# Patient Record
Sex: Male | Born: 1935 | Race: Black or African American | Hispanic: No | Smoking: Current every day smoker
Health system: Southern US, Community
[De-identification: ages and names within clinical notes are randomized; demographics above are authoritative.]

## PROBLEM LIST (undated history)

## (undated) DIAGNOSIS — E119 Type 2 diabetes mellitus without complications: Secondary | ICD-10-CM

## (undated) DIAGNOSIS — N183 Chronic kidney disease, stage 3 unspecified: Secondary | ICD-10-CM

## (undated) DIAGNOSIS — C61 Malignant neoplasm of prostate: Secondary | ICD-10-CM

## (undated) DIAGNOSIS — IMO0001 Reserved for inherently not codable concepts without codable children: Secondary | ICD-10-CM

## (undated) DIAGNOSIS — I219 Acute myocardial infarction, unspecified: Secondary | ICD-10-CM

## (undated) DIAGNOSIS — K219 Gastro-esophageal reflux disease without esophagitis: Secondary | ICD-10-CM

## (undated) DIAGNOSIS — I10 Essential (primary) hypertension: Secondary | ICD-10-CM

## (undated) DIAGNOSIS — I739 Peripheral vascular disease, unspecified: Secondary | ICD-10-CM

## (undated) DIAGNOSIS — E785 Hyperlipidemia, unspecified: Secondary | ICD-10-CM

## (undated) DIAGNOSIS — D649 Anemia, unspecified: Secondary | ICD-10-CM

## (undated) DIAGNOSIS — I509 Heart failure, unspecified: Secondary | ICD-10-CM

## (undated) DIAGNOSIS — R0603 Acute respiratory distress: Secondary | ICD-10-CM

## (undated) DIAGNOSIS — F319 Bipolar disorder, unspecified: Secondary | ICD-10-CM

## (undated) DIAGNOSIS — K922 Gastrointestinal hemorrhage, unspecified: Secondary | ICD-10-CM

## (undated) DIAGNOSIS — I2510 Atherosclerotic heart disease of native coronary artery without angina pectoris: Secondary | ICD-10-CM

## (undated) DIAGNOSIS — I161 Hypertensive emergency: Secondary | ICD-10-CM

## (undated) DIAGNOSIS — J811 Chronic pulmonary edema: Secondary | ICD-10-CM

## (undated) DIAGNOSIS — I639 Cerebral infarction, unspecified: Secondary | ICD-10-CM

## (undated) DIAGNOSIS — I4891 Unspecified atrial fibrillation: Secondary | ICD-10-CM

## (undated) HISTORY — DX: Gastro-esophageal reflux disease without esophagitis: K21.9

## (undated) HISTORY — DX: Malignant neoplasm of prostate: C61

## (undated) HISTORY — DX: Essential (primary) hypertension: I10

## (undated) HISTORY — DX: Type 2 diabetes mellitus without complications: E11.9

## (undated) HISTORY — DX: Acute myocardial infarction, unspecified: I21.9

## (undated) HISTORY — DX: Bipolar disorder, unspecified: F31.9

## (undated) HISTORY — DX: Hyperlipidemia, unspecified: E78.5

## (undated) HISTORY — DX: Atherosclerotic heart disease of native coronary artery without angina pectoris: I25.10

## (undated) HISTORY — DX: Peripheral vascular disease, unspecified: I73.9

---

## 2001-12-28 HISTORY — PX: FEMORAL BYPASS: SHX50

## 2003-12-07 ENCOUNTER — Inpatient Hospital Stay (HOSPITAL_COMMUNITY): Admission: EM | Admit: 2003-12-07 | Discharge: 2003-12-12 | Payer: Self-pay | Admitting: Emergency Medicine

## 2003-12-07 ENCOUNTER — Encounter: Payer: Self-pay | Admitting: Cardiology

## 2003-12-20 ENCOUNTER — Inpatient Hospital Stay (HOSPITAL_COMMUNITY): Admission: EM | Admit: 2003-12-20 | Discharge: 2003-12-24 | Payer: Self-pay | Admitting: Emergency Medicine

## 2004-12-08 ENCOUNTER — Emergency Department (HOSPITAL_COMMUNITY): Admission: EM | Admit: 2004-12-08 | Discharge: 2004-12-08 | Payer: Self-pay | Admitting: Emergency Medicine

## 2005-03-10 ENCOUNTER — Encounter: Admission: RE | Admit: 2005-03-10 | Discharge: 2005-06-08 | Payer: Self-pay | Admitting: Internal Medicine

## 2006-02-17 ENCOUNTER — Encounter: Admission: RE | Admit: 2006-02-17 | Discharge: 2006-05-18 | Payer: Self-pay | Admitting: Internal Medicine

## 2010-02-18 ENCOUNTER — Inpatient Hospital Stay (HOSPITAL_COMMUNITY): Admission: EM | Admit: 2010-02-18 | Discharge: 2010-02-20 | Payer: Self-pay | Admitting: Emergency Medicine

## 2010-02-18 ENCOUNTER — Ambulatory Visit: Payer: Self-pay | Admitting: Cardiology

## 2010-02-19 ENCOUNTER — Encounter: Payer: Self-pay | Admitting: Cardiology

## 2010-02-24 ENCOUNTER — Telehealth: Payer: Self-pay | Admitting: Cardiovascular Disease

## 2010-03-07 ENCOUNTER — Encounter: Payer: Self-pay | Admitting: Cardiovascular Disease

## 2010-03-10 ENCOUNTER — Telehealth: Payer: Self-pay | Admitting: Cardiovascular Disease

## 2010-03-10 DIAGNOSIS — I10 Essential (primary) hypertension: Secondary | ICD-10-CM

## 2010-03-10 DIAGNOSIS — I219 Acute myocardial infarction, unspecified: Secondary | ICD-10-CM | POA: Insufficient documentation

## 2010-03-10 DIAGNOSIS — I739 Peripheral vascular disease, unspecified: Secondary | ICD-10-CM

## 2010-03-10 DIAGNOSIS — E785 Hyperlipidemia, unspecified: Secondary | ICD-10-CM

## 2010-03-10 DIAGNOSIS — I2510 Atherosclerotic heart disease of native coronary artery without angina pectoris: Secondary | ICD-10-CM

## 2010-03-10 DIAGNOSIS — E1169 Type 2 diabetes mellitus with other specified complication: Secondary | ICD-10-CM

## 2010-03-10 DIAGNOSIS — K219 Gastro-esophageal reflux disease without esophagitis: Secondary | ICD-10-CM | POA: Insufficient documentation

## 2010-03-10 DIAGNOSIS — E1129 Type 2 diabetes mellitus with other diabetic kidney complication: Secondary | ICD-10-CM

## 2010-03-10 DIAGNOSIS — Z9861 Coronary angioplasty status: Secondary | ICD-10-CM

## 2010-03-11 ENCOUNTER — Ambulatory Visit: Payer: Self-pay | Admitting: Cardiovascular Disease

## 2010-03-13 ENCOUNTER — Telehealth: Payer: Self-pay | Admitting: Cardiovascular Disease

## 2010-03-18 ENCOUNTER — Encounter: Payer: Self-pay | Admitting: Cardiovascular Disease

## 2010-04-21 ENCOUNTER — Encounter: Payer: Self-pay | Admitting: Cardiovascular Disease

## 2010-05-06 ENCOUNTER — Encounter: Payer: Self-pay | Admitting: Cardiovascular Disease

## 2010-06-04 ENCOUNTER — Encounter: Payer: Self-pay | Admitting: Cardiovascular Disease

## 2011-01-29 NOTE — Progress Notes (Signed)
Summary: Need order for PT  Phone Note From Other Clinic Call back at [phone]   Caller: Reinaldo Berber / Genevieve Norlander Reason for Call: Need Referral Information Request: Talk with Nurse, Talk with Provider Summary of Call: Pt is being followed by [facility] home care and Genevieve Norlander got a report from [facility] stating the patient is unstable and needs PT and Genevieve Norlander  just need an order faxed to them in order to start the process Initial call taken by: Omer Jack,  March 10, 2010 4:22 PM  Follow-up for Phone Call        Rapid referral form for PT completed and faxed.  Follow-up by: Julieta Gutting, RN, BSN,  March 11, 2010 11:29 AM

## 2011-01-29 NOTE — Progress Notes (Signed)
Summary: Records  Phone Note Call from Patient Call back at Home Phone [phone]   Caller: Patient Reason for Call: Talk to Nurse Summary of Call: wants to know if he could get a aid @ home, wants to make sure copy of procedure is sent to PCP in [place] Dr Orson Aloe [phone] ext [phone] Initial call taken by: Migdalia Dk,  February 24, 2010 11:07 AM  Follow-up for Phone Call        I spoke with the pt and he is looking for assistance with housework, heavy lifting and cooking.  I told the pt that the PCP should make this referral is he needs help with his ADLs.  The pt said the PCP is already working on this request.  I also offered the pt information about [facility].  The pt said he would give them a call to see what they offer. Follow-up by: Julieta Gutting, RN, BSN,  February 24, 2010 5:55 PM  Additional Follow-up for Phone Call Additional follow up Details #1::        I obtained the fax # for Dr Orson Aloe [phone].  Hospital records faxed to the [facility] per pt request. Julieta Gutting, RN, BSN  February 25, 2010 12:11 PM

## 2011-01-29 NOTE — Progress Notes (Signed)
Summary: medication questions  Phone Note Call from Patient Call back at Home Phone [phone]   Caller: Patient Reason for Call: Talk to Nurse Summary of Call: pt needs to how long he needs to meds. [facility]. OK to leave message on machine if not home. Initial call taken by: Edman Circle,  March 13, 2010 2:53 PM  Follow-up for Phone Call        talked with pt--he asked how long he is to continue Plavix--I told pt he needs to continue Plavix for at least one year  and discuss with Dr Excell Seltzer at time of appt in one year about continuing Plavix beyond one year--I told pt not to stop Plavix without discussing with Dr Excell Seltzer

## 2011-01-29 NOTE — Assessment & Plan Note (Signed)
Summary: EPH   Visit Type:  Post-hospital  CC:  No complaints.  History of Present Illness: 75 year-old male with coronary and peripheral arterial disease, presenting for hospital f/u evaluation. He was hospitalized in February 2011 with a NSTEMI and underwent PCI of the distal left circumflex swith an Endeavor DES. He has done well since hospital discharge and denies any further chest pain. Denies dyspnea, orthopnea, or PND. He has an upcoming appt at the [facility], where he receives most of his medical care.  Current Medications (verified): 1)  Opt Ammonium Lactate 12% Lotion .... Qpply Once A Day 2)  Aspirin 81 Mg Tbec (Aspirin) .... Take One Tablet By Mouth Daily 3)  Cilostazol 50 Mg Tabs (Cilostazol) .... Take 1 Tablet By Mouth Two Times A Day 4)  Vitamin B-12 1000 Mcg Tabs (Cyanocobalamin) .... Take 1 Tablet By Mouth Once A Day 5)  Divalproex Sodium 250 Mg Tbec (Divalproex Sodium) .... 6 Tablets Every Day 6)  Finasteride 5 Mg Tabs (Finasteride) .... Take 1 Tablet By Mouth Once A Day 7)  Novolin 70/30 70-30 % Susp (Insulin Isophane & Regular) .... As Directed 8)  Lisinopril 40 Mg Tabs (Lisinopril) .... Take 1/2 Tablet Daily 9)  Daily Multiple Vitamins  Tabs (Multiple Vitamin) .... Take 1 Tablet By Mouth Once A Day 10)  Nitroglycerin 0.4 Mg/hr Pt24 (Nitroglycerin) .... Apply 1 Patch Each Morning and Remove At Bedtime 11)  Omeprazole 20 Mg Tbec (Omeprazole) .... Take 1 Tablet By Mouth Once A Day 12)  Levatol 20 Mg Tabs (Penbutolol Sulfate) .... Take 1 Tablet By Mouth Two Times A Day 13)  Simvastatin 80 Mg Tabs (Simvastatin) .... Take  1/2 Tablet By Mouth Daily At Bedtime 14)  Tamsulosin Hcl 0.4 Mg Caps (Tamsulosin Hcl) .... Take 1 Capsule By Mouth Once A Day 15)  Travatan Z 0.004 % Soln (Travoprost) .... One Drop Each Eye At Bedtime 16)  Plavix 75 Mg Tabs (Clopidogrel Bisulfate) .... Take One Tablet By Mouth Daily  Allergies (verified): 1)  ! Penicillin  Past History:  Past  medical history reviewed for relevance to current acute and chronic problems.  Past Medical History: Current Problems:  HYPERLIPIDEMIA (ICD-272.4) HYPERTENSION (ICD-401.9) CAD (ICD-414.00)Non Q-wave myocardial infarction (with history of coronary artery disease), s/p PCI Feb 2011 with a DES. MYOCARDIAL INFARCTION (ICD-410.90) PVD (ICD-443.9) DM (ICD-250.00) GERD (ICD-530.81) Bipolar disorder Prostate cancer, status post radiation therapy in 2003.   Review of Systems       Positive for right groin pain with ambulation, otherwise negative except as per HPI.  Vital Signs:  Patient profile:   75 year old male Height:      71 inches Weight:      229.50 pounds BMI:     32.12 Pulse rate:   80 / minute Pulse rhythm:   irregular Resp:     18 per minute BP sitting:   128 / 80  (left arm) Cuff size:   large  Vitals Entered By: Vikki Ports (March 11, 2010 9:47 AM)  Physical Exam  General:  Pt is alert and oriented, in no acute distress. HEENT: normal Neck: normal carotid upstrokes without bruits, JVP normal Lungs: CTA CV: RRR without murmur or gallop Abd: soft, NT, positive BS, no bruit, no organomegaly Ext: no clubbing, cyanosis, or edema.Fem pulses 2+= bilateral, pedal pulses diminished. R groin nontender. R radial site intact with positive reverse Allen's Skin: warm and dry without rash    EKG  Procedure date:  03/11/2010  Findings:  NSR with RBBB, T wave abnormality consider inferior ischemia, HR 79 bpm.  Impression & Recommendations:  Problem # 1:  MYOCARDIAL INFARCTION (ICD-410.90) Pt s/p NSTEMI last month, treated with DES in the left circumflex. Recommend continue ASA/Plavix minimum of 12 months. Asked him to increase his ASA dose to 325 mg for the next 2 months, then resume 81 mg long-term. Also asked that he substitute Protonix for Omeprazole because of concern over plavix interaction. He is followed regularly at the [facility], and I advised that I would see him  on a yearly basis so that he has cardiac f/u locally in [place] as well.  His updated medication list for this problem includes:    Aspirin Ec 325 Mg Tbec (Aspirin) .Marland Kitchen... Take one tablet by mouth daily    Cilostazol 50 Mg Tabs (Cilostazol) .Marland Kitchen... Take 1 tablet by mouth two times a day    Lisinopril 40 Mg Tabs (Lisinopril) .Marland Kitchen... Take 1/2 tablet daily    Nitroglycerin 0.4 Mg/hr Pt24 (Nitroglycerin) .Marland Kitchen... Apply 1 patch each morning and remove at bedtime    Levatol 20 Mg Tabs (Penbutolol sulfate) .Marland Kitchen... Take 1 tablet by mouth two times a day    Plavix 75 Mg Tabs (Clopidogrel bisulfate) .Marland Kitchen... Take one tablet by mouth daily  Problem # 2:  HYPERTENSION (ICD-401.9) Controlled on current Rx.  His updated medication list for this problem includes:    Aspirin Ec 325 Mg Tbec (Aspirin) .Marland Kitchen... Take one tablet by mouth daily    Lisinopril 40 Mg Tabs (Lisinopril) .Marland Kitchen... Take 1/2 tablet daily    Levatol 20 Mg Tabs (Penbutolol sulfate) .Marland Kitchen... Take 1 tablet by mouth two times a day  Orders: EKG w/ Interpretation (93000)  BP today: 128/80  Problem # 3:  HYPERLIPIDEMIA (ICD-272.4) followed at the [facility]  His updated medication list for this problem includes:    Simvastatin 80 Mg Tabs (Simvastatin) .Marland Kitchen... Take  1/2 tablet by mouth daily at bedtime  Orders: EKG w/ Interpretation (93000)  Problem # 4:  PVD (ICD-443.9) Right groin pain appears nonvascular in nature. He is followed by vascular surgery at the [place]. No further workup at present.  Patient Instructions: 1)  Your physician has recommended you make the following change in your medication: INCREASE ASPIRIN to 325mg  once a day for 2 MONTHS, then decrease to ASPIRIN 81mg  once a day.  STOP Omeprazole, START Pantoprazole 40mg  once a day 2)  Your physician wants you to follow-up in:   1 YEAR. You will receive a reminder letter in the mail two months in advance. If you don't receive a letter, please call our office to schedule the follow-up appointment.

## 2011-01-29 NOTE — Letter (Signed)
Summary: Home Health Cert and Plan of Care  Home Health Cert and Plan of Care   Imported By: Marylou Mccoy 05/16/2010 14:10:07  _____________________________________________________________________  External Attachment:    Type:   Image     Comment:   External Document

## 2011-01-29 NOTE — Miscellaneous (Signed)
Summary: Revision To Plan of Care   Revision To Plan of Care   Imported By: Roderic Ovens 05/12/2010 13:15:00  _____________________________________________________________________  External Attachment:    Type:   Image     Comment:   External Document

## 2011-01-29 NOTE — Letter (Signed)
Summary: [facility] note  Architectural technologist, Main Office  [address]   [place], [place] [address]   Phone: [phone]  Fax: [phone]        March 11, 2010 MRN: [id number]    [name] [address] [place], [place]  [address]    To Whom it May Concern:  This patient has Acute Coronary Syndrome and had a drug-eluting stent placed on 02/19/10.  This patient requires Plavix for treatment of his ACS.  The patient requires Pantoprazole 40mg  daily to treat his GERD while taking Plavix, because the potential interaction between Omeprazole and Plavix.      Sincerely,   Dr Tonny Bollman   This letter has been electronically signed by your physician.

## 2011-01-29 NOTE — Letter (Signed)
Summary: [facility] Request for Independent Assessment for Personal Care Svcs  [facility] Request for Independent Assessment for Personal Care Svcs   Imported By: Roderic Ovens 04/14/2010 16:20:01  _____________________________________________________________________  External Attachment:    Type:   Image     Comment:   External Document

## 2011-01-29 NOTE — Miscellaneous (Signed)
Summary: [facility] Revision to Plan of Care  [facility] Revision to Plan of Care   Imported By: Roderic Ovens 04/29/2010 13:28:21  _____________________________________________________________________  External Attachment:    Type:   Image     Comment:   External Document

## 2011-01-29 NOTE — Letter (Signed)
Summary: [name] - Home Health Rapid Referral  [facility] Health Rapid Referral   Imported By: Marylou Mccoy 06/24/2010 16:49:26  _____________________________________________________________________  External Attachment:    Type:   Image     Comment:   External Document

## 2011-01-29 NOTE — Miscellaneous (Signed)
Summary: [facility] Revision to Care Plan   [facility] Revision to Care Plan   Imported By: Roderic Ovens 06/18/2010 16:17:17  _____________________________________________________________________  External Attachment:    Type:   Image     Comment:   External Document

## 2011-03-16 ENCOUNTER — Encounter: Payer: Self-pay | Admitting: Cardiovascular Disease

## 2011-03-16 ENCOUNTER — Ambulatory Visit (INDEPENDENT_AMBULATORY_CARE_PROVIDER_SITE_OTHER): Payer: Medicare Other | Admitting: Cardiovascular Disease

## 2011-03-16 DIAGNOSIS — I10 Essential (primary) hypertension: Secondary | ICD-10-CM

## 2011-03-16 DIAGNOSIS — I2510 Atherosclerotic heart disease of native coronary artery without angina pectoris: Secondary | ICD-10-CM

## 2011-03-18 LAB — CARDIAC PANEL(CRET KIN+CKTOT+MB+TROPI)
CK, MB: 17 ng/mL (ref 0.3–4.0)
CK, MB: 28.8 ng/mL (ref 0.3–4.0)
Relative Index: 4.6 — ABNORMAL HIGH (ref 0.0–2.5)
Total CK: 458 U/L — ABNORMAL HIGH (ref 7–232)
Total CK: 629 U/L — ABNORMAL HIGH (ref 7–232)
Troponin I: 26.6 ng/mL (ref 0.00–0.06)
Troponin I: 34.7 ng/mL (ref 0.00–0.06)

## 2011-03-18 LAB — CBC
HCT: 38.6 % — ABNORMAL LOW (ref 39.0–52.0)
HCT: 47.3 % (ref 39.0–52.0)
Hemoglobin: 12.8 g/dL — ABNORMAL LOW (ref 13.0–17.0)
Hemoglobin: 15.9 g/dL (ref 13.0–17.0)
MCHC: 33.3 g/dL (ref 30.0–36.0)
MCHC: 33.5 g/dL (ref 30.0–36.0)
MCV: 86.4 fL (ref 78.0–100.0)
Platelets: 174 10*3/uL (ref 150–400)
Platelets: 175 10*3/uL (ref 150–400)
RBC: 4.44 MIL/uL (ref 4.22–5.81)
RBC: 5.48 MIL/uL (ref 4.22–5.81)
RDW: 14.2 % (ref 11.5–15.5)
RDW: 14.2 % (ref 11.5–15.5)
RDW: 14.4 % (ref 11.5–15.5)
WBC: 8.6 10*3/uL (ref 4.0–10.5)

## 2011-03-18 LAB — POCT I-STAT, CHEM 8
BUN: 10 mg/dL (ref 6–23)
Calcium, Ion: 1.1 mmol/L — ABNORMAL LOW (ref 1.12–1.32)
Chloride: 97 mEq/L (ref 96–112)
Creatinine, Ser: 1.3 mg/dL (ref 0.4–1.5)
Glucose, Bld: 245 mg/dL — ABNORMAL HIGH (ref 70–99)
HCT: 52 % (ref 39.0–52.0)
Hemoglobin: 17.7 g/dL — ABNORMAL HIGH (ref 13.0–17.0)
Potassium: 3.8 mEq/L (ref 3.5–5.1)
Sodium: 136 mEq/L (ref 135–145)
TCO2: 35 mmol/L (ref 0–100)

## 2011-03-18 LAB — GLUCOSE, CAPILLARY
Glucose-Capillary: 150 mg/dL — ABNORMAL HIGH (ref 70–99)
Glucose-Capillary: 211 mg/dL — ABNORMAL HIGH (ref 70–99)
Glucose-Capillary: 230 mg/dL — ABNORMAL HIGH (ref 70–99)
Glucose-Capillary: 258 mg/dL — ABNORMAL HIGH (ref 70–99)
Glucose-Capillary: 264 mg/dL — ABNORMAL HIGH (ref 70–99)
Glucose-Capillary: 295 mg/dL — ABNORMAL HIGH (ref 70–99)

## 2011-03-18 LAB — BASIC METABOLIC PANEL
BUN: 16 mg/dL (ref 6–23)
Chloride: 98 mEq/L (ref 96–112)
Creatinine, Ser: 1.76 mg/dL — ABNORMAL HIGH (ref 0.4–1.5)
GFR calc Af Amer: 55 mL/min — ABNORMAL LOW (ref >60)
GFR calc non Af Amer: 38 mL/min — ABNORMAL LOW (ref >60)
GFR calc non Af Amer: 46 mL/min — ABNORMAL LOW (ref >60)
Glucose, Bld: 181 mg/dL — ABNORMAL HIGH (ref 70–99)
Glucose, Bld: 201 mg/dL — ABNORMAL HIGH (ref 70–99)
Potassium: 3.5 mEq/L (ref 3.5–5.1)
Sodium: 136 mEq/L (ref 135–145)

## 2011-03-18 LAB — TROPONIN I
Troponin I: 44.71 ng/mL (ref 0.00–0.06)
Troponin I: 45.95 ng/mL (ref 0.00–0.06)

## 2011-03-18 LAB — COMPREHENSIVE METABOLIC PANEL
ALT: 20 U/L (ref 0–53)
AST: 107 U/L — ABNORMAL HIGH (ref 0–37)
Calcium: 8.4 mg/dL (ref 8.4–10.5)
GFR calc Af Amer: 57 mL/min — ABNORMAL LOW (ref >60)
Sodium: 136 mEq/L (ref 135–145)
Total Protein: 6.4 g/dL (ref 6.0–8.3)

## 2011-03-18 LAB — DIFFERENTIAL
Basophils Absolute: 0 10*3/uL (ref 0.0–0.1)
Basophils Relative: 0 % (ref 0–1)
Eosinophils Absolute: 0.1 10*3/uL (ref 0.0–0.7)
Eosinophils Relative: 1 % (ref 0–5)
Lymphocytes Relative: 18 % (ref 12–46)
Lymphs Abs: 1.6 10*3/uL (ref 0.7–4.0)
Monocytes Absolute: 1.4 10*3/uL — ABNORMAL HIGH (ref 0.1–1.0)
Monocytes Relative: 16 % — ABNORMAL HIGH (ref 3–12)
Neutro Abs: 5.5 10*3/uL (ref 1.7–7.7)
Neutrophils Relative %: 64 % (ref 43–77)

## 2011-03-18 LAB — CK TOTAL AND CKMB (NOT AT ARMC)
CK, MB: 61 ng/mL (ref 0.3–4.0)
CK, MB: 69.8 ng/mL (ref 0.3–4.0)
Relative Index: 6.4 — ABNORMAL HIGH (ref 0.0–2.5)
Relative Index: 6.6 — ABNORMAL HIGH (ref 0.0–2.5)
Total CK: 1054 U/L — ABNORMAL HIGH (ref 7–232)
Total CK: 955 U/L — ABNORMAL HIGH (ref 7–232)

## 2011-03-18 LAB — PROTIME-INR
INR: 1.16 (ref 0.00–1.49)
Prothrombin Time: 14.7 seconds (ref 11.6–15.2)
Prothrombin Time: 15.8 seconds — ABNORMAL HIGH (ref 11.6–15.2)

## 2011-03-18 LAB — POCT CARDIAC MARKERS
CKMB, poc: 36 ng/mL (ref 1.0–8.0)
Myoglobin, poc: 276 ng/mL (ref 12–200)
Troponin i, poc: 25.3 ng/mL (ref 0.00–0.09)

## 2011-03-18 LAB — MRSA PCR SCREENING: MRSA by PCR: NEGATIVE

## 2011-03-18 LAB — HEPARIN LEVEL (UNFRACTIONATED): Heparin Unfractionated: 0.22 IU/mL — ABNORMAL LOW (ref 0.30–0.70)

## 2011-03-18 LAB — LIPID PANEL
HDL: 57 mg/dL (ref >39)
LDL Cholesterol: 55 mg/dL (ref 0–99)
Triglycerides: 82 mg/dL (ref <150)
VLDL: 16 mg/dL (ref 0–40)

## 2011-03-18 LAB — APTT: aPTT: 95 seconds — ABNORMAL HIGH (ref 24–37)

## 2011-03-18 LAB — TSH: TSH: 0.986 u[IU]/mL (ref 0.350–4.500)

## 2011-03-31 NOTE — Assessment & Plan Note (Signed)
Summary: F1Y/FROM 03/11/10/LWB/PER PT CALL-MJ/SAF   Visit Type:  Follow-up  CC:  pt will call us in regards to  his amlodipine dosage.  History of Present Illness: 75 year-old male with coronary and peripheral arterial disease, presenting for f/u evaluation. He was hospitalized in February 2011 with a NSTEMI and underwent PCI of the distal left circumflex with an Endeavor DES. He receives most of his medical care through the [name].  He denies chest pain or dyspnea. No lightheadedness, syncope, or palpitations. He reports compliance with his medical program. No complaints today.  Current Medications (verified): 1)  Opt Ammonium Lactate 12% Lotion .... Qpply Once A Day 2)  Aspirin Ec 325 Mg Tbec (Aspirin) .... Take One Tablet By Mouth Daily 3)  Cilostazol 50 Mg Tabs (Cilostazol) .... Take 1 Tablet By Mouth Two Times A Day 4)  Vitamin B-12 1000 Mcg Tabs (Cyanocobalamin) .... Take 1 Tablet By Mouth Once A Day 5)  Divalproex Sodium 250 Mg Tbec (Divalproex Sodium) .... 6 Tablets Every Day 6)  Finasteride 5 Mg Tabs (Finasteride) .... Take 1 Tablet By Mouth Once A Day 7)  Novolin 70/30 70-30 % Susp (Insulin Isophane & Regular) .... As Directed 8)  Lisinopril 40 Mg Tabs (Lisinopril) .... Take 1/2 Tablet Daily 9)  Daily Multiple Vitamins  Tabs (Multiple Vitamin) .... Take 1 Tablet By Mouth Once A Day 10)  Nitroglycerin 0.4 Mg/hr Pt24 (Nitroglycerin) .... Apply 1 Patch Each Morning and Remove At Bedtime 11)  Pantoprazole Sodium 40 Mg Tbec (Pantoprazole Sodium) .... Take One Tablet By Mouth Daily 12)  Levatol 20 Mg Tabs (Penbutolol Sulfate) .... Take 1 Tablet By Mouth Two Times A Day 13)  Simvastatin 80 Mg Tabs (Simvastatin) .... Take  1/2 Tablet By Mouth Daily At Bedtime 14)  Tamsulosin Hcl 0.4 Mg Caps (Tamsulosin Hcl) .... Take 1 Capsule By Mouth Once A Day 15)  Travatan Z 0.004 % Soln (Travoprost) .... One Drop Each Eye At Bedtime 16)  Plavix 75 Mg Tabs (Clopidogrel Bisulfate) .... Take One  Tablet By Mouth Daily 17)  Amlodipine .... Take One Tablet By Mouth Daily  Allergies: 1)  ! Penicillin  Past History:  Past medical history reviewed for relevance to current acute and chronic problems.  Past Medical History: Reviewed history from 03/11/2010 and no changes required. Current Problems:  HYPERLIPIDEMIA (ICD-272.4) HYPERTENSION (ICD-401.9) CAD (ICD-414.00)Non Q-wave myocardial infarction (with history of coronary artery disease), s/p PCI Feb 2011 with a DES. MYOCARDIAL INFARCTION (ICD-410.90) PVD (ICD-443.9) DM (ICD-250.00) GERD (ICD-530.81) Bipolar disorder Prostate cancer, status post radiation therapy in 2003.   Review of Systems       Negative except as per HPI   Vital Signs:  Patient profile:   75 year old male Height:      71 inches Weight:      230 pounds BMI:     32.19 Pulse rate:   67 / minute Resp:     18 per minute BP sitting:   125 / 74  (left arm)  Vitals Entered By: Kem Parkinson (March 16, 2011 12:13 PM)  Physical Exam  General:  Pt is alert and oriented, in no acute distress. HEENT: normal Neck: normal carotid upstrokes without bruits, JVP normal Lungs: CTA CV: RRR without murmur or gallop Abd: soft, NT, positive BS, no bruit, no organomegaly Ext: no edema Skin: warm and dry without rash    EKG  Procedure date:  03/16/2011  Findings:      NSR with RBBB  Impression &  Recommendations:  Problem # 1:  CAD (ICD-414.00) Pt stable without angina. He is tolerating antiplatelet Rx without bleeding problems. Continue current medical program.  His updated medication list for this problem includes:    Aspirin Ec 325 Mg Tbec (Aspirin) .Marland Kitchen... Take one tablet by mouth daily    Cilostazol 50 Mg Tabs (Cilostazol) .Marland Kitchen... Take 1 tablet by mouth two times a day    Lisinopril 40 Mg Tabs (Lisinopril) .Marland Kitchen... Take 1/2 tablet daily    Nitroglycerin 0.4 Mg/hr Pt24 (Nitroglycerin) .Marland Kitchen... Apply 1 patch each morning and remove at bedtime    Levatol  20 Mg Tabs (Penbutolol sulfate) .Marland Kitchen... Take 1 tablet by mouth two times a day    Plavix 75 Mg Tabs (Clopidogrel bisulfate) .Marland Kitchen... Take one tablet by mouth daily  Problem # 2:  HYPERTENSION (ICD-401.9) BP controlled.  His updated medication list for this problem includes:    Aspirin Ec 325 Mg Tbec (Aspirin) .Marland Kitchen... Take one tablet by mouth daily    Lisinopril 40 Mg Tabs (Lisinopril) .Marland Kitchen... Take 1/2 tablet daily    Levatol 20 Mg Tabs (Penbutolol sulfate) .Marland Kitchen... Take 1 tablet by mouth two times a day  BP today: 125/74 Prior BP: 128/80 (03/11/2010)  Problem # 3:  HYPERLIPIDEMIA (ICD-272.4) His lipids are followed through the [place]. We need to reduce his simvastatin dose if he is on amlodipine...will followup on this.  His updated medication list for this problem includes:    Simvastatin 80 Mg Tabs (Simvastatin) .Marland Kitchen... Take  1/2 tablet by mouth daily at bedtime  Patient Instructions: 1)  Your physician recommends that you continue on your current medications as directed. Please refer to the Current Medication list given to you today. 2)  Your physician wants you to follow-up in: ONE YEAR  You will receive a reminder letter in the mail two months in advance. If you don't receive a letter, please call our office to schedule the follow-up appointment.  Appended Document: F1Y/FROM 03/11/10/LWB/PER PT CALL-MJ/SAF Lauren - can you let him know he needs to cut simvastatin to 20 mg since he's on amlodipine? I think Shelby Dubin follows him.

## 2011-04-15 ENCOUNTER — Telehealth: Payer: Self-pay | Admitting: Cardiovascular Disease

## 2011-04-15 NOTE — Telephone Encounter (Signed)
I spoke with the pt and he did contact his PCP and discussed his symptoms with them and they did not feel that it was related to his diabetes or timing of insulin dose.  The PCP instructed the pt to contact our office for an appointment.  The pt has noticed for the last month that he develops reflux symptoms every evening after his meal and he takes antacid with relief.  Last Thursday the pt developed CP and diaphoresis mid morning and took antacid.  The pt said his symptoms resolved about an hour later.  The pt does have a supply of NTG but has not used this for symptoms. I tried to arrange an appt for the pt to see the PA on 04/16/11 but this did not work for his schedule.  The pt has an appt on 04/20/11 at 12:00  with Tereso Newcomer PA-C.  The pt was instructed to use NTG for chest pain and go to the ER if he developed worsening in his symptoms.  Pt agreed with plan.

## 2011-04-17 ENCOUNTER — Encounter: Payer: Self-pay | Admitting: Physician Assistant

## 2011-04-20 ENCOUNTER — Encounter: Payer: Self-pay | Admitting: Physician Assistant

## 2011-04-20 ENCOUNTER — Ambulatory Visit (INDEPENDENT_AMBULATORY_CARE_PROVIDER_SITE_OTHER): Payer: Medicare Other | Admitting: Physician Assistant

## 2011-04-20 VITALS — BP 125/63 | HR 64 | Ht 73.0 in | Wt 238.0 lb

## 2011-04-20 DIAGNOSIS — I2510 Atherosclerotic heart disease of native coronary artery without angina pectoris: Secondary | ICD-10-CM

## 2011-04-20 DIAGNOSIS — E785 Hyperlipidemia, unspecified: Secondary | ICD-10-CM

## 2011-04-20 DIAGNOSIS — K219 Gastro-esophageal reflux disease without esophagitis: Secondary | ICD-10-CM

## 2011-04-20 DIAGNOSIS — R079 Chest pain, unspecified: Secondary | ICD-10-CM | POA: Insufficient documentation

## 2011-04-20 MED ORDER — PANTOPRAZOLE SODIUM 40 MG PO TBEC: 40.0000 mg | DELAYED_RELEASE_TABLET | Freq: Two times a day (BID) | ORAL | Status: DC

## 2011-04-20 NOTE — Progress Notes (Signed)
Addended by: Danielle Rankin on: 04/20/2011 02:39 PM   Modules accepted: Orders

## 2011-04-20 NOTE — Assessment & Plan Note (Signed)
Symptoms are somewhat atypical.  He denies exertional symptoms.  They sound more consistent with acid reflux symptoms than anything else.  He also sounds like he had a hypoglycemic episode as well last week.  His ECG is unchanged.  However, he is now off Plavix.  He also is high risk for progressive CAD with continued smoking and diabetes.  I will arrange for him to have a Lexiscan Myoview.  I will increase his Protonix to BID.  He will follow up with Dr. Excell Seltzer in 3 weeks.  If his Myoview is low risk, I suggest he seek further evaluation with GI.

## 2011-04-20 NOTE — Assessment & Plan Note (Signed)
We will have patient check his medications to make sure he is not taking more than Simvastatin 20 mg qhs with concomitant use of amlodipine.

## 2011-04-20 NOTE — Assessment & Plan Note (Signed)
Schedule Myoview as noted.  He is no longer on Plavix.  He has a new generation stent and has completed one year of dual antiplatelet therapy.  He should be ok to remain off Plavix and continue ASA.  I will check with Dr. Excell Seltzer to see if he would rather he stay on Plavix.

## 2011-04-20 NOTE — Progress Notes (Signed)
History of Present Illness: Primary Cardiologist: Dr. Tonny Bollman  [name] is a 75 y.o. male with a h/o CAD, s/p NSTEMI 2/11 treated with an Endeavor DES to the OM2, preserved LVF with EF 50-55%, severe LVH and diastolic dysfunction, inf. HK and LAE by echo in 2/11, PAD, s/p left fem-pop bypass, DM2, HTN, hyperlipidemia and prostate CA, s/p radiation therapy in 2003.  He receives most of his care at the [facility].  He presents for chest pain.  Last week, he took his AM Lantus insulin and sat down to eat.  He developed chest pain, weakness and diaphoresis.  This resolved with eating.  He had checked his sugar an hour before and it was 150.  He did not repeat his sugar.  He took some TUMS that helped his chest pain as well.  He does note a recent h/o chest pain described as gas that resolves with TUMS.  He had an EGD at the [facility] last year and did not require dilatation.  He is on Protonix once daily.  He denies any exertional chest pain or dyspnea.  He has chronic leg pain with walking.  He denies orthopnea or PND.  He has chronic pedal edema without change.  He denies syncope.  Of note, he did stop taking Plavix in March.  He is not aware of these symptoms being related to stopping his Plavix.    Past Medical History  Diagnosis Date  . Hyperlipidemia   . HTN (hypertension)     echo 2/11: EF 50-55%, diast dyfxn, severe LVH, inf HK, LAE  . CAD (coronary artery disease)     a.  NSTEMI treated with PCI Feb 2011 with a DES.(Endeavor);   b. cath 2/11: D1 stents x 2 ok, AV groove CFX occluded with dist AV CFX filled by L-L collats, RCA occluded, OM2 90-95% (treated with PCI)  . MI (myocardial infarction)   . PVD (peripheral vascular disease)   . DM (diabetes mellitus)   . GERD (gastroesophageal reflux disease)   . Bipolar disorder   . Prostate cancer     status post radiation therapy in 2003    Current Outpatient Prescriptions  Medication Sig Dispense Refill  . AMLODIPINE BESYLATE PO  Take one tablet by mouth daily       . ammonium lactate (AMLACTIN) 12 % cream Apply Once a Day       . aspirin 325 MG EC tablet Take 325 mg by mouth daily.        . cilostazol (PLETAL) 50 MG tablet Take 50 mg by mouth 2 (two) times daily.        . Cyanocobalamin (VITAMIN B 12 PO) Take 1 tablet by mouth daily. 1000 MCG       . DAILY MULTIPLE VITAMINS PO Take 1 tablet by mouth once a day.       . divalproex (DEPAKOTE) 250 MG EC tablet 6 tablets everyday.       . finasteride (PROSCAR) 5 MG tablet Take 5 mg by mouth daily.        . insulin NPH-insulin regular (NOVOLIN 70/30) (70-30) 100 UNIT/ML injection As Directed       . lisinopril (PRINIVIL,ZESTRIL) 40 MG tablet Take half a tablet daily.       . nitroGLYCERIN (NITRODUR - DOSED IN MG/24 HR) 0.4 mg/hr Apply 1 patch each morning and remove at bedtime.       . pantoprazole (PROTONIX) 40 MG tablet Take 1 tablet (40 mg total)  by mouth 2 (two) times daily.  60 tablet  11  . penbutolol (LEVATOL) 20 MG TABS Take 20 mg by mouth 2 (two) times daily.        . simvastatin (ZOCOR) 80 MG tablet Take half a tablet by mouth daily at bedtime.       . Tamsulosin HCl (FLOMAX) 0.4 MG CAPS Take 0.4 mg by mouth daily.        . travoprost, benzalkonium, (TRAVATAN) 0.004 % ophthalmic solution Place 1 drop into both eyes at bedtime.        Marland Kitchen DISCONTD: pantoprazole (PROTONIX) 40 MG tablet Take 40 mg by mouth daily.        . clopidogrel (PLAVIX) 75 MG tablet Take 75 mg by mouth daily.          Allergies  Allergen Reactions  . Penicillins     REACTION: hands and feet edema   History  Substance Use Topics  . Smoking status: Current Everyday Smoker -- 0.5 packs/day for 50 years    Types: Cigarettes  . Smokeless tobacco: Not on file   Comment: He has a 50-pack-year hx of tobacco abuse. Currently, smoking about half a pack a day.  . Alcohol Use: Yes     Previously drank heavily, and now has a drink every other day or so.    ROS:  Please see HPI.  No fevers or  chills.  No melena or hematochezia.  No dysphagia.  No nausea or vomiting.  All other systems reviewed and negative.  Vital Signs: BP 125/63  Pulse 64  Ht 6\' 1"  (1.854 m)  Wt 238 lb (107.956 kg)  BMI 31.40 kg/m2  PHYSICAL EXAM: Well nourished, well developed, in no acute distress HEENT: normal Neck: no JVD Cardiac:  normal S1, S2; RRR; no murmur Lungs:  Decreased breath sounds bilaterally, no wheezing, rhonchi or rales Abd: soft, nontender, no hepatomegaly Ext: 1+ bilateral edema Skin: warm and dry Neuro:  CNs 2-12 intact, no focal abnormalities noted  EKG:  NSR, HR 68, RBBB, no significant change since previous tracing  ASSESSMENT AND PLAN:

## 2011-04-20 NOTE — Patient Instructions (Addendum)
Your physician has requested that you have a lexiscan myoview 786.50. For further information please visit [email or web address] Please follow instruction sheet, as given.  Your physician has recommended you make the following change in your medication: INCREASE PROTON IX 40 TO TWICE DAILY  PLEASE CALL OUR OFFICE TO LET us KNOW WHAT THE DOSE OF YOUR SIMVASTATIN AND AMLODIPINE ARE; YOU CANNOT TAKE MORE THAN 20 MG OF SIMVASTATIN SINCE YOU ARE AMLODIPINE [phone] SCOTT WEAVER, PA-C   Your physician recommends that you schedule a follow-up appointment in: 3 WEEKS WITH DR. Excell Seltzer

## 2011-04-20 NOTE — Assessment & Plan Note (Signed)
As noted, increase Protonix to BID.

## 2011-04-28 ENCOUNTER — Ambulatory Visit (HOSPITAL_COMMUNITY): Payer: PRIVATE HEALTH INSURANCE | Attending: Cardiovascular Disease | Admitting: Radiology

## 2011-04-28 DIAGNOSIS — R0602 Shortness of breath: Secondary | ICD-10-CM

## 2011-04-28 DIAGNOSIS — R0789 Other chest pain: Secondary | ICD-10-CM

## 2011-04-28 DIAGNOSIS — R079 Chest pain, unspecified: Secondary | ICD-10-CM | POA: Insufficient documentation

## 2011-04-28 DIAGNOSIS — I2510 Atherosclerotic heart disease of native coronary artery without angina pectoris: Secondary | ICD-10-CM | POA: Insufficient documentation

## 2011-04-28 MED ORDER — TECHNETIUM TC 99M TETROFOSMIN IV KIT
33.0000 | PACK | Freq: Once | INTRAVENOUS | Status: AC | PRN
  Administered 2011-04-28: 33 via INTRAVENOUS

## 2011-04-28 MED ORDER — TECHNETIUM TC 99M TETROFOSMIN IV KIT
11.0000 | PACK | Freq: Once | INTRAVENOUS | Status: AC | PRN
  Administered 2011-04-28: 11 via INTRAVENOUS

## 2011-04-28 MED ORDER — REGADENOSON 0.4 MG/5ML IV SOLN
0.4000 mg | Freq: Once | INTRAVENOUS | Status: AC
  Administered 2011-04-28: 0.4 mg via INTRAVENOUS

## 2011-04-28 NOTE — Progress Notes (Signed)
[facility] SITE 3 NUCLEAR MED [address] [place] [address] [phone]  Cardiology Nuclear Med Study  [name] is a 75 y.o. male [id number] 1936-06-08   Nuclear Med Background Indication for Stress Test:  Evaluation for Ischemia, PTCA/Stent Patency History:  2/11 NSTEMI>PTCA/Stent-OM-2, 2/11 Echo:EF=50-55% Cardiac Risk Factors: Hypertension, IDDM Type 2, Lipids, PVD, Smoker and TIA  Symptoms:  Chest Pressure.  (last date of chest discomfort was about two weeks ago), Diaphoresis, DOE and Fatigue   Nuclear Pre-Procedure Caffeine/Decaff Intake:  None NPO After: 12:00am   Lungs:  Clear.  O2 Sat 95% on RA. IV 0.9% NS with Angio Cath:  20g  IV Site: R Antecubital  IV Started by:  Irean Hong, RN  Chest Size (in):  44 Cup Size: n/a  Height: 6\' 1"  (1.854 m)  Weight:  233 lb (105.688 kg)  BMI:  Body mass index is 30.74 kg/(m^2). Tech Comments:  n/a    Nuclear Med Study 1 or 2 day study: 1 day  Stress Test Type:  Lexiscan  Reading MD: Arvilla Meres, MD  Order Authorizing Provider:  Tonny Bollman, MD  Resting Radionuclide: Technetium 61m Tetrofosmin  Resting Radionuclide Dose: 11 mCi   Stress Radionuclide:  Technetium 61m Tetrofosmin  Stress Radionuclide Dose: 33 mCi           Stress Protocol Rest HR: 66 Stress HR: 83  Rest BP: 133/74 Stress BP: 117/68  Exercise Time (min): n/a METS: n/a   Predicted Max HR: 148 bpm % Max HR: 56.08 bpm Rate Pressure Product: 9711   Dose of Adenosine (mg):  n/a Dose of Lexiscan: 0.4 mg  Dose of Atropine (mg): n/a Dose of Dobutamine: n/a mcg/kg/min (at max HR)  Stress Test Technologist: Duke Salvia, CMA-N  Nuclear Technologist:  Domenic Polite, CNMT     Rest Procedure:  Myocardial perfusion imaging was performed at rest 45 minutes following the intravenous administration of Technetium 59m Tetrofosmin. Rest ECG: RBBB, nonspecific T-wave changes, rare PAC.  Stress Procedure:  The patient  received IV Lexiscan 0.4 mg over 15-seconds.  Technetium 96m Tetrofosmin injected at 30-seconds.  There were no significant changes with Lexiscan, only occasional PAC's and PVC's.  Quantitative spect images were obtained after a 45 minute delay. Stress ECG: No significant ST segment change suggestive of ischemia.  QPS Raw Data Images:  No motion. LV is dilated. Stress Images:  Severely decreased uptake throughout entire inferolateral wall. Mildly decreased uptake in distal anterior wall.  Rest Images:  Severely decreased uptake throughout entire inferolateral wall.  Subtraction (SDS):  Dense inferolateral infarct with trivial peri-infarct ischemia. ? Mild ischemia in distal anterior wall. Transient Ischemic Dilatation (Normal <1.22): 1.0 Lung/Heart Ratio (Normal <0.45):  .30  Quantitative Gated Spect Images QGS EDV:  179 ml QGS ESV:  117 ml QGS cine images:  Dilated LV with inferolateral severe HK/AK QGS EF: 35%  Impression Exercise Capacity:  Lexiscan with no exercise. BP Response:  n/a  Clinical Symptoms:  n/a ECG Impression:  No significant ECG changes with Lexiscan. Comparison with Prior Nuclear Study: No previous nuclear study performed  Overall Impression:  Abnormal Lexiscan Myoview. Dense inferolateral infarct with trivial peri-infarct ischemia. ? Mild ischemia in distal anterior wall.    Signed by Duke Salvia on 04/28/2011 at 12:07 PM.   Arvilla Meres

## 2011-04-29 NOTE — Progress Notes (Signed)
Myoview result was reviewed. The patient has a dense inferior wall infarction with trivial peri-infarct ischemia. 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 He underwent stenting of the left circumflex back at that time. His gated left ventricular ejection fraction is 35% but by echo last year the EF was 50-55%. I would recommend repeating the echo to make sure there has not been a significant decline in his left ventricular systolic function.

## 2011-04-29 NOTE — Progress Notes (Signed)
COPY ROUTED TO DR. COOPER.[name] ° ° °

## 2011-05-01 ENCOUNTER — Telehealth: Payer: Self-pay | Admitting: Physician Assistant

## 2011-05-01 NOTE — Telephone Encounter (Signed)
Mr. Voland needs his LOV, EKG & Stress, I printed out a copy of each and put it in Kim's file with a ROI attached. He will sign the ROI and pick up the records on Monday (05/04/11) morning.

## 2011-05-04 ENCOUNTER — Telehealth: Payer: Self-pay | Admitting: Cardiovascular Disease

## 2011-05-04 NOTE — Telephone Encounter (Signed)
Pt came in and signed 2 ROI forms. Records were faxed to [facility] and to [facility]. 05/04/11 BAB  [facility] : Phone: [phone] Fax: [phone]  [facility]: Phone: [phone] Fax: [phone]

## 2011-05-06 ENCOUNTER — Telehealth: Payer: Self-pay

## 2011-05-06 DIAGNOSIS — I2510 Atherosclerotic heart disease of native coronary artery without angina pectoris: Secondary | ICD-10-CM

## 2011-05-06 DIAGNOSIS — R079 Chest pain, unspecified: Secondary | ICD-10-CM

## 2011-05-06 NOTE — Progress Notes (Signed)
Pt aware of results by phone. The pt will have Echo checked on 05/14/11 at 4:00.

## 2011-05-06 NOTE — Telephone Encounter (Signed)
I spoke with the pt about his myoview results. Per Dr Excell Seltzer he would like the pt to have an Echo performed to further assess the pt's EF.  This has been scheduled on 05/14/11 at 4:00.  The pt would like a copy of his myoview faxed to Dr Nada Boozer at [facility] (phone #[phone] ext [phone]) and [facility] (phone [phone]).  I will call and obtain fax numbers.

## 2011-05-07 NOTE — Telephone Encounter (Signed)
Fax numbers obtained and information sent per pt's request.  [facility] fax #[phone], [facility] fax [phone]

## 2011-05-14 ENCOUNTER — Ambulatory Visit (HOSPITAL_COMMUNITY): Payer: Medicare Other | Attending: Cardiovascular Disease | Admitting: Radiology

## 2011-05-14 DIAGNOSIS — I059 Rheumatic mitral valve disease, unspecified: Secondary | ICD-10-CM | POA: Insufficient documentation

## 2011-05-14 DIAGNOSIS — E119 Type 2 diabetes mellitus without complications: Secondary | ICD-10-CM | POA: Insufficient documentation

## 2011-05-14 DIAGNOSIS — I517 Cardiomegaly: Secondary | ICD-10-CM | POA: Insufficient documentation

## 2011-05-14 DIAGNOSIS — I2510 Atherosclerotic heart disease of native coronary artery without angina pectoris: Secondary | ICD-10-CM | POA: Insufficient documentation

## 2011-05-14 DIAGNOSIS — I10 Essential (primary) hypertension: Secondary | ICD-10-CM | POA: Insufficient documentation

## 2011-05-14 DIAGNOSIS — E785 Hyperlipidemia, unspecified: Secondary | ICD-10-CM | POA: Insufficient documentation

## 2011-05-14 DIAGNOSIS — R079 Chest pain, unspecified: Secondary | ICD-10-CM

## 2011-05-15 NOTE — Discharge Summary (Signed)
[name], [name]                         ACCOUNT NO.:  [id number]   MEDICAL RECORD NO.:  [id number]                   PATIENT TYPE:  INP   LOCATION:  6523                                 FACILITY:  [facility]   PHYSICIAN:  Blanch Media, M.D.             DATE OF BIRTH:  Mar 14, 1936   DATE OF ADMISSION:  12/07/2003  DATE OF DISCHARGE:  12/12/2003                                 DISCHARGE SUMMARY   PRIMARY CARE PHYSICIAN:  Dr. Loree [name] in [place].   CONSULTATIONS:  Mohan N. Sharyn [name], M.D.   DISCHARGE DIAGNOSES:  1. Coronary artery disease.  2. Diabetes.  3. Hypertension.  4. Bipolar disorder.   DISCHARGE MEDICATIONS:  1. Hydrochlorothiazide 25 mg p.o. daily.  2. Aspirin 325 mg p.o. daily.  3. Lopressor 25 mg p.o. b.i.d.  4. Lisinopril 20 mg p.o. b.i.d.  5. Lithium 300 mg b.i.d.  6. NPH Insulin 30 units subcutaneously b.i.d.  7. Norvasc 5 mg daily.  8. Imdur 60 mg daily.  9. Nitrostat.   CONDITION ON DISCHARGE:  Stable.   FOLLOW UP:  Patient has follow up appointment on December 27, 2003 at 2:00  P.M. in the [facility] and patient will call  [facility] to make a follow up appointment with Dr. Sharyn [name].   PROCEDURE:  Cardiac catheterization done on December 11, 2003.  Findings  showed left ventricular inferior wall hypokinesia, good left ventricular  systolic function, ejection fraction 50%, left anterior descending 45 to 50%  sequential stenosis, left circumflex was patent proximally but 100% occluded  distally.  The right coronary artery is 100% occluded beyond its mid  portion.  Stent was attempted but unsuccessful as the lesion was felt to be  chronic and calcified.  The patient received heparin and Integrilin during  the procedure and tolerated it well.  There were no complications.   CONSULTATIONS:  Cardiology, Dr. Eduardo Osier. [name].   HISTORY OF PRESENT ILLNESS:  Briefly, this is a 75 year old African-American  male  with past medical history significant for diabetes, hypertension,  bipolar disorder and prostate cancer who presented to the emergency  department with chest pain for one day.  He reported the chest pain as  coming on intermittently lasting approximately 30 minutes each time and  radiating down his left arm.  Patient denies shortness of breath or  diaphoresis.  He denies nausea or vomiting.  Initially the pain woke him up  from sleep.  He claimed it was indigestion.   ALLERGIES:  PENICILLIN.   PAST MEDICAL HISTORY:  1. Hypertension.  2. Diabetes.  3. History of prostate cancer, status post x-ray therapy in 2003.  4. 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  5. Bipolar disorder.   MEDICATIONS ON ADMISSION:  1. Aspirin 325 mg p.o. daily.  2. Lithium 300 mg b.i.d.  3. Metoprolol 25 mg 1.5 daily.  4. Nitroglycerin PRN.  5. Hydrochlorothiazide 25 mg  one-half tablet q.a.m.  6. Lisinopril 20 mg b.i.d.  7. Insulin NPH 40 units b.i.d.  8. Pentoxifylline 400 mg t.i.d.   SOCIAL HISTORY:  The patient is a current smoker, one to two packs per day X  43 years.  Patient reports drinking one pint of alcohol daily.  Denies drug  use.  Patient is currently divorced, is a retired Engineer, manufacturing systems.  Patient  has Medicaid and social security, is on disability for chronic back pain.  Lives in [place] over the last month after moving from [place] once  he retired.  Mother and father died of unknown causes.  He has four healthy  brothers and sisters.  He has four children, one with lupus and  hypertension.   REVIEW OF SYMPTOMS:  As per history of present illness.   PHYSICAL EXAMINATION:  VITAL SIGNS:  On admission the pulse was 60, blood  pressure 161/83, temperature 98.3, respirations 24, oxygen saturation 99% on  2 liters, 97% on room air.  GENERAL:  The patient is sitting up in bed in no acute distress.  HEENT:  Pupils equal, round, reactive to light and accommodation.   Throat is  clear without erythema.  NECK:  Supple.  LUNGS:  Clear to auscultation bilaterally.  No rales, rhonchi or wheezes.  CARDIOVASCULAR:  Regular rate and rhythm with no murmurs, rubs or gallops.  GASTROINTESTINAL:  Soft, obese, positive bowel sounds.  No organomegaly.  EXTREMITIES:  Patient has trace edema bilateral lower extremities.  PSYCHIATRIC:  Appropriate affect.  Pleasant.   LABORATORY DATA:  On admission sodium is 140, potassium 3.7, chloride 103,  cO2 32, BUN 8, creatinine 1.1, glucose 107.  White blood cell count 9.0,  hemoglobin 13.7, platelet count 327,000, ANC 6.0, MCV 86.  Pro Time 12.9,  INR 1.0.  Cardiac enzymes in the emergency department myoglobin 87, CK-MB  1.2, troponin-I less than 0.5.  Chest x-ray showed questionable early  congestive heart failure.  Electrocardiogram showed right bundle branch  block, some T wave inversions in V4, V5, V6 and left bundle branch block.   HOSPITAL COURSE:  The patient was admitted to a telemetry bed, was kept on  oxygen, kept on aspirin, Metoprolol twice daily, lithium, his ACE inhibitor,  insulin at 20 units q.h.s., Lantus in sliding scale dosing, his diuretic,  hydrochlorothiazide, and we obtained a 2-dimensional echocardiogram and  cardiac enzymes X3, TSH, urine drug screen and lithium level.   PROBLEM #1:  CORONARY ARTERY DISEASE:  The echocardiogram showed normal left ventricular systolic function with  severe hypokinesis to akinesis in the inferior posterior wall, an ejection  fraction was 55% to 65%.  Mild to moderate dilation of left atrium, mild  right ventricular hypertrophy and mild dilatation of the right atrium.  Cardiology was consulted and Dr. Sharyn [name] came to see the patient.  They  discussed his options.  Initially the patient did not want to go to  catheterization despite Dr. Annitta [name] recommendation that this be done and the patient opted for the Cardiolite study instead.  However, when the  patient was  picked up to do his Cardiolite study on day #2 of hospital  admission he had not been made NPO and was therefore unable to have the  study done at which time the patient decided that he would go ahead with the  cardiac catheterization.  The patient remained in the hospital until Dr.  Sharyn [name] had a place on his schedule to perform catheterization.  The results  of the  catheterization were as dictated above.  A stent was unable to be  placed and Dr. Sharyn [name] suggested that the patient speak with CVTS for  possible coronary artery bypass grafting.  The patient was unwilling to  remain in the hospital long enough to have this discussion with  cardiovascular/thoracic surgery and decided he would rather be discharged  and be seen as an outpatient to discuss his options.  He remained chest-pain-  free throughout his hospitalization.  His cardiac enzymes were CK-MB 3.5,  then 2.2, then 1.4 and troponin was 0.23, 0.22 and 0.19.  The patient was  counseled on all his options and explained the risks involved and determined  that he would rather leave and discuss his options as an outpatient.  Therefore, the patient will make a follow up appointment with Dr. Sharyn [name]  when he is ready and we will see him as an outpatient in the [facility] for follow up.   PROBLEM #2:  DIABETES:  The patient's blood sugars were fairly well  controlled in the hospital.  We increased his Lantus from 20 to 22 units  q.h.s. for discharge for slightly better control.  However, the patient  takes NPH Insulin at home and was unwilling to change his regimen so he was  discharged with only NPH Insulin 30 units injected twice daily.  The patient  has a sliding scale that he uses at home which he will continue.  His  diabetes care manager spoke with the patient about his current regimen and  offered him options and suggestions.  He will have this followed as an  outpatient.   PROBLEM #3:  HYPERTENSION:   The patient's blood pressure was well controlled  throughout his hospitalization, 120's/60's to 70;'s.  He is discharged on  his home regimen of antihypertensives and included Norvasc.   PROBLEM #4:  BIPOLAR DISORDER:  The patient was on lithium throughout his  hospitalization and did not experience any problems with this, although his  lithium levels were slightly lower than normal.  We suggested the patient  may want to follow up with an outpatient psychiatrist since this has not  been done in a very long time.   PROBLEM #5:  TOBACCO ABUSE:  Counseled the patient on the need for smoking  cessation, however, he refused to change his habits at this time.  We will  continue to talk with him about this as an outpatient.   PROBLEM #6:  ALCOHOL ABUSE:  The patient reports heavy drinking for the last  15 years.  He is now retired and living in [place] with a girlfriend.  We talked about the benefits of cutting back on alcohol and the patient  acknowledges understanding of this.  We offered counseling therapy if the  patient would like it.  He did not want to use that at this time.   DISCHARGE LABORATORY DATA:  Sodium 133, potassium 3.5, chloride 103, cO2 27,  glucose 247, BUN 10, creatinine 1.3.  CBC with white blood cell count 8.2,  hemoglobin 12.4, hematocrit 38.1, platelet count 285,000.      Lonia Blood, M.D.                         Blanch Media, M.D.    LG/MEDQ  D:  12/13/2003  T:  12/14/2003  Job:  [id number]

## 2011-05-15 NOTE — Discharge Summary (Signed)
[name], [name]                         ACCOUNT NO.:  [id number]   MEDICAL RECORD NO.:  [id number]                   PATIENT TYPE:  INP   LOCATION:  2308                                 FACILITY:  [facility]   PHYSICIAN:  Ileana Roup, M.D.               DATE OF BIRTH:  08-Jan-1936   DATE OF ADMISSION:  12/20/2003  DATE OF DISCHARGE:  12/24/2003                                 DISCHARGE SUMMARY   DISCHARGE DIAGNOSES:  1. Known Q-wave myocardial infarction (with history of coronary artery     disease).  2. Hypertension.  3. Diabetes mellitus.  4. Bipolar disorder.   DISCHARGE MEDICATIONS:  1. Lisinopril 20 mg one p.o. q.12h.  2. Imdur 60 mg one p.o. q.a.m.  3. Toprol XL 50 mg one p.o. daily.  4. Lipitor 20 mg one p.o. daily.  5. Plavix 75 mg one p.o. daily.  6. Aspirin 81 mg p.o. daily.  7. Lithium carbonate 300 mg one p.o. q.12h. (with food).  8. NPH insulin 30 units subcutaneously q.a.m. and 30 units subcutaneously     q.a.m.  9. Nitroglycerin 0.4 mg q.5 minutes (maximum of three tablets) p.r.n., chest     pain.   DISPOSITION/FOLLOWUP:  The patient was discharged to his own home, and he is  to call Dr. Annitta Jersey office to arrange for followup with him in two weeks  from discharge.  The phone numbers for his office have been furnished to the  patient by Dr. Sharyn Lull himself.  The patient [facility].  The pertinent things to be addressed  during this followup visit would be to ensure his lithium levels, good  compliance with his diabetic medication and to ensure that his chest pain is  fully resolved.   CONSULTATIONS:  1. The first major consultation that was sought was with Cardiology, Dr.     Rinaldo Cloud.  2. The second major consultation was with Cardiovascular and Thoracic     Surgery, Dr. Evelene Croon.   PROCEDURES:  The major procedures performed were as follows:  1. On December 22, 2003, arterial  vascular evaluation was done by ABI     studies.  This showed the right side being severely reduced in its flow,     and the left ABI showed a normal exam.  However, wave forms were     narrowed, possibly because of the calcification of the vessels.  2. Chest x-ray done on December 20, 2003, that showed mild cardiomegaly.   HISTORY OF PRESENT ILLNESS:  The patient is a 75 year old, African-American  male with a past medical history significant for diabetes mellitus,  hypertension and ongoing tobacco abuse.  He was in the hospital on the dates  of December 07, 2003, to December 10, 2003, when he had a cardiac  catheterization that showed two-vessel, severe disease.  It was decided at  that time that this patient would benefit primarily from medical management  and, should chest pain redevelop, then CVTS recommendation would be made.  The patient notes, on the day of presentation, he was walking at a local  shopping center when he developed chest pain that was unremitting on  sublingual nitroglycerin, as well as Tums/Rolaids.  He denied any associated  shortness of breath or diaphoresis.  He noted that the chest pain was  radiating down his left arm.  His pain improved slightly with deep  breathing.   ALLERGIES:  THE PATIENT IS ALLERGIC TO PENICILLIN.   PAST MEDICAL HISTORY:  1. Catheterization done in December 2004 showing left ventricular wall     inferior hypokinesis with a normal left ventricular ejection fraction of     50%.  On the left anterior descending artery, 45%-50% stenosis was noted,     while the left circumflex artery had 100% obstruction noted distally.  On     the right coronary artery, 100% obstruction was also noted.  Unsuccessful     stent placement was noted during this cardiac catheterization procedure.  2. History of prostate cancer, status post radiation therapy done in 2003.  3. Status post femoral-popliteal bypass done in 2003.   SOCIAL HISTORY:  The patient  is a current smoker, smoking one to two packs a  day of cigarettes for the past 45 years or so.  The patient is also a  current alcohol user, using 1 pint of beer per day.  He denies any other  drug use.  He is educated through the 12th grade, and he is a retired Careers adviser.  He has Medicaid covering his healthcare and prescription financing.  He also claims VA benefits.  The patient desires a Radio producer,  secondary to disability from chronic lower back pain.  He has recently  immigrated from [place], where he lived before and has a primary care  physician.   REVIEW OF SYSTEMS:  Significant for fatigue, chest pain, paroxysmal  nocturnal dyspnea, edema, diarrhea, dysphagia, joint pain, weakness and  depression.   PHYSICAL EXAMINATION:  VITAL SIGNS:  Pulse 88.  Blood pressure 152/82.  Temperature 98.8.  Respirations 20.  O2 saturation 98% on 2 L via nasal  cannula.  GENERAL:  The patient is lying in bed, in no acute distress.  EYES:  Both pupils are equally reactive to light and accommodation.  ENT:  His throat is not hyperemic.  NECK:  Supple.  No goiter palpable.  RESPIRATORY:  Both lung fields are clear to auscultation.  CARDIOVASCULAR:  Pulse is regular in rate and rhythm.  Heart sounds S1 and  S2 are accompanied by an S3 sound.  GI:  Abdomen is soft, obese, nontender and nondistended, and bowel sounds  are normal.  EXTREMITIES:  He has trace edema palpable bilaterally.   LABORATORY DATA:  Admission labs are as follows:  Sodium 142, potassium 3.7,  chloride 102, bicarbonate 86, BUN 12, creatinine 1.5, glucose 86, hemoglobin  13.3, white cell count 8.6, absolute neutrophils 6.1, platelets 366,  prothrombin time 13 seconds with an INR of 1.0, PTT 32 and magnesium level  1.7.  Point of care cardiac markers that were done showed an elevated  troponin-I at a level of 0.10 noted on one of the three sets; otherwise, myoglobulin and CK-MB fractions were both within normal  limits.  EKG done on  admission showed a right bundle branch block, normal sinus rhythm at a rate  of 63 and ST depression changes noted on leads 2 and aVF.  Additional sets  of cardiac enzymes done when the patient was on the floor showed the first  set with a CK of 224 and CK-MB 19.5 with troponin-I 4.85, the second set  showed a CK of 159, CK-MB of 10.3 and a troponin of 2.82, the third set done  showed a CK of 84, CK-MB 2.0 and troponin-I 1.56.  Hemoglobin A1C showed a  level of 7%.   HOSPITAL COURSE:  1. Severe chest pain; this was most likely a myocardial infarction, based on     the patient's history of coronary artery disease.  With elevated cardiac     enzymes and no profound EKG changes, a diagnosis of non-Q-wave myocardial     infarction was entertained.  A prompt Cardiology consultation was sought,     and Dr. Sharyn Lull was prompt to respond.  He started this patient on a     heparin drip, as well as Integrilin and nitroglycerin.  Along with this,     he also had adjunctive treatment with beta-blockers, aspirin and an ACE     inhibitor.  He further advised [place] that this patient had had an earlier     catheterization, in the month of presentation, and it was not amenable to     percutaneous intervention.  However, he did suggest that the distal     aspect of the circumflex artery could be salvageable via percutaneous     intervention, but he wanted Cardiovascular and Thoracic Surgery to     evaluate this patient for possible coronary artery bypass grafting.     Cardiovascular and Thoracic Surgery consultation was sought, and Dr.     Laneta Simmers responded.  Dr. Laneta Simmers advised that this patient was not a very     good surgical candidate, given that he had significant comorbidity     factors and surgery, itself, would not be able to increase the long-term     mortality and morbidity benefits for this patient.  The patient, himself,     was also reluctant for further percutaneous  intervention and was very     skeptical of having coronary artery bypass grafting done on him.  The     patient was explained the risk/benefit analysis, and he opted not to have     any intervention done at this time.  He was, subsequently, discharged     home on the medications as listed, and he was advised to follow up with     Dr. Rinaldo Cloud as an outpatient for possible reevaluation, should he     reconsider having a percutaneous intervention and probably coronary     artery stenting done on his distal circumflex.  2. Hypertension.  The patient was maintained on the medications that he was     on prior to presentation.  Hypertension treatment was further augmented     by the fact that the patient was put on a nitroglycerin drip that was     later switched over to p.o. nitroglycerin.  The patient's blood pressure     remained in good control throughout his hospitalization stay and he was,    consequently, discharged on the same medications as listed above.  3. Diabetes mellitus.  From his hemoglobin A1C, it was apparent that this     patient had had good control of his blood sugars prior to presentation.     The patient was maintained  on the same insulin doses that he was on as an     outpatient, and treatment was further augmented by initiation of a     sliding scale.  The patient was discharged on the same NPH insulin dose     that he was on prior to presentation.  4. Bipolar disorder.  Throughout his hospitalization stay, the patient did     not display any episodes of acute mania or depressive effect, and he will     be continued on the lithium carbonate that he had been taking as an     outpatient.  Serum lithium levels were drawn several times, due to     impending renal insufficiency that this patient also has, likely as a     result of his atherosclerotic disease, and the dosage titration was done     in accordance, to ensure no toxicity occurred in him.      Zetta Bills,  MD                             Ileana Roup, M.D.    JP/MEDQ  D:  04/03/2004  T:  04/04/2004  Job:  [id number]

## 2011-05-15 NOTE — Cardiovascular Report (Signed)
NAMEHINTON, LUELLEN                         ACCOUNT NO.:  [id number]   MEDICAL RECORD NO.:  [id number]                   PATIENT TYPE:  INP   LOCATION:  4705                                 FACILITY:  [facility]   PHYSICIAN:  Mohan N. Sharyn Lull, M.D.              DATE OF BIRTH:  12/29/1935   DATE OF PROCEDURE:  12/11/2003  DATE OF DISCHARGE:                              CARDIAC CATHETERIZATION   PROCEDURE:  1. Left cardiac catheterization.  2. Selective left and right coronary angiography.  3. Left ventricular angiography via right groin using Judkins technique.  4. Attempted PTCA to 100% occluded right coronary artery without success.   INDICATIONS FOR PROCEDURE:  Mr. Moll is a 75 year old black male with  past medical history significant for coronary artery disease, history of  questionable silent MI 20 years ago, hypertension, insulin-requiring  diabetes mellitus, peripheral vascular disease, tobacco abuse, alcohol  abuse, history of bipolar disorder, history of CA of prostate.  He came to  the ER complaining of retrosternal chest pain off and on since yesterday  noon, grade 10/10, radiating to the left arm.  Took Tums, Rolaids, and  baking soda without relief so decided to come to ER.  The patient denies any  nausea, vomiting, or diaphoresis.  Denies palpitations, lightheadedness, or  syncope.  Denies relation of chest pain to food or breathing.   PAST MEDICAL HISTORY:  As above.   PAST SURGICAL HISTORY:  He had left fem-pop bypass in the past.   ALLERGIES:  PENICILLIN.   MEDICATIONS:  1. Aspirin.  2. Lithium.  3. Metoprolol.  4. Hydrochlorothiazide.  5. Fosinopril.  6. Trental.   SOCIAL HISTORY:  He is divorced, has four children.  Smoked eight cigars per  day for 40+ years.  Drinks one pint of hard liquor daily for 30+ years.  Retired Scientist, physiological.  Born in [place].  Moved here from [place].   FAMILY HISTORY:  Negative for coronary artery disease.   PHYSICAL  EXAMINATION:  GENERAL:  He was alert, awake, oriented x3.  No acute  distress.  Hemodynamically stable.  Sinus brady on the monitor.  HEENT:  Conjunctiva was pink.  NECK:  Supple.  No JVD.  No bruit.  LUNGS:  Clear to auscultation without rhonchi or rales.  CARDIOVASCULAR:  S1, S2 was normal.  There was no S3 gallop or murmur.  ABDOMEN:  Soft.  Bowel sounds were present.  Nontender.  EXTREMITIES:  There is no clubbing, cyanosis.  There was 1+ pedal edema.   LABORATORIES:  EKG showed normal sinus rhythm with right bundle branch block  with secondary ST-T wave changes.  Three sets of cardiac enzymes in the ER  were negative.  Subsequent CPKs were also negative and troponin I was  slightly elevated.  They were 0.23, 0.22, and 0.19.  Hist first CK 92, MB  3.5.  Second set CK 80, MB 2.2.  Third set CK 96,  MB 1.4.   Due to atypical anginal and multiple risk factors and mildly elevated  troponin I, discussed with patient at length regarding left catheterization,  possible PTCA and stenting, its risks, i.e., death, MI, stroke, need for  emergency CABG, local vascular complications, etc. and consented for PCI.   PROCEDURE:  After obtaining the informed consent patient was brought to the  catheterization laboratory and was placed on fluoroscopy table.  Right groin  was prepped and draped in usual fashion.  2% Xylocaine was used for local  anesthesia in right groin.  With the help of thin wall needle a 6-French  arterial sheath was placed.  The sheath was aspirated and flushed.  Next, 6-  French left Judkins catheter was advanced over the wire under fluoroscopic  guidance up to the ascending aorta where it was pulled out.  The catheter  was aspirated and connected to the manifold.  Catheter was further advanced  and engaged into left coronary ostium.  Multiple views of the left system  were taken.  Next, the catheter was disengaged and was pulled out over the  wire and was replaced with 6-French  right Judkins catheter which was  advanced over the wire under fluoroscopic guidance up to the ascending aorta  where it was pulled out.  The catheter was aspirated and connected to the  manifold.  Catheter was further advanced and engaged into right coronary  ostium.  Multiple views of the right system were taken.  Next, the catheter  was disengaged and was pulled out over the wire and was replaced with 6-  French pigtail catheter which was advanced over the wire under fluoroscopic  guidance up to the ascending aorta where it was pulled out.  The catheter  was aspirated and connected to the manifold.  Catheter was further advanced  across the aortic valve into the LV.  LV pressures were recorded.  Next, LV  graphy was done in 30 degree RAO position.  Post angiographic pressures were  recorded from LV and then pullback pressures were recorded from the aorta.  There was no gradient across the aortic valve.  Next, the pigtail catheter  was pulled out over the wire.  Sheaths were aspirated and flushed.   FINDINGS:  LV showed inferior wall hypokinesia, good LV systolic function,  EF of approximately 50%.  Left main was short which was patent.  LAD was  calcified proximally and has 40-50% sequential stenosis.  Diagonal 1 has 60-  70% proximal and mid sequential stenosis.  Vessel is small.  Also, the  lesion appears to be calcified.  Distally, this vessel is very small and  diffusely diseased.  Left circumflex is patent proximally and then it is  100% occluded after giving off large OM1 which was filling by collaterals  from the left system.  The vessel in AV groove is very small and diffusely  diseased.  Distally the vessel is filling by collaterals from the left  system as above.  OM1 is large which has 50-60% mid stenosis beyond the  bifurcation.  The superior branch is smaller as compared to inferior branch. RCA is 100% occluded beyond mid portion which has faint collaterals coming  from  right system.   INTERVENTIONAL PROCEDURE:  Attempted to cross 100% occluded RCA using high-  torque floppy and then __________ and then Cross-It wire with support with  1.5 x 15 mm long Maverick balloon without success.  Lesion was felt to be  chronic and calcified.  The patient did receive weight based heparin and  Integrilin during the procedure.  The patient tolerated procedure well.  There were no complications.  Plan is to maximize the medical management.  The patient does have two vessel coronary artery disease.  His LAD has 40-  50% proximal sequential lesions which does not appear to be critical.  OM1  is large also which has 50-60% lesions which also does not appear to be  critical.  Left circumflex is 100% occluded beyond OM1 which is filling by  the collaterals.  This vessel is diffusely diseased and very small.  RCA  also appears to be chronically occluded and is small vessel.  The patient  has codominant coronary system.  The patient tolerated procedure well.  Plan  is to maximize medical management.  If he continues to have chest pains,  then we will consider getting CVTS consult for possible CABG.  We will  discuss the findings with the patient and the family.                                               Eduardo Osier. Sharyn Lull, M.D.    MNH/MEDQ  D:  12/11/2003  T:  12/11/2003  Job:  [id number]   cc:   Cath Lab

## 2011-07-09 ENCOUNTER — Ambulatory Visit (INDEPENDENT_AMBULATORY_CARE_PROVIDER_SITE_OTHER): Payer: Medicare Other

## 2011-07-09 ENCOUNTER — Inpatient Hospital Stay (INDEPENDENT_AMBULATORY_CARE_PROVIDER_SITE_OTHER)
Admission: RE | Admit: 2011-07-09 | Discharge: 2011-07-09 | Disposition: A | Discharge status: Home / Self Care | Payer: PRIVATE HEALTH INSURANCE | Source: Ambulatory Visit | Attending: Family Medicine | Admitting: Family Medicine

## 2011-07-09 DIAGNOSIS — R609 Edema, unspecified: Secondary | ICD-10-CM

## 2011-07-09 DIAGNOSIS — N19 Unspecified kidney failure: Secondary | ICD-10-CM

## 2011-07-09 LAB — POCT I-STAT, CHEM 8
BUN: 17 mg/dL (ref 6–23)
Calcium, Ion: 1.18 mmol/L (ref 1.12–1.32)
Creatinine, Ser: 1.5 mg/dL — ABNORMAL HIGH (ref 0.50–1.35)
Hemoglobin: 15.3 g/dL (ref 13.0–17.0)
Sodium: 143 mEq/L (ref 135–145)
TCO2: 30 mmol/L (ref 0–100)

## 2011-07-09 LAB — POCT URINALYSIS DIP (DEVICE)
Bilirubin Urine: NEGATIVE
Glucose, UA: 500 mg/dL — AB
Hgb urine dipstick: NEGATIVE
Leukocytes, UA: NEGATIVE
Nitrite: NEGATIVE
Urobilinogen, UA: 4 mg/dL — ABNORMAL HIGH (ref 0.0–1.0)
pH: 6 (ref 5.0–8.0)

## 2011-07-21 ENCOUNTER — Ambulatory Visit: Payer: Medicare Other | Admitting: Cardiovascular Disease

## 2011-07-28 ENCOUNTER — Encounter: Payer: Self-pay | Admitting: Cardiovascular Disease

## 2011-08-04 ENCOUNTER — Telehealth: Payer: Self-pay | Admitting: Cardiovascular Disease

## 2011-08-04 ENCOUNTER — Encounter: Payer: Self-pay | Admitting: Cardiovascular Disease

## 2011-08-04 ENCOUNTER — Ambulatory Visit (INDEPENDENT_AMBULATORY_CARE_PROVIDER_SITE_OTHER): Payer: PRIVATE HEALTH INSURANCE | Admitting: Cardiovascular Disease

## 2011-08-04 VITALS — BP 133/64 | HR 85 | Resp 14 | Ht 72.0 in | Wt 241.0 lb

## 2011-08-04 DIAGNOSIS — I2510 Atherosclerotic heart disease of native coronary artery without angina pectoris: Secondary | ICD-10-CM

## 2011-08-04 DIAGNOSIS — R609 Edema, unspecified: Secondary | ICD-10-CM

## 2011-08-04 DIAGNOSIS — I10 Essential (primary) hypertension: Secondary | ICD-10-CM

## 2011-08-04 DIAGNOSIS — E785 Hyperlipidemia, unspecified: Secondary | ICD-10-CM

## 2011-08-04 NOTE — Telephone Encounter (Signed)
Spoke with pt who gave list of meds and following lab work:  Liver--AST-13 Potassium--3.9, BUN--19,Creat-1.6, Glucose-106 on July 21, 2011 Potassium--4.2,BUN--17, Creat-1.5, Glucose-161 on July 16, 2011  Med list updated

## 2011-08-04 NOTE — Patient Instructions (Signed)
Your physician wants you to follow-up in: 6 months. You will receive a reminder letter in the mail two months in advance. If you don't receive a letter, please call our office to schedule the follow-up appointment.  Please call us with your list of medicines and doses and results of lab work done recently.

## 2011-08-04 NOTE — Telephone Encounter (Signed)
Pt called and has list of medications.  Please call him for this information for Dr. Excell Seltzer.

## 2011-08-20 ENCOUNTER — Encounter: Payer: Self-pay | Admitting: Cardiovascular Disease

## 2011-08-20 DIAGNOSIS — R609 Edema, unspecified: Secondary | ICD-10-CM | POA: Insufficient documentation

## 2011-08-20 NOTE — Telephone Encounter (Signed)
Recommend increase furosemide to 40 mg daily.

## 2011-08-20 NOTE — Progress Notes (Signed)
HPI: 75 year-old male with coronary and peripheral arterial disease, presenting for f/u evaluation. He was hospitalized in February 2011 with a NSTEMI and underwent PCI of the distal left circumflex with an Endeavor DES. He receives most of his medical care through the [facility].  He complains of leg swelling bilaterally over the past 3 months. He notes amlodipine was discontinued one month ago and he was started on a diuretic. He reports improvement in leg swelling since that time. He denies chest pain or pressure, dyspnea, orthopnea, or PND. He had recent labwork done at the [facility] but he did not bring results. He also did not bring meds with him or a list, and he isn't sure of his diuretic dosage.   He had a Myoview stress test in May 2012 showing a gated LVEF of 35% with a dense inferolateral infarct and question of distal anterior ischemia. Follow-up echo showed an LVEF of 55% with basal inferior akinesis and severe concentric LVH.  Outpatient Encounter Prescriptions as of 08/04/2011  Medication Sig Dispense Refill  . ammonium lactate (AMLACTIN) 12 % cream as needed. Apply Once a Day      . aspirin 325 MG EC tablet Take 325 mg by mouth daily.        . cilostazol (PLETAL) 50 MG tablet Take 50 mg by mouth daily.       . Cyanocobalamin (VITAMIN B 12 PO) Take 1 tablet by mouth daily. 1000 MCG       . DAILY MULTIPLE VITAMINS PO Take 1 tablet by mouth once a day.       . divalproex (DEPAKOTE) 250 MG EC tablet 6 tablets everyday.       . finasteride (PROSCAR) 5 MG tablet Take 5 mg by mouth daily.        Marland Kitchen lisinopril (PRINIVIL,ZESTRIL) 40 MG tablet Take half a tablet daily.       . nitroGLYCERIN (NITRODUR - DOSED IN MG/24 HR) 0.4 mg/hr Apply 1 patch each morning and remove at bedtime.       . penbutolol (LEVATOL) 20 MG TABS Take 20 mg by mouth 2 (two) times daily.        . Tamsulosin HCl (FLOMAX) 0.4 MG CAPS Take 0.4 mg by mouth daily.        . travoprost, benzalkonium, (TRAVATAN) 0.004 %  ophthalmic solution Place 1 drop into both eyes at bedtime.        Marland Kitchen DISCONTD: amLODipine (NORVASC) 10 MG tablet Take 10 mg by mouth daily.        Marland Kitchen DISCONTD: AMLODIPINE BESYLATE PO Take one tablet by mouth daily      . DISCONTD: clopidogrel (PLAVIX) 75 MG tablet Take 75 mg by mouth daily.        Marland Kitchen DISCONTD: insulin NPH-insulin regular (NOVOLIN 70/30) (70-30) 100 UNIT/ML injection As Directed       . DISCONTD: pantoprazole (PROTONIX) 40 MG tablet Take 1 tablet (40 mg total) by mouth 2 (two) times daily.  60 tablet  11  . DISCONTD: simvastatin (ZOCOR) 20 MG tablet Take 20 mg by mouth at bedtime.          Allergies  Allergen Reactions  . Penicillins     REACTION: hands and feet edema    Past Medical History  Diagnosis Date  . Hyperlipidemia   . HTN (hypertension)     echo 2/11: EF 50-55%, diast dyfxn, severe LVH, inf HK, LAE  . CAD (coronary artery disease)  a.  NSTEMI treated with PCI Feb 2011 with a DES.(Endeavor);   b. cath 2/11: D1 stents x 2 ok, AV groove CFX occluded with dist AV CFX filled by L-L collats, RCA occluded, OM2 90-95% (treated with PCI)  . MI (myocardial infarction)   . PVD (peripheral vascular disease)   . DM (diabetes mellitus)   . GERD (gastroesophageal reflux disease)   . Bipolar disorder   . Prostate cancer     status post radiation therapy in 2003    ROS: Negative except as per HPI  BP 133/64  Pulse 85  Resp 14  Ht 6' (1.829 m)  Wt 241 lb (109.317 kg)  BMI 32.69 kg/m2  PHYSICAL EXAM: Pt is alert and oriented, NAD HEENT: normal Neck: JVP - normal, carotids 2+= without bruits Lungs: CTA bilaterally CV: RRR without murmur or gallop Abd: soft, NT, Positive BS, no hepatomegaly Ext: 3+ left leg edema, 2+ right leg edema. distal pulses intact and equal Skin: warm/dry no rash  EKG:  Sinus rhythm with RBBB, LAD, ST-T changes consider lateral ischemia.  ASSESSMENT AND PLAN:

## 2011-08-20 NOTE — Assessment & Plan Note (Signed)
The patient is stable without angina. His stress test shows a predominately fixed defect. He will continue with his current medical program.

## 2011-08-20 NOTE — Assessment & Plan Note (Signed)
BP well-controlled on current meds. Labs monitored through the [facility].

## 2011-08-20 NOTE — Assessment & Plan Note (Signed)
I have asked him to call with recent labwork and diuretic dosage so that adjustments can be made.

## 2011-08-20 NOTE — Assessment & Plan Note (Signed)
Pt is on a statin with simva 40 mg - lipids also monitored through the [facility].

## 2011-08-21 NOTE — Telephone Encounter (Signed)
I talked with pt. Pt states he does not have enough furosemide to increase to 40mg  daily. He is scheduled to go to the [facility] 08/28/11 and can get furosemide 40mg  at that time. I told pt that we would mail a prescription to him for furosemide 40mg  daily to take to the [place]. At pt's request  I have put contact information for Shelby Dubin [phone] ext [phone]) and pt's PCP at the [facility], Dr Nada Boozer [phone] ext [phone]) in the demographic section of EPIC.

## 2011-09-10 ENCOUNTER — Telehealth: Payer: Self-pay | Admitting: Cardiovascular Disease

## 2011-09-10 DIAGNOSIS — I10 Essential (primary) hypertension: Secondary | ICD-10-CM

## 2011-09-10 NOTE — Telephone Encounter (Signed)
Spoke with pt. He recently saw Shelby Dubin, PharmD at [facility] and pt states she suggested he have kidney function lab work done as Lasix was recently increased.  He will come in tomorrow or early next week for BMP.

## 2011-09-10 NOTE — Telephone Encounter (Signed)
Pt calling wanting to know if MD wants to get a liver panel done so pt liver functions can be check. Please return pt call to discuss further.

## 2011-09-11 ENCOUNTER — Other Ambulatory Visit (INDEPENDENT_AMBULATORY_CARE_PROVIDER_SITE_OTHER): Payer: PRIVATE HEALTH INSURANCE | Admitting: *Deleted

## 2011-09-11 DIAGNOSIS — I10 Essential (primary) hypertension: Secondary | ICD-10-CM

## 2011-09-14 LAB — BASIC METABOLIC PANEL
CO2: 29 mEq/L (ref 19–32)
Calcium: 9.2 mg/dL (ref 8.4–10.5)
GFR: 46.6 mL/min — ABNORMAL LOW (ref >60.00)
Sodium: 138 mEq/L (ref 135–145)

## 2011-09-18 ENCOUNTER — Telehealth: Payer: Self-pay | Admitting: Cardiovascular Disease

## 2011-09-18 DIAGNOSIS — I2510 Atherosclerotic heart disease of native coronary artery without angina pectoris: Secondary | ICD-10-CM

## 2011-09-18 NOTE — Telephone Encounter (Signed)
Pt had blood work and he wants results sent to [facility] fax# [phone] Let him know the results too

## 2011-09-18 NOTE — Telephone Encounter (Signed)
Pt aware of lab results by phone.  [facility].  The pt will come into the office on 12/14/11 for a follow-up BMP.

## 2011-12-14 ENCOUNTER — Other Ambulatory Visit (INDEPENDENT_AMBULATORY_CARE_PROVIDER_SITE_OTHER): Payer: PRIVATE HEALTH INSURANCE | Admitting: *Deleted

## 2011-12-14 DIAGNOSIS — I2510 Atherosclerotic heart disease of native coronary artery without angina pectoris: Secondary | ICD-10-CM

## 2011-12-14 LAB — BASIC METABOLIC PANEL
Chloride: 103 mEq/L (ref 96–112)
GFR: 53.56 mL/min — ABNORMAL LOW (ref >60.00)
Potassium: 4 mEq/L (ref 3.5–5.1)
Sodium: 144 mEq/L (ref 135–145)

## 2012-02-02 ENCOUNTER — Ambulatory Visit (INDEPENDENT_AMBULATORY_CARE_PROVIDER_SITE_OTHER): Payer: PRIVATE HEALTH INSURANCE | Admitting: Cardiovascular Disease

## 2012-02-02 ENCOUNTER — Encounter: Payer: Self-pay | Admitting: Cardiovascular Disease

## 2012-02-02 VITALS — BP 122/72 | HR 68 | Ht 74.0 in | Wt 243.0 lb

## 2012-02-02 DIAGNOSIS — I10 Essential (primary) hypertension: Secondary | ICD-10-CM

## 2012-02-02 DIAGNOSIS — E785 Hyperlipidemia, unspecified: Secondary | ICD-10-CM

## 2012-02-02 DIAGNOSIS — R609 Edema, unspecified: Secondary | ICD-10-CM

## 2012-02-02 DIAGNOSIS — I2510 Atherosclerotic heart disease of native coronary artery without angina pectoris: Secondary | ICD-10-CM

## 2012-02-02 NOTE — Assessment & Plan Note (Signed)
I suspect this is multifactorial from venous insufficiency, hypertensive heart disease with diastolic dysfunction, and immobility. I would continue the patient's current diuretics and I encourage salt restriction and leg elevation.

## 2012-02-02 NOTE — Patient Instructions (Signed)
Your physician wants you to follow-up in: 6 MONTHS.  You will receive a reminder letter in the mail two months in advance. If you don't receive a letter, please call our office to schedule the follow-up appointment.  Your physician recommends that you continue on your current medications as directed. Please refer to the Current Medication list given to you today.  

## 2012-02-02 NOTE — Assessment & Plan Note (Signed)
Blood pressure is well-controlled on multidrug therapy with diltiazem, furosemide, lisinopril, and Aldactone. The patient has moderate renal impairment which has been stable and recently checked in December 2012.

## 2012-02-02 NOTE — Progress Notes (Signed)
HPI:  The patient is now 76 years old and presents for followup evaluation. The patient is followed for coronary disease, hypertension, and peripheral arterial disease. He receives much of his medical care through the [facility] medical system. He underwent PCI 2 years ago after presenting with a non-ST elevation infarction. He had severe stenosis the left circumflex that was treated with an endeavor drug-eluting stent.  Her all the patient is doing well. He walks with a cane and tells that he can walk a little further than in the past. He complains of bilateral groin pain with walking but he is started on some medication for this and his ability to ambulate has improved. He denies chest pain or dyspnea. He continues to smoke one pack of cigarettes daily and is not interested in quitting. He does not restrict salt in his diet. He denies orthopnea, dyspnea, or palpitations. He does complain of leg swelling.  Outpatient Encounter Prescriptions as of 02/02/2012  Medication Sig Dispense Refill  . ammonium lactate (AMLACTIN) 12 % cream as needed. Apply Once a Day      . aspirin 325 MG EC tablet Take 325 mg by mouth daily.        . cilostazol (PLETAL) 50 MG tablet Take 50 mg by mouth daily.       . Cyanocobalamin (VITAMIN B 12 PO) Take 1 tablet by mouth daily. 1000 MCG       . DAILY MULTIPLE VITAMINS PO Take 1 tablet by mouth once a day.       . diltiazem (DILACOR XR) 120 MG 24 hr capsule Take 120 mg by mouth daily.        . divalproex (DEPAKOTE) 250 MG EC tablet 6 tablets everyday.       . finasteride (PROSCAR) 5 MG tablet Take 5 mg by mouth daily.        . furosemide (LASIX) 20 MG tablet Take 40 mg by mouth daily.       Marland Kitchen gabapentin (NEURONTIN) 300 MG capsule Take 300 mg by mouth 3 (three) times daily.      . insulin glargine (LANTUS) 100 UNIT/ML injection Inject 38 Units into the skin daily.        . insulin regular (HUMULIN R,NOVOLIN R) 100 units/mL injection Inject 8 Units into the skin daily before breakfast.         . latanoprost (XALATAN) 0.005 % ophthalmic solution Place 1 drop into both eyes at bedtime.        Marland Kitchen lisinopril (PRINIVIL,ZESTRIL) 40 MG tablet 40 mg daily.       . nitroGLYCERIN (NITRODUR - DOSED IN MG/24 HR) 0.4 mg/hr Apply 1 patch each morning and remove at bedtime.       . pantoprazole (PROTONIX) 40 MG tablet Take 40 mg by mouth daily.      . penbutolol (LEVATOL) 20 MG TABS Take 20 mg by mouth 2 (two) times daily.        . rosuvastatin (CRESTOR) 10 MG tablet Take 20 mg by mouth daily.       Marland Kitchen spironolactone (ALDACTONE) 25 MG tablet Take 25 mg by mouth daily.      . Tamsulosin HCl (FLOMAX) 0.4 MG CAPS Take 0.4 mg by mouth daily.        . travoprost, benzalkonium, (TRAVATAN) 0.004 % ophthalmic solution Place 1 drop into both eyes at bedtime.          Allergies  Allergen Reactions  . Penicillins     REACTION: hands  and feet edema    Past Medical History  Diagnosis Date  . Hyperlipidemia   . HTN (hypertension)     echo 2/11: EF 50-55%, diast dyfxn, severe LVH, inf HK, LAE  . CAD (coronary artery disease)     a.  NSTEMI treated with PCI Feb 2011 with a DES.([facility]deavor);   b. cath 2/11: D1 stents x 2 ok, AV groove CFX occluded with dist AV CFX filled by L-L collats, RCA occluded, OM2 90-95% (treated with PCI)  . MI (myocardial infarction)   . PVD (peripheral vascular disease)   . DM (diabetes mellitus)   . GERD (gastroesophageal reflux disease)   . Bipolar disorder   . Prostate cancer     status post radiation therapy in 2003    ROS: Negative except as per HPI  BP 122/72  Pulse 68  Ht 6\' 2"  (1.88 m)  Wt 110.224 kg (243 lb)  BMI 31.20 kg/m2  PHYSICAL EXAM: Pt is alert and oriented, NAD HEENT: normal Neck: JVP - normal, carotids 2+= without bruits Lungs: CTA bilaterally CV: RRR without murmur or gallop Abd: soft, NT, Positive BS, no hepatomegaly Ext: 2+ pretibial edema bilaterally Skin: warm/dry no rash  EKG:  Normal sinus rhythm with sinus arrhythmia, right  bundle branch block, T-wave abnormality consider lateral ischemia  ASSESSMENT AND PLAN:

## 2012-02-02 NOTE — Assessment & Plan Note (Signed)
The patient is stable without angina. He will continue on his current medical program. He is compliant with his medications and is not interested in lifestyle modification. I will see him back in 6 months for followup.

## 2012-02-02 NOTE — Assessment & Plan Note (Signed)
The patient is on a statin drug for treatment of his hyperlipidemia. His lipids are followed through the [facility] medical system.

## 2012-09-05 ENCOUNTER — Ambulatory Visit: Payer: PRIVATE HEALTH INSURANCE | Admitting: Cardiovascular Disease

## 2012-09-16 ENCOUNTER — Encounter: Payer: Self-pay | Admitting: Cardiovascular Disease

## 2012-09-16 ENCOUNTER — Ambulatory Visit (INDEPENDENT_AMBULATORY_CARE_PROVIDER_SITE_OTHER): Payer: PRIVATE HEALTH INSURANCE | Admitting: Cardiovascular Disease

## 2012-09-16 VITALS — BP 133/70 | HR 83 | Resp 18 | Ht 73.0 in | Wt 243.4 lb

## 2012-09-16 DIAGNOSIS — I2510 Atherosclerotic heart disease of native coronary artery without angina pectoris: Secondary | ICD-10-CM

## 2012-09-16 NOTE — Patient Instructions (Addendum)
Your physician wants you to follow-up in:  12 months.  You will receive a reminder letter in the mail two months in advance. If you don't receive a letter, please call our office to schedule the follow-up appointment.   

## 2012-09-16 NOTE — Progress Notes (Signed)
HPI:  76 year old gentleman presenting for followup evaluation. The patient is followed for coronary artery disease , peripheral arterial disease, hypertension, diastolic heart failure, and tobacco abuse.  He reports no major changes since his last office visit 6 months ago. He has some limitation with walking and this relates to gait unsteadiness and weakness. This is unchanged over time. He continues with chronic leg edema. The patient still smokes cigarettes despite extensive counseling. He denies chest pain, chest pressure, orthopnea, PND, or shortness of breath. He has multiple medical providers and still receives care through the [place] system.   Outpatient Encounter Prescriptions as of 09/16/2012  Medication Sig Dispense Refill  . ammonium lactate (AMLACTIN) 12 % cream as needed. Apply Once a Day      . aspirin 325 MG EC tablet Take 325 mg by mouth daily.        . carvedilol (COREG) 12.5 MG tablet Take 12.5 mg by mouth 2 (two) times daily with a meal.       . cilostazol (PLETAL) 50 MG tablet Take 50 mg by mouth daily.       . Cyanocobalamin (VITAMIN B 12 PO) Take 1 tablet by mouth daily. 1000 MCG       . DAILY MULTIPLE VITAMINS PO Take 1 tablet by mouth once a day.       . diltiazem (DILACOR XR) 120 MG 24 hr capsule Take 120 mg by mouth daily.        . divalproex (DEPAKOTE) 250 MG EC tablet 6 tablets everyday.       . finasteride (PROSCAR) 5 MG tablet Take 5 mg by mouth daily.        . furosemide (LASIX) 20 MG tablet Take 40 mg by mouth daily.       Marland Kitchen gabapentin (NEURONTIN) 300 MG capsule Take 300 mg by mouth 3 (three) times daily.      . insulin glargine (LANTUS) 100 UNIT/ML injection Inject 38 Units into the skin daily.        . insulin regular (HUMULIN R,NOVOLIN R) 100 units/mL injection Inject 8 Units into the skin daily before breakfast.        . latanoprost (XALATAN) 0.005 % ophthalmic solution Place 1 drop into both eyes at bedtime.        Marland Kitchen lisinopril (PRINIVIL,ZESTRIL) 40 MG tablet  40 mg daily.       . nitroGLYCERIN (NITRODUR - DOSED IN MG/24 HR) 0.4 mg/hr Apply 1 patch each morning and remove at bedtime.       . pantoprazole (PROTONIX) 40 MG tablet Take 40 mg by mouth daily.      . penbutolol (LEVATOL) 20 MG TABS Take 20 mg by mouth 2 (two) times daily.        . rosuvastatin (CRESTOR) 10 MG tablet Take 20 mg by mouth daily.       Marland Kitchen spironolactone (ALDACTONE) 25 MG tablet Take 25 mg by mouth daily.      . Tamsulosin HCl (FLOMAX) 0.4 MG CAPS Take 0.4 mg by mouth daily.        . travoprost, benzalkonium, (TRAVATAN) 0.004 % ophthalmic solution Place 1 drop into both eyes at bedtime.          Allergies  Allergen Reactions  . Penicillins     REACTION: hands and feet edema    Past Medical History  Diagnosis Date  . Hyperlipidemia   . HTN (hypertension)     echo 2/11: EF 50-55%, diast dyfxn, severe LVH,  inf HK, LAE  . CAD (coronary artery disease)     a.  NSTEMI treated with PCI Feb 2011 with a DES.(Endeavor);   b. cath 2/11: D1 stents x 2 ok, AV groove CFX occluded with dist AV CFX filled by L-L collats, RCA occluded, OM2 90-95% (treated with PCI)  . MI (myocardial infarction)   . PVD (peripheral vascular disease)   . DM (diabetes mellitus)   . GERD (gastroesophageal reflux disease)   . Bipolar disorder   . Prostate cancer     status post radiation therapy in 2003    ROS: Negative except as per HPI  BP 133/70  Pulse 83  Resp 18  Ht 6\' 1"  (1.854 m)  Wt 110.406 kg (243 lb 6.4 oz)  BMI 32.11 kg/m2  SpO2 93%  PHYSICAL EXAM: Pt is alert and oriented, NAD HEENT: normal Neck: JVP - normal, carotids 2+= without bruits Lungs: Expiratory wheezing bilaterally with prolonged expiratory phase  CV: RRR without murmur or gallop Abd: soft, NT, Positive BS, no hepatomegaly Ext: 2+ pitting bilateral pretibial edema, distal pulses intact and equal Skin: warm/dry no rash  EKG:  Sinus rhythm 80 beats per minute, right bundle branch block, occasional PACs and occasional  PVC.  ASESSMENT AND PLAN: 1. Coronary artery disease status post PCI. The patient remains on antiplatelet therapy with aspirin. He is on appropriate risk reduction program that includes a statin drug, beta blocker, and ACE inhibitor. Unfortunately he continues to smoke and I do not think he will quit. I'll see him back in 6 months.  2. Hypertension. Patient's blood pressure is controlled. He is on multidrug therapy with carvedilol, diltiazem, furosemide, lisinopril, and Aldactone. His last chemistry panel drawn in December 2012 showed a creatinine of 1.6 and potassium 4.0.  3. Chronic diastolic heart failure. I think the patient's lower extremity edema is multifactorial and there is a significant component of venous stasis. Will continue his diuretics without changes at this point. He does not have obvious evidence of volume overload or congestive symptoms.  4. Peripheral arterial disease. Stable without progressive limb ischemia symptoms. Tobacco cessation counseling was done.  5. Hyperlipidemia. The patient is on Crestor 20 mg daily. Lipids in our system from 2011 showed cholesterol of 128, triglycerides 82, HDL 57, and LDL 55. His lipid studies are updated through the [place] system, but I do not have records of these.  For followup, I will see this gentleman back in 6 months.  Tonny Bollman 09/16/2012 5:36 PM

## 2012-10-03 ENCOUNTER — Other Ambulatory Visit: Payer: Self-pay | Admitting: Cardiovascular Disease

## 2012-10-03 NOTE — Telephone Encounter (Signed)
..   Requested Prescriptions   Pending Prescriptions Disp Refills  . furosemide (LASIX) 40 MG tablet [Pharmacy Med Name: FUROSEMIDE 40 MG TABLET]  3    Sig: take 1 tablet by mouth once daily

## 2012-10-20 NOTE — Addendum Note (Signed)
Addended by: Marrion Coy L on: 10/20/2012 02:21 PM   Modules accepted: Orders

## 2012-11-28 ENCOUNTER — Telehealth: Payer: Self-pay | Admitting: Cardiovascular Disease

## 2012-11-28 NOTE — Telephone Encounter (Signed)
Medicaid CA# was not entered when pt checked in.  Spoke w/David Dan Humphreys, [facility] Desk who checked him in on 09-16-12.  Call transferred to [facility] who will speak w/pt about this.

## 2012-11-28 NOTE — Telephone Encounter (Signed)
plz return call to pt [phone], he needs a referral or over ride per doctor to cover insurance stops for payment.

## 2012-11-28 NOTE — Telephone Encounter (Signed)
[name],  I forwarded your note to Sue Lush.

## 2012-11-28 NOTE — Telephone Encounter (Signed)
F/u   Pt has called 6 times since 10 am, unable to reached Onalee Hua at this point.  Pt would like to speak with someone regarding this matter.

## 2012-11-28 NOTE — Telephone Encounter (Signed)
Will forward to this message to Pre-cert/billing to answer is questions regarding his insurance not paying for his visit.

## 2012-11-29 ENCOUNTER — Other Ambulatory Visit: Payer: Self-pay | Admitting: Internal Medicine

## 2012-11-29 ENCOUNTER — Ambulatory Visit
Admission: RE | Admit: 2012-11-29 | Discharge: 2012-11-29 | Disposition: A | Discharge status: Home / Self Care | Payer: Medicare Other | Source: Ambulatory Visit | Attending: Internal Medicine | Admitting: Internal Medicine

## 2012-11-29 DIAGNOSIS — R0602 Shortness of breath: Secondary | ICD-10-CM

## 2012-11-30 NOTE — Telephone Encounter (Signed)
This message was hand delivered to [facility] at the front desk. He will speak with Baron Sane (manager) about this patient's issues.

## 2012-11-30 NOTE — Telephone Encounter (Signed)
F/U   Patient calling back to check on the status his previous message that was sent around.

## 2012-12-01 ENCOUNTER — Telehealth: Payer: Self-pay | Admitting: Cardiovascular Disease

## 2012-12-27 ENCOUNTER — Telehealth: Payer: Self-pay | Admitting: Cardiovascular Disease

## 2012-12-27 NOTE — Telephone Encounter (Signed)
I spoke with the pt and made him aware that his last MI was 02/18/2010.  The pt also wanted me to cancel all follow-up scheduled with our office.  I asked the pt why and he said that he had another doctor that he would be seeing in [place].  I cancelled the recalls in the system.

## 2012-12-27 NOTE — Telephone Encounter (Signed)
New Problem:    Patient called in wanting to know what the exact date was of his last heart attack.  Please call back.

## 2013-01-26 ENCOUNTER — Telehealth: Payer: Self-pay | Admitting: Cardiovascular Disease

## 2013-01-26 NOTE — Telephone Encounter (Deleted)
Error

## 2013-01-26 NOTE — Telephone Encounter (Deleted)
New Problem:   Patient called in wanting to speak with you about a bill he has received.  Please call back.

## 2013-05-31 ENCOUNTER — Other Ambulatory Visit: Payer: Self-pay | Admitting: Internal Medicine

## 2013-05-31 DIAGNOSIS — I2510 Atherosclerotic heart disease of native coronary artery without angina pectoris: Secondary | ICD-10-CM

## 2013-05-31 DIAGNOSIS — E119 Type 2 diabetes mellitus without complications: Secondary | ICD-10-CM

## 2013-06-06 ENCOUNTER — Other Ambulatory Visit: Payer: Self-pay | Admitting: Internal Medicine

## 2013-06-06 ENCOUNTER — Ambulatory Visit
Admission: RE | Admit: 2013-06-06 | Discharge: 2013-06-06 | Disposition: A | Discharge status: Home / Self Care | Payer: PRIVATE HEALTH INSURANCE | Source: Ambulatory Visit | Attending: Internal Medicine | Admitting: Internal Medicine

## 2013-06-06 DIAGNOSIS — E119 Type 2 diabetes mellitus without complications: Secondary | ICD-10-CM

## 2013-06-06 DIAGNOSIS — I2510 Atherosclerotic heart disease of native coronary artery without angina pectoris: Secondary | ICD-10-CM

## 2013-06-16 ENCOUNTER — Telehealth: Payer: Self-pay | Admitting: Cardiovascular Disease

## 2013-06-16 NOTE — Telephone Encounter (Deleted)
error 

## 2013-06-20 ENCOUNTER — Ambulatory Visit: Payer: PRIVATE HEALTH INSURANCE | Admitting: Nurse Practitioner

## 2013-07-12 ENCOUNTER — Encounter: Payer: Self-pay | Admitting: Nurse Practitioner

## 2013-07-12 ENCOUNTER — Ambulatory Visit (INDEPENDENT_AMBULATORY_CARE_PROVIDER_SITE_OTHER): Payer: PRIVATE HEALTH INSURANCE | Admitting: Nurse Practitioner

## 2013-07-12 VITALS — BP 100/50 | HR 76 | Ht 74.0 in | Wt 220.0 lb

## 2013-07-12 DIAGNOSIS — I739 Peripheral vascular disease, unspecified: Secondary | ICD-10-CM

## 2013-07-12 DIAGNOSIS — I2510 Atherosclerotic heart disease of native coronary artery without angina pectoris: Secondary | ICD-10-CM

## 2013-07-12 NOTE — Progress Notes (Signed)
[name] Date of Birth: 1936-07-18 Medical Record #[id number]  History of Present Illness: [name] is seen back today for a follow up visit. Seen for [name]. He has a history of CAD with past NSTEMI with PCI in 2011, RBB, type 2 DM, CKD, GERD, BPH, depression, probable COPD, PVD with past left femoropopliteal bypass in 2008, HTN, diastolic heart failure and alcohol/tobacco abuse.   Last seen last September, was felt to be doing ok.  Comes back today. Here alone. Has seen his PCP last month - diabetes poorly controlled due to his poor diet/alcohol. Continues to smoke. Labs are checked by his PCP. Has had a vascular study performed last month - quite abnormal. He does report bilateral leg pain with walking but notes that it has actually improved and he can walk further since he has had gabapentin added to his medicines. One ulcer on the back of his right leg that he just found - less than the size of a dime. Chronic swelling of his left ankle. He does not know what medicines he takes. He drinks burbon - buys it by the half gallon about every 1 to 2 weeks. Still smoking. No chest pain with exertion. Some gas pain that is relieved with antacid.    Current Outpatient Prescriptions  Medication Sig Dispense Refill  . ammonium lactate (AMLACTIN) 12 % cream as needed. Apply Once a Day      . aspirin 325 MG EC tablet Take 325 mg by mouth daily.        . carvedilol (COREG) 12.5 MG tablet Take 12.5 mg by mouth 2 (two) times daily with a meal.       . Cyanocobalamin (VITAMIN B 12 PO) Take 1 tablet by mouth daily. 1000 MCG       . DAILY MULTIPLE VITAMINS PO Take 1 tablet by mouth once a day.       . divalproex (DEPAKOTE) 250 MG EC tablet 6 tablets everyday.       . finasteride (PROSCAR) 5 MG tablet Take 5 mg by mouth daily.        . furosemide (LASIX) 40 MG tablet take 1 tablet by mouth once daily  30 tablet  3  . gabapentin (NEURONTIN) 300 MG capsule Take 300 mg by mouth 3 (three) times  daily.      . insulin glargine (LANTUS) 100 UNIT/ML injection Inject 40 Units into the skin daily.       . insulin regular (HUMULIN R,NOVOLIN R) 100 units/mL injection Inject 8 Units into the skin 2 (two) times daily before a meal.       . lisinopril (PRINIVIL,ZESTRIL) 40 MG tablet 40 mg daily.       . nitroGLYCERIN (NITRODUR - DOSED IN MG/24 HR) 0.4 mg/hr Apply 1 patch each morning and remove at bedtime.       . rosuvastatin (CRESTOR) 10 MG tablet Take 20 mg by mouth daily.       Marland Kitchen spironolactone (ALDACTONE) 25 MG tablet Take 25 mg by mouth daily.      . Tamsulosin HCl (FLOMAX) 0.4 MG CAPS Take 0.4 mg by mouth daily.        . travoprost, benzalkonium, (TRAVATAN) 0.004 % ophthalmic solution Place 1 drop into both eyes at bedtime.         No current facility-administered medications for this visit.    Allergies  Allergen Reactions  . Penicillins     REACTION: hands and feet edema  Past Medical History  Diagnosis Date  . Hyperlipidemia   . HTN (hypertension)     echo 2/11: EF 50-55%, diast dyfxn, severe LVH, inf HK, LAE  . CAD (coronary artery disease)     a.  NSTEMI treated with PCI Feb 2011 with a DES.(Endeavor);   b. cath 2/11: D1 stents x 2 ok, AV groove CFX occluded with dist AV CFX filled by L-L collats, RCA occluded, OM2 90-95% (treated with PCI)  . MI (myocardial infarction)   . PVD (peripheral vascular disease)   . DM (diabetes mellitus)   . GERD (gastroesophageal reflux disease)   . Bipolar disorder   . Prostate cancer     status post radiation therapy in 2003    Past Surgical History  Procedure Laterality Date  . Femoral bypass  2003    History  Smoking status  . Current Every Day Smoker -- 0.50 packs/day for 50 years  . Types: Cigarettes  Smokeless tobacco  . Not on file    Comment: He has a 50-pack-year hx of tobacco abuse. Currently, smoking about half a pack a day.    History  Alcohol Use  . Yes    Comment: Previously drank heavily, and now has a  drink every other day or so.    Family History  Problem Relation Age of Onset  . Cancer Mother   . Cancer Father   . Cancer Sister     Review of Systems: The review of systems is per the HPI.  All other systems were reviewed and are negative.  Physical Exam: BP 100/50  Pulse 76  Ht 6\' 2"  (1.88 m)  Wt 220 lb (99.791 kg)  BMI 28.23 kg/m2 Patient is alert and in no acute distress. Smells of tobacco. Skin is warm and dry. Color is normal.  HEENT is unremarkable. Normocephalic/atraumatic. PERRL. Sclera are nonicteric. Neck is supple. No masses. No JVD. Lungs are coarse. Cardiac exam shows a regular rate and rhythm. Heart tones distant. Abdomen is soft. Extremities are ankle edema with the left worse than the right. Small half dime size ulcer on the back of the lower right leg. Has a scab over it and not draining. Gait and ROM are intact. No gross neurologic deficits noted.  LABORATORY DATA:   Vascular Study Findings:  At rest, the right ABI is 0.39, left 0.75. Segmental pressures suggest significant bilateral iliofemoral occlusive disease, left infrapopliteal and multisegmental right occlusive disease. Triphasic waveforms at the left femoral level, monophasic throughout their more distal left lower extremity and throughout the right lower extremity. Pulse volume recording is severely attenuated throughout the right lower extremity, mildly decreased on the left at the ankle. Exercise testing was not performed due to the low resting ABI.  IMPRESSION:  1. Severe right and moderately severe left lower extremity arterial occlusive disease at rest, as detailed above. Should the patient fail conservative treatment, consider CTA runoff (higher spatial resolution) or MRA runoff (no radiation risk, can be performed noncontrast in the setting of renal dysfunction) to better define the site and nature of arterial occlusive disease and delineate treatment options.   Original Report  Authenticated By: D. Andria Rhein, MD        Assessment / Plan: 1. PVD - with recent vascular study as noted above - has claudication/leg pain in both legs but says it has improved with gabapentin - has one small ulcer that he has just noted. Will ask Dr. Excell Seltzer review for further recommendations.   2. CAD -  no chest pain reported.    3. Ongoing substance abuse with tobacco and alcohol - not interested in stopping  4. HTN - BP is ok.   Will ask Dr. Excell Seltzer to review. Not sure we have much to offer given his multisubstance abuse.   Patient is agreeable to this plan and will call if any problems develop in the interim.   Rosalio Macadamia, RN, ANP-C [facility] [address] [place], [place]  [address]

## 2013-07-12 NOTE — Patient Instructions (Addendum)
I will ask Dr. Excell Seltzer to look at the test that was done on your legs looking at the blood flow and see if he wants to do any other testing.  Take good care of your legs - look for any unhealing ulcers/wounds  For now, stay on your current medicines - make sure the list you are given today matches up with what you are actually taking  Stopping smoking would help you greatly!  Call the [facility] office at [phone] if you have any questions, problems or concerns.

## 2013-09-22 ENCOUNTER — Encounter: Payer: Self-pay | Admitting: Cardiovascular Disease

## 2013-09-22 ENCOUNTER — Ambulatory Visit (INDEPENDENT_AMBULATORY_CARE_PROVIDER_SITE_OTHER): Payer: PRIVATE HEALTH INSURANCE | Admitting: Cardiovascular Disease

## 2013-09-22 VITALS — BP 134/70 | HR 75 | Ht 74.0 in | Wt 226.0 lb

## 2013-09-22 DIAGNOSIS — I2510 Atherosclerotic heart disease of native coronary artery without angina pectoris: Secondary | ICD-10-CM

## 2013-09-22 NOTE — Progress Notes (Signed)
HPI:   77 year old gentleman returning for followup evaluation. He was seen here in July by Norma Fredrickson. The patient has a history of coronary artery disease. He underwent PCI in 2000 when he presented with a non-ST elevation infarction. He has multiple other medical conditions including type 2 diabetes, chronic kidney disease, gastroesophageal reflux disease, COPD, peripheral arterial disease with history of left femoropopliteal bypass, hypertension, and tobacco and alcohol abuse.  When he was seen in July he complained of leg pain. He had a small ulcer on the right foot that has now healed up. Vascular studies demonstrated an ABI of 0.39 on the right and 0.75 on the left. Segmental pressures suggest a significant iliofemoral disease. He was also noted to have infrapopliteal and multisegmental disease on the right.  Overall the patient is stable at present. He has occasional chest pain but this is relieved with simethicone. He denies dyspnea. He continues to smoke cigarettes. He also uses vaporized cigarettes. He drinks pretty heavily. He doesn't seem too interested in quitting. He has chronic leg swelling, worse on the left. There's been no recent change.  Outpatient Encounter Prescriptions as of 09/22/2013  Medication Sig Dispense Refill  . ammonium lactate (AMLACTIN) 12 % cream as needed. Apply Once a Day      . aspirin 325 MG EC tablet Take 325 mg by mouth daily.        . carvedilol (COREG) 12.5 MG tablet Take 12.5 mg by mouth 2 (two) times daily with a meal.       . Cyanocobalamin (VITAMIN B 12 PO) Take 1 tablet by mouth daily. 1000 MCG       . DAILY MULTIPLE VITAMINS PO Take 1 tablet by mouth once a day.       . divalproex (DEPAKOTE) 250 MG EC tablet 6 tablets everyday.       . finasteride (PROSCAR) 5 MG tablet Take 5 mg by mouth daily.        . furosemide (LASIX) 40 MG tablet take 1 tablet by mouth once daily  30 tablet  3  . gabapentin (NEURONTIN) 300 MG capsule Take 300 mg by mouth 3  (three) times daily.      . insulin glargine (LANTUS) 100 UNIT/ML injection Inject 40 Units into the skin daily.       . insulin regular (HUMULIN R,NOVOLIN R) 100 units/mL injection Inject 8 Units into the skin 2 (two) times daily before a meal.       . lisinopril (PRINIVIL,ZESTRIL) 40 MG tablet 40 mg daily.       . nitroGLYCERIN (NITRODUR - DOSED IN MG/24 HR) 0.4 mg/hr Apply 1 patch each morning and remove at bedtime.       Marland Kitchen spironolactone (ALDACTONE) 25 MG tablet Take 25 mg by mouth daily.      . Tamsulosin HCl (FLOMAX) 0.4 MG CAPS Take 0.4 mg by mouth daily.        . travoprost, benzalkonium, (TRAVATAN) 0.004 % ophthalmic solution Place 1 drop into both eyes at bedtime.        . [DISCONTINUED] rosuvastatin (CRESTOR) 10 MG tablet Take 20 mg by mouth daily.        No facility-administered encounter medications on file as of 09/22/2013.    Allergies  Allergen Reactions  . Penicillins     REACTION: hands and feet edema    Past Medical History  Diagnosis Date  . Hyperlipidemia   . HTN (hypertension)     echo 2/11: EF 50-55%,  diast dyfxn, severe LVH, inf HK, LAE  . CAD (coronary artery disease)     a.  NSTEMI treated with PCI Feb 2011 with a DES.([facility]);   b. cath 2/11: D1 stents x 2 ok, AV groove CFX occluded with dist AV CFX filled by L-L collats, RCA occluded, OM2 90-95% (treated with PCI)  . MI (myocardial infarction)   . PVD (peripheral vascular disease)   . DM (diabetes mellitus)   . GERD (gastroesophageal reflux disease)   . Bipolar disorder   . Prostate cancer     status post radiation therapy in 2003    ROS: Negative except as per HPI  Ht 6\' 2"  (1.88 m)  Wt 102.513 kg (226 lb)  BMI 29 kg/m2  PHYSICAL EXAM: Pt is alert and oriented, NAD HEENT: normal Neck: JVP - normal, carotids 2+= without bruits Lungs: CTA bilaterally CV: RRR without murmur or gallop Abd: soft, NT, Positive BS, no hepatomegaly Ext: Trace edema on the right, 2+ edema of the left ankle, distal  pulses nonpalpable Skin: warm/dry no rash  EKG:  Sinus rhythm with occasional PVCs, right bundle branch block.  ASSESSMENT AND PLAN: 1. Coronary artery disease, native vessel. The patient is stable without anginal symptoms. His medications are appropriate with aspirin and a statin drug.   2. Peripheral arterial disease. He understands that ongoing heavy tobacco use will lead to a worse outcome. I don't see any reason to proceed with invasive angiography or revascularization at this point. He is not particularly limited by leg pain. There are no ulcers on his exam today. He knows he needs to stop smoking.  3. Hypertension. Will continue current medical therapy.  4. Tobacco abuse. He was counseled about cessation.  Tonny Bollman 09/22/2013 3:47 PM

## 2013-09-22 NOTE — Patient Instructions (Addendum)
Your physician wants you to follow-up in: 1 YEAR with Dr Excell Seltzer, if the pt is doing well then Dr Excell Seltzer said the pt can follow up as needed.  You will receive a reminder letter in the mail two months in advance. If you don't receive a letter, please call our office to schedule the follow-up appointment.  Your physician recommends that you continue on your current medications as directed. Please refer to the Current Medication list given to you today.

## 2013-09-25 ENCOUNTER — Telehealth: Payer: Self-pay | Admitting: Cardiovascular Disease

## 2013-09-25 NOTE — Telephone Encounter (Signed)
New problem    Fax copy of last office visit to   - patient is stating to put on the fax cover sheet that you are able Viagra or celisis.   [facility]  # [phone] .   Dr. Jeannine Boga fax # [phone].

## 2013-09-26 NOTE — Telephone Encounter (Signed)
Addendum added to 09/22/13 office note in regards to ED drugs.  [facility] note faxed per the pt's request.  I left a voicemail to advise the pt that this note has been faxed.

## 2013-09-26 NOTE — Progress Notes (Signed)
Per Dr Excell Seltzer the pt can take erectile dysfunction medications. This should be addressed by the pt's PCP.

## 2013-10-13 ENCOUNTER — Encounter: Payer: Self-pay | Admitting: Cardiovascular Disease

## 2013-10-13 NOTE — Telephone Encounter (Signed)
This encounter was created in error - please disregard.

## 2013-10-13 NOTE — Telephone Encounter (Signed)
New  Message    PCP faxed request to get info from us---They have not heard from us-----Did we get the form?  [place]

## 2014-02-09 ENCOUNTER — Telehealth: Payer: Self-pay | Admitting: Cardiovascular Disease

## 2014-02-09 NOTE — Telephone Encounter (Signed)
New message   Looking for recent EJ fractions.

## 2014-02-09 NOTE — Telephone Encounter (Signed)
Left detailed message on voicemail re: pt's EF per last office note from Dr Burt Knack from 08/2013.

## 2014-06-24 ENCOUNTER — Emergency Department (HOSPITAL_COMMUNITY): Payer: PRIVATE HEALTH INSURANCE

## 2014-06-24 ENCOUNTER — Encounter (HOSPITAL_COMMUNITY): Payer: Self-pay | Admitting: Emergency Medicine

## 2014-06-24 ENCOUNTER — Inpatient Hospital Stay (HOSPITAL_COMMUNITY)
Admission: EM | Admit: 2014-06-24 | Discharge: 2014-06-29 | DRG: 208 | Disposition: A | Discharge status: Home Health | Payer: PRIVATE HEALTH INSURANCE | Attending: Internal Medicine | Admitting: Internal Medicine

## 2014-06-24 ENCOUNTER — Inpatient Hospital Stay (HOSPITAL_COMMUNITY): Payer: PRIVATE HEALTH INSURANCE

## 2014-06-24 DIAGNOSIS — R609 Edema, unspecified: Secondary | ICD-10-CM | POA: Diagnosis present

## 2014-06-24 DIAGNOSIS — I4510 Unspecified right bundle-branch block: Secondary | ICD-10-CM | POA: Diagnosis present

## 2014-06-24 DIAGNOSIS — I5033 Acute on chronic diastolic (congestive) heart failure: Secondary | ICD-10-CM | POA: Diagnosis present

## 2014-06-24 DIAGNOSIS — I509 Heart failure, unspecified: Secondary | ICD-10-CM | POA: Diagnosis present

## 2014-06-24 DIAGNOSIS — I872 Venous insufficiency (chronic) (peripheral): Secondary | ICD-10-CM | POA: Diagnosis present

## 2014-06-24 DIAGNOSIS — E119 Type 2 diabetes mellitus without complications: Secondary | ICD-10-CM | POA: Diagnosis present

## 2014-06-24 DIAGNOSIS — E785 Hyperlipidemia, unspecified: Secondary | ICD-10-CM | POA: Diagnosis present

## 2014-06-24 DIAGNOSIS — I2510 Atherosclerotic heart disease of native coronary artery without angina pectoris: Secondary | ICD-10-CM | POA: Diagnosis present

## 2014-06-24 DIAGNOSIS — I10 Essential (primary) hypertension: Secondary | ICD-10-CM

## 2014-06-24 DIAGNOSIS — Z91199 Patient's noncompliance with other medical treatment and regimen due to unspecified reason: Secondary | ICD-10-CM | POA: Diagnosis not present

## 2014-06-24 DIAGNOSIS — N179 Acute kidney failure, unspecified: Secondary | ICD-10-CM | POA: Diagnosis present

## 2014-06-24 DIAGNOSIS — J810 Acute pulmonary edema: Secondary | ICD-10-CM

## 2014-06-24 DIAGNOSIS — E872 Acidosis, unspecified: Secondary | ICD-10-CM | POA: Diagnosis present

## 2014-06-24 DIAGNOSIS — J9601 Acute respiratory failure with hypoxia: Secondary | ICD-10-CM

## 2014-06-24 DIAGNOSIS — Z7982 Long term (current) use of aspirin: Secondary | ICD-10-CM | POA: Diagnosis not present

## 2014-06-24 DIAGNOSIS — N400 Enlarged prostate without lower urinary tract symptoms: Secondary | ICD-10-CM | POA: Diagnosis present

## 2014-06-24 DIAGNOSIS — I5032 Chronic diastolic (congestive) heart failure: Secondary | ICD-10-CM | POA: Diagnosis present

## 2014-06-24 DIAGNOSIS — I739 Peripheral vascular disease, unspecified: Secondary | ICD-10-CM | POA: Diagnosis present

## 2014-06-24 DIAGNOSIS — Z8546 Personal history of malignant neoplasm of prostate: Secondary | ICD-10-CM

## 2014-06-24 DIAGNOSIS — G9340 Encephalopathy, unspecified: Secondary | ICD-10-CM | POA: Diagnosis present

## 2014-06-24 DIAGNOSIS — F17200 Nicotine dependence, unspecified, uncomplicated: Secondary | ICD-10-CM | POA: Diagnosis present

## 2014-06-24 DIAGNOSIS — I252 Old myocardial infarction: Secondary | ICD-10-CM

## 2014-06-24 DIAGNOSIS — J441 Chronic obstructive pulmonary disease with (acute) exacerbation: Principal | ICD-10-CM | POA: Diagnosis present

## 2014-06-24 DIAGNOSIS — Z794 Long term (current) use of insulin: Secondary | ICD-10-CM | POA: Diagnosis not present

## 2014-06-24 DIAGNOSIS — I214 Non-ST elevation (NSTEMI) myocardial infarction: Secondary | ICD-10-CM | POA: Diagnosis present

## 2014-06-24 DIAGNOSIS — I129 Hypertensive chronic kidney disease with stage 1 through stage 4 chronic kidney disease, or unspecified chronic kidney disease: Secondary | ICD-10-CM | POA: Diagnosis present

## 2014-06-24 DIAGNOSIS — D72829 Elevated white blood cell count, unspecified: Secondary | ICD-10-CM | POA: Diagnosis not present

## 2014-06-24 DIAGNOSIS — J811 Chronic pulmonary edema: Secondary | ICD-10-CM | POA: Diagnosis present

## 2014-06-24 DIAGNOSIS — Z9861 Coronary angioplasty status: Secondary | ICD-10-CM | POA: Diagnosis not present

## 2014-06-24 DIAGNOSIS — T380X5A Adverse effect of glucocorticoids and synthetic analogues, initial encounter: Secondary | ICD-10-CM | POA: Diagnosis not present

## 2014-06-24 DIAGNOSIS — I4891 Unspecified atrial fibrillation: Secondary | ICD-10-CM | POA: Diagnosis present

## 2014-06-24 DIAGNOSIS — J9600 Acute respiratory failure, unspecified whether with hypoxia or hypercapnia: Secondary | ICD-10-CM | POA: Diagnosis present

## 2014-06-24 DIAGNOSIS — I219 Acute myocardial infarction, unspecified: Secondary | ICD-10-CM

## 2014-06-24 DIAGNOSIS — N189 Chronic kidney disease, unspecified: Secondary | ICD-10-CM | POA: Diagnosis present

## 2014-06-24 DIAGNOSIS — I5021 Acute systolic (congestive) heart failure: Secondary | ICD-10-CM

## 2014-06-24 DIAGNOSIS — R1310 Dysphagia, unspecified: Secondary | ICD-10-CM | POA: Diagnosis not present

## 2014-06-24 DIAGNOSIS — K219 Gastro-esophageal reflux disease without esophagitis: Secondary | ICD-10-CM | POA: Diagnosis present

## 2014-06-24 DIAGNOSIS — I161 Hypertensive emergency: Secondary | ICD-10-CM

## 2014-06-24 DIAGNOSIS — R0602 Shortness of breath: Secondary | ICD-10-CM | POA: Diagnosis present

## 2014-06-24 DIAGNOSIS — Z923 Personal history of irradiation: Secondary | ICD-10-CM | POA: Diagnosis not present

## 2014-06-24 DIAGNOSIS — F319 Bipolar disorder, unspecified: Secondary | ICD-10-CM | POA: Diagnosis present

## 2014-06-24 DIAGNOSIS — R0603 Acute respiratory distress: Secondary | ICD-10-CM

## 2014-06-24 DIAGNOSIS — I482 Chronic atrial fibrillation, unspecified: Secondary | ICD-10-CM | POA: Diagnosis present

## 2014-06-24 DIAGNOSIS — E1129 Type 2 diabetes mellitus with other diabetic kidney complication: Secondary | ICD-10-CM | POA: Diagnosis present

## 2014-06-24 DIAGNOSIS — Z9119 Patient's noncompliance with other medical treatment and regimen: Secondary | ICD-10-CM

## 2014-06-24 DIAGNOSIS — Z720 Tobacco use: Secondary | ICD-10-CM | POA: Diagnosis present

## 2014-06-24 DIAGNOSIS — J9602 Acute respiratory failure with hypercapnia: Secondary | ICD-10-CM

## 2014-06-24 HISTORY — DX: Hypertensive emergency: I16.1

## 2014-06-24 HISTORY — DX: Unspecified atrial fibrillation: I48.91

## 2014-06-24 HISTORY — DX: Acute respiratory distress: R06.03

## 2014-06-24 HISTORY — DX: Chronic pulmonary edema: J81.1

## 2014-06-24 LAB — POCT I-STAT 3, ART BLOOD GAS (G3+)
Acid-Base Excess: 1 mmol/L (ref 0.0–2.0)
Bicarbonate: 28.3 mEq/L — ABNORMAL HIGH (ref 20.0–24.0)
O2 Saturation: 93 %
PH ART: 7.308 — AB (ref 7.350–7.450)
TCO2: 30 mmol/L (ref 0–100)
pCO2 arterial: 56.7 mmHg — ABNORMAL HIGH (ref 35.0–45.0)
pO2, Arterial: 74 mmHg — ABNORMAL LOW (ref 80.0–100.0)

## 2014-06-24 LAB — COMPREHENSIVE METABOLIC PANEL
ALBUMIN: 3.5 g/dL (ref 3.5–5.2)
ALT: 26 U/L (ref 0–53)
ALT: 34 U/L (ref 0–53)
AST: 46 U/L — AB (ref 0–37)
AST: 59 U/L — ABNORMAL HIGH (ref 0–37)
Albumin: 3.2 g/dL — ABNORMAL LOW (ref 3.5–5.2)
Alkaline Phosphatase: 52 U/L (ref 39–117)
Alkaline Phosphatase: 49 U/L (ref 39–117)
BILIRUBIN TOTAL: 0.6 mg/dL (ref 0.3–1.2)
BUN: 12 mg/dL (ref 6–23)
BUN: 15 mg/dL (ref 6–23)
CO2: 25 mEq/L (ref 19–32)
CO2: 24 meq/L (ref 19–32)
CREATININE: 1.26 mg/dL (ref 0.50–1.35)
CREATININE: 1.28 mg/dL (ref 0.50–1.35)
Calcium: 9.1 mg/dL (ref 8.4–10.5)
Calcium: 8.9 mg/dL (ref 8.4–10.5)
Chloride: 96 mEq/L (ref 96–112)
Chloride: 98 mEq/L (ref 96–112)
GFR calc Af Amer: 63 mL/min — ABNORMAL LOW (ref >90)
GFR calc non Af Amer: 54 mL/min — ABNORMAL LOW (ref >90)
GFR, EST AFRICAN AMERICAN: 61 mL/min — AB (ref >90)
GFR, EST NON AFRICAN AMERICAN: 53 mL/min — AB (ref >90)
Glucose, Bld: 264 mg/dL — ABNORMAL HIGH (ref 70–99)
Glucose, Bld: 253 mg/dL — ABNORMAL HIGH (ref 70–99)
POTASSIUM: 5.1 meq/L (ref 3.7–5.3)
Potassium: 5 mEq/L (ref 3.7–5.3)
Sodium: 138 mEq/L (ref 137–147)
Sodium: 139 mEq/L (ref 137–147)
Total Bilirubin: 0.6 mg/dL (ref 0.3–1.2)
Total Protein: 8.5 g/dL — ABNORMAL HIGH (ref 6.0–8.3)
Total Protein: 7.7 g/dL (ref 6.0–8.3)

## 2014-06-24 LAB — I-STAT ARTERIAL BLOOD GAS, ED
Acid-base deficit: 2 mmol/L (ref 0.0–2.0)
Bicarbonate: 29.3 mEq/L — ABNORMAL HIGH (ref 20.0–24.0)
O2 Saturation: 99 %
PH ART: 7.194 — AB (ref 7.350–7.450)
TCO2: 32 mmol/L (ref 0–100)
pCO2 arterial: 75.8 mmHg (ref 35.0–45.0)
pO2, Arterial: 182 mmHg — ABNORMAL HIGH (ref 80.0–100.0)

## 2014-06-24 LAB — CBC WITH DIFFERENTIAL/PLATELET
BASOS ABS: 0.1 10*3/uL (ref 0.0–0.1)
BASOS PCT: 1 % (ref 0–1)
BASOS PCT: 0 % (ref 0–1)
Basophils Absolute: 0 10*3/uL (ref 0.0–0.1)
Eosinophils Absolute: 0.2 10*3/uL (ref 0.0–0.7)
Eosinophils Absolute: 0 10*3/uL (ref 0.0–0.7)
Eosinophils Relative: 3 % (ref 0–5)
Eosinophils Relative: 0 % (ref 0–5)
HEMATOCRIT: 47.9 % (ref 39.0–52.0)
HEMATOCRIT: 43.5 % (ref 39.0–52.0)
HEMOGLOBIN: 15.6 g/dL (ref 13.0–17.0)
Hemoglobin: 14.6 g/dL (ref 13.0–17.0)
Lymphocytes Relative: 41 % (ref 12–46)
Lymphocytes Relative: 7 % — ABNORMAL LOW (ref 12–46)
Lymphs Abs: 3.8 10*3/uL (ref 0.7–4.0)
Lymphs Abs: 0.5 10*3/uL — ABNORMAL LOW (ref 0.7–4.0)
MCH: 30.3 pg (ref 26.0–34.0)
MCH: 30.6 pg (ref 26.0–34.0)
MCHC: 32.6 g/dL (ref 30.0–36.0)
MCHC: 33.6 g/dL (ref 30.0–36.0)
MCV: 93 fL (ref 78.0–100.0)
MCV: 91.2 fL (ref 78.0–100.0)
MONO ABS: 0.2 10*3/uL (ref 0.1–1.0)
Monocytes Absolute: 0.9 10*3/uL (ref 0.1–1.0)
Monocytes Relative: 9 % (ref 3–12)
Monocytes Relative: 3 % (ref 3–12)
NEUTROS ABS: 6.6 10*3/uL (ref 1.7–7.7)
NEUTROS PCT: 91 % — AB (ref 43–77)
Neutro Abs: 4.4 10*3/uL (ref 1.7–7.7)
Neutrophils Relative %: 46 % (ref 43–77)
PLATELETS: 203 10*3/uL (ref 150–400)
Platelets: 232 10*3/uL (ref 150–400)
RBC: 5.15 MIL/uL (ref 4.22–5.81)
RBC: 4.77 MIL/uL (ref 4.22–5.81)
RDW: 15.1 % (ref 11.5–15.5)
RDW: 15.1 % (ref 11.5–15.5)
WBC: 9.4 10*3/uL (ref 4.0–10.5)
WBC: 7.3 10*3/uL (ref 4.0–10.5)

## 2014-06-24 LAB — PRO B NATRIURETIC PEPTIDE
PRO B NATRI PEPTIDE: 4355 pg/mL — AB (ref 0–450)
Pro B Natriuretic peptide (BNP): 5446 pg/mL — ABNORMAL HIGH (ref 0–450)

## 2014-06-24 LAB — I-STAT CHEM 8, ED
BUN: 13 mg/dL (ref 6–23)
CHLORIDE: 97 meq/L (ref 96–112)
Calcium, Ion: 1.22 mmol/L (ref 1.13–1.30)
Creatinine, Ser: 1.5 mg/dL — ABNORMAL HIGH (ref 0.50–1.35)
GLUCOSE: 268 mg/dL — AB (ref 70–99)
HCT: 56 % — ABNORMAL HIGH (ref 39.0–52.0)
Hemoglobin: 19 g/dL — ABNORMAL HIGH (ref 13.0–17.0)
Potassium: 4.7 mEq/L (ref 3.7–5.3)
Sodium: 141 mEq/L (ref 137–147)
TCO2: 32 mmol/L (ref 0–100)

## 2014-06-24 LAB — URINALYSIS, ROUTINE W REFLEX MICROSCOPIC
Bilirubin Urine: NEGATIVE
GLUCOSE, UA: NEGATIVE mg/dL
KETONES UR: NEGATIVE mg/dL
Leukocytes, UA: NEGATIVE
Nitrite: NEGATIVE
PH: 5.5 (ref 5.0–8.0)
Protein, ur: 100 mg/dL — AB
Specific Gravity, Urine: 1.013 (ref 1.005–1.030)
Urobilinogen, UA: 1 mg/dL (ref 0.0–1.0)

## 2014-06-24 LAB — RAPID URINE DRUG SCREEN, HOSP PERFORMED
Amphetamines: NOT DETECTED
BARBITURATES: NOT DETECTED
Benzodiazepines: NOT DETECTED
Cocaine: NOT DETECTED
OPIATES: NOT DETECTED
TETRAHYDROCANNABINOL: POSITIVE — AB

## 2014-06-24 LAB — STREP PNEUMONIAE URINARY ANTIGEN: STREP PNEUMO URINARY ANTIGEN: NEGATIVE

## 2014-06-24 LAB — PROCALCITONIN: Procalcitonin: 0.16 ng/mL

## 2014-06-24 LAB — URINE MICROSCOPIC-ADD ON

## 2014-06-24 LAB — GLUCOSE, CAPILLARY
Glucose-Capillary: 243 mg/dL — ABNORMAL HIGH (ref 70–99)
Glucose-Capillary: 203 mg/dL — ABNORMAL HIGH (ref 70–99)

## 2014-06-24 LAB — CK TOTAL AND CKMB (NOT AT ARMC)
CK, MB: 5.3 ng/mL — ABNORMAL HIGH (ref 0.3–4.0)
Relative Index: 3.5 — ABNORMAL HIGH (ref 0.0–2.5)
Total CK: 150 U/L (ref 7–232)

## 2014-06-24 LAB — MAGNESIUM: Magnesium: 1.4 mg/dL — ABNORMAL LOW (ref 1.5–2.5)

## 2014-06-24 LAB — TROPONIN I
TROPONIN I: 0.32 ng/mL — AB (ref <0.30)
TROPONIN I: 2.52 ng/mL — AB (ref <0.30)
Troponin I: <0.3 ng/mL (ref <0.30)

## 2014-06-24 LAB — PROTIME-INR
INR: 1.08 (ref 0.00–1.49)
INR: 1.08 (ref 0.00–1.49)
Prothrombin Time: 14 seconds (ref 11.6–15.2)
Prothrombin Time: 14 seconds (ref 11.6–15.2)

## 2014-06-24 LAB — MRSA PCR SCREENING: MRSA by PCR: NEGATIVE

## 2014-06-24 LAB — CBG MONITORING, ED: GLUCOSE-CAPILLARY: 257 mg/dL — AB (ref 70–99)

## 2014-06-24 LAB — I-STAT CG4 LACTIC ACID, ED: Lactic Acid, Venous: 3.56 mmol/L — ABNORMAL HIGH (ref 0.5–2.2)

## 2014-06-24 LAB — LACTIC ACID, PLASMA: Lactic Acid, Venous: 3.3 mmol/L — ABNORMAL HIGH (ref 0.5–2.2)

## 2014-06-24 MED ORDER — ETOMIDATE 2 MG/ML IV SOLN
INTRAVENOUS | Status: DC | PRN
  Administered 2014-06-24: 20 mg via INTRAVENOUS

## 2014-06-24 MED ORDER — PANTOPRAZOLE SODIUM 40 MG IV SOLR
40.0000 mg | Freq: Every day | INTRAVENOUS | Status: DC
  Administered 2014-06-24 – 2014-06-26 (×3): 40 mg via INTRAVENOUS
  Filled 2014-06-24 (×4): qty 40

## 2014-06-24 MED ORDER — INSULIN ASPART 100 UNIT/ML ~~LOC~~ SOLN
0.0000 [IU] | SUBCUTANEOUS | Status: DC
Start: 2014-06-24 — End: 2014-06-27
  Administered 2014-06-24 (×2): 7 [IU] via SUBCUTANEOUS
  Administered 2014-06-24: 4 [IU] via SUBCUTANEOUS
  Administered 2014-06-25: 7 [IU] via SUBCUTANEOUS
  Administered 2014-06-25: 4 [IU] via SUBCUTANEOUS
  Administered 2014-06-25: 7 [IU] via SUBCUTANEOUS
  Administered 2014-06-25: 20 [IU] via SUBCUTANEOUS
  Administered 2014-06-25: 4 [IU] via SUBCUTANEOUS
  Administered 2014-06-26: 20 [IU] via SUBCUTANEOUS
  Administered 2014-06-26: 15 [IU] via SUBCUTANEOUS
  Administered 2014-06-26: 11 [IU] via SUBCUTANEOUS
  Administered 2014-06-26: 15 [IU] via SUBCUTANEOUS
  Administered 2014-06-26 (×2): 7 [IU] via SUBCUTANEOUS
  Administered 2014-06-27: 11 [IU] via SUBCUTANEOUS
  Administered 2014-06-27: 7 [IU] via SUBCUTANEOUS
  Administered 2014-06-27: 4 [IU] via SUBCUTANEOUS

## 2014-06-24 MED ORDER — ASPIRIN 81 MG PO CHEW
324.0000 mg | CHEWABLE_TABLET | ORAL | Status: AC
  Filled 2014-06-24: qty 4

## 2014-06-24 MED ORDER — PROPOFOL 10 MG/ML IV EMUL
INTRAVENOUS | Status: AC
  Administered 2014-06-24: 15 ug/kg/min via INTRAVENOUS
  Filled 2014-06-24: qty 100

## 2014-06-24 MED ORDER — CARVEDILOL 12.5 MG PO TABS
12.5000 mg | ORAL_TABLET | Freq: Two times a day (BID) | ORAL | Status: DC
  Administered 2014-06-24 – 2014-06-29 (×10): 12.5 mg via ORAL
  Filled 2014-06-24 (×12): qty 1

## 2014-06-24 MED ORDER — HEPARIN SODIUM (PORCINE) 5000 UNIT/ML IJ SOLN
5000.0000 [IU] | Freq: Three times a day (TID) | INTRAMUSCULAR | Status: DC
  Administered 2014-06-24 (×2): 5000 [IU] via SUBCUTANEOUS
  Filled 2014-06-24 (×3): qty 1

## 2014-06-24 MED ORDER — FENTANYL CITRATE 0.05 MG/ML IJ SOLN: 50.0000 ug | Freq: Once | INTRAMUSCULAR | Status: DC

## 2014-06-24 MED ORDER — DILTIAZEM HCL ER COATED BEADS 120 MG PO TB24: 120.0000 mg | ORAL_TABLET | Freq: Every day | ORAL | Status: DC

## 2014-06-24 MED ORDER — CHLORHEXIDINE GLUCONATE 0.12 % MT SOLN
15.0000 mL | Freq: Two times a day (BID) | OROMUCOSAL | Status: DC
  Administered 2014-06-24 – 2014-06-26 (×4): 15 mL via OROMUCOSAL
  Administered 2014-06-26: 7 mL via OROMUCOSAL
  Administered 2014-06-27 – 2014-06-29 (×5): 15 mL via OROMUCOSAL
  Filled 2014-06-24 (×11): qty 15

## 2014-06-24 MED ORDER — ASPIRIN 300 MG RE SUPP: 300.0000 mg | Freq: Once | RECTAL | Status: DC

## 2014-06-24 MED ORDER — GABAPENTIN 300 MG PO CAPS
300.0000 mg | ORAL_CAPSULE | Freq: Three times a day (TID) | ORAL | Status: DC
  Administered 2014-06-24 – 2014-06-29 (×15): 300 mg via ORAL
  Filled 2014-06-24 (×17): qty 1

## 2014-06-24 MED ORDER — MIDAZOLAM HCL 2 MG/2ML IJ SOLN
2.0000 mg | Freq: Once | INTRAMUSCULAR | Status: AC
  Administered 2014-06-24: 2 mg via INTRAVENOUS
  Filled 2014-06-24: qty 2

## 2014-06-24 MED ORDER — DOXYCYCLINE HYCLATE 100 MG PO TABS: 100.0000 mg | ORAL_TABLET | Freq: Two times a day (BID) | ORAL | Status: DC

## 2014-06-24 MED ORDER — INSULIN GLARGINE 100 UNIT/ML ~~LOC~~ SOLN
20.0000 [IU] | Freq: Every day | SUBCUTANEOUS | Status: DC
  Filled 2014-06-24: qty 0.2

## 2014-06-24 MED ORDER — TRAVOPROST 0.004 % OP SOLN: 1.0000 [drp] | Freq: Every day | OPHTHALMIC | Status: DC

## 2014-06-24 MED ORDER — FUROSEMIDE 10 MG/ML IJ SOLN
40.0000 mg | Freq: Once | INTRAMUSCULAR | Status: AC
  Administered 2014-06-24: 40 mg via INTRAVENOUS
  Filled 2014-06-24: qty 4

## 2014-06-24 MED ORDER — INSULIN GLARGINE 100 UNIT/ML ~~LOC~~ SOLN
10.0000 [IU] | Freq: Every day | SUBCUTANEOUS | Status: DC
Start: 2014-06-24 — End: 2014-06-25
  Administered 2014-06-24: 10 [IU] via SUBCUTANEOUS
  Filled 2014-06-24 (×2): qty 0.1

## 2014-06-24 MED ORDER — FENTANYL BOLUS VIA INFUSION
50.0000 ug | INTRAVENOUS | Status: DC | PRN
  Filled 2014-06-24: qty 200

## 2014-06-24 MED ORDER — LATANOPROST 0.005 % OP SOLN
1.0000 [drp] | Freq: Every day | OPHTHALMIC | Status: DC
  Administered 2014-06-24 – 2014-06-28 (×5): 1 [drp] via OPHTHALMIC
  Filled 2014-06-24 (×2): qty 2.5

## 2014-06-24 MED ORDER — ROCURONIUM BROMIDE 50 MG/5ML IV SOLN
INTRAVENOUS | Status: DC | PRN
  Administered 2014-06-24: 100 mg via INTRAVENOUS

## 2014-06-24 MED ORDER — LIDOCAINE HCL (CARDIAC) 20 MG/ML IV SOLN
INTRAVENOUS | Status: AC
  Filled 2014-06-24: qty 5

## 2014-06-24 MED ORDER — METHYLPREDNISOLONE SODIUM SUCC 125 MG IJ SOLR
60.0000 mg | Freq: Two times a day (BID) | INTRAMUSCULAR | Status: DC
  Administered 2014-06-24: 60 mg via INTRAVENOUS
  Filled 2014-06-24 (×3): qty 0.96

## 2014-06-24 MED ORDER — HEPARIN (PORCINE) IN NACL 100-0.45 UNIT/ML-% IJ SOLN
1100.0000 [IU]/h | INTRAMUSCULAR | Status: DC
  Administered 2014-06-25 – 2014-06-26 (×2): 1100 [IU]/h via INTRAVENOUS
  Administered 2014-06-26: 900 [IU]/h via INTRAVENOUS
  Filled 2014-06-24 (×5): qty 250

## 2014-06-24 MED ORDER — SUCCINYLCHOLINE CHLORIDE 20 MG/ML IJ SOLN
INTRAMUSCULAR | Status: AC
  Filled 2014-06-24: qty 1

## 2014-06-24 MED ORDER — DILTIAZEM HCL ER COATED BEADS 120 MG PO CP24
120.0000 mg | ORAL_CAPSULE | Freq: Every day | ORAL | Status: DC
  Filled 2014-06-24: qty 1

## 2014-06-24 MED ORDER — HEPARIN BOLUS VIA INFUSION
4000.0000 [IU] | Freq: Once | INTRAVENOUS | Status: AC
  Administered 2014-06-25: 4000 [IU] via INTRAVENOUS
  Filled 2014-06-24: qty 4000

## 2014-06-24 MED ORDER — DOXYCYCLINE HYCLATE 100 MG IV SOLR
100.0000 mg | Freq: Two times a day (BID) | INTRAVENOUS | Status: DC
  Administered 2014-06-24 – 2014-06-26 (×4): 100 mg via INTRAVENOUS
  Filled 2014-06-24 (×6): qty 100

## 2014-06-24 MED ORDER — FENTANYL BOLUS VIA INFUSION
25.0000 ug | INTRAVENOUS | Status: DC | PRN
  Filled 2014-06-24: qty 50

## 2014-06-24 MED ORDER — ETOMIDATE 2 MG/ML IV SOLN
INTRAVENOUS | Status: AC
  Filled 2014-06-24: qty 20

## 2014-06-24 MED ORDER — ALBUTEROL SULFATE (2.5 MG/3ML) 0.083% IN NEBU
INHALATION_SOLUTION | RESPIRATORY_TRACT | Status: AC
  Filled 2014-06-24: qty 6

## 2014-06-24 MED ORDER — SODIUM CHLORIDE 0.9 % IV SOLN
0.0000 ug/h | INTRAVENOUS | Status: DC
  Filled 2014-06-24: qty 50

## 2014-06-24 MED ORDER — SODIUM CHLORIDE 0.9 % IV SOLN: 250.0000 mL | INTRAVENOUS | Status: DC | PRN

## 2014-06-24 MED ORDER — ALBUTEROL SULFATE (2.5 MG/3ML) 0.083% IN NEBU: 2.5000 mg | INHALATION_SOLUTION | RESPIRATORY_TRACT | Status: DC | PRN

## 2014-06-24 MED ORDER — SODIUM CHLORIDE 0.9 % IV SOLN
INTRAVENOUS | Status: DC
  Administered 2014-06-25: 02:00:00 via INTRAVENOUS

## 2014-06-24 MED ORDER — IPRATROPIUM-ALBUTEROL 0.5-2.5 (3) MG/3ML IN SOLN
3.0000 mL | Freq: Once | RESPIRATORY_TRACT | Status: AC
  Administered 2014-06-24: 3 mL via RESPIRATORY_TRACT
  Filled 2014-06-24: qty 3

## 2014-06-24 MED ORDER — ASPIRIN 300 MG RE SUPP
300.0000 mg | RECTAL | Status: AC
  Administered 2014-06-24: 300 mg via RECTAL
  Filled 2014-06-24: qty 1

## 2014-06-24 MED ORDER — IPRATROPIUM-ALBUTEROL 0.5-2.5 (3) MG/3ML IN SOLN
3.0000 mL | Freq: Four times a day (QID) | RESPIRATORY_TRACT | Status: DC
  Administered 2014-06-24 – 2014-06-26 (×9): 3 mL via RESPIRATORY_TRACT
  Filled 2014-06-24 (×9): qty 3

## 2014-06-24 MED ORDER — PROPOFOL 10 MG/ML IV EMUL
5.0000 ug/kg/min | INTRAVENOUS | Status: DC
  Administered 2014-06-24: 5 ug/kg/min via INTRAVENOUS
  Administered 2014-06-24: 15 ug/kg/min via INTRAVENOUS
  Administered 2014-06-24: 35 ug/kg/min via INTRAVENOUS
  Administered 2014-06-24 – 2014-06-25 (×2): 30 ug/kg/min via INTRAVENOUS
  Filled 2014-06-24 (×4): qty 100

## 2014-06-24 MED ORDER — CILOSTAZOL 50 MG PO TABS
50.0000 mg | ORAL_TABLET | Freq: Two times a day (BID) | ORAL | Status: DC
  Administered 2014-06-24 – 2014-06-29 (×10): 50 mg via ORAL
  Filled 2014-06-24 (×11): qty 1

## 2014-06-24 MED ORDER — ROCURONIUM BROMIDE 50 MG/5ML IV SOLN
INTRAVENOUS | Status: AC
  Filled 2014-06-24: qty 2

## 2014-06-24 MED ORDER — ASPIRIN 300 MG RE SUPP
300.0000 mg | Freq: Every day | RECTAL | Status: DC
  Filled 2014-06-24 (×3): qty 1

## 2014-06-24 MED ORDER — BIOTENE DRY MOUTH MT LIQD
15.0000 mL | Freq: Four times a day (QID) | OROMUCOSAL | Status: DC
  Administered 2014-06-24 – 2014-06-29 (×15): 15 mL via OROMUCOSAL

## 2014-06-24 MED ORDER — TAMSULOSIN HCL 0.4 MG PO CAPS
0.4000 mg | ORAL_CAPSULE | Freq: Every day | ORAL | Status: DC
  Administered 2014-06-25 – 2014-06-29 (×5): 0.4 mg via ORAL
  Filled 2014-06-24 (×5): qty 1

## 2014-06-24 MED ORDER — ASPIRIN 81 MG PO CHEW
324.0000 mg | CHEWABLE_TABLET | Freq: Every day | ORAL | Status: DC
  Administered 2014-06-25 – 2014-06-27 (×3): 324 mg via ORAL
  Filled 2014-06-24 (×3): qty 4

## 2014-06-24 MED ORDER — NICARDIPINE HCL IN NACL 20-0.86 MG/200ML-% IV SOLN
3.0000 mg/h | INTRAVENOUS | Status: DC
  Filled 2014-06-24: qty 200

## 2014-06-24 MED ORDER — ATORVASTATIN CALCIUM 40 MG PO TABS
40.0000 mg | ORAL_TABLET | Freq: Every day | ORAL | Status: DC
  Administered 2014-06-24 – 2014-06-28 (×5): 40 mg via ORAL
  Filled 2014-06-24 (×6): qty 1

## 2014-06-24 MED ORDER — NITROGLYCERIN IN D5W 200-5 MCG/ML-% IV SOLN
2.0000 ug/min | Freq: Once | INTRAVENOUS | Status: AC
  Administered 2014-06-24: 5 ug/min via INTRAVENOUS
  Filled 2014-06-24: qty 250

## 2014-06-24 MED ORDER — FINASTERIDE 5 MG PO TABS
5.0000 mg | ORAL_TABLET | Freq: Every day | ORAL | Status: DC
  Administered 2014-06-25 – 2014-06-29 (×5): 5 mg via ORAL
  Filled 2014-06-24 (×5): qty 1

## 2014-06-24 NOTE — ED Notes (Signed)
Attempted to place coude catheter twice and no success.  Unable to pass catheter, did not try to force catheter in.  Will notify RN

## 2014-06-24 NOTE — Progress Notes (Signed)
[facility] Progress Note Patient Name: [name] DOB: [birth year] MRN: [id number]  Date of Service  06/24/2014   HPI/Events of Note  Patient with continued increase in trop from 0.32 to 2.52.  ECHO completed earlier shows preserved EF of 50% but with HK of inferior myocardium.  On ASA/BB/CCB   eICU Interventions  Plan: Start heparin gtt Consider CARDs consult   Intervention Category Intermediate Interventions: Arrhythmia - evaluation and management  DETERDING,ELIZABETH 06/24/2014, 11:43 PM

## 2014-06-24 NOTE — Progress Notes (Signed)
RT note: ETT advanced 2cm per CXR; currently 25cm @ Lip

## 2014-06-24 NOTE — Progress Notes (Signed)
  Echocardiogram 2D Echocardiogram has been performed.  [name], [name] 06/24/2014, 4:54 PM

## 2014-06-24 NOTE — H&P (Addendum)
PULMONARY / CRITICAL CARE MEDICINE   Name: [name] MRN: [id number] DOB: 1936/03/10   PCP Beacher May, MD   ADMISSION DATE:  06/24/2014  REFERRING MD :  Dr. Wyvonnia Dusky  PRIMARY SERVICE: PCCM   CHIEF COMPLAINT:  Acute respiratory failure  BRIEF PATIENT DESCRIPTION: 78 y/o M, current smoker, with PMH of HTN, CAD s/p MI, PVD & bipolar disorder who presented to [facility] ER on 6/28 with hypercarbic respiratory failure & hypertensive emergency.   SIGNIFICANT EVENTS / STUDIES:  6/28 - Admit with hypercarbic respiratory failure / hypertensive emergency   LINES / TUBES: OETT 6/28 >>>  CULTURES: Sputum 6/28 >>> Resp virus 6/28 >> Urine strep 6/28 >> Blood culture 6/28 >> Urine Tox 6/28 >>   ANTIBIOTICS:  Doxy 06/24/14 >>   HISTORY OF PRESENT ILLNESS:  78 y/o M, current smoker, per daughter hx of non compliance, daily bourbon intake,with PMH of HTN, CAD s/p MI, PVD & bipolar disorder who presented to [facility] ER on 6/28 from home with complaints of shortness of breath.  Per daughter several months of cough, few days of fatigue and increased edema but refused to see doctor. EMS found patient to be tripoding, unable to speak with bilateral wheezing.  Patient was medicated with solu-medrol 125 mg and breathing treatments x 2.  He had progressive decline with reduced mental status and increased WOB requiring intubation in ER.  PCCM consulted for ICU admit.    No further information available at this time.  No family at bedside.    PAST MEDICAL HISTORY :  Past Medical History  Diagnosis Date  . Hyperlipidemia   . HTN (hypertension)     echo 2/11: EF 50-55%, diast dyfxn, severe LVH, inf HK, LAE  . CAD (coronary artery disease)     a.  NSTEMI treated with PCI Feb 2011 with a DES.(Endeavor);   b. cath 2/11: D1 stents x 2 ok, AV groove CFX occluded with dist AV CFX filled by L-L collats, RCA occluded, OM2 90-95% (treated with PCI)  . MI (myocardial infarction)   . PVD (peripheral vascular  disease)   . DM (diabetes mellitus)   . GERD (gastroesophageal reflux disease)   . Bipolar disorder   . Prostate cancer     status post radiation therapy in 2003   Past Surgical History  Procedure Laterality Date  . Femoral bypass  2003   Prior to Admission medications   Medication Sig Start Date End Date Taking? Authorizing Provider  ammonium lactate (AMLACTIN) 12 % cream as needed. Apply Once a Day    Historical Provider, MD  aspirin 325 MG EC tablet Take 325 mg by mouth daily.      Historical Provider, MD  atorvastatin (LIPITOR) 40 MG tablet Take 40 mg by mouth daily.    Historical Provider, MD  carvedilol (COREG) 12.5 MG tablet Take 12.5 mg by mouth 2 (two) times daily with a meal.  09/02/12   Historical Provider, MD  cilostazol (PLETAL) 50 MG tablet Take 50 mg by mouth 2 (two) times daily.    Historical Provider, MD  Cyanocobalamin (VITAMIN B 12 PO) Take 1 tablet by mouth daily. [place] Provider, MD  DAILY MULTIPLE VITAMINS PO Take 1 tablet by mouth once a day.     Historical Provider, MD  diltiazem (CARDIZEM LA) 120 MG 24 hr tablet Take 120 mg by mouth daily.    Historical Provider, MD  divalproex (DEPAKOTE) 250 MG EC tablet 6 tablets everyday.  Historical Provider, MD  finasteride (PROSCAR) 5 MG tablet Take 5 mg by mouth daily.      Historical Provider, MD  furosemide (LASIX) 40 MG tablet take 1 tablet by mouth once daily 10/03/12   Darlin Coco, MD  gabapentin (NEURONTIN) 300 MG capsule Take 300 mg by mouth 3 (three) times daily.    Historical Provider, MD  insulin glargine (LANTUS) 100 UNIT/ML injection Inject 40 Units into the skin daily.     Historical Provider, MD  insulin regular (HUMULIN R,NOVOLIN R) 100 units/mL injection Inject 8 Units into the skin 2 (two) times daily before a meal.     Historical Provider, MD  lisinopril (PRINIVIL,ZESTRIL) 40 MG tablet 40 mg daily.     Historical Provider, MD  nitroGLYCERIN (NITRODUR - DOSED IN MG/24 HR) 0.4 mg/hr  Apply 1 patch each morning and remove at bedtime.     Historical Provider, MD  spironolactone (ALDACTONE) 25 MG tablet Take 25 mg by mouth daily.    Historical Provider, MD  Tamsulosin HCl (FLOMAX) 0.4 MG CAPS Take 0.4 mg by mouth daily.      Historical Provider, MD  travoprost, benzalkonium, (TRAVATAN) 0.004 % ophthalmic solution Place 1 drop into both eyes at bedtime.      Historical Provider, MD   Allergies  Allergen Reactions  . Penicillins     REACTION: hands and feet edema    FAMILY HISTORY:  Family History  Problem Relation Age of Onset  . Cancer Mother   . Cancer Father   . Cancer Sister    SOCIAL HISTORY:  reports that he has been smoking Cigarettes.  He has a 25 pack-year smoking history. He does not have any smokeless tobacco history on file. He reports that he drinks alcohol. He reports that he does not use illicit drugs.  REVIEW OF SYSTEMS:  Unable to complete as patient is altered on mechanical ventilation.  No family available.   SUBJECTIVE:   VITAL SIGNS: Pulse Rate:  [94-115] 99 (06/28 1245) Resp:  [16-29] 22 (06/28 1245) BP: (164-216)/(67-165) 173/93 mmHg (06/28 1245) SpO2:  [88 %-100 %] 99 % (06/28 1245) FiO2 (%):  [60 %-100 %] 60 % (06/28 1128) Weight:  [220 lb 7.4 oz (100 kg)] 220 lb 7.4 oz (100 kg) (06/28 1211) HEMODYNAMICS:   VENTILATOR SETTINGS: Vent Mode:  [-] PRVC FiO2 (%):  [60 %-100 %] 60 % Set Rate:  [22 bmp] 22 bmp Vt Set:  [600 mL] 600 mL PEEP:  [5 cmH20] 5 cmH20 Plateau Pressure:  [22 cmH20] 22 cmH20 INTAKE / OUTPUT: Intake/Output     06/27 0701 - 06/28 0700 06/28 0701 - 06/29 0700   Urine (mL/kg/hr)  300   Total Output   300   Net   -300          PHYSICAL EXAMINATION: General:  wdwn adult male on vent  Neuro:  Sedate, no distress, intermittent periods of agitation / MAE's HEENT:  OETT, mm pink/moist Cardiovascular:  s1s2 irr irr  Lungs:  resp's even/non-labored, lungs bilaterally diminished but now without wheeze Abdomen:  round/soft, bsx4 active  Musculoskeletal:  No acute deformities  Skin:  Warm/dry, L>R lower ext edema (old harvest graft site noted)  LABS: PULMONARY  Recent Labs Lab 06/24/14 1100 06/24/14 1114  PHART 7.194*  --   PCO2ART 75.8*  --   PO2ART 182.0*  --   HCO3 29.3*  --   TCO2 32 32  O2SAT 99.0  --     CBC  Recent  Labs Lab 06/24/14 1051 06/24/14 1114  HGB 15.6 19.0*  HCT 47.9 56.0*  WBC 9.4  --   PLT 232  --     COAGULATION  Recent Labs Lab 06/24/14 1051  INR 1.08    CARDIAC   Recent Labs Lab 06/24/14 1051  TROPONINI <0.30    Recent Labs Lab 06/24/14 1051  PROBNP 4355.0*     CHEMISTRY  Recent Labs Lab 06/24/14 1051 06/24/14 1114  NA 138 141  K 5.0 4.7  CL 96 97  CO2 25  --   GLUCOSE 264* 268*  BUN 12 13  CREATININE 1.26 1.50*  CALCIUM 9.1  --    Estimated Creatinine Clearance: 52.1 ml/min (by C-G formula based on Cr of 1.5).   LIVER  Recent Labs Lab 06/24/14 1051  AST 46*  ALT 26  ALKPHOS 52  BILITOT 0.6  PROT 8.5*  ALBUMIN 3.5  INR 1.08     INFECTIOUS  Recent Labs Lab 06/24/14 1115  LATICACIDVEN 3.56*     ENDOCRINE CBG (last 3)   Recent Labs  06/24/14 1038  GLUCAP 257*         IMAGING x48h  Dg Chest Portable 1 View  06/24/2014   CLINICAL DATA:  SHORTNESS OF BREATH  EXAM: PORTABLE CHEST - 1 VIEW  COMPARISON:  11/29/2012  FINDINGS: Endotracheal tube has been placed, tip approximately 5.8 cm above carina. Atheromatous aorta. Heart size upper limits normal. Moderate interstitial and airspace opacities in a predominantly perihilar and infrahilar distribution, increased since previous exam, right greater than left. No effusion. Visualized skeletal structures are unremarkable.  IMPRESSION: 1. Endotracheal tube tip 5.8 cm above carina. 2. Interval increase in asymmetric bilateral edema/infiltrates.   Electronically Signed   By: Arne Cleveland M.D.   On: 06/24/2014 11:38      ASSESSMENT /  PLAN:  PULMONARY A: Baseline 50 pack smoker: with COPD NOS, Several months of chronic cough Now, Hypercarbic and Hypoxemic Acute  Acidosis Failure ? CHF NOS/acute pulm edema acute v AECOPD  P:   Full support, 8cc/kg Trend ABG, CXR Daily SBT / WUA Scheduled BD's + PRN albuterol Smoking cessation counseling  Scheduled steroids Needs CT chest for cough and smoknig hx - to be done at some point   CARDIOVASCULAR A:  Atrial Fibrillation  Hypertensive Emergency - initial MAP 114 CAD s/p MI + Stents - c/o [facility] PVD   P:  Reduction of BP by 25% initial MAP, goal MAP 85 Continue home ASA, lipitor, cardizem, pletal Hold home lisinopril  Continue NTG gtt for now, consider different agent if sedation + home meds does not reduce BP Cycle enzymes Get ECHO  RENAL A:   Hx of prostate cancer - 2003 Acute on Chronic Renal Insufficiency : failed Coude and Foley in ER 06/24/2014  P:   BP control as above Trend BMP Might need uology consult  GASTROINTESTINAL A:   Ventilator Associated Dysphagia  P:   NPO OGT Consider feeding in am 6/29 if remains intubated   HEMATOLOGIC A:   No acute issues  P:  DVT: heparin sq Monitor CBC   INFECTIOUS A:   AECOPD P:   5 days doxycycline  Check PCT, cultures, resp virus pcr panel Monitor fever curve / leukocytosis  ENDOCRINE A:   Diabetes Mellitus  P:   Continue home lantus at reduced dose - 20 units (baseline 40 units QD).  May require increase with steroid administration SSI   NEUROLOGIC A:   Daily bourbon intake  per daughter. At risk for etoh withdrawal Acute Encephalopathy - in setting of hypercarbic respiratory failure P:   RASS goal: -1 Sedation protocol - propofol gtt + fentanyl prn   GLOBAL 06/24/14 daughter and son in law y arrived after he arrived in ICU and updated by staff MD   Noe Gens, NP-C [place] Pgr: [phone] or [phone] 06/24/2014, 12:55 PM   STAFF NOTE: I, Dr Ann Lions have personally reviewed patient's available data, including medical history, events of note, physical examination and test results as part of my evaluation. I have discussed with resident/NP and other care providers such as pharmacist, RN and RRT.  In addition,  I personally evaluated patient and elicited key findings of acute resp failure due to AECOPD v Acute Pulm Edema - rx for both. Has worsening AKI; hold lasix. Monitor closely. Rule out rhabdo in the heat and get urine tox adn etoh. Family updated at bedside - daughter and son in law. .  Rest per NP/medical resident whose note is outlined above and that I agree with  The patient is critically ill with multiple organ systems failure and requires high complexity decision making for assessment and support, frequent evaluation and titration of therapies, application of advanced monitoring technologies and extensive interpretation of multiple databases.   Critical Care Time devoted to patient care services described in this note is  35  Minutes.  Dr. Brand Males, M.D., [facility].C.P Pulmonary and Critical Care Medicine Staff Physician [facility] Pager: [phone], If no answer or between  15:00h - 7:00h: call [phone]  06/24/2014 2:47 PM

## 2014-06-24 NOTE — ED Notes (Signed)
Pt from home, c/o sob. Pt received 125 solumederol, 2 breathing tx x 2. Difficult to arouse

## 2014-06-24 NOTE — Progress Notes (Signed)
ANTICOAGULATION CONSULT NOTE - Initial Consult  Pharmacy Consult for Heparin  Indication: chest pain/ACS  Allergies  Allergen Reactions  . Penicillins Other (See Comments)     hands and feet edema    Patient Measurements: Height: 6' (182.9 cm) Weight: 220 lb 7.4 oz (100 kg) IBW/kg (Calculated) : 77.6  Vital Signs: Temp: 97.9 F (36.6 C) (06/28 2002) Temp src: Oral (06/28 2002) BP: 129/72 mmHg (06/28 2338) Pulse Rate: 86 (06/28 2338)  Labs:  Recent Labs  06/24/14 1051 06/24/14 1114 06/24/14 1532 06/24/14 2220  HGB 15.6 19.0* 14.6  --   HCT 47.9 56.0* 43.5  --   PLT 232  --  203  --   LABPROT 14.0  --  14.0  --   INR 1.08  --  1.08  --   CREATININE 1.26 1.50* 1.28  --   CKTOTAL  --   --  150  --   CKMB  --   --  5.3*  --   TROPONINI <0.30  --  0.32* 2.52*    Estimated Creatinine Clearance: 61.1 ml/min (by C-G formula based on Cr of 1.28).  Medical History: Past Medical History  Diagnosis Date  . Hyperlipidemia   . HTN (hypertension)     echo 2/11: EF 50-55%, diast dyfxn, severe LVH, inf HK, LAE  . CAD (coronary artery disease)     a.  NSTEMI treated with PCI Feb 2011 with a DES.([facility]avor);   b. cath 2/11: D1 stents x 2 ok, AV groove CFX occluded with dist AV CFX filled by L-L collats, RCA occluded, OM2 90-95% (treated with PCI)  . MI (myocardial infarction)   . PVD (peripheral vascular disease)   . DM (diabetes mellitus)   . GERD (gastroesophageal reflux disease)   . Bipolar disorder   . Prostate cancer     status post radiation therapy in 2003    Assessment: 78 y/o M to start heparin for elevated troponin. CBC/renal function ok. Other labs as above.   Goal of Therapy:  Heparin level 0.3-0.7 units/ml Monitor platelets by anticoagulation protocol: Yes   Plan:  -Heparin 4000 units BOLUS  -Start heparin drip at 1100 units/hr -0800 HL -Daily CBC/HL -Monitor for bleeding -F/U MD plans  Narda Bonds 06/24/2014,11:46 PM

## 2014-06-24 NOTE — Progress Notes (Signed)
**Note De-Identified  Obfuscation** RT note: patient tolerated transported to 2M04.

## 2014-06-24 NOTE — ED Notes (Signed)
condom cath applied to penis by er tech

## 2014-06-24 NOTE — ED Notes (Signed)
NOTIFIED K.REID ,RN FOR DR. RANCOUR OF PATIENTS PANIC LAB RESULTS OF CG4+ LACTIC ACVID = 3.56 mmol/L ,AND I-STAT CHEM8+ ,@11 :24 AM .06/24/2014.

## 2014-06-24 NOTE — ED Provider Notes (Signed)
CSN: [id number]     Arrival date & time 06/24/14  1029 History   None    Chief Complaint  Patient presents with  . Shortness of Breath     (Consider location/radiation/quality/duration/timing/severity/associated sxs/prior Treatment) HPI Comments: Patient presents by EMS with severe shortness of breath. Level V caveat for respiratory distress. EMS found him tripoding with wheezing and decreased breath sounds. He received solumedrol and 2 nebulizers. He arrives obtunded, unable to speak, not following commands. Breath sounds are diminished. No DNR present. No family present.  He is intubated on arrival.  The history is provided by the patient and the EMS personnel. The history is limited by the condition of the patient.    Past Medical History  Diagnosis Date  . Hyperlipidemia   . HTN (hypertension)     echo 2/11: EF 50-55%, diast dyfxn, severe LVH, inf HK, LAE  . CAD (coronary artery disease)     a.  NSTEMI treated with PCI Feb 2011 with a DES.([facility]);   b. cath 2/11: D1 stents x 2 ok, AV groove CFX occluded with dist AV CFX filled by L-L collats, RCA occluded, OM2 90-95% (treated with PCI)  . MI (myocardial infarction)   . PVD (peripheral vascular disease)   . DM (diabetes mellitus)   . GERD (gastroesophageal reflux disease)   . Bipolar disorder   . Prostate cancer     status post radiation therapy in 2003   Past Surgical History  Procedure Laterality Date  . Femoral bypass  2003   Family History  Problem Relation Age of Onset  . Cancer Mother   . Cancer Father   . Cancer Sister    History  Substance Use Topics  . Smoking status: Current Every Day Smoker -- 0.50 packs/day for 50 years    Types: Cigarettes  . Smokeless tobacco: Not on file     Comment: He has a 50-pack-year hx of tobacco abuse. Currently, smoking about half a pack a day.  . Alcohol Use: Yes     Comment: Previously drank heavily, and now has a drink every other day or so.    Review of Systems   Unable to perform ROS: Acuity of condition      Allergies  Penicillins  Home Medications   Prior to Admission medications   Medication Sig Start Date End Date Taking? Authorizing Provider  ammonium lactate (AMLACTIN) 12 % cream as needed. Apply Once a Day    Historical Provider, MD  aspirin 325 MG EC tablet Take 325 mg by mouth daily.      Historical Provider, MD  atorvastatin (LIPITOR) 40 MG tablet Take 40 mg by mouth daily.    Historical Provider, MD  carvedilol (COREG) 12.5 MG tablet Take 12.5 mg by mouth 2 (two) times daily with a meal.  09/02/12   Historical Provider, MD  cilostazol (PLETAL) 50 MG tablet Take 50 mg by mouth 2 (two) times daily.    Historical Provider, MD  Cyanocobalamin (VITAMIN B 12 PO) Take 1 tablet by mouth daily. Celeste Provider, MD  DAILY MULTIPLE VITAMINS PO Take 1 tablet by mouth once a day.     Historical Provider, MD  diltiazem (CARDIZEM LA) 120 MG 24 hr tablet Take 120 mg by mouth daily.    Historical Provider, MD  divalproex (DEPAKOTE) 250 MG EC tablet 6 tablets everyday.     Historical Provider, MD  finasteride (PROSCAR) 5 MG tablet Take 5 mg by mouth daily.  Historical Provider, MD  furosemide (LASIX) 40 MG tablet take 1 tablet by mouth once daily 10/03/12   Darlin Coco, MD  gabapentin (NEURONTIN) 300 MG capsule Take 300 mg by mouth 3 (three) times daily.    Historical Provider, MD  insulin glargine (LANTUS) 100 UNIT/ML injection Inject 40 Units into the skin daily.     Historical Provider, MD  insulin regular (HUMULIN R,NOVOLIN R) 100 units/mL injection Inject 8 Units into the skin 2 (two) times daily before a meal.     Historical Provider, MD  lisinopril (PRINIVIL,ZESTRIL) 40 MG tablet 40 mg daily.     Historical Provider, MD  nitroGLYCERIN (NITRODUR - DOSED IN MG/24 HR) 0.4 mg/hr Apply 1 patch each morning and remove at bedtime.     Historical Provider, MD  spironolactone (ALDACTONE) 25 MG tablet Take 25 mg by mouth daily.     Historical Provider, MD  Tamsulosin HCl (FLOMAX) 0.4 MG CAPS Take 0.4 mg by mouth daily.      Historical Provider, MD  travoprost, benzalkonium, (TRAVATAN) 0.004 % ophthalmic solution Place 1 drop into both eyes at bedtime.      Historical Provider, MD   BP 216/165  Pulse 94  Resp 22  Ht 6' (1.829 m)  Wt 220 lb 7.4 oz (100 kg)  BMI 29.89 kg/m2  SpO2 95% Physical Exam  Nursing note and vitals reviewed. Constitutional: He appears distressed.  Severe respiratory distress, diaphoretic unable to speak  HENT:  Head: Normocephalic and atraumatic.  Mouth/Throat: Oropharynx is clear and moist. No oropharyngeal exudate.  Eyes: Conjunctivae and EOM are normal. Pupils are equal, round, and reactive to light.  Neck: Normal range of motion. Neck supple.  No meningismus.  Cardiovascular: Normal rate, regular rhythm, normal heart sounds and intact distal pulses.   No murmur heard. Pulmonary/Chest: He is in respiratory distress. He has rales.  Severe distress with basilar rales and wheezing throughout  Abdominal: Soft. There is no tenderness. There is no rebound and no guarding.  Musculoskeletal: Normal range of motion. He exhibits no edema and no tenderness.  Neurological: No cranial nerve deficit. He exhibits normal muscle tone. Coordination normal.  Unable to follow commands, obtunded  Skin: Skin is warm. He is diaphoretic.  Psychiatric: He has a normal mood and affect. His behavior is normal.    ED Course  INTUBATION Date/Time: 06/24/2014 12:44 PM Performed by: Ezequiel Essex Authorized by: Ezequiel Essex Consent: Verbal consent obtained. The procedure was performed in an emergent situation. Risks and benefits: risks, benefits and alternatives were discussed Consent given by: patient Patient understanding: patient states understanding of the procedure being performed Patient identity confirmed: provided demographic data and arm band Time out: Immediately prior to procedure a "time  out" was called to verify the correct patient, procedure, equipment, support staff and site/side marked as required. Indications: respiratory distress and  respiratory failure Intubation method: video-assisted Patient status: paralyzed (RSI) Preoxygenation: nonrebreather mask and BVM Sedatives: etomidate Paralytic: rocuronium Laryngoscope size: Mac 4 Tube size: 7.5 mm Tube type: cuffed Number of attempts: 2 Ventilation between attempts: BVM Cricoid pressure: no Cords visualized: yes Post-procedure assessment: chest rise and ETCO2 monitor Breath sounds: equal Cuff inflated: yes ETT to lip: 24 cm Tube secured with: ETT holder Chest x-ray interpreted by me and radiologist. Chest x-ray findings: endotracheal tube in appropriate position Patient tolerance: Patient tolerated the procedure well with no immediate complications.   (including critical care time) Labs Review Labs Reviewed  COMPREHENSIVE METABOLIC PANEL - Abnormal; Notable for the  following:    Glucose, Bld 264 (*)    Total Protein 8.5 (*)    AST 46 (*)    GFR calc non Af Amer 54 (*)    GFR calc Af Amer 63 (*)    All other components within normal limits  PRO B NATRIURETIC PEPTIDE - Abnormal; Notable for the following:    Pro B Natriuretic peptide (BNP) 4355.0 (*)    All other components within normal limits  CBG MONITORING, ED - Abnormal; Notable for the following:    Glucose-Capillary 257 (*)    All other components within normal limits  I-STAT ARTERIAL BLOOD GAS, ED - Abnormal; Notable for the following:    pH, Arterial 7.194 (*)    pCO2 arterial 75.8 (*)    pO2, Arterial 182.0 (*)    Bicarbonate 29.3 (*)    All other components within normal limits  I-STAT CG4 LACTIC ACID, ED - Abnormal; Notable for the following:    Lactic Acid, Venous 3.56 (*)    All other components within normal limits  I-STAT CHEM 8, ED - Abnormal; Notable for the following:    Creatinine, Ser 1.50 (*)    Glucose, Bld 268 (*)     Hemoglobin 19.0 (*)    HCT 56.0 (*)    All other components within normal limits  CBC WITH DIFFERENTIAL  TROPONIN I  PROTIME-INR  URINALYSIS, ROUTINE W REFLEX MICROSCOPIC    Imaging Review Dg Chest Portable 1 View  06/24/2014   CLINICAL DATA:  SHORTNESS OF BREATH  EXAM: PORTABLE CHEST - 1 VIEW  COMPARISON:  11/29/2012  FINDINGS: Endotracheal tube has been placed, tip approximately 5.8 cm above carina. Atheromatous aorta. Heart size upper limits normal. Moderate interstitial and airspace opacities in a predominantly perihilar and infrahilar distribution, increased since previous exam, right greater than left. No effusion. Visualized skeletal structures are unremarkable.  IMPRESSION: 1. Endotracheal tube tip 5.8 cm above carina. 2. Interval increase in asymmetric bilateral edema/infiltrates.   Electronically Signed   By: Arne Cleveland M.D.   On: 06/24/2014 11:38     EKG Interpretation   Date/Time:  Sunday June 24 2014 10:34:39 EDT Ventricular Rate:  111 PR Interval:    QRS Duration: 156 QT Interval:  370 QTC Calculation: 503 R Axis:   161 Text Interpretation:  Atrial fibrillation Right bundle branch block  Probable anteroseptal infarct, old now atrial fibrillation with anterior  ST depressions Confirmed by Wyvonnia Dusky  MD, Rasa Degrazia [phone]) on 06/24/2014  10:54:48 AM      MDM   Final diagnoses:  Acute respiratory failure with hypoxia  Acute systolic congestive heart failure   patient with severe respiratory distress on arrival. Unable to give a history. Poor air exchange throughout with hypoxia on nonrebreather. Hypertensive. Preparation made for intubation. Patient placed on BiPAP for preoxygenation after etomidate given for anxiolysis and sedation. O2 saturations increased to 96%.  intubation performed as above.  atrial fibrillation EKG without ST changes. Chest x-ray shows bilateral edema,. Patient given nitroglycerin, aspirin, Lasix, and nebulizers. He is a smoker but no  documented history of COPD.  ABG with CO2 retention, pH 7.1  Vent RR increased from 18 to 22.  BNP 4000. CXR with interstitial edema. Nitro gtt started for pulmonary edema and BP control. D/w Dr. Chase Caller and NP Alfredo Martinez.   CRITICAL CARE Performed by: Ezequiel Essex Total critical care time: 45 Critical care time was exclusive of separately billable procedures and treating other patients. Critical care was necessary to treat or  prevent imminent or life-threatening deterioration. Critical care was time spent personally by me on the following activities: development of treatment plan with patient and/or surrogate as well as nursing, discussions with consultants, evaluation of patient's response to treatment, examination of patient, obtaining history from patient or surrogate, ordering and performing treatments and interventions, ordering and review of laboratory studies, ordering and review of radiographic studies, pulse oximetry and re-evaluation of patient's condition.   Ezequiel Essex, MD 06/24/14 [phone]

## 2014-06-24 NOTE — ED Notes (Signed)
Unable to catheterize pt

## 2014-06-25 ENCOUNTER — Inpatient Hospital Stay (HOSPITAL_COMMUNITY): Payer: PRIVATE HEALTH INSURANCE

## 2014-06-25 DIAGNOSIS — I5021 Acute systolic (congestive) heart failure: Secondary | ICD-10-CM

## 2014-06-25 DIAGNOSIS — J441 Chronic obstructive pulmonary disease with (acute) exacerbation: Secondary | ICD-10-CM | POA: Diagnosis not present

## 2014-06-25 DIAGNOSIS — R0602 Shortness of breath: Secondary | ICD-10-CM | POA: Diagnosis not present

## 2014-06-25 DIAGNOSIS — I219 Acute myocardial infarction, unspecified: Secondary | ICD-10-CM

## 2014-06-25 DIAGNOSIS — I739 Peripheral vascular disease, unspecified: Secondary | ICD-10-CM

## 2014-06-25 DIAGNOSIS — I509 Heart failure, unspecified: Secondary | ICD-10-CM

## 2014-06-25 DIAGNOSIS — I214 Non-ST elevation (NSTEMI) myocardial infarction: Secondary | ICD-10-CM

## 2014-06-25 LAB — GLUCOSE, CAPILLARY
GLUCOSE-CAPILLARY: 171 mg/dL — AB (ref 70–99)
GLUCOSE-CAPILLARY: 221 mg/dL — AB (ref 70–99)
GLUCOSE-CAPILLARY: 231 mg/dL — AB (ref 70–99)
Glucose-Capillary: 189 mg/dL — ABNORMAL HIGH (ref 70–99)
Glucose-Capillary: 224 mg/dL — ABNORMAL HIGH (ref 70–99)
Glucose-Capillary: 368 mg/dL — ABNORMAL HIGH (ref 70–99)

## 2014-06-25 LAB — BLOOD GAS, ARTERIAL
ACID-BASE EXCESS: 4.3 mmol/L — AB (ref 0.0–2.0)
Bicarbonate: 28.9 mEq/L — ABNORMAL HIGH (ref 20.0–24.0)
Drawn by: 36277
FIO2: 0.5 %
MECHVT: 600 mL
O2 SAT: 94.6 %
PCO2 ART: 48 mmHg — AB (ref 35.0–45.0)
PEEP: 5 cmH2O
PO2 ART: 71.7 mmHg — AB (ref 80.0–100.0)
Patient temperature: 98.6
RATE: 14 resp/min
TCO2: 30.3 mmol/L (ref 0–100)
pH, Arterial: 7.396 (ref 7.350–7.450)

## 2014-06-25 LAB — BASIC METABOLIC PANEL
BUN: 20 mg/dL (ref 6–23)
CALCIUM: 8.8 mg/dL (ref 8.4–10.5)
CO2: 28 meq/L (ref 19–32)
CREATININE: 1.77 mg/dL — AB (ref 0.50–1.35)
Chloride: 98 mEq/L (ref 96–112)
GFR calc non Af Amer: 36 mL/min — ABNORMAL LOW (ref >90)
GFR, EST AFRICAN AMERICAN: 42 mL/min — AB (ref >90)
Glucose, Bld: 190 mg/dL — ABNORMAL HIGH (ref 70–99)
Potassium: 4.6 mEq/L (ref 3.7–5.3)
SODIUM: 138 meq/L (ref 137–147)

## 2014-06-25 LAB — POCT I-STAT 3, ART BLOOD GAS (G3+)
ACID-BASE EXCESS: 3 mmol/L — AB (ref 0.0–2.0)
BICARBONATE: 28.9 meq/L — AB (ref 20.0–24.0)
O2 SAT: 94 %
TCO2: 30 mmol/L (ref 0–100)
pCO2 arterial: 44.7 mmHg (ref 35.0–45.0)
pH, Arterial: 7.416 (ref 7.350–7.450)
pO2, Arterial: 68 mmHg — ABNORMAL LOW (ref 80.0–100.0)

## 2014-06-25 LAB — PROCALCITONIN: Procalcitonin: 0.65 ng/mL

## 2014-06-25 LAB — CBC
HCT: 39.6 % (ref 39.0–52.0)
Hemoglobin: 13 g/dL (ref 13.0–17.0)
MCH: 29.5 pg (ref 26.0–34.0)
MCHC: 32.8 g/dL (ref 30.0–36.0)
MCV: 89.8 fL (ref 78.0–100.0)
Platelets: 200 10*3/uL (ref 150–400)
RBC: 4.41 MIL/uL (ref 4.22–5.81)
RDW: 15 % (ref 11.5–15.5)
WBC: 7.9 10*3/uL (ref 4.0–10.5)

## 2014-06-25 LAB — HEPARIN LEVEL (UNFRACTIONATED): Heparin Unfractionated: 0.95 IU/mL — ABNORMAL HIGH (ref 0.30–0.70)

## 2014-06-25 LAB — TROPONIN I: Troponin I: 1.23 ng/mL (ref <0.30)

## 2014-06-25 LAB — PHOSPHORUS: Phosphorus: 3 mg/dL (ref 2.3–4.6)

## 2014-06-25 LAB — MAGNESIUM: Magnesium: 1.3 mg/dL — ABNORMAL LOW (ref 1.5–2.5)

## 2014-06-25 MED ORDER — SODIUM CHLORIDE 0.9 % IV SOLN: 250.0000 mL | INTRAVENOUS | Status: DC | PRN

## 2014-06-25 MED ORDER — VITAL HIGH PROTEIN PO LIQD
1000.0000 mL | ORAL | Status: DC
  Filled 2014-06-25 (×2): qty 1000

## 2014-06-25 MED ORDER — DILTIAZEM HCL 30 MG PO TABS
30.0000 mg | ORAL_TABLET | Freq: Four times a day (QID) | ORAL | Status: DC
  Administered 2014-06-25 – 2014-06-27 (×9): 30 mg via ORAL
  Filled 2014-06-25 (×12): qty 1

## 2014-06-25 MED ORDER — THIAMINE HCL 100 MG/ML IJ SOLN
100.0000 mg | Freq: Every day | INTRAMUSCULAR | Status: DC
  Administered 2014-06-25 – 2014-06-26 (×2): 100 mg via INTRAVENOUS
  Filled 2014-06-25 (×3): qty 1

## 2014-06-25 MED ORDER — VITAL AF 1.2 CAL PO LIQD
1000.0000 mL | ORAL | Status: DC
  Filled 2014-06-25 (×2): qty 1000

## 2014-06-25 MED ORDER — PRO-STAT SUGAR FREE PO LIQD
30.0000 mL | Freq: Two times a day (BID) | ORAL | Status: DC
  Administered 2014-06-25: 30 mL
  Filled 2014-06-25 (×2): qty 30

## 2014-06-25 MED ORDER — METHYLPREDNISOLONE SODIUM SUCC 40 MG IJ SOLR
40.0000 mg | Freq: Two times a day (BID) | INTRAMUSCULAR | Status: DC
  Administered 2014-06-25 – 2014-06-26 (×3): 40 mg via INTRAVENOUS
  Filled 2014-06-25 (×4): qty 1

## 2014-06-25 MED ORDER — INSULIN GLARGINE 100 UNIT/ML ~~LOC~~ SOLN
20.0000 [IU] | Freq: Every day | SUBCUTANEOUS | Status: DC
  Administered 2014-06-25: 20 [IU] via SUBCUTANEOUS
  Filled 2014-06-25 (×2): qty 0.2

## 2014-06-25 NOTE — Procedures (Signed)
Central Venous Catheter Insertion Procedure Note ADAL SERENO [id number] [birth year]  Procedure: Insertion of Central Venous Catheter Indications: Assessment of intravascular volume, Drug and/or fluid administration and Frequent blood sampling  Procedure Details Consent: Risks of procedure as well as the alternatives and risks of each were explained to the (patient/caregiver).  Consent for procedure obtained. Time Out: Verified patient identification, verified procedure, site/side was marked, verified correct patient position, special equipment/implants available, medications/allergies/relevent history reviewed, required imaging and test results available.  Performed  Maximum sterile technique was used including antiseptics, cap, gloves, gown, hand hygiene, mask and sheet. Skin prep: Chlorhexidine; local anesthetic administered A antimicrobial bonded/coated triple lumen catheter was placed in the right internal jugular vein using the Seldinger technique.  Evaluation Blood flow good Complications: No apparent complications Patient did tolerate procedure well. Chest X-ray ordered to verify placement.  CXR: pending.  Procedure performed under direct ultrasound guidance for real time vessel cannulation.      Montey Hora, [facility] Pgr: [phone]  or [phone]   Need acces cvp in setting arf  Lavon Paganini. Titus Mould, MD, [place] Pgr: [facility]

## 2014-06-25 NOTE — Consult Note (Addendum)
Cardiology Consultation  [name]    [id number] 1936/03/08  Reason for Consult: CAD/respiratory failure/pulmonary edema/NSTEMI  Requesting Physician: Titus Mould  Primary Cardiologist: Burt Knack  HPI: [name] is a 77 year old, African American male with a long-standing history of tobacco abuse, who has a history of hypertension, remote myocardial infarction with CAD, extensive peripheral vascular disease, and bipolar disorder.  He presented to [facility] emergency room yesterday with hypercarbic respiratory failure and hypertensive emergency.  A chest x-ray has demonstrated findings suggestive of very edema.  He also was found to be in atrial fibrillation and was started on heparin anticoagulation.  An echo Doppler study revealed an ejection fraction of 50-55% with hypokinesis of the inferior wall and grade 3 diastolic dysfunction compatible with restrictive physiology. He is followed by Dr. Burt Knack in the office.  He underwent PCI in 2000 when he presented with a non-ST segment elevation myocardial infarction.  He is status post left fem-pop bypass remotely.  Vascular study showed ABI of 0.39 on the right and 0.75 on the left with segmental pressures, suggesting significant iliofemoral disease, as well as infrapopliteal and multisegmental disease on the right.  He has had ongoing tobacco use.  Her BMP was elevated at 5004 and 46 a troponin initially of 0.3 to increase to 2.04-2199 yesterday and today is 1.23.  The patient remains intubated.  History is unobtainable from the patient. The patient also has a history of daily bourbon use.  There is a remote history of prostate cancer, as well as renal insufficiency   Past Medical History  Diagnosis Date  . Hyperlipidemia   . HTN (hypertension)     echo 2/11: EF 50-55%, diast dyfxn, severe LVH, inf HK, LAE  . CAD (coronary artery disease)     a.  NSTEMI treated with PCI Feb 2011 with a DES.([facility]);   b. cath 2/11: D1 stents x 2  ok, AV groove CFX occluded with dist AV CFX filled by L-L collats, RCA occluded, OM2 90-95% (treated with PCI)  . MI (myocardial infarction)   . PVD (peripheral vascular disease)   . DM (diabetes mellitus)   . GERD (gastroesophageal reflux disease)   . Bipolar disorder   . Prostate cancer     status post radiation therapy in 2003   Past Surgical History  Procedure Laterality Date  . Femoral bypass  2003    FAMHx: Family History  Problem Relation Age of Onset  . Cancer Mother   . Cancer Father   . Cancer Sister     SOCHx:  reports that he has been smoking Cigarettes.  He has a 25 pack-year smoking history. He does not have any smokeless tobacco history on file. He reports that he drinks alcohol. He reports that he does not use illicit drugs.  ALLERGIES: Allergies  Allergen Reactions  . Penicillins Other (See Comments)     hands and feet edema    ROS: Unable to ask the patient since he is on a ventilator and intubated.  By chart review.  There is a history of shortness of breath.  He does have claudication.  There is no recent chest pain.  There is GERD.  He is unaware of blood in stool.  There is leg swelling.  There is diabetes mellitus.  History of hyperlipidemia.  History of claudication.  Negative tremors.  Other review of system negative.    HOME MEDICATIONS: Prescriptions prior to admission  Medication Sig Dispense Refill  . carvedilol (COREG) 12.5 MG tablet Take  12.5 mg by mouth 2 (two) times daily with a meal.       . furosemide (LASIX) 40 MG tablet Take 40 mg by mouth daily.      Marland Kitchen ammonium lactate (AMLACTIN) 12 % cream as needed. Apply Once a Day      . aspirin 325 MG EC tablet Take 325 mg by mouth daily.        Marland Kitchen atorvastatin (LIPITOR) 40 MG tablet Take 40 mg by mouth daily.      . cilostazol (PLETAL) 50 MG tablet Take 50 mg by mouth 2 (two) times daily.      . Cyanocobalamin (VITAMIN B 12 PO) Take 1 tablet by mouth daily. 1000 MCG       . DAILY MULTIPLE  VITAMINS PO Take 1 tablet by mouth once a day.       . diltiazem (CARDIZEM LA) 120 MG 24 hr tablet Take 120 mg by mouth daily.      . divalproex (DEPAKOTE) 250 MG EC tablet 6 tablets everyday.       . finasteride (PROSCAR) 5 MG tablet Take 5 mg by mouth daily.        Marland Kitchen gabapentin (NEURONTIN) 300 MG capsule Take 300 mg by mouth 3 (three) times daily.      . insulin glargine (LANTUS) 100 UNIT/ML injection Inject 40 Units into the skin daily.       . insulin regular (HUMULIN R,NOVOLIN R) 100 units/mL injection Inject 8 Units into the skin 2 (two) times daily before a meal.       . lisinopril (PRINIVIL,ZESTRIL) 40 MG tablet 40 mg daily.       . nitroGLYCERIN (NITRODUR - DOSED IN MG/24 HR) 0.4 mg/hr Apply 1 patch each morning and remove at bedtime.       Marland Kitchen spironolactone (ALDACTONE) 25 MG tablet Take 25 mg by mouth daily.      . Tamsulosin HCl (FLOMAX) 0.4 MG CAPS Take 0.4 mg by mouth daily.        . travoprost, benzalkonium, (TRAVATAN) 0.004 % ophthalmic solution Place 1 drop into both eyes at bedtime.         Scheduled Hospital Medications:  . antiseptic oral rinse  15 mL Mouth Rinse QID  . aspirin  324 mg Oral Daily   Or  . aspirin  300 mg Rectal Daily  . atorvastatin  40 mg Oral q1800  . carvedilol  12.5 mg Oral BID WC  . chlorhexidine  15 mL Mouth Rinse BID  . cilostazol  50 mg Oral BID  . diltiazem  30 mg Oral QID  . doxycycline (VIBRAMYCIN) IV  100 mg Intravenous Q12H  . feeding supplement (PRO-STAT SUGAR FREE 64)  30 mL Per Tube BID  . feeding supplement (VITAL HIGH PROTEIN)  1,000 mL Per Tube Q24H  . fentaNYL  50 mcg Intravenous Once  . finasteride  5 mg Oral Daily  . gabapentin  300 mg Oral TID  . insulin aspart  0-20 Units Subcutaneous 6 times per day  . insulin glargine  20 Units Subcutaneous QHS  . ipratropium-albuterol  3 mL Nebulization Q6H  . latanoprost  1 drop Both Eyes QHS  . methylPREDNISolone (SOLU-MEDROL) injection  40 mg Intravenous Q12H  . pantoprazole (PROTONIX)  IV  40 mg Intravenous QHS  . tamsulosin  0.4 mg Oral Daily  . thiamine IV  100 mg Intravenous Daily    VITALS: Blood pressure 145/82, pulse 99, temperature 97.6 F (36.4 C), temperature source  Oral, resp. rate 22, height 6' (1.829 m), weight 223 lb 5.2 oz (101.3 kg), SpO2 98.00%.  PHYSICAL EXAM: General appearance: Intubated on the ventilator.  Opens eyes. Neck: no carotid bruit, no JVD, supple, symmetrical, trachea midline, thyroid not enlarged, symmetric, no tenderness/mass/nodules and Neck Lungs: Decreased breath sounds; no wheezing Heart: irregularly irregular rhythm Abdomen: soft, non-tender; bowel sounds normal; no masses,  no organomegaly Extremities: Trace edema on the right, 1+ on the left Pulses: 2+ and symmetric Diminished pulses distally. Neurologic: No gross deficits  Telemetry: Atrial fibrillation at 95 beats per minute  ECG (independently read by me): Atrial fibrillation at 95 beats per minute.  Right bundle branch block with repolarization changes.  LABS: Results for orders placed during the hospital encounter of 06/24/14 (from the past 48 hour(s))  CBG MONITORING, ED     Status: Abnormal   Collection Time    06/24/14 10:38 AM      Result Value Ref Range   Glucose-Capillary 257 (*) 70 - 99 mg/dL  CBC WITH DIFFERENTIAL     Status: None   Collection Time    06/24/14 10:51 AM      Result Value Ref Range   WBC 9.4  4.0 - 10.5 K/uL   RBC 5.15  4.22 - 5.81 MIL/uL   Hemoglobin 15.6  13.0 - 17.0 g/dL   HCT 47.9  39.0 - 52.0 %   MCV 93.0  78.0 - 100.0 fL   MCH 30.3  26.0 - 34.0 pg   MCHC 32.6  30.0 - 36.0 g/dL   RDW 15.1  11.5 - 15.5 %   Platelets 232  150 - 400 K/uL   Neutrophils Relative % 46  43 - 77 %   Neutro Abs 4.4  1.7 - 7.7 K/uL   Lymphocytes Relative 41  12 - 46 %   Lymphs Abs 3.8  0.7 - 4.0 K/uL   Monocytes Relative 9  3 - 12 %   Monocytes Absolute 0.9  0.1 - 1.0 K/uL   Eosinophils Relative 3  0 - 5 %   Eosinophils Absolute 0.2  0.0 - 0.7 K/uL    Basophils Relative 1  0 - 1 %   Basophils Absolute 0.1  0.0 - 0.1 K/uL  COMPREHENSIVE METABOLIC PANEL     Status: Abnormal   Collection Time    06/24/14 10:51 AM      Result Value Ref Range   Sodium 138  137 - 147 mEq/L   Potassium 5.0  3.7 - 5.3 mEq/L   Chloride 96  96 - 112 mEq/L   CO2 25  19 - 32 mEq/L   Glucose, Bld 264 (*) 70 - 99 mg/dL   BUN 12  6 - 23 mg/dL   Creatinine, Ser 1.26  0.50 - 1.35 mg/dL   Calcium 9.1  8.4 - 10.5 mg/dL   Total Protein 8.5 (*) 6.0 - 8.3 g/dL   Albumin 3.5  3.5 - 5.2 g/dL   AST 46 (*) 0 - 37 U/L   Comment: HEMOLYSIS AT THIS LEVEL MAY AFFECT RESULT   ALT 26  0 - 53 U/L   Alkaline Phosphatase 52  39 - 117 U/L   Total Bilirubin 0.6  0.3 - 1.2 mg/dL   GFR calc non Af Amer 54 (*) >90 mL/min   GFR calc Af Amer 63 (*) >90 mL/min   Comment: (NOTE)     The eGFR has been calculated using the CKD EPI equation.     This  calculation has not been validated in all clinical situations.     eGFR's persistently <90 mL/min signify possible Chronic Kidney     Disease.  TROPONIN I     Status: None   Collection Time    06/24/14 10:51 AM      Result Value Ref Range   Troponin I <0.30  <0.30 ng/mL   Comment:            Due to the release kinetics of cTnI,     a negative result within the first hours     of the onset of symptoms does not rule out     myocardial infarction with certainty.     If myocardial infarction is still suspected,     repeat the test at appropriate intervals.  PRO B NATRIURETIC PEPTIDE     Status: Abnormal   Collection Time    06/24/14 10:51 AM      Result Value Ref Range   Pro B Natriuretic peptide (BNP) 4355.0 (*) 0 - 450 pg/mL  PROTIME-INR     Status: None   Collection Time    06/24/14 10:51 AM      Result Value Ref Range   Prothrombin Time 14.0  11.6 - 15.2 seconds   INR 1.08  0.00 - 1.49  I-STAT ARTERIAL BLOOD GAS, ED     Status: Abnormal   Collection Time    06/24/14 11:00 AM      Result Value Ref Range   pH, Arterial 7.194  (*) 7.350 - 7.450   pCO2 arterial 75.8 (*) 35.0 - 45.0 mmHg   pO2, Arterial 182.0 (*) 80.0 - 100.0 mmHg   Bicarbonate 29.3 (*) 20.0 - 24.0 mEq/L   TCO2 32  0 - 100 mmol/L   O2 Saturation 99.0     Acid-base deficit 2.0  0.0 - 2.0 mmol/L   Patient temperature 98.0 F     Collection site RADIAL, ALLEN'S TEST ACCEPTABLE     Drawn by Operator     Sample type ARTERIAL     Comment NOTIFIED PHYSICIAN    I-STAT CHEM 8, ED     Status: Abnormal   Collection Time    06/24/14 11:14 AM      Result Value Ref Range   Sodium 141  137 - 147 mEq/L   Potassium 4.7  3.7 - 5.3 mEq/L   Chloride 97  96 - 112 mEq/L   BUN 13  6 - 23 mg/dL   Creatinine, Ser 1.50 (*) 0.50 - 1.35 mg/dL   Glucose, Bld 268 (*) 70 - 99 mg/dL   Calcium, Ion 1.22  1.13 - 1.30 mmol/L   TCO2 32  0 - 100 mmol/L   Hemoglobin 19.0 (*) 13.0 - 17.0 g/dL   HCT 56.0 (*) 39.0 - 52.0 %  I-STAT CG4 LACTIC ACID, ED     Status: Abnormal   Collection Time    06/24/14 11:15 AM      Result Value Ref Range   Lactic Acid, Venous 3.56 (*) 0.5 - 2.2 mmol/L  URINALYSIS, ROUTINE W REFLEX MICROSCOPIC     Status: Abnormal   Collection Time    06/24/14 12:36 PM      Result Value Ref Range   Color, Urine YELLOW  YELLOW   APPearance CLEAR  CLEAR   Specific Gravity, Urine 1.013  1.005 - 1.030   pH 5.5  5.0 - 8.0   Glucose, UA NEGATIVE  NEGATIVE mg/dL   Hgb urine dipstick MODERATE (*)  NEGATIVE   Bilirubin Urine NEGATIVE  NEGATIVE   Ketones, ur NEGATIVE  NEGATIVE mg/dL   Protein, ur 100 (*) NEGATIVE mg/dL   Urobilinogen, UA 1.0  0.0 - 1.0 mg/dL   Nitrite NEGATIVE  NEGATIVE   Leukocytes, UA NEGATIVE  NEGATIVE  URINE MICROSCOPIC-ADD ON     Status: None   Collection Time    06/24/14 12:36 PM      Result Value Ref Range   RBC / HPF 3-6  <3 RBC/hpf   Urine-Other AMORPHOUS URATES/PHOSPHATES    GLUCOSE, CAPILLARY     Status: Abnormal   Collection Time    06/24/14  2:16 PM      Result Value Ref Range   Glucose-Capillary 243 (*) 70 - 99 mg/dL  MRSA  PCR SCREENING     Status: None   Collection Time    06/24/14  2:18 PM      Result Value Ref Range   MRSA by PCR NEGATIVE  NEGATIVE   Comment:            The GeneXpert MRSA Assay (FDA     approved for NASAL specimens     only), is one component of a     comprehensive MRSA colonization     surveillance program. It is not     intended to diagnose MRSA     infection nor to guide or     monitor treatment for     MRSA infections.  CULTURE, BLOOD (ROUTINE X 2)     Status: None   Collection Time    06/24/14  3:17 PM      Result Value Ref Range   Specimen Description BLOOD LEFT ARM     Special Requests BOTTLES DRAWN AEROBIC AND ANAEROBIC 10CC     Culture  Setup Time       Value: 06/24/2014 22:50     Performed at [facility]   Culture       Value:        BLOOD CULTURE RECEIVED NO GROWTH TO DATE CULTURE WILL BE HELD FOR 5 DAYS BEFORE ISSUING A FINAL NEGATIVE REPORT     Performed at [facility]   Report Status PENDING    TROPONIN I     Status: Abnormal   Collection Time    06/24/14  3:32 PM      Result Value Ref Range   Troponin I 0.32 (*) <0.30 ng/mL   Comment:            Due to the release kinetics of cTnI,     a negative result within the first hours     of the onset of symptoms does not rule out     myocardial infarction with certainty.     If myocardial infarction is still suspected,     repeat the test at appropriate intervals.     CRITICAL RESULT CALLED TO, READ BACK BY AND VERIFIED WITH:     Selinda Eon RN [id number] [place], S  PROTIME-INR     Status: None   Collection Time    06/24/14  3:32 PM      Result Value Ref Range   Prothrombin Time 14.0  11.6 - 15.2 seconds   INR 1.08  0.00 - 1.49  LACTIC ACID, PLASMA     Status: Abnormal   Collection Time    06/24/14  3:32 PM      Result Value Ref Range   Lactic Acid, Venous 3.3 (*)  0.5 - 2.2 mmol/L  MAGNESIUM     Status: Abnormal   Collection Time    06/24/14  3:32 PM      Result Value Ref  Range   Magnesium 1.4 (*) 1.5 - 2.5 mg/dL  COMPREHENSIVE METABOLIC PANEL     Status: Abnormal   Collection Time    06/24/14  3:32 PM      Result Value Ref Range   Sodium 139  137 - 147 mEq/L   Potassium 5.1  3.7 - 5.3 mEq/L   Chloride 98  96 - 112 mEq/L   CO2 24  19 - 32 mEq/L   Glucose, Bld 253 (*) 70 - 99 mg/dL   BUN 15  6 - 23 mg/dL   Creatinine, Ser 1.28  0.50 - 1.35 mg/dL   Calcium 8.9  8.4 - 10.5 mg/dL   Total Protein 7.7  6.0 - 8.3 g/dL   Albumin 3.2 (*) 3.5 - 5.2 g/dL   AST 59 (*) 0 - 37 U/L   ALT 34  0 - 53 U/L   Alkaline Phosphatase 49  39 - 117 U/L   Total Bilirubin 0.6  0.3 - 1.2 mg/dL   GFR calc non Af Amer 53 (*) >90 mL/min   GFR calc Af Amer 61 (*) >90 mL/min   Comment: (NOTE)     The eGFR has been calculated using the CKD EPI equation.     This calculation has not been validated in all clinical situations.     eGFR's persistently <90 mL/min signify possible Chronic Kidney     Disease.  PRO B NATRIURETIC PEPTIDE     Status: Abnormal   Collection Time    06/24/14  3:32 PM      Result Value Ref Range   Pro B Natriuretic peptide (BNP) 5446.0 (*) 0 - 450 pg/mL  CBC WITH DIFFERENTIAL     Status: Abnormal   Collection Time    06/24/14  3:32 PM      Result Value Ref Range   WBC 7.3  4.0 - 10.5 K/uL   RBC 4.77  4.22 - 5.81 MIL/uL   Hemoglobin 14.6  13.0 - 17.0 g/dL   Comment: REPEATED TO VERIFY     DELTA CHECK NOTED   HCT 43.5  39.0 - 52.0 %   MCV 91.2  78.0 - 100.0 fL   MCH 30.6  26.0 - 34.0 pg   MCHC 33.6  30.0 - 36.0 g/dL   RDW 15.1  11.5 - 15.5 %   Platelets 203  150 - 400 K/uL   Neutrophils Relative % 91 (*) 43 - 77 %   Neutro Abs 6.6  1.7 - 7.7 K/uL   Lymphocytes Relative 7 (*) 12 - 46 %   Lymphs Abs 0.5 (*) 0.7 - 4.0 K/uL   Monocytes Relative 3  3 - 12 %   Monocytes Absolute 0.2  0.1 - 1.0 K/uL   Eosinophils Relative 0  0 - 5 %   Eosinophils Absolute 0.0  0.0 - 0.7 K/uL   Basophils Relative 0  0 - 1 %   Basophils Absolute 0.0  0.0 - 0.1 K/uL  CK  TOTAL AND CKMB     Status: Abnormal   Collection Time    06/24/14  3:32 PM      Result Value Ref Range   Total CK 150  7 - 232 U/L   CK, MB 5.3 (*) 0.3 - 4.0 ng/mL   Relative Index 3.5 (*)  0.0 - 2.5  PROCALCITONIN     Status: None   Collection Time    06/24/14  3:32 PM      Result Value Ref Range   Procalcitonin 0.16     Comment:            Interpretation:     PCT (Procalcitonin) <= 0.5 ng/mL:     Systemic infection (sepsis) is not likely.     Local bacterial infection is possible.     (NOTE)             ICU PCT Algorithm               Non ICU PCT Algorithm        ----------------------------     ------------------------------             PCT < 0.25 ng/mL                 PCT < 0.1 ng/mL         Stopping of antibiotics            Stopping of antibiotics           strongly encouraged.               strongly encouraged.        ----------------------------     ------------------------------           PCT level decrease by               PCT < 0.25 ng/mL           >= 80% from peak PCT           OR PCT 0.25 - 0.5 ng/mL          Stopping of antibiotics                                                 encouraged.         Stopping of antibiotics               encouraged.        ----------------------------     ------------------------------           PCT level decrease by              PCT >= 0.25 ng/mL           < 80% from peak PCT            AND PCT >= 0.5 ng/mL            Continuing antibiotics                                                  encouraged.           Continuing antibiotics                encouraged.        ----------------------------     ------------------------------         PCT level increase compared          PCT > 0.5 ng/mL             with  peak PCT AND              PCT >= 0.5 ng/mL             Escalation of antibiotics                                              strongly encouraged.          Escalation of antibiotics            strongly encouraged.  STREP  PNEUMONIAE URINARY ANTIGEN     Status: None   Collection Time    06/24/14  3:34 PM      Result Value Ref Range   Strep Pneumo Urinary Antigen NEGATIVE  NEGATIVE   Comment:            Infection due to S. pneumoniae     cannot be absolutely ruled out     since the antigen present     may be below the detection limit     of the test.  URINE RAPID DRUG SCREEN (HOSP PERFORMED)     Status: Abnormal   Collection Time    06/24/14  3:34 PM      Result Value Ref Range   Opiates NONE DETECTED  NONE DETECTED   Cocaine NONE DETECTED  NONE DETECTED   Benzodiazepines NONE DETECTED  NONE DETECTED   Amphetamines NONE DETECTED  NONE DETECTED   Tetrahydrocannabinol POSITIVE (*) NONE DETECTED   Barbiturates NONE DETECTED  NONE DETECTED   Comment:            DRUG SCREEN FOR MEDICAL PURPOSES     ONLY.  IF CONFIRMATION IS NEEDED     FOR ANY PURPOSE, NOTIFY LAB     WITHIN 5 DAYS.                LOWEST DETECTABLE LIMITS     FOR URINE DRUG SCREEN     Drug Class       Cutoff (ng/mL)     Amphetamine      1000     Barbiturate      200     Benzodiazepine   253     Tricyclics       664     Opiates          300     Cocaine          300     THC              50  CULTURE, RESPIRATORY (NON-EXPECTORATED)     Status: None   Collection Time    06/24/14  3:35 PM      Result Value Ref Range   Specimen Description TRACHEAL ASPIRATE     Special Requests Normal     Gram Stain       Value: RARE WBC PRESENT, PREDOMINANTLY MONONUCLEAR     NO SQUAMOUS EPITHELIAL CELLS SEEN     MODERATE GRAM POSITIVE COCCI IN PAIRS     Performed at [facility]   Culture PENDING     Report Status PENDING    POCT I-STAT 3, ART BLOOD GAS (G3+)     Status: Abnormal   Collection Time    06/24/14  3:50 PM      Result Value Ref Range   pH, Arterial 7.308 (*) 7.350 -  7.450   pCO2 arterial 56.7 (*) 35.0 - 45.0 mmHg   pO2, Arterial 74.0 (*) 80.0 - 100.0 mmHg   Bicarbonate 28.3 (*) 20.0 - 24.0 mEq/L   TCO2 30  0 - 100 mmol/L     O2 Saturation 93.0     Acid-Base Excess 1.0  0.0 - 2.0 mmol/L   Patient temperature 99.1 F     Collection site RADIAL, ALLEN'S TEST ACCEPTABLE     Drawn by Operator     Sample type ARTERIAL    GLUCOSE, CAPILLARY     Status: Abnormal   Collection Time    06/24/14  7:29 PM      Result Value Ref Range   Glucose-Capillary 203 (*) 70 - 99 mg/dL   Comment 1 Notify RN    TROPONIN I     Status: Abnormal   Collection Time    06/24/14 10:20 PM      Result Value Ref Range   Troponin I 2.52 (*) <0.30 ng/mL   Comment:            Due to the release kinetics of cTnI,     a negative result within the first hours     of the onset of symptoms does not rule out     myocardial infarction with certainty.     If myocardial infarction is still suspected,     repeat the test at appropriate intervals.     CRITICAL VALUE NOTED.  VALUE IS CONSISTENT WITH PREVIOUSLY REPORTED AND CALLED VALUE.  GLUCOSE, CAPILLARY     Status: Abnormal   Collection Time    06/24/14 11:46 PM      Result Value Ref Range   Glucose-Capillary 171 (*) 70 - 99 mg/dL   Comment 1 Notify RN    CBC     Status: None   Collection Time    06/25/14  2:05 AM      Result Value Ref Range   WBC 7.9  4.0 - 10.5 K/uL   RBC 4.41  4.22 - 5.81 MIL/uL   Hemoglobin 13.0  13.0 - 17.0 g/dL   HCT 39.6  39.0 - 52.0 %   MCV 89.8  78.0 - 100.0 fL   MCH 29.5  26.0 - 34.0 pg   MCHC 32.8  30.0 - 36.0 g/dL   RDW 15.0  11.5 - 15.5 %   Platelets 200  150 - 400 K/uL  BASIC METABOLIC PANEL     Status: Abnormal   Collection Time    06/25/14  2:05 AM      Result Value Ref Range   Sodium 138  137 - 147 mEq/L   Potassium 4.6  3.7 - 5.3 mEq/L   Chloride 98  96 - 112 mEq/L   CO2 28  19 - 32 mEq/L   Glucose, Bld 190 (*) 70 - 99 mg/dL   BUN 20  6 - 23 mg/dL   Creatinine, Ser 1.77 (*) 0.50 - 1.35 mg/dL   Calcium 8.8  8.4 - 10.5 mg/dL   GFR calc non Af Amer 36 (*) >90 mL/min   GFR calc Af Amer 42 (*) >90 mL/min   Comment: (NOTE)     The eGFR has been  calculated using the CKD EPI equation.     This calculation has not been validated in all clinical situations.     eGFR's persistently <90 mL/min signify possible Chronic Kidney     Disease.  PROCALCITONIN     Status: None  Collection Time    06/25/14  2:05 AM      Result Value Ref Range   Procalcitonin 0.65     Comment:            Interpretation:     PCT > 0.5 ng/mL and <= 2 ng/mL:     Systemic infection (sepsis) is possible,     but other conditions are known to elevate     PCT as well.     (NOTE)             ICU PCT Algorithm               Non ICU PCT Algorithm        ----------------------------     ------------------------------             PCT < 0.25 ng/mL                 PCT < 0.1 ng/mL         Stopping of antibiotics            Stopping of antibiotics           strongly encouraged.               strongly encouraged.        ----------------------------     ------------------------------           PCT level decrease by               PCT < 0.25 ng/mL           >= 80% from peak PCT           OR PCT 0.25 - 0.5 ng/mL          Stopping of antibiotics                                                 encouraged.         Stopping of antibiotics               encouraged.        ----------------------------     ------------------------------           PCT level decrease by              PCT >= 0.25 ng/mL           < 80% from peak PCT            AND PCT >= 0.5 ng/mL            Continuing antibiotics                                                  encouraged.           Continuing antibiotics                encouraged.        ----------------------------     ------------------------------         PCT level increase compared          PCT > 0.5 ng/mL             with peak PCT  AND              PCT >= 0.5 ng/mL             Escalation of antibiotics                                              strongly encouraged.          Escalation of antibiotics            strongly encouraged.  BLOOD  GAS, ARTERIAL     Status: Abnormal   Collection Time    06/25/14  3:25 AM      Result Value Ref Range   FIO2 0.50     Delivery systems VENTILATOR     Mode PRESSURE REGULATED VOLUME CONTROL     VT 600     Rate 14     Peep/cpap 5.0     pH, Arterial 7.396  7.350 - 7.450   pCO2 arterial 48.0 (*) 35.0 - 45.0 mmHg   pO2, Arterial 71.7 (*) 80.0 - 100.0 mmHg   Bicarbonate 28.9 (*) 20.0 - 24.0 mEq/L   TCO2 30.3  0 - 100 mmol/L   Acid-Base Excess 4.3 (*) 0.0 - 2.0 mmol/L   O2 Saturation 94.6     Patient temperature 98.6     Collection site RIGHT RADIAL     Drawn by [phone]     Sample type ARTERIAL DRAW     Allens test (pass/fail) PASS  PASS  GLUCOSE, CAPILLARY     Status: Abnormal   Collection Time    06/25/14  3:57 AM      Result Value Ref Range   Glucose-Capillary 221 (*) 70 - 99 mg/dL   Comment 1 Notify RN    TROPONIN I     Status: Abnormal   Collection Time    06/25/14  6:38 AM      Result Value Ref Range   Troponin I 1.23 (*) <0.30 ng/mL   Comment:            Due to the release kinetics of cTnI,     a negative result within the first hours     of the onset of symptoms does not rule out     myocardial infarction with certainty.     If myocardial infarction is still suspected,     repeat the test at appropriate intervals.     CRITICAL VALUE NOTED.  VALUE IS CONSISTENT WITH PREVIOUSLY REPORTED AND CALLED VALUE.  HEPARIN LEVEL (UNFRACTIONATED)     Status: Abnormal   Collection Time    06/25/14  7:40 AM      Result Value Ref Range   Heparin Unfractionated 0.95 (*) 0.30 - 0.70 IU/mL   Comment:            IF HEPARIN RESULTS ARE BELOW     EXPECTED VALUES, AND PATIENT     DOSAGE HAS BEEN CONFIRMED,     SUGGEST FOLLOW UP TESTING     OF ANTITHROMBIN III LEVELS.  GLUCOSE, CAPILLARY     Status: Abnormal   Collection Time    06/25/14  8:18 AM      Result Value Ref Range   Glucose-Capillary 231 (*) 70 - 99 mg/dL  POCT I-STAT 3, ART BLOOD GAS (G3+)     Status: Abnormal   Collection  Time  06/25/14 10:17 AM      Result Value Ref Range   pH, Arterial 7.416  7.350 - 7.450   pCO2 arterial 44.7  35.0 - 45.0 mmHg   pO2, Arterial 68.0 (*) 80.0 - 100.0 mmHg   Bicarbonate 28.9 (*) 20.0 - 24.0 mEq/L   TCO2 30  0 - 100 mmol/L   O2 Saturation 94.0     Acid-Base Excess 3.0 (*) 0.0 - 2.0 mmol/L   Patient temperature 97.6 F     Collection site RADIAL, ALLEN'S TEST ACCEPTABLE     Sample type ARTERIAL      IMAGING: Dg Chest Port 1 View  06/25/2014   CLINICAL DATA:  Evaluate endotracheal tube, airspace disease  EXAM: PORTABLE CHEST - 1 VIEW  COMPARISON:  06/24/2014; 11/29/2012; 07/09/2011  FINDINGS: Grossly unchanged enlarged cardiac silhouette and mediastinal contours given persistently reduced lung volumes and patient rotation. Atherosclerotic plaque within the thoracic aorta. Stable position of support apparatus. No pneumothorax. Worsening bilateral mid and lower lung heterogeneous opacities, right greater than left. Pulmonary vasculature remains indistinct with cephalization of flow. Trace bilateral effusions are not excluded, right greater than left. Grossly unchanged bones.  IMPRESSION: 1.  Stable positioning of support apparatus.  No pneumothorax. 2. Findings most suggestive of worsening pulmonary edema and bibasilar opacities, atelectasis versus infiltrate.   Electronically Signed   By: Sandi Mariscal M.D.   On: 06/25/2014 07:55   Dg Chest Portable 1 View  06/24/2014   CLINICAL DATA:  SHORTNESS OF BREATH  EXAM: PORTABLE CHEST - 1 VIEW  COMPARISON:  11/29/2012  FINDINGS: Endotracheal tube has been placed, tip approximately 5.8 cm above carina. Atheromatous aorta. Heart size upper limits normal. Moderate interstitial and airspace opacities in a predominantly perihilar and infrahilar distribution, increased since previous exam, right greater than left. No effusion. Visualized skeletal structures are unremarkable.  IMPRESSION: 1. Endotracheal tube tip 5.8 cm above carina. 2. Interval  increase in asymmetric bilateral edema/infiltrates.   Electronically Signed   By: Arne Cleveland M.D.   On: 06/24/2014 11:38   Dg Abd Portable 1v  06/24/2014   CLINICAL DATA:  OG tube placement.  EXAM: PORTABLE ABDOMEN - 1 VIEW  COMPARISON:  None.  FINDINGS: The OG tube tip is in the distal stomach. No abnormal dilated loops of bowel. No suspicious radiopaque foreign bodies are soft tissue calcifications. Calcified atherosclerotic disease involves the abdominal aorta and its branches.  IMPRESSION: 1. OG tube tip is in satisfactory position within the distal stomach.   Electronically Signed   By: Kerby Moors M.D.   On: 06/24/2014 20:59    IMPRESSION: 1. Hypercarbic respiratory failure 2. NSTEMI with pulmonary edema with low normal LV systolic function and grade 3 diastolic dysfunction 2. asymmetric bilateral pulmonary edema/infiltrates 3. history of prior non-ST segment elevation myocardial infarction, and prior PCI in 2000 4. atrial fibrillation 5. significant peripheral vascular disease, status post femoropopliteal surgery, and bilateral lower extremity PVD 6. RBBB 7. type 2 diabetes mellitus 8. COPD with 50-pack-year history of tobacco use  RECOMMENDATION:  Mr. Gadd is a 78 year old, African American gentleman with prior CAD, status post remote non-ST segment elevation myocardial infarction treated with PCI 15 years ago.  Has extensive PVD.  He presented yesterday with hypertensive emergency with initial and MAP 114.  He is in atrial fibrillation and has been on heparin anticoagulation which currently is on hold in light of plans for central line access placement.  He does have grade 3 diastolic dysfunction and has ruled in  for non-ST segment MI most likely contributed by his pulmonary edema.  Once extubated, he will most likely need reevaluation for ischemic etiology with either definitive cardiac catheterization versus risk stratification with nuclear imaging.  If extubated later  today.  I would recommend reinstitution of previous oral medications; however, would initiate carvedilol at lower dose presently with his pulmonary edema/CHF and continue IV diuresis.  Blood pressure is currently controlled at 145/82.  Will follow.   Troy Sine, MD, [facility] 06/25/2014 10:52 AM

## 2014-06-25 NOTE — Progress Notes (Signed)
UR Completed.  Vergie Living [phone] 06/25/2014

## 2014-06-25 NOTE — Progress Notes (Signed)
ANTICOAGULATION CONSULT NOTE - Follow up [place] for Heparin  Indication: chest pain/ACS  Allergies  Allergen Reactions  . Penicillins Other (See Comments)     hands and feet edema    Patient Measurements: Height: 6' (182.9 cm) Weight: 223 lb 5.2 oz (101.3 kg) IBW/kg (Calculated) : 77.6  Vital Signs: Temp: 97.6 F (36.4 C) (06/29 0858) Temp src: Oral (06/29 1100) BP: 145/82 mmHg (06/29 0800) Pulse Rate: 85 (06/29 1126)  Labs:  Recent Labs  06/24/14 1051 06/24/14 1114 06/24/14 1532 06/24/14 2220 06/25/14 0205 06/25/14 0638 06/25/14 0740  HGB 15.6 19.0* 14.6  --  13.0  --   --   HCT 47.9 56.0* 43.5  --  39.6  --   --   PLT 232  --  203  --  200  --   --   LABPROT 14.0  --  14.0  --   --   --   --   INR 1.08  --  1.08  --   --   --   --   HEPARINUNFRC  --   --   --   --   --   --  0.95*  CREATININE 1.26 1.50* 1.28  --  1.77*  --   --   CKTOTAL  --   --  150  --   --   --   --   CKMB  --   --  5.3*  --   --   --   --   TROPONINI <0.30  --  0.32* 2.52*  --  1.23*  --     Estimated Creatinine Clearance: 44.4 ml/min (by C-G formula based on Cr of 1.77).  Medical History: Past Medical History  Diagnosis Date  . Hyperlipidemia   . HTN (hypertension)     echo 2/11: EF 50-55%, diast dyfxn, severe LVH, inf HK, LAE  . CAD (coronary artery disease)     a.  NSTEMI treated with PCI Feb 2011 with a DES.(Endeavor);   b. cath 2/11: D1 stents x 2 ok, AV groove CFX occluded with dist AV CFX filled by L-L collats, RCA occluded, OM2 90-95% (treated with PCI)  . MI (myocardial infarction)   . PVD (peripheral vascular disease)   . DM (diabetes mellitus)   . GERD (gastroesophageal reflux disease)   . Bipolar disorder   . Prostate cancer     status post radiation therapy in 2003    Assessment: 78 y/o M to start heparin for elevated troponin. Heparin level his AM was supra-therapeutic at 0.95. CBC remains stable. No bleeding noted. Other labs as above.   Goal  of Therapy:  Heparin level 0.3-0.7 units/ml Monitor platelets by anticoagulation protocol: Yes   Plan:  -Decrease heparin drip to 900 units/hr -8 hr heparin level at 1630 -Daily CBC/HL -Monitor for bleeding -F/U MD plans  Albertina Parr, PharmD.  Clinical Pharmacist Pager [phone]

## 2014-06-25 NOTE — Progress Notes (Signed)
INITIAL NUTRITION ASSESSMENT  DOCUMENTATION CODES Per approved criteria  -Not Applicable   INTERVENTION:  Utilize 30M PEPuP Protocol: initiate TF via OGT with Vital AF 1.2 at 25 ml/h and Prostat 30 ml BID on day 1; on day 2, increase to goal rate of 55 ml/h (1320 ml per day) to provide 1784 kcals, 129 gm protein, 1071 ml free water daily.  Above TF regimen plus current propofol will provide a total of 2022 kcals per day.  NUTRITION DIAGNOSIS: Inadequate oral intake related to inability to eat as evidenced by NPO status.   Goal: Intake to meet >90% of estimated nutrition needs.  Monitor:  TF tolerance/adequacy, weight trend, labs, vent status.  Reason for Assessment: MD Consult for TF initiation and management.  78 y.o. male  Admitting Dx: Acute respiratory failure  ASSESSMENT: 78 y/o M, current smoker, with PMH of HTN, CAD s/p MI, PVD & bipolar disorder who presented to [facility] ER on 6/28 with hypercarbic respiratory failure & hypertensive emergency.   Patient is currently intubated on ventilator support MV: 8.6 L/min Temp (24hrs), Avg:98.2 F (36.8 C), Min:97.6 F (36.4 C), Max:99.1 F (37.3 C)  Propofol: 9 ml/hr providing 238 kcals/day  Height: Ht Readings from Last 1 Encounters:  06/25/14 6' (1.829 m)    Weight: Wt Readings from Last 1 Encounters:  06/25/14 223 lb 5.2 oz (101.3 kg)  06/24/14  220 lb 7.4 oz (100 kg)  Suspect weight is elevated due to edema in LEs.  Ideal Body Weight: 80.9 kg  % Ideal Body Weight: 125%  Wt Readings from Last 10 Encounters:  06/25/14 223 lb 5.2 oz (101.3 kg)  09/22/13 226 lb (102.513 kg)  07/12/13 220 lb (99.791 kg)  09/16/12 243 lb 6.4 oz (110.406 kg)  02/02/12 243 lb (110.224 kg)  08/04/11 241 lb (109.317 kg)  04/28/11 233 lb (105.688 kg)  04/20/11 238 lb (107.956 kg)  03/16/11 230 lb (104.327 kg)  03/11/10 229 lb 8 oz (104.101 kg)    Usual Body Weight: 220-226 lb 1 year ago  % Usual Body Weight: 100%  BMI:  Body  mass index is 30.28 kg/(m^2). BMI=29.9 using admit weight  Estimated Nutritional Needs: Kcal: 1991 Protein: 120-130 gm Fluid: 2 L  Skin: intact  Diet Order: NPO  EDUCATION NEEDS: -Education not appropriate at this time   Intake/Output Summary (Last 24 hours) at 06/25/14 1055 Last data filed at 06/25/14 1000  Gross per 24 hour  Intake 1005.94 ml  Output    708 ml  Net 297.94 ml    Last BM: None documented since admission   Labs:   Recent Labs Lab 06/24/14 1051 06/24/14 1114 06/24/14 1532 06/25/14 0205  NA 138 141 139 138  K 5.0 4.7 5.1 4.6  CL 96 97 98 98  CO2 25  --  24 28  BUN 12 13 15 20   CREATININE 1.26 1.50* 1.28 1.77*  CALCIUM 9.1  --  8.9 8.8  MG  --   --  1.4*  --   GLUCOSE 264* 268* 253* 190*    CBG (last 3)   Recent Labs  06/24/14 2346 06/25/14 0357 06/25/14 0818  GLUCAP 171* 221* 231*    Scheduled Meds: . antiseptic oral rinse  15 mL Mouth Rinse QID  . aspirin  324 mg Oral Daily   Or  . aspirin  300 mg Rectal Daily  . atorvastatin  40 mg Oral q1800  . carvedilol  12.5 mg Oral BID WC  . chlorhexidine  15 mL Mouth Rinse BID  . cilostazol  50 mg Oral BID  . diltiazem  30 mg Oral QID  . doxycycline (VIBRAMYCIN) IV  100 mg Intravenous Q12H  . feeding supplement (PRO-STAT SUGAR FREE 64)  30 mL Per Tube BID  . feeding supplement (VITAL HIGH PROTEIN)  1,000 mL Per Tube Q24H  . fentaNYL  50 mcg Intravenous Once  . finasteride  5 mg Oral Daily  . gabapentin  300 mg Oral TID  . insulin aspart  0-20 Units Subcutaneous 6 times per day  . insulin glargine  20 Units Subcutaneous QHS  . ipratropium-albuterol  3 mL Nebulization Q6H  . latanoprost  1 drop Both Eyes QHS  . methylPREDNISolone (SOLU-MEDROL) injection  40 mg Intravenous Q12H  . pantoprazole (PROTONIX) IV  40 mg Intravenous QHS  . tamsulosin  0.4 mg Oral Daily  . thiamine IV  100 mg Intravenous Daily    Continuous Infusions: . sodium chloride 10 mL/hr at 06/25/14 0211  . heparin  900 Units/hr (06/25/14 0830)  . niCARDipine    . propofol 15 mcg/kg/min (06/25/14 0810)    Past Medical History  Diagnosis Date  . Hyperlipidemia   . HTN (hypertension)     echo 2/11: EF 50-55%, diast dyfxn, severe LVH, inf HK, LAE  . CAD (coronary artery disease)     a.  NSTEMI treated with PCI Feb 2011 with a DES.(Endeavor);   b. cath 2/11: D1 stents x 2 ok, AV groove CFX occluded with dist AV CFX filled by L-L collats, RCA occluded, OM2 90-95% (treated with PCI)  . MI (myocardial infarction)   . PVD (peripheral vascular disease)   . DM (diabetes mellitus)   . GERD (gastroesophageal reflux disease)   . Bipolar disorder   . Prostate cancer     status post radiation therapy in 2003    Past Surgical History  Procedure Laterality Date  . Femoral bypass  2003    Molli Barrows, RD, LDN, [place] Pager [phone] After Hours Pager [phone]

## 2014-06-25 NOTE — Progress Notes (Signed)
PULMONARY / CRITICAL CARE MEDICINE   Name: [name] MRN: [id number] DOB: 06/30/1936   PCP Beacher May, MD   ADMISSION DATE:  06/24/2014  REFERRING MD :  Dr. Wyvonnia Dusky  PRIMARY SERVICE: PCCM   CHIEF COMPLAINT:  Acute respiratory failure  BRIEF PATIENT DESCRIPTION: 78 y/o M, current smoker, with PMH of HTN, CAD s/p MI, PVD & bipolar disorder who presented to [facility] ER on 6/28 with hypercarbic respiratory failure & hypertensive emergency.   SIGNIFICANT EVENTS / STUDIES:  6/28 - Admit with hypercarbic respiratory failure / hypertensive emergency 6/28 echo - 50% to 55%. Hypokinesis of the inferior myocardium. Doppler parameters are consistent with a reversible restrictive pattern, grade 3 diastolic   9/41- weaning  LINES / TUBES: OETT 6/28 >>>  CULTURES: Sputum 6/28 >>> Resp virus 6/28 >> Urine strep 6/28 >> Blood culture 6/28 >> Urine Tox 6/28 >>   ANTIBIOTICS: Doxy 06/24/14 >>   SUBJECTIVE:   VITAL SIGNS: Temp:  [97.6 F (36.4 C)-99.1 F (37.3 C)] 97.6 F (36.4 C) (06/29 0858) Pulse Rate:  [86-115] 92 (06/29 0800) Resp:  [13-29] 14 (06/29 0800) BP: (122-216)/(57-165) 145/82 mmHg (06/29 0800) SpO2:  [88 %-100 %] 100 % (06/29 0800) FiO2 (%):  [40 %-100 %] 40 % (06/29 0800) Weight:  [100 kg (220 lb 7.4 oz)-101.3 kg (223 lb 5.2 oz)] 101.3 kg (223 lb 5.2 oz) (06/29 0600) HEMODYNAMICS:   VENTILATOR SETTINGS: Vent Mode:  [-] PRVC FiO2 (%):  [40 %-100 %] 40 % Set Rate:  [14 bmp-22 bmp] 14 bmp Vt Set:  [600 mL] 600 mL PEEP:  [5 cmH20] 5 cmH20 Plateau Pressure:  [15 cmH20-22 cmH20] 16 cmH20 INTAKE / OUTPUT: Intake/Output     06/28 0701 - 06/29 0700 06/29 0701 - 06/30 0700   I.V. (mL/kg) 658.4 (6.5) 39 (0.4)   IV Piggyback 250    Total Intake(mL/kg) 908.4 (9) 39 (0.4)   Urine (mL/kg/hr) 618 45 (0.2)   Total Output 618 45   Net +290.4 -6          PHYSICAL EXAMINATION: General: rass -1, vented Neuro: not following commands, WD to pain, rass -1 HEENT:  jvd PULM: Clear rediced CV: s1 s2 irr no r GI: soft, bs wnl , no r/g Extremities: edema lowers   LABS: PULMONARY  Recent Labs Lab 06/24/14 1100 06/24/14 1114 06/24/14 1550 06/25/14 0325  PHART 7.194*  --  7.308* 7.396  PCO2ART 75.8*  --  56.7* 48.0*  PO2ART 182.0*  --  74.0* 71.7*  HCO3 29.3*  --  28.3* 28.9*  TCO2 32 32 30 30.3  O2SAT 99.0  --  93.0 94.6    CBC  Recent Labs Lab 06/24/14 1051 06/24/14 1114 06/24/14 1532 06/25/14 0205  HGB 15.6 19.0* 14.6 13.0  HCT 47.9 56.0* 43.5 39.6  WBC 9.4  --  7.3 7.9  PLT 232  --  203 200    COAGULATION  Recent Labs Lab 06/24/14 1051 06/24/14 1532  INR 1.08 1.08    CARDIAC    Recent Labs Lab 06/24/14 1051 06/24/14 1532 06/24/14 2220 06/25/14 0638  TROPONINI <0.30 0.32* 2.52* 1.23*    Recent Labs Lab 06/24/14 1051 06/24/14 1532  PROBNP 4355.0* 5446.0*     CHEMISTRY  Recent Labs Lab 06/24/14 1051 06/24/14 1114 06/24/14 1532 06/25/14 0205  NA 138 141 139 138  K 5.0 4.7 5.1 4.6  CL 96 97 98 98  CO2 25  --  24 28  GLUCOSE 264* 268* 253* 190*  BUN 12 13 15 20   CREATININE 1.26 1.50* 1.28 1.77*  CALCIUM 9.1  --  8.9 8.8  MG  --   --  1.4*  --    Estimated Creatinine Clearance: 44.4 ml/min (by C-G formula based on Cr of 1.77).   LIVER  Recent Labs Lab 06/24/14 1051 06/24/14 1532  AST 46* 59*  ALT 26 34  ALKPHOS 52 49  BILITOT 0.6 0.6  PROT 8.5* 7.7  ALBUMIN 3.5 3.2*  INR 1.08 1.08     INFECTIOUS  Recent Labs Lab 06/24/14 1115 06/24/14 1532 06/25/14 0205  LATICACIDVEN 3.56* 3.3*  --   PROCALCITON  --  0.16 0.65     ENDOCRINE CBG (last 3)   Recent Labs  06/24/14 2346 06/25/14 0357 06/25/14 0818  GLUCAP 171* 221* 231*         IMAGING x48h  Dg Chest Port 1 View  06/25/2014   CLINICAL DATA:  Evaluate endotracheal tube, airspace disease  EXAM: PORTABLE CHEST - 1 VIEW  COMPARISON:  06/24/2014; 11/29/2012; 07/09/2011  FINDINGS: Grossly unchanged enlarged  cardiac silhouette and mediastinal contours given persistently reduced lung volumes and patient rotation. Atherosclerotic plaque within the thoracic aorta. Stable position of support apparatus. No pneumothorax. Worsening bilateral mid and lower lung heterogeneous opacities, right greater than left. Pulmonary vasculature remains indistinct with cephalization of flow. Trace bilateral effusions are not excluded, right greater than left. Grossly unchanged bones.  IMPRESSION: 1.  Stable positioning of support apparatus.  No pneumothorax. 2. Findings most suggestive of worsening pulmonary edema and bibasilar opacities, atelectasis versus infiltrate.   Electronically Signed   By: Sandi Mariscal M.D.   On: 06/25/2014 07:55   Dg Chest Portable 1 View  06/24/2014   CLINICAL DATA:  SHORTNESS OF BREATH  EXAM: PORTABLE CHEST - 1 VIEW  COMPARISON:  11/29/2012  FINDINGS: Endotracheal tube has been placed, tip approximately 5.8 cm above carina. Atheromatous aorta. Heart size upper limits normal. Moderate interstitial and airspace opacities in a predominantly perihilar and infrahilar distribution, increased since previous exam, right greater than left. No effusion. Visualized skeletal structures are unremarkable.  IMPRESSION: 1. Endotracheal tube tip 5.8 cm above carina. 2. Interval increase in asymmetric bilateral edema/infiltrates.   Electronically Signed   By: Arne Cleveland M.D.   On: 06/24/2014 11:38   Dg Abd Portable 1v  06/24/2014   CLINICAL DATA:  OG tube placement.  EXAM: PORTABLE ABDOMEN - 1 VIEW  COMPARISON:  None.  FINDINGS: The OG tube tip is in the distal stomach. No abnormal dilated loops of bowel. No suspicious radiopaque foreign bodies are soft tissue calcifications. Calcified atherosclerotic disease involves the abdominal aorta and its branches.  IMPRESSION: 1. OG tube tip is in satisfactory position within the distal stomach.   Electronically Signed   By: Kerby Moors M.D.   On: 06/24/2014 20:59       ASSESSMENT / PLAN:  PULMONARY A: Acute resp failure - edema? Baseline 50 pack smoker: with COPD NOS, Several months of chronic cough CHF NOS/acute pulm edema acute v AECOPD  P:   ABG reviewed, keep same MV BDers Reduce steroids Weaning cpap 5 ps 5, goal 2 hrs Neurostatus does not support extubation as of now Consider neg balance   CARDIOVASCULAR A:  Atrial Fibrillation  Hypertensive Emergency - initial MAP 114 CAD s/p MI + Stents - c/o [facility] PVD  Diastolic heart failure, acute R/o MI, stress ischemia, NSTEMI  P:  Reduction of BP by 25% initial MAP, goal MAP  85 Continue home ASA, lipitor, cardizem, pletal Hold home lisinopril  Cards consult Hep drip remain  RENAL A:   Hx of prostate cancer - 2003 Acute on Chronic Renal Insufficiency : failed Coude and Foley in ER 06/24/2014 ARF, ATN? P:   bmet in am Allow pos balance further, hold lasix  Increase rate fluids  GASTROINTESTINAL A:   Ventilator Associated Dysphagia  P:   OGT Start T Fm, pep up  HEMATOLOGIC A:   dvt prev  P:  DVT: heparin drip Monitor CBC   INFECTIOUS A:   AECOPD P:   5 days doxycycline , likely can dc in am if clinical status anc pct improved Check PCT, notrd, keep abx and follow further  ENDOCRINE A:   Diabetes Mellitus  P:   lantus Start TF,m may need increase  SSI   NEUROLOGIC A:   Daily bourbon intake per daughter. At risk for etoh withdrawal Acute Encephalopathy - in setting of hypercarbic respiratory failure P:   RASS goal: -1 Sedation protocol - propofol gtt + fentanyl prn Thiamine, folic add WUA  Ccm time 35 min   Lavon Paganini. Titus Mould, MD, [place] Pgr: [facility]

## 2014-06-25 NOTE — Procedures (Signed)
Extubation Procedure Note  Patient Details:   Name: [name] DOB: [birth year] MRN: [id number]   Airway Documentation:     Evaluation  O2 sats: stable throughout Complications: No apparent complications Patient did tolerate procedure well. Bilateral Breath Sounds: Clear;Diminished Suctioning: Airway Yes Patient extubated to [name]. Vital signs stable. No complications. RT will continue to monitor.  Mcneil Sober 06/25/2014, 4:07 PM

## 2014-06-26 ENCOUNTER — Inpatient Hospital Stay (HOSPITAL_COMMUNITY): Payer: PRIVATE HEALTH INSURANCE

## 2014-06-26 ENCOUNTER — Encounter (HOSPITAL_COMMUNITY): Payer: Self-pay | Admitting: Interventional Cardiology

## 2014-06-26 DIAGNOSIS — J811 Chronic pulmonary edema: Secondary | ICD-10-CM | POA: Diagnosis present

## 2014-06-26 DIAGNOSIS — I4891 Unspecified atrial fibrillation: Secondary | ICD-10-CM

## 2014-06-26 DIAGNOSIS — I482 Chronic atrial fibrillation, unspecified: Secondary | ICD-10-CM | POA: Diagnosis present

## 2014-06-26 DIAGNOSIS — J810 Acute pulmonary edema: Secondary | ICD-10-CM

## 2014-06-26 LAB — URINE CULTURE
COLONY COUNT: NO GROWTH
Culture: NO GROWTH

## 2014-06-26 LAB — GLUCOSE, CAPILLARY
GLUCOSE-CAPILLARY: 312 mg/dL — AB (ref 70–99)
GLUCOSE-CAPILLARY: 328 mg/dL — AB (ref 70–99)
GLUCOSE-CAPILLARY: 370 mg/dL — AB (ref 70–99)
Glucose-Capillary: 271 mg/dL — ABNORMAL HIGH (ref 70–99)
Glucose-Capillary: 212 mg/dL — ABNORMAL HIGH (ref 70–99)
Glucose-Capillary: 245 mg/dL — ABNORMAL HIGH (ref 70–99)

## 2014-06-26 LAB — COMPREHENSIVE METABOLIC PANEL
ALK PHOS: 43 U/L (ref 39–117)
ALT: 21 U/L (ref 0–53)
AST: 23 U/L (ref 0–37)
Albumin: 2.5 g/dL — ABNORMAL LOW (ref 3.5–5.2)
BILIRUBIN TOTAL: 0.4 mg/dL (ref 0.3–1.2)
BUN: 35 mg/dL — ABNORMAL HIGH (ref 6–23)
CO2: 26 mEq/L (ref 19–32)
CREATININE: 1.99 mg/dL — AB (ref 0.50–1.35)
Calcium: 8.3 mg/dL — ABNORMAL LOW (ref 8.4–10.5)
Chloride: 96 mEq/L (ref 96–112)
GFR calc non Af Amer: 31 mL/min — ABNORMAL LOW (ref >90)
GFR, EST AFRICAN AMERICAN: 36 mL/min — AB (ref >90)
GLUCOSE: 383 mg/dL — AB (ref 70–99)
Potassium: 4.4 mEq/L (ref 3.7–5.3)
Sodium: 134 mEq/L — ABNORMAL LOW (ref 137–147)
TOTAL PROTEIN: 6.3 g/dL (ref 6.0–8.3)

## 2014-06-26 LAB — CBC WITH DIFFERENTIAL/PLATELET
Basophils Absolute: 0 10*3/uL (ref 0.0–0.1)
Basophils Relative: 0 % (ref 0–1)
EOS PCT: 0 % (ref 0–5)
Eosinophils Absolute: 0 10*3/uL (ref 0.0–0.7)
HEMATOCRIT: 41.2 % (ref 39.0–52.0)
Hemoglobin: 13.5 g/dL (ref 13.0–17.0)
LYMPHS ABS: 0.8 10*3/uL (ref 0.7–4.0)
LYMPHS PCT: 6 % — AB (ref 12–46)
MCH: 29 pg (ref 26.0–34.0)
MCHC: 32.8 g/dL (ref 30.0–36.0)
MCV: 88.6 fL (ref 78.0–100.0)
Monocytes Absolute: 0.3 10*3/uL (ref 0.1–1.0)
Monocytes Relative: 3 % (ref 3–12)
Neutro Abs: 12.4 10*3/uL — ABNORMAL HIGH (ref 1.7–7.7)
Neutrophils Relative %: 91 % — ABNORMAL HIGH (ref 43–77)
PLATELETS: 209 10*3/uL (ref 150–400)
RBC: 4.65 MIL/uL (ref 4.22–5.81)
RDW: 14.9 % (ref 11.5–15.5)
WBC: 13.5 10*3/uL — ABNORMAL HIGH (ref 4.0–10.5)

## 2014-06-26 LAB — HEPARIN LEVEL (UNFRACTIONATED)
Heparin Unfractionated: 0.14 IU/mL — ABNORMAL LOW (ref 0.30–0.70)
Heparin Unfractionated: 0.22 IU/mL — ABNORMAL LOW (ref 0.30–0.70)
Heparin Unfractionated: 0.62 IU/mL (ref 0.30–0.70)

## 2014-06-26 LAB — PROCALCITONIN: Procalcitonin: 0.45 ng/mL

## 2014-06-26 MED ORDER — INSULIN GLARGINE 100 UNIT/ML ~~LOC~~ SOLN
30.0000 [IU] | Freq: Every day | SUBCUTANEOUS | Status: DC
  Administered 2014-06-26: 30 [IU] via SUBCUTANEOUS
  Filled 2014-06-26 (×2): qty 0.3

## 2014-06-26 MED ORDER — METHYLPREDNISOLONE SODIUM SUCC 40 MG IJ SOLR
40.0000 mg | Freq: Every day | INTRAMUSCULAR | Status: DC
  Filled 2014-06-26: qty 1

## 2014-06-26 MED ORDER — HEPARIN (PORCINE) IN NACL 100-0.45 UNIT/ML-% IJ SOLN
1300.0000 [IU]/h | INTRAMUSCULAR | Status: DC
  Administered 2014-06-26: 1300 [IU]/h via INTRAVENOUS
  Filled 2014-06-26 (×2): qty 250

## 2014-06-26 MED ORDER — MAGNESIUM SULFATE 50 % IJ SOLN
4.0000 g | Freq: Once | INTRAMUSCULAR | Status: AC
Start: 2014-06-26 — End: 2014-06-26
  Administered 2014-06-26: 4 g via INTRAVENOUS
  Filled 2014-06-26: qty 8

## 2014-06-26 NOTE — Progress Notes (Signed)
Report called to receiving nurse (natlie) via phone. Past medical history and present hospitalization course reported. All questions answered. Family member/Daughter at bedside aware of transport to 3E30. All belongings transported with patient.

## 2014-06-26 NOTE — Progress Notes (Signed)
Pt transferred from 40M via stretcher.  A&x4.  Family at the bedside.  Oriented to room.  Verbalized understanding.  Call bell at reach and instructed to call for assistance.  Will continue to monitor.  Karie Kirks, Therapist, sports.

## 2014-06-26 NOTE — Progress Notes (Signed)
Inpatient Diabetes Program Recommendations  AACE/ADA: New Consensus Statement on Inpatient Glycemic Control (2013)  Target Ranges:  Prepandial:   less than 140 mg/dL      Peak postprandial:   less than 180 mg/dL (1-2 hours)      Critically ill patients:  140 - 180 mg/dL   Reason for Assessment: Hyperglycemia  Diabetes history: Type 2 diabetes Outpatient Diabetes medications: Lantus 40 units daily, Regular 8 units bid with meals Current orders for Inpatient glycemic control: Lantus 20 units, resistant correction q 4 hours   Results for EXODUS, KUTZER (MRN [id number]) as of 06/26/2014 13:32  Ref. Range 06/25/2014 08:18 06/25/2014 12:40 06/25/2014 16:55 06/25/2014 19:54 06/25/2014 23:33 06/26/2014 03:58 06/26/2014 08:00 06/26/2014 12:40  Glucose-Capillary Latest Range: 70-99 mg/dL 231 (H) 189 (H) 224 (H) 368 (H) 370 (H) 271 (H) 212 (H) 328 (H)   Note:  Request MD consider increasing Lantus dosage to 30 units and add Novolog 4 to 6 units tid with meals to be held if patient doesn't eat at least 50% or CBG is less than 80 mg/dl.  Thank you.  Patti S. Marcelline Mates, RN, CNS, CDE Inpatient Diabetes Program, team pager [phone]

## 2014-06-26 NOTE — Progress Notes (Signed)
ANTICOAGULATION CONSULT NOTE - Follow up Consult  Pharmacy Consult:  Heparin  Indication: chest pain/ACS  Allergies  Allergen Reactions  . Penicillins Other (See Comments)     hands and feet edema    Patient Measurements: Height: 6' (182.9 cm) Weight: 227 lb 4.7 oz (103.1 kg) IBW/kg (Calculated) : 77.6  Vital Signs: Temp: 97.5 F (36.4 C) (06/30 1807) Temp src: Oral (06/30 1807) BP: 147/67 mmHg (06/30 1807) Pulse Rate: 88 (06/30 1807)  Labs:  Recent Labs  06/24/14 1051  06/24/14 1532 06/24/14 2220 06/25/14 0205 06/25/14 0638  06/26/14 0113 06/26/14 1000 06/26/14 1953  HGB 15.6  < > 14.6  --  13.0  --   --  13.5  --   --   HCT 47.9  < > 43.5  --  39.6  --   --  41.2  --   --   PLT 232  --  203  --  200  --   --  209  --   --   LABPROT 14.0  --  14.0  --   --   --   --   --   --   --   INR 1.08  --  1.08  --   --   --   --   --   --   --   HEPARINUNFRC  --   --   --   --   --   --   < > 0.14* 0.22* 0.62  CREATININE 1.26  < > 1.28  --  1.77*  --   --  1.99*  --   --   CKTOTAL  --   --  150  --   --   --   --   --   --   --   CKMB  --   --  5.3*  --   --   --   --   --   --   --   TROPONINI <0.30  --  0.32* 2.52*  --  1.23*  --   --   --   --   < > = values in this interval not displayed.  Estimated Creatinine Clearance: 39.8 ml/min (by C-G formula based on Cr of 1.99).    Assessment: 28 YOM started on IV heparin for ACS.  Heparin level now therapeutic after previous rate increase. No bleeding reported by RN. Line is infusing well.    Goal of Therapy:  Heparin level 0.3-0.7 units/ml Monitor platelets by anticoagulation protocol: Yes    Plan: - Continue heparin infusion at 1300 units/hr - Daily HL / CBC   Albertina Parr, PharmD.  Clinical Pharmacist Pager [phone]

## 2014-06-26 NOTE — Progress Notes (Signed)
ANTICOAGULATION CONSULT NOTE - Follow up Consult  Pharmacy Consult:  Heparin  Indication: chest pain/ACS  Allergies  Allergen Reactions  . Penicillins Other (See Comments)     hands and feet edema    Patient Measurements: Height: 6' (182.9 cm) Weight: 223 lb 1.7 oz (101.2 kg) IBW/kg (Calculated) : 77.6  Vital Signs: Temp: 97.8 F (36.6 C) (06/30 0923) Temp src: Oral (06/30 0923) BP: 137/95 mmHg (06/30 0847) Pulse Rate: 104 (06/30 0847)  Labs:  Recent Labs  06/24/14 1051  06/24/14 1532 06/24/14 2220 06/25/14 0205 06/25/14 5486 06/25/14 0740 06/26/14 0113 06/26/14 1000  HGB 15.6  < > 14.6  --  13.0  --   --  13.5  --   HCT 47.9  < > 43.5  --  39.6  --   --  41.2  --   PLT 232  --  203  --  200  --   --  209  --   LABPROT 14.0  --  14.0  --   --   --   --   --   --   INR 1.08  --  1.08  --   --   --   --   --   --   HEPARINUNFRC  --   --   --   --   --   --  0.95* 0.14* 0.22*  CREATININE 1.26  < > 1.28  --  1.77*  --   --  1.99*  --   CKTOTAL  --   --  150  --   --   --   --   --   --   CKMB  --   --  5.3*  --   --   --   --   --   --   TROPONINI <0.30  --  0.32* 2.52*  --  1.23*  --   --   --   < > = values in this interval not displayed.  Estimated Creatinine Clearance: 39.5 ml/min (by C-G formula based on Cr of 1.99).    Assessment: 72 YOM started on IV heparin for ACS.  Heparin level remains sub-therapeutic despite rate adjustment.  No bleeding reported.   Goal of Therapy:  Heparin level 0.3-0.7 units/ml Monitor platelets by anticoagulation protocol: Yes    Plan: - Increase heparin gtt to 1300 units/hr - Check 8 hr HL - Daily HL / CBC    Thuy D. Mina Marble, PharmD, BCPS Pager:  [phone] 06/26/2014, 11:36 AM

## 2014-06-26 NOTE — Progress Notes (Signed)
[place] for Heparin  Indication: Afib/NSTEMI  Allergies  Allergen Reactions  . Penicillins Other (See Comments)     hands and feet edema    Patient Measurements: Height: 6' (182.9 cm) Weight: 223 lb 5.2 oz (101.3 kg) IBW/kg (Calculated) : 77.6  Vital Signs: Temp: 98.3 F (36.8 C) (06/30 0034) Temp src: Oral (06/30 0034) BP: 152/64 mmHg (06/30 0100) Pulse Rate: 92 (06/30 0100)  Labs:  Recent Labs  06/24/14 1051 06/24/14 1114 06/24/14 1532 06/24/14 2220 06/25/14 0205 06/25/14 0638 06/25/14 0740 06/26/14 0113  HGB 15.6 19.0* 14.6  --  13.0  --   --  13.5  HCT 47.9 56.0* 43.5  --  39.6  --   --  41.2  PLT 232  --  203  --  200  --   --  PENDING  LABPROT 14.0  --  14.0  --   --   --   --   --   INR 1.08  --  1.08  --   --   --   --   --   HEPARINUNFRC  --   --   --   --   --   --  0.95* 0.14*  CREATININE 1.26 1.50* 1.28  --  1.77*  --   --   --   CKTOTAL  --   --  150  --   --   --   --   --   CKMB  --   --  5.3*  --   --   --   --   --   TROPONINI <0.30  --  0.32* 2.52*  --  1.23*  --   --     Estimated Creatinine Clearance: 44.4 ml/min (by C-G formula based on Cr of 1.77).  Assessment: 78 yo male with VDRF/Afib/NSTEMI for heparin.    Goal of Therapy:  Heparin level 0.3-0.7 units/ml Monitor platelets by anticoagulation protocol: Yes   Plan:  Increase heparin 1100 units/hr Follow-up am labs.   Abbott, Bronson Curb 06/26/2014,1:57 AM

## 2014-06-26 NOTE — Progress Notes (Signed)
SUBJECTIVE:  No CP.  No SHOB.  OBJECTIVE:   Vitals:   Filed Vitals:   06/26/14 0754 06/26/14 0800 06/26/14 0847 06/26/14 0923  BP:  142/86 137/95   Pulse:  97 104   Temp:    97.8 F (36.6 C)  TempSrc:  Oral  Oral  Resp:  22    Height:      Weight:      SpO2: 97% 100%     I&O's:   Intake/Output Summary (Last 24 hours) at 06/26/14 1135 Last data filed at 06/26/14 0800  Gross per 24 hour  Intake 1132.5 ml  Output   1730 ml  Net -597.5 ml   TELEMETRY: Reviewed telemetry pt in AFib, rate controlled:     PHYSICAL EXAM General: Well developed, well nourished, in no acute distress Head:   Normal cephalic and atramatic  Lungs:   Bilateral wheezing Heart:  Irregularly irregular S1 S2  No JVD.   Abdomen: abdomen soft and non-tender Msk:  Back normal,  Normal strength and tone for age. Extremities:   No edema.   Neuro: Alert and oriented. Psych:  Normal affect, responds appropriately   LABS: Basic Metabolic Panel:  Recent Labs  06/24/14 1532 06/25/14 0205 06/25/14 1045 06/26/14 0113  NA 139 138  --  134*  K 5.1 4.6  --  4.4  CL 98 98  --  96  CO2 24 28  --  26  GLUCOSE 253* 190*  --  383*  BUN 15 20  --  35*  CREATININE 1.28 1.77*  --  1.99*  CALCIUM 8.9 8.8  --  8.3*  MG 1.4*  --  1.3*  --   PHOS  --   --  3.0  --    Liver Function Tests:  Recent Labs  06/24/14 1532 06/26/14 0113  AST 59* 23  ALT 34 21  ALKPHOS 49 43  BILITOT 0.6 0.4  PROT 7.7 6.3  ALBUMIN 3.2* 2.5*   No results found for this basename: LIPASE, AMYLASE,  in the last 72 hours CBC:  Recent Labs  06/24/14 1532 06/25/14 0205 06/26/14 0113  WBC 7.3 7.9 13.5*  NEUTROABS 6.6  --  12.4*  HGB 14.6 13.0 13.5  HCT 43.5 39.6 41.2  MCV 91.2 89.8 88.6  PLT 203 200 209   Cardiac Enzymes:  Recent Labs  06/24/14 1532 06/24/14 2220 06/25/14 0638  CKTOTAL 150  --   --   CKMB 5.3*  --   --   TROPONINI 0.32* 2.52* 1.23*   BNP: No components found with this basename: POCBNP,    D-Dimer: No results found for this basename: DDIMER,  in the last 72 hours Hemoglobin A1C: No results found for this basename: HGBA1C,  in the last 72 hours Fasting Lipid Panel: No results found for this basename: CHOL, HDL, LDLCALC, TRIG, CHOLHDL, LDLDIRECT,  in the last 72 hours Thyroid Function Tests: No results found for this basename: TSH, T4TOTAL, FREET3, T3FREE, THYROIDAB,  in the last 72 hours Anemia Panel: No results found for this basename: VITAMINB12, FOLATE, FERRITIN, TIBC, IRON, RETICCTPCT,  in the last 72 hours Coag Panel:   Lab Results  Component Value Date   INR 1.08 06/24/2014   INR 1.08 06/24/2014   INR 1.27 02/19/2010    RADIOLOGY: Dg Chest Port 1 View  06/26/2014   CLINICAL DATA:  Followup edema  EXAM: PORTABLE CHEST - 1 VIEW  COMPARISON:  06/25/2014  FINDINGS: Endotracheal tube removed.  Central venous  catheter tip in the SVC.  Interval improvement in pulmonary edema. Improvement in bibasilar atelectasis which remains. Small right effusion has improved.  IMPRESSION: Significant improvement in pulmonary edema. Mild bibasilar atelectasis remains.  Endotracheal tube removed.   Electronically Signed   By: Franchot Gallo M.D.   On: 06/26/2014 07:26   Dg Chest Port 1 View  06/25/2014   CLINICAL DATA:  Central line placement.  EXAM: PORTABLE CHEST - 1 VIEW  COMPARISON:  06/25/2014.  FINDINGS: The endotracheal tube and NG tubes are stable. There is a new right IJ central venous catheter with its tip at the level of the carina in the region of the mid SVC. The heart and lungs are stable.  IMPRESSION: Right IJ central venous catheter in good position with its tip in the mid SVC. No complicating features such as pneumothorax.  Stable mild cardiac enlargement, pulmonary vascular congestion and possible mild edema.   Electronically Signed   By: Kalman Jewels M.D.   On: 06/25/2014 12:27   Dg Chest Port 1 View  06/25/2014   CLINICAL DATA:  Evaluate endotracheal tube, airspace disease   EXAM: PORTABLE CHEST - 1 VIEW  COMPARISON:  06/24/2014; 11/29/2012; 07/09/2011  FINDINGS: Grossly unchanged enlarged cardiac silhouette and mediastinal contours given persistently reduced lung volumes and patient rotation. Atherosclerotic plaque within the thoracic aorta. Stable position of support apparatus. No pneumothorax. Worsening bilateral mid and lower lung heterogeneous opacities, right greater than left. Pulmonary vasculature remains indistinct with cephalization of flow. Trace bilateral effusions are not excluded, right greater than left. Grossly unchanged bones.  IMPRESSION: 1.  Stable positioning of support apparatus.  No pneumothorax. 2. Findings most suggestive of worsening pulmonary edema and bibasilar opacities, atelectasis versus infiltrate.   Electronically Signed   By: Sandi Mariscal M.D.   On: 06/25/2014 07:55   Dg Chest Portable 1 View  06/24/2014   CLINICAL DATA:  SHORTNESS OF BREATH  EXAM: PORTABLE CHEST - 1 VIEW  COMPARISON:  11/29/2012  FINDINGS: Endotracheal tube has been placed, tip approximately 5.8 cm above carina. Atheromatous aorta. Heart size upper limits normal. Moderate interstitial and airspace opacities in a predominantly perihilar and infrahilar distribution, increased since previous exam, right greater than left. No effusion. Visualized skeletal structures are unremarkable.  IMPRESSION: 1. Endotracheal tube tip 5.8 cm above carina. 2. Interval increase in asymmetric bilateral edema/infiltrates.   Electronically Signed   By: Arne Cleveland M.D.   On: 06/24/2014 11:38   Dg Abd Portable 1v  06/24/2014   CLINICAL DATA:  OG tube placement.  EXAM: PORTABLE ABDOMEN - 1 VIEW  COMPARISON:  None.  FINDINGS: The OG tube tip is in the distal stomach. No abnormal dilated loops of bowel. No suspicious radiopaque foreign bodies are soft tissue calcifications. Calcified atherosclerotic disease involves the abdominal aorta and its branches.  IMPRESSION: 1. OG tube tip is in satisfactory  position within the distal stomach.   Electronically Signed   By: Kerby Moors M.D.   On: 06/24/2014 20:59      ASSESSMENT: AFib, CAD, NSTEMI  PLAN:  Rate controlled.  Heparin for stroke prevention.  Chronic renal insufficiency.  Would benefit from long term anticoagulation.  Would have to consider Coumadin given renal insufficiency.  NOAC would have to be dosed with pharmacy given renal dysfunction.    NSTEMI: Currently CP free.  Restarting medical therapy.  COuld add isosorbide if angina returns.  Renal insufficiency would increase risk of cath.  Diastolic heart failure:  Likely related to AFib  with RVR causing pulmonary edema.  Jettie Booze., MD  06/26/2014  11:35 AM

## 2014-06-26 NOTE — Progress Notes (Addendum)
PULMONARY / CRITICAL CARE MEDICINE   Name: [name] MRN: [id number] DOB: [birth year]   PCP Beacher May, MD   ADMISSION DATE:  06/24/2014  REFERRING MD :  Dr. Wyvonnia Dusky  PRIMARY SERVICE: PCCM   CHIEF COMPLAINT:  Acute respiratory failure  BRIEF PATIENT DESCRIPTION: 78 y/o M, current smoker, with PMH of HTN, CAD s/p MI, PVD & bipolar disorder who presented to [facility] ER on 6/28 with hypercarbic respiratory failure & hypertensive emergency.   SIGNIFICANT EVENTS / STUDIES:  6/28 - Admit with hypercarbic respiratory failure / hypertensive emergency 6/28 - Echo - 50% to 55%, hypokinesis of the inferior myocardium. Doppler parameters are consistent with a reversible restrictive pattern, grade 3 diastolic  8/75 - Extubated  LINES / TUBES: OETT 6/28 >>> 6/29 R IJ CVL 6/29 >>>  CULTURES: Sputum 6/28 >>> mod gram + cocci in pairs, non-path oral flora Resp virus 6/28 >>> Urine 6/28 >>> neg Blood culture 6/28 >>> ngtd >>> Urine Tox 6/28 >>> + THC  ANTIBIOTICS: Doxy 06/24/14 >>>6/30  SUBJECTIVE: no distress off vent  VITAL SIGNS: Temp:  [97.4 F (36.3 C)-98.3 F (36.8 C)] 97.5 F (36.4 C) (06/30 1300) Pulse Rate:  [76-104] 98 (06/30 1300) Resp:  [14-39] 23 (06/30 1300) BP: (107-156)/(44-100) 122/66 mmHg (06/30 1300) SpO2:  [91 %-100 %] 94 % (06/30 1408) FiO2 (%):  [40 %] 40 % (06/29 1600) Weight:  [101.2 kg (223 lb 1.7 oz)] 101.2 kg (223 lb 1.7 oz) (06/30 0500)  HEMODYNAMICS: CVP:  [0 mmHg-11 mmHg] 4 mmHg  VENTILATOR SETTINGS: Vent Mode:  [-]  FiO2 (%):  [40 %] 40 %  INTAKE / OUTPUT: Intake/Output     06/29 0701 - 06/30 0700 06/30 0701 - 07/01 0700   I.V. (mL/kg) 484.2 (4.8) 128.7 (1.3)   IV Piggyback 750    Total Intake(mL/kg) 1234.2 (12.2) 128.7 (1.3)   Urine (mL/kg/hr) 1495 (0.6) 475 (0.6)   Total Output 1495 475   Net -260.9 -346.3        Stool Occurrence 2 x     PHYSICAL EXAMINATION: General: Well-developed AA man, sitting up in bed, NAD Neuro: Awake,  alert, pleasant and cooperative with exam. Answering questions appropriately, no speech abnormalities, equal strength bilaterally.  HEENT: PERRL, oral mucosa pink and moist, slight JVD. R IJ CVL in place. PULM: Resps even and unlabored,  CV: s1 s2 irr no r GI: soft, bs wnl , no r/g Extremities: edema lowers  LABS: PULMONARY  Recent Labs Lab 06/24/14 1100 06/24/14 1114 06/24/14 1550 06/25/14 0325 06/25/14 1017  PHART 7.194*  --  7.308* 7.396 7.416  PCO2ART 75.8*  --  56.7* 48.0* 44.7  PO2ART 182.0*  --  74.0* 71.7* 68.0*  HCO3 29.3*  --  28.3* 28.9* 28.9*  TCO2 32 32 30 30.3 30  O2SAT 99.0  --  93.0 94.6 94.0   CBC  Recent Labs Lab 06/24/14 1532 06/25/14 0205 06/26/14 0113  HGB 14.6 13.0 13.5  HCT 43.5 39.6 41.2  WBC 7.3 7.9 13.5*  PLT 203 200 209   COAGULATION  Recent Labs Lab 06/24/14 1051 06/24/14 1532  INR 1.08 1.08   CARDIAC  Recent Labs Lab 06/24/14 1051 06/24/14 1532 06/24/14 2220 06/25/14 0638  TROPONINI <0.30 0.32* 2.52* 1.23*    Recent Labs Lab 06/24/14 1051 06/24/14 1532  PROBNP 4355.0* 5446.0*   CHEMISTRY  Recent Labs Lab 06/24/14 1051 06/24/14 1114 06/24/14 1532 06/25/14 0205 06/25/14 1045 06/26/14 0113  NA 138 141 139 138  --  134*  K 5.0 4.7 5.1 4.6  --  4.4  CL 96 97 98 98  --  96  CO2 25  --  24 28  --  26  GLUCOSE 264* 268* 253* 190*  --  383*  BUN 12 13 15 20   --  35*  CREATININE 1.26 1.50* 1.28 1.77*  --  1.99*  CALCIUM 9.1  --  8.9 8.8  --  8.3*  MG  --   --  1.4*  --  1.3*  --   PHOS  --   --   --   --  3.0  --    Estimated Creatinine Clearance: 39.5 ml/min (by C-G formula based on Cr of 1.99).  LIVER  Recent Labs Lab 06/24/14 1051 06/24/14 1532 06/26/14 0113  AST 46* 59* 23  ALT 26 34 21  ALKPHOS 52 49 43  BILITOT 0.6 0.6 0.4  PROT 8.5* 7.7 6.3  ALBUMIN 3.5 3.2* 2.5*  INR 1.08 1.08  --    INFECTIOUS  Recent Labs Lab 06/24/14 1115 06/24/14 1532 06/25/14 0205 06/26/14 0113  LATICACIDVEN  3.56* 3.3*  --   --   PROCALCITON  --  0.16 0.65 0.45   ENDOCRINE CBG (last 3)   Recent Labs  06/26/14 0358 06/26/14 0800 06/26/14 1240  GLUCAP 271* 212* 328*   IMAGING x48h  Dg Chest Port 1 View  06/26/2014   CLINICAL DATA:  Followup edema  EXAM: PORTABLE CHEST - 1 VIEW  COMPARISON:  06/25/2014  FINDINGS: Endotracheal tube removed.  Central venous catheter tip in the SVC.  Interval improvement in pulmonary edema. Improvement in bibasilar atelectasis which remains. Small right effusion has improved.  IMPRESSION: Significant improvement in pulmonary edema. Mild bibasilar atelectasis remains.  Endotracheal tube removed.   Electronically Signed   By: Franchot Gallo M.D.   On: 06/26/2014 07:26   Dg Chest Port 1 View  06/25/2014   CLINICAL DATA:  Central line placement.  EXAM: PORTABLE CHEST - 1 VIEW  COMPARISON:  06/25/2014.  FINDINGS: The endotracheal tube and NG tubes are stable. There is a new right IJ central venous catheter with its tip at the level of the carina in the region of the mid SVC. The heart and lungs are stable.  IMPRESSION: Right IJ central venous catheter in good position with its tip in the mid SVC. No complicating features such as pneumothorax.  Stable mild cardiac enlargement, pulmonary vascular congestion and possible mild edema.   Electronically Signed   By: Kalman Jewels M.D.   On: 06/25/2014 12:27   Dg Chest Port 1 View  06/25/2014   CLINICAL DATA:  Evaluate endotracheal tube, airspace disease  EXAM: PORTABLE CHEST - 1 VIEW  COMPARISON:  06/24/2014; 11/29/2012; 07/09/2011  FINDINGS: Grossly unchanged enlarged cardiac silhouette and mediastinal contours given persistently reduced lung volumes and patient rotation. Atherosclerotic plaque within the thoracic aorta. Stable position of support apparatus. No pneumothorax. Worsening bilateral mid and lower lung heterogeneous opacities, right greater than left. Pulmonary vasculature remains indistinct with cephalization of flow.  Trace bilateral effusions are not excluded, right greater than left. Grossly unchanged bones.  IMPRESSION: 1.  Stable positioning of support apparatus.  No pneumothorax. 2. Findings most suggestive of worsening pulmonary edema and bibasilar opacities, atelectasis versus infiltrate.   Electronically Signed   By: Sandi Mariscal M.D.   On: 06/25/2014 07:55   Dg Abd Portable 1v  06/24/2014   CLINICAL DATA:  OG tube placement.  EXAM: PORTABLE ABDOMEN - 1 VIEW  COMPARISON:  None.  FINDINGS: The OG tube tip is in the distal stomach. No abnormal dilated loops of bowel. No suspicious radiopaque foreign bodies are soft tissue calcifications. Calcified atherosclerotic disease involves the abdominal aorta and its branches.  IMPRESSION: 1. OG tube tip is in satisfactory position within the distal stomach.   Electronically Signed   By: Kerby Moors M.D.   On: 06/24/2014 20:59   ASSESSMENT / PLAN:  PULMONARY A: Acute resp failure - edema? Baseline 50 pack smoker: with COPD NOS, Several months of chronic cough CHF NOS/acute pulm edema acute v AECOPD P:   - Supplemental O2 for O2 sats > 93% - Solumedrol 40mg  IV Q D, reduced - Pulmonary hygiene - CXR with improvement in pulm edema,  CARDIOVASCULAR A:  Atrial Fibrillation  Hypertensive Emergency - initial MAP 114 CAD s/p MI + Stents - c/o [facility] PVD  Diastolic heart failure, acute R/o MI, stress ischemia, NSTEMI P:  - Cards following - Need for reevaluation of ischemic etiology post-extubation, poss cardiac cath - Continue home ASA, lipitor, cardizem, pletal - Carvedilol at lower dose w/ pulm edema/CHF, rec diuresis - Heparin gtt, consider coumadin for long-term anticoagulation -cvp low, this is not c/w edema cardiogenic, see renal  RENAL A:   Hx of prostate cancer - 2003 Acute on Chronic Renal Insufficiency : failed Coude and Foley in ER 06/24/2014 ARF, ATN? P:   - BMet as needed - Inc in BUN/Cr, hold Lasix, continue to hold 6/30. - cvp 4  as well - Flomax  GASTROINTESTINAL A:   Ventilator Associated Dysphagia, resolved P:   - Carb-Modified Diet - PPI  HEMATOLOGIC A:   DVT Prev Leukocytosis - receiving steroids P:  - Heparin gtt covers DVT prophylaxis -cbc in am per hep protocol pharmacy  INFECTIOUS A:   AECOPD P:   - 5 days doxycycline, clinically progressed fast, pct neg , dc  ENDOCRINE A:   Diabetes Mellitus  CBG (last 3)   Recent Labs  06/26/14 0358 06/26/14 0800 06/26/14 1240  GLUCAP 271* 212* 328*     P:   - Lantus increased to 30 u  Qd 6/30. - SSI -decrease solumedrol 6/30  NEUROLOGIC A:   Daily bourbon intake per daughter. At risk for EtOH withdrawal Acute Encephalopathy - in setting of hypercarbic respiratory failure P:   - RASS Goal: 0 - Thiamine   Global: Tx to tele floor and to triad service  Lowella Dell. Servando Salina Minor ACNP Maryanna Shape PCCM Pager [phone] till 3 pm If no answer page [phone] 06/26/2014, 2:50 PM  I have fully examined this patient and agree with above findings.     Lavon Paganini. Titus Mould, MD, [place] Pgr: [facility]

## 2014-06-27 ENCOUNTER — Inpatient Hospital Stay (HOSPITAL_COMMUNITY): Payer: PRIVATE HEALTH INSURANCE

## 2014-06-27 ENCOUNTER — Encounter (HOSPITAL_COMMUNITY): Payer: Self-pay | Admitting: General Practice

## 2014-06-27 DIAGNOSIS — I5033 Acute on chronic diastolic (congestive) heart failure: Secondary | ICD-10-CM

## 2014-06-27 DIAGNOSIS — I214 Non-ST elevation (NSTEMI) myocardial infarction: Secondary | ICD-10-CM

## 2014-06-27 DIAGNOSIS — I10 Essential (primary) hypertension: Secondary | ICD-10-CM

## 2014-06-27 DIAGNOSIS — Z720 Tobacco use: Secondary | ICD-10-CM | POA: Diagnosis present

## 2014-06-27 DIAGNOSIS — I4891 Unspecified atrial fibrillation: Secondary | ICD-10-CM

## 2014-06-27 DIAGNOSIS — I5032 Chronic diastolic (congestive) heart failure: Secondary | ICD-10-CM | POA: Diagnosis present

## 2014-06-27 LAB — BASIC METABOLIC PANEL
Anion gap: 11 (ref 5–15)
BUN: 40 mg/dL — AB (ref 6–23)
BUN: 43 mg/dL — AB (ref 6–23)
CO2: 26 mEq/L (ref 19–32)
CO2: 27 mEq/L (ref 19–32)
CREATININE: 1.63 mg/dL — AB (ref 0.50–1.35)
Calcium: 8.7 mg/dL (ref 8.4–10.5)
Calcium: 8.8 mg/dL (ref 8.4–10.5)
Chloride: 94 mEq/L — ABNORMAL LOW (ref 96–112)
Chloride: 96 mEq/L (ref 96–112)
Creatinine, Ser: 1.78 mg/dL — ABNORMAL HIGH (ref 0.50–1.35)
GFR calc Af Amer: 41 mL/min — ABNORMAL LOW (ref >90)
GFR, EST AFRICAN AMERICAN: 46 mL/min — AB (ref >90)
GFR, EST NON AFRICAN AMERICAN: 36 mL/min — AB (ref >90)
GFR, EST NON AFRICAN AMERICAN: 40 mL/min — AB (ref >90)
GLUCOSE: 265 mg/dL — AB (ref 70–99)
GLUCOSE: 405 mg/dL — AB (ref 70–99)
POTASSIUM: 4.9 meq/L (ref 3.7–5.3)
Potassium: 4 mEq/L (ref 3.7–5.3)
SODIUM: 132 meq/L — AB (ref 137–147)
Sodium: 134 mEq/L — ABNORMAL LOW (ref 137–147)

## 2014-06-27 LAB — GLUCOSE, CAPILLARY
GLUCOSE-CAPILLARY: 160 mg/dL — AB (ref 70–99)
GLUCOSE-CAPILLARY: 259 mg/dL — AB (ref 70–99)
GLUCOSE-CAPILLARY: 279 mg/dL — AB (ref 70–99)
GLUCOSE-CAPILLARY: 332 mg/dL — AB (ref 70–99)
GLUCOSE-CAPILLARY: 336 mg/dL — AB (ref 70–99)
GLUCOSE-CAPILLARY: 356 mg/dL — AB (ref 70–99)
Glucose-Capillary: 226 mg/dL — ABNORMAL HIGH (ref 70–99)

## 2014-06-27 LAB — CBC
HEMATOCRIT: 40.1 % (ref 39.0–52.0)
Hemoglobin: 13.4 g/dL (ref 13.0–17.0)
MCH: 29.6 pg (ref 26.0–34.0)
MCHC: 33.4 g/dL (ref 30.0–36.0)
MCV: 88.7 fL (ref 78.0–100.0)
Platelets: 209 10*3/uL (ref 150–400)
RBC: 4.52 MIL/uL (ref 4.22–5.81)
RDW: 14.9 % (ref 11.5–15.5)
WBC: 14 10*3/uL — ABNORMAL HIGH (ref 4.0–10.5)

## 2014-06-27 LAB — MAGNESIUM: Magnesium: 2.1 mg/dL (ref 1.5–2.5)

## 2014-06-27 LAB — CULTURE, RESPIRATORY: SPECIAL REQUESTS: NORMAL

## 2014-06-27 LAB — CULTURE, RESPIRATORY W GRAM STAIN

## 2014-06-27 LAB — PHOSPHORUS: PHOSPHORUS: 2.7 mg/dL (ref 2.3–4.6)

## 2014-06-27 LAB — HEPARIN LEVEL (UNFRACTIONATED): Heparin Unfractionated: 0.52 IU/mL (ref 0.30–0.70)

## 2014-06-27 MED ORDER — DILTIAZEM HCL ER COATED BEADS 120 MG PO TB24
120.0000 mg | ORAL_TABLET | Freq: Every day | ORAL | Status: AC
  Administered 2014-06-27: 120 mg via ORAL
  Filled 2014-06-27: qty 1

## 2014-06-27 MED ORDER — PANTOPRAZOLE SODIUM 40 MG PO TBEC
40.0000 mg | DELAYED_RELEASE_TABLET | Freq: Every day | ORAL | Status: DC
  Administered 2014-06-27 – 2014-06-28 (×2): 40 mg via ORAL
  Filled 2014-06-27 (×3): qty 1

## 2014-06-27 MED ORDER — ASPIRIN 300 MG RE SUPP
300.0000 mg | Freq: Every day | RECTAL | Status: DC
  Filled 2014-06-27 (×2): qty 1

## 2014-06-27 MED ORDER — HEPARIN SODIUM (PORCINE) 5000 UNIT/ML IJ SOLN
5000.0000 [IU] | Freq: Three times a day (TID) | INTRAMUSCULAR | Status: DC
  Administered 2014-06-27 – 2014-06-29 (×4): 5000 [IU] via SUBCUTANEOUS
  Filled 2014-06-27 (×7): qty 1

## 2014-06-27 MED ORDER — ASPIRIN 81 MG PO CHEW
81.0000 mg | CHEWABLE_TABLET | Freq: Every day | ORAL | Status: DC
  Administered 2014-06-28 – 2014-06-29 (×2): 81 mg via ORAL
  Filled 2014-06-27 (×2): qty 1

## 2014-06-27 MED ORDER — DILTIAZEM HCL ER COATED BEADS 240 MG PO CP24
240.0000 mg | ORAL_CAPSULE | Freq: Every day | ORAL | Status: DC
  Administered 2014-06-28 – 2014-06-29 (×2): 240 mg via ORAL
  Filled 2014-06-27 (×2): qty 1

## 2014-06-27 MED ORDER — INSULIN GLARGINE 100 UNIT/ML ~~LOC~~ SOLN
35.0000 [IU] | Freq: Every day | SUBCUTANEOUS | Status: DC
  Administered 2014-06-27: 35 [IU] via SUBCUTANEOUS
  Filled 2014-06-27 (×2): qty 0.35

## 2014-06-27 MED ORDER — PREDNISONE 20 MG PO TABS
40.0000 mg | ORAL_TABLET | Freq: Every day | ORAL | Status: DC
  Administered 2014-06-27 – 2014-06-29 (×3): 40 mg via ORAL
  Filled 2014-06-27 (×5): qty 2

## 2014-06-27 MED ORDER — DILTIAZEM HCL ER COATED BEADS 120 MG PO TB24
120.0000 mg | ORAL_TABLET | Freq: Every day | ORAL | Status: DC
  Administered 2014-06-27: 120 mg via ORAL
  Filled 2014-06-27: qty 1

## 2014-06-27 MED ORDER — INSULIN ASPART 100 UNIT/ML ~~LOC~~ SOLN
0.0000 [IU] | Freq: Three times a day (TID) | SUBCUTANEOUS | Status: DC
  Administered 2014-06-27: 15 [IU] via SUBCUTANEOUS
  Administered 2014-06-28: 3 [IU] via SUBCUTANEOUS
  Administered 2014-06-28: 15 [IU] via SUBCUTANEOUS
  Administered 2014-06-28: 20 [IU] via SUBCUTANEOUS
  Administered 2014-06-29: 3 [IU] via SUBCUTANEOUS
  Administered 2014-06-29: 7 [IU] via SUBCUTANEOUS

## 2014-06-27 NOTE — Progress Notes (Signed)
[facility] Progress Note   [name] GGY:[id number] DOB: 1936-03-15 DOA: 06/24/2014 PCP: Beacher May, MD  Brief narrative: [name] is a 78 y.o. male  presents with a hypertensive emergency, acute resp failure requiring intubation. Admits to chronic pedal edema for which he takes Furosemide. PCP is Dr Mellody Drown and the [place] and he sees them regularly.    Subjective: Mild dry cough this AM- otherwise doing well. No dyspnea. Eating well, Bm yesterday. Is not aware that he needs to take a low sodium diet. Does not know that he has CHF. He is a smoker but has never been told whether he has COPD or emphysema.  He was leaving AMA today after speaking with Dr Harrington Challenger. I have spoken to him and explained his medical problems in detail. He is willing to stay another 24 hrs until we can make a proper medical plan for him.   Assessment/Plan: Principal Problem:   Acute respiratory failure - suspect COPD exacerbation although he has no official diagnosis - Per ICU notes, pt was wheezing and pulm wedge pressure was not elevated on admission therefore less likely to be CHF  (A) COPD??- taper steroid to oral prednisone today and continue for 5 more days and stop  (B) CHF-acute on chronic diastolic grade 3 - currently on room air  - will set him up with CHF [facility] - Dr Harrington Challenger to advise on whether he needs Lasix and how much he will need on d/c - cont CCB and BB  ECHO- 06/24/14  - Left ventricle: The cavity size was normal. There was moderate concentric hypertrophy. Systolic function was normal. The estimated ejection fraction was in the range of 50% to 55%. Hypokinesis of the inferior myocardium. Doppler parameters are consistent with a reversible restrictive pattern, indicative of decreased left ventricular diastolic compliance and/or increased left atrial pressure (grade 3 diastolic dysfunction). - Left atrium: The atrium was moderately dilated.   Active  Problems:  NSTEMI -cardiology suspects demand ischemia- OK with cards to stop Heparin    Atrial fibrillation- chronic - rate controlled -noted that he is not on anticoagulation per med rec- - cardiology to advise on anticoagulation-     Tobacco abuse - has been advised by Dr Burt Knack to quit many times but has not    DM - increase Lantus to 35 U as sugar slightly elevated still - note that he is not on short acting insulin at home    HYPERTENSION- with HTN urgency on admission - cont CCB and BB    CAD/ PVD - cont ASA and pletal  BPH - cont Flomax and proscar   Code Status: full code  Family Communication: none Disposition Plan: home  Consultants: cardio  Procedures: OETT 6/28 >>> 6/29  R IJ CVL 6/29 >>>  CULTURES:  Sputum 6/28 >>> mod gram + cocci in pairs, non-path oral flora  Resp virus 6/28 >>>  Urine 6/28 >>> neg  Blood culture 6/28 >>> ngtd >>>  Urine Tox 6/28 >>> + THC      Antibiotics: Anti-infectives   Start     Dose/Rate Route Frequency Ordered Stop   06/24/14 1500  doxycycline (VIBRAMYCIN) 100 mg in dextrose 5 % 250 mL IVPB  Status:  Discontinued     100 mg 125 mL/hr over 120 Minutes Intravenous Every 12 hours 06/24/14 1440 06/26/14 1505   06/24/14 1415  doxycycline (VIBRA-TABS) tablet 100 mg  Status:  Discontinued     100 mg Oral Every 12 hours 06/24/14  1407 06/24/14 1440       DVT prophylaxis: Start s/c Heparin- has been on heparin infusion which will be d/c'd now  Objective: Filed Weights   06/26/14 0500 06/26/14 1807 06/27/14 0522  Weight: 101.2 kg (223 lb 1.7 oz) 103.1 kg (227 lb 4.7 oz) 105.416 kg (232 lb 6.4 oz)    Vitals Filed Vitals:   06/27/14 0522 06/27/14 0905 06/27/14 1002 06/27/14 1123  BP: 150/68 147/71 132/65 157/76  Pulse: 101 92 99 111  Temp: 97.5 F (36.4 C)     TempSrc: Oral     Resp: 18     Height:      Weight: 105.416 kg (232 lb 6.4 oz)     SpO2: 93%         Intake/Output Summary (Last 24 hours) at  06/27/14 1001 Last data filed at 06/27/14 0519  Gross per 24 hour  Intake 696.74 ml  Output   1125 ml  Net -428.26 ml     Exam: General: No acute respiratory distress Lungs: Clear to auscultation bilaterally without wheezes or crackles- on room air Cardiovascular: Regular rate and rhythm without murmur gallop or rub normal S1 and S2 Abdomen: Nontender, nondistended, soft, bowel sounds positive, no rebound, no ascites, no appreciable mass Extremities: No significant cyanosis, clubbing, or edema bilateral lower extremities  Data Reviewed: Basic Metabolic Panel:  Recent Labs Lab 06/24/14 1051 06/24/14 1114 06/24/14 1532 06/25/14 0205 06/25/14 1045 06/26/14 0113 06/27/14 0250  NA 138 141 139 138  --  134* 132*  K 5.0 4.7 5.1 4.6  --  4.4 4.0  CL 96 97 98 98  --  96 94*  CO2 25  --  24 28  --  26 26  GLUCOSE 264* 268* 253* 190*  --  383* 265*  BUN 12 13 15 20   --  35* 40*  CREATININE 1.26 1.50* 1.28 1.77*  --  1.99* 1.78*  CALCIUM 9.1  --  8.9 8.8  --  8.3* 8.7  MG  --   --  1.4*  --  1.3*  --  2.1  PHOS  --   --   --   --  3.0  --  2.7   Liver Function Tests:  Recent Labs Lab 06/24/14 1051 06/24/14 1532 06/26/14 0113  AST 46* 59* 23  ALT 26 34 21  ALKPHOS 52 49 43  BILITOT 0.6 0.6 0.4  PROT 8.5* 7.7 6.3  ALBUMIN 3.5 3.2* 2.5*   No results found for this basename: LIPASE, AMYLASE,  in the last 168 hours No results found for this basename: AMMONIA,  in the last 168 hours CBC:  Recent Labs Lab 06/24/14 1051 06/24/14 1114 06/24/14 1532 06/25/14 0205 06/26/14 0113 06/27/14 0250  WBC 9.4  --  7.3 7.9 13.5* 14.0*  NEUTROABS 4.4  --  6.6  --  12.4*  --   HGB 15.6 19.0* 14.6 13.0 13.5 13.4  HCT 47.9 56.0* 43.5 39.6 41.2 40.1  MCV 93.0  --  91.2 89.8 88.6 88.7  PLT 232  --  203 200 209 209   Cardiac Enzymes:  Recent Labs Lab 06/24/14 1051 06/24/14 1532 06/24/14 2220 06/25/14 0638  CKTOTAL  --  150  --   --   CKMB  --  5.3*  --   --   TROPONINI <0.30  0.32* 2.52* 1.23*   BNP (last 3 results)  Recent Labs  06/24/14 1051 06/24/14 1532  PROBNP 4355.0* 5446.0*   CBG:  Recent  Labs Lab 06/26/14 1552 06/26/14 2006 06/27/14 0024 06/27/14 0516 06/27/14 0754  GLUCAP 312* 245* 279* 226* 160*    Recent Results (from the past 240 hour(s))  MRSA PCR SCREENING     Status: None   Collection Time    06/24/14  2:18 PM      Result Value Ref Range Status   MRSA by PCR NEGATIVE  NEGATIVE Final   Comment:            The GeneXpert MRSA Assay (FDA     approved for NASAL specimens     only), is one component of a     comprehensive MRSA colonization     surveillance program. It is not     intended to diagnose MRSA     infection nor to guide or     monitor treatment for     MRSA infections.  CULTURE, BLOOD (ROUTINE X 2)     Status: None   Collection Time    06/24/14  3:17 PM      Result Value Ref Range Status   Specimen Description BLOOD LEFT ARM   Final   Special Requests BOTTLES DRAWN AEROBIC AND ANAEROBIC 10CC   Final   Culture  Setup Time     Final   Value: 06/24/2014 22:50     Performed at [facility]   Culture     Final   Value:        BLOOD CULTURE RECEIVED NO GROWTH TO DATE CULTURE WILL BE HELD FOR 5 DAYS BEFORE ISSUING A FINAL NEGATIVE REPORT     Performed at [facility]   Report Status PENDING   Incomplete  CULTURE, RESPIRATORY (NON-EXPECTORATED)     Status: None   Collection Time    06/24/14  3:35 PM      Result Value Ref Range Status   Specimen Description TRACHEAL ASPIRATE   Final   Special Requests Normal   Final   Gram Stain     Final   Value: RARE WBC PRESENT, PREDOMINANTLY MONONUCLEAR     NO SQUAMOUS EPITHELIAL CELLS SEEN     MODERATE GRAM POSITIVE COCCI IN PAIRS     Performed at [facility]   Culture     Final   Value: Non-Pathogenic Oropharyngeal-type Flora Isolated.     Performed at [facility]   Report Status 06/27/2014 FINAL   Final  URINE CULTURE     Status: None    Collection Time    06/24/14  5:16 PM      Result Value Ref Range Status   Specimen Description URINE, CATHETERIZED   Final   Special Requests Immunocompromised   Final   Culture  Setup Time     Final   Value: 06/25/2014 02:14     Performed at [facility]     Final   Value: NO GROWTH     Performed at [facility]   Culture     Final   Value: NO GROWTH     Performed at [facility]   Report Status 06/26/2014 FINAL   Final  CULTURE, BLOOD (ROUTINE X 2)     Status: None   Collection Time    06/24/14  5:31 PM      Result Value Ref Range Status   Specimen Description BLOOD RIGHT HAND   Final   Special Requests BOTTLES DRAWN AEROBIC ONLY [place]   Final   Culture  Setup Time     Final   Value: 06/25/2014 01:49     Performed at [facility]   Culture     Final   Value:        BLOOD CULTURE RECEIVED NO GROWTH TO DATE CULTURE WILL BE HELD FOR 5 DAYS BEFORE ISSUING A FINAL NEGATIVE REPORT     Performed at [facility]   Report Status PENDING   Incomplete     Studies:  Recent x-ray studies have been reviewed in detail by the Attending Physician  Scheduled Meds:  Scheduled Meds: . antiseptic oral rinse  15 mL Mouth Rinse QID  . aspirin  324 mg Oral Daily   Or  . aspirin  300 mg Rectal Daily  . atorvastatin  40 mg Oral q1800  . carvedilol  12.5 mg Oral BID WC  . chlorhexidine  15 mL Mouth Rinse BID  . cilostazol  50 mg Oral BID  . diltiazem  120 mg Oral Daily  . fentaNYL  50 mcg Intravenous Once  . finasteride  5 mg Oral Daily  . gabapentin  300 mg Oral TID  . insulin aspart  0-20 Units Subcutaneous TID WC  . insulin glargine  30 Units Subcutaneous QHS  . latanoprost  1 drop Both Eyes QHS  . pantoprazole (PROTONIX) IV  40 mg Intravenous QHS  . predniSONE  40 mg Oral Q breakfast  . tamsulosin  0.4 mg Oral Daily   Continuous Infusions: . heparin 1,300 Units/hr (06/26/14 2254)    Time spent on care of this patient: > 45  min   [place], MD 06/27/2014, 10:01 AM  LOS: 3 days   [facility] Office  [phone] Pager - Text Page per Shea Evans   If 7PM-7AM, please contact night-coverage [email or web address]

## 2014-06-27 NOTE — Progress Notes (Signed)
Patient requested to leave AMA, yelling and screaming, also demanding to speak to someone in charge. AMA documentation was signed, IV and Tele box was removed, cardiologist and attending MD was notified. It was explained to the patient and encouraged not to leave against medical advice, but it was also made clear that it was his choice.

## 2014-06-27 NOTE — Progress Notes (Signed)
Medicare Important Message given. [name] [name], [place], [place], [employer] [phone].

## 2014-06-27 NOTE — Evaluation (Addendum)
Physical Therapy Evaluation Patient Details Name: [name] MRN: [id number] DOB: May 07, 1936 Today's Date: 06/27/2014   History of Present Illness  78 y/o M, current smoker, with PMH of HTN, CAD s/p MI, PVD & bipolar disorder who presented to [facility] ER on 6/28 with hypercarbic respiratory failure & hypertensive emergency.  Pt intubated and then extubated 06/26/14.   Clinical Impression  Pt adm from home due to the above. Pt presents with antalgic gt due to pain in Rt knee. Pt with generalized weakness and overall deconditioning at this time. Pt is min guard to supervision from mobility standpoint. Encouraged pt to ambulate with RW upon acute D/C to increase stabilization and reduce risk of falls. If pt does not D/C from medical standpoint; he will benefit from further skilled acute PT services to increase stability and independence with mobility. Pt agreeable to ambulate with RW upon D/C home.     Follow Up Recommendations Home health PT;Supervision/Assistance - 24 hour    Equipment Recommendations  None recommended by PT    Recommendations for Other Services OT consult     Precautions / Restrictions Precautions Precautions: None Restrictions Weight Bearing Restrictions: No      Mobility  Bed Mobility               General bed mobility comments: not addressed; pt up in chair and returned to recliner   Transfers Overall transfer level: Modified independent Equipment used: None             General transfer comment: no sway or LOB noted with transfers   Ambulation/Gait Ambulation/Gait assistance: Supervision;Min guard Ambulation Distance (Feet): 100 Feet Assistive device: None Gait Pattern/deviations: Decreased stance time - right;Step-through pattern;Decreased stride length;Decreased step length - left;Wide base of support;Antalgic Gait velocity: decreased; reports its his baseline  Gait velocity interpretation: Below normal speed for age/gender General Gait  Details: pt reaching for UE support near end of ambulation due to fatigue; c/o Rt knee pain attributing to antalgic gt; encouraged to ambulate with RW upon acute D/C to increase stability. Min guard at times due to antalgic gt and sway  Stairs            Wheelchair Mobility    Modified Rankin (Stroke Patients Only)       Balance Overall balance assessment: No apparent balance deficits (not formally assessed)                           High level balance activites: Direction changes;Head turns;Turns High Level Balance Comments: no LOB or balance deficits noted; pt slightly unsteady due to pain in Rt knee             Pertinent Vitals/Pain 95% on RA at rest; 96% during ambulation    Home Living Family/patient expects to be discharged to:: Private residence Living Arrangements: Alone Available Help at Discharge: Personal care attendant;Available PRN/intermittently (2.5 hours) Type of Home: Apartment Home Access: Level entry     Home Layout: One level Home Equipment: Walker - 4 wheels;Cane - single point;Grab bars - tub/shower;Grab bars - toilet Additional Comments: pt has tub shower    Prior Function Level of Independence: Needs assistance   Gait / Transfers Assistance Needed: ambulates with cane PRN  ADL's / Homemaking Assistance Needed: has aide to help with bathing and dressing as needed  Comments: pt has aide 7days a week     Hand Dominance        Extremity/Trunk Assessment  Upper Extremity Assessment: Defer to OT evaluation           Lower Extremity Assessment: Generalized weakness      Cervical / Trunk Assessment: Kyphotic  Communication   Communication: No difficulties  Cognition Arousal/Alertness: Awake/alert Behavior During Therapy: WFL for tasks assessed/performed Overall Cognitive Status: Within Functional Limits for tasks assessed                      General Comments      Exercises        Assessment/Plan     PT Assessment Patient needs continued PT services (if not medically ready to D/C today)  PT Diagnosis Abnormality of gait;Generalized weakness;Acute pain   PT Problem List Decreased mobility;Decreased activity tolerance;Decreased balance;Pain  PT Treatment Interventions DME instruction;Gait training;Therapeutic activities;Therapeutic exercise;Functional mobility training;Balance training;Neuromuscular re-education;Patient/family education   PT Goals (Current goals can be found in the Care Plan section) Acute Rehab PT Goals Patient Stated Goal: to go home today PT Goal Formulation: With patient Time For Goal Achievement: 07/01/14 Potential to Achieve Goals: Good    Frequency Min 3X/week   Barriers to discharge        Co-evaluation               End of Session Equipment Utilized During Treatment: Gait belt Activity Tolerance: Patient tolerated treatment well Patient left: in chair;with call bell/phone within reach Nurse Communication: Mobility status         Time: 1111-1131 PT Time Calculation (min): 20 min   Charges:   PT Evaluation $Initial PT Evaluation Tier I: 1 Procedure PT Treatments $Gait Training: 8-22 mins   PT G CodesGustavus Bryant, [place]  [phone] 06/27/2014, 2:40 PM

## 2014-06-27 NOTE — Progress Notes (Signed)
Report given to receiving RN. Patient in bed resting. No verbal complaints and no signs or symptoms of distress or discomfort. Family at bedside.

## 2014-06-27 NOTE — Progress Notes (Addendum)
The patient did not receive any PRN medications and did not have any complaints overnight.  His blood sugars stayed in the 200s overnight and his VS are stable.  The IV team nurse removed the patient's IJ and his heparin drip ran without complication.  He was given both a heart failure and a COPD education packet.

## 2014-06-27 NOTE — Discharge Instructions (Signed)
Heart Failure Heart failure means your heart has trouble pumping blood. This makes it hard for your body to work well. Heart failure is usually a long-term (chronic) condition. You must take good care of yourself and follow your doctor's treatment plan. HOME CARE  Take your heart medicine as told by your doctor.  Do not stop taking medicine unless your doctor tells you to.  Do not skip any dose of medicine.  Refill your medicines before they run out.  Take other medicines only as told by your doctor or pharmacist.  Stay active if told by your doctor. The elderly and people with severe heart failure should talk with a doctor about physical activity.  Eat heart healthy foods. Choose foods that are without trans fat and are low in saturated fat, cholesterol, and salt (sodium). This includes fresh or frozen fruits and vegetables, fish, lean meats, fat-free or low-fat dairy foods, whole grains, and high-fiber foods. Lentils and dried peas and beans (legumes) are also good choices.  Limit salt if told by your doctor.  Cook in a healthy way. Roast, grill, broil, bake, poach, steam, or stir-fry foods.  Limit fluids as told by your doctor.  Weigh yourself every morning. Do this after you pee (urinate) and before you eat breakfast. Write down your weight to give to your doctor.  Take your blood pressure and write it down if your doctor tell you to.  Ask your doctor how to check your pulse. Check your pulse as told.  Lose weight if told by your doctor.  Stop smoking or chewing tobacco. Do not use gum or patches that help you quit without your doctor's approval.  Schedule and go to doctor visits as told.  Nonpregnant women should have no more than 1 drink a day. Men should have no more than 2 drinks a day. Talk to your doctor about drinking alcohol.  Stop illegal drug use.  Stay current with shots (immunizations).  Manage your health conditions as told by your doctor.  Learn to manage  your stress.  Rest when you are tired.  If it is really hot outside:  Avoid intense activities.  Use air conditioning or fans, or get in a cooler place.  Avoid caffeine and alcohol.  Wear loose-fitting, lightweight, and light-colored clothing.  If it is really cold outside:  Avoid intense activities.  Layer your clothing.  Wear mittens or gloves, a hat, and a scarf when going outside.  Avoid alcohol.  Learn about heart failure and get support as needed.  Get help to maintain or improve your quality of life and your ability to care for yourself as needed. GET HELP IF:   You gain 03 lb/1.4 kg or more in 1 day or 05 lb/2.3 kg in a week.  You are more short of breath than usual.  You cannot do your normal activities.  You tire easily.  You cough more than normal, especially with activity.  You have any or more puffiness (swelling) in areas such as your hands, feet, ankles, or belly (abdomen).  You cannot sleep because it is hard to breathe.  You feel like your heart is beating fast (palpitations).  You get dizzy or lightheaded when you stand up. GET HELP RIGHT AWAY IF:   You have trouble breathing.  There is a change in mental status, such as becoming less alert or not being able to focus.  You have chest pain or discomfort.  You faint. MAKE SURE YOU:   Understand these  instructions.  Will watch your condition.  Will get help right away if you are not doing well or get worse. Document Released: 09/22/2008 Document Revised: 04/10/2013 Document Reviewed: 07/14/2012 [facility] Patient Information 2015 [place], [place]. This information is not intended to replace advice given to you by your health care provider. Make sure you discuss any questions you have with your health care provider.

## 2014-06-27 NOTE — Progress Notes (Signed)
ANTICOAGULATION CONSULT NOTE - Follow up Consult  Pharmacy Consult:  Heparin  Indication: chest pain/ACS / Afib  Allergies  Allergen Reactions  . Penicillins Other (See Comments)     hands and feet edema    Labs:  Recent Labs  06/24/14 1532 06/24/14 2220 06/25/14 0205 06/25/14 0638  06/26/14 0113 06/26/14 1000 06/26/14 1953 06/27/14 0250  HGB 14.6  --  13.0  --   --  13.5  --   --  13.4  HCT 43.5  --  39.6  --   --  41.2  --   --  40.1  PLT 203  --  200  --   --  209  --   --  209  LABPROT 14.0  --   --   --   --   --   --   --   --   INR 1.08  --   --   --   --   --   --   --   --   HEPARINUNFRC  --   --   --   --   < > 0.14* 0.22* 0.62 0.52  CREATININE 1.28  --  1.77*  --   --  1.99*  --   --  1.78*  CKTOTAL 150  --   --   --   --   --   --   --   --   CKMB 5.3*  --   --   --   --   --   --   --   --   TROPONINI 0.32* 2.52*  --  1.23*  --   --   --   --   --   < > = values in this interval not displayed.  Estimated Creatinine Clearance: 45 ml/min (by C-G formula based on Cr of 1.78).    Assessment: 52 YOM started on IV heparin for ACS / Afib.   Heparin level therapeutic CBC stable Awaiting plan for long term anti-coag vs cath?  Goal of Therapy:  Heparin level 0.3-0.7 units/ml Monitor platelets by anticoagulation protocol: Yes   Plan: - Continue heparin infusion at 1300 units/hr - Daily HL / CBC  Thank you. Anette Guarneri, PharmD [phone]

## 2014-06-27 NOTE — Progress Notes (Signed)
Subjective: No CP  Breathing better.   Objective: Filed Vitals:   06/27/14 0522 06/27/14 0905 06/27/14 1002 06/27/14 1123  BP: 150/68 147/71 132/65 157/76  Pulse: 101 92 99 111  Temp: 97.5 F (36.4 C)     TempSrc: Oral     Resp: 18     Height:      Weight: 232 lb 6.4 oz (105.416 kg)     SpO2: 93%      Weight change: 4 lb 3 oz (1.9 kg)  Intake/Output Summary (Last 24 hours) at 06/27/14 1234 Last data filed at 06/27/14 0519  Gross per 24 hour  Intake 654.07 ml  Output    975 ml  Net -320.93 ml    General: Alert, awake, oriented x3, in no acute distress Neck:  JVP is normal Heart: Regular rate and rhythm, without murmurs, rubs, gallops.  Lungs: Clear to auscultation.  No rales or wheezes. Exemities:  1+ LLE edema.   Neuro: Grossly intact, nonfocal.  Tele:  Afib   Lab Results: Results for orders placed during the hospital encounter of 06/24/14 (from the past 24 hour(s))  GLUCOSE, CAPILLARY     Status: Abnormal   Collection Time    06/26/14 12:40 PM      Result Value Ref Range   Glucose-Capillary 328 (*) 70 - 99 mg/dL  GLUCOSE, CAPILLARY     Status: Abnormal   Collection Time    06/26/14  3:52 PM      Result Value Ref Range   Glucose-Capillary 312 (*) 70 - 99 mg/dL   Comment 1 Notify RN    HEPARIN LEVEL (UNFRACTIONATED)     Status: None   Collection Time    06/26/14  7:53 PM      Result Value Ref Range   Heparin Unfractionated 0.62  0.30 - 0.70 IU/mL  GLUCOSE, CAPILLARY     Status: Abnormal   Collection Time    06/26/14  8:06 PM      Result Value Ref Range   Glucose-Capillary 245 (*) 70 - 99 mg/dL   Comment 1 Notify RN     Comment 2 Documented in Chart    GLUCOSE, CAPILLARY     Status: Abnormal   Collection Time    06/27/14 12:24 AM      Result Value Ref Range   Glucose-Capillary 279 (*) 70 - 99 mg/dL   Comment 1 Notify RN     Comment 2 Documented in Chart    CBC     Status: Abnormal   Collection Time    06/27/14  2:50 AM      Result Value Ref Range   WBC 14.0 (*) 4.0 - 10.5 K/uL   RBC 4.52  4.22 - 5.81 MIL/uL   Hemoglobin 13.4  13.0 - 17.0 g/dL   HCT 40.1  39.0 - 52.0 %   MCV 88.7  78.0 - 100.0 fL   MCH 29.6  26.0 - 34.0 pg   MCHC 33.4  30.0 - 36.0 g/dL   RDW 14.9  11.5 - 15.5 %   Platelets 209  150 - 400 K/uL  HEPARIN LEVEL (UNFRACTIONATED)     Status: None   Collection Time    06/27/14  2:50 AM      Result Value Ref Range   Heparin Unfractionated 0.52  0.30 - 0.70 IU/mL  BASIC METABOLIC PANEL     Status: Abnormal   Collection Time    06/27/14  2:50 AM      Result  Value Ref Range   Sodium 132 (*) 137 - 147 mEq/L   Potassium 4.0  3.7 - 5.3 mEq/L   Chloride 94 (*) 96 - 112 mEq/L   CO2 26  19 - 32 mEq/L   Glucose, Bld 265 (*) 70 - 99 mg/dL   BUN 40 (*) 6 - 23 mg/dL   Creatinine, Ser 1.78 (*) 0.50 - 1.35 mg/dL   Calcium 8.7  8.4 - 10.5 mg/dL   GFR calc non Af Amer 36 (*) >90 mL/min   GFR calc Af Amer 41 (*) >90 mL/min  MAGNESIUM     Status: None   Collection Time    06/27/14  2:50 AM      Result Value Ref Range   Magnesium 2.1  1.5 - 2.5 mg/dL  PHOSPHORUS     Status: None   Collection Time    06/27/14  2:50 AM      Result Value Ref Range   Phosphorus 2.7  2.3 - 4.6 mg/dL  GLUCOSE, CAPILLARY     Status: Abnormal   Collection Time    06/27/14  5:16 AM      Result Value Ref Range   Glucose-Capillary 226 (*) 70 - 99 mg/dL  GLUCOSE, CAPILLARY     Status: Abnormal   Collection Time    06/27/14  7:54 AM      Result Value Ref Range   Glucose-Capillary 160 (*) 70 - 99 mg/dL  GLUCOSE, CAPILLARY     Status: Abnormal   Collection Time    06/27/14 11:53 AM      Result Value Ref Range   Glucose-Capillary 259 (*) 70 - 99 mg/dL   Comment 1 Notify RN      Studies/Results: Dg Chest Port 1 View  06/26/2014   CLINICAL DATA:  Followup edema  EXAM: PORTABLE CHEST - 1 VIEW  COMPARISON:  06/25/2014  FINDINGS: Endotracheal tube removed.  Central venous catheter tip in the SVC.  Interval improvement in pulmonary edema. Improvement in  bibasilar atelectasis which remains. Small right effusion has improved.  IMPRESSION: Significant improvement in pulmonary edema. Mild bibasilar atelectasis remains.  Endotracheal tube removed.   Electronically Signed   By: Franchot Gallo M.D.   On: 06/26/2014 07:26    Medications: Reviewed   @PROBHOSP @  1.  Afib.  Rate control    2.  HTN  Fair control.   2.  NSTEMI  No CP  Most likely NSTEMI in setting of rapid afib and HTN which was prob driven by COPD and hypoxia.  Would continue medical Rx.    3.  Renal insufficiency  Stage III  Cr has come down some  Was 1.2 earlier this admit.      4.  Acute on chronic diastolic CHF.   Improved from admitt  Check BNP  Not on duretic.   Poss due to AFib with RVR.   Would d/c home on lasix at usual dose.   5.  PVOD  I would recomm that patient stay in hosp until med condition stabilizes.  Discussed with Dr Burt Knack  He also recomm medical Rx. Patient wants to leave  If does this will be AMA   We can arrange for outpatient f/u next wk.    LOS: 3 days   Dorris Carnes 06/27/2014, 12:34 PM

## 2014-06-27 NOTE — Progress Notes (Signed)
Attending MD returned page and spoke to patient. Patient decided to continue with his medical treatment.

## 2014-06-27 NOTE — Progress Notes (Signed)
Patient seen earlier today  Had originally planned to leave.  Now agrees to stay until AM I would discontinue anticoagulation.  ON admit, patinet in afib  Converted  Has not had since.  I would not put on long term anticoagulation.  Would keep on ecASA 81mg .  I would rx with diltiazem and coreg. In regards to lasix, his volume is up some but he is comfortable.  But, his cr has not gotten to baseline.  I would reassess in am  He can go back on lasix orally tomorrow  Dorris Carnes

## 2014-06-27 NOTE — Progress Notes (Signed)
NUTRITION FOLLOW UP/ CONSULT for diet ed  Intervention:   Provided and reviewed "Low Sodium nutrition Therapy" handout from the Academy of [employer] and Dietetics Encourage healthful PO intake  Nutrition Dx:   Inadequate oral intake related to inability to eat as evidenced by NPO status; discontinued  Goal:   Intake to meet >90% of estimated nutrition needs; being met  Monitor:   TF tolerance/adequacy, weight trend, labs, vent status.  Assessment:   78 y/o M, current smoker, with PMH of HTN, CAD s/p MI, PVD & bipolar disorder who presented to [facility] ER on 6/28 with hypercarbic respiratory failure & hypertensive emergency.   Pt has been extubated and diet was advanced to Carb Modified on 6/30. Pt is now eating 100% of his meals. RD consulted 2 gram sodium diet education along with low carb. Pt states he has had diabetes for almost 20 years and his blood sugars are usually between 80 and 140.  RD reviewed patients dietary recall. Pt is not interested in reviewing diabetic diet but, is interested in learning about sodium.   RD provided and reviewed "Low Sodium Nutrition Therapy" handout from the Academy of Nutrition and Dietetics. Reviewed patient's dietary recall. Provided examples on ways to decrease sodium intake in diet. Discouraged intake of processed foods and use of salt shaker. Encouraged fresh fruits and vegetables as well as whole grain sources of carbohydrates to maximize fiber intake.  Pt states he was using a lot of salt PTA. He is willing to cut back to 1 teaspoon per day and will decrease his intake of high sodium foods. RD discussed that 1 teaspoon provides > 2 grams and encouraged pt to cut back on salt intake to 1/4 teaspoon. Expect fair compliance.   Height: Ht Readings from Last 1 Encounters:  06/26/14 6' (1.829 m)    Weight Status:   Wt Readings from Last 1 Encounters:  06/27/14 232 lb 6.4 oz (105.416 kg)    Re-estimated needs:  Kcal: 1900-2100 Protein: 90-100  grams Fluid: 2.1 L/day  Skin: intact; +1 RLE edema and +2 LLE edema  Diet Order: Carb Control   Intake/Output Summary (Last 24 hours) at 06/27/14 1721 Last data filed at 06/27/14 1500  Gross per 24 hour  Intake 984.67 ml  Output   2026 ml  Net -1041.33 ml    Last BM: 6/30   Labs:   Recent Labs Lab 06/24/14 1532 06/25/14 0205 06/25/14 1045 06/26/14 0113 06/27/14 0250  NA 139 138  --  134* 132*  K 5.1 4.6  --  4.4 4.0  CL 98 98  --  96 94*  CO2 24 28  --  26 26  BUN 15 20  --  35* 40*  CREATININE 1.28 1.77*  --  1.99* 1.78*  CALCIUM 8.9 8.8  --  8.3* 8.7  MG 1.4*  --  1.3*  --  2.1  PHOS  --   --  3.0  --  2.7  GLUCOSE 253* 190*  --  383* 265*    CBG (last 3)   Recent Labs  06/27/14 0754 06/27/14 1153 06/27/14 1644  GLUCAP 160* 259* 336*    Scheduled Meds: . antiseptic oral rinse  15 mL Mouth Rinse QID  . [START ON 06/28/2014] aspirin  81 mg Oral Daily   Or  . [START ON 06/28/2014] aspirin  300 mg Rectal Daily  . atorvastatin  40 mg Oral q1800  . carvedilol  12.5 mg Oral BID WC  . chlorhexidine  15 mL Mouth Rinse BID  . cilostazol  50 mg Oral BID  . [START ON 06/28/2014] diltiazem  240 mg Oral Daily  . diltiazem  120 mg Oral Daily  . fentaNYL  50 mcg Intravenous Once  . finasteride  5 mg Oral Daily  . gabapentin  300 mg Oral TID  . heparin subcutaneous  5,000 Units Subcutaneous 3 times per day  . insulin aspart  0-20 Units Subcutaneous TID WC  . insulin glargine  35 Units Subcutaneous QHS  . latanoprost  1 drop Both Eyes QHS  . pantoprazole  40 mg Oral Q1200  . predniSONE  40 mg Oral Q breakfast  . tamsulosin  0.4 mg Oral Daily    Continuous Infusions:   Pryor Ochoa RD, LDN Inpatient Clinical Dietitian Pager: [phone] After Hours Pager: [phone]

## 2014-06-28 DIAGNOSIS — I5033 Acute on chronic diastolic (congestive) heart failure: Secondary | ICD-10-CM

## 2014-06-28 LAB — GLUCOSE, CAPILLARY
GLUCOSE-CAPILLARY: 357 mg/dL — AB (ref 70–99)
Glucose-Capillary: 283 mg/dL — ABNORMAL HIGH (ref 70–99)
Glucose-Capillary: 329 mg/dL — ABNORMAL HIGH (ref 70–99)
Glucose-Capillary: 238 mg/dL — ABNORMAL HIGH (ref 70–99)
Glucose-Capillary: 140 mg/dL — ABNORMAL HIGH (ref 70–99)
Glucose-Capillary: 180 mg/dL — ABNORMAL HIGH (ref 70–99)

## 2014-06-28 LAB — BASIC METABOLIC PANEL
Anion gap: 10 (ref 5–15)
BUN: 41 mg/dL — ABNORMAL HIGH (ref 6–23)
CO2: 27 mEq/L (ref 19–32)
Calcium: 8.9 mg/dL (ref 8.4–10.5)
Chloride: 99 mEq/L (ref 96–112)
Creatinine, Ser: 1.6 mg/dL — ABNORMAL HIGH (ref 0.50–1.35)
GFR calc Af Amer: 47 mL/min — ABNORMAL LOW (ref >90)
GFR calc non Af Amer: 41 mL/min — ABNORMAL LOW (ref >90)
GLUCOSE: 327 mg/dL — AB (ref 70–99)
POTASSIUM: 4.9 meq/L (ref 3.7–5.3)
SODIUM: 136 meq/L — AB (ref 137–147)

## 2014-06-28 LAB — CBC
HEMATOCRIT: 39.4 % (ref 39.0–52.0)
Hemoglobin: 13 g/dL (ref 13.0–17.0)
MCH: 29.5 pg (ref 26.0–34.0)
MCHC: 33 g/dL (ref 30.0–36.0)
MCV: 89.5 fL (ref 78.0–100.0)
PLATELETS: 195 10*3/uL (ref 150–400)
RBC: 4.4 MIL/uL (ref 4.22–5.81)
RDW: 15 % (ref 11.5–15.5)
WBC: 10.9 10*3/uL — ABNORMAL HIGH (ref 4.0–10.5)

## 2014-06-28 LAB — RESPIRATORY VIRUS PANEL
Adenovirus: NOT DETECTED
INFLUENZA A: NOT DETECTED
Influenza A H1: NOT DETECTED
Influenza A H3: NOT DETECTED
Influenza B: NOT DETECTED
Metapneumovirus: NOT DETECTED
PARAINFLUENZA 1 A: NOT DETECTED
PARAINFLUENZA 3 A: NOT DETECTED
Parainfluenza 2: NOT DETECTED
RESPIRATORY SYNCYTIAL VIRUS B: NOT DETECTED
Respiratory Syncytial Virus A: NOT DETECTED
Rhinovirus: NOT DETECTED

## 2014-06-28 LAB — PRO B NATRIURETIC PEPTIDE: Pro B Natriuretic peptide (BNP): 2134 pg/mL — ABNORMAL HIGH (ref 0–450)

## 2014-06-28 MED ORDER — FUROSEMIDE 10 MG/ML IJ SOLN
60.0000 mg | Freq: Two times a day (BID) | INTRAMUSCULAR | Status: DC
  Administered 2014-06-28 – 2014-06-29 (×2): 60 mg via INTRAVENOUS
  Filled 2014-06-28 (×2): qty 6

## 2014-06-28 MED ORDER — HEPARIN SODIUM (PORCINE) 5000 UNIT/ML IJ SOLN
5000.0000 [IU] | Freq: Three times a day (TID) | INTRAMUSCULAR | Status: DC
  Administered 2014-06-28: 5000 [IU] via SUBCUTANEOUS
  Filled 2014-06-28 (×3): qty 1

## 2014-06-28 MED ORDER — FUROSEMIDE 10 MG/ML IJ SOLN
60.0000 mg | Freq: Once | INTRAMUSCULAR | Status: AC
  Administered 2014-06-28: 60 mg via INTRAVENOUS
  Filled 2014-06-28: qty 6

## 2014-06-28 MED ORDER — INSULIN GLARGINE 100 UNIT/ML ~~LOC~~ SOLN
40.0000 [IU] | Freq: Every day | SUBCUTANEOUS | Status: DC
  Administered 2014-06-28: 40 [IU] via SUBCUTANEOUS
  Filled 2014-06-28 (×2): qty 0.4

## 2014-06-28 MED ORDER — NICOTINE 21 MG/24HR TD PT24
21.0000 mg | MEDICATED_PATCH | Freq: Every day | TRANSDERMAL | Status: DC
  Administered 2014-06-28 – 2014-06-29 (×2): 21 mg via TRANSDERMAL
  Filled 2014-06-28 (×2): qty 1

## 2014-06-28 NOTE — Care Management Note (Addendum)
  Page 2 of 2   06/29/2014     2:45:49 PM CARE MANAGEMENT NOTE 06/29/2014  Patient:  [name], [name]   Account Number:  [id number]  Date Initiated:  06/25/2014  Documentation initiated by:  [facility]  Subjective/Objective Assessment:   Resp failure and HTN.  on vent and NTG drip.     Action/Plan:   Anticipated DC Date:  07/02/2014   Anticipated DC Plan:  [place]  CM consult      [facility] Choice  HOME HEALTH   Choice offered to / List presented to:  C-1 Patient        [place] arranged  HH-1 RN  [place] PT      [place].   Status of service:  Completed, signed off Medicare Important Message given?  YES (If response is "NO", the following Medicare IM given date fields will be blank) Date Medicare IM given:  06/27/2014 Medicare IM given by:  [facility] Date Additional Medicare IM given:   Additional Medicare IM given by:    Discharge Disposition:  [place]  Per UR Regulation:  Reviewed for med. necessity/level of care/duration of stay  If discussed at [place] of Stay Meetings, dates discussed:    Comments:  Taye Cato RN, BSN, MSHL, CCM  Nurse - Case Manager,  ([facility] [place])  [phone]  06/29/2014 PT RECS:  [place]:  Home with HHS:  RN, PT ([place] / Butch Penny notified)   Contact:  Reid,Mary Daughter [phone]   [phone] 06/28/14 [place], RN, BSN, [employer] [phone] Spoke with pt and daughter at bedside 06/27/14 @ 1030 regarding discharge planning.  Pt and daughter state that pt has NA services through [facility] and wish to remain with them.  No DME needs identifed at this time.

## 2014-06-28 NOTE — Progress Notes (Signed)
Inpatient Diabetes Program Recommendations  AACE/ADA: New Consensus Statement on Inpatient Glycemic Control (2013)  Target Ranges:  Prepandial:   less than 140 mg/dL      Peak postprandial:   less than 180 mg/dL (1-2 hours)      Critically ill patients:  140 - 180 mg/dL  Results for [name], [name] (MRN [id number]) as of 06/28/2014 13:11  Ref. Range 06/27/2014 22:54 06/28/2014 03:34 06/28/2014 06:16 06/28/2014 08:03 06/28/2014 11:22  Glucose-Capillary Latest Range: 70-99 mg/dL 356 (H) 283 (H) 329 (H) 238 (H) 140 (H)   Inpatient Diabetes Program Recommendations Insulin - Basal: consider increasing Lantus to 40 units (home dose) Thank you  Raoul Pitch BSN, RN,CDE Inpatient Diabetes Coordinator [phone] (team pager)

## 2014-06-28 NOTE — Progress Notes (Signed)
Subjective: My breathing is OK  No CP  My ankles are worse   Objective: Filed Vitals:   06/27/14 1900 06/27/14 2120 06/28/14 0343 06/28/14 0500  BP: 159/90 141/82 145/75   Pulse: 102 95 99   Temp: 97 F (36.1 C) 97.7 F (36.5 C) 97.9 F (36.6 C)   TempSrc: Oral Oral Oral   Resp: 20 20 20    Height:      Weight:    235 lb 14.3 oz (107 kg)  SpO2: 93% 95% 94%    Weight change: 8 lb 9.6 oz (3.9 kg)  Intake/Output Summary (Last 24 hours) at 06/28/14 0805 Last data filed at 06/28/14 0700  Gross per 24 hour  Intake    960 ml  Output   2426 ml  Net  -1466 ml   Net neg 2.12 L  General: Alert, awake, oriented x3, in no acute distress Neck:  JVP is normal Heart: irregular rate and rhythm, without murmurs, rubs, gallops.  Lungs: Clear to auscultation.  No rales or wheezes. Exemities:  1-2+ edema.  (L greated than R) Neuro: Grossly intact, nonfocal.  Tele:  Afib  70s   Lab Results: Results for orders placed during the hospital encounter of 06/24/14 (from the past 24 hour(s))  GLUCOSE, CAPILLARY     Status: Abnormal   Collection Time    06/27/14 11:53 AM      Result Value Ref Range   Glucose-Capillary 259 (*) 70 - 99 mg/dL   Comment 1 Notify RN    GLUCOSE, CAPILLARY     Status: Abnormal   Collection Time    06/27/14  4:44 PM      Result Value Ref Range   Glucose-Capillary 336 (*) 70 - 99 mg/dL  BASIC METABOLIC PANEL     Status: Abnormal   Collection Time    06/27/14  6:30 PM      Result Value Ref Range   Sodium 134 (*) 137 - 147 mEq/L   Potassium 4.9  3.7 - 5.3 mEq/L   Chloride 96  96 - 112 mEq/L   CO2 27  19 - 32 mEq/L   Glucose, Bld 405 (*) 70 - 99 mg/dL   BUN 43 (*) 6 - 23 mg/dL   Creatinine, Ser 1.63 (*) 0.50 - 1.35 mg/dL   Calcium 8.8  8.4 - 10.5 mg/dL   GFR calc non Af Amer 40 (*) >90 mL/min   GFR calc Af Amer 46 (*) >90 mL/min   Anion gap 11  5 - 15  GLUCOSE, CAPILLARY     Status: Abnormal   Collection Time    06/27/14  8:18 PM      Result Value Ref Range   Glucose-Capillary 332 (*) 70 - 99 mg/dL   Comment 1 Notify RN     Comment 2 Documented in Chart    GLUCOSE, CAPILLARY     Status: Abnormal   Collection Time    06/27/14 10:54 PM      Result Value Ref Range   Glucose-Capillary 356 (*) 70 - 99 mg/dL   Comment 1 Notify RN     Comment 2 Documented in Chart    GLUCOSE, CAPILLARY     Status: Abnormal   Collection Time    06/28/14  3:34 AM      Result Value Ref Range   Glucose-Capillary 283 (*) 70 - 99 mg/dL   Comment 1 Notify RN     Comment 2 Documented in Chart    PRO B  NATRIURETIC PEPTIDE     Status: Abnormal   Collection Time    06/28/14  4:00 AM      Result Value Ref Range   Pro B Natriuretic peptide (BNP) 2134.0 (*) 0 - 450 pg/mL  CBC     Status: Abnormal   Collection Time    06/28/14  4:00 AM      Result Value Ref Range   WBC 10.9 (*) 4.0 - 10.5 K/uL   RBC 4.40  4.22 - 5.81 MIL/uL   Hemoglobin 13.0  13.0 - 17.0 g/dL   HCT 39.4  39.0 - 52.0 %   MCV 89.5  78.0 - 100.0 fL   MCH 29.5  26.0 - 34.0 pg   MCHC 33.0  30.0 - 36.0 g/dL   RDW 15.0  11.5 - 15.5 %   Platelets 195  150 - 400 K/uL  BASIC METABOLIC PANEL     Status: Abnormal   Collection Time    06/28/14  4:00 AM      Result Value Ref Range   Sodium 136 (*) 137 - 147 mEq/L   Potassium 4.9  3.7 - 5.3 mEq/L   Chloride 99  96 - 112 mEq/L   CO2 27  19 - 32 mEq/L   Glucose, Bld 327 (*) 70 - 99 mg/dL   BUN 41 (*) 6 - 23 mg/dL   Creatinine, Ser 1.60 (*) 0.50 - 1.35 mg/dL   Calcium 8.9  8.4 - 10.5 mg/dL   GFR calc non Af Amer 41 (*) >90 mL/min   GFR calc Af Amer 47 (*) >90 mL/min   Anion gap 10  5 - 15  GLUCOSE, CAPILLARY     Status: Abnormal   Collection Time    06/28/14  6:16 AM      Result Value Ref Range   Glucose-Capillary 329 (*) 70 - 99 mg/dL   Comment 1 Notify RN     Comment 2 Documented in Chart      Studies/Results: Dg Chest Port 1 View  06/27/2014   CLINICAL DATA:  Pulmonary edema.  EXAM: PORTABLE CHEST - 1 VIEW  COMPARISON:  Multiple recent previous  exams.  FINDINGS: The diffuse interstitial and basilar alveolar opacity persists and is slightly improved in the interval. The cardio pericardial silhouette is enlarged. Right IJ central line has been removed. Telemetry leads overlie the chest.  IMPRESSION: Interval improvement in pulmonary edema pattern with removal of the right IJ central line.   Electronically Signed   By: Misty Stanley M.D.   On: 06/27/2014 07:39    Medications: Reviewed   @PROBHOSP @  1.  Afib  Rate control  I do not think he is a candidate for anticoagulation unless he demonstrates compliance  2.  Acute on chronic diastolic CHF  Patient is net neg but edema is worse (L greater than R0 I gave him some IV lasix today which he is responding to  He is willing to stay for additional if internal medicine thinks it is indicated and in his best interest. He got 1 dose of 60 IV  Responding  Would give anther today.    3.  NSTEMI.  Prob demand ischemia.  4.  CKD  Cr slowly improving.    LOS: 4 days   [name] 06/28/2014, 8:05 AM

## 2014-06-28 NOTE — Progress Notes (Signed)
[facility] Progress Note   [name] XEN:[id number] DOB: June 27, 1936 DOA: 06/24/2014 PCP: Jilda Panda, MD  Brief narrative: [name] is a 78 y.o. male  presents with a hypertensive emergency, acute resp failure requiring intubation. Admits to chronic pedal edema for which he takes Furosemide. PCP is Dr Mellody Drown and the [place] and he sees them regularly.    Subjective: Has no new complaints Assessment/Plan: Principal Problem:   Acute respiratory failure - suspect COPD exacerbation although he has no official diagnosis - Per ICU notes, pt was wheezing and pulm wedge pressure was not elevated therefore less likely to be CHF  (A) COPD??- taper oral prednisone - per pulm cannot get PFTs while he is fluid overloaded as it would effect his DLCO- will need to order as outpt or prior to d/c when he is euvolemic   (B) CHF-acute on chronic diastolic grade 3 - currently on room air  - will set him up with CHF [facility] - have asked CHF team to see him due to the severity of his diastolic dysfunction - cont CCB and BB  ECHO- 06/24/14  - Left ventricle: The cavity size was normal. There was moderate concentric hypertrophy. Systolic function was normal. The estimated ejection fraction was in the range of 50% to 55%. Hypokinesis of the inferior myocardium. Doppler parameters are consistent with a reversible restrictive pattern, indicative of decreased left ventricular diastolic compliance and/or increased left atrial pressure (grade 3 diastolic dysfunction). - Left atrium: The atrium was moderately dilated.   Active Problems:  NSTEMI -cardiology suspects demand ischemia- OK with cards to stop Heparin    Atrial fibrillation- chronic - rate controlled -noted that he is not on anticoagulation per med rec- - cardiology advised that he does not need anticoagulation-     Tobacco abuse - has been advised by Dr Burt Knack to quit many times but has not - have explained the related  between smoking and COPD again today and have advised him to quit smoking    DM - increase Lantus to 40 U as sugar slightly elevated still - he is on Regular insulin 8 U BID- on sliding scale here-     HYPERTENSION- with HTN urgency on admission - cont CCB and BB    CAD/ PVD - cont ASA and pletal  BPH - cont Flomax and proscar   Code Status: full code  Family Communication: none Disposition Plan: home  Consultants: cardio  Procedures: OETT 6/28 >>> 6/29  R IJ CVL 6/29 >>>  CULTURES:  Sputum 6/28 >>> mod gram + cocci in pairs, non-path oral flora  Resp virus 6/28 >>>  Urine 6/28 >>> neg  Blood culture 6/28 >>> ngtd >>>  Urine Tox 6/28 >>> + THC      Antibiotics: Anti-infectives   Start     Dose/Rate Route Frequency Ordered Stop   06/24/14 1500  doxycycline (VIBRAMYCIN) 100 mg in dextrose 5 % 250 mL IVPB  Status:  Discontinued     100 mg 125 mL/hr over 120 Minutes Intravenous Every 12 hours 06/24/14 1440 06/26/14 1505   06/24/14 1415  doxycycline (VIBRA-TABS) tablet 100 mg  Status:  Discontinued     100 mg Oral Every 12 hours 06/24/14 1407 06/24/14 1440       DVT prophylaxis:  s/c Heparin-  Objective: Filed Weights   06/26/14 1807 06/27/14 0522 06/28/14 0500  Weight: 103.1 kg (227 lb 4.7 oz) 105.416 kg (232 lb 6.4 oz) 107 kg (235 lb 14.3 oz)  Vitals Filed Vitals:   06/28/14 0343 06/28/14 0500 06/28/14 1119 06/28/14 1450  BP: 145/75  124/93 121/52  Pulse: 99  97 102  Temp: 97.9 F (36.6 C)   98 F (36.7 C)  TempSrc: Oral   Oral  Resp: 20   18  Height:      Weight:  107 kg (235 lb 14.3 oz)    SpO2: 94%   98%      Intake/Output Summary (Last 24 hours) at 06/28/14 1511 Last data filed at 06/28/14 1204  Gross per 24 hour  Intake    720 ml  Output   1975 ml  Net  -1255 ml     Exam: General: No acute respiratory distress Lungs: mild crackles at bases 98% on RA Cardiovascular: Regular rate and rhythm without murmur gallop or rub normal S1  and S2 Abdomen: Nontender, nondistended, soft, bowel sounds positive, no rebound, no ascites, no appreciable mass Extremities: No significant cyanosis, clubbing,-  edema left leg 2 +  Data Reviewed: Basic Metabolic Panel:  Recent Labs Lab 06/24/14 1114 06/24/14 1532 06/25/14 0205 06/25/14 1045 06/26/14 0113 06/27/14 0250 06/27/14 1830 06/28/14 0400  NA 141 139 138  --  134* 132* 134* 136*  K 4.7 5.1 4.6  --  4.4 4.0 4.9 4.9  CL 97 98 98  --  96 94* 96 99  CO2  --  24 28  --  26 26 27 27   GLUCOSE 268* 253* 190*  --  383* 265* 405* 327*  BUN 13 15 20   --  35* 40* 43* 41*  CREATININE 1.50* 1.28 1.77*  --  1.99* 1.78* 1.63* 1.60*  CALCIUM  --  8.9 8.8  --  8.3* 8.7 8.8 8.9  MG  --  1.4*  --  1.3*  --  2.1  --   --   PHOS  --   --   --  3.0  --  2.7  --   --    Liver Function Tests:  Recent Labs Lab 06/24/14 1051 06/24/14 1532 06/26/14 0113  AST 46* 59* 23  ALT 26 34 21  ALKPHOS 52 49 43  BILITOT 0.6 0.6 0.4  PROT 8.5* 7.7 6.3  ALBUMIN 3.5 3.2* 2.5*   No results found for this basename: LIPASE, AMYLASE,  in the last 168 hours No results found for this basename: AMMONIA,  in the last 168 hours CBC:  Recent Labs Lab 06/24/14 1051  06/24/14 1532 06/25/14 0205 06/26/14 0113 06/27/14 0250 06/28/14 0400  WBC 9.4  --  7.3 7.9 13.5* 14.0* 10.9*  NEUTROABS 4.4  --  6.6  --  12.4*  --   --   HGB 15.6  < > 14.6 13.0 13.5 13.4 13.0  HCT 47.9  < > 43.5 39.6 41.2 40.1 39.4  MCV 93.0  --  91.2 89.8 88.6 88.7 89.5  PLT 232  --  203 200 209 209 195  < > = values in this interval not displayed. Cardiac Enzymes:  Recent Labs Lab 06/24/14 1051 06/24/14 1532 06/24/14 2220 06/25/14 0638  CKTOTAL  --  150  --   --   CKMB  --  5.3*  --   --   TROPONINI <0.30 0.32* 2.52* 1.23*   BNP (last 3 results)  Recent Labs  06/24/14 1051 06/24/14 1532 06/28/14 0400  PROBNP 4355.0* 5446.0* 2134.0*   CBG:  Recent Labs Lab 06/27/14 2254 06/28/14 0334 06/28/14 0616  06/28/14 0803 06/28/14 1122  GLUCAP 356*  283* 329* 238* 140*    Recent Results (from the past 240 hour(s))  MRSA PCR SCREENING     Status: None   Collection Time    06/24/14  2:18 PM      Result Value Ref Range Status   MRSA by PCR NEGATIVE  NEGATIVE Final   Comment:            The GeneXpert MRSA Assay (FDA     approved for NASAL specimens     only), is one component of a     comprehensive MRSA colonization     surveillance program. It is not     intended to diagnose MRSA     infection nor to guide or     monitor treatment for     MRSA infections.  CULTURE, BLOOD (ROUTINE X 2)     Status: None   Collection Time    06/24/14  3:17 PM      Result Value Ref Range Status   Specimen Description BLOOD LEFT ARM   Final   Special Requests BOTTLES DRAWN AEROBIC AND ANAEROBIC 10CC   Final   Culture  Setup Time     Final   Value: 06/24/2014 22:50     Performed at [facility]   Culture     Final   Value:        BLOOD CULTURE RECEIVED NO GROWTH TO DATE CULTURE WILL BE HELD FOR 5 DAYS BEFORE ISSUING A FINAL NEGATIVE REPORT     Performed at [facility]   Report Status PENDING   Incomplete  CULTURE, RESPIRATORY (NON-EXPECTORATED)     Status: None   Collection Time    06/24/14  3:35 PM      Result Value Ref Range Status   Specimen Description TRACHEAL ASPIRATE   Final   Special Requests Normal   Final   Gram Stain     Final   Value: RARE WBC PRESENT, PREDOMINANTLY MONONUCLEAR     NO SQUAMOUS EPITHELIAL CELLS SEEN     MODERATE GRAM POSITIVE COCCI IN PAIRS     Performed at [facility]   Culture     Final   Value: Non-Pathogenic Oropharyngeal-type Flora Isolated.     Performed at [facility]   Report Status 06/27/2014 FINAL   Final  URINE CULTURE     Status: None   Collection Time    06/24/14  5:16 PM      Result Value Ref Range Status   Specimen Description URINE, CATHETERIZED   Final   Special Requests Immunocompromised   Final   Culture  Setup  Time     Final   Value: 06/25/2014 02:14     Performed at [place]     Final   Value: NO GROWTH     Performed at [facility]   Culture     Final   Value: NO GROWTH     Performed at [facility]   Report Status 06/26/2014 FINAL   Final  CULTURE, BLOOD (ROUTINE X 2)     Status: None   Collection Time    06/24/14  5:31 PM      Result Value Ref Range Status   Specimen Description BLOOD RIGHT HAND   Final   Special Requests BOTTLES DRAWN AEROBIC ONLY [place]   Final   Culture  Setup Time     Final   Value: 06/25/2014 01:49  Performed at [facility]     Final   Value:        BLOOD CULTURE RECEIVED NO GROWTH TO DATE CULTURE WILL BE HELD FOR 5 DAYS BEFORE ISSUING A FINAL NEGATIVE REPORT     Performed at [facility]   Report Status PENDING   Incomplete     Studies:  Recent x-ray studies have been reviewed in detail by the Attending Physician  Scheduled Meds:  Scheduled Meds: . antiseptic oral rinse  15 mL Mouth Rinse QID  . aspirin  81 mg Oral Daily   Or  . aspirin  300 mg Rectal Daily  . atorvastatin  40 mg Oral q1800  . carvedilol  12.5 mg Oral BID WC  . chlorhexidine  15 mL Mouth Rinse BID  . cilostazol  50 mg Oral BID  . diltiazem  240 mg Oral Daily  . fentaNYL  50 mcg Intravenous Once  . finasteride  5 mg Oral Daily  . furosemide  60 mg Intravenous BID  . gabapentin  300 mg Oral TID  . heparin subcutaneous  5,000 Units Subcutaneous 3 times per day  . heparin subcutaneous  5,000 Units Subcutaneous 3 times per day  . insulin aspart  0-20 Units Subcutaneous TID WC  . insulin glargine  35 Units Subcutaneous QHS  . latanoprost  1 drop Both Eyes QHS  . pantoprazole  40 mg Oral Q1200  . predniSONE  40 mg Oral Q breakfast  . tamsulosin  0.4 mg Oral Daily   Continuous Infusions:    Time spent on care of this patient: > 45 min   Jenera, MD 06/28/2014, 3:11 PM  LOS: 4 days   [facility] Office  [phone] Pager - Text Page per Shea Evans   If 7PM-7AM, please contact night-coverage [email or web address]

## 2014-06-28 NOTE — Progress Notes (Signed)
Report given to receiving RN. Patient sitting on side of the bed watching TV. No verbal complaints. No signs or symptoms of distress or discomfort noted.

## 2014-06-29 DIAGNOSIS — F17200 Nicotine dependence, unspecified, uncomplicated: Secondary | ICD-10-CM

## 2014-06-29 LAB — GLUCOSE, CAPILLARY
GLUCOSE-CAPILLARY: 204 mg/dL — AB (ref 70–99)
GLUCOSE-CAPILLARY: 217 mg/dL — AB (ref 70–99)
GLUCOSE-CAPILLARY: 248 mg/dL — AB (ref 70–99)
Glucose-Capillary: 136 mg/dL — ABNORMAL HIGH (ref 70–99)

## 2014-06-29 LAB — CBC
HCT: 40.3 % (ref 39.0–52.0)
HEMOGLOBIN: 13.1 g/dL (ref 13.0–17.0)
MCH: 29.3 pg (ref 26.0–34.0)
MCHC: 32.5 g/dL (ref 30.0–36.0)
MCV: 90.2 fL (ref 78.0–100.0)
Platelets: 196 10*3/uL (ref 150–400)
RBC: 4.47 MIL/uL (ref 4.22–5.81)
RDW: 15.2 % (ref 11.5–15.5)
WBC: 9.5 10*3/uL (ref 4.0–10.5)

## 2014-06-29 LAB — BASIC METABOLIC PANEL
Anion gap: 12 (ref 5–15)
BUN: 40 mg/dL — ABNORMAL HIGH (ref 6–23)
CHLORIDE: 96 meq/L (ref 96–112)
CO2: 31 mEq/L (ref 19–32)
Calcium: 9.2 mg/dL (ref 8.4–10.5)
Creatinine, Ser: 1.6 mg/dL — ABNORMAL HIGH (ref 0.50–1.35)
GFR calc Af Amer: 47 mL/min — ABNORMAL LOW (ref >90)
GFR calc non Af Amer: 41 mL/min — ABNORMAL LOW (ref >90)
Glucose, Bld: 212 mg/dL — ABNORMAL HIGH (ref 70–99)
POTASSIUM: 4 meq/L (ref 3.7–5.3)
Sodium: 139 mEq/L (ref 137–147)

## 2014-06-29 MED ORDER — NICOTINE 14 MG/24HR TD PT24: 14.0000 mg | MEDICATED_PATCH | Freq: Every day | TRANSDERMAL | Status: DC

## 2014-06-29 MED ORDER — PREDNISONE 20 MG PO TABS: 20.0000 mg | ORAL_TABLET | Freq: Every day | ORAL | Status: DC

## 2014-06-29 MED ORDER — FUROSEMIDE 40 MG PO TABS: 40.0000 mg | ORAL_TABLET | Freq: Two times a day (BID) | ORAL | Status: DC

## 2014-06-29 MED ORDER — DILTIAZEM HCL ER COATED BEADS 120 MG PO TB24: 240.0000 mg | ORAL_TABLET | Freq: Every day | ORAL | Status: DC

## 2014-06-29 NOTE — Discharge Summary (Deleted)
Physician Discharge Summary  [name] UMP:[id number] DOB: 10-12-1936 DOA: 06/24/2014  PCP: Jilda Panda, MD  Admit date: 06/24/2014 Discharge date: 06/29/2014  Time spent: >45 minutes  Recommendations for Outpatient Follow-up:  1. Home with [facility] for CHF management 2. Needs PFTs as outpt once euvolemic 3. Need to f/u with Cardiology and be assessed for long term anticoagulation for A-fib 4. NEEDS BMET IN 1 WK AS DOSE OF LASIX HAS BEEN INCREASED  Discharge Diagnoses:  Principal Problem:   Acute respiratory failure- suspected COPD exacerbation Active Problems:   DM   HYPERTENSION   CAD   PVD   Pulmonary edema   Atrial fibrillation   Tobacco abuse   Diastolic CHF, chronic   Discharge Condition: stable  Diet recommendation: low sodium,low fat, diabetic diet  Filed Weights   06/27/14 0522 06/28/14 0500 06/29/14 0359  Weight: 105.416 kg (232 lb 6.4 oz) 107 kg (235 lb 14.3 oz) 104.7 kg (230 lb 13.2 oz)    History of present illness:  78 y/o M, current smoker, per daughter hx of non compliance, daily bourbon intake,with PMH of HTN, CAD s/p MI, PVD & bipolar disorder who presented to [facility] ER on 6/28 from home with complaints of shortness of breath. Per daughter several months of cough, few days of fatigue and increased edema but refused to see doctor. EMS found patient to be tripoding, unable to speak with bilateral wheezing. Patient was medicated with solu-medrol 125 mg and breathing treatments x 2. He had progressive decline with reduced mental status and increased WOB requiring intubation in ER. PCCM consulted for ICU admit.   When alert, admits to chronic pedal edema for which he takes Furosemide. PCP is Dr Mellody Drown and the [place] and he states he sees them regularly.      Hospital Course:  Principal Problem:  Acute respiratory failure  - suspect COPD exacerbation although he has no official diagnosis  - Per ICU notes, pt was wheezing and pulm wedge pressure was not elevated  therefore less likely to be CHF   (A) COPD??- tapering oral prednisone -  will need to order as outpt when he is euvolemic   (B) CHF-acute on chronic diastolic grade 3  - currently on room air  - will set him up with [facility]F [facility]  - have asked CHF team to see him at oupt - maintain BP control - cont CCB and BB  - Dr Aundra Dubin recommends increasing Lasix from 40 daily to 40 BID at home  His Aldactone was stopped by PCP   ECHO- 06/24/14  - Left ventricle: The cavity size was normal. There was moderate concentric hypertrophy. Systolic function was normal. The estimated ejection fraction was in the range of 50% to 55%. Hypokinesis of the inferior myocardium. Doppler parameters are consistent with a reversible restrictive pattern, indicative of decreased left ventricular diastolic compliance and/or increased left atrial pressure (grade 3 diastolic dysfunction). - Left atrium: The atrium was moderately dilated.  Active Problems:  NSTEMI  -cardiology suspects demand ischemia in related to respiratory failure- OK with cards to stop Heparin   Atrial fibrillation- chronic  - rate controlled  -noted that he is not on anticoagulation - cardiology advised that he need to f/u with Dr Burt Knack to decide upon anticoagulation   Tobacco abuse  - has been advised by Dr Burt Knack to quit many times but has not  - have explained the related between smoking, PVD and COPD again today in presence of his daughter and have advised him  to quit smoking   Pedal edema - suspect partly due to venous stasis as despite giving Lasix it has gotten worse overnight-- as noted above lungs are CTA b/l  - recommend TEDS  DM  - uncontrolled- cont home doses on insulin on d/c- further management per PCP  HYPERTENSION- with HTN urgency on admission  - cont CCB and BB   CAD/ PVD  - cont ASA and pletal   BPH  - cont Flomax and proscar   Procedures: OETT 6/28 >>> 6/29  R IJ CVL 6/29 >>>  CULTURES:  Sputum 6/28 >>>  mod gram + cocci in pairs, non-path oral flora  Resp virus 6/28 >>>  Urine 6/28 >>> neg  Blood culture 6/28 >>> ngtd >>>  Urine Tox 6/28 >>> + THC    Consultations:  pulm critical care  [place] [facility]  Discharge Exam: Filed Vitals:   06/29/14 1035  BP: 154/87  Pulse: 97  Temp:   Resp: 18   General: No acute respiratory distress  Lungs: mild crackles at bases 98% on RA  Cardiovascular: Regular rate and rhythm without murmur gallop or rub normal S1 and S2  Abdomen: Nontender, nondistended, soft, bowel sounds positive, no rebound, no ascites, no appreciable mass  Extremities: No significant cyanosis, clubbing,- 2 + edema b/l legs  Discharge Instructions You were cared for by a hospitalist during your hospital stay. If you have any questions about your discharge medications or the care you received while you were in the hospital after you are discharged, you can call the unit and asked to speak with the hospitalist on call if the hospitalist that took care of you is not available. Once you are discharged, your primary care physician will handle any further medical issues. Please note that NO REFILLS for any discharge medications will be authorized once you are discharged, as it is imperative that you return to your primary care physician (or establish a relationship with a primary care physician if you do not have one) for your aftercare needs so that they can reassess your need for medications and monitor your lab values.  Discharge Instructions   Compression stockings    Complete by:  As directed      Diet - low sodium heart healthy    Complete by:  As directed   Low fat and carb modified     Diet - low sodium heart healthy    Complete by:  As directed      Increase activity slowly    Complete by:  As directed      Increase activity slowly    Complete by:  As directed             Medication List    STOP taking these medications                  TAKE these  medications       albuterol 108 (90 BASE) MCG/ACT inhaler  Commonly known as:  PROVENTIL HFA;VENTOLIN HFA  Inhale 2 puffs into the lungs every 6 (six) hours as needed for shortness of breath.     aspirin 325 MG EC tablet  Take 325 mg by mouth daily.     atorvastatin 40 MG tablet  Commonly known as:  LIPITOR  Take 20 mg by mouth at bedtime.     carvedilol 12.5 MG tablet  Commonly known as:  COREG  Take 12.5 mg by mouth 2 (two) times daily with a meal.  cilostazol 50 MG tablet  Commonly known as:  PLETAL  Take 50 mg by mouth 2 (two) times daily.     cyanocobalamin 1000 MCG tablet  Take 1,000 mcg by mouth daily.     DAILY MULTIPLE VITAMINS PO  Take 1 tablet by mouth once a day.     diltiazem 120 MG 24 hr tablet  Commonly known as:  CARDIZEM LA  Take 2 tablets (240 mg total) by mouth daily.     divalproex 250 MG DR tablet  Commonly known as:  DEPAKOTE  Take 250 mg by mouth at bedtime. 6 tablets everyday.     finasteride 5 MG tablet  Commonly known as:  PROSCAR  Take 5 mg by mouth daily.     furosemide 40 MG tablet  Commonly known as:  LASIX  Take 1 tablet (40 mg total) by mouth 2 (two) times daily.     gabapentin 300 MG capsule  Commonly known as:  NEURONTIN  Take 300 mg by mouth 3 (three) times daily.     insulin glargine 100 UNIT/ML injection  Commonly known as:  LANTUS  Inject 40 Units into the skin daily.     insulin regular 100 units/mL injection  Commonly known as:  NOVOLIN R,HUMULIN R  Inject 8 Units into the skin 2 (two) times daily before a meal.     lisinopril 40 MG tablet  Commonly known as:  PRINIVIL,ZESTRIL  40 mg daily.     nicotine 14 mg/24hr patch  Commonly known as:  NICODERM CQ - dosed in mg/24 hours  Place 1 patch (14 mg total) onto the skin daily.     pantoprazole 40 MG tablet  Commonly known as:  PROTONIX  Take 40 mg by mouth daily.     predniSONE 20 MG tablet  Commonly known as:  DELTASONE  Take 1 tablet (20 mg total) by mouth  daily with breakfast.     sildenafil 100 MG tablet  Commonly known as:  VIAGRA  Take 100 mg by mouth daily as needed for erectile dysfunction. Take as directed as needed 1 hour before sexual activity.     tamsulosin 0.4 MG Caps capsule  Commonly known as:  FLOMAX  Take 0.4 mg by mouth daily.     travoprost (benzalkonium) 0.004 % ophthalmic solution  Commonly known as:  TRAVATAN  Place 1 drop into both eyes at bedtime.       Allergies  Allergen Reactions  . Penicillins Other (See Comments)     hands and feet edema   Follow-up Information   Follow up with [facility].   Contact information:   [id number]  [place], [place] [address] [phone]      Follow up with Beacher May, MD.   Specialty:  Internal Medicine   Contact information:   [place] [address] [phone]       Follow up with Sherren Mocha, MD. (Our office will call you for appt. )    Specialty:  Cardiology   Contact information:   [address] [address] [phone]       Follow up with Jilda Panda, MD. Schedule an appointment as soon as possible for a visit in 1 week.   Specialty:  Internal Medicine   Contact information:   [address] [place] [address] [phone]        The results of significant diagnostics from this hospitalization (including imaging, microbiology, ancillary  and laboratory) are listed below for reference.    Significant Diagnostic Studies: Dg Chest Port 1 View  06/27/2014   CLINICAL DATA:  Pulmonary edema.  EXAM: PORTABLE CHEST - 1 VIEW  COMPARISON:  Multiple recent previous exams.  FINDINGS: The diffuse interstitial and basilar alveolar opacity persists and is slightly improved in the interval. The cardio pericardial silhouette is enlarged. Right IJ central line has been removed. Telemetry leads overlie the chest.  IMPRESSION: Interval improvement in pulmonary edema pattern with removal of the  right IJ central line.   Electronically Signed   By: Misty Stanley M.D.   On: 06/27/2014 07:39   Dg Chest Port 1 View  06/26/2014   CLINICAL DATA:  Followup edema  EXAM: PORTABLE CHEST - 1 VIEW  COMPARISON:  06/25/2014  FINDINGS: Endotracheal tube removed.  Central venous catheter tip in the SVC.  Interval improvement in pulmonary edema. Improvement in bibasilar atelectasis which remains. Small right effusion has improved.  IMPRESSION: Significant improvement in pulmonary edema. Mild bibasilar atelectasis remains.  Endotracheal tube removed.   Electronically Signed   By: Franchot Gallo M.D.   On: 06/26/2014 07:26   Dg Chest Port 1 View  06/25/2014   CLINICAL DATA:  Central line placement.  EXAM: PORTABLE CHEST - 1 VIEW  COMPARISON:  06/25/2014.  FINDINGS: The endotracheal tube and NG tubes are stable. There is a new right IJ central venous catheter with its tip at the level of the carina in the region of the mid SVC. The heart and lungs are stable.  IMPRESSION: Right IJ central venous catheter in good position with its tip in the mid SVC. No complicating features such as pneumothorax.  Stable mild cardiac enlargement, pulmonary vascular congestion and possible mild edema.   Electronically Signed   By: Kalman Jewels M.D.   On: 06/25/2014 12:27   Dg Chest Port 1 View  06/25/2014   CLINICAL DATA:  Evaluate endotracheal tube, airspace disease  EXAM: PORTABLE CHEST - 1 VIEW  COMPARISON:  06/24/2014; 11/29/2012; 07/09/2011  FINDINGS: Grossly unchanged enlarged cardiac silhouette and mediastinal contours given persistently reduced lung volumes and patient rotation. Atherosclerotic plaque within the thoracic aorta. Stable position of support apparatus. No pneumothorax. Worsening bilateral mid and lower lung heterogeneous opacities, right greater than left. Pulmonary vasculature remains indistinct with cephalization of flow. Trace bilateral effusions are not excluded, right greater than left. Grossly unchanged  bones.  IMPRESSION: 1.  Stable positioning of support apparatus.  No pneumothorax. 2. Findings most suggestive of worsening pulmonary edema and bibasilar opacities, atelectasis versus infiltrate.   Electronically Signed   By: Sandi Mariscal M.D.   On: 06/25/2014 07:55   Dg Chest Portable 1 View  06/24/2014   CLINICAL DATA:  SHORTNESS OF BREATH  EXAM: PORTABLE CHEST - 1 VIEW  COMPARISON:  11/29/2012  FINDINGS: Endotracheal tube has been placed, tip approximately 5.8 cm above carina. Atheromatous aorta. Heart size upper limits normal. Moderate interstitial and airspace opacities in a predominantly perihilar and infrahilar distribution, increased since previous exam, right greater than left. No effusion. Visualized skeletal structures are unremarkable.  IMPRESSION: 1. Endotracheal tube tip 5.8 cm above carina. 2. Interval increase in asymmetric bilateral edema/infiltrates.   Electronically Signed   By: Arne Cleveland M.D.   On: 06/24/2014 11:38   Dg Abd Portable 1v  06/24/2014   CLINICAL DATA:  OG tube placement.  EXAM: PORTABLE ABDOMEN - 1 VIEW  COMPARISON:  None.  FINDINGS: The OG tube tip is in the  distal stomach. No abnormal dilated loops of bowel. No suspicious radiopaque foreign bodies are soft tissue calcifications. Calcified atherosclerotic disease involves the abdominal aorta and its branches.  IMPRESSION: 1. OG tube tip is in satisfactory position within the distal stomach.   Electronically Signed   By: Kerby Moors M.D.   On: 06/24/2014 20:59    Microbiology: Recent Results (from the past 240 hour(s))  MRSA PCR SCREENING     Status: None   Collection Time    06/24/14  2:18 PM      Result Value Ref Range Status   MRSA by PCR NEGATIVE  NEGATIVE Final   Comment:            The GeneXpert MRSA Assay (FDA     approved for NASAL specimens     only), is one component of a     comprehensive MRSA colonization     surveillance program. It is not     intended to diagnose MRSA     infection nor to  guide or     monitor treatment for     MRSA infections.  CULTURE, BLOOD (ROUTINE X 2)     Status: None   Collection Time    06/24/14  3:17 PM      Result Value Ref Range Status   Specimen Description BLOOD LEFT ARM   Final   Special Requests BOTTLES DRAWN AEROBIC AND ANAEROBIC 10CC   Final   Culture  Setup Time     Final   Value: 06/24/2014 22:50     Performed at [facility]   Culture     Final   Value:        BLOOD CULTURE RECEIVED NO GROWTH TO DATE CULTURE WILL BE HELD FOR 5 DAYS BEFORE ISSUING A FINAL NEGATIVE REPORT     Performed at [facility]   Report Status PENDING   Incomplete  CULTURE, RESPIRATORY (NON-EXPECTORATED)     Status: None   Collection Time    06/24/14  3:35 PM      Result Value Ref Range Status   Specimen Description TRACHEAL ASPIRATE   Final   Special Requests Normal   Final   Gram Stain     Final   Value: RARE WBC PRESENT, PREDOMINANTLY MONONUCLEAR     NO SQUAMOUS EPITHELIAL CELLS SEEN     MODERATE GRAM POSITIVE COCCI IN PAIRS     Performed at [facility]   Culture     Final   Value: Non-Pathogenic Oropharyngeal-type Flora Isolated.     Performed at [facility]   Report Status 06/27/2014 FINAL   Final  RESPIRATORY VIRUS PANEL     Status: None   Collection Time    06/24/14  3:35 PM      Result Value Ref Range Status   Source - RVPAN TRACHEAL ASPIRATE   Final   Respiratory Syncytial Virus A NOT DETECTED   Final   Respiratory Syncytial Virus B NOT DETECTED   Final   Influenza A NOT DETECTED   Final   Influenza B NOT DETECTED   Final   Parainfluenza 1 NOT DETECTED   Final   Parainfluenza 2 NOT DETECTED   Final   Parainfluenza 3 NOT DETECTED   Final   Metapneumovirus NOT DETECTED   Final   Rhinovirus NOT DETECTED   Final   Adenovirus NOT DETECTED   Final   Influenza A H1 NOT DETECTED   Final   Influenza A  H3 NOT DETECTED   Final   Comment: (NOTE)           Normal Reference Range for each Analyte: NOT DETECTED      Testing performed using the Luminex xTAG Respiratory Viral Panel test     kit.     This test was developed and its performance characteristics determined     by [facility]. It has not been cleared or approved by the [place]     Food and Drug Administration. This test is used for clinical purposes.     It should not be regarded as investigational or for research. This     laboratory is certified under the [place] (CLIA) as qualified to perform high complexity     clinical laboratory testing.     Performed at [facility]     Status: None   Collection Time    06/24/14  5:16 PM      Result Value Ref Range Status   Specimen Description URINE, CATHETERIZED   Final   Special Requests Immunocompromised   Final   Culture  Setup Time     Final   Value: 06/25/2014 02:14     Performed at [facility]     Final   Value: NO GROWTH     Performed at [facility]   Culture     Final   Value: NO GROWTH     Performed at [facility]   Report Status 06/26/2014 FINAL   Final  CULTURE, BLOOD (ROUTINE X 2)     Status: None   Collection Time    06/24/14  5:31 PM      Result Value Ref Range Status   Specimen Description BLOOD RIGHT HAND   Final   Special Requests BOTTLES DRAWN AEROBIC ONLY 8CC   Final   Culture  Setup Time     Final   Value: 06/25/2014 01:49     Performed at [facility]   Culture     Final   Value:        BLOOD CULTURE RECEIVED NO GROWTH TO DATE CULTURE WILL BE HELD FOR 5 DAYS BEFORE ISSUING A FINAL NEGATIVE REPORT     Performed at [facility]   Report Status PENDING   Incomplete     Labs: Basic Metabolic Panel:  Recent Labs Lab 06/24/14 1114 06/24/14 1532 06/25/14 0205 06/25/14 1045 06/26/14 0113 06/27/14 0250 06/27/14 1830 06/28/14 0400 06/29/14 0351  NA 141 139 138  --  134* 132* 134* 136* 139  K 4.7 5.1 4.6  --  4.4 4.0 4.9 4.9 4.0   CL 97 98 98  --  96 94* 96 99 96  CO2  --  24 28  --  _0 GLUCOSE 268* 253* 190*  --  383* 265* 405* 327* 212*  BUN _1 --  35* 40* 43* 41* 40*  CREATININE 1.50* 1.28 1.77*  --  1.99* 1.78* 1.63* 1.60* 1.60*  CALCIUM  --  8.9 8.8  --  8.3* 8.7 8.8 8.9 9.2  MG  --  1.4*  --  1.3*  --  2.1  --   --   --   PHOS  --   --   --  3.0  --  2.7  --   --   --  Liver Function Tests:  Recent Labs Lab 06/24/14 1051 06/24/14 1532 06/26/14 0113  AST 46* 59* 23  ALT 26 34 21  ALKPHOS 52 49 43  BILITOT 0.6 0.6 0.4  PROT 8.5* 7.7 6.3  ALBUMIN 3.5 3.2* 2.5*   No results found for this basename: LIPASE, AMYLASE,  in the last 168 hours No results found for this basename: AMMONIA,  in the last 168 hours CBC:  Recent Labs Lab 06/24/14 1051  06/24/14 1532 06/25/14 0205 06/26/14 0113 06/27/14 0250 06/28/14 0400 06/29/14 0351  WBC 9.4  --  7.3 7.9 13.5* 14.0* 10.9* 9.5  NEUTROABS 4.4  --  6.6  --  12.4*  --   --   --   HGB 15.6  < > 14.6 13.0 13.5 13.4 13.0 13.1  HCT 47.9  < > 43.5 39.6 41.2 40.1 39.4 40.3  MCV 93.0  --  91.2 89.8 88.6 88.7 89.5 90.2  PLT 232  --  203 200 209 209 195 196  < > = values in this interval not displayed. Cardiac Enzymes:  Recent Labs Lab 06/24/14 1051 06/24/14 1532 06/24/14 2220 06/25/14 0638  CKTOTAL  --  150  --   --   CKMB  --  5.3*  --   --   TROPONINI <0.30 0.32* 2.52* 1.23*   BNP: BNP (last 3 results)  Recent Labs  06/24/14 1051 06/24/14 1532 06/28/14 0400  PROBNP 4355.0* 5446.0* 2134.0*   CBG:  Recent Labs Lab 06/28/14 2050 06/29/14 0013 06/29/14 0406 06/29/14 0749 06/29/14 1130  GLUCAP 180* 248* 217* 136* 204*       Signed:  Debbe Odea, MD  [facility] 06/29/2014, 12:49 PM

## 2014-06-29 NOTE — Progress Notes (Signed)
Patient ID: [name], male   DOB: October 22, 1936, 78 y.o.   MRN: [id number]  Subjective: Walked in halls, breathing good.  Wants to go home.     Objective: Filed Vitals:   06/28/14 1450 06/28/14 1635 06/28/14 2039 06/29/14 0359  BP: 121/52 153/85 143/61 122/66  Pulse: 102 79 82 97  Temp: 98 F (36.7 C)  97.3 F (36.3 C) 98.3 F (36.8 C)  TempSrc: Oral  Oral Oral  Resp: 18  18 18   Height:      Weight:    230 lb 13.2 oz (104.7 kg)  SpO2: 98%  93% 95%   Weight change: -5 lb 1.1 oz (-2.3 kg)  Intake/Output Summary (Last 24 hours) at 06/29/14 0828 Last data filed at 06/29/14 0300  Gross per 24 hour  Intake    720 ml  Output   2050 ml  Net  -1330 ml   Net neg 2.12 L  General: Alert, awake, oriented x3, in no acute distress Neck:  JVP is normal Heart: irregular rate and rhythm, without murmurs, rubs, gallops.  Lungs: Clear to auscultation.  No rales or wheezes. Exemities:  1-2+ edema.  (L greated than R) Neuro: Grossly intact, nonfocal.  Tele:  Afib  70s   Lab Results: Results for orders placed during the hospital encounter of 06/24/14 (from the past 24 hour(s))  GLUCOSE, CAPILLARY     Status: Abnormal   Collection Time    06/28/14 11:22 AM      Result Value Ref Range   Glucose-Capillary 140 (*) 70 - 99 mg/dL  GLUCOSE, CAPILLARY     Status: Abnormal   Collection Time    06/28/14  4:05 PM      Result Value Ref Range   Glucose-Capillary 357 (*) 70 - 99 mg/dL  GLUCOSE, CAPILLARY     Status: Abnormal   Collection Time    06/28/14  8:50 PM      Result Value Ref Range   Glucose-Capillary 180 (*) 70 - 99 mg/dL  GLUCOSE, CAPILLARY     Status: Abnormal   Collection Time    06/29/14 12:13 AM      Result Value Ref Range   Glucose-Capillary 248 (*) 70 - 99 mg/dL  CBC     Status: None   Collection Time    06/29/14  3:51 AM      Result Value Ref Range   WBC 9.5  4.0 - 10.5 K/uL   RBC 4.47  4.22 - 5.81 MIL/uL   Hemoglobin 13.1  13.0 - 17.0 g/dL   HCT 40.3  39.0 - 52.0  %   MCV 90.2  78.0 - 100.0 fL   MCH 29.3  26.0 - 34.0 pg   MCHC 32.5  30.0 - 36.0 g/dL   RDW 15.2  11.5 - 15.5 %   Platelets 196  150 - 400 K/uL  BASIC METABOLIC PANEL     Status: Abnormal   Collection Time    06/29/14  3:51 AM      Result Value Ref Range   Sodium 139  137 - 147 mEq/L   Potassium 4.0  3.7 - 5.3 mEq/L   Chloride 96  96 - 112 mEq/L   CO2 31  19 - 32 mEq/L   Glucose, Bld 212 (*) 70 - 99 mg/dL   BUN 40 (*) 6 - 23 mg/dL   Creatinine, Ser 1.60 (*) 0.50 - 1.35 mg/dL   Calcium 9.2  8.4 - 10.5 mg/dL   GFR  calc non Af Amer 41 (*) >90 mL/min   GFR calc Af Amer 47 (*) >90 mL/min   Anion gap 12  5 - 15  GLUCOSE, CAPILLARY     Status: Abnormal   Collection Time    06/29/14  4:06 AM      Result Value Ref Range   Glucose-Capillary 217 (*) 70 - 99 mg/dL  GLUCOSE, CAPILLARY     Status: Abnormal   Collection Time    06/29/14  7:49 AM      Result Value Ref Range   Glucose-Capillary 136 (*) 70 - 99 mg/dL    Studies/Results: No results found.  Medications: Reviewed  Assessment/Plan:  1.  Afib: Rate control.  I do not think he is a candidate for anticoagulation unless he demonstrates compliance  2.  Acute on chronic diastolic CHF: No JVD but still has peripheral edema.  He is very eager to go home.  Think this would be reasonable but would increase home Lasix dose to 40 mg bid.   3.  NSTEMIL  Prob demand ischemia.  4.  CKD: Creatinine stable.   LOS: 5 days   Loralie Champagne 06/29/2014, 8:28 AM

## 2014-06-29 NOTE — Progress Notes (Signed)
Physical Therapy Treatment Patient Details Name: [name] MRN: [id number] DOB: May 21, 1936 Today's Date: 2014/07/07    History of Present Illness 78 y/o M, current smoker, with PMH of HTN, CAD s/p MI, PVD & bipolar disorder who presented to [facility] ER on 6/28 with hypercarbic respiratory failure & hypertensive emergency.  Pt intubated and then extubated 06/26/14.     PT Comments    Patient making improvements with mobility.  Does not have 24 hour assist available.  Recommend HHPT for mobility and balance therapy.    Follow Up Recommendations  Home health PT;Supervision - Intermittent     Equipment Recommendations  None recommended by PT    Recommendations for Other Services OT consult     Precautions / Restrictions Precautions Precautions: Fall Restrictions Weight Bearing Restrictions: No    Mobility  Bed Mobility                  Transfers Overall transfer level: Modified independent Equipment used: None             General transfer comment: Increased time  Ambulation/Gait Ambulation/Gait assistance: Min guard Ambulation Distance (Feet): 180 Feet Assistive device: None Gait Pattern/deviations: Step-through pattern;Decreased stride length;Shuffle Gait velocity: decreased; reports its his baseline  Gait velocity interpretation: Below normal speed for age/gender General Gait Details: Patient with unsteady gait, staggering at times.  Able to self-correct.  Decreased balance with head turns, especially when looking upward.  Agree with need to use RW at discharge - discussed with patient and his daughter.   Stairs            Wheelchair Mobility    Modified Rankin (Stroke Patients Only)       Balance Overall balance assessment: Needs assistance               Single Leg Stance - Right Leg: 2 Single Leg Stance - Left Leg: 4         High level balance activites: Direction changes;Turns;Sudden stops;Head turns (stepping over  obstacles) High Level Balance Comments: Decreased balance with head turns, especially looking upward.    Cognition Arousal/Alertness: Awake/alert Behavior During Therapy: WFL for tasks assessed/performed Overall Cognitive Status: Within Functional Limits for tasks assessed                      Exercises      General Comments        Pertinent Vitals/Pain     Home Living                      Prior Function            PT Goals (current goals can now be found in the care plan section) Progress towards PT goals: Progressing toward goals    Frequency  Min 3X/week    PT Plan Current plan remains appropriate    Co-evaluation             End of Session Equipment Utilized During Treatment: Gait belt Activity Tolerance: Patient tolerated treatment well Patient left: in bed;with call bell/phone within reach;with family/visitor present (sitting EOB)     Time: 8250-5397 PT Time Calculation (min): 24 min  Charges:  $Gait Training: 23-37 mins                    G Codes:      Despina Pole 07-07-2014, 1:58 PM Carita Pian. Sanjuana Kava, [place] Pager [phone]

## 2014-06-29 NOTE — Progress Notes (Signed)
1330 discharge instructions  And prescription given to pt and daughter. Verbalized understanding

## 2014-06-29 NOTE — Progress Notes (Signed)
The patient did not have any complaints of pain overnight and his VS remained stable.

## 2014-06-29 NOTE — Discharge Summary (Signed)
Physician Discharge Summary  [name] NIO:[id number] DOB: 01-May-1936 DOA: 06/24/2014  PCP: Jilda Panda, MD  Admit date: 06/24/2014 Discharge date: 06/29/2014  Time spent: >45 minutes  Recommendations for Outpatient Follow-up:  1. Home with [facility] for CHF management 2. Needs PFTs as outpt once euvolemic 3. Need to f/u with Cardiology and be assessed for long term anticoagulation for A-fib 4. NEEDS BMET IN 1 WK AS DOSE OF LASIX HAS BEEN INCREASED  Discharge Diagnoses:  Principal Problem:   Acute respiratory failure- suspected COPD exacerbation Active Problems:   DM   HYPERTENSION   CAD   PVD   Pulmonary edema   Atrial fibrillation   Tobacco abuse   Diastolic CHF, chronic   Discharge Condition: stable  Diet recommendation: low sodium,low fat, diabetic diet  Filed Weights   06/27/14 0522 06/28/14 0500 06/29/14 0359  Weight: 105.416 kg (232 lb 6.4 oz) 107 kg (235 lb 14.3 oz) 104.7 kg (230 lb 13.2 oz)    History of present illness:  77 y/o M, current smoker, per daughter hx of non compliance, daily bourbon intake,with PMH of HTN, CAD s/p MI, PVD & bipolar disorder who presented to [facility] ER on 6/28 from home with complaints of shortness of breath. Per daughter several months of cough, few days of fatigue and increased edema but refused to see doctor. EMS found patient to be tripoding, unable to speak with bilateral wheezing. Patient was medicated with solu-medrol 125 mg and breathing treatments x 2. He had progressive decline with reduced mental status and increased WOB requiring intubation in ER. PCCM consulted for ICU admit.   When alert, admits to chronic pedal edema for which he takes Furosemide. PCP is Dr Mellody Drown and the [place] and he states he sees them regularly.      Hospital Course:  Principal Problem:  Acute respiratory failure  - suspect COPD exacerbation although he has no official diagnosis  - Per ICU notes, pt was wheezing and pulm wedge pressure was not elevated  therefore less likely to be CHF  (A) COPD??- tapering oral prednisone - per pulm cannot get PFTs while he is fluid overloaded as it would effect his DLCO- will need to order as outpt or prior to d/c when he is euvolemic   (B) CHF-acute on chronic diastolic grade 3  - currently on room air  - will set him up with CHF HHRN  - have asked CHF team to see him due to the severity of his diastolic dysfunction  - maintain BP control - cont CCB and BB  - Dr Aundra Dubin recommends increasing Lasix from 40 daily to 40 BID at home  ECHO- 06/24/14  - Left ventricle: The cavity size was normal. There was moderate concentric hypertrophy. Systolic function was normal. The estimated ejection fraction was in the range of 50% to 55%. Hypokinesis of the inferior myocardium. Doppler parameters are consistent with a reversible restrictive pattern, indicative of decreased left ventricular diastolic compliance and/or increased left atrial pressure (grade 3 diastolic dysfunction). - Left atrium: The atrium was moderately dilated.  Active Problems:  NSTEMI  -cardiology suspects demand ischemia in related to respiratory failure- OK with cards to stop Heparin   Atrial fibrillation- chronic  - rate controlled  -noted that he is not on anticoagulation - cardiology advised that he need to f/u with Dr Burt Knack to decide upon anticoagulation   Tobacco abuse  - has been advised by Dr Burt Knack to quit many times but has not  - have explained the  related between smoking, PVD and COPD again today in presence of his daughter and have advised him to quit smoking   Pedal edema - suspect partly due to venous stasis as despite giving Lasix it has gotten worse overnight-- as noted above lungs are CTA b/l  - recommend TEDS  DM  - uncontrolled- cont home doses on insulin on d/c  HYPERTENSION- with HTN urgency on admission  - cont CCB and BB   CAD/ PVD  - cont ASA and pletal   BPH  - cont Flomax and  proscar   Procedures: OETT 6/28 >>> 6/29  R IJ CVL 6/29 >>>  CULTURES:  Sputum 6/28 >>> mod gram + cocci in pairs, non-path oral flora  Resp virus 6/28 >>>  Urine 6/28 >>> neg  Blood culture 6/28 >>> ngtd >>>  Urine Tox 6/28 >>> + THC    Consultations:  pulm critical care  [facility]  Discharge Exam: Filed Vitals:   06/29/14 1035  BP: 154/87  Pulse: 97  Temp:   Resp: 18   General: No acute respiratory distress  Lungs: mild crackles at bases 98% on RA  Cardiovascular: Regular rate and rhythm without murmur gallop or rub normal S1 and S2  Abdomen: Nontender, nondistended, soft, bowel sounds positive, no rebound, no ascites, no appreciable mass  Extremities: No significant cyanosis, clubbing,- 2 + edema b/l legs  Discharge Instructions You were cared for by a hospitalist during your hospital stay. If you have any questions about your discharge medications or the care you received while you were in the hospital after you are discharged, you can call the unit and asked to speak with the hospitalist on call if the hospitalist that took care of you is not available. Once you are discharged, your primary care physician will handle any further medical issues. Please note that NO REFILLS for any discharge medications will be authorized once you are discharged, as it is imperative that you return to your primary care physician (or establish a relationship with a primary care physician if you do not have one) for your aftercare needs so that they can reassess your need for medications and monitor your lab values.      Discharge Instructions   Diet - low sodium heart healthy    Complete by:  As directed   Low fat and carb modified     Diet - low sodium heart healthy    Complete by:  As directed      Increase activity slowly    Complete by:  As directed      Increase activity slowly    Complete by:  As directed             Medication List    STOP taking these  medications                  TAKE these medications       albuterol 108 (90 BASE) MCG/ACT inhaler  Commonly known as:  PROVENTIL HFA;VENTOLIN HFA  Inhale 2 puffs into the lungs every 6 (six) hours as needed for shortness of breath.     aspirin 325 MG EC tablet  Take 325 mg by mouth daily.     atorvastatin 40 MG tablet  Commonly known as:  LIPITOR  Take 20 mg by mouth at bedtime.     carvedilol 12.5 MG tablet  Commonly known as:  COREG  Take 12.5 mg by mouth 2 (two) times daily with a meal.  cilostazol 50 MG tablet  Commonly known as:  PLETAL  Take 50 mg by mouth 2 (two) times daily.     cyanocobalamin 1000 MCG tablet  Take 1,000 mcg by mouth daily.     DAILY MULTIPLE VITAMINS PO  Take 1 tablet by mouth once a day.     diltiazem 120 MG 24 hr tablet  Commonly known as:  CARDIZEM LA  Take 2 tablets (240 mg total) by mouth daily.     divalproex 250 MG DR tablet  Commonly known as:  DEPAKOTE  Take 250 mg by mouth at bedtime. 6 tablets everyday.     finasteride 5 MG tablet  Commonly known as:  PROSCAR  Take 5 mg by mouth daily.     furosemide 40 MG tablet  Commonly known as:  LASIX  Take 1 tablet (40 mg total) by mouth 2 (two) times daily.     gabapentin 300 MG capsule  Commonly known as:  NEURONTIN  Take 300 mg by mouth 3 (three) times daily.     insulin glargine 100 UNIT/ML injection  Commonly known as:  LANTUS  Inject 40 Units into the skin daily.     insulin regular 100 units/mL injection  Commonly known as:  NOVOLIN R,HUMULIN R  Inject 8 Units into the skin 2 (two) times daily before a meal.     lisinopril 40 MG tablet  Commonly known as:  PRINIVIL,ZESTRIL  40 mg daily.     nicotine 14 mg/24hr patch  Commonly known as:  NICODERM CQ - dosed in mg/24 hours  Place 1 patch (14 mg total) onto the skin daily.     pantoprazole 40 MG tablet  Commonly known as:  PROTONIX  Take 40 mg by mouth daily.     predniSONE 20 MG tablet  Commonly known as:   DELTASONE  Take 1 tablet (20 mg total) by mouth daily with breakfast.     sildenafil 100 MG tablet  Commonly known as:  VIAGRA  Take 100 mg by mouth daily as needed for erectile dysfunction. Take as directed as needed 1 hour before sexual activity.     tamsulosin 0.4 MG Caps capsule  Commonly known as:  FLOMAX  Take 0.4 mg by mouth daily.     travoprost (benzalkonium) 0.004 % ophthalmic solution  Commonly known as:  TRAVATAN  Place 1 drop into both eyes at bedtime.       Allergies  Allergen Reactions  . Penicillins Other (See Comments)     hands and feet edema   Follow-up Information   Follow up with [facility].   Contact information:   [id number]  [place], [place] [address] [phone]      Follow up with Beacher May, MD.   Specialty:  Internal Medicine   Contact information:   [place] [address] [phone]       Follow up with Sherren Mocha, MD. (Our office will call you for appt. )    Specialty:  Cardiology   Contact information:   [address] [address] [phone]       Follow up with Jilda Panda, MD. Schedule an appointment as soon as possible for a visit in 1 week.   Specialty:  Internal Medicine   Contact information:   [address] [place] [address] [phone]        The results of significant diagnostics from this hospitalization (including imaging, microbiology, ancillary  and laboratory) are listed below for reference.    Significant Diagnostic Studies: Dg Chest Port 1 View  06/27/2014   CLINICAL DATA:  Pulmonary edema.  EXAM: PORTABLE CHEST - 1 VIEW  COMPARISON:  Multiple recent previous exams.  FINDINGS: The diffuse interstitial and basilar alveolar opacity persists and is slightly improved in the interval. The cardio pericardial silhouette is enlarged. Right IJ central line has been removed. Telemetry leads overlie the chest.  IMPRESSION: Interval  improvement in pulmonary edema pattern with removal of the right IJ central line.   Electronically Signed   By: Misty Stanley M.D.   On: 06/27/2014 07:39   Dg Chest Port 1 View  06/26/2014   CLINICAL DATA:  Followup edema  EXAM: PORTABLE CHEST - 1 VIEW  COMPARISON:  06/25/2014  FINDINGS: Endotracheal tube removed.  Central venous catheter tip in the SVC.  Interval improvement in pulmonary edema. Improvement in bibasilar atelectasis which remains. Small right effusion has improved.  IMPRESSION: Significant improvement in pulmonary edema. Mild bibasilar atelectasis remains.  Endotracheal tube removed.   Electronically Signed   By: Franchot Gallo M.D.   On: 06/26/2014 07:26   Dg Chest Port 1 View  06/25/2014   CLINICAL DATA:  Central line placement.  EXAM: PORTABLE CHEST - 1 VIEW  COMPARISON:  06/25/2014.  FINDINGS: The endotracheal tube and NG tubes are stable. There is a new right IJ central venous catheter with its tip at the level of the carina in the region of the mid SVC. The heart and lungs are stable.  IMPRESSION: Right IJ central venous catheter in good position with its tip in the mid SVC. No complicating features such as pneumothorax.  Stable mild cardiac enlargement, pulmonary vascular congestion and possible mild edema.   Electronically Signed   By: Kalman Jewels M.D.   On: 06/25/2014 12:27   Dg Chest Port 1 View  06/25/2014   CLINICAL DATA:  Evaluate endotracheal tube, airspace disease  EXAM: PORTABLE CHEST - 1 VIEW  COMPARISON:  06/24/2014; 11/29/2012; 07/09/2011  FINDINGS: Grossly unchanged enlarged cardiac silhouette and mediastinal contours given persistently reduced lung volumes and patient rotation. Atherosclerotic plaque within the thoracic aorta. Stable position of support apparatus. No pneumothorax. Worsening bilateral mid and lower lung heterogeneous opacities, right greater than left. Pulmonary vasculature remains indistinct with cephalization of flow. Trace bilateral effusions are  not excluded, right greater than left. Grossly unchanged bones.  IMPRESSION: 1.  Stable positioning of support apparatus.  No pneumothorax. 2. Findings most suggestive of worsening pulmonary edema and bibasilar opacities, atelectasis versus infiltrate.   Electronically Signed   By: Sandi Mariscal M.D.   On: 06/25/2014 07:55   Dg Chest Portable 1 View  06/24/2014   CLINICAL DATA:  SHORTNESS OF BREATH  EXAM: PORTABLE CHEST - 1 VIEW  COMPARISON:  11/29/2012  FINDINGS: Endotracheal tube has been placed, tip approximately 5.8 cm above carina. Atheromatous aorta. Heart size upper limits normal. Moderate interstitial and airspace opacities in a predominantly perihilar and infrahilar distribution, increased since previous exam, right greater than left. No effusion. Visualized skeletal structures are unremarkable.  IMPRESSION: 1. Endotracheal tube tip 5.8 cm above carina. 2. Interval increase in asymmetric bilateral edema/infiltrates.   Electronically Signed   By: Arne Cleveland M.D.   On: 06/24/2014 11:38   Dg Abd Portable 1v  06/24/2014   CLINICAL DATA:  OG tube placement.  EXAM: PORTABLE ABDOMEN - 1 VIEW  COMPARISON:  None.  FINDINGS: The OG tube tip is in the  distal stomach. No abnormal dilated loops of bowel. No suspicious radiopaque foreign bodies are soft tissue calcifications. Calcified atherosclerotic disease involves the abdominal aorta and its branches.  IMPRESSION: 1. OG tube tip is in satisfactory position within the distal stomach.   Electronically Signed   By: Kerby Moors M.D.   On: 06/24/2014 20:59    Microbiology: Recent Results (from the past 240 hour(s))  MRSA PCR SCREENING     Status: None   Collection Time    06/24/14  2:18 PM      Result Value Ref Range Status   MRSA by PCR NEGATIVE  NEGATIVE Final   Comment:            The GeneXpert MRSA Assay (FDA     approved for NASAL specimens     only), is one component of a     comprehensive MRSA colonization     surveillance program. It is  not     intended to diagnose MRSA     infection nor to guide or     monitor treatment for     MRSA infections.  CULTURE, BLOOD (ROUTINE X 2)     Status: None   Collection Time    06/24/14  3:17 PM      Result Value Ref Range Status   Specimen Description BLOOD LEFT ARM   Final   Special Requests BOTTLES DRAWN AEROBIC AND ANAEROBIC 10CC   Final   Culture  Setup Time     Final   Value: 06/24/2014 22:50     Performed at [facility]   Culture     Final   Value:        BLOOD CULTURE RECEIVED NO GROWTH TO DATE CULTURE WILL BE HELD FOR 5 DAYS BEFORE ISSUING A FINAL NEGATIVE REPORT     Performed at [facility]   Report Status PENDING   Incomplete  CULTURE, RESPIRATORY (NON-EXPECTORATED)     Status: None   Collection Time    06/24/14  3:35 PM      Result Value Ref Range Status   Specimen Description TRACHEAL ASPIRATE   Final   Special Requests Normal   Final   Gram Stain     Final   Value: RARE WBC PRESENT, PREDOMINANTLY MONONUCLEAR     NO SQUAMOUS EPITHELIAL CELLS SEEN     MODERATE GRAM POSITIVE COCCI IN PAIRS     Performed at [facility]   Culture     Final   Value: Non-Pathogenic Oropharyngeal-type Flora Isolated.     Performed at [facility]   Report Status 06/27/2014 FINAL   Final  RESPIRATORY VIRUS PANEL     Status: None   Collection Time    06/24/14  3:35 PM      Result Value Ref Range Status   Source - RVPAN TRACHEAL ASPIRATE   Final   Respiratory Syncytial Virus A NOT DETECTED   Final   Respiratory Syncytial Virus B NOT DETECTED   Final   Influenza A NOT DETECTED   Final   Influenza B NOT DETECTED   Final   Parainfluenza 1 NOT DETECTED   Final   Parainfluenza 2 NOT DETECTED   Final   Parainfluenza 3 NOT DETECTED   Final   Metapneumovirus NOT DETECTED   Final   Rhinovirus NOT DETECTED   Final   Adenovirus NOT DETECTED   Final   Influenza A H1 NOT DETECTED   Final   Influenza A  H3 NOT DETECTED   Final   Comment: (NOTE)            Normal Reference Range for each Analyte: NOT DETECTED     Testing performed using the Luminex xTAG Respiratory Viral Panel test     kit.     This test was developed and its performance characteristics determined     by [facility]. It has not been cleared or approved by the [place]     Food and Drug Administration. This test is used for clinical purposes.     It should not be regarded as investigational or for research. This     laboratory is certified under the [place] (CLIA) as qualified to perform high complexity     clinical laboratory testing.     Performed at [facility]     Status: None   Collection Time    06/24/14  5:16 PM      Result Value Ref Range Status   Specimen Description URINE, CATHETERIZED   Final   Special Requests Immunocompromised   Final   Culture  Setup Time     Final   Value: 06/25/2014 02:14     Performed at [facility]     Final   Value: NO GROWTH     Performed at [facility]   Culture     Final   Value: NO GROWTH     Performed at [facility]   Report Status 06/26/2014 FINAL   Final  CULTURE, BLOOD (ROUTINE X 2)     Status: None   Collection Time    06/24/14  5:31 PM      Result Value Ref Range Status   Specimen Description BLOOD RIGHT HAND   Final   Special Requests BOTTLES DRAWN AEROBIC ONLY 8CC   Final   Culture  Setup Time     Final   Value: 06/25/2014 01:49     Performed at [facility]   Culture     Final   Value:        BLOOD CULTURE RECEIVED NO GROWTH TO DATE CULTURE WILL BE HELD FOR 5 DAYS BEFORE ISSUING A FINAL NEGATIVE REPORT     Performed at [facility]   Report Status PENDING   Incomplete     Labs: Basic Metabolic Panel:  Recent Labs Lab 06/24/14 1114 06/24/14 1532 06/25/14 0205 06/25/14 1045 06/26/14 0113 06/27/14 0250 06/27/14 1830 06/28/14 0400 06/29/14 0351  NA 141 139 138  --  134*  132* 134* 136* 139  K 4.7 5.1 4.6  --  4.4 4.0 4.9 4.9 4.0  CL 97 98 98  --  96 94* 96 99 96  CO2  --  24 28  --  26 26 27 27 31   GLUCOSE 268* 253* 190*  --  383* 265* 405* 327* 212*  BUN 13 15 20   --  35* 40* 43* 41* 40*  CREATININE 1.50* 1.28 1.77*  --  1.99* 1.78* 1.63* 1.60* 1.60*  CALCIUM  --  8.9 8.8  --  8.3* 8.7 8.8 8.9 9.2  MG  --  1.4*  --  1.3*  --  2.1  --   --   --   PHOS  --   --   --  3.0  --  2.7  --   --   --  Liver Function Tests:  Recent Labs Lab 06/24/14 1051 06/24/14 1532 06/26/14 0113  AST 46* 59* 23  ALT 26 34 21  ALKPHOS 52 49 43  BILITOT 0.6 0.6 0.4  PROT 8.5* 7.7 6.3  ALBUMIN 3.5 3.2* 2.5*   No results found for this basename: LIPASE, AMYLASE,  in the last 168 hours No results found for this basename: AMMONIA,  in the last 168 hours CBC:  Recent Labs Lab 06/24/14 1051  06/24/14 1532 06/25/14 0205 06/26/14 0113 06/27/14 0250 06/28/14 0400 06/29/14 0351  WBC 9.4  --  7.3 7.9 13.5* 14.0* 10.9* 9.5  NEUTROABS 4.4  --  6.6  --  12.4*  --   --   --   HGB 15.6  < > 14.6 13.0 13.5 13.4 13.0 13.1  HCT 47.9  < > 43.5 39.6 41.2 40.1 39.4 40.3  MCV 93.0  --  91.2 89.8 88.6 88.7 89.5 90.2  PLT 232  --  203 200 209 209 195 196  < > = values in this interval not displayed. Cardiac Enzymes:  Recent Labs Lab 06/24/14 1051 06/24/14 1532 06/24/14 2220 06/25/14 0638  CKTOTAL  --  150  --   --   CKMB  --  5.3*  --   --   TROPONINI <0.30 0.32* 2.52* 1.23*   BNP: BNP (last 3 results)  Recent Labs  06/24/14 1051 06/24/14 1532 06/28/14 0400  PROBNP 4355.0* 5446.0* 2134.0*   CBG:  Recent Labs Lab 06/28/14 2050 06/29/14 0013 06/29/14 0406 06/29/14 0749 06/29/14 1130  GLUCAP 180* 248* 217* 136* 204*       Signed:  Debbe Odea, MD  [facility] 06/29/2014, 12:45 PM

## 2014-06-30 LAB — CULTURE, BLOOD (ROUTINE X 2): Culture: NO GROWTH

## 2014-07-01 LAB — CULTURE, BLOOD (ROUTINE X 2): Culture: NO GROWTH

## 2014-07-03 ENCOUNTER — Ambulatory Visit (INDEPENDENT_AMBULATORY_CARE_PROVIDER_SITE_OTHER): Payer: PRIVATE HEALTH INSURANCE | Admitting: Nurse Practitioner

## 2014-07-03 ENCOUNTER — Encounter: Payer: Self-pay | Admitting: Nurse Practitioner

## 2014-07-03 VITALS — BP 130/60 | HR 72 | Ht 74.0 in | Wt 228.4 lb

## 2014-07-03 DIAGNOSIS — R0609 Other forms of dyspnea: Secondary | ICD-10-CM

## 2014-07-03 DIAGNOSIS — R0989 Other specified symptoms and signs involving the circulatory and respiratory systems: Secondary | ICD-10-CM

## 2014-07-03 DIAGNOSIS — I5032 Chronic diastolic (congestive) heart failure: Secondary | ICD-10-CM

## 2014-07-03 DIAGNOSIS — R0600 Dyspnea, unspecified: Secondary | ICD-10-CM

## 2014-07-03 LAB — CBC
HCT: 40.8 % (ref 39.0–52.0)
Hemoglobin: 13 g/dL (ref 13.0–17.0)
MCHC: 31.8 g/dL (ref 30.0–36.0)
MCV: 92.4 fl (ref 78.0–100.0)
Platelets: 208 10*3/uL (ref 150.0–400.0)
RBC: 4.42 Mil/uL (ref 4.22–5.81)
RDW: 16.2 % — ABNORMAL HIGH (ref 11.5–15.5)
WBC: 9.9 10*3/uL (ref 4.0–10.5)

## 2014-07-03 LAB — BASIC METABOLIC PANEL
BUN: 46 mg/dL — ABNORMAL HIGH (ref 6–23)
CO2: 32 mEq/L (ref 19–32)
Calcium: 8.6 mg/dL (ref 8.4–10.5)
Chloride: 96 mEq/L (ref 96–112)
Creatinine, Ser: 1.8 mg/dL — ABNORMAL HIGH (ref 0.4–1.5)
GFR: 46.54 mL/min — ABNORMAL LOW (ref >60.00)
Glucose, Bld: 362 mg/dL — ABNORMAL HIGH (ref 70–99)
Potassium: 4.1 mEq/L (ref 3.5–5.1)
Sodium: 136 mEq/L (ref 135–145)

## 2014-07-03 NOTE — Patient Instructions (Addendum)
Ask your primary doctor about how long to stay on Prednisone  Take all of your medicine bottles to all of your visits - to the [place] and to your PCP  Really try to limit your salt use - this will help with your swelling  See Dr. Burt Knack in 6 weeks  Call the [place] office at [phone] if you have any questions, problems or concerns.

## 2014-07-03 NOTE — Progress Notes (Addendum)
[name] Date of Birth: 06-Sep-1936 Medical Record #[id number]  History of Present Illness: [name] is seen back today for a post hospital visit. Seen for [name]. He has a history of CAD with past NSTEMI with PCI in 2011, RBBB, type 2 DM, CKD, GERD, BPH, depression, probable COPD, PVD with past left femoropopliteal bypass in 2979, HTN, diastolic heart failure and alcohol/tobacco abuse.   Last seen here in September of 2014 by [name].  Most recently admitted with cough, fatigue and increased edema. Would not go to the doctor. Long standing issues with noncompliance. Had respiratory failure and was intubated. Suspected COPD exacerbation. Had acute on chronic diastolic HF, referral to heart failure clinic - had lasix increased. EF 50 to 55% Had positive troponin - felt to be due to demand ischemia related to respiratory failure. He was in atrial fib - advised that cardiology would determine about need for anticoagulation - noted by [name] that he would need to demonstrate compliance prior to starting.   Comes back today. Here alone. Very confused about his medicines -  Not able to tell me what he is taking or why. Says he is not smoking.  Says he quit when he got admitted. Using the patches. Says he is not drinking - maybe a beer or two - but nothing like what he was drinking. No more bourbon. Still probably getting too much salt. Recommended to have PFT's after this exacerbation and needs labs today. Has a nurse coming to visit him - sounds like from [facility]. Sees PCP tomorrow. Notes that he is "100% better".   Current Outpatient Prescriptions  Medication Sig Dispense Refill  . albuterol (PROVENTIL HFA;VENTOLIN HFA) 108 (90 BASE) MCG/ACT inhaler Inhale 2 puffs into the lungs every 6 (six) hours as needed for shortness of breath.      Marland Kitchen aspirin 325 MG EC tablet Take 325 mg by mouth daily.       Marland Kitchen atorvastatin (LIPITOR) 40 MG tablet Take 20 mg by mouth at bedtime.       .  carvedilol (COREG) 12.5 MG tablet Take 12.5 mg by mouth 2 (two) times daily with a meal.       . cilostazol (PLETAL) 50 MG tablet Take 50 mg by mouth 2 (two) times daily.      . cyanocobalamin 1000 MCG tablet Take 1,000 mcg by mouth daily.      Marland Kitchen DAILY MULTIPLE VITAMINS PO Take 1 tablet by mouth once a day.       . diltiazem (CARDIZEM LA) 120 MG 24 hr tablet Take 2 tablets (240 mg total) by mouth daily.  60 tablet  0  . divalproex (DEPAKOTE) 250 MG EC tablet Take 250 mg by mouth at bedtime. 6 tablets everyday.      . finasteride (PROSCAR) 5 MG tablet Take 5 mg by mouth daily.        . furosemide (LASIX) 40 MG tablet Take 1 tablet (40 mg total) by mouth 2 (two) times daily.  30 tablet  0  . gabapentin (NEURONTIN) 300 MG capsule Take 300 mg by mouth 3 (three) times daily.      . insulin glargine (LANTUS) 100 UNIT/ML injection Inject 40 Units into the skin daily.       . insulin regular (HUMULIN R,NOVOLIN R) 100 units/mL injection Inject 8 Units into the skin 2 (two) times daily before a meal.       . lisinopril (PRINIVIL,ZESTRIL) 40 MG tablet 40 mg  daily.       . nicotine (NICODERM CQ - DOSED IN MG/24 HOURS) 14 mg/24hr patch Place 1 patch (14 mg total) onto the skin daily.  14 patch  0  . pantoprazole (PROTONIX) 40 MG tablet Take 40 mg by mouth daily.      . predniSONE (DELTASONE) 20 MG tablet Take 1 tablet (20 mg total) by mouth daily with breakfast.  2 tablet  0  . sildenafil (VIAGRA) 100 MG tablet Take 100 mg by mouth daily as needed for erectile dysfunction. Take as directed as needed 1 hour before sexual activity.      . Tamsulosin HCl (FLOMAX) 0.4 MG CAPS Take 0.4 mg by mouth daily.        . travoprost, benzalkonium, (TRAVATAN) 0.004 % ophthalmic solution Place 1 drop into both eyes at bedtime.         No current facility-administered medications for this visit.    Allergies  Allergen Reactions  . Penicillins Other (See Comments)     hands and feet edema    Past Medical History    Diagnosis Date  . Hyperlipidemia   . HTN (hypertension)     echo 2/11: EF 50-55%, diast dyfxn, severe LVH, inf HK, LAE  . CAD (coronary artery disease)     a.  NSTEMI treated with PCI Feb 2011 with a DES.([facility]);   b. cath 2/11: D1 stents x 2 ok, AV groove CFX occluded with dist AV CFX filled by L-L collats, RCA occluded, OM2 90-95% (treated with PCI)  . MI (myocardial infarction)   . PVD (peripheral vascular disease)   . DM (diabetes mellitus)   . GERD (gastroesophageal reflux disease)   . Bipolar disorder   . Prostate cancer     status post radiation therapy in 2003  . Pulmonary edema   . Atrial fibrillation   . Respiratory difficulty 06/24/2014    intubated   . Hypertensive emergency 06/24/2014    Past Surgical History  Procedure Laterality Date  . Femoral bypass  2003    History  Smoking status  . Former Smoker -- 0.50 packs/day for 50 years  . Types: Cigarettes  . Quit date: 06/30/2014  Smokeless tobacco  . Not on file    Comment: He has a 50-pack-year hx of tobacco abuse. Currently, smoking about half a pack a day.    History  Alcohol Use  . Yes    Comment: Previously drank heavily, and now has a drink every other day or so.    Family History  Problem Relation Age of Onset  . Cancer Mother   . Cancer Father   . Cancer Sister     Review of Systems: The review of systems is per the HPI.  All other systems were reviewed and are negative.  Physical Exam: BP 130/60  Pulse 72  Ht 6\' 2"  (1.88 m)  Wt 228 lb 6.4 oz (103.602 kg)  BMI 29.31 kg/m2 Patient is very pleasant and in no acute distress. He is obese. Skin is warm and dry. Color is normal.  HEENT is unremarkable. Normocephalic/atraumatic. PERRL. Sclera are nonicteric. Neck is supple. No masses. No JVD. Lungs are clear. Cardiac exam shows an irregular rhythm. Rate ok. Abdomen is soft. Extremities are with 2+ lower extremity edema. Gait and ROM are intact. Using a cane. No gross neurologic deficits  noted.  Wt Readings from Last 3 Encounters:  07/03/14 228 lb 6.4 oz (103.602 kg)  06/29/14 230 lb 13.2 oz (104.7 kg)  09/22/13 226 lb (102.513 kg)    LABORATORY DATA/PROCEDURES:  Lab Results  Component Value Date   WBC 9.5 06/29/2014   HGB 13.1 06/29/2014   HCT 40.3 06/29/2014   PLT 196 06/29/2014   GLUCOSE 212* 06/29/2014   CHOL  Value: 128        ATP III CLASSIFICATION:  <200     mg/dL   Desirable  200-239  mg/dL   Borderline High  >=240    mg/dL   High        02/19/2010   TRIG 82 02/19/2010   HDL 57 02/19/2010   LDLCALC  Value: 55        Total Cholesterol/HDL:CHD Risk Coronary Heart Disease Risk Table                     Men   Women  1/2 Average Risk   3.4   3.3  Average Risk       5.0   4.4  2 X Average Risk   9.6   7.1  3 X Average Risk  23.4   11.0        Use the calculated Patient Ratio above and the CHD Risk Table to determine the patient's CHD Risk.        ATP III CLASSIFICATION (LDL):  <100     mg/dL   Optimal  100-129  mg/dL   Near or Above                    Optimal  130-159  mg/dL   Borderline  160-189  mg/dL   High  >190     mg/dL   Very High 02/19/2010   ALT 21 06/26/2014   AST 23 06/26/2014   NA 139 06/29/2014   K 4.0 06/29/2014   CL 96 06/29/2014   CREATININE 1.60* 06/29/2014   BUN 40* 06/29/2014   CO2 31 06/29/2014   TSH 0.986 st methodology is 3rd generation TSH 02/18/2010   INR 1.08 06/24/2014    BNP (last 3 results)  Recent Labs  06/24/14 1051 06/24/14 1532 06/28/14 0400  PROBNP 4355.0* 5446.0* 2134.0*   Echo Study Conclusions from June 2015  - Left ventricle: The cavity size was normal. There was moderate concentric hypertrophy. Systolic function was normal. The estimated ejection fraction was in the range of 50% to 55%. Hypokinesis of the inferior myocardium. Doppler parameters are consistent with a reversible restrictive pattern, indicative of decreased left ventricular diastolic compliance and/or increased left atrial pressure (grade 3 diastolic dysfunction). - Left  atrium: The atrium was moderately dilated.    Assessment / Plan:  1. Diastolic heart failure - recent exacerbation - weight down a little - salt use is an issue.   2. Chronic edema - most likely salt related.  3. Atrial fib - rate ok - I do not feel like he has demonstrated compliance yet - I do not feel he is a good candidate for anticoagulation at this time. Will readdress at return OV  4. COPD exacerbation - not clear as to how long he will be on steroids - will discuss with PCP - will defer PFTs to his PCP as well. Clearly smoking cessation will help his situation.  5. Bipolar disorder - not clear about his dose of Depakote - he is taking 2 pills in the am and 2 in the evening - he will discuss with the [facility] on his return OV and will stay on this dose.  6. Multisubstance abuse - says he is not drinking bourbon and has stopped smoking. Will see if he can comply for long term.  Recheck labs today. See PCP tomorrow. [facility] nurse coming out tomorrow - clearly needs to have his medicines addressed.   Patient is agreeable to this plan and will call if any problems develop in the interim.   Burtis Junes, RN, [facility] [address] [place], [place]  [id number] [phone]

## 2014-07-04 ENCOUNTER — Telehealth: Payer: Self-pay | Admitting: Cardiovascular Disease

## 2014-07-04 ENCOUNTER — Encounter: Payer: Self-pay | Admitting: Cardiovascular Disease

## 2014-07-04 LAB — BRAIN NATRIURETIC PEPTIDE: Pro B Natriuretic peptide (BNP): 298 pg/mL — ABNORMAL HIGH (ref 0.0–100.0)

## 2014-07-04 NOTE — Telephone Encounter (Signed)
Was she able to clarify his medicines with him?  He was very confused about his medicines and could not tell me anything.  I would like this information before making any changes.   Thanks Cecille Rubin

## 2014-07-04 NOTE — Telephone Encounter (Signed)
Spoke with amy,aware of medication changes. Amy will discuss with the patient.

## 2014-07-04 NOTE — Telephone Encounter (Signed)
Spoke with [name], meds confirmed.

## 2014-07-04 NOTE — Telephone Encounter (Signed)
Ok to increase for 3 days his Lasix to 80 mg in the am (already taking 2 40's in the am) and 40 mg in the mid afternoon - then back to his 80 mg each morning. Really needs to restrict salt. Elevate legs/feet as best he can.

## 2014-07-04 NOTE — Telephone Encounter (Signed)
Spoke with amy, she is seeing the pt for the first time today. She is concerned by the 3+ to 4+ pedal edema he has. Aware this seems to be a chronic issue, she wants to make sure there does not need to be adjustments in his furosemide. She reports he is unable to get his shoes on. Amy also made aware there are compliance issues with the pt. Amy feels the pt is ready to change and is working hard on education. She wants to [facility] sure no med changes are needed at this time Will forward to [employer] gerhardt np who saw the pt yesterday

## 2014-07-04 NOTE — Telephone Encounter (Signed)
New message     Talk to the nurse to give a report and ask questions regarding his heart failure

## 2014-07-05 ENCOUNTER — Other Ambulatory Visit: Payer: Self-pay | Admitting: *Deleted

## 2014-07-05 DIAGNOSIS — I2510 Atherosclerotic heart disease of native coronary artery without angina pectoris: Secondary | ICD-10-CM

## 2014-07-06 ENCOUNTER — Encounter: Payer: Self-pay | Admitting: Cardiovascular Disease

## 2014-07-06 ENCOUNTER — Telehealth: Payer: Self-pay | Admitting: Cardiovascular Disease

## 2014-07-06 NOTE — Telephone Encounter (Signed)
Amy from [facility] calling wanting to know if she could draw labs.  Advised that he was scheduled for BMET on 7/14.  She will draw on Mon 7/13 when she does her visit.  States he is not having any SOB, legs are measuring same as when she saw him on Monday.  Has educated him on his diet; taking salt away and keeping legs elevated. She is going to get him a new scale because his isn't registering correctly.  Made a fu visit with Kathrene Alu for 8/12 at 1:30.  She states Wynelle Link was going to check with Dr. Burt Knack about him being seen in CHF clinic. She will fax results of BMET.  Her Cell is [phone]

## 2014-07-06 NOTE — Telephone Encounter (Signed)
New message    [facility] calling    Report weight today  233.6 . Vital sign 122/78 ,    Patient is not in any distress.   Home health can draw labs for patients - need to know what type of labs.

## 2014-07-09 ENCOUNTER — Telehealth: Payer: Self-pay | Admitting: *Deleted

## 2014-07-09 NOTE — Telephone Encounter (Signed)
S/w Coralyn Mark at Dr. Mellody Drown @ [phone] pt had cmp on 7/8 and a urine culture on 7/10.  Waiting for pt's symptoms to calm down and will order PFt's .  Coralyn Mark is faxing over the results for cmp and still waiting on results from urine.

## 2014-07-09 NOTE — Telephone Encounter (Signed)
Message copied by Tamsen Snider on Mon Jul 09, 2014  9:06 AM ------      Message from: Burtis Junes      Created: Mon Jul 09, 2014  8:16 AM       If he does not get PFTs ordered by his PCP - we can do on his return visit here.            Can we see if we can get his OV note from his PCP?            Cecille Rubin       ----- Message -----         From: SYSTEM         Sent: 07/08/2014  12:01 AM           To: Burtis Junes, NP                   ------

## 2014-07-10 ENCOUNTER — Telehealth: Payer: Self-pay | Admitting: *Deleted

## 2014-07-10 ENCOUNTER — Other Ambulatory Visit: Payer: PRIVATE HEALTH INSURANCE

## 2014-07-10 NOTE — Telephone Encounter (Signed)
Message copied by Tamsen Snider on Tue Jul 10, 2014  9:05 AM ------      Message from: Burtis Junes      Created: Mon Jul 09, 2014  8:16 AM       If he does not get PFTs ordered by his PCP - we can do on his return visit here.            Can we see if we can get his OV note from his PCP?            Cecille Rubin       ----- Message -----         From: SYSTEM         Sent: 07/08/2014  12:01 AM           To: Burtis Junes, NP                   ------

## 2014-07-10 NOTE — Telephone Encounter (Signed)
S/w Coralyn Mark to re-fax over ov note and cmp results.  Stated confirmation.

## 2014-07-11 ENCOUNTER — Other Ambulatory Visit (HOSPITAL_COMMUNITY): Payer: Self-pay | Admitting: Respiratory Therapy

## 2014-07-11 ENCOUNTER — Telehealth: Payer: Self-pay | Admitting: Cardiovascular Disease

## 2014-07-11 DIAGNOSIS — J441 Chronic obstructive pulmonary disease with (acute) exacerbation: Secondary | ICD-10-CM

## 2014-07-11 NOTE — Telephone Encounter (Addendum)
spoke with [facility] and she is requesting that she get 2 more visits while she sets him up with [facility].  Can we extend the visits for 2 more weeks.  His legs are swollen and she feels like she needs to get things settled prior to ending his service.  She is still tying to get in touch with his PCP in regards to the steroids and how to follow up with PFT's after visit as well as get him set up with CHF clinic I let her know I would ask Cecille Rubin for a 2 visit extension to get his case wrapped up

## 2014-07-11 NOTE — Telephone Encounter (Signed)
[name] with [facility] would like to extending nursing orders to once a week. Patient still has lower extremity swelling, please call and advise.

## 2014-07-11 NOTE — Telephone Encounter (Signed)
Please let her know that it is ok to extend his visits.  The plan for the PFTs was to order after he got a little stronger.   I do not think they will let me sign - may need Dr. Burt Knack to sign.

## 2014-07-12 NOTE — Telephone Encounter (Signed)
Close Encounter 

## 2014-07-12 NOTE — Telephone Encounter (Signed)
Advised [name]

## 2014-07-16 ENCOUNTER — Encounter: Payer: Self-pay | Admitting: Cardiovascular Disease

## 2014-07-16 ENCOUNTER — Other Ambulatory Visit: Payer: Self-pay

## 2014-07-16 ENCOUNTER — Emergency Department (HOSPITAL_COMMUNITY): Payer: PRIVATE HEALTH INSURANCE

## 2014-07-16 ENCOUNTER — Observation Stay (HOSPITAL_COMMUNITY)
Admission: EM | Admit: 2014-07-16 | Discharge: 2014-07-20 | Disposition: A | Discharge status: Home / Self Care | Payer: PRIVATE HEALTH INSURANCE | Attending: Internal Medicine | Admitting: Internal Medicine

## 2014-07-16 ENCOUNTER — Encounter (HOSPITAL_COMMUNITY): Payer: Self-pay | Admitting: Emergency Medicine

## 2014-07-16 DIAGNOSIS — R079 Chest pain, unspecified: Secondary | ICD-10-CM

## 2014-07-16 DIAGNOSIS — R0602 Shortness of breath: Secondary | ICD-10-CM | POA: Diagnosis present

## 2014-07-16 DIAGNOSIS — R748 Abnormal levels of other serum enzymes: Secondary | ICD-10-CM | POA: Diagnosis not present

## 2014-07-16 DIAGNOSIS — Z9861 Coronary angioplasty status: Secondary | ICD-10-CM | POA: Insufficient documentation

## 2014-07-16 DIAGNOSIS — Z9119 Patient's noncompliance with other medical treatment and regimen: Secondary | ICD-10-CM

## 2014-07-16 DIAGNOSIS — E1121 Type 2 diabetes mellitus with diabetic nephropathy: Secondary | ICD-10-CM

## 2014-07-16 DIAGNOSIS — I219 Acute myocardial infarction, unspecified: Secondary | ICD-10-CM

## 2014-07-16 DIAGNOSIS — N183 Chronic kidney disease, stage 3 unspecified: Secondary | ICD-10-CM | POA: Diagnosis not present

## 2014-07-16 DIAGNOSIS — I252 Old myocardial infarction: Secondary | ICD-10-CM | POA: Insufficient documentation

## 2014-07-16 DIAGNOSIS — Z79899 Other long term (current) drug therapy: Secondary | ICD-10-CM | POA: Diagnosis not present

## 2014-07-16 DIAGNOSIS — Z7982 Long term (current) use of aspirin: Secondary | ICD-10-CM | POA: Insufficient documentation

## 2014-07-16 DIAGNOSIS — I4729 Other ventricular tachycardia: Secondary | ICD-10-CM | POA: Diagnosis not present

## 2014-07-16 DIAGNOSIS — F319 Bipolar disorder, unspecified: Secondary | ICD-10-CM | POA: Insufficient documentation

## 2014-07-16 DIAGNOSIS — I129 Hypertensive chronic kidney disease with stage 1 through stage 4 chronic kidney disease, or unspecified chronic kidney disease: Secondary | ICD-10-CM | POA: Insufficient documentation

## 2014-07-16 DIAGNOSIS — E1129 Type 2 diabetes mellitus with other diabetic kidney complication: Secondary | ICD-10-CM | POA: Diagnosis present

## 2014-07-16 DIAGNOSIS — Z87891 Personal history of nicotine dependence: Secondary | ICD-10-CM | POA: Insufficient documentation

## 2014-07-16 DIAGNOSIS — IMO0002 Reserved for concepts with insufficient information to code with codable children: Secondary | ICD-10-CM | POA: Insufficient documentation

## 2014-07-16 DIAGNOSIS — Z8546 Personal history of malignant neoplasm of prostate: Secondary | ICD-10-CM | POA: Diagnosis not present

## 2014-07-16 DIAGNOSIS — Z7902 Long term (current) use of antithrombotics/antiplatelets: Secondary | ICD-10-CM | POA: Insufficient documentation

## 2014-07-16 DIAGNOSIS — R609 Edema, unspecified: Secondary | ICD-10-CM

## 2014-07-16 DIAGNOSIS — Z720 Tobacco use: Secondary | ICD-10-CM

## 2014-07-16 DIAGNOSIS — N390 Urinary tract infection, site not specified: Secondary | ICD-10-CM | POA: Diagnosis not present

## 2014-07-16 DIAGNOSIS — I5032 Chronic diastolic (congestive) heart failure: Secondary | ICD-10-CM

## 2014-07-16 DIAGNOSIS — E785 Hyperlipidemia, unspecified: Secondary | ICD-10-CM | POA: Diagnosis not present

## 2014-07-16 DIAGNOSIS — E1122 Type 2 diabetes mellitus with diabetic chronic kidney disease: Secondary | ICD-10-CM

## 2014-07-16 DIAGNOSIS — I472 Ventricular tachycardia, unspecified: Secondary | ICD-10-CM | POA: Insufficient documentation

## 2014-07-16 DIAGNOSIS — J9600 Acute respiratory failure, unspecified whether with hypoxia or hypercapnia: Secondary | ICD-10-CM

## 2014-07-16 DIAGNOSIS — I509 Heart failure, unspecified: Secondary | ICD-10-CM

## 2014-07-16 DIAGNOSIS — J9601 Acute respiratory failure with hypoxia: Secondary | ICD-10-CM

## 2014-07-16 DIAGNOSIS — I482 Chronic atrial fibrillation, unspecified: Secondary | ICD-10-CM

## 2014-07-16 DIAGNOSIS — Z923 Personal history of irradiation: Secondary | ICD-10-CM | POA: Insufficient documentation

## 2014-07-16 DIAGNOSIS — Z794 Long term (current) use of insulin: Secondary | ICD-10-CM | POA: Diagnosis not present

## 2014-07-16 DIAGNOSIS — N179 Acute kidney failure, unspecified: Secondary | ICD-10-CM | POA: Diagnosis not present

## 2014-07-16 DIAGNOSIS — F1020 Alcohol dependence, uncomplicated: Secondary | ICD-10-CM | POA: Insufficient documentation

## 2014-07-16 DIAGNOSIS — I5033 Acute on chronic diastolic (congestive) heart failure: Principal | ICD-10-CM

## 2014-07-16 DIAGNOSIS — E1169 Type 2 diabetes mellitus with other specified complication: Secondary | ICD-10-CM | POA: Diagnosis present

## 2014-07-16 DIAGNOSIS — K219 Gastro-esophageal reflux disease without esophagitis: Secondary | ICD-10-CM | POA: Diagnosis not present

## 2014-07-16 DIAGNOSIS — I2510 Atherosclerotic heart disease of native coronary artery without angina pectoris: Secondary | ICD-10-CM | POA: Diagnosis not present

## 2014-07-16 DIAGNOSIS — N5089 Other specified disorders of the male genital organs: Secondary | ICD-10-CM | POA: Diagnosis not present

## 2014-07-16 DIAGNOSIS — I4891 Unspecified atrial fibrillation: Secondary | ICD-10-CM

## 2014-07-16 DIAGNOSIS — I739 Peripheral vascular disease, unspecified: Secondary | ICD-10-CM

## 2014-07-16 DIAGNOSIS — I10 Essential (primary) hypertension: Secondary | ICD-10-CM

## 2014-07-16 DIAGNOSIS — J440 Chronic obstructive pulmonary disease with acute lower respiratory infection: Secondary | ICD-10-CM | POA: Diagnosis not present

## 2014-07-16 DIAGNOSIS — J209 Acute bronchitis, unspecified: Secondary | ICD-10-CM | POA: Insufficient documentation

## 2014-07-16 DIAGNOSIS — Z91199 Patient's noncompliance with other medical treatment and regimen due to unspecified reason: Secondary | ICD-10-CM | POA: Diagnosis not present

## 2014-07-16 HISTORY — DX: Chronic kidney disease, stage 3 unspecified: N18.30

## 2014-07-16 HISTORY — DX: Chronic kidney disease, stage 3 (moderate): N18.3

## 2014-07-16 LAB — PRO B NATRIURETIC PEPTIDE: Pro B Natriuretic peptide (BNP): 6079 pg/mL — ABNORMAL HIGH (ref 0–450)

## 2014-07-16 LAB — GLUCOSE, CAPILLARY
GLUCOSE-CAPILLARY: 193 mg/dL — AB (ref 70–99)
Glucose-Capillary: 280 mg/dL — ABNORMAL HIGH (ref 70–99)

## 2014-07-16 LAB — BASIC METABOLIC PANEL
ANION GAP: 13 (ref 5–15)
BUN: 43 mg/dL — ABNORMAL HIGH (ref 6–23)
CALCIUM: 8.5 mg/dL (ref 8.4–10.5)
CO2: 30 mEq/L (ref 19–32)
CREATININE: 2.74 mg/dL — AB (ref 0.50–1.35)
Chloride: 94 mEq/L — ABNORMAL LOW (ref 96–112)
GFR calc non Af Amer: 21 mL/min — ABNORMAL LOW (ref >90)
GFR, EST AFRICAN AMERICAN: 25 mL/min — AB (ref >90)
Glucose, Bld: 221 mg/dL — ABNORMAL HIGH (ref 70–99)
Potassium: 3.6 mEq/L — ABNORMAL LOW (ref 3.7–5.3)
Sodium: 137 mEq/L (ref 137–147)

## 2014-07-16 LAB — CBC
HCT: 33.2 % — ABNORMAL LOW (ref 39.0–52.0)
Hemoglobin: 10.9 g/dL — ABNORMAL LOW (ref 13.0–17.0)
MCH: 29.5 pg (ref 26.0–34.0)
MCHC: 32.8 g/dL (ref 30.0–36.0)
MCV: 89.7 fL (ref 78.0–100.0)
PLATELETS: 179 10*3/uL (ref 150–400)
RBC: 3.7 MIL/uL — ABNORMAL LOW (ref 4.22–5.81)
RDW: 14.1 % (ref 11.5–15.5)
WBC: 10.6 10*3/uL — ABNORMAL HIGH (ref 4.0–10.5)

## 2014-07-16 LAB — I-STAT ARTERIAL BLOOD GAS, ED
ACID-BASE EXCESS: 5 mmol/L — AB (ref 0.0–2.0)
BICARBONATE: 29.6 meq/L — AB (ref 20.0–24.0)
O2 SAT: 88 %
PO2 ART: 54 mmHg — AB (ref 80.0–100.0)
Patient temperature: 99.1
TCO2: 31 mmol/L (ref 0–100)
pCO2 arterial: 44.7 mmHg (ref 35.0–45.0)
pH, Arterial: 7.43 (ref 7.350–7.450)

## 2014-07-16 LAB — I-STAT TROPONIN, ED: Troponin i, poc: 0.16 ng/mL (ref 0.00–0.08)

## 2014-07-16 LAB — TROPONIN I: Troponin I: <0.3 ng/mL (ref <0.30)

## 2014-07-16 MED ORDER — ACETAMINOPHEN 325 MG PO TABS: 650.0000 mg | ORAL_TABLET | Freq: Four times a day (QID) | ORAL | Status: DC | PRN

## 2014-07-16 MED ORDER — SODIUM CHLORIDE 0.9 % IJ SOLN: 3.0000 mL | INTRAMUSCULAR | Status: DC | PRN

## 2014-07-16 MED ORDER — DILTIAZEM HCL ER COATED BEADS 120 MG PO TB24
120.0000 mg | ORAL_TABLET | Freq: Every day | ORAL | Status: DC
  Administered 2014-07-17 – 2014-07-20 (×4): 120 mg via ORAL
  Filled 2014-07-16 (×4): qty 1

## 2014-07-16 MED ORDER — GABAPENTIN 300 MG PO CAPS
300.0000 mg | ORAL_CAPSULE | Freq: Three times a day (TID) | ORAL | Status: DC
  Administered 2014-07-16 – 2014-07-20 (×12): 300 mg via ORAL
  Filled 2014-07-16 (×14): qty 1

## 2014-07-16 MED ORDER — ASPIRIN 325 MG PO TABS
325.0000 mg | ORAL_TABLET | ORAL | Status: AC
  Administered 2014-07-16: 325 mg via ORAL
  Filled 2014-07-16: qty 1

## 2014-07-16 MED ORDER — SODIUM CHLORIDE 0.9 % IJ SOLN
3.0000 mL | Freq: Two times a day (BID) | INTRAMUSCULAR | Status: DC
  Administered 2014-07-16 – 2014-07-20 (×8): 3 mL via INTRAVENOUS

## 2014-07-16 MED ORDER — NITROFURANTOIN MACROCRYSTAL 100 MG PO CAPS
100.0000 mg | ORAL_CAPSULE | Freq: Four times a day (QID) | ORAL | Status: DC
  Administered 2014-07-16 – 2014-07-17 (×3): 100 mg via ORAL
  Filled 2014-07-16 (×6): qty 1

## 2014-07-16 MED ORDER — ASPIRIN EC 325 MG PO TBEC
325.0000 mg | DELAYED_RELEASE_TABLET | Freq: Every day | ORAL | Status: DC
  Administered 2014-07-17 – 2014-07-20 (×4): 325 mg via ORAL
  Filled 2014-07-16 (×4): qty 1

## 2014-07-16 MED ORDER — POTASSIUM CHLORIDE CRYS ER 20 MEQ PO TBCR
40.0000 meq | EXTENDED_RELEASE_TABLET | Freq: Once | ORAL | Status: AC
  Administered 2014-07-16: 40 meq via ORAL
  Filled 2014-07-16: qty 2

## 2014-07-16 MED ORDER — CILOSTAZOL 50 MG PO TABS
50.0000 mg | ORAL_TABLET | Freq: Two times a day (BID) | ORAL | Status: DC
  Administered 2014-07-16 – 2014-07-20 (×8): 50 mg via ORAL
  Filled 2014-07-16 (×9): qty 1

## 2014-07-16 MED ORDER — FUROSEMIDE 10 MG/ML IJ SOLN
80.0000 mg | Freq: Once | INTRAMUSCULAR | Status: AC
  Administered 2014-07-16: 80 mg via INTRAVENOUS
  Filled 2014-07-16: qty 8

## 2014-07-16 MED ORDER — SPIRONOLACTONE 25 MG PO TABS
25.0000 mg | ORAL_TABLET | Freq: Every day | ORAL | Status: DC
  Administered 2014-07-16 – 2014-07-20 (×5): 25 mg via ORAL
  Filled 2014-07-16 (×5): qty 1

## 2014-07-16 MED ORDER — FINASTERIDE 5 MG PO TABS
5.0000 mg | ORAL_TABLET | Freq: Every day | ORAL | Status: DC
  Administered 2014-07-17 – 2014-07-20 (×4): 5 mg via ORAL
  Filled 2014-07-16 (×4): qty 1

## 2014-07-16 MED ORDER — DIVALPROEX SODIUM 500 MG PO DR TAB
500.0000 mg | DELAYED_RELEASE_TABLET | Freq: Two times a day (BID) | ORAL | Status: DC
  Administered 2014-07-16 – 2014-07-20 (×8): 500 mg via ORAL
  Filled 2014-07-16 (×9): qty 1

## 2014-07-16 MED ORDER — TAMSULOSIN HCL 0.4 MG PO CAPS
0.4000 mg | ORAL_CAPSULE | Freq: Every day | ORAL | Status: DC
  Administered 2014-07-16 – 2014-07-20 (×5): 0.4 mg via ORAL
  Filled 2014-07-16 (×5): qty 1

## 2014-07-16 MED ORDER — ACETAMINOPHEN 650 MG RE SUPP: 650.0000 mg | Freq: Four times a day (QID) | RECTAL | Status: DC | PRN

## 2014-07-16 MED ORDER — TRAVOPROST (BAK FREE) 0.004 % OP SOLN
1.0000 [drp] | Freq: Every day | OPHTHALMIC | Status: DC
  Administered 2014-07-16 – 2014-07-19 (×4): 1 [drp] via OPHTHALMIC
  Filled 2014-07-16 (×2): qty 2.5

## 2014-07-16 MED ORDER — ALBUTEROL SULFATE (2.5 MG/3ML) 0.083% IN NEBU: 2.5000 mg | INHALATION_SOLUTION | Freq: Four times a day (QID) | RESPIRATORY_TRACT | Status: DC | PRN

## 2014-07-16 MED ORDER — ALUM & MAG HYDROXIDE-SIMETH 200-200-20 MG/5ML PO SUSP: 30.0000 mL | Freq: Four times a day (QID) | ORAL | Status: DC | PRN

## 2014-07-16 MED ORDER — HEPARIN SODIUM (PORCINE) 5000 UNIT/ML IJ SOLN
5000.0000 [IU] | Freq: Three times a day (TID) | INTRAMUSCULAR | Status: DC
  Administered 2014-07-16 – 2014-07-20 (×12): 5000 [IU] via SUBCUTANEOUS
  Filled 2014-07-16 (×13): qty 1

## 2014-07-16 MED ORDER — ONDANSETRON HCL 4 MG/2ML IJ SOLN: 4.0000 mg | Freq: Four times a day (QID) | INTRAMUSCULAR | Status: DC | PRN

## 2014-07-16 MED ORDER — SODIUM CHLORIDE 0.9 % IV SOLN: 250.0000 mL | INTRAVENOUS | Status: DC | PRN

## 2014-07-16 MED ORDER — ALBUTEROL SULFATE HFA 108 (90 BASE) MCG/ACT IN AERS: 2.0000 | INHALATION_SPRAY | Freq: Four times a day (QID) | RESPIRATORY_TRACT | Status: DC | PRN

## 2014-07-16 MED ORDER — NICOTINE 14 MG/24HR TD PT24
14.0000 mg | MEDICATED_PATCH | Freq: Every day | TRANSDERMAL | Status: DC
  Administered 2014-07-16 – 2014-07-18 (×3): 14 mg via TRANSDERMAL
  Filled 2014-07-16 (×3): qty 1

## 2014-07-16 MED ORDER — ONDANSETRON HCL 4 MG PO TABS: 4.0000 mg | ORAL_TABLET | Freq: Four times a day (QID) | ORAL | Status: DC | PRN

## 2014-07-16 MED ORDER — PREDNISONE 20 MG PO TABS
20.0000 mg | ORAL_TABLET | Freq: Every day | ORAL | Status: DC
  Administered 2014-07-17 – 2014-07-20 (×4): 20 mg via ORAL
  Filled 2014-07-16 (×4): qty 1

## 2014-07-16 MED ORDER — INSULIN GLARGINE 100 UNIT/ML ~~LOC~~ SOLN
40.0000 [IU] | Freq: Every day | SUBCUTANEOUS | Status: DC
  Administered 2014-07-17: 40 [IU] via SUBCUTANEOUS
  Filled 2014-07-16 (×3): qty 0.4

## 2014-07-16 MED ORDER — ATORVASTATIN CALCIUM 20 MG PO TABS
20.0000 mg | ORAL_TABLET | Freq: Every day | ORAL | Status: DC
  Administered 2014-07-16 – 2014-07-19 (×4): 20 mg via ORAL
  Filled 2014-07-16 (×5): qty 1

## 2014-07-16 MED ORDER — LISINOPRIL 40 MG PO TABS
40.0000 mg | ORAL_TABLET | Freq: Every day | ORAL | Status: DC
  Administered 2014-07-16 – 2014-07-20 (×5): 40 mg via ORAL
  Filled 2014-07-16 (×5): qty 1

## 2014-07-16 MED ORDER — CARVEDILOL 12.5 MG PO TABS
12.5000 mg | ORAL_TABLET | Freq: Two times a day (BID) | ORAL | Status: DC
  Administered 2014-07-16 – 2014-07-17 (×3): 12.5 mg via ORAL
  Filled 2014-07-16 (×4): qty 1

## 2014-07-16 NOTE — ED Notes (Signed)
[facility] presents with a 78 yo male from home with reported low oxygen levels of 85% by home health nurse.  Pt is not on home oxygen and has history of CHF.  Pt states that he was a little SOB with movement this a.m.; pt normally able to move slowly because of balance issues but no reported illness other than UTI for which he is being treated for currently (last day/dose of medication).  Upon [facility] arrival, pt was at 93% on RA, placed on 2 liters [place] and currently at 99%/lung sounds reported by home health nurse as always diminished and recently been placed on Lasix for fluid in LE.  Pt has been reported to have had significant reduction in fluid in legs but at current assessment at +3 pedal edema

## 2014-07-16 NOTE — H&P (Addendum)
[facility] History and Physical  [name] ZOX:[id number] DOB: 04/15/1936 DOA: 07/16/2014   PCP: Jilda Panda, MD    Chief Complaint: short of breath  HPI: [name] is a 78 y.o. male with PMH of diastolic CHF, presumed COPD, HTN, A-fib and DM. He present with a complaint of shortness of breath which started 3 days and is only occurs when he is talking. He has not been walking much. Ankles have become swollen again. He states he has been eating salt and states he was told by his PCP to drink lots of water for his UTI. He was started on Nitrofurantion by his PCP for a UTI recently. I have spoken with his [place] who [facility] with [facility] [phone]) . She states she found him very short of breath today. She is not sure if he has been gaining weight as she feels his scale has been inaccurate. His Cr has increased from when he was last admitted. He also has a h/o severe COPD and was asked to have PFTs performed as outpt- he is on Prednisone 20 mg daily which is what he was discharged with.    General: The patient denies anorexia, fever, weight loss Cardiac: Denies chest pain, syncope, palpitations, pedal edema  Respiratory: Denies cough - per HPI  GI: Denies severe indigestion/heartburn, abdominal pain, nausea, vomiting, diarrhea and constipation GU: Denies hematuria, incontinence, + dysuria improved with Nitrofurantoin started on 7/14 Musculoskeletal: Denies arthritis  Skin: Denies suspicious skin lesions Neurologic: Denies focal weakness or numbness, change in vision  Past Medical History  Diagnosis Date  . Hyperlipidemia   . HTN (hypertension)     echo 2/11: EF 50-55%, diast dyfxn, severe LVH, inf HK, LAE  . CAD (coronary artery disease)     a.  NSTEMI treated with PCI Feb 2011 with a DES.([facility]);   b. cath 2/11: D1 stents x 2 ok, AV groove CFX occluded with dist AV CFX filled by L-L collats, RCA occluded, OM2 90-95% (treated with PCI)  .  MI (myocardial infarction)   . PVD (peripheral vascular disease)   . DM (diabetes mellitus)   . GERD (gastroesophageal reflux disease)   . Bipolar disorder   . Prostate cancer     status post radiation therapy in 2003  . Pulmonary edema   . Atrial fibrillation   . Respiratory difficulty 06/24/2014    intubated   . Hypertensive emergency 06/24/2014    Past Surgical History  Procedure Laterality Date  . Femoral bypass  2003    Social History: lives alone Smoked cigarettes from age 54 up until he was admitted last time  Has an aid who comes 7 days a week for 2.5 hrs and assists withADLs  Allergies  Allergen Reactions  . Penicillins Other (See Comments)     hands and feet edema    Family History  Problem Relation Age of Onset  . Cancer Mother   . Cancer Father   . Cancer Sister       Prior to Admission medications   Medication Sig Start Date End Date Taking? Authorizing Provider  albuterol (PROVENTIL HFA;VENTOLIN HFA) 108 (90 BASE) MCG/ACT inhaler Inhale 2 puffs into the lungs every 6 (six) hours as needed for shortness of breath.   Yes Historical Provider, MD  aspirin 325 MG EC tablet Take 325 mg by mouth daily.    Yes Historical Provider, MD  atorvastatin (LIPITOR) 40 MG tablet Take 20 mg by mouth at bedtime.  Yes Historical Provider, MD  carvedilol (COREG) 12.5 MG tablet Take 12.5 mg by mouth 2 (two) times daily with a meal.  09/02/12  Yes Historical Provider, MD  cilostazol (PLETAL) 50 MG tablet Take 50 mg by mouth 2 (two) times daily.   Yes Historical Provider, MD  cyanocobalamin 1000 MCG tablet Take 1,000 mcg by mouth daily.   Yes Historical Provider, MD  DAILY MULTIPLE VITAMINS PO Take 1 tablet by mouth once a day.    Yes Historical Provider, MD  diltiazem (CARDIZEM LA) 120 MG 24 hr tablet Take 120 mg by mouth daily.   Yes Historical Provider, MD  divalproex (DEPAKOTE) 250 MG EC tablet Take 500 mg by mouth 2 (two) times daily. 6 tablets everyday.   Yes Historical  Provider, MD  finasteride (PROSCAR) 5 MG tablet Take 5 mg by mouth daily.     Yes Historical Provider, MD  furosemide (LASIX) 40 MG tablet Take 40 mg by mouth daily.  06/29/14  Yes Debbe Odea, MD  gabapentin (NEURONTIN) 300 MG capsule Take 300 mg by mouth 3 (three) times daily.   Yes Historical Provider, MD  insulin glargine (LANTUS) 100 UNIT/ML injection Inject 40 Units into the skin daily.    Yes Historical Provider, MD  insulin regular (HUMULIN R,NOVOLIN R) 100 units/mL injection Inject 8 Units into the skin 2 (two) times daily before a meal.    Yes Historical Provider, MD  lisinopril (PRINIVIL,ZESTRIL) 40 MG tablet Take 40 mg by mouth daily.    Yes Historical Provider, MD  nicotine (NICODERM CQ - DOSED IN MG/24 HOURS) 14 mg/24hr patch Place 1 patch (14 mg total) onto the skin daily. 06/29/14  Yes Debbe Odea, MD  nitrofurantoin (MACRODANTIN) 100 MG capsule Take 100 mg by mouth 4 (four) times daily. For 7 days 07/10/14  Yes Historical Provider, MD  predniSONE (DELTASONE) 20 MG tablet Take 20 mg by mouth daily. 06/29/14  Yes Historical Provider, MD  rosuvastatin (CRESTOR) 20 MG tablet Take 20 mg by mouth daily.   Yes Historical Provider, MD  sildenafil (VIAGRA) 100 MG tablet Take 100 mg by mouth daily as needed for erectile dysfunction. Take as directed as needed 1 hour before sexual activity.   Yes Historical Provider, MD  spironolactone (ALDACTONE) 25 MG tablet Take 25 mg by mouth daily.   Yes Historical Provider, MD  Tamsulosin HCl (FLOMAX) 0.4 MG CAPS Take 0.4 mg by mouth daily.     Yes Historical Provider, MD  travoprost, benzalkonium, (TRAVATAN) 0.004 % ophthalmic solution Place 1 drop into both eyes at bedtime.     Yes Historical Provider, MD     Physical Exam: Filed Vitals:   07/16/14 1206 07/16/14 1414 07/16/14 1418 07/16/14 1552  BP: 119/53  123/66   Pulse: 93  88   Temp: 99.1 F (37.3 C) 98.9 F (37.2 C)    TempSrc: Oral Rectal    Resp: 18  22   Height:    6\' 2"  (1.88 m)   Weight:    104 kg (229 lb 4.5 oz)  SpO2: 93%  99%      General: AAo x3, no distress HEENT: Normocephalic and Atraumatic, Mucous membranes pink                PERRLA; EOM intact; No scleral icterus,                 Nares: Patent, Oropharynx: Clear, Fair Dentition  Neck: FROM, no cervical lymphadenopathy, thyromegaly, carotid bruit or JVD;  Breasts: deferred CHEST WALL: No tenderness  CHEST: Normal respiration, clear to auscultation bilaterally  HEART: Regular rate and rhythm; no murmurs rubs or gallops  BACK: No kyphosis or scoliosis; no CVA tenderness  ABDOMEN: Positive Bowel Sounds, soft, non-tender; no masses, no organomegaly Rectal Exam: deferred EXTREMITIES: No cyanosis, clubbing, or edema Genitalia: not examined  SKIN:  no rash or ulceration  CNS: Alert and Oriented x 4, Nonfocal exam, CN 2-12 intact  Labs on Admission:  Basic Metabolic Panel:  Recent Labs Lab 07/16/14 1305  NA 137  K 3.6*  CL 94*  CO2 30  GLUCOSE 221*  BUN 43*  CREATININE 2.74*  CALCIUM 8.5   Liver Function Tests: No results found for this basename: AST, ALT, ALKPHOS, BILITOT, PROT, ALBUMIN,  in the last 168 hours No results found for this basename: LIPASE, AMYLASE,  in the last 168 hours No results found for this basename: AMMONIA,  in the last 168 hours CBC:  Recent Labs Lab 07/16/14 1305  WBC 10.6*  HGB 10.9*  HCT 33.2*  MCV 89.7  PLT 179   Cardiac Enzymes: No results found for this basename: CKTOTAL, CKMB, CKMBINDEX, TROPONINI,  in the last 168 hours  BNP (last 3 results)  Recent Labs  06/28/14 0400 07/03/14 1514 07/16/14 1305  PROBNP 2134.0* 298.0* 6079.0*   CBG:  Recent Labs Lab 07/16/14 1557  GLUCAP 193*    Radiological Exams on Admission: Dg Chest 2 View  07/16/2014   CLINICAL DATA:  Shortness of breath.  EXAM: CHEST  2 VIEW  COMPARISON:  Single view of the chest 06/27/2014 and 06/25/2014.  FINDINGS: Tiny bilateral pleural effusions are seen.  There is cardiomegaly and mild interstitial edema. No pneumothorax or consolidative process is identified.  IMPRESSION: Cardiomegaly and mild interstitial edema.   Electronically Signed   By: Inge Rise M.D.   On: 07/16/2014 13:35    EKG: Independently reviewed. A-fib at 93 bpm with RBBB and Qtc of 511  Assessment/Plan Principal Problem:   Acute respiratory failure- hypoxic- h/o Diastolic CHF and presumed COPD - I am not fully convinced that this is all due to congestive heart failure  - elevated BNP and pedal edema may be from right heart failure in setting of COPD exacerbation.  - has been given 1 dose of IV Lasix in ER- Cardiology to follow   Active Problems:  Elevated Troponin - possibly from acute resp failure- did not have chest pain - will allow cardiology to manage - cont ASA- follow 3 more sets of troponins  COPD - cont high dose Prednisone for now- may need to consult Pulm- - may need to have PFTs done as inpatient once cardiology feels he is fully diuresed    HYPERTENSION - cont Aldactone, Lasix, Coreg, ACE I and Cardizem unless cardiology feels these need to be modified    CAD/ PVD - cont ASA    Atrial fibrillation - cardiology did not feel he was an appropriate candidate for anticoagulation but her home health RN states she has noted that he takes his medications appropriately- his family states the same - consider anticoagulation  Nicotine abuse - quit a few wks ago - he is still using Nicotine patches- continue at 14 mg daily  UTI - apparently was cultured as outpt and started on Nitrofurantoin- his dysuria symptoms have improved. -- stop time would be after evening dose tomorrow.  .   Consulted: Cardiology  Code  Status: Full code  Family Communication: with daughter  DVT Prophylaxis: Heparin  Time spent: >45 min  [place], MD [facility]  If 7PM-7AM, please contact night-coverage [email or web address] 07/16/2014, 4:45 PM

## 2014-07-16 NOTE — ED Notes (Signed)
Results of troponin given to Dr. Silvio Clayman

## 2014-07-16 NOTE — ED Provider Notes (Signed)
CSN: [id number]     Arrival date & time 07/16/14  1154 History   First MD Initiated Contact with Patient 07/16/14 1212     Chief Complaint  Patient presents with  . Low oxygen level      (Consider location/radiation/quality/duration/timing/severity/associated sxs/prior Treatment) Patient is a 78 y.o. male presenting with shortness of breath.  Shortness of Breath Severity:  Moderate Onset quality:  Gradual Duration:  3 days Timing:  Constant Progression:  Worsening Chronicity:  Recurrent Context comment:  Exertion Relieved by:  Nothing Ineffective treatments:  Diuretics and oxygen Associated symptoms: no abdominal pain, no chest pain, no cough, no fever, no headaches, no neck pain, no rash, no sore throat and no vomiting   Risk factors comment:  Hx of recent intubation   Past Medical History  Diagnosis Date  . Hyperlipidemia   . HTN (hypertension)     echo 2/11: EF 50-55%, diast dyfxn, severe LVH, inf HK, LAE  . CAD (coronary artery disease)     a.  NSTEMI treated with PCI Feb 2011 with a DES.([facility]);   b. cath 2/11: D1 stents x 2 ok, AV groove CFX occluded with dist AV CFX filled by L-L collats, RCA occluded, OM2 90-95% (treated with PCI)  . MI (myocardial infarction)   . PVD (peripheral vascular disease)   . DM (diabetes mellitus)   . GERD (gastroesophageal reflux disease)   . Bipolar disorder   . Prostate cancer     status post radiation therapy in 2003  . Pulmonary edema   . Atrial fibrillation   . Respiratory difficulty 06/24/2014    intubated   . Hypertensive emergency 06/24/2014   Past Surgical History  Procedure Laterality Date  . Femoral bypass  2003   Family History  Problem Relation Age of Onset  . Cancer Mother   . Cancer Father   . Cancer Sister    History  Substance Use Topics  . Smoking status: Former Smoker -- 0.50 packs/day for 50 years    Types: Cigarettes    Quit date: 06/30/2014  . Smokeless tobacco: Not on file     Comment: He has a  50-pack-year hx of tobacco abuse. Currently, smoking about half a pack a day.  . Alcohol Use: Yes     Comment: Previously drank heavily, and now has a drink every other day or so.    Review of Systems  Constitutional: Negative for fever and chills.  HENT: Negative for sore throat.   Eyes: Negative for pain.  Respiratory: Positive for shortness of breath. Negative for cough.   Cardiovascular: Negative for chest pain.  Gastrointestinal: Negative for nausea, vomiting and abdominal pain.  Genitourinary: Negative for dysuria and flank pain.  Musculoskeletal: Negative for back pain and neck pain.  Skin: Negative for rash.  Neurological: Negative for seizures and headaches.      Allergies  Penicillins  Home Medications   Prior to Admission medications   Medication Sig Start Date End Date Taking? Authorizing Provider  albuterol (PROVENTIL HFA;VENTOLIN HFA) 108 (90 BASE) MCG/ACT inhaler Inhale 2 puffs into the lungs every 6 (six) hours as needed for shortness of breath.   Yes Historical Provider, MD  aspirin 325 MG EC tablet Take 325 mg by mouth daily.    Yes Historical Provider, MD  atorvastatin (LIPITOR) 40 MG tablet Take 20 mg by mouth at bedtime.    Yes Historical Provider, MD  carvedilol (COREG) 12.5 MG tablet Take 12.5 mg by mouth 2 (two) times daily with  a meal.  09/02/12  Yes Historical Provider, MD  cilostazol (PLETAL) 50 MG tablet Take 50 mg by mouth 2 (two) times daily.   Yes Historical Provider, MD  cyanocobalamin 1000 MCG tablet Take 1,000 mcg by mouth daily.   Yes Historical Provider, MD  DAILY MULTIPLE VITAMINS PO Take 1 tablet by mouth once a day.    Yes Historical Provider, MD  diltiazem (CARDIZEM LA) 120 MG 24 hr tablet Take 120 mg by mouth daily.   Yes Historical Provider, MD  divalproex (DEPAKOTE) 250 MG EC tablet Take 500 mg by mouth 2 (two) times daily. 6 tablets everyday.   Yes Historical Provider, MD  finasteride (PROSCAR) 5 MG tablet Take 5 mg by mouth daily.     Yes  Historical Provider, MD  furosemide (LASIX) 40 MG tablet Take 40 mg by mouth daily.  06/29/14  Yes Debbe Odea, MD  gabapentin (NEURONTIN) 300 MG capsule Take 300 mg by mouth 3 (three) times daily.   Yes Historical Provider, MD  insulin glargine (LANTUS) 100 UNIT/ML injection Inject 40 Units into the skin daily.    Yes Historical Provider, MD  insulin regular (HUMULIN R,NOVOLIN R) 100 units/mL injection Inject 8 Units into the skin 2 (two) times daily before a meal.    Yes Historical Provider, MD  lisinopril (PRINIVIL,ZESTRIL) 40 MG tablet Take 40 mg by mouth daily.    Yes Historical Provider, MD  nicotine (NICODERM CQ - DOSED IN MG/24 HOURS) 14 mg/24hr patch Place 1 patch (14 mg total) onto the skin daily. 06/29/14  Yes Debbe Odea, MD  nitrofurantoin (MACRODANTIN) 100 MG capsule Take 100 mg by mouth 4 (four) times daily. For 7 days 07/10/14  Yes Historical Provider, MD  predniSONE (DELTASONE) 20 MG tablet Take 20 mg by mouth daily. 06/29/14  Yes Historical Provider, MD  rosuvastatin (CRESTOR) 20 MG tablet Take 20 mg by mouth daily.   Yes Historical Provider, MD  sildenafil (VIAGRA) 100 MG tablet Take 100 mg by mouth daily as needed for erectile dysfunction. Take as directed as needed 1 hour before sexual activity.   Yes Historical Provider, MD  spironolactone (ALDACTONE) 25 MG tablet Take 25 mg by mouth daily.   Yes Historical Provider, MD  Tamsulosin HCl (FLOMAX) 0.4 MG CAPS Take 0.4 mg by mouth daily.     Yes Historical Provider, MD  travoprost, benzalkonium, (TRAVATAN) 0.004 % ophthalmic solution Place 1 drop into both eyes at bedtime.     Yes Historical Provider, MD   BP 149/84  Pulse 86  Temp(Src) 99.8 F (37.7 C) (Oral)  Resp 18  Ht 6\' 2"  (1.88 m)  Wt 228 lb 15.5 oz (103.86 kg)  BMI 29.39 kg/m2  SpO2 97% Physical Exam  Constitutional: He is oriented to person, place, and time. He appears well-developed and well-nourished. No distress.  HENT:  Head: Normocephalic and atraumatic.  Eyes:  Pupils are equal, round, and reactive to light.  Neck: Normal range of motion.  Cardiovascular: Normal rate and regular rhythm.   Pulmonary/Chest: Effort normal. No apnea. No respiratory distress. He has decreased breath sounds. He has no wheezes.  Abdominal: Soft. He exhibits no distension. There is no tenderness.  Musculoskeletal: Normal range of motion.       Right lower leg: He exhibits edema.       Left lower leg: He exhibits edema.  Neurological: He is alert and oriented to person, place, and time.  Skin: Skin is warm. He is not diaphoretic.    ED  Course  Procedures (including critical care time) Labs Review Labs Reviewed  CBC - Abnormal; Notable for the following:    WBC 10.6 (*)    RBC 3.70 (*)    Hemoglobin 10.9 (*)    HCT 33.2 (*)    All other components within normal limits  BASIC METABOLIC PANEL - Abnormal; Notable for the following:    Potassium 3.6 (*)    Chloride 94 (*)    Glucose, Bld 221 (*)    BUN 43 (*)    Creatinine, Ser 2.74 (*)    GFR calc non Af Amer 21 (*)    GFR calc Af Amer 25 (*)    All other components within normal limits  PRO B NATRIURETIC PEPTIDE - Abnormal; Notable for the following:    Pro B Natriuretic peptide (BNP) 6079.0 (*)    All other components within normal limits  GLUCOSE, CAPILLARY - Abnormal; Notable for the following:    Glucose-Capillary 193 (*)    All other components within normal limits  CBC - Abnormal; Notable for the following:    WBC 11.0 (*)    RBC 3.77 (*)    Hemoglobin 11.0 (*)    HCT 33.8 (*)    All other components within normal limits  BASIC METABOLIC PANEL - Abnormal; Notable for the following:    Sodium 135 (*)    Chloride 94 (*)    Glucose, Bld 206 (*)    BUN 43 (*)    Creatinine, Ser 2.24 (*)    GFR calc non Af Amer 27 (*)    GFR calc Af Amer 31 (*)    All other components within normal limits  GLUCOSE, CAPILLARY - Abnormal; Notable for the following:    Glucose-Capillary 280 (*)    All other  components within normal limits  GLUCOSE, CAPILLARY - Abnormal; Notable for the following:    Glucose-Capillary 204 (*)    All other components within normal limits  I-STAT TROPOININ, ED - Abnormal; Notable for the following:    Troponin i, poc 0.16 (*)    All other components within normal limits  I-STAT ARTERIAL BLOOD GAS, ED - Abnormal; Notable for the following:    pO2, Arterial 54.0 (*)    Bicarbonate 29.6 (*)    Acid-Base Excess 5.0 (*)    All other components within normal limits  TROPONIN I  TROPONIN I  TROPONIN I    Imaging Review Dg Chest 2 View  07/16/2014   CLINICAL DATA:  Shortness of breath.  EXAM: CHEST  2 VIEW  COMPARISON:  Single view of the chest 06/27/2014 and 06/25/2014.  FINDINGS: Tiny bilateral pleural effusions are seen. There is cardiomegaly and mild interstitial edema. No pneumothorax or consolidative process is identified.  IMPRESSION: Cardiomegaly and mild interstitial edema.   Electronically Signed   By: Inge Rise M.D.   On: 07/16/2014 13:35     EKG Interpretation None      MDM   Final diagnoses:  Acute on chronic diastolic CHF (congestive heart failure)   78 yo M with hx of recent admission <1 month ago for respiratory failure 2/2 CHF exacerbation (intubated, in ICU) who presents with DOE x2-3 days, hypoxia per [facility] staff.   Upon arrival, patient HDS, in NAD. Patient with sats in low 90s on RA, but during conversation, began to drop into mid 80s. Placed on 2L [place]. No complaints of chest pain. Will obtain basic labs, XR. Concern for repeat CHF exacerbation.  BNP elevated. iStat troponin elevated. I spoke with cardiology, who feels troponin is demand, and recommends following enzymes. No heparin recommended at this time. Will start lasix. Spoke with hospitalist, who agrees to admit the patient to telemetry for treatment of CHF exacerbation. Admitted to the floor in stable condition. Patient seen and evaluated by myself and my  attending, Dr. Alvino Chapel.     Freddi Che, MD 07/17/14 [phone]

## 2014-07-16 NOTE — Consult Note (Signed)
CARDIOLOGY CONSULT NOTE   Patient ID: [name] MRN: [id number], DOB/AGE: 03-Jan-1936   Admit date: 07/16/2014 Date of Consult: 07/16/2014   Primary Physician: Jilda Panda, MD Primary Cardiologist: Dr. Burt Knack  Pt. Profile   78 year old male with past medical history of tobacco abuse, alcoholism, hypertension, coronary artery disease, peripheral arterial disease, bipolar, chronic kidney disease and chronic diastolic heart failure presented with dyspnea and decreased urine output  Problem List  Past Medical History  Diagnosis Date  . Hyperlipidemia   . HTN (hypertension)     echo 2/11: EF 50-55%, diast dyfxn, severe LVH, inf HK, LAE  . CAD (coronary artery disease)     a.  NSTEMI treated with PCI Feb 2011 with a DES.([facility]);   b. cath 2/11: D1 stents x 2 ok, AV groove CFX occluded with dist AV CFX filled by L-L collats, RCA occluded, OM2 90-95% (treated with PCI)  . MI (myocardial infarction)   . PVD (peripheral vascular disease)   . DM (diabetes mellitus)   . GERD (gastroesophageal reflux disease)   . Bipolar disorder   . Prostate cancer     status post radiation therapy in 2003  . Pulmonary edema   . Atrial fibrillation   . Respiratory difficulty 06/24/2014    intubated   . Hypertensive emergency 06/24/2014    Past Surgical History  Procedure Laterality Date  . Femoral bypass  2003     Allergies  Allergies  Allergen Reactions  . Penicillins Other (See Comments)     hands and feet edema    HPI   The patient is a 78 year old male with past medical history of tobacco abuse, alcoholism, hypertension, coronary artery disease, peripheral arterial disease, bipolar, chronic kidney disease and chronic diastolic heart failure. Patient has been in chronic atrial fibrillation however he is deemed not a candidate for coagulation given his history of noncompliance, imbalance, and alcoholism. The patient was recently admitted on June 28 for hypercapnic respiratory failure  and hypertensive emergency. He was noted to have atrial fibrillation on arrival and elevated troponin. His elevated troponin was thought to be secondary to atrial fibrillation and respiratory distress. Serial troponin at the time shows his troponin eventually trended down. His last PCI was in February 2011 when he had NSTEMI. Echocardiogram was obtained during the last admission on 06/16/2014 which showed the EF 50-55%, hypokinesis of the pericardium, grade 3 diastolic dysfunction. Patient was treated for both acute on chronic diastolic heart failure and possible COPD during the last admission. He required a brief intubation at the time. He was eventually [facility] with [facility].  According to the patient, since his last discharge, he has been doing good at home. His baseline weight has been between 220-230. He believes his home scale has been broken as he actually has been losing weight despite some mild increasing shortness of breath recently. Patient's last followup was with Dr. Antionette Char NP in the office on 07/03/2014. At that time,it was concerning that he continued to have significant lower extremity edema. Cardiology office was contacted by the patient's home health nurse concerning for increasing shortness breath, his Lasix was temporarily increased from 40 mg daily to 80 mg in the morning and 40 mg at night for 3 days. According to the patient, he is Lasix eventually the scalp back down to 80 mg per day. He states he has been reluctant to compliant with low-salt diet and has not eating much in the last 3 days because of how to food  taste without salt. He was also told that he needs to keep himself hydrated by drinking a lot of water especially when he is taking Lasix at the same time. He states he always had significant lower extremity edema. Despite how it looks, his lower extremity edema actually has been improving since his last admission. Interestingly, patient states he has been having  some increasing shortness of breath for the past 3 days with some decreased urine output at the same time.  Patient was referred to [facility] ED on 07/16/2014 by his home health nurse who noted his O2 saturation dropped down to the 80s with minimal exertion. On arrival, he was noted to have a blood pressure 130/57. O2 saturation 95% on room air. Significant laboratory finding include potassium of 3.6, creatinine of 2.74 which has increased from 1.8 on recent office visit, troponin of 0.16, proBNP of 6079, white blood cell count of 10.6, hemoglobin of 10.9. EKG showed chronic right bundle branch block with A. fib with heart rate in the 90s. Chest x-ray showed cardiomegaly with mild interstitial edema. Both hospitalist group and cardiology were consulted.    Inpatient Medications  . furosemide  80 mg Intravenous Once  . potassium chloride  40 mEq Oral Once    Family History Family History  Problem Relation Age of Onset  . Cancer Mother   . Cancer Father   . Cancer Sister      Social History History   Social History  . Marital Status: Divorced    Spouse Name: N/A    Number of Children: N/A  . Years of Education: N/A   Occupational History  . retired Architect    Social History Main Topics  . Smoking status: Former Smoker -- 0.50 packs/day for 50 years    Types: Cigarettes    Quit date: 06/30/2014  . Smokeless tobacco: Not on file     Comment: He has a 50-pack-year hx of tobacco abuse. Currently, smoking about half a pack a day.  . Alcohol Use: Yes     Comment: Previously drank heavily, and now has a drink every other day or so.  . Drug Use: No  . Sexual Activity: Not on file   Other Topics Concern  . Not on file   Social History Narrative  . No narrative on file     Review of Systems  General:  No chills, fever, night sweats or weight changes.  Cardiovascular:  No chest pain, dyspnea on exertion, orthopnea, palpitations, paroxysmal nocturnal dyspnea. +Chronic LE  edema Dermatological: No rash, lesions/masses Respiratory: No cough. +dyspnea Urologic: No hematuria, dysuria Abdominal:   No nausea, vomiting, diarrhea, bright red blood per rectum, melena, or hematemesis Neurologic:  No visual changes, wkns, changes in mental status. All other systems reviewed and are otherwise negative except as noted above.  Physical Exam  Blood pressure 123/66, pulse 88, temperature 98.9 F (37.2 C), temperature source Rectal, resp. rate 22, SpO2 99.00%.  General: Pleasant, NAD Psych: Normal affect. Neuro: Alert and oriented X 3. Moves all extremities spontaneously. HEENT: Normal  Neck: Supple without bruits  Lungs:  Resp regular and unlabored, bibasilar rale, decreased breath sound in bilateral lung fields. Heart: irregular no s3, s4, or murmurs. Abdomen: Soft, non-tender, non-distended, BS + x 4.  Extremities: No clubbing, cyanosis. DP/PT/Radials 2+ and equal bilaterally. 3+ edema in bilateral lower extremity and feet.  Labs  No results found for this basename: CKTOTAL, CKMB, TROPONINI,  in the last 72 hours Lab Results  Component Value Date   WBC 10.6* 07/16/2014   HGB 10.9* 07/16/2014   HCT 33.2* 07/16/2014   MCV 89.7 07/16/2014   PLT 179 07/16/2014    Recent Labs Lab 07/16/14 1305  NA 137  K 3.6*  CL 94*  CO2 30  BUN 43*  CREATININE 2.74*  CALCIUM 8.5  GLUCOSE 221*   Lab Results  Component Value Date   CHOL  Value: 128        ATP III CLASSIFICATION:  <200     mg/dL   Desirable  200-239  mg/dL   Borderline High  >=240    mg/dL   High        02/19/2010   HDL 57 02/19/2010   LDLCALC  Value: 55        Total Cholesterol/HDL:CHD Risk Coronary Heart Disease Risk Table                     Men   Women  1/2 Average Risk   3.4   3.3  Average Risk       5.0   4.4  2 X Average Risk   9.6   7.1  3 X Average Risk  23.4   11.0        Use the calculated Patient Ratio above and the CHD Risk Table to determine the patient's CHD Risk.        ATP III CLASSIFICATION  (LDL):  <100     mg/dL   Optimal  100-129  mg/dL   Near or Above                    Optimal  130-159  mg/dL   Borderline  160-189  mg/dL   High  >190     mg/dL   Very High 02/19/2010   TRIG 82 02/19/2010    Radiology/Studies  Dg Chest 2 View  07/16/2014   CLINICAL DATA:  Shortness of breath.  EXAM: CHEST  2 VIEW  COMPARISON:  Single view of the chest 06/27/2014 and 06/25/2014.  FINDINGS: Tiny bilateral pleural effusions are seen. There is cardiomegaly and mild interstitial edema. No pneumothorax or consolidative process is identified.  IMPRESSION: Cardiomegaly and mild interstitial edema.   Electronically Signed   By: Inge Rise M.D.   On: 07/16/2014 13:35   Dg Chest Port 1 View  06/27/2014   CLINICAL DATA:  Pulmonary edema.  EXAM: PORTABLE CHEST - 1 VIEW  COMPARISON:  Multiple recent previous exams.  FINDINGS: The diffuse interstitial and basilar alveolar opacity persists and is slightly improved in the interval. The cardio pericardial silhouette is enlarged. Right IJ central line has been removed. Telemetry leads overlie the chest.  IMPRESSION: Interval improvement in pulmonary edema pattern with removal of the right IJ central line.   Electronically Signed   By: Misty Stanley M.D.   On: 06/27/2014 07:39   Dg Chest Port 1 View  06/26/2014   CLINICAL DATA:  Followup edema  EXAM: PORTABLE CHEST - 1 VIEW  COMPARISON:  06/25/2014  FINDINGS: Endotracheal tube removed.  Central venous catheter tip in the SVC.  Interval improvement in pulmonary edema. Improvement in bibasilar atelectasis which remains. Small right effusion has improved.  IMPRESSION: Significant improvement in pulmonary edema. Mild bibasilar atelectasis remains.  Endotracheal tube removed.   Electronically Signed   By: Franchot Gallo M.D.   On: 06/26/2014 07:26   Dg Chest Port 1 View  06/25/2014   CLINICAL DATA:  Central  line placement.  EXAM: PORTABLE CHEST - 1 VIEW  COMPARISON:  06/25/2014.  FINDINGS: The endotracheal tube and NG  tubes are stable. There is a new right IJ central venous catheter with its tip at the level of the carina in the region of the mid SVC. The heart and lungs are stable.  IMPRESSION: Right IJ central venous catheter in good position with its tip in the mid SVC. No complicating features such as pneumothorax.  Stable mild cardiac enlargement, pulmonary vascular congestion and possible mild edema.   Electronically Signed   By: Kalman Jewels M.D.   On: 06/25/2014 12:27   Dg Chest Port 1 View  06/25/2014   CLINICAL DATA:  Evaluate endotracheal tube, airspace disease  EXAM: PORTABLE CHEST - 1 VIEW  COMPARISON:  06/24/2014; 11/29/2012; 07/09/2011  FINDINGS: Grossly unchanged enlarged cardiac silhouette and mediastinal contours given persistently reduced lung volumes and patient rotation. Atherosclerotic plaque within the thoracic aorta. Stable position of support apparatus. No pneumothorax. Worsening bilateral mid and lower lung heterogeneous opacities, right greater than left. Pulmonary vasculature remains indistinct with cephalization of flow. Trace bilateral effusions are not excluded, right greater than left. Grossly unchanged bones.  IMPRESSION: 1.  Stable positioning of support apparatus.  No pneumothorax. 2. Findings most suggestive of worsening pulmonary edema and bibasilar opacities, atelectasis versus infiltrate.   Electronically Signed   By: Sandi Mariscal M.D.   On: 06/25/2014 07:55   Dg Chest Portable 1 View  06/24/2014   CLINICAL DATA:  SHORTNESS OF BREATH  EXAM: PORTABLE CHEST - 1 VIEW  COMPARISON:  11/29/2012  FINDINGS: Endotracheal tube has been placed, tip approximately 5.8 cm above carina. Atheromatous aorta. Heart size upper limits normal. Moderate interstitial and airspace opacities in a predominantly perihilar and infrahilar distribution, increased since previous exam, right greater than left. No effusion. Visualized skeletal structures are unremarkable.  IMPRESSION: 1. Endotracheal tube tip 5.8 cm  above carina. 2. Interval increase in asymmetric bilateral edema/infiltrates.   Electronically Signed   By: Arne Cleveland M.D.   On: 06/24/2014 11:38   Dg Abd Portable 1v  06/24/2014   CLINICAL DATA:  OG tube placement.  EXAM: PORTABLE ABDOMEN - 1 VIEW  COMPARISON:  None.  FINDINGS: The OG tube tip is in the distal stomach. No abnormal dilated loops of bowel. No suspicious radiopaque foreign bodies are soft tissue calcifications. Calcified atherosclerotic disease involves the abdominal aorta and its branches.  IMPRESSION: 1. OG tube tip is in satisfactory position within the distal stomach.   Electronically Signed   By: Kerby Moors M.D.   On: 06/24/2014 20:59    ECG  Atrial fibrillation with right bundle branch block, heart rate 90s  ASSESSMENT AND PLAN  1. Acute on chronic resp insufficiency; see below 2. Acute on chronic diastolic HF  - proBNP increased from 200 two wks ago --> 6000  - IM to admit. ?if have component of COPD as well, significantly decreased breath sound bilaterally  - grade 3 diastolic HF on recent echo  - IV lasix 80mg  in ED, reassess in am, monitor Cr  - not compliant with salt and fluid restriction, educated on compliance  - LE edema may be chronic and related to other factors as well, no albumin lab draw on admission  3. Elevated troponin in the setting of AKI and CHF  - continue to trend, unlikely to be ACS  4. Acute on chronic renal insufficiency 5. tobacco abuse and alcoholism 6. Hypertension 7. coronary artery disease 8.  peripheral arterial disease 9. Persistent a-fib: not a candidate for anticoagulation with noncompliance, imbalance and h/o alcoholism 10. Chronic RBBB  Signed, Almyra Deforest, [place] 07/16/2014, 3:00 PM  Attending Note:   The patient was seen and examined.  Agree with assessment and plan as noted above.  Changes made to the above note as needed.  Mr. Tavis is a 78  Yo with significant diagnostic dysfunction.  He has been eating lots  of salt.  And drinking lots of water.  He has been taking more lasix recently but his urine output has diminished recently.   He is seen now and has been found to have worsening renal function.he is volume overloaded on exam. , few rales, + JVD. 3+ leg edema  Agree with IV lasix.   He needs to be more compliant with his diet.   Following   Thayer Headings, Brooke Bonito., MD, [facility] 07/16/2014, 4:21 PM

## 2014-07-17 ENCOUNTER — Encounter (HOSPITAL_COMMUNITY): Payer: Self-pay | Admitting: Physician Assistant

## 2014-07-17 DIAGNOSIS — I5033 Acute on chronic diastolic (congestive) heart failure: Secondary | ICD-10-CM | POA: Diagnosis not present

## 2014-07-17 DIAGNOSIS — N183 Chronic kidney disease, stage 3 unspecified: Secondary | ICD-10-CM | POA: Diagnosis present

## 2014-07-17 DIAGNOSIS — E1129 Type 2 diabetes mellitus with other diabetic kidney complication: Secondary | ICD-10-CM

## 2014-07-17 DIAGNOSIS — N189 Chronic kidney disease, unspecified: Secondary | ICD-10-CM

## 2014-07-17 LAB — BASIC METABOLIC PANEL
Anion gap: 14 (ref 5–15)
BUN: 43 mg/dL — ABNORMAL HIGH (ref 6–23)
CHLORIDE: 94 meq/L — AB (ref 96–112)
CO2: 27 meq/L (ref 19–32)
CREATININE: 2.24 mg/dL — AB (ref 0.50–1.35)
Calcium: 8.6 mg/dL (ref 8.4–10.5)
GFR calc non Af Amer: 27 mL/min — ABNORMAL LOW (ref >90)
GFR, EST AFRICAN AMERICAN: 31 mL/min — AB (ref >90)
GLUCOSE: 206 mg/dL — AB (ref 70–99)
Potassium: 4.1 mEq/L (ref 3.7–5.3)
Sodium: 135 mEq/L — ABNORMAL LOW (ref 137–147)

## 2014-07-17 LAB — CBC
HEMATOCRIT: 33.8 % — AB (ref 39.0–52.0)
HEMOGLOBIN: 11 g/dL — AB (ref 13.0–17.0)
MCH: 29.2 pg (ref 26.0–34.0)
MCHC: 32.5 g/dL (ref 30.0–36.0)
MCV: 89.7 fL (ref 78.0–100.0)
Platelets: 185 10*3/uL (ref 150–400)
RBC: 3.77 MIL/uL — AB (ref 4.22–5.81)
RDW: 14.1 % (ref 11.5–15.5)
WBC: 11 10*3/uL — ABNORMAL HIGH (ref 4.0–10.5)

## 2014-07-17 LAB — TROPONIN I
Troponin I: <0.3 ng/mL (ref <0.30)
Troponin I: <0.3 ng/mL (ref <0.30)

## 2014-07-17 LAB — GLUCOSE, CAPILLARY
GLUCOSE-CAPILLARY: 204 mg/dL — AB (ref 70–99)
Glucose-Capillary: 290 mg/dL — ABNORMAL HIGH (ref 70–99)
Glucose-Capillary: 368 mg/dL — ABNORMAL HIGH (ref 70–99)
Glucose-Capillary: 336 mg/dL — ABNORMAL HIGH (ref 70–99)

## 2014-07-17 MED ORDER — NITROFURANTOIN MACROCRYSTAL 100 MG PO CAPS
100.0000 mg | ORAL_CAPSULE | Freq: Four times a day (QID) | ORAL | Status: AC
  Administered 2014-07-17 (×2): 100 mg via ORAL
  Filled 2014-07-17 (×2): qty 1

## 2014-07-17 MED ORDER — IPRATROPIUM BROMIDE 0.02 % IN SOLN
0.5000 mg | Freq: Four times a day (QID) | RESPIRATORY_TRACT | Status: DC
Start: 2014-07-17 — End: 2014-07-18
  Administered 2014-07-17 – 2014-07-18 (×2): 0.5 mg via RESPIRATORY_TRACT
  Filled 2014-07-17 (×2): qty 2.5

## 2014-07-17 MED ORDER — LEVALBUTEROL HCL 0.63 MG/3ML IN NEBU
0.6300 mg | INHALATION_SOLUTION | Freq: Four times a day (QID) | RESPIRATORY_TRACT | Status: DC
  Administered 2014-07-17 – 2014-07-18 (×2): 0.63 mg via RESPIRATORY_TRACT
  Filled 2014-07-17 (×6): qty 3

## 2014-07-17 MED ORDER — DOXYCYCLINE HYCLATE 100 MG PO TABS
100.0000 mg | ORAL_TABLET | Freq: Two times a day (BID) | ORAL | Status: DC
  Administered 2014-07-17 – 2014-07-20 (×6): 100 mg via ORAL
  Filled 2014-07-17 (×7): qty 1

## 2014-07-17 MED ORDER — INSULIN ASPART 100 UNIT/ML ~~LOC~~ SOLN
0.0000 [IU] | Freq: Every day | SUBCUTANEOUS | Status: DC
  Administered 2014-07-17: 4 [IU] via SUBCUTANEOUS

## 2014-07-17 MED ORDER — INSULIN ASPART 100 UNIT/ML ~~LOC~~ SOLN
0.0000 [IU] | Freq: Three times a day (TID) | SUBCUTANEOUS | Status: DC
  Administered 2014-07-17: 15 [IU] via SUBCUTANEOUS
  Administered 2014-07-18: 5 [IU] via SUBCUTANEOUS
  Administered 2014-07-18: 11 [IU] via SUBCUTANEOUS
  Administered 2014-07-19: 5 [IU] via SUBCUTANEOUS
  Administered 2014-07-19: 2 [IU] via SUBCUTANEOUS
  Administered 2014-07-20 (×2): 3 [IU] via SUBCUTANEOUS

## 2014-07-17 NOTE — Progress Notes (Signed)
[facility]  Patient Status: Active (receiving services up to time of hospitalization)  [facility] is providing the following services: RN and PT  If patient discharges after hours, please call [phone].   [name] 07/17/2014, 10:19 AM

## 2014-07-17 NOTE — Progress Notes (Signed)
Chart reviewed.   [facility] PROGRESS NOTE  [name] WUJ:[id number] DOB: [birth year] DOA: 07/16/2014 PCP: Jilda Panda, MD  Assessment/Plan:  Principal Problem:   Acute respiratory failure secondary to acute CHF and COPD exacerbation. Coughing. Will add doxy for acute bronchitis. Continue prednisone, not on scheduled nebs. Will order xopenex and atrovent. Active Problems:   HYPERTENSION   CAD   PVD   Atrial fibrillation, not a coumadin candidate due to noncompliance and EtOH   Diastolic CHF, acute on chronic: per cardiology COPD exacerbation with acute bronchitis Deconditioning. PT eval CKD 3: monitor DM 2 with renal manifestations: add SSI Recent uti: last day macrobid today. HTN HLD  Code Status:  full Family Communication:   Disposition Plan:  Home with home health  Consultants:  [facility] heart  Procedures:     Antibiotics:  macrobid  HPI/Subjective: Breathing better but not at baseline. Nonproductive cough  Objective: Filed Vitals:   07/17/14 1500  BP: 130/69  Pulse: 84  Temp: 98.9 F (37.2 C)  Resp: 18    Intake/Output Summary (Last 24 hours) at 07/17/14 1559 Last data filed at 07/17/14 1500  Gross per 24 hour  Intake   1680 ml  Output   1525 ml  Net    155 ml   Filed Weights   07/16/14 1552 07/17/14 0537  Weight: 104 kg (229 lb 4.5 oz) 103.86 kg (228 lb 15.5 oz)    Exam:   General:  Drowsy. Lying flat  Cardiovascular: irreg irreg without MGR  Respiratory: diminished throughout without WRR  Abdomen: s, nt, nd  Ext: 2+edema, no CC  Basic Metabolic Panel:  Recent Labs Lab 07/16/14 1305 07/17/14 0508  NA 137 135*  K 3.6* 4.1  CL 94* 94*  CO2 30 27  GLUCOSE 221* 206*  BUN 43* 43*  CREATININE 2.74* 2.24*  CALCIUM 8.5 8.6   Liver Function Tests: No results found for this basename: AST, ALT, ALKPHOS, BILITOT, PROT, ALBUMIN,  in the last 168 hours No results found for this basename: LIPASE, AMYLASE,  in the last 168  hours No results found for this basename: AMMONIA,  in the last 168 hours CBC:  Recent Labs Lab 07/16/14 1305 07/17/14 0508  WBC 10.6* 11.0*  HGB 10.9* 11.0*  HCT 33.2* 33.8*  MCV 89.7 89.7  PLT 179 185   Cardiac Enzymes:  Recent Labs Lab 07/16/14 1806 07/16/14 2253 07/17/14 0508  TROPONINI <0.30 <0.30 <0.30   BNP (last 3 results)  Recent Labs  06/28/14 0400 07/03/14 1514 07/16/14 1305  PROBNP 2134.0* 298.0* 6079.0*   CBG:  Recent Labs Lab 07/16/14 1557 07/16/14 2059 07/17/14 0539 07/17/14 1202  GLUCAP 193* 280* 204* 290*    No results found for this or any previous visit (from the past 240 hour(s)).   Studies: Dg Chest 2 View  07/16/2014   CLINICAL DATA:  Shortness of breath.  EXAM: CHEST  2 VIEW  COMPARISON:  Single view of the chest 06/27/2014 and 06/25/2014.  FINDINGS: Tiny bilateral pleural effusions are seen. There is cardiomegaly and mild interstitial edema. No pneumothorax or consolidative process is identified.  IMPRESSION: Cardiomegaly and mild interstitial edema.   Electronically Signed   By: Inge Rise M.D.   On: 07/16/2014 13:35    Scheduled Meds: . aspirin  325 mg Oral Daily  . atorvastatin  20 mg Oral QHS  . carvedilol  12.5 mg Oral BID WC  . cilostazol  50 mg Oral BID  . diltiazem  120 mg Oral  Daily  . divalproex  500 mg Oral BID  . finasteride  5 mg Oral Daily  . gabapentin  300 mg Oral TID  . heparin  5,000 Units Subcutaneous 3 times per day  . insulin aspart  0-15 Units Subcutaneous TID WC  . insulin aspart  0-5 Units Subcutaneous QHS  . insulin glargine  40 Units Subcutaneous Daily  . lisinopril  40 mg Oral Daily  . nicotine  14 mg Transdermal Daily  . nitrofurantoin  100 mg Oral QID  . predniSONE  20 mg Oral Daily  . sodium chloride  3 mL Intravenous Q12H  . spironolactone  25 mg Oral Daily  . tamsulosin  0.4 mg Oral Daily  . Travoprost (BAK Free)  1 drop Both Eyes QHS   Continuous Infusions:   Time spent: 35  minutes  [facility] Pager [phone]. If 7PM-7AM, please contact night-coverage at [email or web address], password [email or web address] 07/17/2014, 3:59 PM  LOS: 1 day

## 2014-07-17 NOTE — Progress Notes (Signed)
Patient: YORK VALLIANT / Admit Date: 07/16/2014 / Date of Encounter: 07/17/2014, 10:32 AM   Subjective: Feeling better but not quite yet at baseline with breathing. Denies CP. Trying to rest.   Objective: Telemetry: persistent AF, rate controlled Physical Exam: Blood pressure 149/84, pulse 86, temperature 99.8 F (37.7 C), temperature source Oral, resp. rate 18, height 6\' 2"  (1.88 m), weight 228 lb 15.5 oz (103.86 kg), SpO2 97.00%. General: Well developed, well nourished AAMM, in no acute distress. Head: Normocephalic, atraumatic, sclera non-icteric, no xanthomas, nares are without discharge. Neck: Negative for carotid bruits. JVP not elevated. Lungs: Coarse BS at bases, diminished air movement throughout without wheezes or rales. Breathing is unlabored. Heart: Irregularly irregular, controlled rate, S1 S2 without murmurs, rubs, or gallops.  Abdomen: Soft, non-tender, non-distended with normoactive bowel sounds. No rebound/guarding. Extremities: No clubbing or cyanosis. 2+ BLE edema. Distal pedal pulses are 2+ and equal bilaterally. Neuro: Alert and oriented X 3. Moves all extremities spontaneously. Psych:  Responds to questions appropriately with a normal affect.   Intake/Output Summary (Last 24 hours) at 07/17/14 1032 Last data filed at 07/17/14 1023  Gross per 24 hour  Intake   1200 ml  Output   1525 ml  Net   -325 ml    Inpatient Medications:  . aspirin  325 mg Oral Daily  . atorvastatin  20 mg Oral QHS  . carvedilol  12.5 mg Oral BID WC  . cilostazol  50 mg Oral BID  . diltiazem  120 mg Oral Daily  . divalproex  500 mg Oral BID  . finasteride  5 mg Oral Daily  . gabapentin  300 mg Oral TID  . heparin  5,000 Units Subcutaneous 3 times per day  . insulin glargine  40 Units Subcutaneous Daily  . lisinopril  40 mg Oral Daily  . nicotine  14 mg Transdermal Daily  . nitrofurantoin  100 mg Oral QID  . predniSONE  20 mg Oral Daily  . sodium chloride  3 mL Intravenous Q12H   . spironolactone  25 mg Oral Daily  . tamsulosin  0.4 mg Oral Daily  . Travoprost (BAK Free)  1 drop Both Eyes QHS   Infusions:    Labs:  Recent Labs  07/16/14 1305 07/17/14 0508  NA 137 135*  K 3.6* 4.1  CL 94* 94*  CO2 30 27  GLUCOSE 221* 206*  BUN 43* 43*  CREATININE 2.74* 2.24*  CALCIUM 8.5 8.6   No results found for this basename: AST, ALT, ALKPHOS, BILITOT, PROT, ALBUMIN,  in the last 72 hours  Recent Labs  07/16/14 1305 07/17/14 0508  WBC 10.6* 11.0*  HGB 10.9* 11.0*  HCT 33.2* 33.8*  MCV 89.7 89.7  PLT 179 185    Recent Labs  07/16/14 1806 07/16/14 2253 07/17/14 0508  TROPONINI <0.30 <0.30 <0.30   No components found with this basename: POCBNP,  No results found for this basename: HGBA1C,  in the last 72 hours   Radiology/Studies:  Dg Chest 2 View  07/16/2014   CLINICAL DATA:  Shortness of breath.  EXAM: CHEST  2 VIEW  COMPARISON:  Single view of the chest 06/27/2014 and 06/25/2014.  FINDINGS: Tiny bilateral pleural effusions are seen. There is cardiomegaly and mild interstitial edema. No pneumothorax or consolidative process is identified.  IMPRESSION: Cardiomegaly and mild interstitial edema.   Electronically Signed   By: Inge Rise M.D.   On: 07/16/2014 13:35   Dg Chest Port 1 View  06/27/2014  CLINICAL DATA:  Pulmonary edema.  EXAM: PORTABLE CHEST - 1 VIEW  COMPARISON:  Multiple recent previous exams.  FINDINGS: The diffuse interstitial and basilar alveolar opacity persists and is slightly improved in the interval. The cardio pericardial silhouette is enlarged. Right IJ central line has been removed. Telemetry leads overlie the chest.  IMPRESSION: Interval improvement in pulmonary edema pattern with removal of the right IJ central line.   Electronically Signed   By: Misty Stanley M.D.   On: 06/27/2014 07:39   Dg Chest Port 1 View  06/26/2014   CLINICAL DATA:  Followup edema  EXAM: PORTABLE CHEST - 1 VIEW  COMPARISON:  06/25/2014  FINDINGS:  Endotracheal tube removed.  Central venous catheter tip in the SVC.  Interval improvement in pulmonary edema. Improvement in bibasilar atelectasis which remains. Small right effusion has improved.  IMPRESSION: Significant improvement in pulmonary edema. Mild bibasilar atelectasis remains.  Endotracheal tube removed.   Electronically Signed   By: Franchot Gallo M.D.   On: 06/26/2014 07:26   Dg Chest Port 1 View  06/25/2014   CLINICAL DATA:  Central line placement.  EXAM: PORTABLE CHEST - 1 VIEW  COMPARISON:  06/25/2014.  FINDINGS: The endotracheal tube and NG tubes are stable. There is a new right IJ central venous catheter with its tip at the level of the carina in the region of the mid SVC. The heart and lungs are stable.  IMPRESSION: Right IJ central venous catheter in good position with its tip in the mid SVC. No complicating features such as pneumothorax.  Stable mild cardiac enlargement, pulmonary vascular congestion and possible mild edema.   Electronically Signed   By: Kalman Jewels M.D.   On: 06/25/2014 12:27   Dg Chest Port 1 View  06/25/2014   CLINICAL DATA:  Evaluate endotracheal tube, airspace disease  EXAM: PORTABLE CHEST - 1 VIEW  COMPARISON:  06/24/2014; 11/29/2012; 07/09/2011  FINDINGS: Grossly unchanged enlarged cardiac silhouette and mediastinal contours given persistently reduced lung volumes and patient rotation. Atherosclerotic plaque within the thoracic aorta. Stable position of support apparatus. No pneumothorax. Worsening bilateral mid and lower lung heterogeneous opacities, right greater than left. Pulmonary vasculature remains indistinct with cephalization of flow. Trace bilateral effusions are not excluded, right greater than left. Grossly unchanged bones.  IMPRESSION: 1.  Stable positioning of support apparatus.  No pneumothorax. 2. Findings most suggestive of worsening pulmonary edema and bibasilar opacities, atelectasis versus infiltrate.   Electronically Signed   By: Sandi Mariscal M.D.   On: 06/25/2014 07:55   Dg Chest Portable 1 View  06/24/2014   CLINICAL DATA:  SHORTNESS OF BREATH  EXAM: PORTABLE CHEST - 1 VIEW  COMPARISON:  11/29/2012  FINDINGS: Endotracheal tube has been placed, tip approximately 5.8 cm above carina. Atheromatous aorta. Heart size upper limits normal. Moderate interstitial and airspace opacities in a predominantly perihilar and infrahilar distribution, increased since previous exam, right greater than left. No effusion. Visualized skeletal structures are unremarkable.  IMPRESSION: 1. Endotracheal tube tip 5.8 cm above carina. 2. Interval increase in asymmetric bilateral edema/infiltrates.   Electronically Signed   By: Arne Cleveland M.D.   On: 06/24/2014 11:38   Dg Abd Portable 1v  06/24/2014   CLINICAL DATA:  OG tube placement.  EXAM: PORTABLE ABDOMEN - 1 VIEW  COMPARISON:  None.  FINDINGS: The OG tube tip is in the distal stomach. No abnormal dilated loops of bowel. No suspicious radiopaque foreign bodies are soft tissue calcifications. Calcified atherosclerotic disease involves the  abdominal aorta and its branches.  IMPRESSION: 1. OG tube tip is in satisfactory position within the distal stomach.   Electronically Signed   By: Kerby Moors M.D.   On: 06/24/2014 20:59     Assessment and Plan  1. Acute on chronic respiratory insufficiency likely multifactorial from severe COPD/HF  2. Acute on chronic diastolic HF with some component of chronic LEE  - recent echo 6/15: EF 16-10%, grade 3 diastolic dysfunction, mod concentric LVH 3. Elevated POC troponin in the setting of AKI and CHF - f/u levels negative - unlikely to be ACS  4. AKI on CKD stage III, baseline appears 1.6-1.8 5. Tobacco abuse and alcoholism, counseled 6. Hypertension  7. CAD s/p PCI 2011 8. Persistent a-fib; not a candidate for anticoagulation with longstanding issues with noncompliance, imbalance and h/o alcoholism, plans to readdress candidacy for anticoag as outpatient if he  demonstrates compliance   Wt -1lb to 228 today. DC weight on 7/3 was 230lb. Consider changing Coreg to more selective beta blocker given severe COPD (pending PFTs once over the hump of acute illness). Will discuss further Lasix dosing with MD - question another dose of IV versus 40mg  BID (home dose previously 40mg  daily then 80mg  AM/40 PM for 3 days). Wonder if some component of volume overload due to oral prednisone for his recent COPD exacerbation, combined with high salt diet and drinking lots of water. Will ask dietician to see him to re-educate on CHF diet. Add salt/fluid restriction to his diet here. Mild temp elevation this admission - will defer further workup to IM.  Signed, Melina Copa PA-C Patient seen and examined and history reviewed. Agree with above findings and plan. Patient is improving. Afib rate is well controlled. Not a candidate for long term anticoagulation. Will go ahead and switch to po lasix 40 mg bid. Try and maintain negative fluid balance. Will also switch carvedilol to metoprolol for more selective beta blocker.   Peter Martinique, [place] 07/17/2014 11:49 AM

## 2014-07-17 NOTE — Care Management Note (Addendum)
    Page 1 of 1   07/19/2014     11:00:46 AM CARE MANAGEMENT NOTE 07/19/2014  Patient:  [name], [name]   Account Number:  [id number]  Date Initiated:  07/17/2014  Documentation initiated by:  [facility]  Subjective/Objective Assessment:   77 y.o. male with PMH of diastolic CHF, presumed COPD, HTN, A-fib and DM.  He present with a complaint of SOB.// Home alone.     Action/Plan:   1 dose of IV Lasix in ER- Cardiology to follow.// Access for [facility] services.   Anticipated DC Date:  07/18/2014   Anticipated DC Plan:  [place]  CM consult      [facility] Choice  Resumption Of Svcs/PTA Provider   Choice offered to / List presented to:          [facility] arranged  HH-1 RN  [place] [facility]      [place].   Status of service:  Completed, signed off Medicare Important Message given?  YES (If response is "NO", the following Medicare IM given date fields will be blank) Date Medicare IM given:  07/19/2014 Medicare IM given by:  [facility] Date Additional Medicare IM given:   Additional Medicare IM given by:    Discharge Disposition:    Per UR Regulation:  Reviewed for med. necessity/level of care/duration of stay  If discussed at [place] of Stay Meetings, dates discussed:    Comments:  07/17/14 1400 Conrado Nance [name], RN, BSN, [place] [phone] Pt currently active with [facility] for RN/PT services.  Resumption of care requested.  Janae Sauce of [facility] notified.  No DME needs identified at this time.

## 2014-07-17 NOTE — Progress Notes (Signed)
Inpatient Diabetes Program Recommendations  AACE/ADA: New Consensus Statement on Inpatient Glycemic Control (2013)  Target Ranges:  Prepandial:   less than 140 mg/dL      Peak postprandial:   less than 180 mg/dL (1-2 hours)      Critically ill patients:  140 - 180 mg/dL   Reason for Assessment:  Results for ELDIN, BONSELL (MRN [id number]) as of 07/17/2014 10:13  Ref. Range 07/16/2014 15:57 07/16/2014 20:59 07/17/2014 05:39  Glucose-Capillary Latest Range: 70-99 mg/dL 193 (H) 280 (H) 204 (H)   Diabetes history: Diabetes-Type 2 Outpatient Diabetes medications: Lantus 40 units daily, Regular 8 units bid Current orders for Inpatient glycemic control: Lantus 40 units daily  Consider the addition of Novolog moderate correction tid wc and HS.  Text page sent to MD.  Will follow.    Adah Perl, RN, BC-ADM Inpatient Diabetes Coordinator Pager [phone]

## 2014-07-18 ENCOUNTER — Ambulatory Visit: Payer: PRIVATE HEALTH INSURANCE | Admitting: Physician Assistant

## 2014-07-18 DIAGNOSIS — I219 Acute myocardial infarction, unspecified: Secondary | ICD-10-CM

## 2014-07-18 DIAGNOSIS — I5033 Acute on chronic diastolic (congestive) heart failure: Secondary | ICD-10-CM | POA: Diagnosis not present

## 2014-07-18 LAB — GLUCOSE, CAPILLARY
GLUCOSE-CAPILLARY: 303 mg/dL — AB (ref 70–99)
GLUCOSE-CAPILLARY: 239 mg/dL — AB (ref 70–99)
GLUCOSE-CAPILLARY: 168 mg/dL — AB (ref 70–99)
Glucose-Capillary: 112 mg/dL — ABNORMAL HIGH (ref 70–99)

## 2014-07-18 MED ORDER — IPRATROPIUM BROMIDE 0.02 % IN SOLN
0.5000 mg | Freq: Two times a day (BID) | RESPIRATORY_TRACT | Status: DC
  Administered 2014-07-18 – 2014-07-20 (×4): 0.5 mg via RESPIRATORY_TRACT
  Filled 2014-07-18 (×6): qty 2.5

## 2014-07-18 MED ORDER — LEVALBUTEROL HCL 0.63 MG/3ML IN NEBU
0.6300 mg | INHALATION_SOLUTION | Freq: Two times a day (BID) | RESPIRATORY_TRACT | Status: DC
  Administered 2014-07-18 – 2014-07-20 (×4): 0.63 mg via RESPIRATORY_TRACT
  Filled 2014-07-18 (×8): qty 3

## 2014-07-18 MED ORDER — INSULIN ASPART 100 UNIT/ML ~~LOC~~ SOLN
4.0000 [IU] | Freq: Three times a day (TID) | SUBCUTANEOUS | Status: DC
  Administered 2014-07-18 (×2): 4 [IU] via SUBCUTANEOUS

## 2014-07-18 MED ORDER — FUROSEMIDE 40 MG PO TABS
40.0000 mg | ORAL_TABLET | Freq: Two times a day (BID) | ORAL | Status: DC
  Administered 2014-07-18: 40 mg via ORAL
  Filled 2014-07-18 (×6): qty 1

## 2014-07-18 MED ORDER — INSULIN GLARGINE 100 UNIT/ML ~~LOC~~ SOLN
45.0000 [IU] | Freq: Every day | SUBCUTANEOUS | Status: DC
  Administered 2014-07-18: 45 [IU] via SUBCUTANEOUS
  Filled 2014-07-18 (×2): qty 0.45

## 2014-07-18 MED ORDER — FUROSEMIDE 40 MG PO TABS: 40.0000 mg | ORAL_TABLET | Freq: Two times a day (BID) | ORAL | Status: DC

## 2014-07-18 MED ORDER — NICOTINE 21 MG/24HR TD PT24
21.0000 mg | MEDICATED_PATCH | Freq: Every day | TRANSDERMAL | Status: DC
  Administered 2014-07-18 – 2014-07-20 (×3): 21 mg via TRANSDERMAL
  Filled 2014-07-18 (×3): qty 1

## 2014-07-18 MED ORDER — CARVEDILOL 12.5 MG PO TABS
12.5000 mg | ORAL_TABLET | Freq: Two times a day (BID) | ORAL | Status: DC
  Administered 2014-07-18 – 2014-07-20 (×5): 12.5 mg via ORAL
  Filled 2014-07-18 (×7): qty 1

## 2014-07-18 MED ORDER — NICOTINE 21 MG/24HR TD PT24: 21.0000 mg | MEDICATED_PATCH | Freq: Every day | TRANSDERMAL | Status: DC

## 2014-07-18 MED ORDER — LEVALBUTEROL HCL 0.63 MG/3ML IN NEBU: 0.6300 mg | INHALATION_SOLUTION | Freq: Four times a day (QID) | RESPIRATORY_TRACT | Status: DC | PRN

## 2014-07-18 NOTE — Progress Notes (Signed)
Patient ID: [name]  male  JXB:[id number]    DOB: 10/07/1936    DOA: 07/16/2014  PCP: Jilda Panda, MD  Assessment/Plan: Principal Problem:   Acute respiratory failure secondary to acute CHF and COPD exacerbation - Patient feels somewhat improving, continue Xopenex and Atrovent nebs, prednisone - Continue O2, doxycycline - Will need home O2 evaluation prior to dc - Patient not on Lasix, reviewed cardiology note, IV Lasix was discontinued, placed back on oral Lasix 40 mg twice a day, maintain negative fluid balance - Patient also on spironolactone   Active Problems:   DM (diabetes mellitus), type 2 with renal complications Uncontrolled, placed on NovoLog 4 units TID, increase Lantus to 45 units, continue sliding scale insulin     HYPERLIPIDEMIA - Continue statin    HYPERTENSION - Currently stable  Atrial fibrillation - Currently rate controlled, not a Coumadin candidate due to noncompliance and alcohol use  CKD STAGE III - Continue monitoring creatinine with diuretics  DVT Prophylaxis:Heparin subcutaneous   Code Status:Code   Family Communication:Discussed with the patient   Disposition:  Consultants:  Cardiology   Procedures: None  Antibiotics:  Doxycycline   Subjective: Patient seen and examined, states slowly improving, no acute chest pain or shortness of breath, fevers or chills   Objective: Weight change: -1.169 kg (-2 lb 9.3 oz)  Intake/Output Summary (Last 24 hours) at 07/18/14 1057 Last data filed at 07/18/14 1016  Gross per 24 hour  Intake    870 ml  Output   1475 ml  Net   -605 ml   Blood pressure 138/60, pulse 92, temperature 97.5 F (36.4 C), temperature source Oral, resp. rate 20, height 6\' 2"  (1.88 m), weight 102.83 kg (226 lb 11.2 oz), SpO2 99.00%.  Physical Exam: General: Alert and awake, oriented x3, not in any acute distress. CVS: Irregularly irregular Chest: Diminished all over Abdomen: soft nontender, nondistended, normal  bowel sounds  Extremities: no cyanosis, clubbing, 2+ edema noted bilaterally Neuro: Cranial nerves II-XII intact, no focal neurological deficits  Lab Results: Basic Metabolic Panel:  Recent Labs Lab 07/16/14 1305 07/17/14 0508  NA 137 135*  K 3.6* 4.1  CL 94* 94*  CO2 30 27  GLUCOSE 221* 206*  BUN 43* 43*  CREATININE 2.74* 2.24*  CALCIUM 8.5 8.6   Liver Function Tests: No results found for this basename: AST, ALT, ALKPHOS, BILITOT, PROT, ALBUMIN,  in the last 168 hours No results found for this basename: LIPASE, AMYLASE,  in the last 168 hours No results found for this basename: AMMONIA,  in the last 168 hours CBC:  Recent Labs Lab 07/16/14 1305 07/17/14 0508  WBC 10.6* 11.0*  HGB 10.9* 11.0*  HCT 33.2* 33.8*  MCV 89.7 89.7  PLT 179 185   Cardiac Enzymes:  Recent Labs Lab 07/16/14 1806 07/16/14 2253 07/17/14 0508  TROPONINI <0.30 <0.30 <0.30   BNP: No components found with this basename: POCBNP,  CBG:  Recent Labs Lab 07/17/14 0539 07/17/14 1202 07/17/14 1621 07/17/14 2049 07/18/14 0647  GLUCAP 204* 290* 368* 336* 303*     Micro Results: No results found for this or any previous visit (from the past 240 hour(s)).  Studies/Results: Dg Chest 2 View  07/16/2014   CLINICAL DATA:  Shortness of breath.  EXAM: CHEST  2 VIEW  COMPARISON:  Single view of the chest 06/27/2014 and 06/25/2014.  FINDINGS: Tiny bilateral pleural effusions are seen. There is cardiomegaly and mild interstitial edema. No pneumothorax or consolidative process is identified.  IMPRESSION: Cardiomegaly and mild interstitial edema.   Electronically Signed   By: Inge Rise M.D.   On: 07/16/2014 13:35   Dg Chest Port 1 View  06/27/2014   CLINICAL DATA:  Pulmonary edema.  EXAM: PORTABLE CHEST - 1 VIEW  COMPARISON:  Multiple recent previous exams.  FINDINGS: The diffuse interstitial and basilar alveolar opacity persists and is slightly improved in the interval. The cardio pericardial  silhouette is enlarged. Right IJ central line has been removed. Telemetry leads overlie the chest.  IMPRESSION: Interval improvement in pulmonary edema pattern with removal of the right IJ central line.   Electronically Signed   By: Misty Stanley M.D.   On: 06/27/2014 07:39   Dg Chest Port 1 View  06/26/2014   CLINICAL DATA:  Followup edema  EXAM: PORTABLE CHEST - 1 VIEW  COMPARISON:  06/25/2014  FINDINGS: Endotracheal tube removed.  Central venous catheter tip in the SVC.  Interval improvement in pulmonary edema. Improvement in bibasilar atelectasis which remains. Small right effusion has improved.  IMPRESSION: Significant improvement in pulmonary edema. Mild bibasilar atelectasis remains.  Endotracheal tube removed.   Electronically Signed   By: Franchot Gallo M.D.   On: 06/26/2014 07:26   Dg Chest Port 1 View  06/25/2014   CLINICAL DATA:  Central line placement.  EXAM: PORTABLE CHEST - 1 VIEW  COMPARISON:  06/25/2014.  FINDINGS: The endotracheal tube and NG tubes are stable. There is a new right IJ central venous catheter with its tip at the level of the carina in the region of the mid SVC. The heart and lungs are stable.  IMPRESSION: Right IJ central venous catheter in good position with its tip in the mid SVC. No complicating features such as pneumothorax.  Stable mild cardiac enlargement, pulmonary vascular congestion and possible mild edema.   Electronically Signed   By: Kalman Jewels M.D.   On: 06/25/2014 12:27   Dg Chest Port 1 View  06/25/2014   CLINICAL DATA:  Evaluate endotracheal tube, airspace disease  EXAM: PORTABLE CHEST - 1 VIEW  COMPARISON:  06/24/2014; 11/29/2012; 07/09/2011  FINDINGS: Grossly unchanged enlarged cardiac silhouette and mediastinal contours given persistently reduced lung volumes and patient rotation. Atherosclerotic plaque within the thoracic aorta. Stable position of support apparatus. No pneumothorax. Worsening bilateral mid and lower lung heterogeneous opacities,  right greater than left. Pulmonary vasculature remains indistinct with cephalization of flow. Trace bilateral effusions are not excluded, right greater than left. Grossly unchanged bones.  IMPRESSION: 1.  Stable positioning of support apparatus.  No pneumothorax. 2. Findings most suggestive of worsening pulmonary edema and bibasilar opacities, atelectasis versus infiltrate.   Electronically Signed   By: Sandi Mariscal M.D.   On: 06/25/2014 07:55   Dg Chest Portable 1 View  06/24/2014   CLINICAL DATA:  SHORTNESS OF BREATH  EXAM: PORTABLE CHEST - 1 VIEW  COMPARISON:  11/29/2012  FINDINGS: Endotracheal tube has been placed, tip approximately 5.8 cm above carina. Atheromatous aorta. Heart size upper limits normal. Moderate interstitial and airspace opacities in a predominantly perihilar and infrahilar distribution, increased since previous exam, right greater than left. No effusion. Visualized skeletal structures are unremarkable.  IMPRESSION: 1. Endotracheal tube tip 5.8 cm above carina. 2. Interval increase in asymmetric bilateral edema/infiltrates.   Electronically Signed   By: Arne Cleveland M.D.   On: 06/24/2014 11:38   Dg Abd Portable 1v  06/24/2014   CLINICAL DATA:  OG tube placement.  EXAM: PORTABLE ABDOMEN - 1 VIEW  COMPARISON:  None.  FINDINGS: The OG tube tip is in the distal stomach. No abnormal dilated loops of bowel. No suspicious radiopaque foreign bodies are soft tissue calcifications. Calcified atherosclerotic disease involves the abdominal aorta and its branches.  IMPRESSION: 1. OG tube tip is in satisfactory position within the distal stomach.   Electronically Signed   By: Kerby Moors M.D.   On: 06/24/2014 20:59    Medications: Scheduled Meds: . aspirin  325 mg Oral Daily  . atorvastatin  20 mg Oral QHS  . carvedilol  12.5 mg Oral BID WC  . cilostazol  50 mg Oral BID  . diltiazem  120 mg Oral Daily  . divalproex  500 mg Oral BID  . doxycycline  100 mg Oral Q12H  . finasteride  5 mg  Oral Daily  . gabapentin  300 mg Oral TID  . heparin  5,000 Units Subcutaneous 3 times per day  . insulin aspart  0-15 Units Subcutaneous TID WC  . insulin aspart  0-5 Units Subcutaneous QHS  . insulin aspart  4 Units Subcutaneous TID WC  . insulin glargine  45 Units Subcutaneous Daily  . ipratropium  0.5 mg Nebulization BID  . levalbuterol  0.63 mg Nebulization BID  . lisinopril  40 mg Oral Daily  . nicotine  14 mg Transdermal Daily  . predniSONE  20 mg Oral Daily  . sodium chloride  3 mL Intravenous Q12H  . spironolactone  25 mg Oral Daily  . tamsulosin  0.4 mg Oral Daily  . Travoprost (BAK Free)  1 drop Both Eyes QHS      LOS: 2 days   RAI,RIPUDEEP M.D. [facility] 07/18/2014, 10:57 AM Pager: [phone]  If 7PM-7AM, please contact night-coverage [email or web address] Password [email or web address]  **Disclaimer: This note was dictated with voice recognition software. Similar sounding words can inadvertently be transcribed and this note may contain transcription errors which may not have been corrected upon publication of note.**

## 2014-07-18 NOTE — ED Provider Notes (Signed)
I saw and evaluated the patient, reviewed the resident's note and I agree with the findings and plan.   EKG Interpretation None     Patient with shortness of breath. Appears to be CHF related. Minimally elevated troponin likely them and related. Will admit to internal medicine  Jasper Riling. Alvino Chapel, MD 07/18/14 1324

## 2014-07-18 NOTE — Progress Notes (Signed)
PT Cancellation Note  Patient Details Name: GREYSIN MEDLEN MRN: [id number] DOB: October 22, 1936   Cancelled Treatment:    Reason Eval/Treat Not Completed: Fatigue/lethargy limiting ability to participate. Pt states people have been in and out of his room all morning and does not feel as if he can participate at this time. Will check back as schedule allows.    Jolyn Lent 07/18/2014, 11:30 PM

## 2014-07-18 NOTE — Progress Notes (Addendum)
Patient Name: QUINTEZ MASELLI Date of Encounter: 07/18/2014     Principal Problem:   Acute respiratory failure Active Problems:   DM (diabetes mellitus), type 2 with renal complications   HYPERLIPIDEMIA   HYPERTENSION   CAD   PVD   Atrial fibrillation   Diastolic CHF, chronic   Acute respiratory failure with hypoxemia   Acute on chronic diastolic congestive heart failure   CKD (chronic kidney disease), stage III    SUBJECTIVE  Pt feels well this morning.  Denies SOB, chest pain, nausea, vomiting, lightheadedness, dizziness.  He had some left heel pain this morning but after propping his foot up on a pillow his pain went away.  Legs are chronically swollen.  Met with nutritionist this morning, pt feels like low salt diet is a death sentence.  Discussed consequences of not adhering to medications, fluid and salt restriction.   CURRENT MEDS . aspirin  325 mg Oral Daily  . atorvastatin  20 mg Oral QHS  . carvedilol  12.5 mg Oral BID WC  . cilostazol  50 mg Oral BID  . diltiazem  120 mg Oral Daily  . divalproex  500 mg Oral BID  . doxycycline  100 mg Oral Q12H  . finasteride  5 mg Oral Daily  . gabapentin  300 mg Oral TID  . heparin  5,000 Units Subcutaneous 3 times per day  . insulin aspart  0-15 Units Subcutaneous TID WC  . insulin aspart  0-5 Units Subcutaneous QHS  . insulin aspart  4 Units Subcutaneous TID WC  . insulin glargine  45 Units Subcutaneous Daily  . ipratropium  0.5 mg Nebulization BID  . levalbuterol  0.63 mg Nebulization BID  . lisinopril  40 mg Oral Daily  . nicotine  14 mg Transdermal Daily  . predniSONE  20 mg Oral Daily  . sodium chloride  3 mL Intravenous Q12H  . spironolactone  25 mg Oral Daily  . tamsulosin  0.4 mg Oral Daily  . Travoprost (BAK Free)  1 drop Both Eyes QHS    OBJECTIVE  Filed Vitals:   07/17/14 2054 07/18/14 0315 07/18/14 0650 07/18/14 0752  BP:   145/67   Pulse:   85   Temp: 99.2 F (37.3 C)  97.7 F (36.5 C)     TempSrc: Oral  Oral   Resp:   18   Height:      Weight:  226 lb 11.2 oz (102.83 kg)    SpO2:   96% 96%    Intake/Output Summary (Last 24 hours) at 07/18/14 0958 Last data filed at 07/18/14 0201  Gross per 24 hour  Intake    720 ml  Output   1225 ml  Net   -505 ml   Filed Weights   07/16/14 1552 07/17/14 0537 07/18/14 0315  Weight: 229 lb 4.5 oz (104 kg) 228 lb 15.5 oz (103.86 kg) 226 lb 11.2 oz (102.83 kg)    PHYSICAL EXAM  General: Pleasant, NAD. Neuro: Alert and oriented X 3. Moves all extremities spontaneously. Psych: Normal affect. HEENT:  Normal  Neck: Supple without bruits or JVD. Lungs:  Bibasilar rales, decreased beath sounds bilaterally. Resp regular and unlabored Heart: irregular rhythm, no s3, s4, or murmurs. Abdomen: Soft, non-tender, distended, BS + x 4.  Extremities: No clubbing, cyanosis. 2+ pitting edema in LEs bilaterally.Marland Kitchen DP/PT/Radials 2+ and equal bilaterally. Skin: pt has some hyperpigmentation on his medial left ankle.    Accessory Clinical Findings  CBC  Recent  Labs  07/16/14 1305 07/17/14 0508  WBC 10.6* 11.0*  HGB 10.9* 11.0*  HCT 33.2* 33.8*  MCV 89.7 89.7  PLT 179 520   Basic Metabolic Panel  Recent Labs  07/16/14 1305 07/17/14 0508  NA 137 135*  K 3.6* 4.1  CL 94* 94*  CO2 30 27  GLUCOSE 221* 206*  BUN 43* 43*  CREATININE 2.74* 2.24*  CALCIUM 8.5 8.6   Cardiac Enzymes  Recent Labs  07/16/14 1806 07/16/14 2253 07/17/14 0508  TROPONINI <0.30 <0.30 <0.30    TELE  Rate controlled a-fib  W/ two 7-8 beat runs of NSVT  Radiology/Studies  Dg Chest 2 View  07/16/2014   CLINICAL DATA:  Shortness of breath.  EXAM: CHEST  2 VIEW  COMPARISON:  Single view of the chest 06/27/2014 and 06/25/2014.  FINDINGS: Tiny bilateral pleural effusions are seen. There is cardiomegaly and mild interstitial edema. No pneumothorax or consolidative process is identified.  IMPRESSION: Cardiomegaly and mild interstitial edema.   Electronically  Signed   By: Inge Rise M.D.   On: 07/16/2014 13:35   Dg Chest Port 1 View  06/27/2014   CLINICAL DATA:  Pulmonary edema.  EXAM: PORTABLE CHEST - 1 VIEW  COMPARISON:  Multiple recent previous exams.  FINDINGS: The diffuse interstitial and basilar alveolar opacity persists and is slightly improved in the interval. The cardio pericardial silhouette is enlarged. Right IJ central line has been removed. Telemetry leads overlie the chest.  IMPRESSION: Interval improvement in pulmonary edema pattern with removal of the right IJ central line.   Electronically Signed   By: Misty Stanley M.D.   On: 06/27/2014 07:39    ASSESSMENT AND PLAN  78 yo male with PMH significant for tobacco abuse, alcoholism, a-fib, HTN, CAD (NSETEMI 2011 with pci, EF 50-55% and hypokinesis of the pericardium, stage III diastolic dysfunction), PAD, bipolar, CKD, chronic diastolic CHF who presented to [facility] on 07/16/14 on with dyspnea, O2 saturation dropping into the 80s with exertion and decreased urine output.  He was recently admitted on June 28th 2015 for hypercapnic respiratory failure and hypertensive emergency.  Acute on chronic respiratory insufficiency:  likely multifactorial from severe COPD/HF   Acute on chronic diastolic HF: with some component of chronic LEE.  Possible exacerbation in the setting of prednisone for severe COPD. Pro BNP 6079  - recent echo 6/15: EF 80-22%, grade 3 diastolic dysfunction, mod concentric LVH  - on Lasix 33m bid and spironolactone 277mqd. Net neg 93026mPersistent a-fib: well controlled. Not a candidate for anticoagulation with longstanding issues with noncompliance, imbalance and h/o alcoholism - reassess as outpatient   Elevated POC troponin in the setting of AKI and CHF -unlikely to be ACS - POC troponin 0.16 on admission. Troponins now negative.  AKI on CKD stage III:  baseline appears 1.6-1.8  - SCr today 2.24 down from 2.74  Tobacco abuse and alcoholism: counseled    Hypertension: Recommended to switch coreg to metoprolol in the setting of severe COPD   CAD s/p PCI 2011  NSVT- W/ two 7-8 beat runs of NSVT on telemetry - he is on a BB currently. Continue to monitor   Signed, JulAnson Crofts-S  Signed, STEPerry Mount-C  Pager [phone] seen and examined and history reviewed. Agree with above findings and plan. Negative fluid balance. Now on oral lasix and aldactone. Reinforced need for sodium restriction. One episode of NSVT noted on monitor without symptoms. Afib rate is well controlled on metoprolol.  Continue current therapy   Imani Fiebelkorn [place], [place] 07/18/2014 1:40 PM

## 2014-07-18 NOTE — Progress Notes (Addendum)
**Note De-Identified  Obfuscation** RT protocol assessment completed.  Patient states that he does not need HHN that he is feeling better and has no SOB on rest and/or exertion.  CXR 07/18/14 mild interstitial edema.  BBS clr and mildly diminished in bases.  SAT 96% on 2L [place].  Xopenex and Atovent HHN schedule changed to BID.  Xopenex Q6 PRN for wheezing.

## 2014-07-18 NOTE — Progress Notes (Signed)
Nutrition Education Note  RD consulted for nutrition education regarding reinforcement on low salt, fluid restricted diet for CHF.  RD met with patient earlier this month and provided low sodium diet education. Pt states that MD's and RN's are telling him that he shouldn't add any salt to his food and he expresses confusion about this as he knows he is allowed 2000 mg of sodium daily. RD discussed with patient that almost all foods have some sodium naturally. Discussed sodium content of common foods in each food group.  Reviewed high sodium foods in each food group and discouraged intake of processed foods.  RD provided "Heart Failure Nutrition Therapy" handout from the Academy of [employer]. Encouraged fresh fruits and vegetables as well as whole grain sources of carbohydrates to maximize fiber intake.  Pt states that he will just plan on no longer eating breakfast since he usually eats eggs, bacon or sausage, and biscuits. RD assisted patient in planning low sodium breakfast ideas; pt seemed pleased with options. Reviewed patient's dietary recall and provided examples on ways to decrease sodium intake in diet. Provided additional sample menus.   RD discussed why it is important for patient to adhere to diet recommendations, and emphasized the role of fluids, foods to avoid, and importance of weighing self daily. Teach back method used.  Expect good compliance.  Body mass index is 29.09 kg/(m^2). Pt meets criteria for Overweight based on current BMI.  Current diet order is Heart Healthy/Carb Modified, patient is consuming approximately 100% of meals at this time. Labs and medications reviewed. No further nutrition interventions warranted at this time. Encouraged family to request a return visit from clinical nutrition staff via RN if additional questions present.  If additional nutrition issues arise, please re-consult RD.   Pryor Ochoa RD, LDN Inpatient Clinical Dietitian Pager:  [phone] After Hours Pager: [phone]

## 2014-07-18 NOTE — Evaluation (Signed)
Physical Therapy Evaluation Patient Details Name: [name] MRN: [id number] DOB: 1936/06/05 Today's Date: 07/18/2014   History of Present Illness  Pt is a 78 y/o male admitted with acute respiratory failure secondary to acute CHF and COPD exacerbation  Clinical Impression  Pt admitted with the above. Pt currently with functional limitations due to the deficits listed below (see PT Problem List). At the time of PT eval pt was able to perform transfers and ambulation with RW and close guarding. Feel pt will be safer with use of RW all the time. Pt will benefit from skilled PT to increase their independence and safety with mobility to allow discharge to the venue listed below.      Follow Up Recommendations Home health PT;Supervision - Intermittent    Equipment Recommendations  None recommended by PT    Recommendations for Other Services OT consult     Precautions / Restrictions Precautions Precautions: Fall Restrictions Weight Bearing Restrictions: No      Mobility  Bed Mobility Overal bed mobility: Needs Assistance Bed Mobility: Sit to Supine       Sit to supine: Supervision   General bed mobility comments: Very slow and significant amount of increased time required for pt to transition to supine. use of bed rails required however no physical assist from therapist was needed.   Transfers Overall transfer level: Needs assistance Equipment used: Rolling walker (2 wheeled) Transfers: Sit to/from Stand Sit to Stand: Supervision         General transfer comment: VC's for hand placement on seated surface for safety.   Ambulation/Gait Ambulation/Gait assistance: Min guard Ambulation Distance (Feet): 60 Feet Assistive device: Rolling walker (2 wheeled) Gait Pattern/deviations: Step-through pattern;Decreased stride length;Trunk flexed Gait velocity: Decreased Gait velocity interpretation: Below normal speed for age/gender General Gait Details: After 30 feet pt  asking to turn around to go back to the room, stating "I don't want to overdo it." Overall steady. No LOB noted and no physical assist required.   Stairs            Wheelchair Mobility    Modified Rankin (Stroke Patients Only)       Balance Overall balance assessment: Needs assistance Sitting-balance support: Feet supported;No upper extremity supported Sitting balance-Leahy Scale: Good     Standing balance support: Bilateral upper extremity supported;During functional activity Standing balance-Leahy Scale: Fair Standing balance comment: Pt states he has been ambulating to/from bathroom without an AD                             Pertinent Vitals/Pain Pt on RA throughout session. At rest with supplemental O2 sats at 94%. At rest on RA sats at 92%. After ambulation on RA sats at 90%. Increased to 94% with seated rest break.     Home Living Family/patient expects to be discharged to:: Private residence Living Arrangements: Alone Available Help at Discharge: Available PRN/intermittently;Friend(s) Type of Home: Apartment Home Access: Level entry     Home Layout: [place]: [place] - 4 wheels;Cane - single point;Grab bars - tub/shower;Grab bars - toilet Additional Comments: Lives in a senior apartments independent living    Prior Function Level of Independence: Independent with assistive device(s)   Gait / Transfers Assistance Needed: Uses cane most of the time but just started using the rollator.  ADL's / Homemaking Assistance Needed: has aide to help with bathing and dressing as needed  Comments: pt has aide 7 days  a week for 2.5 hours per day.     Hand Dominance   Dominant Hand: Right    Extremity/Trunk Assessment   Upper Extremity Assessment: Defer to OT evaluation           Lower Extremity Assessment: Overall WFL for tasks assessed      Cervical / Trunk Assessment: Kyphotic  Communication   Communication: No difficulties   Cognition Arousal/Alertness: Awake/alert Behavior During Therapy: WFL for tasks assessed/performed Overall Cognitive Status: Within Functional Limits for tasks assessed                      General Comments      Exercises        Assessment/Plan    PT Assessment Patient needs continued PT services  PT Diagnosis Abnormality of gait;Generalized weakness   PT Problem List Decreased mobility;Decreased activity tolerance;Decreased balance;Pain  PT Treatment Interventions DME instruction;Gait training;Therapeutic activities;Therapeutic exercise;Functional mobility training;Balance training;Neuromuscular re-education;Patient/family education   PT Goals (Current goals can be found in the Care Plan section) Acute Rehab PT Goals Patient Stated Goal: to go home today PT Goal Formulation: With patient Time For Goal Achievement: 08/01/14 Potential to Achieve Goals: Good    Frequency Min 3X/week   Barriers to discharge        Co-evaluation               End of Session Equipment Utilized During Treatment: Gait belt;Oxygen Activity Tolerance: Patient tolerated treatment well Patient left: in bed;with call bell/phone within reach;with family/visitor present Nurse Communication: Mobility status         Time: 4166-0630 PT Time Calculation (min): 31 min   Charges:   PT Evaluation $Initial PT Evaluation Tier I: 1 Procedure PT Treatments $Gait Training: 8-22 mins $Therapeutic Activity: 8-22 mins   PT G Codes:          Jolyn Lent 07/18/2014, 5:22 PM  Jolyn Lent, PT, DPT Acute Rehabilitation Services Pager: [phone]

## 2014-07-19 DIAGNOSIS — I5033 Acute on chronic diastolic (congestive) heart failure: Secondary | ICD-10-CM | POA: Diagnosis not present

## 2014-07-19 LAB — BASIC METABOLIC PANEL
Anion gap: 10 (ref 5–15)
BUN: 38 mg/dL — AB (ref 6–23)
CO2: 33 meq/L — AB (ref 19–32)
Calcium: 9.5 mg/dL (ref 8.4–10.5)
Chloride: 98 mEq/L (ref 96–112)
Creatinine, Ser: 1.55 mg/dL — ABNORMAL HIGH (ref 0.50–1.35)
GFR calc Af Amer: 49 mL/min — ABNORMAL LOW (ref >90)
GFR calc non Af Amer: 42 mL/min — ABNORMAL LOW (ref >90)
GLUCOSE: 208 mg/dL — AB (ref 70–99)
Potassium: 5.1 mEq/L (ref 3.7–5.3)
SODIUM: 141 meq/L (ref 137–147)

## 2014-07-19 LAB — GLUCOSE, CAPILLARY
GLUCOSE-CAPILLARY: 210 mg/dL — AB (ref 70–99)
GLUCOSE-CAPILLARY: 113 mg/dL — AB (ref 70–99)
GLUCOSE-CAPILLARY: 147 mg/dL — AB (ref 70–99)
GLUCOSE-CAPILLARY: 190 mg/dL — AB (ref 70–99)

## 2014-07-19 MED ORDER — FUROSEMIDE 40 MG PO TABS
40.0000 mg | ORAL_TABLET | Freq: Three times a day (TID) | ORAL | Status: DC
  Filled 2014-07-19 (×2): qty 1

## 2014-07-19 MED ORDER — INSULIN ASPART 100 UNIT/ML ~~LOC~~ SOLN
4.0000 [IU] | Freq: Three times a day (TID) | SUBCUTANEOUS | Status: DC
  Administered 2014-07-19 – 2014-07-20 (×5): 4 [IU] via SUBCUTANEOUS

## 2014-07-19 MED ORDER — FUROSEMIDE 40 MG PO TABS
40.0000 mg | ORAL_TABLET | Freq: Two times a day (BID) | ORAL | Status: DC
  Administered 2014-07-19 – 2014-07-20 (×3): 40 mg via ORAL
  Filled 2014-07-19 (×5): qty 1

## 2014-07-19 MED ORDER — INSULIN GLARGINE 100 UNIT/ML ~~LOC~~ SOLN
50.0000 [IU] | Freq: Every day | SUBCUTANEOUS | Status: DC
  Administered 2014-07-19 – 2014-07-20 (×2): 50 [IU] via SUBCUTANEOUS
  Filled 2014-07-19 (×2): qty 0.5

## 2014-07-19 NOTE — Progress Notes (Signed)
Pt. Walked in halls without 02 and range 95%-99%

## 2014-07-19 NOTE — Progress Notes (Signed)
Patient ID: [name]  male  AJG:[id number]    DOB: [birth year]    DOA: 07/16/2014  PCP: Jilda Panda, MD  Assessment/Plan:    Acute respiratory failure secondary to acute CHF and COPD exacerbation - Patient feels somewhat improving, continue Xopenex and Atrovent nebs, prednisone - Continue O2, doxycycline - home O2 evaluation prior to dc - placed back on oral Lasix 40 mg twice a day, maintain negative fluid balance - Patient also on spironolactone     DM (diabetes mellitus), type 2 with renal complications Uncontrolled, placed on NovoLog 4 units TID, increase Lantus to 50 units, continue sliding scale insulin     HYPERLIPIDEMIA - Continue statin    HYPERTENSION - Currently stable  Atrial fibrillation - Currently rate controlled, not a Coumadin candidate due to noncompliance and alcohol use  AKI on CKD STAGE III -improved- baseline 1.7 - Continue monitoring creatinine with diuretics  DVT Prophylaxis:Heparin subcutaneous   Code Status:Code   Family Communication:Discussed with the patient   Disposition: 1-2 days  Consultants:  Cardiology   Procedures: None  Antibiotics:  Doxycycline   Subjective: Feeling better Still with scrotal swelling  Objective: Weight change: -1.089 kg (-2 lb 6.4 oz)  Intake/Output Summary (Last 24 hours) at 07/19/14 0843 Last data filed at 07/19/14 0448  Gross per 24 hour  Intake    750 ml  Output   3650 ml  Net  -2900 ml   Blood pressure 147/80, pulse 99, temperature 97.4 F (36.3 C), temperature source Oral, resp. rate 20, height 6\' 2"  (1.88 m), weight 101.742 kg (224 lb 4.8 oz), SpO2 97.00%.  Physical Exam: General: Alert and awake, oriented x3, not in any acute distress. CVS: Irregularly irregular Chest: Diminished all over Abdomen: soft nontender, nondistended, normal bowel sounds  Extremities: no cyanosis, clubbing, 2+ edema noted bilaterally Neuro: Cranial nerves II-XII intact, no focal neurological  deficits  Lab Results: Basic Metabolic Panel:  Recent Labs Lab 07/17/14 0508 07/19/14 0324  NA 135* 141  K 4.1 5.1  CL 94* 98  CO2 27 33*  GLUCOSE 206* 208*  BUN 43* 38*  CREATININE 2.24* 1.55*  CALCIUM 8.6 9.5   Liver Function Tests: No results found for this basename: AST, ALT, ALKPHOS, BILITOT, PROT, ALBUMIN,  in the last 168 hours No results found for this basename: LIPASE, AMYLASE,  in the last 168 hours No results found for this basename: AMMONIA,  in the last 168 hours CBC:  Recent Labs Lab 07/16/14 1305 07/17/14 0508  WBC 10.6* 11.0*  HGB 10.9* 11.0*  HCT 33.2* 33.8*  MCV 89.7 89.7  PLT 179 185   Cardiac Enzymes:  Recent Labs Lab 07/16/14 1806 07/16/14 2253 07/17/14 0508  TROPONINI <0.30 <0.30 <0.30   BNP: No components found with this basename: POCBNP,  CBG:  Recent Labs Lab 07/18/14 0647 07/18/14 1153 07/18/14 1624 07/18/14 2231 07/19/14 0559  GLUCAP 303* 239* 112* 168* 210*     Micro Results: No results found for this or any previous visit (from the past 240 hour(s)).  Studies/Results: Dg Chest 2 View  07/16/2014   CLINICAL DATA:  Shortness of breath.  EXAM: CHEST  2 VIEW  COMPARISON:  Single view of the chest 06/27/2014 and 06/25/2014.  FINDINGS: Tiny bilateral pleural effusions are seen. There is cardiomegaly and mild interstitial edema. No pneumothorax or consolidative process is identified.  IMPRESSION: Cardiomegaly and mild interstitial edema.   Electronically Signed   By: Inge Rise M.D.   On: 07/16/2014 13:35  Dg Chest Port 1 View  06/27/2014   CLINICAL DATA:  Pulmonary edema.  EXAM: PORTABLE CHEST - 1 VIEW  COMPARISON:  Multiple recent previous exams.  FINDINGS: The diffuse interstitial and basilar alveolar opacity persists and is slightly improved in the interval. The cardio pericardial silhouette is enlarged. Right IJ central line has been removed. Telemetry leads overlie the chest.  IMPRESSION: Interval improvement in  pulmonary edema pattern with removal of the right IJ central line.   Electronically Signed   By: Misty Stanley M.D.   On: 06/27/2014 07:39   Dg Chest Port 1 View  06/26/2014   CLINICAL DATA:  Followup edema  EXAM: PORTABLE CHEST - 1 VIEW  COMPARISON:  06/25/2014  FINDINGS: Endotracheal tube removed.  Central venous catheter tip in the SVC.  Interval improvement in pulmonary edema. Improvement in bibasilar atelectasis which remains. Small right effusion has improved.  IMPRESSION: Significant improvement in pulmonary edema. Mild bibasilar atelectasis remains.  Endotracheal tube removed.   Electronically Signed   By: Franchot Gallo M.D.   On: 06/26/2014 07:26   Dg Chest Port 1 View  06/25/2014   CLINICAL DATA:  Central line placement.  EXAM: PORTABLE CHEST - 1 VIEW  COMPARISON:  06/25/2014.  FINDINGS: The endotracheal tube and NG tubes are stable. There is a new right IJ central venous catheter with its tip at the level of the carina in the region of the mid SVC. The heart and lungs are stable.  IMPRESSION: Right IJ central venous catheter in good position with its tip in the mid SVC. No complicating features such as pneumothorax.  Stable mild cardiac enlargement, pulmonary vascular congestion and possible mild edema.   Electronically Signed   By: Kalman Jewels M.D.   On: 06/25/2014 12:27   Dg Chest Port 1 View  06/25/2014   CLINICAL DATA:  Evaluate endotracheal tube, airspace disease  EXAM: PORTABLE CHEST - 1 VIEW  COMPARISON:  06/24/2014; 11/29/2012; 07/09/2011  FINDINGS: Grossly unchanged enlarged cardiac silhouette and mediastinal contours given persistently reduced lung volumes and patient rotation. Atherosclerotic plaque within the thoracic aorta. Stable position of support apparatus. No pneumothorax. Worsening bilateral mid and lower lung heterogeneous opacities, right greater than left. Pulmonary vasculature remains indistinct with cephalization of flow. Trace bilateral effusions are not excluded,  right greater than left. Grossly unchanged bones.  IMPRESSION: 1.  Stable positioning of support apparatus.  No pneumothorax. 2. Findings most suggestive of worsening pulmonary edema and bibasilar opacities, atelectasis versus infiltrate.   Electronically Signed   By: Sandi Mariscal M.D.   On: 06/25/2014 07:55   Dg Chest Portable 1 View  06/24/2014   CLINICAL DATA:  SHORTNESS OF BREATH  EXAM: PORTABLE CHEST - 1 VIEW  COMPARISON:  11/29/2012  FINDINGS: Endotracheal tube has been placed, tip approximately 5.8 cm above carina. Atheromatous aorta. Heart size upper limits normal. Moderate interstitial and airspace opacities in a predominantly perihilar and infrahilar distribution, increased since previous exam, right greater than left. No effusion. Visualized skeletal structures are unremarkable.  IMPRESSION: 1. Endotracheal tube tip 5.8 cm above carina. 2. Interval increase in asymmetric bilateral edema/infiltrates.   Electronically Signed   By: Arne Cleveland M.D.   On: 06/24/2014 11:38   Dg Abd Portable 1v  06/24/2014   CLINICAL DATA:  OG tube placement.  EXAM: PORTABLE ABDOMEN - 1 VIEW  COMPARISON:  None.  FINDINGS: The OG tube tip is in the distal stomach. No abnormal dilated loops of bowel. No suspicious radiopaque foreign bodies  are soft tissue calcifications. Calcified atherosclerotic disease involves the abdominal aorta and its branches.  IMPRESSION: 1. OG tube tip is in satisfactory position within the distal stomach.   Electronically Signed   By: Kerby Moors M.D.   On: 06/24/2014 20:59    Medications: Scheduled Meds: . aspirin  325 mg Oral Daily  . atorvastatin  20 mg Oral QHS  . carvedilol  12.5 mg Oral BID WC  . cilostazol  50 mg Oral BID  . diltiazem  120 mg Oral Daily  . divalproex  500 mg Oral BID  . doxycycline  100 mg Oral Q12H  . finasteride  5 mg Oral Daily  . furosemide  40 mg Oral TID  . gabapentin  300 mg Oral TID  . heparin  5,000 Units Subcutaneous 3 times per day  .  insulin aspart  0-15 Units Subcutaneous TID WC  . insulin aspart  0-5 Units Subcutaneous QHS  . insulin aspart  4 Units Subcutaneous TID WC  . insulin glargine  45 Units Subcutaneous Daily  . ipratropium  0.5 mg Nebulization BID  . levalbuterol  0.63 mg Nebulization BID  . lisinopril  40 mg Oral Daily  . nicotine  21 mg Transdermal Daily  . predniSONE  20 mg Oral Daily  . sodium chloride  3 mL Intravenous Q12H  . spironolactone  25 mg Oral Daily  . tamsulosin  0.4 mg Oral Daily  . Travoprost (BAK Free)  1 drop Both Eyes QHS      LOS: 3 days   Syed Zukas DO. [facility] 07/19/2014, 8:43 AM Pager: [phone]  If 7PM-7AM, please contact night-coverage [email or web address] Password [email or web address]  **Disclaimer: This note was dictated with voice recognition software. Similar sounding words can inadvertently be transcribed and this note may contain transcription errors which may not have been corrected upon publication of note.**

## 2014-07-19 NOTE — Progress Notes (Signed)
Patient: [name] / Admit Date: 07/16/2014 / Date of Encounter: 07/19/2014, 8:18 AM   Subjective: Feeling better but not quite there yet. Still c/o testicular swelling and LEE but it's improving. Reports ++ UOP yesterday. Does not appear to be pleased with interruption to breakfast with rounds.   Objective: Telemetry: atrial fib rates 80s-low 100s. 11 beats wider complex - suspect abberrancy given irregularity Physical Exam: Blood pressure 147/80, pulse 99, temperature 97.4 F (36.3 C), temperature source Oral, resp. rate 20, height 6\' 2"  (1.88 m), weight 224 lb 4.8 oz (101.742 kg), SpO2 97.00%. General: Well developed, well nourished AAMM, in no acute distress.  Head: Normocephalic, atraumatic, sclera non-icteric, no xanthomas, nares are without discharge.  Neck: JVP not elevated.  Lungs: Diminished air movement throughout without wheezes or rales. Breathing is unlabored.  Heart: Irregularly irregular, controlled rate, S1 S2 without murmurs, rubs, or gallops.  Abdomen: Soft, non-tender, non-distended with normoactive bowel sounds. No rebound/guarding.  Extremities: No clubbing or cyanosis. 1+ BLE edema. Distal pedal pulses are 2+ and equal bilaterally.  Neuro: Alert and oriented X 3. Moves all extremities spontaneously.  Psych: Responds to questions appropriately with a normal affect.   Intake/Output Summary (Last 24 hours) at 07/19/14 0818 Last data filed at 07/19/14 0448  Gross per 24 hour  Intake    750 ml  Output   3650 ml  Net  -2900 ml    Inpatient Medications:  . aspirin  325 mg Oral Daily  . atorvastatin  20 mg Oral QHS  . carvedilol  12.5 mg Oral BID WC  . cilostazol  50 mg Oral BID  . diltiazem  120 mg Oral Daily  . divalproex  500 mg Oral BID  . doxycycline  100 mg Oral Q12H  . finasteride  5 mg Oral Daily  . furosemide  40 mg Oral BID  . gabapentin  300 mg Oral TID  . heparin  5,000 Units Subcutaneous 3 times per day  . insulin aspart  0-15 Units  Subcutaneous TID WC  . insulin aspart  0-5 Units Subcutaneous QHS  . insulin aspart  4 Units Subcutaneous TID WC  . insulin glargine  45 Units Subcutaneous Daily  . ipratropium  0.5 mg Nebulization BID  . levalbuterol  0.63 mg Nebulization BID  . lisinopril  40 mg Oral Daily  . nicotine  21 mg Transdermal Daily  . predniSONE  20 mg Oral Daily  . sodium chloride  3 mL Intravenous Q12H  . spironolactone  25 mg Oral Daily  . tamsulosin  0.4 mg Oral Daily  . Travoprost (BAK Free)  1 drop Both Eyes QHS   Infusions:    Labs:  Recent Labs  07/17/14 0508 07/19/14 0324  NA 135* 141  K 4.1 5.1  CL 94* 98  CO2 27 33*  GLUCOSE 206* 208*  BUN 43* 38*  CREATININE 2.24* 1.55*  CALCIUM 8.6 9.5   No results found for this basename: AST, ALT, ALKPHOS, BILITOT, PROT, ALBUMIN,  in the last 72 hours  Recent Labs  07/16/14 1305 07/17/14 0508  WBC 10.6* 11.0*  HGB 10.9* 11.0*  HCT 33.2* 33.8*  MCV 89.7 89.7  PLT 179 185    Recent Labs  07/16/14 1806 07/16/14 2253 07/17/14 0508  TROPONINI <0.30 <0.30 <0.30   No components found with this basename: POCBNP,  No results found for this basename: HGBA1C,  in the last 72 hours   Radiology/Studies:  Dg Chest 2 View  07/16/2014  CLINICAL DATA:  Shortness of breath.  EXAM: CHEST  2 VIEW  COMPARISON:  Single view of the chest 06/27/2014 and 06/25/2014.  FINDINGS: Tiny bilateral pleural effusions are seen. There is cardiomegaly and mild interstitial edema. No pneumothorax or consolidative process is identified.  IMPRESSION: Cardiomegaly and mild interstitial edema.   Electronically Signed   By: Inge Rise M.D.   On: 07/16/2014 13:35   Dg Chest Port 1 View  06/27/2014   CLINICAL DATA:  Pulmonary edema.  EXAM: PORTABLE CHEST - 1 VIEW  COMPARISON:  Multiple recent previous exams.  FINDINGS: The diffuse interstitial and basilar alveolar opacity persists and is slightly improved in the interval. The cardio pericardial silhouette is enlarged.  Right IJ central line has been removed. Telemetry leads overlie the chest.  IMPRESSION: Interval improvement in pulmonary edema pattern with removal of the right IJ central line.   Electronically Signed   By: Misty Stanley M.D.   On: 06/27/2014 07:39   Dg Chest Port 1 View  06/26/2014   CLINICAL DATA:  Followup edema  EXAM: PORTABLE CHEST - 1 VIEW  COMPARISON:  06/25/2014  FINDINGS: Endotracheal tube removed.  Central venous catheter tip in the SVC.  Interval improvement in pulmonary edema. Improvement in bibasilar atelectasis which remains. Small right effusion has improved.  IMPRESSION: Significant improvement in pulmonary edema. Mild bibasilar atelectasis remains.  Endotracheal tube removed.   Electronically Signed   By: Franchot Gallo M.D.   On: 06/26/2014 07:26   Dg Chest Port 1 View  06/25/2014   CLINICAL DATA:  Central line placement.  EXAM: PORTABLE CHEST - 1 VIEW  COMPARISON:  06/25/2014.  FINDINGS: The endotracheal tube and NG tubes are stable. There is a new right IJ central venous catheter with its tip at the level of the carina in the region of the mid SVC. The heart and lungs are stable.  IMPRESSION: Right IJ central venous catheter in good position with its tip in the mid SVC. No complicating features such as pneumothorax.  Stable mild cardiac enlargement, pulmonary vascular congestion and possible mild edema.   Electronically Signed   By: Kalman Jewels M.D.   On: 06/25/2014 12:27   Dg Chest Port 1 View  06/25/2014   CLINICAL DATA:  Evaluate endotracheal tube, airspace disease  EXAM: PORTABLE CHEST - 1 VIEW  COMPARISON:  06/24/2014; 11/29/2012; 07/09/2011  FINDINGS: Grossly unchanged enlarged cardiac silhouette and mediastinal contours given persistently reduced lung volumes and patient rotation. Atherosclerotic plaque within the thoracic aorta. Stable position of support apparatus. No pneumothorax. Worsening bilateral mid and lower lung heterogeneous opacities, right greater than left.  Pulmonary vasculature remains indistinct with cephalization of flow. Trace bilateral effusions are not excluded, right greater than left. Grossly unchanged bones.  IMPRESSION: 1.  Stable positioning of support apparatus.  No pneumothorax. 2. Findings most suggestive of worsening pulmonary edema and bibasilar opacities, atelectasis versus infiltrate.   Electronically Signed   By: Sandi Mariscal M.D.   On: 06/25/2014 07:55   Dg Chest Portable 1 View  06/24/2014   CLINICAL DATA:  SHORTNESS OF BREATH  EXAM: PORTABLE CHEST - 1 VIEW  COMPARISON:  11/29/2012  FINDINGS: Endotracheal tube has been placed, tip approximately 5.8 cm above carina. Atheromatous aorta. Heart size upper limits normal. Moderate interstitial and airspace opacities in a predominantly perihilar and infrahilar distribution, increased since previous exam, right greater than left. No effusion. Visualized skeletal structures are unremarkable.  IMPRESSION: 1. Endotracheal tube tip 5.8 cm above carina. 2. Interval  increase in asymmetric bilateral edema/infiltrates.   Electronically Signed   By: Arne Cleveland M.D.   On: 06/24/2014 11:38   Dg Abd Portable 1v  06/24/2014   CLINICAL DATA:  OG tube placement.  EXAM: PORTABLE ABDOMEN - 1 VIEW  COMPARISON:  None.  FINDINGS: The OG tube tip is in the distal stomach. No abnormal dilated loops of bowel. No suspicious radiopaque foreign bodies are soft tissue calcifications. Calcified atherosclerotic disease involves the abdominal aorta and its branches.  IMPRESSION: 1. OG tube tip is in satisfactory position within the distal stomach.   Electronically Signed   By: Kerby Moors M.D.   On: 06/24/2014 20:59     Assessment and Plan  1. Acute on chronic respiratory insufficiency likely multifactorial from severe COPD/HF  2. Acute on chronic diastolic HF with some component of chronic LEE  - recent echo 6/15: EF 56-97%, grade 3 diastolic dysfunction, mod concentric LVH  3. Elevated POC troponin in the setting  of AKI and CHF - f/u levels negative - unlikely to be ACS  4. AKI on CKD stage III, baseline appears 1.6-1.8  5. Tobacco abuse and alcoholism, counseled  6. Hypertension  7. CAD s/p PCI 2011 8. Persistent a-fib; not a candidate for anticoagulation with longstanding issues with noncompliance, imbalance and h/o alcoholism, plans to readdress candidacy for anticoag as outpatient if he demonstrates compliance   Cr now near baseline. K is borderline - will discuss decreasing spiro vs close f/u with MD. We had talked about changing Coreg to metoprolol - will clarify with MD that this should be done. Continue current regimen through today. He doesn't feel quite ready to go home yet - anticipate DC tomorrow then. It's likely that some component of volume overload due to oral prednisone for his recent COPD exacerbation, combined with high salt diet and drinking lots of water.   Signed, Melina Copa PA-C Patient seen and examined and history reviewed. Agree with above findings and plan. Patient in good spirits today. States his breathing is better. Good diuresis and weight down 5 lbs since admit. Creatinine improving. Patient does not have active wheezing and AFib rate controlled so I would not switch Coreg to metoprolol at this point. Still has 2+ edema But anticipate this will improve with further diuresis. On oral lasix now.  Peter Martinique, [place] 07/19/2014 12:07 PM

## 2014-07-20 DIAGNOSIS — Z9119 Patient's noncompliance with other medical treatment and regimen: Secondary | ICD-10-CM

## 2014-07-20 DIAGNOSIS — I5033 Acute on chronic diastolic (congestive) heart failure: Secondary | ICD-10-CM | POA: Diagnosis not present

## 2014-07-20 DIAGNOSIS — N058 Unspecified nephritic syndrome with other morphologic changes: Secondary | ICD-10-CM

## 2014-07-20 DIAGNOSIS — Z91199 Patient's noncompliance with other medical treatment and regimen due to unspecified reason: Secondary | ICD-10-CM

## 2014-07-20 LAB — BASIC METABOLIC PANEL
Anion gap: 11 (ref 5–15)
BUN: 38 mg/dL — ABNORMAL HIGH (ref 6–23)
CO2: 35 mEq/L — ABNORMAL HIGH (ref 19–32)
Calcium: 9.6 mg/dL (ref 8.4–10.5)
Chloride: 96 mEq/L (ref 96–112)
Creatinine, Ser: 1.55 mg/dL — ABNORMAL HIGH (ref 0.50–1.35)
GFR, EST AFRICAN AMERICAN: 49 mL/min — AB (ref >90)
GFR, EST NON AFRICAN AMERICAN: 42 mL/min — AB (ref >90)
Glucose, Bld: 208 mg/dL — ABNORMAL HIGH (ref 70–99)
POTASSIUM: 4.8 meq/L (ref 3.7–5.3)
Sodium: 142 mEq/L (ref 137–147)

## 2014-07-20 LAB — GLUCOSE, CAPILLARY
GLUCOSE-CAPILLARY: 64 mg/dL — AB (ref 70–99)
GLUCOSE-CAPILLARY: 204 mg/dL — AB (ref 70–99)
Glucose-Capillary: 169 mg/dL — ABNORMAL HIGH (ref 70–99)
Glucose-Capillary: 160 mg/dL — ABNORMAL HIGH (ref 70–99)
Glucose-Capillary: 60 mg/dL — ABNORMAL LOW (ref 70–99)

## 2014-07-20 LAB — CBC
HCT: 38.7 % — ABNORMAL LOW (ref 39.0–52.0)
HEMOGLOBIN: 12.7 g/dL — AB (ref 13.0–17.0)
MCH: 28.9 pg (ref 26.0–34.0)
MCHC: 32.8 g/dL (ref 30.0–36.0)
MCV: 88.2 fL (ref 78.0–100.0)
Platelets: 322 10*3/uL (ref 150–400)
RBC: 4.39 MIL/uL (ref 4.22–5.81)
RDW: 14.1 % (ref 11.5–15.5)
WBC: 7.4 10*3/uL (ref 4.0–10.5)

## 2014-07-20 MED ORDER — INSULIN GLARGINE 100 UNIT/ML ~~LOC~~ SOLN: 50.0000 [IU] | Freq: Every day | SUBCUTANEOUS | Status: DC

## 2014-07-20 NOTE — Discharge Summary (Signed)
Physician Discharge Summary  [name]:[id number] DOB: [birth year] DOA: 07/16/2014  PCP: Jilda Panda, MD  Admit date: 07/16/2014 Discharge date: 07/20/2014  Time spent: 35 minutes  Recommendations for Outpatient Follow-up:  1. Needs close outpatient follow-up and education 2. Home health  Discharge Diagnoses:  Principal Problem:   Acute respiratory failure Active Problems:   DM (diabetes mellitus), type 2 with renal complications   HYPERLIPIDEMIA   HYPERTENSION   CAD   PVD   Atrial fibrillation   Diastolic CHF, chronic   Acute respiratory failure with hypoxemia   Acute on chronic diastolic congestive heart failure   CKD (chronic kidney disease), stage III   Discharge Condition: improved  Diet recommendation: cardiac  Filed Weights   07/18/14 0315 07/19/14 0441 07/20/14 0618  Weight: 102.83 kg (226 lb 11.2 oz) 101.742 kg (224 lb 4.8 oz) 100.29 kg (221 lb 1.6 oz)    History of present illness:  [name] is a 78 y.o. male with PMH of diastolic CHF, presumed COPD, HTN, A-fib and DM.  He present with a complaint of shortness of breath which started 3 days and is only occurs when he is talking. He has not been walking much. Ankles have become swollen again. He states he has been eating salt and states he was told by his PCP to drink lots of water for his UTI. He was started on Nitrofurantion by his PCP for a UTI recently. I have spoken with his [place] who [facility] with [facility] [phone]) . She states she found him very short of breath today. She is not sure if he has been gaining weight as she feels his scale has been inaccurate. His Cr has increased from when he was last admitted. He also has a h/o severe COPD and was asked to have PFTs performed as outpt- he is on Prednisone 20 mg daily which is what he was discharged with.    Hospital Course:  Acute on chronic respiratory insufficiency: likely multifactorial from severe COPD/HF    Acute on chronic diastolic HF: with some component of chronic LEE. Possible exacerbation in the setting of prednisone for severe COPD. Pro BNP 6079  - recent echo 6/15: EF 61-60%, grade 3 diastolic dysfunction, mod concentric LVH  - on Lasix 40mg  bid and spironolactone 25mg  qd. Net neg 5.59L Weight down 8lbs. Still with significant LE edema and likely has more diuresis to go  -- fluid restriction: under 2L of fluid a day  Persistent a-fib: well controlled. Not a candidate for anticoagulation with longstanding issues with noncompliance, imbalance and h/o alcoholism  - Continue on carvedilol 12.5mg  BID and dilt LA 120mg  qd for rate control   Elevated POC troponin in the setting of AKI and CHF -unlikely to be ACS  - POC troponin 0.16 on admission. Troponins now negative.   AKI on CKD stage III: baseline appears 1.6-1.8  - SCr today 1.55 down from 2.74, which is at his baseline.   Tobacco abuse and alcoholism: counseled   Hypertension: doing well on carvedilol, so will just keep there.   CAD s/p PCI 2011   NSVT- Two runs run of NSVT on telemetry noted. He is on a BB currently.     Procedures:    Consultations:  cardiology  Discharge Exam: Filed Vitals:   07/20/14 1114  BP: 147/87  Pulse: 94  Temp:   Resp: 18    General: A+Ox3, NAd, + LE edema Cardiovascular: irrr Respiratory: clear  Discharge  Instructions You were cared for by a hospitalist during your hospital stay. If you have any questions about your discharge medications or the care you received while you were in the hospital after you are discharged, you can call the unit and asked to speak with the hospitalist on call if the hospitalist that took care of you is not available. Once you are discharged, your primary care physician will handle any further medical issues. Please note that NO REFILLS for any discharge medications will be authorized once you are discharged, as it is imperative that you return to your  primary care physician (or establish a relationship with a primary care physician if you do not have one) for your aftercare needs so that they can reassess your need for medications and monitor your lab values.  Discharge Instructions   (HEART FAILURE PATIENTS) Call MD:  Anytime you have any of the following symptoms: 1) 3 pound weight gain in 24 hours or 5 pounds in 1 week 2) shortness of breath, with or without a dry hacking cough 3) swelling in the hands, feet or stomach 4) if you have to sleep on extra pillows at night in order to breathe.    Complete by:  As directed      Diet - low sodium heart healthy    Complete by:  As directed      Diet Carb Modified    Complete by:  As directed      Discharge instructions    Complete by:  As directed   Home health Fluid restrict to less than 2L/day Cbc, bmp 1 week Elevate feet to reduce swelling     Increase activity slowly    Complete by:  As directed             Medication List    STOP taking these medications       cyanocobalamin 1000 MCG tablet     DAILY MULTIPLE VITAMINS PO     nitrofurantoin 100 MG capsule  Commonly known as:  MACRODANTIN     sildenafil 100 MG tablet  Commonly known as:  VIAGRA      TAKE these medications       albuterol 108 (90 BASE) MCG/ACT inhaler  Commonly known as:  PROVENTIL HFA;VENTOLIN HFA  Inhale 2 puffs into the lungs every 6 (six) hours as needed for shortness of breath.     aspirin 325 MG EC tablet  Take 325 mg by mouth daily.     atorvastatin 40 MG tablet  Commonly known as:  LIPITOR  Take 20 mg by mouth at bedtime.     carvedilol 12.5 MG tablet  Commonly known as:  COREG  Take 12.5 mg by mouth 2 (two) times daily with a meal.     cilostazol 50 MG tablet  Commonly known as:  PLETAL  Take 50 mg by mouth 2 (two) times daily.     diltiazem 120 MG 24 hr tablet  Commonly known as:  CARDIZEM LA  Take 120 mg by mouth daily.     divalproex 250 MG DR tablet  Commonly known as:   DEPAKOTE  Take 500 mg by mouth 2 (two) times daily.     finasteride 5 MG tablet  Commonly known as:  PROSCAR  Take 5 mg by mouth daily.     furosemide 40 MG tablet  Commonly known as:  LASIX  Take 40 mg by mouth daily.     gabapentin 300 MG capsule  Commonly known  as:  NEURONTIN  Take 300 mg by mouth 3 (three) times daily.     insulin glargine 100 UNIT/ML injection  Commonly known as:  LANTUS  Inject 0.5 mLs (50 Units total) into the skin daily.     insulin regular 100 units/mL injection  Commonly known as:  NOVOLIN R,HUMULIN R  Inject 8 Units into the skin 2 (two) times daily before a meal.     lisinopril 40 MG tablet  Commonly known as:  PRINIVIL,ZESTRIL  Take 40 mg by mouth daily.     nicotine 14 mg/24hr patch  Commonly known as:  NICODERM CQ - dosed in mg/24 hours  Place 1 patch (14 mg total) onto the skin daily.     predniSONE 20 MG tablet  Commonly known as:  DELTASONE  Take 20 mg by mouth daily.     rosuvastatin 20 MG tablet  Commonly known as:  CRESTOR  Take 20 mg by mouth daily.     spironolactone 25 MG tablet  Commonly known as:  ALDACTONE  Take 25 mg by mouth daily.     tamsulosin 0.4 MG Caps capsule  Commonly known as:  FLOMAX  Take 0.4 mg by mouth daily.     travoprost (benzalkonium) 0.004 % ophthalmic solution  Commonly known as:  TRAVATAN  Place 1 drop into both eyes at bedtime.       Allergies  Allergen Reactions  . Penicillins Other (See Comments)     hands and feet edema       Follow-up Information   Follow up with Jilda Panda, MD On 07/27/2014. (@2 :30 pm spoke with Terri )    Specialty:  Internal Medicine   Contact information:   [address] [place] [address] [phone]       Follow up with Erlene Quan, PA-C On 07/24/2014. (@10  am spoke [place] )    Specialty:  Cardiology   Contact information:   [address] [place] [address] [phone]        The results of significant diagnostics from  this hospitalization (including imaging, microbiology, ancillary and laboratory) are listed below for reference.    Significant Diagnostic Studies: Dg Chest 2 View  07/16/2014   CLINICAL DATA:  Shortness of breath.  EXAM: CHEST  2 VIEW  COMPARISON:  Single view of the chest 06/27/2014 and 06/25/2014.  FINDINGS: Tiny bilateral pleural effusions are seen. There is cardiomegaly and mild interstitial edema. No pneumothorax or consolidative process is identified.  IMPRESSION: Cardiomegaly and mild interstitial edema.   Electronically Signed   By: Inge Rise M.D.   On: 07/16/2014 13:35   Dg Chest Port 1 View  06/27/2014   CLINICAL DATA:  Pulmonary edema.  EXAM: PORTABLE CHEST - 1 VIEW  COMPARISON:  Multiple recent previous exams.  FINDINGS: The diffuse interstitial and basilar alveolar opacity persists and is slightly improved in the interval. The cardio pericardial silhouette is enlarged. Right IJ central line has been removed. Telemetry leads overlie the chest.  IMPRESSION: Interval improvement in pulmonary edema pattern with removal of the right IJ central line.   Electronically Signed   By: Misty Stanley M.D.   On: 06/27/2014 07:39   Dg Chest Port 1 View  06/26/2014   CLINICAL DATA:  Followup edema  EXAM: PORTABLE CHEST - 1 VIEW  COMPARISON:  06/25/2014  FINDINGS: Endotracheal tube removed.  Central venous catheter tip in the SVC.  Interval improvement in pulmonary edema. Improvement in bibasilar atelectasis which remains. Small right effusion  has improved.  IMPRESSION: Significant improvement in pulmonary edema. Mild bibasilar atelectasis remains.  Endotracheal tube removed.   Electronically Signed   By: Franchot Gallo M.D.   On: 06/26/2014 07:26   Dg Chest Port 1 View  06/25/2014   CLINICAL DATA:  Central line placement.  EXAM: PORTABLE CHEST - 1 VIEW  COMPARISON:  06/25/2014.  FINDINGS: The endotracheal tube and NG tubes are stable. There is a new right IJ central venous catheter with its tip at  the level of the carina in the region of the mid SVC. The heart and lungs are stable.  IMPRESSION: Right IJ central venous catheter in good position with its tip in the mid SVC. No complicating features such as pneumothorax.  Stable mild cardiac enlargement, pulmonary vascular congestion and possible mild edema.   Electronically Signed   By: Kalman Jewels M.D.   On: 06/25/2014 12:27   Dg Chest Port 1 View  06/25/2014   CLINICAL DATA:  Evaluate endotracheal tube, airspace disease  EXAM: PORTABLE CHEST - 1 VIEW  COMPARISON:  06/24/2014; 11/29/2012; 07/09/2011  FINDINGS: Grossly unchanged enlarged cardiac silhouette and mediastinal contours given persistently reduced lung volumes and patient rotation. Atherosclerotic plaque within the thoracic aorta. Stable position of support apparatus. No pneumothorax. Worsening bilateral mid and lower lung heterogeneous opacities, right greater than left. Pulmonary vasculature remains indistinct with cephalization of flow. Trace bilateral effusions are not excluded, right greater than left. Grossly unchanged bones.  IMPRESSION: 1.  Stable positioning of support apparatus.  No pneumothorax. 2. Findings most suggestive of worsening pulmonary edema and bibasilar opacities, atelectasis versus infiltrate.   Electronically Signed   By: Sandi Mariscal M.D.   On: 06/25/2014 07:55   Dg Chest Portable 1 View  06/24/2014   CLINICAL DATA:  SHORTNESS OF BREATH  EXAM: PORTABLE CHEST - 1 VIEW  COMPARISON:  11/29/2012  FINDINGS: Endotracheal tube has been placed, tip approximately 5.8 cm above carina. Atheromatous aorta. Heart size upper limits normal. Moderate interstitial and airspace opacities in a predominantly perihilar and infrahilar distribution, increased since previous exam, right greater than left. No effusion. Visualized skeletal structures are unremarkable.  IMPRESSION: 1. Endotracheal tube tip 5.8 cm above carina. 2. Interval increase in asymmetric bilateral edema/infiltrates.    Electronically Signed   By: Arne Cleveland M.D.   On: 06/24/2014 11:38   Dg Abd Portable 1v  06/24/2014   CLINICAL DATA:  OG tube placement.  EXAM: PORTABLE ABDOMEN - 1 VIEW  COMPARISON:  None.  FINDINGS: The OG tube tip is in the distal stomach. No abnormal dilated loops of bowel. No suspicious radiopaque foreign bodies are soft tissue calcifications. Calcified atherosclerotic disease involves the abdominal aorta and its branches.  IMPRESSION: 1. OG tube tip is in satisfactory position within the distal stomach.   Electronically Signed   By: Kerby Moors M.D.   On: 06/24/2014 20:59    Microbiology: No results found for this or any previous visit (from the past 240 hour(s)).   Labs: Basic Metabolic Panel:  Recent Labs Lab 07/16/14 1305 07/17/14 0508 07/19/14 0324 07/20/14 0332  NA 137 135* 141 142  K 3.6* 4.1 5.1 4.8  CL 94* 94* 98 96  CO2 30 27 33* 35*  GLUCOSE 221* 206* 208* 208*  BUN 43* 43* 38* 38*  CREATININE 2.74* 2.24* 1.55* 1.55*  CALCIUM 8.5 8.6 9.5 9.6   Liver Function Tests: No results found for this basename: AST, ALT, ALKPHOS, BILITOT, PROT, ALBUMIN,  in the last 168  hours No results found for this basename: LIPASE, AMYLASE,  in the last 168 hours No results found for this basename: AMMONIA,  in the last 168 hours CBC:  Recent Labs Lab 07/16/14 1305 07/17/14 0508 07/20/14 0322  WBC 10.6* 11.0* 7.4  HGB 10.9* 11.0* 12.7*  HCT 33.2* 33.8* 38.7*  MCV 89.7 89.7 88.2  PLT 179 185 322   Cardiac Enzymes:  Recent Labs Lab 07/16/14 1806 07/16/14 2253 07/17/14 0508  TROPONINI <0.30 <0.30 <0.30   BNP: BNP (last 3 results)  Recent Labs  06/28/14 0400 07/03/14 1514 07/16/14 1305  PROBNP 2134.0* 298.0* 6079.0*   CBG:  Recent Labs Lab 07/19/14 1140 07/19/14 1619 07/19/14 2134 07/20/14 0614 07/20/14 1131  GLUCAP 113* 147* 190* 169* 160*       Signed:  Delrick Dehart  [facility] 07/20/2014, 12:49 PM

## 2014-07-20 NOTE — Progress Notes (Signed)
PT Cancellation Note  Patient Details Name: [name] MRN: [id number] DOB: 12-30-1935   Cancelled Treatment:    Reason Eval/Treat Not Completed: Pt states he is not ready to go home at this time, and is upset that he has been discharged. Encouraged pt to work with PT to ensure that he does not have any further needs at this time, however pt refusing.   Jolyn Lent 07/20/2014, 2:25 PM  Jolyn Lent, PT, DPT Acute Rehabilitation Services Pager: [phone]

## 2014-07-20 NOTE — Progress Notes (Signed)
Pt decided to d/c home.  ABN placed on chart unsigned by pt.

## 2014-07-20 NOTE — Progress Notes (Signed)
Repeat cbg 204

## 2014-07-20 NOTE — Progress Notes (Signed)
Patient Name: [name] Date of Encounter: 07/20/2014     Principal Problem:   Acute respiratory failure Active Problems:   DM (diabetes mellitus), type 2 with renal complications   HYPERLIPIDEMIA   HYPERTENSION   CAD   PVD   Atrial fibrillation   Diastolic CHF, chronic   Acute respiratory failure with hypoxemia   Acute on chronic diastolic congestive heart failure   CKD (chronic kidney disease), stage III    SUBJECTIVE  Feeling better. Wanting to know more about fluid restriction. Had some confusion. He had some pain in his left heel last night. No ulcer. Patient has not been elevating legs and still with significant LE edema. No CP or SOB.  CURRENT MEDS . aspirin  325 mg Oral Daily  . atorvastatin  20 mg Oral QHS  . carvedilol  12.5 mg Oral BID WC  . cilostazol  50 mg Oral BID  . diltiazem  120 mg Oral Daily  . divalproex  500 mg Oral BID  . doxycycline  100 mg Oral Q12H  . finasteride  5 mg Oral Daily  . furosemide  40 mg Oral BID  . gabapentin  300 mg Oral TID  . heparin  5,000 Units Subcutaneous 3 times per day  . insulin aspart  0-15 Units Subcutaneous TID WC  . insulin aspart  0-5 Units Subcutaneous QHS  . insulin aspart  4 Units Subcutaneous TID WC  . insulin glargine  50 Units Subcutaneous Daily  . ipratropium  0.5 mg Nebulization BID  . levalbuterol  0.63 mg Nebulization BID  . lisinopril  40 mg Oral Daily  . nicotine  21 mg Transdermal Daily  . predniSONE  20 mg Oral Daily  . sodium chloride  3 mL Intravenous Q12H  . spironolactone  25 mg Oral Daily  . tamsulosin  0.4 mg Oral Daily  . Travoprost (BAK Free)  1 drop Both Eyes QHS    OBJECTIVE  Filed Vitals:   07/19/14 1348 07/19/14 2031 07/19/14 2135 07/20/14 0618  BP: 136/75  126/60 157/78  Pulse: 106 93 90 92  Temp: 97.9 F (36.6 C)  98.1 F (36.7 C) 97.8 F (36.6 C)  TempSrc: Oral  Oral Oral  Resp: 20 18 18 18   Height:      Weight:    221 lb 1.6 oz (100.29 kg)  SpO2: 93% 94% 94%  98%    Intake/Output Summary (Last 24 hours) at 07/20/14 0981 Last data filed at 07/20/14 0600  Gross per 24 hour  Intake   1680 ml  Output   3650 ml  Net  -1970 ml   Filed Weights   07/18/14 0315 07/19/14 0441 07/20/14 0618  Weight: 226 lb 11.2 oz (102.83 kg) 224 lb 4.8 oz (101.742 kg) 221 lb 1.6 oz (100.29 kg)    PHYSICAL EXAM  General: Well developed, well nourished AAMM, in no acute distress.  Head: Normocephalic, atraumatic, sclera non-icteric, no xanthomas, nares are without discharge.  Neck: JVP not elevated.  Lungs: Diminished air movement throughout without wheezes or rales. Breathing is unlabored.  Heart: Irregularly irregular, controlled rate, S1 S2 without murmurs, rubs, or gallops.  Abdomen: Soft, non-tender, non-distended with normoactive bowel sounds. No rebound/guarding.  Extremities: No clubbing or cyanosis. 1+ BLE edema. Distal pedal pulses are 2+ and equal bilaterally.  Neuro: Alert and oriented X 3. Moves all extremities spontaneously.  Psych: Responds to questions appropriately with a normal affect.   Accessory Clinical Findings  CBC  Recent Labs  07/20/14 0322  WBC 7.4  HGB 12.7*  HCT 38.7*  MCV 88.2  PLT 621   Basic Metabolic Panel  Recent Labs  07/19/14 0324 07/20/14 0332  NA 141 142  K 5.1 4.8  CL 98 96  CO2 33* 35*  GLUCOSE 208* 208*  BUN 38* 38*  CREATININE 1.55* 1.55*  CALCIUM 9.5 9.6   TELE  Rate controlled a-fib  W/ 1 4 beat run of NSVT  Radiology/Studies  Dg Chest 2 View  07/16/2014   CLINICAL DATA:  Shortness of breath.  EXAM: CHEST  2 VIEW  COMPARISON:  Single view of the chest 06/27/2014 and 06/25/2014.  FINDINGS: Tiny bilateral pleural effusions are seen. There is cardiomegaly and mild interstitial edema. No pneumothorax or consolidative process is identified.  IMPRESSION: Cardiomegaly and mild interstitial edema.   Electronically Signed   By: Inge Rise M.D.   On: 07/16/2014 13:35    ASSESSMENT AND PLAN  78  yo male with PMH significant for tobacco abuse, alcoholism, a-fib, HTN, CAD (NSETEMI 2011 with pci, EF 50-55% and hypokinesis of the pericardium, stage III diastolic dysfunction), PAD, bipolar, CKD, chronic diastolic CHF who presented to [facility] on 07/16/14 on with dyspnea, O2 saturation dropping into the 80s with exertion and decreased urine output.  He was recently admitted on June 28th 2015 for hypercapnic respiratory failure and hypertensive emergency.  Acute on chronic respiratory insufficiency:  likely multifactorial from severe COPD/HF   Acute on chronic diastolic HF: with some component of chronic LEE.  Possible exacerbation in the setting of prednisone for severe COPD. Pro BNP 6079  - recent echo 6/15: EF 30-86%, grade 3 diastolic dysfunction, mod concentric LVH  - on Lasix 40mg  bid and spironolactone 25mg  qd. Net neg 5.59L Weight down 8lbs. Still with significant LE edema and likely has more diuresis to go -- I explained to him the importance of fluid and salt restriction. I advised him under 2L of fluid a day. He understands.   Persistent a-fib: well controlled. Not a candidate for anticoagulation with longstanding issues with noncompliance, imbalance and h/o alcoholism - Continue on carvedilol  12.5mg  BID and dilt LA 120mg  qd for rate control  Elevated POC troponin in the setting of AKI and CHF -unlikely to be ACS - POC troponin 0.16 on admission. Troponins now negative.  AKI on CKD stage III:  baseline appears 1.6-1.8  - SCr today 1.55 down from 2.74, which is at his baseline.   Tobacco abuse and alcoholism: counseled   Hypertension: Recommended to switch coreg to metoprolol in the setting of severe COPD. He is doing well on carvedilol, so will just keep there.   CAD s/p PCI 2011  NSVT- Two runs run of NSVT on telemetry noted. He is on a BB currently. Continue to monitor   Signed,  Perry Mount PA-C Patient seen and examined and history reviewed. Agree with above findings and  plan. Doing better. Clarified fluid restriction. Ankles and feet are still edematous but are significantly improved. Diuresing well on oral diuretics. He is concerned about going home too soon and having to come back in 2 weeks. Recommend daily weights. When he goes home will need close follow up in our office with Dr. Burt Knack.  Sema Stangler [place], [place] 07/20/2014 10:16 AM    Pager [phone]

## 2014-07-20 NOTE — Progress Notes (Signed)
Discharge instructions given to pt and daughter . Dc  wheeleds to lobby by NT

## 2014-07-20 NOTE — Progress Notes (Signed)
Issued an Nurse, children's (ABN), also known as a waiver of liability to pt at bedside.  Pt wants an hour to review form.  NCM will return to room in one hour to collect form and pt decision. Thornton Dohrmann [name], [place], [place], [employer] [phone]

## 2014-07-20 NOTE — Progress Notes (Signed)
1700 cbg 60 no sx of hypogycemia crackers and orange given . Dinner served thereafter [employer] aware. Will repeat and d/c as per Dr Eliseo Squires

## 2014-07-24 ENCOUNTER — Ambulatory Visit: Payer: PRIVATE HEALTH INSURANCE | Admitting: Cardiology

## 2014-07-25 ENCOUNTER — Encounter (HOSPITAL_COMMUNITY): Payer: PRIVATE HEALTH INSURANCE

## 2014-07-28 DIAGNOSIS — I639 Cerebral infarction, unspecified: Secondary | ICD-10-CM

## 2014-07-28 HISTORY — DX: Cerebral infarction, unspecified: I63.9

## 2014-07-31 ENCOUNTER — Telehealth: Payer: Self-pay | Admitting: Cardiovascular Disease

## 2014-07-31 NOTE — Telephone Encounter (Signed)
I spoke with Amy and she called to check the pt's medication list. At her visit today the pt's BP was 82/50, apical pulse 92 (pulse ox 51, pt has AFib).  In reviewing current medication list the pt is not taking his medications as written from discharge.   The pt is currently taking: 1. Lisinopril 40mg  once a day 2. Carvedilol 12.5mg  take one by mouth twice a day 3. Furosemide 40mg  take one by mouth twice a day 4. Spironolactone 25mg  take one every evening (STARTED by PCP on 07/27/14) 5. Diltiazem LA 120mg  take one by mouth twice a day 6. Prednisone stopped  Amy states that she already removed the pt's Spironolactone from his pill box this evening.  I advised her that I had also instructed the pt to hold PM Carvedilol.   Amy plans to go back to the pt's home tomorrow morning for a BP and apical pulse check.  She will contact the office and ask for Triage to report this information.  Amy said if labs are needed prior to the pt's 08/08/14 appointment then Dr Burt Knack can order based on medication changes and she will draw blood.

## 2014-07-31 NOTE — Telephone Encounter (Signed)
New problem    Amy need to speak to nurse ASAP concerning pt's 82/50 blood pressure. Please call Amy

## 2014-07-31 NOTE — Telephone Encounter (Signed)
Left voicemail for Amy that I will try to contact the patient. I spoke with the pt and actually woke him up from a nap when I called. The pt complains of fatigue, denies dizziness, swelling, CP and SOB.  I had the pt check his BP while on the phone, 92/50 pulse 48. I advised the pt to hold his evening dose of Carvedilol. The pt had been drinking water as instructed since his discharge from the hospital. I advised the pt that we will contact him tomorrow to see what his BP and pulse are running. Pt agreed with plan.

## 2014-08-01 MED ORDER — LISINOPRIL 20 MG PO TABS: 20.0000 mg | ORAL_TABLET | Freq: Every day | ORAL | Status: DC

## 2014-08-01 MED ORDER — CARVEDILOL 6.25 MG PO TABS: 6.2500 mg | ORAL_TABLET | Freq: Two times a day (BID) | ORAL | Status: DC

## 2014-08-01 MED ORDER — ATORVASTATIN CALCIUM 40 MG PO TABS: 40.0000 mg | ORAL_TABLET | Freq: Every day | ORAL | Status: AC

## 2014-08-01 NOTE — Telephone Encounter (Signed)
Received call from Amy who is in patient's home.  Amy advised patient not to take any medications until she checked his vital signs.  With no medication this morning, patient's vital signs are: Apical pulse - 52 bpm, Lt arm BP 98/48, Rt arm BP 102/50  Amy reports that patient does not have any lower extremity swelling and denies chest pain or SOB.  Patient denies c/o dizziness - states he says he "just wants to lay down"; severe fatigue. Yesterday afternoon, patient took Diltiazem 120 mg and Lasix 40 mg.    Amy would like to know what medications Dr. Burt Knack wants patient to continue and she will prepare patient's pill box.  She also advised that she is able to obtain blood work if labs are needed.  I advised Amy that I am sending message to Dr. Burt Knack who is working in the hospital today and will call her back with orders.

## 2014-08-01 NOTE — Telephone Encounter (Signed)
Stop the diltiazem. Keep giving the diuretics at current doses (he's been hospitalized twice with CHF).

## 2014-08-01 NOTE — Telephone Encounter (Signed)
Received call from Amy who is in the home with the patient and called to give [place] update on patient's vital signs:  BP 124/50, HR 51 bpm.  Amy states when she first arrived and patient was laying down and BP was 118/50, HR 51 bpm.  She had patient sit up for awhile and recheck of vitals states above.  Amy states patient has some mild swelling in lower extremities so she is going to give patient Lasix 40 mg.  Amy states due to heart rate in the 50's she will hold pm dose of Carvedilol.  Patient refusing to take Lisinopril due to does not want BP to drop again - states he feels better this afternoon than he has felt in 2 days.  I advised Amy to recheck vital signs tomorrow and notify Theodosia Quay, RN so she can discuss with Dr. Burt Knack on how to proceed.  Amy verbalized understanding and agreement with plan of care.

## 2014-08-01 NOTE — Telephone Encounter (Signed)
Discussed with Christen Bame, RN. Plan reduce coreg and lisinopril by 50%. Continue to follow BP with Home Health. thx  Sherren Mocha 08/01/2014 12:06 PM

## 2014-08-01 NOTE — Telephone Encounter (Signed)
Spoke with Amy who states she has left patient's home but will be going back this afternoon.  Amy advised that when she left, patient's BP was 110/45.  She verbalized understanding to decrease Lisinopril to 20 mg once daily and to decrease Carvedilol to 6.25 mg twice daily.  Amy states that she will call back this afternoon and if patient's BP remains low this afternoon she will hold afternoon BP meds.  Amy would like to know if Dr. Burt Knack wants to d/c afternoon dose of Lasix 40 mg or if he wants to stop the Spironolactone and also if he wants patient to continue Diltiazem.  Amy states the patient's Diltiazem bottle says to take for esophageal spasm and she is confused by this - states it may have been prescribed by [place].

## 2014-08-01 NOTE — Telephone Encounter (Signed)
I spoke with Amy and updated her on Dr. Antionette Char advice to stop Diltiazem and continue Spironolactone and Lasix.  Patient's medication list updated.  Amy asked if lab work is needed to please let her know before Monday as she will be on vacation the remainder of next week.

## 2014-08-02 ENCOUNTER — Ambulatory Visit: Payer: PRIVATE HEALTH INSURANCE | Admitting: Cardiology

## 2014-08-02 NOTE — Telephone Encounter (Signed)
Amy called the office with an update on the pt's vital signs.  Per Amy the pt only took Furosemide 40mg  last night and held all other cardiac medications.  This morning weight was 219.8 lbs, discharge weight 218, mild swellling but improved since discharge. BP 150/70, pulse 92 irregular.  The pt feels good this morning and he is no longer unsteady when walking.  I will forward this message to Dr Burt Knack to review.

## 2014-08-02 NOTE — Telephone Encounter (Signed)
Per Dr Burt Knack the pt should remain off of Diltiazem.  He would like the pt to continue Furosemide as currently taking. Restart Lisinopril at 20mg  daily, Carvedilol 6.25mg  twice a day and Spironolactone 25mg  daily.  I spoke with Amy and made her aware of medication changes. She will continue to follow-up with the pt and her next scheduled home visit is 08/06/14.   She said our office also called the pt and rescheduled his visit to the end of August.  I made her aware that I would try and arrange follow-up next week with another extender.

## 2014-08-06 ENCOUNTER — Encounter: Payer: Self-pay | Admitting: Cardiovascular Disease

## 2014-08-06 NOTE — Telephone Encounter (Signed)
Went to DOD Dr Johnsie Cancel and he gave verbal orders to [facility] Nurse to obtain BMET and CBC W/ Diff in the field and fax the information to our office when resulted.  Informed Amy of VO Informed Amy of fax and attn too information.  Amy wanted me to send Lauren Dr Antionette Char nurse a message to please continue working on getting this pt seen in our office before 8/31.  Informed Amy that I will route this message to Lauren and Dr Burt Knack for further review and recommendation.  Amy verbalized understanding

## 2014-08-06 NOTE — Telephone Encounter (Signed)
Follow up    [facility]    Vital sign today   100/58 , pulse 100 , irregular ,   Wanted him to have an office visit before the end of the month.

## 2014-08-06 NOTE — Telephone Encounter (Addendum)
Contacted pts HHN for [facility], and Amy states she was calling to further update Dr Burt Knack and nurse about pts current health condition, and to obtain VO.  Nurse Amy states that pts current V/S were- 100/58, HR- 90-100 Irregular. Pts current weight today is 214lbs- which per Amy is improvement.- last reported weight to our office was on 8/6- 219.8lbs Pt LEE has showed improvement. Pt denies any sob, cp, feeling faint, dizzy, fatigued, or in any acute distress at this time.  Pt is asymptomatic, except for the elevated HR.  Nurse Amy is concerned that pt is back in Afib with a rate above 100.  Nurse Amy states that the pt has had lots of med changes in the past week or so.  Pt held his lisinopril this morning, due to BP-100/58.  Pt was recently taken off diltiazem, due to recent low bp in the 56P systolic.  Pt has taken all other prescribed meds this morning.  Amy is concerned that pt needs to have some lab work obtained [employer]) and/or to be seen by someone in the office before scheduled appt on 8/31 with Estella Husk.  Informed Amy that Dr Burt Knack and nurse are both out of the office today, but I will go and talk to the DOD Dr Johnsie Cancel to further review and advise and f/u thereafter with her and the pt.  Amy verbalized understanding and agrees with this plan.

## 2014-08-08 ENCOUNTER — Ambulatory Visit: Payer: PRIVATE HEALTH INSURANCE | Admitting: Nurse Practitioner

## 2014-08-13 NOTE — Telephone Encounter (Signed)
Pt scheduled to see Dr Burt Knack on 08/14/14.

## 2014-08-14 ENCOUNTER — Encounter: Payer: Self-pay | Admitting: Cardiovascular Disease

## 2014-08-14 ENCOUNTER — Ambulatory Visit (INDEPENDENT_AMBULATORY_CARE_PROVIDER_SITE_OTHER): Payer: PRIVATE HEALTH INSURANCE | Admitting: Cardiovascular Disease

## 2014-08-14 VITALS — BP 106/64 | HR 103 | Ht 74.0 in | Wt 224.2 lb

## 2014-08-14 DIAGNOSIS — I5032 Chronic diastolic (congestive) heart failure: Secondary | ICD-10-CM

## 2014-08-14 DIAGNOSIS — I509 Heart failure, unspecified: Secondary | ICD-10-CM

## 2014-08-14 DIAGNOSIS — I4891 Unspecified atrial fibrillation: Secondary | ICD-10-CM

## 2014-08-14 MED ORDER — METOPROLOL TARTRATE 50 MG PO TABS
50.0000 mg | ORAL_TABLET | Freq: Two times a day (BID) | ORAL | Status: DC
Start: 2014-08-14 — End: 2015-12-23

## 2014-08-14 NOTE — Progress Notes (Signed)
HPI:   [name] returns for follow-up evaluation. He is a complicated 78 year-old gentleman with multiple medical problems. These include CAD with history of NSTEMI and PCI in 4098, diastolic heart failure, PAD with history of left fem-pop bypass, hypertension, CKD, atrial fibrillation, and alcohol/tobacco abuse. He was hospitalized most recently from 7/20 - 07/20/2014 for treatment of congestive heart failure. He responded well to IV diuretics and fluid restriction. He also received extensive education regarding fluid/sodium restriction.   Since he was hospitalized in June, he has quit smoking and drinking alcohol. The patient is feeling better. We have had to adjust his diuretics through the home health nurse visits. He has had some low blood pressures and we have reduced his antihypertensives. The patient has developed atrial fibrillation and he was felt to be a poor candidate for anticoagulation. He's been continued on aspirin and cilostazol.  Outpatient Encounter Prescriptions as of 08/14/2014  Medication Sig  . albuterol (PROVENTIL HFA;VENTOLIN HFA) 108 (90 BASE) MCG/ACT inhaler Inhale 2 puffs into the lungs every 6 (six) hours as needed for shortness of breath.  Marland Kitchen aspirin 325 MG EC tablet Take 325 mg by mouth daily.   Marland Kitchen atorvastatin (LIPITOR) 40 MG tablet Take 1 tablet (40 mg total) by mouth daily.  . cilostazol (PLETAL) 50 MG tablet Take 50 mg by mouth 2 (two) times daily.  . divalproex (DEPAKOTE) 250 MG EC tablet Take 500 mg by mouth 2 (two) times daily.   . finasteride (PROSCAR) 5 MG tablet Take 5 mg by mouth daily.    . furosemide (LASIX) 40 MG tablet Take 40 mg by mouth 2 (two) times daily.   Marland Kitchen gabapentin (NEURONTIN) 300 MG capsule Take 300 mg by mouth 3 (three) times daily.  . insulin glargine (LANTUS) 100 UNIT/ML injection Inject 40 Units into the skin daily.  . insulin regular (HUMULIN R,NOVOLIN R) 100 units/mL injection Inject 8 Units into the skin 2 (two) times daily before a  meal.   . pantoprazole (PROTONIX) 20 MG tablet Take 20 mg by mouth daily.  . sildenafil (VIAGRA) 100 MG tablet Take 100 mg by mouth daily as needed for erectile dysfunction.  Marland Kitchen spironolactone (ALDACTONE) 25 MG tablet Take 25 mg by mouth daily.  . Tamsulosin HCl (FLOMAX) 0.4 MG CAPS Take 0.4 mg by mouth daily.    . travoprost, benzalkonium, (TRAVATAN) 0.004 % ophthalmic solution Place 1 drop into both eyes at bedtime.    . [DISCONTINUED] carvedilol (COREG) 6.25 MG tablet Take 1 tablet (6.25 mg total) by mouth 2 (two) times daily.  . [DISCONTINUED] insulin glargine (LANTUS) 100 UNIT/ML injection Inject 0.5 mLs (50 Units total) into the skin daily.  . [DISCONTINUED] nicotine (NICODERM CQ - DOSED IN MG/24 HOURS) 14 mg/24hr patch Place 1 patch (14 mg total) onto the skin daily.  . metoprolol (LOPRESSOR) 50 MG tablet Take 1 tablet (50 mg total) by mouth 2 (two) times daily.  . [DISCONTINUED] lisinopril (PRINIVIL,ZESTRIL) 20 MG tablet Take 1 tablet (20 mg total) by mouth daily.  . [DISCONTINUED] predniSONE (DELTASONE) 20 MG tablet Take 20 mg by mouth daily.  . [DISCONTINUED] rosuvastatin (CRESTOR) 20 MG tablet Take 20 mg by mouth daily.    Allergies  Allergen Reactions  . Penicillins Other (See Comments)     hands and feet edema    Past Medical History  Diagnosis Date  . Hyperlipidemia   . HTN (hypertension)     echo 2/11: EF 50-55%, diast dyfxn, severe LVH, inf HK, LAE  .  CAD (coronary artery disease)     a.  NSTEMI treated with PCI Feb 2011 with a DES.([facility]);   b. cath 2/11: D1 stents x 2 ok, AV groove CFX occluded with dist AV CFX filled by L-L collats, RCA occluded, OM2 90-95% (treated with PCI)  . MI (myocardial infarction)   . PVD (peripheral vascular disease)   . DM (diabetes mellitus)   . GERD (gastroesophageal reflux disease)   . Bipolar disorder   . Prostate cancer     status post radiation therapy in 2003  . Pulmonary edema   . Atrial fibrillation   . Respiratory  difficulty 06/24/2014    intubated   . Hypertensive emergency 06/24/2014  . CKD (chronic kidney disease), stage III     ROS: Negative except as per HPI  BP 106/64  Pulse 103  Ht 6\' 2"  (1.88 m)  Wt 224 lb 3.2 oz (101.696 kg)  BMI 28.77 kg/m2  PHYSICAL EXAM: Pt is alert and oriented, NAD HEENT: normal Neck: JVP - moderately elevated, carotids 2+= without bruits Lungs: CTA bilaterally CV: RRR without murmur or gallop Abd: soft, NT, Positive BS, no hepatomegaly Ext: 2+ pretibial edema bilaterally, distal pulses intact and equal Skin: warm/dry no rash  Echo 06/24/2014: Study Conclusions  - Left ventricle: The cavity size was normal. There was moderate concentric hypertrophy. Systolic function was normal. The estimated ejection fraction was in the range of 50% to 55%. Hypokinesis of the inferior myocardium. Doppler parameters are consistent with a reversible restrictive pattern, indicative of decreased left ventricular diastolic compliance and/or increased left atrial pressure (grade 3 diastolic dysfunction). - Left atrium: The atrium was moderately dilated.  ASSESSMENT AND PLAN: 1. Acute on chronic diastolic heart failure. The patient appears volume overloaded on exam. He has been a little difficult to manage because of low blood pressures. I have recommended that we stop carvedilol and start metoprolol for more beta 1 selective DM last antihypertensive potency. Also will increase furosemide to 80 mg twice daily until he is back to baseline weight.  2. Atrial fibrillation. Heart rate is mildly elevated. Change carvedilol to metoprolol. Otherwise continue current medical therapy. He has improved with respect to compliance and may need to consider anticoagulation down the road. I would like to see that he continues on the right path before starting him on an anticoagulant drug.  3. Hypertension. Blood pressure has been low. Medications have been reduced. For now will continue with  metoprolol, furosemide, and spironolactone.  4. CAD, native vessel. No anginal symptoms.  5. Hyperlipidemia. Patient is on high-dose atorvastatin and seems to be tolerating this reasonably well. His labs have been monitored through the [place] medical system.  Sherren Mocha 08/17/2014 7:56 AM

## 2014-08-14 NOTE — Patient Instructions (Signed)
Your physician has recommended you make the following change in your medication:  1. STOP Carvedilol 2. START Metoprolol Tartrate 50mg  take one by mouth twice a day 3. INCREASE Furosemide to 80mg  twice a day for 3 days or until back to "dry weight" on 216 lbs on home scale, then go back to 40mg  twice a day  Your physician recommends that you keep your scheduled follow-up appointment on 08/27/14.   Your physician recommends that you have lab work: BMP on 8/27 or 8/28 with Home Health--fax results to [phone]

## 2014-08-17 ENCOUNTER — Encounter: Payer: Self-pay | Admitting: Cardiovascular Disease

## 2014-08-22 ENCOUNTER — Encounter: Payer: Self-pay | Admitting: Cardiovascular Disease

## 2014-08-24 ENCOUNTER — Ambulatory Visit (HOSPITAL_COMMUNITY)
Admission: RE | Admit: 2014-08-24 | Discharge: 2014-08-24 | Disposition: A | Discharge status: Home / Self Care | Payer: PRIVATE HEALTH INSURANCE | Source: Ambulatory Visit | Attending: Internal Medicine | Admitting: Internal Medicine

## 2014-08-24 DIAGNOSIS — K219 Gastro-esophageal reflux disease without esophagitis: Secondary | ICD-10-CM | POA: Diagnosis present

## 2014-08-24 DIAGNOSIS — Z794 Long term (current) use of insulin: Secondary | ICD-10-CM

## 2014-08-24 DIAGNOSIS — E876 Hypokalemia: Secondary | ICD-10-CM | POA: Diagnosis present

## 2014-08-24 DIAGNOSIS — F1020 Alcohol dependence, uncomplicated: Secondary | ICD-10-CM | POA: Diagnosis present

## 2014-08-24 DIAGNOSIS — G8190 Hemiplegia, unspecified affecting unspecified side: Secondary | ICD-10-CM | POA: Diagnosis present

## 2014-08-24 DIAGNOSIS — R471 Dysarthria and anarthria: Secondary | ICD-10-CM | POA: Diagnosis present

## 2014-08-24 DIAGNOSIS — I739 Peripheral vascular disease, unspecified: Secondary | ICD-10-CM | POA: Diagnosis present

## 2014-08-24 DIAGNOSIS — I5032 Chronic diastolic (congestive) heart failure: Secondary | ICD-10-CM | POA: Diagnosis present

## 2014-08-24 DIAGNOSIS — E669 Obesity, unspecified: Secondary | ICD-10-CM | POA: Diagnosis present

## 2014-08-24 DIAGNOSIS — N183 Chronic kidney disease, stage 3 unspecified: Secondary | ICD-10-CM | POA: Diagnosis present

## 2014-08-24 DIAGNOSIS — I10 Essential (primary) hypertension: Secondary | ICD-10-CM | POA: Diagnosis not present

## 2014-08-24 DIAGNOSIS — Z7982 Long term (current) use of aspirin: Secondary | ICD-10-CM

## 2014-08-24 DIAGNOSIS — Z91199 Patient's noncompliance with other medical treatment and regimen due to unspecified reason: Secondary | ICD-10-CM

## 2014-08-24 DIAGNOSIS — I252 Old myocardial infarction: Secondary | ICD-10-CM

## 2014-08-24 DIAGNOSIS — Z87891 Personal history of nicotine dependence: Secondary | ICD-10-CM

## 2014-08-24 DIAGNOSIS — I6340 Cerebral infarction due to embolism of unspecified cerebral artery: Secondary | ICD-10-CM | POA: Diagnosis not present

## 2014-08-24 DIAGNOSIS — Z8546 Personal history of malignant neoplasm of prostate: Secondary | ICD-10-CM

## 2014-08-24 DIAGNOSIS — I4510 Unspecified right bundle-branch block: Secondary | ICD-10-CM | POA: Diagnosis present

## 2014-08-24 DIAGNOSIS — I509 Heart failure, unspecified: Secondary | ICD-10-CM | POA: Diagnosis present

## 2014-08-24 DIAGNOSIS — N400 Enlarged prostate without lower urinary tract symptoms: Secondary | ICD-10-CM | POA: Diagnosis present

## 2014-08-24 DIAGNOSIS — E785 Hyperlipidemia, unspecified: Secondary | ICD-10-CM | POA: Diagnosis present

## 2014-08-24 DIAGNOSIS — Z6829 Body mass index (BMI) 29.0-29.9, adult: Secondary | ICD-10-CM

## 2014-08-24 DIAGNOSIS — I2510 Atherosclerotic heart disease of native coronary artery without angina pectoris: Secondary | ICD-10-CM | POA: Diagnosis present

## 2014-08-24 DIAGNOSIS — I129 Hypertensive chronic kidney disease with stage 1 through stage 4 chronic kidney disease, or unspecified chronic kidney disease: Secondary | ICD-10-CM | POA: Diagnosis present

## 2014-08-24 DIAGNOSIS — I4891 Unspecified atrial fibrillation: Secondary | ICD-10-CM | POA: Diagnosis present

## 2014-08-24 DIAGNOSIS — Z9119 Patient's noncompliance with other medical treatment and regimen: Secondary | ICD-10-CM

## 2014-08-24 DIAGNOSIS — F319 Bipolar disorder, unspecified: Secondary | ICD-10-CM | POA: Diagnosis present

## 2014-08-24 DIAGNOSIS — E119 Type 2 diabetes mellitus without complications: Secondary | ICD-10-CM | POA: Diagnosis present

## 2014-08-24 LAB — PULMONARY FUNCTION TEST
DL/VA % pred: 54 %
DL/VA: 2.61 ml/min/mmHg/L
DLCO UNC: 9.87 ml/min/mmHg
DLCO cor % pred: 26 %
DLCO cor: 9.87 ml/min/mmHg
DLCO unc % pred: 26 %
FEF 25-75 Post: 1.45 L/sec
FEF 25-75 Pre: 0.62 L/sec
FEF2575-%Change-Post: 133 %
FEF2575-%Pred-Post: 55 %
FEF2575-%Pred-Pre: 23 %
FEV1-%CHANGE-POST: 28 %
FEV1-%Pred-Post: 57 %
FEV1-%Pred-Pre: 44 %
FEV1-POST: 1.87 L
FEV1-Pre: 1.45 L
FEV1FVC-%Change-Post: 7 %
FEV1FVC-%Pred-Pre: 78 %
FEV6-%CHANGE-POST: 21 %
FEV6-%Pred-Post: 69 %
FEV6-%Pred-Pre: 56 %
FEV6-PRE: 2.36 L
FEV6-Post: 2.87 L
FEV6FVC-%Change-Post: 1 %
FEV6FVC-%PRED-POST: 101 %
FEV6FVC-%PRED-PRE: 99 %
FVC-%Change-Post: 19 %
FVC-%PRED-POST: 68 %
FVC-%Pred-Pre: 57 %
FVC-PRE: 2.48 L
FVC-Post: 2.98 L
POST FEV6/FVC RATIO: 97 %
Post FEV1/FVC ratio: 63 %
Pre FEV1/FVC ratio: 59 %
Pre FEV6/FVC Ratio: 95 %
RV % PRED: 146 %
RV: 4.08 L
TLC % pred: 83 %
TLC: 6.58 L

## 2014-08-24 MED ORDER — ALBUTEROL SULFATE (2.5 MG/3ML) 0.083% IN NEBU
2.5000 mg | INHALATION_SOLUTION | Freq: Once | RESPIRATORY_TRACT | Status: AC
  Administered 2014-08-24: 2.5 mg via RESPIRATORY_TRACT

## 2014-08-25 ENCOUNTER — Emergency Department (HOSPITAL_COMMUNITY): Payer: PRIVATE HEALTH INSURANCE

## 2014-08-25 ENCOUNTER — Inpatient Hospital Stay (HOSPITAL_COMMUNITY): Payer: PRIVATE HEALTH INSURANCE

## 2014-08-25 ENCOUNTER — Encounter (HOSPITAL_COMMUNITY): Payer: Self-pay | Admitting: Emergency Medicine

## 2014-08-25 ENCOUNTER — Inpatient Hospital Stay (HOSPITAL_COMMUNITY)
Admission: EM | Admit: 2014-08-25 | Discharge: 2014-08-28 | DRG: 065 | Disposition: A | Discharge status: Home / Self Care | Payer: PRIVATE HEALTH INSURANCE | Attending: Internal Medicine | Admitting: Internal Medicine

## 2014-08-25 DIAGNOSIS — Z7982 Long term (current) use of aspirin: Secondary | ICD-10-CM | POA: Diagnosis not present

## 2014-08-25 DIAGNOSIS — R079 Chest pain, unspecified: Secondary | ICD-10-CM

## 2014-08-25 DIAGNOSIS — Z720 Tobacco use: Secondary | ICD-10-CM | POA: Diagnosis present

## 2014-08-25 DIAGNOSIS — Z8546 Personal history of malignant neoplasm of prostate: Secondary | ICD-10-CM | POA: Diagnosis not present

## 2014-08-25 DIAGNOSIS — I10 Essential (primary) hypertension: Secondary | ICD-10-CM | POA: Diagnosis present

## 2014-08-25 DIAGNOSIS — N183 Chronic kidney disease, stage 3 unspecified: Secondary | ICD-10-CM | POA: Diagnosis present

## 2014-08-25 DIAGNOSIS — I739 Peripheral vascular disease, unspecified: Secondary | ICD-10-CM | POA: Diagnosis present

## 2014-08-25 DIAGNOSIS — I2510 Atherosclerotic heart disease of native coronary artery without angina pectoris: Secondary | ICD-10-CM | POA: Diagnosis present

## 2014-08-25 DIAGNOSIS — I6340 Cerebral infarction due to embolism of unspecified cerebral artery: Secondary | ICD-10-CM | POA: Diagnosis present

## 2014-08-25 DIAGNOSIS — E1121 Type 2 diabetes mellitus with diabetic nephropathy: Secondary | ICD-10-CM

## 2014-08-25 DIAGNOSIS — K219 Gastro-esophageal reflux disease without esophagitis: Secondary | ICD-10-CM | POA: Diagnosis present

## 2014-08-25 DIAGNOSIS — F319 Bipolar disorder, unspecified: Secondary | ICD-10-CM | POA: Diagnosis present

## 2014-08-25 DIAGNOSIS — E1129 Type 2 diabetes mellitus with other diabetic kidney complication: Secondary | ICD-10-CM | POA: Diagnosis present

## 2014-08-25 DIAGNOSIS — E119 Type 2 diabetes mellitus without complications: Secondary | ICD-10-CM | POA: Diagnosis present

## 2014-08-25 DIAGNOSIS — Z794 Long term (current) use of insulin: Secondary | ICD-10-CM | POA: Diagnosis not present

## 2014-08-25 DIAGNOSIS — I252 Old myocardial infarction: Secondary | ICD-10-CM | POA: Diagnosis not present

## 2014-08-25 DIAGNOSIS — Z91199 Patient's noncompliance with other medical treatment and regimen due to unspecified reason: Secondary | ICD-10-CM | POA: Diagnosis not present

## 2014-08-25 DIAGNOSIS — E669 Obesity, unspecified: Secondary | ICD-10-CM | POA: Diagnosis present

## 2014-08-25 DIAGNOSIS — I219 Acute myocardial infarction, unspecified: Secondary | ICD-10-CM | POA: Diagnosis present

## 2014-08-25 DIAGNOSIS — G8190 Hemiplegia, unspecified affecting unspecified side: Secondary | ICD-10-CM | POA: Diagnosis present

## 2014-08-25 DIAGNOSIS — Z9861 Coronary angioplasty status: Secondary | ICD-10-CM

## 2014-08-25 DIAGNOSIS — E1169 Type 2 diabetes mellitus with other specified complication: Secondary | ICD-10-CM | POA: Diagnosis present

## 2014-08-25 DIAGNOSIS — N400 Enlarged prostate without lower urinary tract symptoms: Secondary | ICD-10-CM | POA: Diagnosis present

## 2014-08-25 DIAGNOSIS — Z87891 Personal history of nicotine dependence: Secondary | ICD-10-CM | POA: Diagnosis not present

## 2014-08-25 DIAGNOSIS — J9601 Acute respiratory failure with hypoxia: Secondary | ICD-10-CM

## 2014-08-25 DIAGNOSIS — F1020 Alcohol dependence, uncomplicated: Secondary | ICD-10-CM | POA: Diagnosis present

## 2014-08-25 DIAGNOSIS — I5033 Acute on chronic diastolic (congestive) heart failure: Secondary | ICD-10-CM

## 2014-08-25 DIAGNOSIS — I4891 Unspecified atrial fibrillation: Secondary | ICD-10-CM | POA: Diagnosis present

## 2014-08-25 DIAGNOSIS — I5032 Chronic diastolic (congestive) heart failure: Secondary | ICD-10-CM | POA: Diagnosis present

## 2014-08-25 DIAGNOSIS — J811 Chronic pulmonary edema: Secondary | ICD-10-CM | POA: Diagnosis present

## 2014-08-25 DIAGNOSIS — Z6829 Body mass index (BMI) 29.0-29.9, adult: Secondary | ICD-10-CM | POA: Diagnosis not present

## 2014-08-25 DIAGNOSIS — R2990 Unspecified symptoms and signs involving the nervous system: Secondary | ICD-10-CM

## 2014-08-25 DIAGNOSIS — I509 Heart failure, unspecified: Secondary | ICD-10-CM | POA: Diagnosis present

## 2014-08-25 DIAGNOSIS — I482 Chronic atrial fibrillation, unspecified: Secondary | ICD-10-CM | POA: Diagnosis present

## 2014-08-25 DIAGNOSIS — R609 Edema, unspecified: Secondary | ICD-10-CM

## 2014-08-25 DIAGNOSIS — F17200 Nicotine dependence, unspecified, uncomplicated: Secondary | ICD-10-CM

## 2014-08-25 DIAGNOSIS — E785 Hyperlipidemia, unspecified: Secondary | ICD-10-CM

## 2014-08-25 DIAGNOSIS — I129 Hypertensive chronic kidney disease with stage 1 through stage 4 chronic kidney disease, or unspecified chronic kidney disease: Secondary | ICD-10-CM | POA: Diagnosis present

## 2014-08-25 DIAGNOSIS — I639 Cerebral infarction, unspecified: Secondary | ICD-10-CM | POA: Diagnosis present

## 2014-08-25 DIAGNOSIS — I6350 Cerebral infarction due to unspecified occlusion or stenosis of unspecified cerebral artery: Secondary | ICD-10-CM

## 2014-08-25 DIAGNOSIS — E876 Hypokalemia: Secondary | ICD-10-CM | POA: Diagnosis present

## 2014-08-25 DIAGNOSIS — I4510 Unspecified right bundle-branch block: Secondary | ICD-10-CM | POA: Diagnosis present

## 2014-08-25 DIAGNOSIS — R471 Dysarthria and anarthria: Secondary | ICD-10-CM | POA: Diagnosis present

## 2014-08-25 LAB — RAPID URINE DRUG SCREEN, HOSP PERFORMED
Amphetamines: NOT DETECTED
Barbiturates: NOT DETECTED
Benzodiazepines: NOT DETECTED
COCAINE: NOT DETECTED
OPIATES: NOT DETECTED
Tetrahydrocannabinol: NOT DETECTED

## 2014-08-25 LAB — DIFFERENTIAL
BASOS ABS: 0.1 10*3/uL (ref 0.0–0.1)
Basophils Relative: 1 % (ref 0–1)
EOS PCT: 5 % (ref 0–5)
Eosinophils Absolute: 0.4 10*3/uL (ref 0.0–0.7)
LYMPHS ABS: 2.7 10*3/uL (ref 0.7–4.0)
Lymphocytes Relative: 36 % (ref 12–46)
MONO ABS: 1 10*3/uL (ref 0.1–1.0)
Monocytes Relative: 14 % — ABNORMAL HIGH (ref 3–12)
Neutro Abs: 3.2 10*3/uL (ref 1.7–7.7)
Neutrophils Relative %: 44 % (ref 43–77)

## 2014-08-25 LAB — GLUCOSE, CAPILLARY: Glucose-Capillary: 244 mg/dL — ABNORMAL HIGH (ref 70–99)

## 2014-08-25 LAB — COMPREHENSIVE METABOLIC PANEL
ALT: 5 U/L (ref 0–53)
AST: 14 U/L (ref 0–37)
Albumin: 2.9 g/dL — ABNORMAL LOW (ref 3.5–5.2)
Alkaline Phosphatase: 35 U/L — ABNORMAL LOW (ref 39–117)
Anion gap: 13 (ref 5–15)
BUN: 25 mg/dL — ABNORMAL HIGH (ref 6–23)
CALCIUM: 8.9 mg/dL (ref 8.4–10.5)
CO2: 30 meq/L (ref 19–32)
CREATININE: 1.7 mg/dL — AB (ref 0.50–1.35)
Chloride: 96 mEq/L (ref 96–112)
GFR calc non Af Amer: 38 mL/min — ABNORMAL LOW (ref >90)
GFR, EST AFRICAN AMERICAN: 44 mL/min — AB (ref >90)
GLUCOSE: 209 mg/dL — AB (ref 70–99)
Potassium: 3.8 mEq/L (ref 3.7–5.3)
Sodium: 139 mEq/L (ref 137–147)
Total Bilirubin: 0.5 mg/dL (ref 0.3–1.2)
Total Protein: 7.3 g/dL (ref 6.0–8.3)

## 2014-08-25 LAB — I-STAT CHEM 8, ED
BUN: 25 mg/dL — ABNORMAL HIGH (ref 6–23)
CREATININE: 1.8 mg/dL — AB (ref 0.50–1.35)
Calcium, Ion: 1.16 mmol/L (ref 1.13–1.30)
Chloride: 96 mEq/L (ref 96–112)
Glucose, Bld: 219 mg/dL — ABNORMAL HIGH (ref 70–99)
HEMATOCRIT: 43 % (ref 39.0–52.0)
HEMOGLOBIN: 14.6 g/dL (ref 13.0–17.0)
Potassium: 3.8 mEq/L (ref 3.7–5.3)
SODIUM: 140 meq/L (ref 137–147)
TCO2: 33 mmol/L (ref 0–100)

## 2014-08-25 LAB — CBC
HEMATOCRIT: 37.7 % — AB (ref 39.0–52.0)
Hemoglobin: 12.4 g/dL — ABNORMAL LOW (ref 13.0–17.0)
MCH: 29.1 pg (ref 26.0–34.0)
MCHC: 32.9 g/dL (ref 30.0–36.0)
MCV: 88.5 fL (ref 78.0–100.0)
Platelets: 280 10*3/uL (ref 150–400)
RBC: 4.26 MIL/uL (ref 4.22–5.81)
RDW: 15.5 % (ref 11.5–15.5)
WBC: 7.3 10*3/uL (ref 4.0–10.5)

## 2014-08-25 LAB — URINALYSIS, ROUTINE W REFLEX MICROSCOPIC
BILIRUBIN URINE: NEGATIVE
Glucose, UA: NEGATIVE mg/dL
Ketones, ur: NEGATIVE mg/dL
Leukocytes, UA: NEGATIVE
Nitrite: NEGATIVE
PH: 6.5 (ref 5.0–8.0)
Protein, ur: NEGATIVE mg/dL
SPECIFIC GRAVITY, URINE: 1.008 (ref 1.005–1.030)
Urobilinogen, UA: 1 mg/dL (ref 0.0–1.0)

## 2014-08-25 LAB — ETHANOL

## 2014-08-25 LAB — VALPROIC ACID LEVEL: VALPROIC ACID LVL: 48.5 ug/mL — AB (ref 50.0–100.0)

## 2014-08-25 LAB — MRSA PCR SCREENING: MRSA by PCR: NEGATIVE

## 2014-08-25 LAB — PROTIME-INR
INR: 1.08 (ref 0.00–1.49)
Prothrombin Time: 14 seconds (ref 11.6–15.2)

## 2014-08-25 LAB — I-STAT TROPONIN, ED: Troponin i, poc: 0.02 ng/mL (ref 0.00–0.08)

## 2014-08-25 LAB — PRO B NATRIURETIC PEPTIDE: Pro B Natriuretic peptide (BNP): 2499 pg/mL — ABNORMAL HIGH (ref 0–450)

## 2014-08-25 LAB — URINE MICROSCOPIC-ADD ON

## 2014-08-25 LAB — APTT: aPTT: 34 seconds (ref 24–37)

## 2014-08-25 MED ORDER — METOPROLOL TARTRATE 1 MG/ML IV SOLN: 5.0000 mg | INTRAVENOUS | Status: DC | PRN

## 2014-08-25 MED ORDER — TAMSULOSIN HCL 0.4 MG PO CAPS
0.4000 mg | ORAL_CAPSULE | Freq: Every day | ORAL | Status: DC
  Administered 2014-08-26 – 2014-08-28 (×3): 0.4 mg via ORAL
  Filled 2014-08-25 (×3): qty 1

## 2014-08-25 MED ORDER — METOPROLOL TARTRATE 1 MG/ML IV SOLN
5.0000 mg | INTRAVENOUS | Status: DC
  Administered 2014-08-25 – 2014-08-26 (×4): 5 mg via INTRAVENOUS
  Filled 2014-08-25 (×9): qty 5

## 2014-08-25 MED ORDER — NITROGLYCERIN 2 % TD OINT
1.0000 [in_us] | TOPICAL_OINTMENT | Freq: Four times a day (QID) | TRANSDERMAL | Status: DC
  Administered 2014-08-25 – 2014-08-26 (×3): 1 [in_us] via TOPICAL
  Filled 2014-08-25: qty 30
  Filled 2014-08-25: qty 1

## 2014-08-25 MED ORDER — ALBUTEROL SULFATE (2.5 MG/3ML) 0.083% IN NEBU: 3.0000 mL | INHALATION_SOLUTION | Freq: Four times a day (QID) | RESPIRATORY_TRACT | Status: DC | PRN

## 2014-08-25 MED ORDER — INSULIN ASPART 100 UNIT/ML ~~LOC~~ SOLN
0.0000 [IU] | SUBCUTANEOUS | Status: DC
  Administered 2014-08-25 – 2014-08-26 (×2): 3 [IU] via SUBCUTANEOUS
  Administered 2014-08-26: 9 [IU] via SUBCUTANEOUS

## 2014-08-25 MED ORDER — HEPARIN SODIUM (PORCINE) 5000 UNIT/ML IJ SOLN
5000.0000 [IU] | Freq: Three times a day (TID) | INTRAMUSCULAR | Status: DC
  Administered 2014-08-25 – 2014-08-27 (×6): 5000 [IU] via SUBCUTANEOUS
  Filled 2014-08-25 (×7): qty 1

## 2014-08-25 MED ORDER — INSULIN GLARGINE 100 UNIT/ML ~~LOC~~ SOLN
10.0000 [IU] | Freq: Every day | SUBCUTANEOUS | Status: DC
  Administered 2014-08-26: 10 [IU] via SUBCUTANEOUS
  Filled 2014-08-25: qty 0.1

## 2014-08-25 MED ORDER — MORPHINE SULFATE 2 MG/ML IJ SOLN: 1.0000 mg | INTRAMUSCULAR | Status: DC | PRN

## 2014-08-25 MED ORDER — ATORVASTATIN CALCIUM 40 MG PO TABS
40.0000 mg | ORAL_TABLET | Freq: Every day | ORAL | Status: DC
  Administered 2014-08-26 – 2014-08-28 (×3): 40 mg via ORAL
  Filled 2014-08-25 (×4): qty 1

## 2014-08-25 MED ORDER — VALPROATE SODIUM 500 MG/5ML IV SOLN
500.0000 mg | Freq: Two times a day (BID) | INTRAVENOUS | Status: DC
  Administered 2014-08-25 – 2014-08-26 (×3): 500 mg via INTRAVENOUS
  Filled 2014-08-25 (×6): qty 5

## 2014-08-25 MED ORDER — STROKE: EARLY STAGES OF RECOVERY BOOK
Freq: Once | Status: AC
  Administered 2014-08-25: 21:00:00
  Filled 2014-08-25: qty 1

## 2014-08-25 MED ORDER — FUROSEMIDE 10 MG/ML IJ SOLN
80.0000 mg | Freq: Once | INTRAMUSCULAR | Status: AC
  Administered 2014-08-25: 80 mg via INTRAVENOUS
  Filled 2014-08-25: qty 8

## 2014-08-25 MED ORDER — GUAIFENESIN-DM 100-10 MG/5ML PO SYRP: 5.0000 mL | ORAL_SOLUTION | ORAL | Status: DC | PRN

## 2014-08-25 MED ORDER — SODIUM CHLORIDE 0.9 % IJ SOLN
3.0000 mL | Freq: Two times a day (BID) | INTRAMUSCULAR | Status: DC
  Administered 2014-08-26 – 2014-08-27 (×2): 3 mL via INTRAVENOUS

## 2014-08-25 MED ORDER — ASPIRIN 325 MG PO TABS
325.0000 mg | ORAL_TABLET | Freq: Every day | ORAL | Status: DC
  Administered 2014-08-26 – 2014-08-27 (×2): 325 mg via ORAL
  Filled 2014-08-25 (×3): qty 1

## 2014-08-25 MED ORDER — CHLORHEXIDINE GLUCONATE 0.12 % MT SOLN
15.0000 mL | Freq: Two times a day (BID) | OROMUCOSAL | Status: DC
  Administered 2014-08-25: 15 mL via OROMUCOSAL
  Filled 2014-08-25 (×4): qty 15

## 2014-08-25 MED ORDER — CILOSTAZOL 50 MG PO TABS
50.0000 mg | ORAL_TABLET | Freq: Two times a day (BID) | ORAL | Status: DC
  Administered 2014-08-26: 50 mg via ORAL
  Filled 2014-08-25 (×3): qty 1

## 2014-08-25 MED ORDER — POLYETHYLENE GLYCOL 3350 17 G PO PACK
17.0000 g | PACK | Freq: Every day | ORAL | Status: DC | PRN
  Filled 2014-08-25: qty 1

## 2014-08-25 MED ORDER — INSULIN GLARGINE 100 UNIT/ML ~~LOC~~ SOLN
20.0000 [IU] | Freq: Every day | SUBCUTANEOUS | Status: DC
  Filled 2014-08-25: qty 0.2

## 2014-08-25 MED ORDER — FINASTERIDE 5 MG PO TABS
5.0000 mg | ORAL_TABLET | Freq: Every day | ORAL | Status: DC
  Administered 2014-08-26 – 2014-08-28 (×3): 5 mg via ORAL
  Filled 2014-08-25 (×5): qty 1

## 2014-08-25 MED ORDER — INSULIN ASPART 100 UNIT/ML ~~LOC~~ SOLN: 0.0000 [IU] | SUBCUTANEOUS | Status: DC

## 2014-08-25 MED ORDER — CETYLPYRIDINIUM CHLORIDE 0.05 % MT LIQD
7.0000 mL | Freq: Two times a day (BID) | OROMUCOSAL | Status: DC
  Administered 2014-08-25: 7 mL via OROMUCOSAL

## 2014-08-25 MED ORDER — FUROSEMIDE 10 MG/ML IJ SOLN
40.0000 mg | Freq: Two times a day (BID) | INTRAMUSCULAR | Status: DC
  Administered 2014-08-25 – 2014-08-26 (×2): 40 mg via INTRAVENOUS
  Filled 2014-08-25 (×3): qty 4

## 2014-08-25 MED ORDER — SODIUM CHLORIDE 0.9 % IV SOLN
INTRAVENOUS | Status: AC
  Administered 2014-08-25: 19:00:00 via INTRAVENOUS

## 2014-08-25 MED ORDER — ONDANSETRON HCL 4 MG PO TABS: 4.0000 mg | ORAL_TABLET | Freq: Four times a day (QID) | ORAL | Status: DC | PRN

## 2014-08-25 MED ORDER — HYDRALAZINE HCL 20 MG/ML IJ SOLN: 10.0000 mg | Freq: Four times a day (QID) | INTRAMUSCULAR | Status: DC | PRN

## 2014-08-25 MED ORDER — ONDANSETRON HCL 4 MG/2ML IJ SOLN: 4.0000 mg | Freq: Four times a day (QID) | INTRAMUSCULAR | Status: DC | PRN

## 2014-08-25 MED ORDER — TRAVOPROST 0.004 % OP SOLN
1.0000 [drp] | Freq: Every day | OPHTHALMIC | Status: DC
  Administered 2014-08-25 – 2014-08-27 (×3): 1 [drp] via OPHTHALMIC
  Filled 2014-08-25: qty 0.1
  Filled 2014-08-25: qty 2.5

## 2014-08-25 MED ORDER — HYDROCODONE-ACETAMINOPHEN 5-325 MG PO TABS: 1.0000 | ORAL_TABLET | ORAL | Status: DC | PRN

## 2014-08-25 MED ORDER — ASPIRIN 300 MG RE SUPP
300.0000 mg | Freq: Every day | RECTAL | Status: DC
  Administered 2014-08-25: 300 mg via RECTAL
  Filled 2014-08-25 (×2): qty 1

## 2014-08-25 MED ORDER — ALBUTEROL SULFATE (2.5 MG/3ML) 0.083% IN NEBU
2.5000 mg | INHALATION_SOLUTION | RESPIRATORY_TRACT | Status: DC | PRN
Start: 2014-08-25 — End: 2014-08-28

## 2014-08-25 MED ORDER — METOPROLOL TARTRATE 50 MG PO TABS
50.0000 mg | ORAL_TABLET | Freq: Two times a day (BID) | ORAL | Status: DC
  Filled 2014-08-25: qty 1

## 2014-08-25 MED ORDER — DIVALPROEX SODIUM 500 MG PO DR TAB
500.0000 mg | DELAYED_RELEASE_TABLET | Freq: Two times a day (BID) | ORAL | Status: DC
  Filled 2014-08-25: qty 1

## 2014-08-25 NOTE — ED Notes (Signed)
Per [facility] pt has a home health nurse that comes out and checks on him, she was with him yesterday, she took him to his respiratory evaluation and stayed with him until 1530 yesterday. Therefore pt was LSN 08/24/2014 at 1530. She reports they walked outside around the block before she left. She received a phone call from pt this afternoon at 1317 with him requesting her to come over and fix him breakfast, which she reported to EMS she felt was odd since pt is usually alert and oriented x4, she also reported noting slurred speech over the phone. She called 911 immediately. EMS reports pt has a Left arm weakness and slurred speech

## 2014-08-25 NOTE — ED Notes (Signed)
MD James at bedside.  

## 2014-08-25 NOTE — ED Notes (Signed)
Pt reports he went to bed early but does not know the exact time, woke up at 0200 this and denies having any of these symptoms, pt reports waking up at 1230

## 2014-08-25 NOTE — H&P (Signed)
Patient Demographics  [name], is a 78 y.o. male  MRN: [id number]   DOB - 09-Aug-1936  Admit Date - 08/25/2014  Outpatient Primary MD for the patient is [name] Panda, MD   With History of -  Past Medical History  Diagnosis Date  . Hyperlipidemia   . HTN (hypertension)     echo 2/11: EF 50-55%, diast dyfxn, severe LVH, inf HK, LAE  . CAD (coronary artery disease)     a.  NSTEMI treated with PCI Feb 2011 with a DES.([facility]);   b. cath 2/11: D1 stents x 2 ok, AV groove CFX occluded with dist AV CFX filled by L-L collats, RCA occluded, OM2 90-95% (treated with PCI)  . MI (myocardial infarction)   . PVD (peripheral vascular disease)   . DM (diabetes mellitus)   . GERD (gastroesophageal reflux disease)   . Bipolar disorder   . Prostate cancer     status post radiation therapy in 2003  . Pulmonary edema   . Atrial fibrillation   . Respiratory difficulty 06/24/2014    intubated   . Hypertensive emergency 06/24/2014  . CKD (chronic kidney disease), stage III       Past Surgical History  Procedure Laterality Date  . Femoral bypass  2003    in for   Chief Complaint  Patient presents with  . Code Stroke     HPI  [name]  is a 78 y.o. male, with history of atrial fibrillation was on aspirin and ruled not a candidate for anticoagulation by cardiology due to long-standing history of poor balance, alcohol abuse and noncompliance, hypertension, dyslipidemia, type 2 diabetes mellitus non-insulin-dependent, GERD, bipolar disorder, prostate cancer, CKD 3 baseline creatinine around 1.8, chronic diastolic grade 3 CHF with baseline EF of 50-55%, who was recently admitted for acute on chronic respiratory failure secondary to diastolic CHF decompensation, he is now brought in by EMS after home health nurse  called EMS as she noticed over the phone that patient sounded to have difficulty in speech and somewhat confused around 1 PM this afternoon. She had seen the patient in reasonably good health yesterday at home during her home health visit. When EMS arrived they noticed that patient had some difficulty in speech mostly dysarthria with some reported left arm weakness .    In the ER patient was seen by neurology for code stroke, head CT was unremarkable, dysarthria and left arm weakness were confirmed, he was again noted to be in A. fib with right bundle branch block on EKG, patient is minimally confused but still answering all questions appropriately but oriented x1, she denies any headache, no chest abdominal pain, no shortness of breath, denies any palpitations, he does realize that his speech is not at baseline and that he has some left arm weakness. Iam admitting him for stroke workup. Per ER M.D. when he came he appeared short of breath and ER M.D. prefers 24-hour position stepdown.  Review of Systems    In addition to the HPI above,   No Fever-chills, No Headache, No changes with Vision or hearing, Failed bedside swallow eval No Chest pain, Cough or Shortness of Breath, No Abdominal pain, No Nausea or Vommitting, Bowel movements are regular, No Blood in stool or Urine, No dysuria, No new skin rashes or bruises, No new joints pains-aches,  No new weakness, tingling, numbness in any extremity, except left arm weakness and some difficulty in speaking No recent weight gain or loss, No polyuria, polydypsia or polyphagia, No significant Mental Stressors.  A full 10 point Review of Systems was done, except as stated above, all other Review of Systems were negative.   Social History History  Substance Use Topics  . Smoking status: Former Smoker -- 0.50 packs/day for 50 years    Types: Cigarettes    Quit date: 06/30/2014  . Smokeless tobacco: Not on file     Comment: He has a  50-pack-year hx of tobacco abuse. Currently, smoking about half a pack a day.  . Alcohol Use: Yes     Comment: Previously drank heavily, and now has a drink every other day or so.      Family History Family History  Problem Relation Age of Onset  . Cancer Mother   . Cancer Father   . Cancer Sister       Prior to Admission medications   Medication Sig Start Date End Date Taking? Authorizing Provider  aspirin 325 MG EC tablet Take 325 mg by mouth daily.    Yes Historical Provider, MD  atorvastatin (LIPITOR) 40 MG tablet Take 1 tablet (40 mg total) by mouth daily. 08/01/14  Yes Sherren Mocha, MD  cilostazol (PLETAL) 50 MG tablet Take 50 mg by mouth 2 (two) times daily.   Yes Historical Provider, MD  divalproex (DEPAKOTE) 250 MG EC tablet Take 500 mg by mouth 2 (two) times daily.    Yes Historical Provider, MD  finasteride (PROSCAR) 5 MG tablet Take 5 mg by mouth daily.     Yes Historical Provider, MD  furosemide (LASIX) 40 MG tablet Take 40 mg by mouth 2 (two) times daily.  06/29/14  Yes Debbe Odea, MD  gabapentin (NEURONTIN) 300 MG capsule Take 300-600 mg by mouth 2 (two) times daily. 600mg  in the morning and 300mg  in the evening   Yes Historical Provider, MD  insulin glargine (LANTUS) 100 UNIT/ML injection Inject 40 Units into the skin daily. 07/20/14  Yes Jessica U Vann, DO  insulin regular (HUMULIN R,NOVOLIN R) 100 units/mL injection Inject 8 Units into the skin 2 (two) times daily before a meal.    Yes Historical Provider, MD  metoprolol (LOPRESSOR) 50 MG tablet Take 1 tablet (50 mg total) by mouth 2 (two) times daily. 08/14/14  Yes Sherren Mocha, MD  pantoprazole (PROTONIX) 20 MG tablet Take 20 mg by mouth daily.   Yes Historical Provider, MD  spironolactone (ALDACTONE) 25 MG tablet Take 25 mg by mouth daily.   Yes Historical Provider, MD  Tamsulosin HCl (FLOMAX) 0.4 MG CAPS Take 0.4 mg by mouth daily.     Yes Historical Provider, MD  travoprost, benzalkonium, (TRAVATAN) 0.004 %  ophthalmic solution Place 1 drop into both eyes at bedtime.     Yes Historical Provider, MD  albuterol (PROVENTIL HFA;VENTOLIN HFA) 108 (90 BASE) MCG/ACT inhaler Inhale 2 puffs into the lungs every 6 (six) hours as needed for shortness of breath.    Historical Provider, MD  sildenafil (VIAGRA)  100 MG tablet Take 100 mg by mouth daily as needed for erectile dysfunction.    Historical Provider, MD    Allergies  Allergen Reactions  . Penicillins Other (See Comments)     hands and feet edema    Physical Exam  Vitals  Blood pressure 140/80, pulse 97, temperature 98.5 F (36.9 C), temperature source Oral, resp. rate 20, SpO2 91.00%.   1. General elderly African American male lying in bed in NAD,     2. Normal affect and insight, Not Suicidal or Homicidal, Awake Alert, Oriented X 3, some dysarthria with difficulty in articulating words  3. No F.N deficits, ALL C.Nerves Intact, Strength 5/5 all 3 extremities except left upper arm 4/5, Sensation intact all 4 extremities, Plantars down going.  4. Ears and Eyes appear Normal, Conjunctivae clear, PERRLA. Moist Oral Mucosa.  5. Supple Neck, No JVD, No cervical lymphadenopathy appriciated, No Carotid Bruits.  6. Symmetrical Chest wall movement, Good air movement bilaterally, CTAB.  7. iRRR, No Gallops, Rubs or Murmurs, No Parasternal Heave.  8. Positive Bowel Sounds, Abdomen Soft, No tenderness, No organomegaly appriciated,No rebound -guarding or rigidity.  9.  No Cyanosis, Normal Skin Turgor, No Skin Rash or Bruise.  10. Good muscle tone,  joints appear normal , no effusions, Normal ROM.  11. No Palpable Lymph Nodes in Neck or Axillae     Data Review  CBC  Recent Labs Lab 08/25/14 1430 08/25/14 1444  WBC 7.3  --   HGB 12.4* 14.6  HCT 37.7* 43.0  PLT 280  --   MCV 88.5  --   MCH 29.1  --   MCHC 32.9  --   RDW 15.5  --   LYMPHSABS 2.7  --   MONOABS 1.0  --   EOSABS 0.4  --   BASOSABS 0.1  --     ------------------------------------------------------------------------------------------------------------------  Chemistries   Recent Labs Lab 08/25/14 1430 08/25/14 1444  NA 139 140  K 3.8 3.8  CL 96 96  CO2 30  --   GLUCOSE 209* 219*  BUN 25* 25*  CREATININE 1.70* 1.80*  CALCIUM 8.9  --   AST 14  --   ALT 5  --   ALKPHOS 35*  --   BILITOT 0.5  --    ------------------------------------------------------------------------------------------------------------------ CrCl is unknown because both a height and weight (above a minimum accepted value) are required for this calculation. ------------------------------------------------------------------------------------------------------------------ No results found for this basename: TSH, T4TOTAL, FREET3, T3FREE, THYROIDAB,  in the last 72 hours   Coagulation profile  Recent Labs Lab 08/25/14 1430  INR 1.08   ------------------------------------------------------------------------------------------------------------------- No results found for this basename: DDIMER,  in the last 72 hours -------------------------------------------------------------------------------------------------------------------  Cardiac Enzymes No results found for this basename: CK, CKMB, TROPONINI, MYOGLOBIN,  in the last 168 hours ------------------------------------------------------------------------------------------------------------------ No components found with this basename: POCBNP,    ---------------------------------------------------------------------------------------------------------------  Urinalysis    Component Value Date/Time   COLORURINE YELLOW 06/24/2014 1236   APPEARANCEUR CLEAR 06/24/2014 1236   LABSPEC 1.013 06/24/2014 1236   PHURINE 5.5 06/24/2014 1236   GLUCOSEU NEGATIVE 06/24/2014 1236   HGBUR MODERATE* 06/24/2014 1236   BILIRUBINUR NEGATIVE 06/24/2014 [place] 06/24/2014 1236   PROTEINUR 100*  06/24/2014 1236   UROBILINOGEN 1.0 06/24/2014 1236   NITRITE NEGATIVE 06/24/2014 [place] 06/24/2014 1236    ----------------------------------------------------------------------------------------------------------------  Imaging results:   Ct Head Wo Contrast  08/25/2014   CLINICAL DATA:  Slurred speech.  Altered mental status.  EXAM: CT HEAD WITHOUT CONTRAST  TECHNIQUE: Contiguous axial images were obtained from the base of the skull through the vertex without intravenous contrast.  COMPARISON:  None.  FINDINGS: There is atrophy and chronic small vessel disease changes. No acute intracranial abnormality. Specifically, no hemorrhage, hydrocephalus, mass lesion, acute infarction, or significant intracranial injury. No acute calvarial abnormality. Visualized paranasal sinuses and mastoids clear. Orbital soft tissues unremarkable.  IMPRESSION: No acute intracranial abnormality.  Atrophy, chronic microvascular disease.  Critical Value/emergent results were called by telephone at the time of interpretation on 08/25/2014 at 2:59 pm to Dr. Doy Mince, who verbally acknowledged these results.   Electronically Signed   By: Rolm Baptise M.D.   On: 08/25/2014 14:58   Dg Chest Port 1 View  08/25/2014   CLINICAL DATA:  Stroke  EXAM: PORTABLE CHEST - 1 VIEW  COMPARISON:  07/16/2014  FINDINGS: Heart is borderline in size. Mild interstitial prominence again noted, similar to prior study. Cannot exclude interstitial edema. Bibasilar opacities likely reflect scarring and/or atelectasis, similar to prior study. No effusions. No acute bony abnormality.  IMPRESSION: Bibasilar atelectasis or scarring.  Cannot exclude mild interstitial edema.   Electronically Signed   By: Rolm Baptise M.D.   On: 08/25/2014 15:10    My personal review of EKG: Rhythm Afib, RBBB, Rate  106/min,    Assessment & Plan    1. Dysarthria with left arm weakness TIA versus CVA - likely embolic event due to atrial fibrillation,  he has failed bedside swallow eval, for now aspirin rectal suppository, full stroke workup which will include monitoring on tele, obtain PT, OT, speech input, A1c and lipid panel, check MRI MRA brain, echogram, carotid duplex, neurology following. Note he has been decleared not an anticoagulation candidate multiple times in the past for long-standing history of noncompliance, poor balance and past history of alcohol abuse.   2. Atrial fibrillation with underlying right bundle branch block. Goal will be rate controlled, if he fails speech eval by a speech therapist we will keep him on IV Lopressor when necessary, monitor on telemetry, aspirin for now.Note he has been decleared not an anticoagulation candidate multiple times in the past for long-standing history of noncompliance, poor balance and past history of alcohol abuse.   3. DM type II. Currently n.p.o. so we'll cut down Lantus dose to half, every 4 hours CBGs, check A1c.   4. Hypertension. Allow for permissive hypertension, as needed IV hydralazine and Lopressor.   5. Chronic kidney disease stage III. Baseline creatinine appears to be close to 1.8, currently at or better than baseline. Monitor.   6. Chronic diastolic CHF with EF 49%. Currently compensated change Lasix to 40 mg twice a day. Beta blocker orally once taking by mouth.   7. CAD/PAD. Currently compensated, will continue aspirin, statin and beta blocker once taking orally, no acute issues.   8. History of smoking and intermittent alcohol use now. Counseled to quit both. He denies regular alcohol intake. Monitor closely.   9. BPH. On Flomax. Currently on condom catheter    DVT Prophylaxis Heparin    AM Labs Ordered, also please review Full Orders  Family Communication: Admission, patients condition and plan of care including tests being ordered have been discussed with the patient who indicates understanding and agree with the plan and Code Status.  Code Status  Full  Likely DC to TBD   Condition GUARDED    Time spent in minutes : 35    Lala Lund K M.D on 08/25/2014  at 4:47 PM  Between 7am to 7pm - Pager - [phone]  After 7pm go to [email or web address] - password [email or web address]  And look for the night coverage person covering me after hours  [facility] Office  [phone]   **Disclaimer: This note may have been dictated with voice recognition software. Similar sounding words can inadvertently be transcribed and this note may contain transcription errors which may not have been corrected upon publication of note.**

## 2014-08-25 NOTE — ED Notes (Signed)
MD Reynolds at bedside.

## 2014-08-25 NOTE — ED Notes (Addendum)
Attempted report. Number left 

## 2014-08-25 NOTE — ED Notes (Signed)
Attempted foley cath. Unable to pass catheter at this time. Provider notified. Condom cath placed.

## 2014-08-25 NOTE — ED Notes (Signed)
Denise RR RN at bedside, pt returns from CT

## 2014-08-25 NOTE — ED Provider Notes (Signed)
She was seen and evaluated. History of diastolic dysfunction congestive heart failure, IDDM, hypertension, hypercholesterolemia.  Last time no normal was yesterday afternoon at 3:30. He called his home health care nurse today to ask about when she would be coming by. His speech was noted to be markedly slurred. She called 911 and presented to his residence.  For by EMS. He has marked left upper and lower extremity weakness and left facial droop. He is well out of the window for TPA. CT shows no acute abnormalities. Clinically has globally diminished breath sounds and marked dependent edema tachypnea. X-ray shows interstitial fluid consistent with CHF. Plan IV Lasix, nitroglycerin, neuro consult completed.  Tanna Furry, MD 08/25/14 1534

## 2014-08-25 NOTE — ED Notes (Signed)
Condom cath applied

## 2014-08-25 NOTE — ED Notes (Signed)
Patient transported to CT with [name]

## 2014-08-25 NOTE — ED Provider Notes (Signed)
CSN: [id number]     Arrival date & time 08/25/14  1405 History   First MD Initiated Contact with Patient 08/25/14 1406     Chief Complaint  Patient presents with  . Code Stroke     (Consider location/radiation/quality/duration/timing/severity/associated sxs/prior Treatment) The history is provided by the patient. No language interpreter was used.  [name] is a 78 y/o M with PMHx of Atrial fibrillation, CKD, MI, HTN, HLD, CAD, DM, GERD, Bipolar disorder, prostate cancer, pulmonary edema, HTN emergency presenting to the ED with alleged stroke. As per patient reported that he woke up late this morning and stated that he called his daughter. Reported that his daughter called EMS regarding garbled speech and difficulty understanding the patient. Patient was last seen normal at 3:30PM yesterday. Stated that he his left arm and leg feel heavy. Patient follows commands and answers questions well. ROS could not be performed secondary to change to mental status. Denied chest pain, shortness of breath, difficulty breathing.  PCP Dr. Mellody Drown  Past Medical History  Diagnosis Date  . Hyperlipidemia   . HTN (hypertension)     echo 2/11: EF 50-55%, diast dyfxn, severe LVH, inf HK, LAE  . CAD (coronary artery disease)     a.  NSTEMI treated with PCI Feb 2011 with a DES.([facility]);   b. cath 2/11: D1 stents x 2 ok, AV groove CFX occluded with dist AV CFX filled by L-L collats, RCA occluded, OM2 90-95% (treated with PCI)  . MI (myocardial infarction)   . PVD (peripheral vascular disease)   . DM (diabetes mellitus)   . GERD (gastroesophageal reflux disease)   . Bipolar disorder   . Prostate cancer     status post radiation therapy in 2003  . Pulmonary edema   . Atrial fibrillation   . Respiratory difficulty 06/24/2014    intubated   . Hypertensive emergency 06/24/2014  . CKD (chronic kidney disease), stage III    Past Surgical History  Procedure Laterality Date  . Femoral bypass  2003    Family History  Problem Relation Age of Onset  . Cancer Mother   . Cancer Father   . Cancer Sister    History  Substance Use Topics  . Smoking status: Former Smoker -- 0.50 packs/day for 50 years    Types: Cigarettes    Quit date: 06/30/2014  . Smokeless tobacco: Not on file     Comment: He has a 50-pack-year hx of tobacco abuse. Currently, smoking about half a pack a day.  . Alcohol Use: Yes     Comment: Previously drank heavily, and now has a drink every other day or so.    Review of Systems  Unable to perform ROS: Mental status change      Allergies  Penicillins  Home Medications   Prior to Admission medications   Medication Sig Start Date End Date Taking? Authorizing Provider  aspirin 325 MG EC tablet Take 325 mg by mouth daily.    Yes Historical Provider, MD  atorvastatin (LIPITOR) 40 MG tablet Take 1 tablet (40 mg total) by mouth daily. 08/01/14  Yes Sherren Mocha, MD  cilostazol (PLETAL) 50 MG tablet Take 50 mg by mouth 2 (two) times daily.   Yes Historical Provider, MD  divalproex (DEPAKOTE) 250 MG EC tablet Take 500 mg by mouth 2 (two) times daily.    Yes Historical Provider, MD  finasteride (PROSCAR) 5 MG tablet Take 5 mg by mouth daily.     Yes Historical Provider,  MD  furosemide (LASIX) 40 MG tablet Take 40 mg by mouth 2 (two) times daily.  06/29/14  Yes Debbe Odea, MD  gabapentin (NEURONTIN) 300 MG capsule Take 300-600 mg by mouth 2 (two) times daily. 600mg  in the morning and 300mg  in the evening   Yes Historical Provider, MD  insulin glargine (LANTUS) 100 UNIT/ML injection Inject 40 Units into the skin daily. 07/20/14  Yes Jessica U Vann, DO  insulin regular (HUMULIN R,NOVOLIN R) 100 units/mL injection Inject 8 Units into the skin 2 (two) times daily before a meal.    Yes Historical Provider, MD  metoprolol (LOPRESSOR) 50 MG tablet Take 1 tablet (50 mg total) by mouth 2 (two) times daily. 08/14/14  Yes Sherren Mocha, MD  pantoprazole (PROTONIX) 20 MG tablet  Take 20 mg by mouth daily.   Yes Historical Provider, MD  spironolactone (ALDACTONE) 25 MG tablet Take 25 mg by mouth daily.   Yes Historical Provider, MD  Tamsulosin HCl (FLOMAX) 0.4 MG CAPS Take 0.4 mg by mouth daily.     Yes Historical Provider, MD  travoprost, benzalkonium, (TRAVATAN) 0.004 % ophthalmic solution Place 1 drop into both eyes at bedtime.     Yes Historical Provider, MD  albuterol (PROVENTIL HFA;VENTOLIN HFA) 108 (90 BASE) MCG/ACT inhaler Inhale 2 puffs into the lungs every 6 (six) hours as needed for shortness of breath.    Historical Provider, MD  sildenafil (VIAGRA) 100 MG tablet Take 100 mg by mouth daily as needed for erectile dysfunction.    Historical Provider, MD   BP 140/80  Pulse 97  Temp(Src) 98.5 F (36.9 C) (Oral)  Resp 20  SpO2 91% Physical Exam  Nursing note and vitals reviewed. Constitutional: He is oriented to person, place, and time. He appears well-developed and well-nourished. No distress.  HENT:  Head: Normocephalic and atraumatic.  Mouth/Throat: Oropharynx is clear and moist. No oropharyngeal exudate.  Eyes: Conjunctivae and EOM are normal. Pupils are equal, round, and reactive to light. Right eye exhibits no discharge. Left eye exhibits no discharge.  Neck: Normal range of motion. Neck supple. No tracheal deviation present.  Cardiovascular: Normal rate, regular rhythm and normal heart sounds.  Exam reveals no friction rub.   No murmur heard. Pulmonary/Chest: Effort normal. No respiratory distress. He has no wheezes. He has no rales. He exhibits no tenderness.  Patient stable to speak in full sentences without difficulty Negative use of accessory muscles Negative stridor Decreased breath sounds upper lower lobes bilaterally  Patient appears to be using abdomen - retractions noted  Lymphadenopathy:    He has no cervical adenopathy.  Neurological: He is alert and oriented to person, place, and time. He exhibits normal muscle tone. Coordination  normal.  Facial droop identified-favoring the left side Patient able to deviate tongue towards the left Unable to fully close left eyelid Garbled speech that has progressively gotten worse while in ED setting Alert and oriented x3 Patient follows commands well Decreased grip strength localized left upper extremity Decreased strength to the left lower extremity  Sensation intact   Skin: Skin is warm and dry. No rash noted. He is not diaphoretic. No erythema.  Psychiatric: He has a normal mood and affect. His behavior is normal. Thought content normal.    ED Course  Procedures (including critical care time)  4:22 PM This provider spoke with Dr. Candiss Norse - [facility] - discussed case, history, labs, imaging in great detail. Patient to be admitted to [facility] for stroke and history of possible CHF  exacerbation.   Results for orders placed during the hospital encounter of 08/25/14  ETHANOL      Result Value Ref Range   Alcohol, Ethyl (B) <11  0 - 11 mg/dL  PROTIME-INR      Result Value Ref Range   Prothrombin Time 14.0  11.6 - 15.2 seconds   INR 1.08  0.00 - 1.49  APTT      Result Value Ref Range   aPTT 34  24 - 37 seconds  CBC      Result Value Ref Range   WBC 7.3  4.0 - 10.5 K/uL   RBC 4.26  4.22 - 5.81 MIL/uL   Hemoglobin 12.4 (*) 13.0 - 17.0 g/dL   HCT 37.7 (*) 39.0 - 52.0 %   MCV 88.5  78.0 - 100.0 fL   MCH 29.1  26.0 - 34.0 pg   MCHC 32.9  30.0 - 36.0 g/dL   RDW 15.5  11.5 - 15.5 %   Platelets 280  150 - 400 K/uL  DIFFERENTIAL      Result Value Ref Range   Neutrophils Relative % 44  43 - 77 %   Neutro Abs 3.2  1.7 - 7.7 K/uL   Lymphocytes Relative 36  12 - 46 %   Lymphs Abs 2.7  0.7 - 4.0 K/uL   Monocytes Relative 14 (*) 3 - 12 %   Monocytes Absolute 1.0  0.1 - 1.0 K/uL   Eosinophils Relative 5  0 - 5 %   Eosinophils Absolute 0.4  0.0 - 0.7 K/uL   Basophils Relative 1  0 - 1 %   Basophils Absolute 0.1  0.0 - 0.1 K/uL  COMPREHENSIVE METABOLIC PANEL       Result Value Ref Range   Sodium 139  137 - 147 mEq/L   Potassium 3.8  3.7 - 5.3 mEq/L   Chloride 96  96 - 112 mEq/L   CO2 30  19 - 32 mEq/L   Glucose, Bld 209 (*) 70 - 99 mg/dL   BUN 25 (*) 6 - 23 mg/dL   Creatinine, Ser 1.70 (*) 0.50 - 1.35 mg/dL   Calcium 8.9  8.4 - 10.5 mg/dL   Total Protein 7.3  6.0 - 8.3 g/dL   Albumin 2.9 (*) 3.5 - 5.2 g/dL   AST 14  0 - 37 U/L   ALT 5  0 - 53 U/L   Alkaline Phosphatase 35 (*) 39 - 117 U/L   Total Bilirubin 0.5  0.3 - 1.2 mg/dL   GFR calc non Af Amer 38 (*) >90 mL/min   GFR calc Af Amer 44 (*) >90 mL/min   Anion gap 13  5 - 15  URINE RAPID DRUG SCREEN (HOSP PERFORMED)      Result Value Ref Range   Opiates NONE DETECTED  NONE DETECTED   Cocaine NONE DETECTED  NONE DETECTED   Benzodiazepines NONE DETECTED  NONE DETECTED   Amphetamines NONE DETECTED  NONE DETECTED   Tetrahydrocannabinol NONE DETECTED  NONE DETECTED   Barbiturates NONE DETECTED  NONE DETECTED  URINALYSIS, ROUTINE W REFLEX MICROSCOPIC      Result Value Ref Range   Color, Urine YELLOW  YELLOW   APPearance CLEAR  CLEAR   Specific Gravity, Urine 1.008  1.005 - 1.030   pH 6.5  5.0 - 8.0   Glucose, UA NEGATIVE  NEGATIVE mg/dL   Hgb urine dipstick TRACE (*) NEGATIVE   Bilirubin Urine NEGATIVE  NEGATIVE   Ketones, ur  NEGATIVE  NEGATIVE mg/dL   Protein, ur NEGATIVE  NEGATIVE mg/dL   Urobilinogen, UA 1.0  0.0 - 1.0 mg/dL   Nitrite NEGATIVE  NEGATIVE   Leukocytes, UA NEGATIVE  NEGATIVE  PRO B NATRIURETIC PEPTIDE      Result Value Ref Range   Pro B Natriuretic peptide (BNP) 2499.0 (*) 0 - 450 pg/mL  URINE MICROSCOPIC-ADD ON      Result Value Ref Range   RBC / HPF 0-2  <3 RBC/hpf  I-STAT CHEM 8, ED      Result Value Ref Range   Sodium 140  137 - 147 mEq/L   Potassium 3.8  3.7 - 5.3 mEq/L   Chloride 96  96 - 112 mEq/L   BUN 25 (*) 6 - 23 mg/dL   Creatinine, Ser 1.80 (*) 0.50 - 1.35 mg/dL   Glucose, Bld 219 (*) 70 - 99 mg/dL   Calcium, Ion 1.16  1.13 - 1.30 mmol/L   TCO2 33   0 - 100 mmol/L   Hemoglobin 14.6  13.0 - 17.0 g/dL   HCT 43.0  39.0 - 52.0 %  I-STAT TROPOININ, ED      Result Value Ref Range   Troponin i, poc 0.02  0.00 - 0.08 ng/mL   Comment 3             Labs Review Labs Reviewed  CBC - Abnormal; Notable for the following:    Hemoglobin 12.4 (*)    HCT 37.7 (*)    All other components within normal limits  DIFFERENTIAL - Abnormal; Notable for the following:    Monocytes Relative 14 (*)    All other components within normal limits  COMPREHENSIVE METABOLIC PANEL - Abnormal; Notable for the following:    Glucose, Bld 209 (*)    BUN 25 (*)    Creatinine, Ser 1.70 (*)    Albumin 2.9 (*)    Alkaline Phosphatase 35 (*)    GFR calc non Af Amer 38 (*)    GFR calc Af Amer 44 (*)    All other components within normal limits  URINALYSIS, ROUTINE W REFLEX MICROSCOPIC - Abnormal; Notable for the following:    Hgb urine dipstick TRACE (*)    All other components within normal limits  PRO B NATRIURETIC PEPTIDE - Abnormal; Notable for the following:    Pro B Natriuretic peptide (BNP) 2499.0 (*)    All other components within normal limits  I-STAT CHEM 8, ED - Abnormal; Notable for the following:    BUN 25 (*)    Creatinine, Ser 1.80 (*)    Glucose, Bld 219 (*)    All other components within normal limits  ETHANOL  PROTIME-INR  APTT  URINE RAPID DRUG SCREEN (HOSP PERFORMED)  URINE MICROSCOPIC-ADD ON  VALPROIC ACID LEVEL  I-STAT TROPOININ, ED  I-STAT TROPOININ, ED    Imaging Review    EKG Interpretation None      MDM   Final diagnoses:  Stroke-like symptoms  Congestive heart failure, unspecified congestive heart failure chronicity, unspecified congestive heart failure type    Medications  nitroGLYCERIN (NITROGLYN) 2 % ointment 1 inch (1 inch Topical Given 08/25/14 1605)  albuterol (PROVENTIL) (2.5 MG/3ML) 0.083% nebulizer solution 2.5 mg (not administered)  furosemide (LASIX) injection 80 mg (80 mg Intravenous Given 08/25/14 1604)      Filed Vitals:   08/25/14 1530 08/25/14 1611 08/25/14 1615 08/25/14 1630  BP: 154/89  167/77 140/80  Pulse:   104 97  Temp:  98.5 F (36.9 C)    TempSrc:      Resp: 22  28 20   SpO2:   91% 91%   Patient presenting to the ED with stroke like symptoms once patient woke up this afternoon at approximately 12:30 PM. Patient has a normal yesterday, 08/24/2014, approximately 3:30 PM. Upon arrival to the emergency department, patient's symptoms have gotten progressively worse increase garbled speech with left-sided facial drooping and increased weakness to the left side. Patient does follow commands well.  EKG noted atrial fibrillation with a heart rate of 106 beats per minute. I-STAT troponin negative elevation. PT/INR within normal limits. Elevated BNP of 2499.0. CBC unremarkable. CMP noted BUN of 25, creatinine 1.70 - patient has history of chronic kidney disease. Negative anion gap. Ethanol level negative elevation. Chest x-ray noted bibasally or atelectasis, cannot exclude mild interstitial edema. CT head negative for acute intracranial abnormalities.  Patient does have history of atrial fibrillation, but is not on anticoagulation therapy secondary to alcoholism as well as medical noncompliance. Patient seen and assessed by neurology, Dr. Doy Mince recommends, MRI/MRA of brain and for patient to be admitted to the hospital under Internal Medicine care. Patient is out of  window of TPA. Discussed case with Dr. Candiss Norse - patient to be admitted for stroke work-up to be performed. Patient will need OT/PT. Patient stable for transfer.   Jamse Mead, PA-C 08/25/14 1819

## 2014-08-25 NOTE — Consult Note (Signed)
Referring Physician: Jeneen Rinks    Chief Complaint: Slurred speech and left sided weakness  HPI: [name] is an 78 y.o. male who reports going to bed at baseline last evening. He awakened at about 0200 and noted no focal problems.  He awakened again at 1230 and noted left sided weakness and slurred speech.  EMS was called at that time and the patient was brought in as a code stroke.   Initial NIHSS of 6.    Date last known well: Date: 08/25/2014 Time last known well: Time: 02:00 tPA Given: No: Outside time window  Past Medical History  Diagnosis Date  . Hyperlipidemia   . HTN (hypertension)     echo 2/11: EF 50-55%, diast dyfxn, severe LVH, inf HK, LAE  . CAD (coronary artery disease)     a.  NSTEMI treated with PCI Feb 2011 with a DES.([facility]);   b. cath 2/11: D1 stents x 2 ok, AV groove CFX occluded with dist AV CFX filled by L-L collats, RCA occluded, OM2 90-95% (treated with PCI)  . MI (myocardial infarction)   . PVD (peripheral vascular disease)   . DM (diabetes mellitus)   . GERD (gastroesophageal reflux disease)   . Bipolar disorder   . Prostate cancer     status post radiation therapy in 2003  . Pulmonary edema   . Atrial fibrillation   . Respiratory difficulty 06/24/2014    intubated   . Hypertensive emergency 06/24/2014  . CKD (chronic kidney disease), stage III     Past Surgical History  Procedure Laterality Date  . Femoral bypass  2003    Family History  Problem Relation Age of Onset  . Cancer Mother   . Cancer Father   . Cancer Sister    Social History:  reports that he quit smoking about 8 weeks ago. His smoking use included Cigarettes. He has a 25 pack-year smoking history. He does not have any smokeless tobacco history on file. He reports that he drinks alcohol. He reports that he does not use illicit drugs.  Allergies:  Allergies  Allergen Reactions  . Penicillins Other (See Comments)     hands and feet edema    Medications: I have reviewed the  patient's current medications. Prior to Admission:  Current outpatient prescriptions: albuterol (PROVENTIL HFA;VENTOLIN HFA) 108 (90 BASE) MCG/ACT inhaler, Inhale 2 puffs into the lungs every 6 (six) hours as needed for shortness of breath., Disp: , Rfl: ;   aspirin 325 MG EC tablet, Take 325 mg by mouth daily. , Disp: , Rfl: ;   atorvastatin (LIPITOR) 40 MG tablet, Take 1 tablet (40 mg total) by mouth daily., Disp: 90 tablet, Rfl: 3 carvedilol (COREG) 12.5 MG tablet, Take 12.5 mg by mouth 2 (two) times daily., Disp: , Rfl: ;   cilostazol (PLETAL) 50 MG tablet, Take 50 mg by mouth 2 (two) times daily., Disp: , Rfl: ;   divalproex (DEPAKOTE) 250 MG EC tablet, Take 500 mg by mouth 2 (two) times daily. , Disp: , Rfl: ;   finasteride (PROSCAR) 5 MG tablet, Take 5 mg by mouth daily.  , Disp: , Rfl:  furosemide (LASIX) 40 MG tablet, Take 40 mg by mouth 2 (two) times daily. , Disp: , Rfl: ;   gabapentin (NEURONTIN) 300 MG capsule, Take 300 mg by mouth 3 (three) times daily., Disp: , Rfl: ;   insulin glargine (LANTUS) 100 UNIT/ML injection, Inject 40 Units into the skin daily., Disp: , Rfl: ;  insulin regular (HUMULIN R,NOVOLIN R) 100 units/mL injection, Inject 8 Units into the skin 2 (two) times daily before a meal. , Disp: , Rfl:  metoprolol (LOPRESSOR) 50 MG tablet, Take 1 tablet (50 mg total) by mouth 2 (two) times daily., Disp: 60 tablet, Rfl: 11;   pantoprazole (PROTONIX) 20 MG tablet, Take 20 mg by mouth daily., Disp: , Rfl: ;   sildenafil (VIAGRA) 100 MG tablet, Take 100 mg by mouth daily as needed for erectile dysfunction., Disp: , Rfl: ;   spironolactone (ALDACTONE) 25 MG tablet, Take 25 mg by mouth daily., Disp: , Rfl:  Tamsulosin HCl (FLOMAX) 0.4 MG CAPS, Take 0.4 mg by mouth daily.  , Disp: , Rfl: ;   travoprost, benzalkonium, (TRAVATAN) 0.004 % ophthalmic solution, Place 1 drop into both eyes at bedtime.  , Disp: , Rfl:   ROS: History obtained from the patient  General ROS: negative  for - chills, fatigue, fever, night sweats, weight gain or weight loss Psychological ROS: negative for - behavioral disorder, hallucinations, memory difficulties, mood swings or suicidal ideation Ophthalmic ROS: negative for - blurry vision, double vision, eye pain or loss of vision ENT ROS: negative for - epistaxis, nasal discharge, oral lesions, sore throat, tinnitus or vertigo Allergy and Immunology ROS: negative for - hives or itchy/watery eyes Hematological and Lymphatic ROS: negative for - bleeding problems, bruising or swollen lymph nodes Endocrine ROS: negative for - galactorrhea, hair pattern changes, polydipsia/polyuria or temperature intolerance Respiratory ROS: shortness of breath  Cardiovascular ROS: BLE edema Gastrointestinal ROS: negative for - abdominal pain, diarrhea, hematemesis, nausea/vomiting or stool incontinence Genito-Urinary ROS: negative for - dysuria, hematuria, incontinence or urinary frequency/urgency Musculoskeletal ROS: negative for - joint swelling or muscular weakness Neurological ROS: as noted in HPI Dermatological ROS: negative for rash and skin lesion changes  Physical Examination: Blood pressure 165/85, pulse 105, temperature 98.5 F (36.9 C), temperature source Oral, resp. rate 26, SpO2 95.00%.  Neurologic Examination: Mental Status: Alert, oriented, thought content appropriate.  Speech fluent without evidence of aphasia.  Marked dysarthria.  Able to follow 3 step commands without difficulty. Cranial Nerves: II: Discs flat bilaterally; Visual fields grossly normal, pupils equal, round, reactive to light and accommodation III,IV, VI: ptosis not present, extra-ocular motions intact bilaterally V,VII: left facial droop, facial light touch sensation normal bilaterally VIII: hearing normal bilaterally IX,X: gag reflex reduced XI: bilateral shoulder shrug XII: midline tongue extension Motor: Right : Upper extremity   5/5    Left:     Upper extremity    4/5  Lower extremity   5/5     Lower extremity   5/5 Tone and bulk:normal tone throughout; no atrophy noted Sensory: Pinprick and light touch intact throughout, bilaterally Deep Tendon Reflexes: 2+ and symmetric in the upper extremities, 1+ at the knees and absent at the ankles Plantars: Right: upgoing   Left: upgoing Cerebellar: normal finger-to-nose bilaterally and dysmetria with heel-to-shin testing on the left Gait: unable to test due to safety concerns CV: pulses palpable throughout     Laboratory Studies:  Basic Metabolic Panel:  Recent Labs Lab 08/25/14 1444  NA 140  K 3.8  CL 96  GLUCOSE 219*  BUN 25*  CREATININE 1.80*    Liver Function Tests: No results found for this basename: AST, ALT, ALKPHOS, BILITOT, PROT, ALBUMIN,  in the last 168 hours No results found for this basename: LIPASE, AMYLASE,  in the last 168 hours No results found for this basename: AMMONIA,  in the last  168 hours  CBC:  Recent Labs Lab 08/25/14 1444  HGB 14.6  HCT 43.0    Cardiac Enzymes: No results found for this basename: CKTOTAL, CKMB, CKMBINDEX, TROPONINI,  in the last 168 hours  BNP: No components found with this basename: POCBNP,   CBG: No results found for this basename: GLUCAP,  in the last 168 hours  Microbiology: Results for orders placed during the hospital encounter of 06/24/14  MRSA PCR SCREENING     Status: None   Collection Time    06/24/14  2:18 PM      Result Value Ref Range Status   MRSA by PCR NEGATIVE  NEGATIVE Final   Comment:            The GeneXpert MRSA Assay (FDA     approved for NASAL specimens     only), is one component of a     comprehensive MRSA colonization     surveillance program. It is not     intended to diagnose MRSA     infection nor to guide or     monitor treatment for     MRSA infections.  CULTURE, BLOOD (ROUTINE X 2)     Status: None   Collection Time    06/24/14  3:17 PM      Result Value Ref Range Status   Specimen  Description BLOOD LEFT ARM   Final   Special Requests BOTTLES DRAWN AEROBIC AND ANAEROBIC 10CC   Final   Culture  Setup Time     Final   Value: 06/24/2014 22:50     Performed at [facility]   Culture     Final   Value: NO GROWTH 5 DAYS     Performed at [facility]   Report Status 06/30/2014 FINAL   Final  CULTURE, RESPIRATORY (NON-EXPECTORATED)     Status: None   Collection Time    06/24/14  3:35 PM      Result Value Ref Range Status   Specimen Description TRACHEAL ASPIRATE   Final   Special Requests Normal   Final   Gram Stain     Final   Value: RARE WBC PRESENT, PREDOMINANTLY MONONUCLEAR     NO SQUAMOUS EPITHELIAL CELLS SEEN     MODERATE GRAM POSITIVE COCCI IN PAIRS     Performed at [facility]   Culture     Final   Value: Non-Pathogenic Oropharyngeal-type Flora Isolated.     Performed at [facility]   Report Status 06/27/2014 FINAL   Final  RESPIRATORY VIRUS PANEL     Status: None   Collection Time    06/24/14  3:35 PM      Result Value Ref Range Status   Source - RVPAN TRACHEAL ASPIRATE   Final   Respiratory Syncytial Virus A NOT DETECTED   Final   Respiratory Syncytial Virus B NOT DETECTED   Final   Influenza A NOT DETECTED   Final   Influenza B NOT DETECTED   Final   Parainfluenza 1 NOT DETECTED   Final   Parainfluenza 2 NOT DETECTED   Final   Parainfluenza 3 NOT DETECTED   Final   Metapneumovirus NOT DETECTED   Final   Rhinovirus NOT DETECTED   Final   Adenovirus NOT DETECTED   Final   Influenza A H1 NOT DETECTED   Final   Influenza A H3 NOT DETECTED   Final   Comment: (NOTE)  Normal Reference Range for each Analyte: NOT DETECTED     Testing performed using the Luminex xTAG Respiratory Viral Panel test     kit.     This test was developed and its performance characteristics determined     by [facility]. It has not been cleared or approved by the [place]     Food and Drug Administration. This test is used for  clinical purposes.     It should not be regarded as investigational or for research. This     laboratory is certified under the [place] (CLIA) as qualified to perform high complexity     clinical laboratory testing.     Performed at [facility]     Status: None   Collection Time    06/24/14  5:16 PM      Result Value Ref Range Status   Specimen Description URINE, CATHETERIZED   Final   Special Requests Immunocompromised   Final   Culture  Setup Time     Final   Value: 06/25/2014 02:14     Performed at [facility]     Final   Value: NO GROWTH     Performed at [facility]   Culture     Final   Value: NO GROWTH     Performed at [facility]   Report Status 06/26/2014 FINAL   Final  CULTURE, BLOOD (ROUTINE X 2)     Status: None   Collection Time    06/24/14  5:31 PM      Result Value Ref Range Status   Specimen Description BLOOD RIGHT HAND   Final   Special Requests BOTTLES DRAWN AEROBIC ONLY Protection   Final   Culture  Setup Time     Final   Value: 06/25/2014 01:49     Performed at [facility]   Culture     Final   Value: NO GROWTH 5 DAYS     Performed at [facility]   Report Status 07/01/2014 FINAL   Final    Coagulation Studies: No results found for this basename: LABPROT, INR,  in the last 72 hours  Urinalysis: No results found for this basename: COLORURINE, APPERANCEUR, LABSPEC, PHURINE, GLUCOSEU, HGBUR, BILIRUBINUR, KETONESUR, PROTEINUR, UROBILINOGEN, NITRITE, LEUKOCYTESUR,  in the last 168 hours  Lipid Panel:    Component Value Date/Time   CHOL  Value: 128        ATP III CLASSIFICATION:  <200     mg/dL   Desirable  200-239  mg/dL   Borderline High  >=240    mg/dL   High        02/19/2010 0045   TRIG 82 02/19/2010 0045   HDL 57 02/19/2010 0045   CHOLHDL 2.2 02/19/2010 0045   VLDL 16 02/19/2010 0045   LDLCALC  Value: 55        Total  Cholesterol/HDL:CHD Risk Coronary Heart Disease Risk Table                     Men   Women  1/2 Average Risk   3.4   3.3  Average Risk       5.0   4.4  2 X Average Risk   9.6   7.1  3 X Average Risk  23.4   11.0        Use the calculated  Patient Ratio above and the CHD Risk Table to determine the patient's CHD Risk.        ATP III CLASSIFICATION (LDL):  <100     mg/dL   Optimal  100-129  mg/dL   Near or Above                    Optimal  130-159  mg/dL   Borderline  160-189  mg/dL   High  >190     mg/dL   Very High 02/19/2010 0045    HgbA1C:  No results found for this basename: HGBA1C    Urine Drug Screen:     Component Value Date/Time   LABOPIA NONE DETECTED 06/24/2014 1534   COCAINSCRNUR NONE DETECTED 06/24/2014 1534   LABBENZ NONE DETECTED 06/24/2014 1534   AMPHETMU NONE DETECTED 06/24/2014 1534   THCU POSITIVE* 06/24/2014 1534   LABBARB NONE DETECTED 06/24/2014 1534    Alcohol Level: No results found for this basename: ETH,  in the last 168 hours  Other results: EKG: atrial fibrillation, rate 106 bpm.  Imaging: Ct Head Wo Contrast  08/25/2014   CLINICAL DATA:  Slurred speech.  Altered mental status.  EXAM: CT HEAD WITHOUT CONTRAST  TECHNIQUE: Contiguous axial images were obtained from the base of the skull through the vertex without intravenous contrast.  COMPARISON:  None.  FINDINGS: There is atrophy and chronic small vessel disease changes. No acute intracranial abnormality. Specifically, no hemorrhage, hydrocephalus, mass lesion, acute infarction, or significant intracranial injury. No acute calvarial abnormality. Visualized paranasal sinuses and mastoids clear. Orbital soft tissues unremarkable.  IMPRESSION: No acute intracranial abnormality.  Atrophy, chronic microvascular disease.  Critical Value/emergent results were called by telephone at the time of interpretation on 08/25/2014 at 2:59 pm to Dr. Doy Mince, who verbally acknowledged these results.   Electronically Signed   By: Rolm Baptise  M.D.   On: 08/25/2014 14:58   Dg Chest Port 1 View  08/25/2014   CLINICAL DATA:  Stroke  EXAM: PORTABLE CHEST - 1 VIEW  COMPARISON:  07/16/2014  FINDINGS: Heart is borderline in size. Mild interstitial prominence again noted, similar to prior study. Cannot exclude interstitial edema. Bibasilar opacities likely reflect scarring and/or atelectasis, similar to prior study. No effusions. No acute bony abnormality.  IMPRESSION: Bibasilar atelectasis or scarring.  Cannot exclude mild interstitial edema.   Electronically Signed   By: Rolm Baptise M.D.   On: 08/25/2014 15:10    Assessment: 78 y.o. male presenting with slurred speech and left sided weakness.  Has multiple stroke risk factors including atrial fibrillation.  On ASA at home.  Head CT reviewed and shows no acute changes.  Patient outside the time window for tPA and intervention.  Further work up recommended.  Stroke Risk Factors - atrial fibrillation, diabetes mellitus, hyperlipidemia, hypertension and smoking  Plan: 1. HgbA1c, fasting lipid panel 2. MRI, MRA  of the brain without contrast 3. PT consult, OT consult, Speech consult 4. Echocardiogram 5. Carotid dopplers 6. Prophylactic therapy-Antiplatelet med: Aspirin - dose 3107m 7. Risk factor modification 8. Telemetry monitoring 9. Frequent neuro checks  Case discussed with Dr. JSallyanne Kuster MD [facility] [phone]/29/2015, 3:18 PM

## 2014-08-26 ENCOUNTER — Inpatient Hospital Stay (HOSPITAL_COMMUNITY): Payer: PRIVATE HEALTH INSURANCE

## 2014-08-26 DIAGNOSIS — I6340 Cerebral infarction due to embolism of unspecified cerebral artery: Principal | ICD-10-CM

## 2014-08-26 LAB — GLUCOSE, CAPILLARY
GLUCOSE-CAPILLARY: 115 mg/dL — AB (ref 70–99)
Glucose-Capillary: 224 mg/dL — ABNORMAL HIGH (ref 70–99)
Glucose-Capillary: 113 mg/dL — ABNORMAL HIGH (ref 70–99)
Glucose-Capillary: 387 mg/dL — ABNORMAL HIGH (ref 70–99)
Glucose-Capillary: 195 mg/dL — ABNORMAL HIGH (ref 70–99)
Glucose-Capillary: 219 mg/dL — ABNORMAL HIGH (ref 70–99)

## 2014-08-26 LAB — LIPID PANEL
Cholesterol: 156 mg/dL (ref 0–200)
HDL: 63 mg/dL (ref >39)
LDL CALC: 69 mg/dL (ref 0–99)
Total CHOL/HDL Ratio: 2.5 RATIO
Triglycerides: 121 mg/dL (ref <150)
VLDL: 24 mg/dL (ref 0–40)

## 2014-08-26 LAB — BASIC METABOLIC PANEL
ANION GAP: 11 (ref 5–15)
BUN: 24 mg/dL — ABNORMAL HIGH (ref 6–23)
CALCIUM: 9.8 mg/dL (ref 8.4–10.5)
CO2: 36 mEq/L — ABNORMAL HIGH (ref 19–32)
Chloride: 97 mEq/L (ref 96–112)
Creatinine, Ser: 1.68 mg/dL — ABNORMAL HIGH (ref 0.50–1.35)
GFR calc Af Amer: 44 mL/min — ABNORMAL LOW (ref >90)
GFR, EST NON AFRICAN AMERICAN: 38 mL/min — AB (ref >90)
Glucose, Bld: 159 mg/dL — ABNORMAL HIGH (ref 70–99)
POTASSIUM: 3.6 meq/L — AB (ref 3.7–5.3)
Sodium: 144 mEq/L (ref 137–147)

## 2014-08-26 LAB — CBC
HCT: 40.9 % (ref 39.0–52.0)
Hemoglobin: 13.3 g/dL (ref 13.0–17.0)
MCH: 29.5 pg (ref 26.0–34.0)
MCHC: 32.5 g/dL (ref 30.0–36.0)
MCV: 90.7 fL (ref 78.0–100.0)
Platelets: 300 10*3/uL (ref 150–400)
RBC: 4.51 MIL/uL (ref 4.22–5.81)
RDW: 15.8 % — AB (ref 11.5–15.5)
WBC: 6.5 10*3/uL (ref 4.0–10.5)

## 2014-08-26 LAB — HEMOGLOBIN A1C
Hgb A1c MFr Bld: 8.4 % — ABNORMAL HIGH (ref <5.7)
Mean Plasma Glucose: 194 mg/dL — ABNORMAL HIGH (ref <117)

## 2014-08-26 MED ORDER — INSULIN ASPART 100 UNIT/ML ~~LOC~~ SOLN: 0.0000 [IU] | Freq: Three times a day (TID) | SUBCUTANEOUS | Status: DC

## 2014-08-26 MED ORDER — INSULIN ASPART 100 UNIT/ML ~~LOC~~ SOLN
0.0000 [IU] | Freq: Every day | SUBCUTANEOUS | Status: DC
  Administered 2014-08-26: 2 [IU] via SUBCUTANEOUS

## 2014-08-26 MED ORDER — GABAPENTIN 300 MG PO CAPS
300.0000 mg | ORAL_CAPSULE | Freq: Two times a day (BID) | ORAL | Status: DC
  Administered 2014-08-26 – 2014-08-28 (×4): 300 mg via ORAL
  Filled 2014-08-26 (×4): qty 1

## 2014-08-26 MED ORDER — CLOPIDOGREL BISULFATE 75 MG PO TABS
75.0000 mg | ORAL_TABLET | Freq: Every day | ORAL | Status: DC
  Administered 2014-08-26 – 2014-08-27 (×2): 75 mg via ORAL
  Filled 2014-08-26 (×2): qty 1

## 2014-08-26 MED ORDER — INSULIN GLARGINE 100 UNIT/ML ~~LOC~~ SOLN
20.0000 [IU] | Freq: Two times a day (BID) | SUBCUTANEOUS | Status: DC
  Administered 2014-08-26: 20 [IU] via SUBCUTANEOUS
  Filled 2014-08-26 (×4): qty 0.2

## 2014-08-26 MED ORDER — NITROGLYCERIN 0.4 MG SL SUBL: 0.4000 mg | SUBLINGUAL_TABLET | SUBLINGUAL | Status: DC | PRN

## 2014-08-26 MED ORDER — POTASSIUM CHLORIDE IN NACL 40-0.9 MEQ/L-% IV SOLN
INTRAVENOUS | Status: DC
  Administered 2014-08-26: 50 mL/h via INTRAVENOUS
  Filled 2014-08-26: qty 1000

## 2014-08-26 MED ORDER — INSULIN ASPART 100 UNIT/ML ~~LOC~~ SOLN
0.0000 [IU] | Freq: Three times a day (TID) | SUBCUTANEOUS | Status: DC
  Administered 2014-08-26: 3 [IU] via SUBCUTANEOUS
  Administered 2014-08-27: 2 [IU] via SUBCUTANEOUS
  Administered 2014-08-27: 3 [IU] via SUBCUTANEOUS
  Administered 2014-08-28: 5 [IU] via SUBCUTANEOUS

## 2014-08-26 MED ORDER — METOPROLOL TARTRATE 50 MG PO TABS
50.0000 mg | ORAL_TABLET | Freq: Two times a day (BID) | ORAL | Status: DC
  Administered 2014-08-26 – 2014-08-28 (×4): 50 mg via ORAL
  Filled 2014-08-26 (×6): qty 1

## 2014-08-26 MED ORDER — FUROSEMIDE 40 MG PO TABS
40.0000 mg | ORAL_TABLET | Freq: Two times a day (BID) | ORAL | Status: DC
  Administered 2014-08-26: 40 mg via ORAL
  Filled 2014-08-26 (×3): qty 1

## 2014-08-26 MED ORDER — POTASSIUM CHLORIDE CRYS ER 20 MEQ PO TBCR
40.0000 meq | EXTENDED_RELEASE_TABLET | Freq: Once | ORAL | Status: AC
  Administered 2014-08-26: 40 meq via ORAL
  Filled 2014-08-26: qty 2

## 2014-08-26 NOTE — Evaluation (Addendum)
Physical Therapy Evaluation Patient Details Name: [name] MRN: [id number] DOB: April 11, 1936 Today's Date: 08/26/2014   History of Present Illness  78 y.o. male admitted with dysarthric speech and left sided weakness. Has multiple stroke risk factors including atrial fibrillation.  Head CT with no acute changes. 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   Clinical Impression  Pt adm due to above. Pt from independent living facility. Pt has aide 7 days/week for 2.5 hours for ADLs. Pt reports he wears adult briefs at night to prevent falls. Pt to benefit from HHPT to address deficits and maximize functional mobility prior to returning home. Will need to be near mod I from mobility standpoint to return to independent living. Recommend HHPT resume upon D/C back to facility.     Follow Up Recommendations Home health PT;Supervision - Intermittent    Equipment Recommendations  None recommended by PT    Recommendations for Other Services OT consult     Precautions / Restrictions Precautions Precautions: Fall Restrictions Weight Bearing Restrictions: No      Mobility  Bed Mobility Overal bed mobility: Modified Independent             General bed mobility comments: use of handrails with HOB elevated   Transfers Overall transfer level: Needs assistance Equipment used: Rolling walker (2 wheeled) Transfers: Sit to/from Stand Sit to Stand: Supervision         General transfer comment: supervision for safety and cues for hand placement and RW positioning   Ambulation/Gait Ambulation/Gait assistance: Min guard Ambulation Distance (Feet): 120 Feet Assistive device: Rolling walker (2 wheeled) Gait Pattern/deviations: Step-through pattern;Trunk flexed;Wide base of support Gait velocity: impulsively quick; cues for safe speed   General Gait Details: pt required min guard for RW safety; tactile cueing for upright posture  Stairs             Wheelchair Mobility    Modified Rankin (Stroke Patients Only) Modified Rankin (Stroke Patients Only) Pre-Morbid Rankin Score: Moderately severe disability Modified Rankin: Moderately severe disability     Balance Overall balance assessment: Needs assistance Sitting-balance support: Feet supported;No upper extremity supported Sitting balance-Leahy Scale: Good Sitting balance - Comments: denied any dizziness at EOB   Standing balance support: During functional activity;Bilateral upper extremity supported Standing balance-Leahy Scale: Poor Standing balance comment: relies on RW for UE support                             Pertinent Vitals/Pain Pain Assessment: No/denies pain    Home Living Family/patient expects to be discharged to:: Other (Comment)     Type of Home: Independent living facility           Additional Comments: [facility] living    Prior Function Level of Independence: Independent with assistive device(s)         Comments: pt has aide 7 days a week for 2.5 hours per day.     Hand Dominance   Dominant Hand: Right    Extremity/Trunk Assessment   Upper Extremity Assessment: Defer to OT evaluation           Lower Extremity Assessment: Overall WFL for tasks assessed      Cervical / Trunk Assessment: Kyphotic  Communication   Communication: Other (comment) (dysarthric at times )  Cognition Arousal/Alertness: Awake/alert Behavior During Therapy: WFL for tasks assessed/performed Overall Cognitive Status: Impaired/Different from baseline Area of Impairment: Orientation;Attention;Memory;Safety/judgement Orientation Level:  Disoriented to;Time Current Attention Level: Selective Memory: Decreased short-term memory   Safety/Judgement: Decreased awareness of deficits;Decreased awareness of safety     General Comments: "i dont know what year it is. i dont care" ; pt does report he handles his own  meds    General Comments General comments (skin integrity, edema, etc.): ambulated on RA; O2 > 90% throughout session    Exercises        Assessment/Plan    PT Assessment Patient needs continued PT services  PT Diagnosis Abnormality of gait   PT Problem List Decreased activity tolerance;Decreased mobility;Decreased balance;Decreased knowledge of use of DME;Decreased safety awareness  PT Treatment Interventions DME instruction;Gait training;Functional mobility training;Therapeutic activities;Therapeutic exercise;Balance training;Neuromuscular re-education;Patient/family education   PT Goals (Current goals can be found in the Care Plan section) Acute Rehab PT Goals Patient Stated Goal: to go back to my apartment  PT Goal Formulation: With patient Time For Goal Achievement: 09/01/14 Potential to Achieve Goals: Good    Frequency Min 4X/week   Barriers to discharge Decreased caregiver support does not have 24/7 (A); will need to be mod I    Co-evaluation               End of Session Equipment Utilized During Treatment: Gait belt Activity Tolerance: Patient tolerated treatment well Patient left: in bed;with call bell/phone within reach;with nursing/sitter in room Nurse Communication: Mobility status         Time: 1032-1050 PT Time Calculation (min): 18 min   Charges:   PT Evaluation $Initial PT Evaluation Tier I: 1 Procedure PT Treatments $Gait Training: 8-22 mins   PT G CodesGustavus Bryant, [place]  [phone] 08/26/2014, 12:09 PM

## 2014-08-26 NOTE — Evaluation (Signed)
Clinical/Bedside Swallow Evaluation Patient Details  Name: [name] MRN: [id number] Date of Birth: July 30, 1936  Today's Date: 08/26/2014 Time: 7915-0569 SLP Time Calculation (min): 41 min  Past Medical History:  Past Medical History  Diagnosis Date  . Hyperlipidemia   . HTN (hypertension)     echo 2/11: EF 50-55%, diast dyfxn, severe LVH, inf HK, LAE  . CAD (coronary artery disease)     a.  NSTEMI treated with PCI Feb 2011 with a DES.([facility]);   b. cath 2/11: D1 stents x 2 ok, AV groove CFX occluded with dist AV CFX filled by L-L collats, RCA occluded, OM2 90-95% (treated with PCI)  . MI (myocardial infarction)   . PVD (peripheral vascular disease)   . DM (diabetes mellitus)   . GERD (gastroesophageal reflux disease)   . Bipolar disorder   . Prostate cancer     status post radiation therapy in 2003  . Pulmonary edema   . Atrial fibrillation   . Respiratory difficulty 06/24/2014    intubated   . Hypertensive emergency 06/24/2014  . CKD (chronic kidney disease), stage III    Past Surgical History:  Past Surgical History  Procedure Laterality Date  . Femoral bypass  2003   HPI:  78 y.o. male admitted with dysarthric speech and left sided weakness. Has multiple stroke risk factors including atrial fibrillation.  Head CT with no acute changes. Failed stroke swallow screen in ED.   Assessment / Plan / Recommendation Clinical Impression  Pt presents with focal CN V, VII deficits on left.  CN X involvement notable with hypernasal resonance.  Pt presents with multiple clinical s/s of dysphagia with aspiration risk marked by wet phonation post swallow, significant coughing after consumption of all consistencies.  Min verbal cues provided to trial postural changes, strategies to enhance safety/toleration at bedside, but none were effective.  Recommend proceeding with MBS for more definitive diagnosis.  Pt agrees.      Aspiration Risk  Moderate    Diet Recommendation NPO    Medication Administration: Whole meds with puree Supervision: Patient able to self feed    Other  Recommendations Recommended Consults: MBS Oral Care Recommendations: Oral care Q4 per protocol   Follow Up Recommendations   (tba)        Swallow Study Prior Functional Status       General Date of Onset: 08/25/14 HPI: 78 y.o. male admitted with dysarthric speech and left sided weakness. Has multiple stroke risk factors including atrial fibrillation.  Head CT with no acute changes. Failed stroke swallow screen in ED. Type of Study: Bedside swallow evaluation Previous Swallow Assessment: pt reports swallow study at [facility] four years ago Diet Prior to this Study: NPO Temperature Spikes Noted: No Respiratory Status: Room air History of Recent Intubation: No Behavior/Cognition: Alert;Cooperative;Pleasant mood Oral Cavity - Dentition: Dentures, bottom;Dentures, top Self-Feeding Abilities: Able to feed self Patient Positioning: Upright in bed Baseline Vocal Quality: Clear Volitional Cough: Strong Volitional Swallow: Able to elicit    Oral/Motor/Sensory Function Overall Oral Motor/Sensory Function: Impaired (mild asymmetry CN V, VII on left)   Ice Chips Ice chips: Within functional limits   Thin Liquid Thin Liquid: Impaired Presentation: Cup;Self Fed;Straw Pharyngeal  Phase Impairments: Throat Clearing - Immediate;Multiple swallows;Wet Vocal Quality;Cough - Immediate;Cough - Delayed (<10% of time)    Nectar Thick Nectar Thick Liquid: Impaired Presentation: Cup;Self Fed Pharyngeal Phase Impairments: Multiple swallows;Wet Vocal Quality;Throat Clearing - Delayed;Cough - Immediate   Honey Thick Honey Thick Liquid: Not tested  Puree Puree: Impaired Presentation: Self Fed;Spoon Pharyngeal Phase Impairments: Wet Vocal Quality;Multiple swallows   Solid  Hezzie Karim L. [place], [place] CCC/SLP Pager [phone]     Solid: Impaired Presentation: Self Fed Pharyngeal Phase Impairments: Multiple  swallows;Cough - Delayed       [name] 08/26/2014,10:26 AM

## 2014-08-26 NOTE — Progress Notes (Signed)
Report called to [place] on 5W.

## 2014-08-26 NOTE — Progress Notes (Deleted)
08/26/14 Patient coming from 3 S to room 5W 12 with Dx of CVA, Left side weakness,,Hx of HTN,CKD stg 2,Chronic Diastolic CHF with EF of 35%,TIR,WE of Smoking, ETOH,Obesity,Bipolar, and Prostate CA.IV site 8/30 with NS with 40 K+ at 50cc/hr,SCD's,Diet Dys 3 with thin liquids,Daily wgts,Strict I & O's,CBG's,

## 2014-08-26 NOTE — Progress Notes (Signed)
Attempted to call report

## 2014-08-26 NOTE — Progress Notes (Signed)
Report given to Tammy RN on 4N.

## 2014-08-26 NOTE — Progress Notes (Signed)
STROKE TEAM PROGRESS NOTE   HISTORY [name] is an 78 y.o. male who reports going to bed at baseline last evening. He awakened at about 0200 and noted no focal problems. He awakened again at 1230 and noted left sided weakness and slurred speech. EMS was called at that time and the patient was brought in as a code stroke. Initial NIHSS of 6.   Date last known well: Date: 08/25/2014  Time last known well: Time: 02:00  tPA Given: No: Outside time window   SUBJECTIVE (INTERVAL HISTORY) No new complaints are reported. He reports that he is feeling fairly well. The chart shows a history of falls or gait imbalance but he denies this.   OBJECTIVE Temp:  [97.9 F (36.6 C)-98.5 F (36.9 C)] 98.2 F (36.8 C) (08/30 0700) Pulse Rate:  [92-179] 92 (08/30 0440) Cardiac Rhythm:  [-] Atrial fibrillation (08/30 0440) Resp:  [15-28] 18 (08/30 0440) BP: (119-167)/(70-92) 119/70 mmHg (08/30 0440) SpO2:  [91 %-96 %] 92 % (08/30 0440) Weight:  [215 lb 2.7 oz (97.6 kg)-216 lb 4.3 oz (98.1 kg)] 215 lb 2.7 oz (97.6 kg) (08/30 0300)   Recent Labs Lab 08/25/14 1946 08/25/14 2349 08/26/14 0437  GLUCAP 244* 224* 113*    Recent Labs Lab 08/25/14 1430 08/25/14 1444 08/26/14 0250  NA 139 140 144  K 3.8 3.8 3.6*  CL 96 96 97  CO2 30  --  36*  GLUCOSE 209* 219* 159*  BUN 25* 25* 24*  CREATININE 1.70* 1.80* 1.68*  CALCIUM 8.9  --  9.8    Recent Labs Lab 08/25/14 1430  AST 14  ALT 5  ALKPHOS 35*  BILITOT 0.5  PROT 7.3  ALBUMIN 2.9*    Recent Labs Lab 08/25/14 1430 08/25/14 1444 08/26/14 0250  WBC 7.3  --  6.5  NEUTROABS 3.2  --   --   HGB 12.4* 14.6 13.3  HCT 37.7* 43.0 40.9  MCV 88.5  --  90.7  PLT 280  --  300   No results found for this basename: CKTOTAL, CKMB, CKMBINDEX, TROPONINI,  in the last 168 hours  Recent Labs  08/25/14 1430  LABPROT 14.0  INR 1.08    Recent Labs  08/25/14 1602  COLORURINE YELLOW  LABSPEC 1.008  PHURINE 6.5  GLUCOSEU NEGATIVE   HGBUR TRACE*  BILIRUBINUR NEGATIVE  KETONESUR NEGATIVE  PROTEINUR NEGATIVE  UROBILINOGEN 1.0  NITRITE NEGATIVE  LEUKOCYTESUR NEGATIVE       Component Value Date/Time   CHOL 156 08/26/2014 0250   TRIG 121 08/26/2014 0250   HDL 63 08/26/2014 0250   CHOLHDL 2.5 08/26/2014 0250   VLDL 24 08/26/2014 0250   LDLCALC 69 08/26/2014 0250   No results found for this basename: HGBA1C      Component Value Date/Time   LABOPIA NONE DETECTED 08/25/2014 1602   COCAINSCRNUR NONE DETECTED 08/25/2014 1602   LABBENZ NONE DETECTED 08/25/2014 1602   AMPHETMU NONE DETECTED 08/25/2014 1602   THCU NONE DETECTED 08/25/2014 1602   LABBARB NONE DETECTED 08/25/2014 1602     Recent Labs Lab 08/25/14 1430  ETH <11    Dg Chest 2 View 08/25/2014    1. Mild bibasilar atelectasis is stable. 2. Mild pulmonary vascular congestion.      Ct Head Wo Contrast 08/25/2014    No acute intracranial abnormality.  Atrophy, chronic microvascular disease.     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  No proximal vascular occlusion or parenchymal hemorrhage.  Global atrophy with chronic microvascular ischemic change.   Intracranial atherosclerotic disease affecting the carotid siphon regions and distal MCA and PCA branches, with no proximal flow reducing lesion.  No MCA branch occlusion.      Dg Chest Port 1 View 08/25/2014    Bibasilar atelectasis or scarring.  Cannot exclude mild interstitial edema.       PHYSICAL EXAM  GENERAL: He is in no acute distress.  HEENT: Supple. Atraumatic normocephalic.   ABDOMEN: soft  EXTREMITIES: No edema   BACK: Normal.  SKIN: Normal by inspection.    MENTAL STATUS: Alert and oriented. Speech, language and cognition are generally intact. Judgment and insight fair.   CRANIAL NERVES: Pupils are equal, round and reactive to light and accommodation; extra ocular movements are full, there is no significant  nystagmus; visual fields are full; upper and lower facial muscles are normal in strength and symmetric, there is no flattening of the nasolabial folds; tongue is midline; uvula is midline; shoulder elevation is normal.  MOTOR: Normal tone, bulk and strength; no pronator drift.  COORDINATION: Left finger to nose is normal, right finger to nose is normal, No rest tremor; no intention tremor; no postural tremor; no bradykinesia.     ASSESSMENT/PLAN  [name] is a 78 y.o. male with hx of hyperlipidemia, hypertension, coronary artery disease, peripheral vascular disease, diabetes mellitus, bipolar disorder, pulmonary edema, atrial fibrillation, and chronic kidney disease presenting with left hemiparesis and dysarthria. He did not receive IV t-PA secondary to late presentation. Imaging confirms an acute RIGHT posterior frontal cortical and subcortical infarction. Stroke work up underway.  Stroke - acute RIGHT posterior frontal cortical and subcortical infarction felt secondary to Atrial fibrillation.  aspirin 325 mg orally every day prior to admission, now on aspirin 325 mg orally every day. Plavix will be added. I think we ought to revisit the issue of long-term anticoagulation If he is willing to be compliant. This be deferred to his primary and cardiologist in the outpatient setting.  MRI - Acute RIGHT posterior frontal cortical and subcortical infarction.  MRA - Intracranial atherosclerotic disease affecting the carotid siphon regions and distal MCA and PCA branches, with no proximal flow reducing lesion. No MCA branch occlusion.    2D Echo  pending  Carotid pending  LDL 69 - on Lipitor prior to admission  HgbA1c - pending  Subcutaneous heparin for VTE prophylaxis  NPO no liquids.   Up with assistance.   Therapy needs:  Pending  Risk factor management/education  Patient counseled to be compliant with his antithrombotic medications  Disposition:   Pending  Hypertension   Home meds:  Lopressor and Aldactone. Resume in hospital when swallowing has been cleared.  BP (119-167)/(70-92) 119/70 mmHg (08/30 0440)  past 24h  SBP goal < 180  Stable  Patient counseled to be compliant with his blood pressure medications  Hyperlipidemia  Home meds:  Lipitor 40 mg daily - resumed in hospital.  LDL - 69 - on Lipitor prior to admission  LDL goal < 100 (<70 for diabetics)  Diabetes  Home meds:  Lantus insulin  HgbA1c - pending  Controlled / Uncontrolled  Goal < 7.0  Educated patient about lifestyle changes for diabetes prevention  Other Stroke Risk Factors Advanced age ETOH use   Obesity, Body mass index is 29.18 kg/(m^2).    Coronary artery disease  Other Active Problems  Hypokalemia - check bmet in the a.m.  Renal insufficiency  Other Pertinent History    Hospital day # 1  Mikey Bussing [facility] Pager [phone] 08/26/2014, 10:32 AM     To contact Stroke Continuity provider, please refer to [email or web address] After hours, contact General Neurology

## 2014-08-26 NOTE — Progress Notes (Signed)
Pt transferred to 4N11.

## 2014-08-26 NOTE — Progress Notes (Signed)
Informed family of pt transfer.

## 2014-08-26 NOTE — Progress Notes (Signed)
Patient Demographics  [name], is a 78 y.o. male, DOB - Apr 13, 1936, [id number]  Admit date - 08/25/2014   Admitting Physician Thurnell Lose, MD  Outpatient Primary MD for the patient is Jilda Panda, MD  LOS - 1   Chief Complaint  Patient presents with  . Code Stroke        Subjective:   [name] today has, No headache, No chest pain, No abdominal pain - No Nausea, No new weakness tingling or numbness, No Cough - SOB.    Assessment & Plan    1. Dysarthria with left arm weakness secondary to acute right posterior frontal cortical and subcortical CVA - likely embolic event due to atrial fibrillation,  for now aspirin rectal suppository per neurology and Plavix once taking orally, home dose statin once taking by mouth, continue monitoring on tele, obtain PT, OT, speech following and due for modified barium swallow.    Note he has been decleared not an anticoagulation candidate multiple times in the past for long-standing history of noncompliance, poor balance and past history of alcohol abuse.   TTE     Carotid duplex completed.   Preliminary report: Bilateral: 1-39% ICA stenosis. Vertebral artery flow is antegrade.    No results found for this basename: HGBA1C    Lab Results  Component Value Date   CHOL 156 08/26/2014   HDL 63 08/26/2014   LDLCALC 69 08/26/2014   TRIG 121 08/26/2014   CHOLHDL 2.5 08/26/2014     2. Atrial fibrillation with underlying right bundle branch block. Goal will be rate controll, we will keep him on IV Lopressor when necessary as n.p.o., once tolerating oral continue oral blocker, monitor on telemetry, aspirin for now.  Note he has been decleared not an anticoagulation candidate multiple times in the past for long-standing history of  noncompliance, poor balance and past history of alcohol abuse, now drinks 2-3 times a week.    3. DM type II. Currently n.p.o. so we'll cut down Lantus dose to half, every 4 hours CBGs.  CBG (last 3)   Recent Labs  08/26/14 0437 08/26/14 0719 08/26/14 1130  GLUCAP 113* 115* 387*     No results found for this basename: HGBA1C    4. Hypertension. Allow for permissive hypertension, as needed IV hydralazine and Lopressor.    5. Chronic kidney disease stage III. Baseline creatinine appears to be close to 1.8, currently at or better than baseline. Monitor.    6. Chronic diastolic CHF with EF 38%. Currently compensated change Lasix to 40 mg twice a day. Beta blocker orally once taking by mouth.    7. CAD/PAD. Currently compensated, will continue aspirin, statin and beta blocker once taking orally, no acute issues.    8. History of smoking and intermittent alcohol use now. Counseled to quit both. He denies regular alcohol intake, says he is drinking 2-3 times a week. Monitor closely.    9. BPH. On Flomax. Currently on condom catheter   10. Dyslipidemia. Home dose statin once taking by mouth   11. Low K - replace and recheck.     Code Status: Full  Family Communication: None present  Disposition Plan: To be decided   Procedures CT head, MRI/MRA, carotid duplex, echo  gram, modified barium swallow   Consults Neuro   Medications  Scheduled Meds: . aspirin  300 mg Rectal Daily   Or  . aspirin  325 mg Oral Daily  . atorvastatin  40 mg Oral Daily  . cilostazol  50 mg Oral BID  . finasteride  5 mg Oral Daily  . furosemide  40 mg Intravenous BID  . heparin  5,000 Units Subcutaneous 3 times per day  . insulin aspart  0-9 Units Subcutaneous Q4H  . insulin glargine  10 Units Subcutaneous Daily  . metoprolol  5 mg Intravenous Q4H  . nitroGLYCERIN  1 inch Topical 4 times per day  . sodium chloride  3 mL Intravenous Q12H  . tamsulosin  0.4 mg Oral Daily  .  travoprost (benzalkonium)  1 drop Both Eyes QHS  . valproate sodium  500 mg Intravenous Q12H   Continuous Infusions:  PRN Meds:.albuterol, albuterol, guaiFENesin-dextromethorphan, hydrALAZINE, morphine injection, ondansetron (ZOFRAN) IV, ondansetron, polyethylene glycol  DVT Prophylaxis    Heparin    Lab Results  Component Value Date   PLT 300 08/26/2014    Antibiotics    Anti-infectives   None          Objective:   Filed Vitals:   08/26/14 0300 08/26/14 0440 08/26/14 0700 08/26/14 0720  BP: 119/70 119/70  142/76  Pulse: 95 92  96  Temp: 98 F (36.7 C) 98.3 F (36.8 C) 98.2 F (36.8 C)   TempSrc: Oral Oral Oral   Resp: 15 18  22   Height:      Weight: 97.6 kg (215 lb 2.7 oz)     SpO2: 91% 92%  94%    Wt Readings from Last 3 Encounters:  08/26/14 97.6 kg (215 lb 2.7 oz)  08/14/14 101.696 kg (224 lb 3.2 oz)  07/20/14 100.29 kg (221 lb 1.6 oz)     Intake/Output Summary (Last 24 hours) at 08/26/14 1200 Last data filed at 08/26/14 0800  Gross per 24 hour  Intake    650 ml  Output   3275 ml  Net  -2625 ml     Physical Exam  Awake Alert, Oriented X 3, No new F.N deficits, Normal affect [place].AT,PERRAL Supple Neck,No JVD, No cervical lymphadenopathy appriciated.  Symmetrical Chest wall movement, Good air movement bilaterally, CTAB RRR,No Gallops,Rubs or new Murmurs, No Parasternal Heave +ve B.Sounds, Abd Soft, No tenderness, No organomegaly appriciated, No rebound - guarding or rigidity. No Cyanosis, Clubbing or edema, No new Rash or bruise      Data Review   Micro Results Recent Results (from the past 240 hour(s))  MRSA PCR SCREENING     Status: None   Collection Time    08/25/14  6:35 PM      Result Value Ref Range Status   MRSA by PCR NEGATIVE  NEGATIVE Final   Comment:            The GeneXpert MRSA Assay (FDA     approved for NASAL specimens     only), is one component of a     comprehensive MRSA colonization     surveillance program. It is not       intended to diagnose MRSA     infection nor to guide or     monitor treatment for     MRSA infections.    Radiology Reports Dg Chest 2 View  08/25/2014   CLINICAL DATA:  Stroke.  EXAM: CHEST  2 VIEW  COMPARISON:  One-view chest from  the same day.  FINDINGS: Heart size is normal. Mild bibasilar atelectasis again seen. Mild pulmonary vascular congestion is noted. There is no frank edema effusion suggest failure. The visualized soft tissues and bony thorax are unremarkable.  IMPRESSION: 1. Mild bibasilar atelectasis is stable. 2. Mild pulmonary vascular congestion.   Electronically Signed   By: Lawrence Santiago M.D.   On: 08/25/2014 20:38   Ct Head Wo Contrast  08/25/2014   CLINICAL DATA:  Slurred speech.  Altered mental status.  EXAM: CT HEAD WITHOUT CONTRAST  TECHNIQUE: Contiguous axial images were obtained from the base of the skull through the vertex without intravenous contrast.  COMPARISON:  None.  FINDINGS: There is atrophy and chronic small vessel disease changes. No acute intracranial abnormality. Specifically, no hemorrhage, hydrocephalus, mass lesion, acute infarction, or significant intracranial injury. No acute calvarial abnormality. Visualized paranasal sinuses and mastoids clear. Orbital soft tissues unremarkable.  IMPRESSION: No acute intracranial abnormality.  Atrophy, chronic microvascular disease.  Critical Value/emergent results were called by telephone at the time of interpretation on 08/25/2014 at 2:59 pm to Dr. Doy Mince, who verbally acknowledged these results.   Electronically Signed   By: Rolm Baptise M.D.   On: 08/25/2014 14:58   Mr Virgel Paling Wo Contrast  08/25/2014   CLINICAL DATA:  78 year old male presenting with slurred speech and LEFT-sided weakness. Stroke risk factors include atrial fibrillation, diabetes, hyperlipidemia, hypertension, and smoking.  EXAM: MRI HEAD WITHOUT CONTRAST  MRA HEAD WITHOUT CONTRAST  TECHNIQUE: Multiplanar, multiecho pulse sequences of the brain  and surrounding structures were obtained without intravenous contrast. Angiographic images of the head were obtained using MRA technique without contrast.  COMPARISON:  CT head 08/25/2014.  FINDINGS: MRI HEAD FINDINGS  The patient was unable to remain motionless for the exam. Small or subtle lesions could be overlooked.  There is a moderate-sized area of cortical and subcortical restricted diffusion in the RIGHT posterior frontal region consistent with acute infarction. No other similar areas are seen.  No hemorrhage, mass lesion, or extra-axial fluid. Generalized cerebral and cerebellar atrophy. Hydrocephalus ex vacuo. Mild subcortical and periventricular T2 and FLAIR hyperintensities, likely chronic microvascular ischemic change. Flow voids are maintained throughout the carotid, basilar, and vertebral arteries. There are no areas of chronic hemorrhage.  Pituitary, pineal, and cerebellar tonsils unremarkable. Suspected cervical spondylosis. Visualized calvarium, skull base, and upper cervical osseous structures unremarkable. Scalp and extracranial soft tissues, orbits, sinuses, and mastoids show no acute process. Old medial blowout fracture RIGHT orbit.  Compared with prior CT, the infarct is not visible on that study.  MRA HEAD FINDINGS  RIGHT internal carotid artery demonstrates mild irregularity in its cavernous and supraclinoid segments. Estimated 50% supraclinoid stenosis.  LEFT internal carotid artery demonstrates mild irregularity in its cavernous and supraclinoid segments with estimated 50% stenosis in the supraclinoid region.  Basilar artery widely patent with LEFT vertebral dominant. RIGHT vertebral contributes to a modest degree.  No proximal flow-limiting stenosis of the anterior, middle, or posterior cerebral arteries. No MCA branch occlusion. No cerebellar branch occlusion. No intracranial aneurysm.  Mild irregularity of the distal MCA and PCA branches suggesting intracranial atherosclerotic change.   IMPRESSION: Acute RIGHT posterior frontal cortical and subcortical infarction. No proximal vascular occlusion or parenchymal hemorrhage.  Global atrophy with chronic microvascular ischemic change.  Intracranial atherosclerotic disease affecting the carotid siphon regions and distal MCA and PCA branches, with no proximal flow reducing lesion. No MCA branch occlusion.   Electronically Signed   By: Michae Kava.D.  On: 08/25/2014 17:59   Mr Brain Wo Contrast  08/25/2014   CLINICAL DATA:  78 year old male presenting with slurred speech and LEFT-sided weakness. Stroke risk factors include atrial fibrillation, diabetes, hyperlipidemia, hypertension, and smoking.  EXAM: MRI HEAD WITHOUT CONTRAST  MRA HEAD WITHOUT CONTRAST  TECHNIQUE: Multiplanar, multiecho pulse sequences of the brain and surrounding structures were obtained without intravenous contrast. Angiographic images of the head were obtained using MRA technique without contrast.  COMPARISON:  CT head 08/25/2014.  FINDINGS: MRI HEAD FINDINGS  The patient was unable to remain motionless for the exam. Small or subtle lesions could be overlooked.  There is a moderate-sized area of cortical and subcortical restricted diffusion in the RIGHT posterior frontal region consistent with acute infarction. No other similar areas are seen.  No hemorrhage, mass lesion, or extra-axial fluid. Generalized cerebral and cerebellar atrophy. Hydrocephalus ex vacuo. Mild subcortical and periventricular T2 and FLAIR hyperintensities, likely chronic microvascular ischemic change. Flow voids are maintained throughout the carotid, basilar, and vertebral arteries. There are no areas of chronic hemorrhage.  Pituitary, pineal, and cerebellar tonsils unremarkable. Suspected cervical spondylosis. Visualized calvarium, skull base, and upper cervical osseous structures unremarkable. Scalp and extracranial soft tissues, orbits, sinuses, and mastoids show no acute process. Old medial blowout  fracture RIGHT orbit.  Compared with prior CT, the infarct is not visible on that study.  MRA HEAD FINDINGS  RIGHT internal carotid artery demonstrates mild irregularity in its cavernous and supraclinoid segments. Estimated 50% supraclinoid stenosis.  LEFT internal carotid artery demonstrates mild irregularity in its cavernous and supraclinoid segments with estimated 50% stenosis in the supraclinoid region.  Basilar artery widely patent with LEFT vertebral dominant. RIGHT vertebral contributes to a modest degree.  No proximal flow-limiting stenosis of the anterior, middle, or posterior cerebral arteries. No MCA branch occlusion. No cerebellar branch occlusion. No intracranial aneurysm.  Mild irregularity of the distal MCA and PCA branches suggesting intracranial atherosclerotic change.  IMPRESSION: Acute RIGHT posterior frontal cortical and subcortical infarction. No proximal vascular occlusion or parenchymal hemorrhage.  Global atrophy with chronic microvascular ischemic change.  Intracranial atherosclerotic disease affecting the carotid siphon regions and distal MCA and PCA branches, with no proximal flow reducing lesion. No MCA branch occlusion.   Electronically Signed   By: Rolla Flatten M.D.   On: 08/25/2014 17:59   Dg Chest Port 1 View  08/25/2014   CLINICAL DATA:  Stroke  EXAM: PORTABLE CHEST - 1 VIEW  COMPARISON:  07/16/2014  FINDINGS: Heart is borderline in size. Mild interstitial prominence again noted, similar to prior study. Cannot exclude interstitial edema. Bibasilar opacities likely reflect scarring and/or atelectasis, similar to prior study. No effusions. No acute bony abnormality.  IMPRESSION: Bibasilar atelectasis or scarring.  Cannot exclude mild interstitial edema.   Electronically Signed   By: Rolm Baptise M.D.   On: 08/25/2014 15:10    CBC  Recent Labs Lab 08/25/14 1430 08/25/14 1444 08/26/14 0250  WBC 7.3  --  6.5  HGB 12.4* 14.6 13.3  HCT 37.7* 43.0 40.9  PLT 280  --  300  MCV  88.5  --  90.7  MCH 29.1  --  29.5  MCHC 32.9  --  32.5  RDW 15.5  --  15.8*  LYMPHSABS 2.7  --   --   MONOABS 1.0  --   --   EOSABS 0.4  --   --   BASOSABS 0.1  --   --     Chemistries   Recent Labs Lab  08/25/14 1430 08/25/14 1444 08/26/14 0250  NA 139 140 144  K 3.8 3.8 3.6*  CL 96 96 97  CO2 30  --  36*  GLUCOSE 209* 219* 159*  BUN 25* 25* 24*  CREATININE 1.70* 1.80* 1.68*  CALCIUM 8.9  --  9.8  AST 14  --   --   ALT 5  --   --   ALKPHOS 35*  --   --   BILITOT 0.5  --   --    ------------------------------------------------------------------------------------------------------------------ estimated creatinine clearance is 46 ml/min (by C-G formula based on Cr of 1.68). ------------------------------------------------------------------------------------------------------------------ No results found for this basename: HGBA1C,  in the last 72 hours ------------------------------------------------------------------------------------------------------------------  Recent Labs  08/26/14 0250  CHOL 156  HDL 63  LDLCALC 69  TRIG 121  CHOLHDL 2.5   ------------------------------------------------------------------------------------------------------------------ No results found for this basename: TSH, T4TOTAL, FREET3, T3FREE, THYROIDAB,  in the last 72 hours ------------------------------------------------------------------------------------------------------------------ No results found for this basename: VITAMINB12, FOLATE, FERRITIN, TIBC, IRON, RETICCTPCT,  in the last 72 hours  Coagulation profile  Recent Labs Lab 08/25/14 1430  INR 1.08    No results found for this basename: DDIMER,  in the last 72 hours  Cardiac Enzymes No results found for this basename: CK, CKMB, TROPONINI, MYOGLOBIN,  in the last 168 hours ------------------------------------------------------------------------------------------------------------------ No components found with this  basename: POCBNP,      Time Spent in minutes 35   SINGH,PRASHANT K M.D on 08/26/2014 at 12:00 PM  Between 7am to 7pm - Pager - [phone]  After 7pm go to [email or web address] - password [email or web address]  And look for the night coverage person covering for me after hours  [facility] Office  [phone]   **Disclaimer: This note may have been dictated with voice recognition software. Similar sounding words can inadvertently be transcribed and this note may contain transcription errors which may not have been corrected upon publication of note.**

## 2014-08-26 NOTE — Progress Notes (Signed)
VASCULAR LAB PRELIMINARY  PRELIMINARY  PRELIMINARY  PRELIMINARY  Carotid duplex completed.    Preliminary report:  Bilateral:  1-39% ICA stenosis.  Vertebral artery flow is antegrade.      Faisal Stradling, RVT 08/26/2014, 11:10 AM

## 2014-08-26 NOTE — Evaluation (Signed)
Speech Language Pathology Evaluation Patient Details Name: [name] MRN: [id number] DOB: Jul 27, 1936 Today's Date: 08/26/2014 Time: 4403-4742 SLP Time Calculation (min): 42 min  Problem List:  Patient Active Problem List   Diagnosis Date Noted  . CVA (cerebral infarction) 08/25/2014  . Stroke 08/25/2014  . Acute on chronic diastolic congestive heart failure 07/17/2014  . CKD (chronic kidney disease), stage III 07/17/2014  . Acute respiratory failure with hypoxemia 07/16/2014  . Tobacco abuse 06/27/2014  . Diastolic CHF, chronic 59/56/3875  . Pulmonary edema   . Atrial fibrillation   . Acute respiratory failure 06/24/2014  . Edema 08/20/2011  . Chest pain, unspecified 04/20/2011  . DM (diabetes mellitus), type 2 with renal complications 64/33/2951  . HYPERLIPIDEMIA 03/10/2010  . HYPERTENSION 03/10/2010  . MYOCARDIAL INFARCTION 03/10/2010  . CAD 03/10/2010  . PVD 03/10/2010  . GERD 03/10/2010   Past Medical History:  Past Medical History  Diagnosis Date  . Hyperlipidemia   . HTN (hypertension)     echo 2/11: EF 50-55%, diast dyfxn, severe LVH, inf HK, LAE  . CAD (coronary artery disease)     a.  NSTEMI treated with PCI Feb 2011 with a DES.([facility]);   b. cath 2/11: D1 stents x 2 ok, AV groove CFX occluded with dist AV CFX filled by L-L collats, RCA occluded, OM2 90-95% (treated with PCI)  . MI (myocardial infarction)   . PVD (peripheral vascular disease)   . DM (diabetes mellitus)   . GERD (gastroesophageal reflux disease)   . Bipolar disorder   . Prostate cancer     status post radiation therapy in 2003  . Pulmonary edema   . Atrial fibrillation   . Respiratory difficulty 06/24/2014    intubated   . Hypertensive emergency 06/24/2014  . CKD (chronic kidney disease), stage III    Past Surgical History:  Past Surgical History  Procedure Laterality Date  . Femoral bypass  2003   HPI:  78 y.o. male admitted with dysarthric speech and left sided weakness. Has  multiple stroke risk factors including atrial fibrillation.  Head CT with no acute changes. Failed stroke swallow screen in ED.   Assessment / Plan / Recommendation Clinical Impression  Pt is pleasant, interactive, with mild-moderate cognitive deficits marked by decreased short-term recall, impaired insight, difficulty with selective attention.  Pt's speech is mildly dysarthric with imprecision of consonants and hypernasal resonance.  Recommend SLP f/u for cognition and swallow.      SLP Assessment  Patient needs continued Speech Language Pathology Services    Follow Up Recommendations   (tba)    Frequency and Duration min 3x week  2 weeks   Pertinent Vitals/Pain Pain Assessment: No/denies pain   SLP Goals  SLP Goals Potential to Achieve Goals: Good Progress/Goals/Alternative treatment plan discussed with pt/caregiver and they: Agree  SLP Evaluation Prior Functioning  Cognitive/Linguistic Baseline: Information not available Type of Home: Independent living facility ([facility]) Vocation: Retired   Associate Professor  Overall Cognitive Status: Impaired/Different from baseline Arousal/Alertness: Awake/alert Orientation Level: Oriented X4 Attention: Selective Selective Attention: Impaired Selective Attention Impairment: Functional basic Memory: Impaired Memory Impairment: Storage deficit;Retrieval deficit;Decreased short term memory Decreased Short Term Memory: Verbal basic;Functional basic Awareness: Impaired Awareness Impairment: Intellectual impairment Problem Solving: Impaired Problem Solving Impairment: Verbal basic;Functional basic Safety/Judgment: Impaired    Comprehension  Auditory Comprehension Overall Auditory Comprehension: Appears within functional limits for tasks assessed Visual Recognition/Discrimination Discrimination: Within Function Limits Reading Comprehension Reading Status: Within funtional limits    Expression Expression  Primary Mode of  Expression: Verbal Verbal Expression Overall Verbal Expression: Appears within functional limits for tasks assessed Written Expression Dominant Hand: Right   Oral / Motor Oral Motor/Sensory Function Overall Oral Motor/Sensory Function: Impaired Motor Speech Overall Motor Speech: Impaired Resonance: Hypernasality Articulation: Impaired Level of Impairment: Sentence Motor Planning: Witnin functional limits   Sybrina Laning L. Tivis Ringer, [place] CCC/SLP Pager [phone]      [name] 08/26/2014, 10:38 AM

## 2014-08-27 ENCOUNTER — Ambulatory Visit (INDEPENDENT_AMBULATORY_CARE_PROVIDER_SITE_OTHER): Payer: PRIVATE HEALTH INSURANCE | Admitting: Physician Assistant

## 2014-08-27 DIAGNOSIS — I059 Rheumatic mitral valve disease, unspecified: Secondary | ICD-10-CM

## 2014-08-27 DIAGNOSIS — E1129 Type 2 diabetes mellitus with other diabetic kidney complication: Secondary | ICD-10-CM

## 2014-08-27 DIAGNOSIS — N058 Unspecified nephritic syndrome with other morphologic changes: Secondary | ICD-10-CM

## 2014-08-27 DIAGNOSIS — I4891 Unspecified atrial fibrillation: Secondary | ICD-10-CM

## 2014-08-27 DIAGNOSIS — Z87898 Personal history of other specified conditions: Secondary | ICD-10-CM

## 2014-08-27 LAB — GLUCOSE, CAPILLARY
GLUCOSE-CAPILLARY: 173 mg/dL — AB (ref 70–99)
Glucose-Capillary: 140 mg/dL — ABNORMAL HIGH (ref 70–99)
Glucose-Capillary: 65 mg/dL — ABNORMAL LOW (ref 70–99)
Glucose-Capillary: 100 mg/dL — ABNORMAL HIGH (ref 70–99)
Glucose-Capillary: 163 mg/dL — ABNORMAL HIGH (ref 70–99)

## 2014-08-27 LAB — BASIC METABOLIC PANEL
Anion gap: 9 (ref 5–15)
BUN: 31 mg/dL — ABNORMAL HIGH (ref 6–23)
CHLORIDE: 97 meq/L (ref 96–112)
CO2: 33 meq/L — AB (ref 19–32)
Calcium: 9 mg/dL (ref 8.4–10.5)
Creatinine, Ser: 2.01 mg/dL — ABNORMAL HIGH (ref 0.50–1.35)
GFR calc Af Amer: 36 mL/min — ABNORMAL LOW (ref >90)
GFR calc non Af Amer: 31 mL/min — ABNORMAL LOW (ref >90)
Glucose, Bld: 232 mg/dL — ABNORMAL HIGH (ref 70–99)
Potassium: 4.1 mEq/L (ref 3.7–5.3)
Sodium: 139 mEq/L (ref 137–147)

## 2014-08-27 MED ORDER — FUROSEMIDE 40 MG PO TABS
40.0000 mg | ORAL_TABLET | Freq: Two times a day (BID) | ORAL | Status: DC
Start: 2014-08-28 — End: 2014-08-28
  Administered 2014-08-28: 40 mg via ORAL
  Filled 2014-08-27: qty 1

## 2014-08-27 MED ORDER — SODIUM CHLORIDE 0.9 % IV SOLN: INTRAVENOUS | Status: DC

## 2014-08-27 MED ORDER — INSULIN ASPART 100 UNIT/ML ~~LOC~~ SOLN
3.0000 [IU] | Freq: Three times a day (TID) | SUBCUTANEOUS | Status: DC
  Administered 2014-08-27 (×2): 3 [IU] via SUBCUTANEOUS

## 2014-08-27 MED ORDER — SODIUM CHLORIDE 0.9 % IV SOLN
INTRAVENOUS | Status: AC
  Administered 2014-08-27: 08:00:00 via INTRAVENOUS

## 2014-08-27 MED ORDER — APIXABAN 5 MG PO TABS
5.0000 mg | ORAL_TABLET | Freq: Two times a day (BID) | ORAL | Status: DC
  Administered 2014-08-27 – 2014-08-28 (×2): 5 mg via ORAL
  Filled 2014-08-27 (×2): qty 1

## 2014-08-27 MED ORDER — HYDRALAZINE HCL 20 MG/ML IJ SOLN: 10.0000 mg | Freq: Four times a day (QID) | INTRAMUSCULAR | Status: DC | PRN

## 2014-08-27 MED ORDER — RESOURCE THICKENUP CLEAR PO POWD
ORAL | Status: DC | PRN
  Filled 2014-08-27: qty 125

## 2014-08-27 MED ORDER — DIVALPROEX SODIUM 250 MG PO DR TAB
500.0000 mg | DELAYED_RELEASE_TABLET | Freq: Two times a day (BID) | ORAL | Status: DC
  Administered 2014-08-27 – 2014-08-28 (×3): 500 mg via ORAL
  Filled 2014-08-27 (×4): qty 2

## 2014-08-27 MED ORDER — INSULIN GLARGINE 100 UNIT/ML ~~LOC~~ SOLN
25.0000 [IU] | Freq: Two times a day (BID) | SUBCUTANEOUS | Status: DC
  Administered 2014-08-27 (×2): 25 [IU] via SUBCUTANEOUS
  Filled 2014-08-27 (×5): qty 0.25

## 2014-08-27 NOTE — Evaluation (Signed)
Occupational Therapy Evaluation Patient Details Name: [name] MRN: [id number] DOB: 07/04/1936 Today's Date: 08/27/2014    History of Present Illness 78 y.o. male admitted with dysarthric speech and left sided weakness. Has multiple stroke risk factors including atrial fibrillation.  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   Clinical Impression   Pt admitted with above. Feel pt will benefit from acute OT to increase independence and safety prior to d/c.  Recommending SNF for rehab prior to d/c home as pt does not have 24/7 assist at home.     Follow Up Recommendations  SNF;Supervision/Assistance - 24 hour    Equipment Recommendations  Other (comment) (defer to next venue)    Recommendations for Other Services       Precautions / Restrictions Precautions Precautions: Fall Restrictions Weight Bearing Restrictions: No      Mobility Bed Mobility Overal bed mobility: Modified Independent                Transfers Overall transfer level: Needs assistance Equipment used: Rolling walker (2 wheeled) Transfers: Sit to/from Stand Sit to Stand: Min guard;Min assist         General transfer comment: Pt with LOB on intitial stand from bed.    Balance                                            ADL Overall ADL's : Needs assistance/impaired     Grooming: Oral care;Wash/dry hands;Standing;Min guard (assistance positioning walker at sink)       Lower Body Bathing: Min guard;Sit to/from stand       Lower Body Dressing: Min guard;Sit to/from stand   Toilet Transfer: Min guard;Ambulation;Comfort height toilet;RW;Grab bars   Toileting- Clothing Manipulation and Hygiene: Min guard;Sitting/lateral lean (standing/sitting)       Functional mobility during ADLs: Min guard;Rolling walker;Minimal assistance (assistance to position walker at sink for grooming) General ADL Comments: Educated on safety tips (safe shoewear,  sitting for LB dressing, using basket on walker).      Vision                     Perception     Praxis      Pertinent Vitals/Pain Pain Assessment: No/denies pain     Hand Dominance Right   Extremity/Trunk Assessment Upper Extremity Assessment Upper Extremity Assessment: Overall WFL for tasks assessed   Lower Extremity Assessment Lower Extremity Assessment: Defer to PT evaluation       Communication Communication Communication: No difficulties   Cognition Arousal/Alertness: Awake/alert Behavior During Therapy: WFL for tasks assessed/performed Overall Cognitive Status: Impaired/Different from baseline Area of Impairment: Attention;Problem solving;Safety/judgement   Current Attention Level: Selective     Safety/Judgement: Decreased awareness of safety   Problem Solving: Slow processing     General Comments       Exercises       Shoulder Instructions      Home Living Family/patient expects to be discharged to:: Other (Comment)     Type of Home: Independent living facility                           Additional Comments: [facility];      Prior Functioning/Environment Level of Independence: Needs assistance    ADL's / Homemaking Assistance Needed: aide assists with  bathing   Comments: pt has aide 7 days a week for 2.5 hours per day.    OT Diagnosis: Other (comment) (decreased balance)   OT Problem List: Impaired balance (sitting and/or standing);Decreased knowledge of use of DME or AE;Decreased knowledge of precautions;Decreased cognition;Decreased safety awareness   OT Treatment/Interventions: Self-care/ADL training;DME and/or AE instruction;Therapeutic activities;Patient/family education;Balance training;Cognitive remediation/compensation;Therapeutic exercise    OT Goals(Current goals can be found in the care plan section) Acute Rehab OT Goals Patient Stated Goal: go home OT Goal Formulation:  With patient Time For Goal Achievement: 10/03/14 Potential to Achieve Goals: Good  OT Frequency: Min 2X/week   Barriers to D/C: Decreased caregiver support          Co-evaluation              End of Session Equipment Utilized During Treatment: Gait belt;Rolling walker Nurse Communication: Other (comment) (d/c recommendations)  Activity Tolerance: Patient tolerated treatment well Patient left: in bed;with call bell/phone within reach;with bed alarm set   Time: [phone] OT Time Calculation (min): 29 min Charges:  OT General Charges $OT Visit: 1 Procedure OT Evaluation $Initial OT Evaluation Tier I: 1 Procedure OT Treatments $Self Care/Home Management : 8-22 mins G-CodesBenito Mccreedy OTR/L [phone] 08/27/2014, 11:37 AM

## 2014-08-27 NOTE — Progress Notes (Signed)
Patient Demographics  [name], is a 78 y.o. male, DOB - 15-Sep-1936, BVQ:[id number]  Admit date - 08/25/2014   Admitting Physician Thurnell Lose, MD  Outpatient Primary MD for the patient is Jilda Panda, MD  LOS - 2   Chief Complaint  Patient presents with  . Code Stroke        Subjective:   [name] today has, No headache, No chest pain, No abdominal pain - No Nausea, No new weakness tingling or numbness, No Cough - SOB.    Assessment & Plan    1. 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 Deficit seems to have resolved. Currently on aspirin Plavix along with statin for secondary prevention, seen by PT OT and speech. Speech following and he is currently on dysphagia 3 diet. Treatment was discussed with Dr.Doonquah on 08-26-14 DC Pletal.   Note he has been decleared not an anticoagulation candidate multiple times in the past for long-standing history of noncompliance, poor balance and past history of alcohol abuse.     TTE     Carotid duplex    Preliminary report: Bilateral: 1-39% ICA stenosis. Vertebral artery flow is antegrade.     Lab Results  Component Value Date   HGBA1C 8.4* 08/26/2014    Lab Results  Component Value Date   CHOL 156 08/26/2014   HDL 63 08/26/2014   LDLCALC 69 08/26/2014   TRIG 121 08/26/2014   CHOLHDL 2.5 08/26/2014     2. Atrial fibrillation with underlying right bundle branch block. Goal will be rate controll, on home dose beta blocker, monitor on telemetry, aspirin & plavix for now.  Note he has been decleared not an anticoagulation candidate multiple times in the past for long-standing history of  noncompliance, poor balance and past history of alcohol abuse, now drinks 2-3 times a week.    3. DM type II. adjusted Lantus and sliding scale. Monitor CBGs closely   CBG (last 3)   Recent Labs  08/26/14 1902 08/26/14 2108 08/27/14 0604  GLUCAP 195* 219* 173*     Lab Results  Component Value Date   HGBA1C 8.4* 08/26/2014     4. Hypertension. Now 2 days after acute event Will add low-dose oral beta blocker along with as needed IV hydralazine.    5. Chronic kidney disease stage III. Baseline creatinine appears to be close to 1.8, currently mild ARF. Skip Lasix today, gentle hydration repeat BMP in the morning.    6. Chronic diastolic CHF with EF 80%. Currently compensated change Lasix to 40 mg twice a day. Beta blocker orally.    7. CAD/PAD. Currently compensated, will continue aspirin, statin and beta blocker, no acute issues.    8. History of smoking and intermittent alcohol use now. Counseled to quit both. He denies regular alcohol intake, says he is drinking 2-3 times a week. Monitor closely.     9. BPH. On Flomax. Currently on condom catheter    10. Dyslipidemia. Home dose statin.    11. Low K - replaced and stable.     Code Status: Full  Family Communication: None present  Disposition Plan: To be  decided   Procedures CT head, MRI/MRA, carotid duplex, echo gram, modified barium swallow   Consults Neuro   Medications  Scheduled Meds: . aspirin  325 mg Oral Daily  . atorvastatin  40 mg Oral Daily  . clopidogrel  75 mg Oral Daily  . divalproex  500 mg Oral Q12H  . finasteride  5 mg Oral Daily  . [START ON 08/28/2014] furosemide  40 mg Oral BID  . gabapentin  300 mg Oral BID  . heparin  5,000 Units Subcutaneous 3 times per day  . insulin aspart  0-15 Units Subcutaneous TID WC  . insulin aspart  0-5 Units Subcutaneous QHS  . insulin aspart  3 Units Subcutaneous TID WC  . insulin glargine  25 Units Subcutaneous BID  . metoprolol tartrate   50 mg Oral BID  . sodium chloride  3 mL Intravenous Q12H  . tamsulosin  0.4 mg Oral Daily  . travoprost (benzalkonium)  1 drop Both Eyes QHS   Continuous Infusions: . sodium chloride 100 mL/hr at 08/27/14 0823   PRN Meds:.albuterol, albuterol, guaiFENesin-dextromethorphan, hydrALAZINE, morphine injection, nitroGLYCERIN, ondansetron (ZOFRAN) IV, polyethylene glycol, RESOURCE THICKENUP CLEAR  DVT Prophylaxis    Heparin    Lab Results  Component Value Date   PLT 300 08/26/2014    Antibiotics    Anti-infectives   None          Objective:   Filed Vitals:   08/26/14 2142 08/27/14 0203 08/27/14 0500 08/27/14 0643  BP: 129/75 123/73  149/100  Pulse: 87 89  101  Temp: 98.5 F (36.9 C) 98.1 F (36.7 C)  98.2 F (36.8 C)  TempSrc: Oral Oral  Oral  Resp: 20 20  16   Height:      Weight:   97.523 kg (215 lb)   SpO2: 93% 93%  98%    Wt Readings from Last 3 Encounters:  08/27/14 97.523 kg (215 lb)  08/14/14 101.696 kg (224 lb 3.2 oz)  07/20/14 100.29 kg (221 lb 1.6 oz)     Intake/Output Summary (Last 24 hours) at 08/27/14 0952 Last data filed at 08/26/14 1204  Gross per 24 hour  Intake      0 ml  Output    175 ml  Net   -175 ml     Physical Exam  Awake Alert, Oriented X 3, No new F.N deficits, Normal affect Kouts.AT,PERRAL Supple Neck,No JVD, No cervical lymphadenopathy appriciated.  Symmetrical Chest wall movement, Good air movement bilaterally, CTAB RRR,No Gallops,Rubs or new Murmurs, No Parasternal Heave +ve B.Sounds, Abd Soft, No tenderness, No organomegaly appriciated, No rebound - guarding or rigidity. No Cyanosis, Clubbing or edema, No new Rash or bruise      Data Review   Micro Results Recent Results (from the past 240 hour(s))  MRSA PCR SCREENING     Status: None   Collection Time    08/25/14  6:35 PM      Result Value Ref Range Status   MRSA by PCR NEGATIVE  NEGATIVE Final   Comment:            The GeneXpert MRSA Assay (FDA     approved for  NASAL specimens     only), is one component of a     comprehensive MRSA colonization     surveillance program. It is not     intended to diagnose MRSA     infection nor to guide or     monitor treatment for     MRSA  infections.    Radiology Reports Dg Chest 2 View  08/25/2014   CLINICAL DATA:  Stroke.  EXAM: CHEST  2 VIEW  COMPARISON:  One-view chest from the same day.  FINDINGS: Heart size is normal. Mild bibasilar atelectasis again seen. Mild pulmonary vascular congestion is noted. There is no frank edema effusion suggest failure. The visualized soft tissues and bony thorax are unremarkable.  IMPRESSION: 1. Mild bibasilar atelectasis is stable. 2. Mild pulmonary vascular congestion.   Electronically Signed   By: Lawrence Santiago M.D.   On: 08/25/2014 20:38   Ct Head Wo Contrast  08/25/2014   CLINICAL DATA:  Slurred speech.  Altered mental status.  EXAM: CT HEAD WITHOUT CONTRAST  TECHNIQUE: Contiguous axial images were obtained from the base of the skull through the vertex without intravenous contrast.  COMPARISON:  None.  FINDINGS: There is atrophy and chronic small vessel disease changes. No acute intracranial abnormality. Specifically, no hemorrhage, hydrocephalus, mass lesion, acute infarction, or significant intracranial injury. No acute calvarial abnormality. Visualized paranasal sinuses and mastoids clear. Orbital soft tissues unremarkable.  IMPRESSION: No acute intracranial abnormality.  Atrophy, chronic microvascular disease.  Critical Value/emergent results were called by telephone at the time of interpretation on 08/25/2014 at 2:59 pm to Dr. Doy Mince, who verbally acknowledged these results.   Electronically Signed   By: Rolm Baptise M.D.   On: 08/25/2014 14:58   Mr Virgel Paling Wo Contrast  08/25/2014   CLINICAL DATA:  78 year old male presenting with slurred speech and LEFT-sided weakness. Stroke risk factors include atrial fibrillation, diabetes, hyperlipidemia, hypertension, and smoking.   EXAM: MRI HEAD WITHOUT CONTRAST  MRA HEAD WITHOUT CONTRAST  TECHNIQUE: Multiplanar, multiecho pulse sequences of the brain and surrounding structures were obtained without intravenous contrast. Angiographic images of the head were obtained using MRA technique without contrast.  COMPARISON:  CT head 08/25/2014.  FINDINGS: MRI HEAD FINDINGS  The patient was unable to remain motionless for the exam. Small or subtle lesions could be overlooked.  There is a moderate-sized area of cortical and subcortical restricted diffusion in the RIGHT posterior frontal region consistent with acute infarction. No other similar areas are seen.  No hemorrhage, mass lesion, or extra-axial fluid. Generalized cerebral and cerebellar atrophy. Hydrocephalus ex vacuo. Mild subcortical and periventricular T2 and FLAIR hyperintensities, likely chronic microvascular ischemic change. Flow voids are maintained throughout the carotid, basilar, and vertebral arteries. There are no areas of chronic hemorrhage.  Pituitary, pineal, and cerebellar tonsils unremarkable. Suspected cervical spondylosis. Visualized calvarium, skull base, and upper cervical osseous structures unremarkable. Scalp and extracranial soft tissues, orbits, sinuses, and mastoids show no acute process. Old medial blowout fracture RIGHT orbit.  Compared with prior CT, the infarct is not visible on that study.  MRA HEAD FINDINGS  RIGHT internal carotid artery demonstrates mild irregularity in its cavernous and supraclinoid segments. Estimated 50% supraclinoid stenosis.  LEFT internal carotid artery demonstrates mild irregularity in its cavernous and supraclinoid segments with estimated 50% stenosis in the supraclinoid region.  Basilar artery widely patent with LEFT vertebral dominant. RIGHT vertebral contributes to a modest degree.  No proximal flow-limiting stenosis of the anterior, middle, or posterior cerebral arteries. No MCA branch occlusion. No cerebellar branch occlusion. No  intracranial aneurysm.  Mild irregularity of the distal MCA and PCA branches suggesting intracranial atherosclerotic change.  IMPRESSION: Acute RIGHT posterior frontal cortical and subcortical infarction. No proximal vascular occlusion or parenchymal hemorrhage.  Global atrophy with chronic microvascular ischemic change.  Intracranial atherosclerotic disease affecting the carotid  siphon regions and distal MCA and PCA branches, with no proximal flow reducing lesion. No MCA branch occlusion.   Electronically Signed   By: Rolla Flatten M.D.   On: 08/25/2014 17:59   Mr Brain Wo Contrast  08/25/2014   CLINICAL DATA:  78 year old male presenting with slurred speech and LEFT-sided weakness. Stroke risk factors include atrial fibrillation, diabetes, hyperlipidemia, hypertension, and smoking.  EXAM: MRI HEAD WITHOUT CONTRAST  MRA HEAD WITHOUT CONTRAST  TECHNIQUE: Multiplanar, multiecho pulse sequences of the brain and surrounding structures were obtained without intravenous contrast. Angiographic images of the head were obtained using MRA technique without contrast.  COMPARISON:  CT head 08/25/2014.  FINDINGS: MRI HEAD FINDINGS  The patient was unable to remain motionless for the exam. Small or subtle lesions could be overlooked.  There is a moderate-sized area of cortical and subcortical restricted diffusion in the RIGHT posterior frontal region consistent with acute infarction. No other similar areas are seen.  No hemorrhage, mass lesion, or extra-axial fluid. Generalized cerebral and cerebellar atrophy. Hydrocephalus ex vacuo. Mild subcortical and periventricular T2 and FLAIR hyperintensities, likely chronic microvascular ischemic change. Flow voids are maintained throughout the carotid, basilar, and vertebral arteries. There are no areas of chronic hemorrhage.  Pituitary, pineal, and cerebellar tonsils unremarkable. Suspected cervical spondylosis. Visualized calvarium, skull base, and upper cervical osseous structures  unremarkable. Scalp and extracranial soft tissues, orbits, sinuses, and mastoids show no acute process. Old medial blowout fracture RIGHT orbit.  Compared with prior CT, the infarct is not visible on that study.  MRA HEAD FINDINGS  RIGHT internal carotid artery demonstrates mild irregularity in its cavernous and supraclinoid segments. Estimated 50% supraclinoid stenosis.  LEFT internal carotid artery demonstrates mild irregularity in its cavernous and supraclinoid segments with estimated 50% stenosis in the supraclinoid region.  Basilar artery widely patent with LEFT vertebral dominant. RIGHT vertebral contributes to a modest degree.  No proximal flow-limiting stenosis of the anterior, middle, or posterior cerebral arteries. No MCA branch occlusion. No cerebellar branch occlusion. No intracranial aneurysm.  Mild irregularity of the distal MCA and PCA branches suggesting intracranial atherosclerotic change.  IMPRESSION: Acute RIGHT posterior frontal cortical and subcortical infarction. No proximal vascular occlusion or parenchymal hemorrhage.  Global atrophy with chronic microvascular ischemic change.  Intracranial atherosclerotic disease affecting the carotid siphon regions and distal MCA and PCA branches, with no proximal flow reducing lesion. No MCA branch occlusion.   Electronically Signed   By: Rolla Flatten M.D.   On: 08/25/2014 17:59   Dg Chest Port 1 View  08/25/2014   CLINICAL DATA:  Stroke  EXAM: PORTABLE CHEST - 1 VIEW  COMPARISON:  07/16/2014  FINDINGS: Heart is borderline in size. Mild interstitial prominence again noted, similar to prior study. Cannot exclude interstitial edema. Bibasilar opacities likely reflect scarring and/or atelectasis, similar to prior study. No effusions. No acute bony abnormality.  IMPRESSION: Bibasilar atelectasis or scarring.  Cannot exclude mild interstitial edema.   Electronically Signed   By: Rolm Baptise M.D.   On: 08/25/2014 15:10    CBC  Recent Labs Lab  08/25/14 1430 08/25/14 1444 08/26/14 0250  WBC 7.3  --  6.5  HGB 12.4* 14.6 13.3  HCT 37.7* 43.0 40.9  PLT 280  --  300  MCV 88.5  --  90.7  MCH 29.1  --  29.5  MCHC 32.9  --  32.5  RDW 15.5  --  15.8*  LYMPHSABS 2.7  --   --   MONOABS 1.0  --   --  EOSABS 0.4  --   --   BASOSABS 0.1  --   --     Chemistries   Recent Labs Lab 08/25/14 1430 08/25/14 1444 08/26/14 0250 08/27/14 0002  NA 139 140 144 139  K 3.8 3.8 3.6* 4.1  CL 96 96 97 97  CO2 30  --  36* 33*  GLUCOSE 209* 219* 159* 232*  BUN 25* 25* 24* 31*  CREATININE 1.70* 1.80* 1.68* 2.01*  CALCIUM 8.9  --  9.8 9.0  AST 14  --   --   --   ALT 5  --   --   --   ALKPHOS 35*  --   --   --   BILITOT 0.5  --   --   --    ------------------------------------------------------------------------------------------------------------------ estimated creatinine clearance is 38.4 ml/min (by C-G formula based on Cr of 2.01). ------------------------------------------------------------------------------------------------------------------  Recent Labs  08/26/14 0250  HGBA1C 8.4*   ------------------------------------------------------------------------------------------------------------------  Recent Labs  08/26/14 0250  CHOL 156  HDL 63  LDLCALC 69  TRIG 121  CHOLHDL 2.5   ------------------------------------------------------------------------------------------------------------------ No results found for this basename: TSH, T4TOTAL, FREET3, T3FREE, THYROIDAB,  in the last 72 hours ------------------------------------------------------------------------------------------------------------------ No results found for this basename: VITAMINB12, FOLATE, FERRITIN, TIBC, IRON, RETICCTPCT,  in the last 72 hours  Coagulation profile  Recent Labs Lab 08/25/14 1430  INR 1.08    No results found for this basename: DDIMER,  in the last 72 hours  Cardiac Enzymes No results found for this basename: CK, CKMB,  TROPONINI, MYOGLOBIN,  in the last 168 hours ------------------------------------------------------------------------------------------------------------------ No components found with this basename: POCBNP,      Time Spent in minutes 35   SINGH,PRASHANT K M.D on 08/27/2014 at 9:52 AM  Between 7am to 7pm - Pager - [phone]  After 7pm go to [email or web address] - password [email or web address]  And look for the night coverage person covering for me after hours  [facility] Office  [phone]   **Disclaimer: This note may have been dictated with voice recognition software. Similar sounding words can inadvertently be transcribed and this note may contain transcription errors which may not have been corrected upon publication of note.**

## 2014-08-27 NOTE — Progress Notes (Signed)
Speech Language Pathology Treatment: Dysphagia;Cognitive-Linquistic  Patient Details Name: [name] MRN: [id number] DOB: [birth year] Today's Date: 08/27/2014 Time: 3329-5188 SLP Time Calculation (min): 33 min  Assessment / Plan / Recommendation Clinical Impression  Pt overtly coughing and expectorating viscous white tinged secretions while eating upon SLP entrance to room.  Pt states he choked on grits.  Pt expressed displeasure with not being allowed fried chicken - advised him to finding of MBS yesterday and overt coughing for approximately 10 minutes today after aspiration with breakfast.    SLP posted signs in room for pt to compensate for his memory and had him read them to this SLP for understanding.  With moderate verbal cueing pt utilized posted signs for compliance with compensation strategies.    Of note, pt reports baseline dysphagia for which he sought treatment at [facility] "a few years ago".  Pt states he was scoped and then placed on a medication but uncertain if this medication was helpful to him.  He states symptoms were feeling that his throat was "narrowed".  He does admit his dysphagia is worse now than prior to admit.    Poor insight noted re: aspiration as pt again requested fried chicken but this SLP recommends to continue dys3/thin diet due to level of dysphagia.  Pt benefited from moderate assist to demonstrate emergent awareness to current level dysphagia and possible pulmonary ramifications given his COPD.   Recommend continue absolute full supervision with meals of dys3/thin diet - order thickener to use if indicated *pt coughing despite chin tuck and small boluses.  Using teach back, all education reinforced.  Will follow for cog-ling and dysphagia tx.     HPI HPI: 78 y.o. male admitted with dysarthric speech and left sided weakness. Has multiple stroke risk factors including atrial fibrillation.  MRI:  Acute RIGHT posterior frontal cortical and subcortical  infarction.  Bedside swallow eval 8/30 revealed s/s of dysphagia with risk of aspiration; MBS recommended.    Pertinent Vitals Pain Assessment: No/denies pain  SLP Plan  Continue with current plan of care    Recommendations Diet recommendations: Dysphagia 3 (mechanical soft);Thin liquid (use thickener if pt coughing with thin despite chin tuck and small boluses) Liquids provided via: No straw;Cup Medication Administration: Whole meds with puree Supervision: Patient able to self feed;Full supervision/cueing for compensatory strategies (full supervision - strict) Compensations: Slow rate;Small sips/bites Postural Changes and/or Swallow Maneuvers: Chin tuck              Oral Care Recommendations: Oral care BID Follow up Recommendations: Skilled Nursing facility (pt currently unable to reliably recall compensation strategies, concern for safety with level of dysphagia) Plan: Continue with current plan of care    [place], [place], [facility] SLP [phone]

## 2014-08-27 NOTE — Progress Notes (Signed)
  Echocardiogram 2D Echocardiogram has been performed.  [name] 08/27/2014, 4:12 PM

## 2014-08-27 NOTE — Progress Notes (Signed)
[facility]  Patient Status: Active (receiving services up to time of hospitalization)  [facility] is providing the following services: RN - will need new orders when he goes home  If patient discharges after hours, please call [phone].   [facility] 08/27/2014, 10:46 AM

## 2014-08-27 NOTE — Clinical Social Work Note (Signed)
CSW attempted to speak with pt's daughter, Revonda Humphrey, regarding possible SNF placement at time of discharge. CSW will continue to follow and assist with discharge planning.  Lubertha Sayres, MSW, [facility] Licensed Clinical Social Worker [phone] and [phone]

## 2014-08-27 NOTE — Progress Notes (Signed)
08/26/14 1202  SLP Visit Information  SLP Received On 08/26/14  Subjective  Subjective pleasant, cooperative  General Information  Date of Onset 08/25/14  HPI 78 y.o. male admitted with dysarthric speech and left sided weakness. Has multiple stroke risk factors including atrial fibrillation.  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  Bedside swallow eval 8/30 revealed s/s of dysphagia with risk of aspiration; MBS recommended.   Type of Study MBSS  Reason for Referral Objectively evaluate swallowing function  Temperature Spikes Noted No  Respiratory Status Room air  History of Recent Intubation No  Behavior/Cognition Alert;Cooperative;Pleasant mood  Oral Cavity - Dentition Dentures, bottom;Dentures, top  Oral Motor / Sensory Function Impaired - see Bedside swallow eval  Self-Feeding Abilities Able to feed self  Patient Positioning Upright in chair  Baseline Vocal Quality Clear  Volitional Cough Strong  Volitional Swallow Able to elicit  Anatomy [facility]  Pharyngeal Secretions Not observed secondary MBS  Oral Assessment (Complete on admission/transfer/change in patient condition)  Does patient have any of the following "high risk" factors? Nutritional status - fluids only or NPO for >24 hours  Oral Preparation/Oral Phase  Oral Phase WFL  Pharyngeal Phase  Pharyngeal Phase Impaired  Pharyngeal - Thin  Pharyngeal - Thin Cup Premature spillage to pyriform sinuses;Reduced airway/laryngeal closure;Penetration/Aspiration during swallow;Trace aspiration  Pharyngeal - Thin Straw Premature spillage to pyriform sinuses;Reduced airway/laryngeal closure;Penetration/Aspiration during swallow;Trace aspiration  Penetration/Aspiration details (thin cup) Material enters airway, passes BELOW cords and not ejected out despite cough attempt by patient  Penetration/Aspiration details (thin straw) Material enters airway, passes BELOW cords and not ejected out despite cough  attempt by patient  Pharyngeal Phase - Comment  Pharyngeal Comment chin tuck combined with smaller bolus prevented aspiration  Clinical Impression  Dysphagia Diagnosis Mild pharyngeal phase dysphagia  Clinical impression Pt presents with a mild pharyngeal dysphagia marked primarily by premature spillage of thin liquids into the pyriform sinuses, followed by immediate penetration of larynx and subsequent aspiration.  Oral preparation and propulsion, pharyngeal peristalsis/clearance were adequate with other consistencies.  A chin tuck, combined with a small bolus, was sufficient to eliminate aspiration of thin liquids.  Performance on MBS was far better than anticipated based on clinical exam - when pt is deliberate and attentive and controls his rate/quantity of consumption, his swallow function is notably improved.  Recommend dysphagia 3 diet with thin liquids and chin tuck.  (Pt's diet may be advanced per clinical reassessment since aspiration is not silent and elicits a strong cough response.) SLP will follow for strategies/safety.   Swallow Evaluation Recommendations  Diet Recommendations Dysphagia 3 (Mechanical Soft);Thin liquid  Liquid Administration via Cup  Medication Administration Whole meds with puree  Supervision Patient able to self feed;Full supervision/cueing for compensatory strategies (full supervision until pt is reliably following strategies)  Compensations Slow rate;Small sips/bites  Postural Changes and/or Swallow Maneuvers Chin tuck  Oral Care Recommendations Oral care BID  Follow up Recommendations Home health SLP  Treatment Plan  Treatment Plan Recommendations Therapy as outlined in treatment plan below  Speech Therapy Frequency min 3x week  Treatment Duration 1 week  Interventions Aspiration precaution training;Compensatory techniques;Patient/family education  Individuals Consulted  Consulted and Agree with Results and Recommendations Patient;RN  SLP Time Calculation   SLP Start Time 1761  SLP Stop Time 6073  SLP Time Calculation (min) 30 min  SLP Evaluations  $ SLP Speech Visit 1 Procedure  SLP Evaluations  $MBS Swallow 1 Procedure   Note signed on behalf  of evaluating therapist Juan Quam, [place]) who completed MBS on Sun 8/30 at 1230.  Note signed 8/31 at [place] am.    Luanna Salk, [facility] SLP [phone]

## 2014-08-27 NOTE — Consult Note (Addendum)
PHARMACY NOTE  Pharmacy Consult :  78 y.o. male is to begin Apixaban per Pharmacy consult for Non-valvular atrial fibrillation .   Dosing Wt :  97.5 kg   Hematology :  Recent Labs  08/25/14 1430 08/25/14 1444 08/26/14 0250 08/27/14 0002  HGB 12.4* 14.6 13.3  --   HCT 37.7* 43.0 40.9  --   PLT 280  --  300  --   APTT 34  --   --   --   LABPROT 14.0  --   --   --   INR 1.08  --   --   --   CREATININE 1.70* 1.80* 1.68* 2.01*    Current Medication[s] Include: Medication PTA: Prescriptions prior to admission  Medication Sig Dispense Refill  . aspirin 325 MG EC tablet Take 325 mg by mouth daily.       Marland Kitchen atorvastatin (LIPITOR) 40 MG tablet Take 1 tablet (40 mg total) by mouth daily.  90 tablet  3  . cilostazol (PLETAL) 50 MG tablet Take 50 mg by mouth 2 (two) times daily.      . divalproex (DEPAKOTE) 250 MG EC tablet Take 500 mg by mouth 2 (two) times daily.       . finasteride (PROSCAR) 5 MG tablet Take 5 mg by mouth daily.        . furosemide (LASIX) 40 MG tablet Take 40 mg by mouth 2 (two) times daily.       Marland Kitchen gabapentin (NEURONTIN) 300 MG capsule Take 300-600 mg by mouth 2 (two) times daily. 600mg  in the morning and 300mg  in the evening      . insulin glargine (LANTUS) 100 UNIT/ML injection Inject 40 Units into the skin daily.      . insulin regular (HUMULIN R,NOVOLIN R) 100 units/mL injection Inject 8 Units into the skin 2 (two) times daily before a meal.       . metoprolol (LOPRESSOR) 50 MG tablet Take 1 tablet (50 mg total) by mouth 2 (two) times daily.  60 tablet  11  . pantoprazole (PROTONIX) 20 MG tablet Take 20 mg by mouth daily.      Marland Kitchen spironolactone (ALDACTONE) 25 MG tablet Take 25 mg by mouth daily.      . Tamsulosin HCl (FLOMAX) 0.4 MG CAPS Take 0.4 mg by mouth daily.        . travoprost, benzalkonium, (TRAVATAN) 0.004 % ophthalmic solution Place 1 drop into both eyes at bedtime.        Marland Kitchen albuterol (PROVENTIL HFA;VENTOLIN HFA) 108 (90  BASE) MCG/ACT inhaler Inhale 2 puffs into the lungs every 6 (six) hours as needed for shortness of breath.      . carvedilol (COREG) 12.5 MG tablet       . sildenafil (VIAGRA) 100 MG tablet Take 100 mg by mouth daily as needed for erectile dysfunction.       Scheduled:  Scheduled:  . atorvastatin  40 mg Oral Daily  . divalproex  500 mg Oral Q12H  . finasteride  5 mg Oral Daily  . [START ON 08/28/2014] furosemide  40 mg Oral BID  . gabapentin  300 mg Oral BID  . heparin  5,000 Units Subcutaneous 3 times per day  . insulin aspart  0-15 Units Subcutaneous TID WC  . insulin aspart  0-5 Units Subcutaneous QHS  . insulin aspart  3 Units Subcutaneous TID WC  . insulin glargine  25 Units Subcutaneous BID  . metoprolol tartrate  50 mg Oral  BID  . sodium chloride  3 mL Intravenous Q12H  . tamsulosin  0.4 mg Oral Daily  . travoprost (benzalkonium)  1 drop Both Eyes QHS   Infusion[s]: Infusions:   Antibiotic[s]: Anti-infectives   None     Assessment :  Patient is a 78 y/o male who is to be started on Apixaban for Non-valvular atrial fibrillation.   No Dose adjustments required at this time.  Although he has CKD with baseline Scr 2, the patient is < 80 yrs of age and his weight is greater than 60 kg.  No evidence of bleeding complications observed.  Goal : Full dose Apixaban for Non-valvular atrial fibrillation   Plan : 1. Begin Apixaban 5 mg po BID. 2. Apixaban discharge education. 3. Monitor CBC's, Monitor for bleeding complications.  Follow Platelet counts. 4. DC Heparin [done].  Niketa Turner, Craig Guess,  Pharm.D  08/27/2014  5:44 PM

## 2014-08-27 NOTE — Progress Notes (Signed)
Pt blood sugar dropped to 65.  8 oz orange juice given, blood sugar rechecked up to 100, insulin held.  Will continue to monitor. Cori Razor, RN 08/27/2014

## 2014-08-27 NOTE — Progress Notes (Addendum)
Physical Therapy Treatment Patient Details Name: [name] MRN: [id number] DOB: 1936-10-03 Today's Date: 08/27/2014    History of Present Illness 78 y.o. male admitted with dysarthric speech and left sided weakness. Has multiple stroke risk factors including atrial fibrillation.  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    PT Comments    Pt progressing with therapy but continues to have some cognition deficits; however, will quickly deflect when he becomes confused. Recommend 24/7 (A) for safety at this time. Will cont to follow per POC.   Follow Up Recommendations  SNF;Supervision/Assistance - 24 hour     Equipment Recommendations  None recommended by PT    Recommendations for Other Services       Precautions / Restrictions Precautions Precautions: Fall Restrictions Weight Bearing Restrictions: No    Mobility  Bed Mobility Overal bed mobility: Modified Independent                Transfers Overall transfer level: Needs assistance Equipment used: Rolling walker (2 wheeled) Transfers: Sit to/from Stand Sit to Stand: Min guard         General transfer comment: cues for safety with transfers and hand placement; decr safety awarenss with transfers   Ambulation/Gait Ambulation/Gait assistance: Min guard Ambulation Distance (Feet): 300 Feet Assistive device: Rolling walker (2 wheeled) Gait Pattern/deviations: Step-through pattern;Decreased stride length;Shuffle;Wide base of support;Trunk flexed Gait velocity: decreased Gait velocity interpretation: Below normal speed for age/gender General Gait Details: pt fatigued quickly; required cues for navigating back to room; tactile cues for upright posture    Stairs            Wheelchair Mobility    Modified Rankin (Stroke Patients Only) Modified Rankin (Stroke Patients Only) Pre-Morbid Rankin Score: Moderately severe disability Modified Rankin: Moderately severe  disability     Balance Overall balance assessment: Needs assistance         Standing balance support: During functional activity;Bilateral upper extremity supported Standing balance-Leahy Scale: Poor Standing balance comment: requries UE support at all times                     Cognition Arousal/Alertness: Awake/alert Behavior During Therapy: WFL for tasks assessed/performed Overall Cognitive Status: Impaired/Different from baseline Area of Impairment: Attention;Problem solving;Safety/judgement Orientation Level: Disoriented to;Time;Situation Current Attention Level: Selective Memory: Decreased short-term memory   Safety/Judgement: Decreased awareness of safety   Problem Solving: Slow processing General Comments: "i cant remember >>>, i dont know why people keep asking me"     Exercises      General Comments        Pertinent Vitals/Pain Pain Assessment: No/denies pain    Home Living Family/patient expects to be discharged to:: Other (Comment)     Type of Home: Independent living facility         Additional Comments: Lives in a senior apartments independent living;    Prior Function Level of Independence: Needs assistance    ADL's / Homemaking Assistance Needed: aide assists with bathing Comments: pt has aide 7 days a week for 2.5 hours per day.   PT Goals (current goals can now be found in the care plan section) Acute Rehab PT Goals Patient Stated Goal: to not have all these people working with me PT Goal Formulation: With patient Time For Goal Achievement: 09/01/14 Potential to Achieve Goals: Good Progress towards PT goals: Progressing toward goals    Frequency  Min 3X/week    PT Plan Discharge plan needs to be updated  Co-evaluation             End of Session Equipment Utilized During Treatment: Gait belt Activity Tolerance: Patient tolerated treatment well Patient left: in chair;with call bell/phone within reach;with chair alarm  set     Time: 5797-2820 PT Time Calculation (min): 24 min  Charges:  [employer] Training: 23-37 mins                    G CodesGustavus Bryant, [place]  [phone] 08/27/2014, 1:25 PM

## 2014-08-27 NOTE — Progress Notes (Signed)
STROKE TEAM PROGRESS NOTE   HISTORY [name] is an 78 y.o. male who reports going to bed at baseline last evening. He awakened at about 0200 08/25/2014 and noted no focal problems. He awakened again at 1230 and noted left sided weakness and slurred speech. EMS was called at that time and the patient was brought in as a code stroke. Initial NIHSS of 6.   tPA Given: No: Outside time window   SUBJECTIVE (INTERVAL HISTORY) Patient sitting up in the chair. He reported anticipated d/c today, but he will stay here due to kidney issues. He had afib not on anticoagulation due to previous noncompliance. He had clinical visit with Dr. Burt Knack 2 weeks ago and he needs to see Dr. Burt Knack ASAP after discharge regarding anticoagulation.   OBJECTIVE Temp:  [97.7 F (36.5 C)-98.5 F (36.9 C)] 97.7 F (36.5 C) (08/31 1058) Pulse Rate:  [52-101] 52 (08/31 1058) Resp:  [16-20] 18 (08/31 1058) BP: (114-155)/(57-100) 155/86 mmHg (08/31 1058) SpO2:  [93 %-98 %] 95 % (08/31 1058) Weight:  [97.523 kg (215 lb)] 97.523 kg (215 lb) (08/31 0500)   Recent Labs Lab 08/26/14 1130 08/26/14 1902 08/26/14 2108 08/27/14 0604 08/27/14 1149  GLUCAP 387* 195* 219* 173* 140*    Recent Labs Lab 08/25/14 1430 08/25/14 1444 08/26/14 0250 08/27/14 0002  NA 139 140 144 139  K 3.8 3.8 3.6* 4.1  CL 96 96 97 97  CO2 30  --  36* 33*  GLUCOSE 209* 219* 159* 232*  BUN 25* 25* 24* 31*  CREATININE 1.70* 1.80* 1.68* 2.01*  CALCIUM 8.9  --  9.8 9.0    Recent Labs Lab 08/25/14 1430  AST 14  ALT 5  ALKPHOS 35*  BILITOT 0.5  PROT 7.3  ALBUMIN 2.9*    Recent Labs Lab 08/25/14 1430 08/25/14 1444 08/26/14 0250  WBC 7.3  --  6.5  NEUTROABS 3.2  --   --   HGB 12.4* 14.6 13.3  HCT 37.7* 43.0 40.9  MCV 88.5  --  90.7  PLT 280  --  300   No results found for this basename: CKTOTAL, CKMB, CKMBINDEX, TROPONINI,  in the last 168 hours  Recent Labs  08/25/14 1430  LABPROT 14.0  INR 1.08    Recent  Labs  08/25/14 1602  COLORURINE YELLOW  LABSPEC 1.008  PHURINE 6.5  GLUCOSEU NEGATIVE  HGBUR TRACE*  BILIRUBINUR NEGATIVE  KETONESUR NEGATIVE  PROTEINUR NEGATIVE  UROBILINOGEN 1.0  NITRITE NEGATIVE  LEUKOCYTESUR NEGATIVE       Component Value Date/Time   CHOL 156 08/26/2014 0250   TRIG 121 08/26/2014 0250   HDL 63 08/26/2014 0250   CHOLHDL 2.5 08/26/2014 0250   VLDL 24 08/26/2014 0250   LDLCALC 69 08/26/2014 0250   Lab Results  Component Value Date   HGBA1C 8.4* 08/26/2014      Component Value Date/Time   LABOPIA NONE DETECTED 08/25/2014 1602   COCAINSCRNUR NONE DETECTED 08/25/2014 1602   LABBENZ NONE DETECTED 08/25/2014 1602   AMPHETMU NONE DETECTED 08/25/2014 1602   THCU NONE DETECTED 08/25/2014 1602   LABBARB NONE DETECTED 08/25/2014 1602     Recent Labs Lab 08/25/14 1430  ETH <11    Dg Chest Port 1 View 08/25/2014    Bibasilar atelectasis or scarring.  Cannot exclude mild interstitial edema.  Dg Chest 2 View 08/25/2014    1. Mild bibasilar atelectasis is stable. 2. Mild pulmonary vascular congestion.     Ct Head Wo Contrast 08/25/2014  No acute intracranial abnormality.  Atrophy, chronic microvascular disease.    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  No proximal vascular occlusion or parenchymal hemorrhage. Global atrophy with chronic microvascular ischemic change.  Intracranial atherosclerotic disease affecting the carotid siphon regions and distal MCA and PCA branches, with no proximal flow reducing lesion. No MCA branch occlusion.      2D Echocardiogram  06/24/2014 EF 55-60% with no source of embolus. Hypokinesis of the inferior myocardium. Doppler parameters are consistent with a reversible restrictive pattern, indicative of decreased left ventricular diastolic compliance and/or increased left atrial pressure (grade 3 diastolic dysfunction). Left atrium: The atrium was  moderately  dilated.  Carotid Doppler  No evidence of hemodynamically significant internal carotid artery stenosis. Vertebral artery flow is antegrade.    PHYSICAL EXAM GENERAL: He is in no acute distress. HEENT: Supple. Atraumatic normocephalic.  ABDOMEN: soft EXTREMITIES: No edema  BACK: Normal. SKIN: Normal by inspection. MENTAL STATUS: Alert and oriented. Speech, language and cognition are generally intact. Judgment and insight fair.  CRANIAL NERVES: Pupils are equal, round and reactive to light and accommodation; extra ocular movements are full, there is no significant nystagmus; visual fields are full; upper and lower facial muscles are normal in strength and symmetric, there is no flattening of the nasolabial folds; tongue is midline; uvula is midline; shoulder elevation is normal. MOTOR: Normal tone, bulk and strength; no pronator drift. COORDINATION: Left finger to nose is normal, right finger to nose is normal, No rest tremor; no intention tremor; no postural tremor; no bradykinesia.   ASSESSMENT/PLAN [name] is a 78 y.o. male with hx of hyperlipidemia, hypertension, coronary artery disease, peripheral vascular disease, diabetes mellitus, bipolar disorder, pulmonary edema, atrial fibrillation, and chronic kidney disease presenting with left hemiparesis and dysarthria. He did not receive IV t-PA secondary to late presentation. Imaging confirms an acute RIGHT posterior frontal cortical and subcortical infarction.   Stroke - acute RIGHT posterior frontal cortical and subcortical infarction felt secondary to Atrial fibrillation. aspirin 325 mg orally every day prior to admission, now on aspirin 325 mg orally every day and clopidogrel 75 mg orally every day. Dr. Burt Knack has reviewed chart/situation. "pt well-known to me. I think considering his stroke and recent cessation of alcohol, it would be ok to stop ASA and plavix and start him on a NOAC". Will ask pharmacy to dose d/t creatinine  2.0. Dr. Candiss Norse notified.  Recommend anticoagulation if agreeable with Dr. Shela Leff doc. Will not start at this time due to significant hx of noncompliance.  MRI - Acute RIGHT posterior frontal cortical and subcortical infarction.   MRA - no  flow reducing lesions  2D Echo  No source of embolus in June. No need to repeat from neuro standpoint  Carotid no significant stenosis  Subcutaneous heparin for VTE prophylaxis  Dysphagia 3 thin liquids  Up with assistance.   Therapy needs:  SNF given no assistance at home  Risk factor management/education  Patient counseled to be compliant with his antithrombotic medications  Disposition:  SNF vs home with [facility]  Hypertension   Home meds:  Lopressor and Aldactone. Lopressor resumed BP: (114-155)/(57-100) 135/81 mmHg (08/31 1502)  Stable  Patient counseled to be compliant with his blood pressure medications  Hyperlipidemia  Home meds:  Lipitor 40 mg daily - resumed in hospital.  LDL - 69   At LDL goal < 100 (<70 for diabetics)  Diabetes  Home meds:  Lantus insulin  HgbA1c 8.4  Uncontrolled  Goal < 7.0  Educated patient about lifestyle changes for diabetes prevention  Other Stroke Risk Factors Advanced age ETOH use   Coronary artery disease  Other Active Problems  Hypokalemia - resolved at 4.1  Renal insufficiency, Cr 2.01  Hospital day # 2  No further stroke workup indicated.  Patient has a 10-15% risk of having another stroke over the next year, the highest risk is within 2 weeks of the most recent stroke/TIA (risk of having a stroke following a stroke or TIA is the same).  Ongoing risk factor control by Primary Care Physician  Stroke Service will sign off. Please call should any needs arise.  Follow up with Dr. Erlinda Hong, [facility], in 2 months.  Burnetta Sabin, MSN, RN, ANVP-BC, ANP-BC, [facility] Pager: [phone] 08/27/2014 4:07 PM  I, the attending vascular neurologist, have  personally obtained a history, examined the patient, evaluated laboratory data, individually viewed imaging studies, and formulated the assessment and plan of care.  I have made any additions or clarifications directly to the above note and agree with the findings and plan as currently documented.   Rosalin Hawking, MD PhD Stroke Neurology 08/27/2014 11:45 PM  To contact Stroke Continuity provider, please refer to [email or web address] After hours, contact General Neurology

## 2014-08-28 LAB — GLUCOSE, CAPILLARY
GLUCOSE-CAPILLARY: 207 mg/dL — AB (ref 70–99)
Glucose-Capillary: 114 mg/dL — ABNORMAL HIGH (ref 70–99)

## 2014-08-28 LAB — BASIC METABOLIC PANEL
Anion gap: 12 (ref 5–15)
BUN: 22 mg/dL (ref 6–23)
CALCIUM: 8.7 mg/dL (ref 8.4–10.5)
CHLORIDE: 103 meq/L (ref 96–112)
CO2: 28 mEq/L (ref 19–32)
CREATININE: 1.45 mg/dL — AB (ref 0.50–1.35)
GFR calc non Af Amer: 46 mL/min — ABNORMAL LOW (ref >90)
GFR, EST AFRICAN AMERICAN: 53 mL/min — AB (ref >90)
Glucose, Bld: 112 mg/dL — ABNORMAL HIGH (ref 70–99)
Potassium: 4.2 mEq/L (ref 3.7–5.3)
Sodium: 143 mEq/L (ref 137–147)

## 2014-08-28 MED ORDER — INSULIN GLARGINE 100 UNIT/ML ~~LOC~~ SOLN
20.0000 [IU] | Freq: Two times a day (BID) | SUBCUTANEOUS | Status: DC
  Administered 2014-08-28: 20 [IU] via SUBCUTANEOUS
  Filled 2014-08-28 (×2): qty 0.2

## 2014-08-28 MED ORDER — APIXABAN 5 MG PO TABS: 5.0000 mg | ORAL_TABLET | Freq: Two times a day (BID) | ORAL | Status: DC

## 2014-08-28 NOTE — Discharge Instructions (Signed)
Follow with Primary MD Jilda Panda, MD in 7 days   Get CBC, CMP, 2 view Chest X ray checked  by Primary MD next visit.    Activity: As tolerated with Full fall precautions use walker/cane & assistance as needed   Disposition Home     Diet: Heart Healthy Low carb, with aspiration precautions as taught while in this hospital  Accuchecks 4 times/day, Once in AM empty stomach and then before each meal. Log in all results and show them to your Prim.MD in 3 days. If any glucose reading is under 80 or above 300 call your Prim MD immidiately. Follow Low glucose instructions for glucose under 80 as instructed.    For Heart failure patients - Check your Weight same time everyday, if you gain over 2 pounds, or you develop in leg swelling, experience more shortness of breath or chest pain, call your Primary MD immediately. Follow Cardiac Low Salt Diet and 1.8 lit/day fluid restriction.   On your next visit with her primary care physician please Get Medicines reviewed and adjusted.  Please request your Prim.MD to go over all Hospital Tests and Procedure/Radiological results at the follow up, please get all Hospital records sent to your Prim MD by signing hospital release before you go home.   If you experience worsening of your admission symptoms, develop shortness of breath, life threatening emergency, suicidal or homicidal thoughts you must seek medical attention immediately by calling 911 or calling your MD immediately  if symptoms less severe.  You Must read complete instructions/literature along with all the possible adverse reactions/side effects for all the Medicines you take and that have been prescribed to you. Take any new Medicines after you have completely understood and accpet all the possible adverse reactions/side effects.   Do not drive, operating heavy machinery, perform activities at heights, swimming or participation in water activities or provide baby sitting services if your  were admitted for syncope or siezures until you have seen by Primary MD or a Neurologist and advised to do so again.  Do not drive when taking Pain medications.    Do not take more than prescribed Pain, Sleep and Anxiety Medications  Special Instructions: If you have smoked or chewed Tobacco  in the last 2 yrs please stop smoking, stop any regular Alcohol  and or any Recreational drug use.  Wear Seat belts while driving.   Please note  You were cared for by a hospitalist during your hospital stay. If you have any questions about your discharge medications or the care you received while you were in the hospital after you are discharged, you can call the unit and asked to speak with the hospitalist on call if the hospitalist that took care of you is not available. Once you are discharged, your primary care physician will handle any further medical issues. Please note that NO REFILLS for any discharge medications will be authorized once you are discharged, as it is imperative that you return to your primary care physician (or establish a relationship with a primary care physician if you do not have one) for your aftercare needs so that they can reassess your need for medications and monitor your lab values.

## 2014-08-28 NOTE — Progress Notes (Signed)
UR complete.  Kourtnee Lahey RN, MSN 

## 2014-08-28 NOTE — Progress Notes (Signed)
Speech Language Pathology Treatment: Dysphagia  Patient Details Name: [name] MRN: [id number] DOB: 15-May-1936 Today's Date: 08/28/2014 Time: 8887-5797 SLP Time Calculation (min): 20 min  Assessment / Plan / Recommendation Clinical Impression  Pt with much improved tolerance of po diet today, sitting upright in bed and following strategies with modified independence.  Pt recalled SLP from visit yesterday with delay stating "you look different today".    Intake listed as 70-100% and pt is afebrile with clear lung sounds.  Pt today ate slowly and masticated thoroughly.   SLP  questions if aspiration episode yesterday influenced pt to be more cautious.  Pt did state he became choked today x1 and again requests diet advancement.    As pt with adequate adherence to precautions, masticates well and eats slowly, recommend consider advancement in diet to regular.    Recommend follow up SLP at next venue of care to address cognitive linguistic and dysphagia goals.     Pt agreeable to follow up SLP and showed improved cognition today by examining floor to determine if dry enough to get OOB to use restroom - encouraged him to call for assist.   Pt obviously making gains in his cog-ling and swallowing arenas.    HPI HPI: 78 y.o. male admitted with dysarthric speech and left sided weakness. Has multiple stroke risk factors including atrial fibrillation.  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  Bedside swallow eval 8/30 revealed s/s of dysphagia with risk of aspiration; MBS recommended completed.  Pt seen for dysphagia treatment to determine tolerance of po diet.     Pertinent Vitals Pain Assessment: No/denies pain  SLP Plan  Continue with current plan of care    Recommendations Diet recommendations: Regular;Thin liquid Liquids provided via: No straw;Cup Medication Administration: Whole meds with puree Supervision: Patient able to self feed;Full  supervision/cueing for compensatory strategies (intermittent supervision) Compensations: Slow rate;Small sips/bites Postural Changes and/or Swallow Maneuvers: Chin tuck              Oral Care Recommendations: Oral care BID Follow up Recommendations: Skilled Nursing facility Plan: Continue with current plan of care    [place], [facility] SLP [phone]

## 2014-08-28 NOTE — Discharge Summary (Signed)
[name], is a 78 y.o. male  DOB 05/18/1936  MRN [id number].  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.11] Type 2 diabetes mellitus with diabetic nephropathy [250.40, 583.81] Congestive heart failure, unspecified congestive heart failure chronicity, unspecified congestive heart failure type [428.0]   Discharge Diagnosis  Unspecified essential hypertension [401.9] Tobacco abuse [305.1] Stroke [434.91] CKD (chronic kidney disease), stage III [585.3] Chronic atrial fibrillation [427.31] Cerebral infarction due to embolism of cerebral artery [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 Hyperlipidemia   . HTN (hypertension)     echo 2/11: EF 50-55%, diast dyfxn, severe LVH, inf HK, LAE  . CAD (coronary artery disease)     a.  NSTEMI treated with PCI Feb 2011 with a DES.([facility]);    b. cath 2/11: D1 stents x 2 ok, AV groove CFX occluded with dist AV CFX filled by L-L collats, RCA occluded, OM2 90-95% (treated with PCI)  . MI (myocardial infarction)   . PVD (peripheral vascular disease)   . DM (diabetes mellitus)   . GERD (gastroesophageal reflux disease)   . Bipolar disorder   . Prostate cancer     status post radiation therapy in 2003  . Pulmonary edema   . Atrial fibrillation   . Respiratory difficulty 06/24/2014    intubated   . Hypertensive emergency 06/24/2014  . CKD (chronic kidney disease), stage III     Past Surgical History  Procedure Laterality Date  . Femoral bypass  2003       History of present illness and  Hospital Course:     Kindly see H&P for history of present illness and admission details, please review complete Labs, Consult reports and Test reports for all details in brief  HPI  from the history and physical done on the day of admission  [name] is a 78 y.o. male, with history of atrial fibrillation was on aspirin and ruled not a candidate for anticoagulation by cardiology due to long-standing history of poor balance, alcohol abuse and noncompliance, hypertension, dyslipidemia, type 2 diabetes mellitus non-insulin-dependent, GERD, bipolar disorder, prostate cancer, CKD  3 baseline creatinine around 1.8, chronic diastolic grade 3 CHF with baseline EF of 50-55%, who was recently admitted for acute on chronic respiratory failure secondary to diastolic CHF decompensation, he is now brought in by EMS after home health nurse called EMS as she noticed over the phone that patient sounded to have difficulty in speech and somewhat confused around 1 PM this afternoon. She had seen the patient in reasonably good health yesterday at home during her home health visit. When EMS arrived they noticed that patient had some difficulty in speech mostly dysarthria with some reported left arm weakness .   In the ER patient was seen by neurology for code  stroke, head CT was unremarkable, dysarthria and left arm weakness were confirmed, he was again noted to be in A. fib with right bundle branch block on EKG, patient is minimally confused but still answering all questions appropriately but oriented x1, she denies any headache, no chest abdominal pain, no shortness of breath, denies any palpitations, he does realize that his speech is not at baseline and that he has some left arm weakness. Iam admitting him for stroke workup. Per ER M.D. when he came he appeared short of breath and ER M.D. prefers 24-hour position stepdown.     Hospital Course   1. 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 Deficit seems to have resolved. Seen by PT OT speech. Neurology discussed his case with his primary cardiologist Dr. Burt Knack, now placed on Eliquis along with statin for secondary prevention. He will get home speech therapy and PT. We'll follow with cardiology and neurology closely in the outpatient setting.    Lab Results   Component  Value  Date    HGBA1C  8.4*  08/26/2014    Lab Results   Component  Value  Date    CHOL  156  08/26/2014    HDL  63  08/26/2014    LDLCALC  69  08/26/2014    TRIG  121  08/26/2014    CHOLHDL  2.5  08/26/2014     2. Atrial fibrillation with underlying right bundle branch block. Goal will be rate controll, on home dose beta blocker, switched to Eliquis as suggested by his cardiologist Dr. Burt Knack to the neurology team. Have requested him to follow with his cardiologist within a week.   3. DM type II. continue home regimen, his A1c was 8.4, he has long-standing history of noncompliance and I will request PCP to adjust insulin cautiously. He is resistant to his insulin regimen being changed here.    4. Hypertension. Now 2 days after acute event Will add low-dose oral beta blocker along with as needed IV  hydralazine.    5. Chronic kidney disease stage III. Baseline creatinine appears to be close to 1.8, currently mild ARF. Skip Lasix today, gentle hydration repeat BMP in the morning.     6. Chronic diastolic CHF with EF 91%. Currently compensated commence home regimen, requested him to follow with his cardiologist within a week. She does not want to stay in the hospital for further cardiac workup or evaluation and he was sought. Says he's feeling fine.    7. CAD/PAD. Currently compensated, will continue Eliquis, statin and beta blocker, no acute issues.     8. History of smoking and intermittent alcohol use now. Counseled to quit both. He denies regular alcohol intake, says he  is drinking 2-3 times a week. Counseled to quit both.    9. BPH. On Flomax. Stable.    10. Dyslipidemia. Home dose statin.     11. Low K - replaced and stable.       Discharge Condition:stable   Follow UP  Follow-up Information   Follow up with Xu,Jindong, MD. Schedule an appointment as soon as possible for a visit in 2 months. (Stroke Clinic)    Specialty:  Neurology   Contact information:   [address] [place] [address] [phone]       Follow up with Jilda Panda, MD. Schedule an appointment as soon as possible for a visit in 1 week.   Specialty:  Internal Medicine   Contact information:   [address] [place] [address] [phone]       Follow up with Sherren Mocha, MD.   Specialty:  Cardiology   Contact information:   [address] [place] [address] [phone]         Discharge Instructions  and  Discharge Medications     Discharge Instructions   Discharge instructions    Complete by:  As directed   Follow with Primary MD Jilda Panda, MD in 7 days   Get CBC, CMP, 2 view Chest X ray checked  by Primary MD next visit.    Activity: As tolerated with Full fall precautions use walker/cane & assistance as  needed   Disposition Home     Diet: Heart Healthy Low carb, with aspiration precautions as taught while in this hospital  Accuchecks 4 times/day, Once in AM empty stomach and then before each meal. Log in all results and show them to your Prim.MD in 3 days. If any glucose reading is under 80 or above 300 call your Prim MD immidiately. Follow Low glucose instructions for glucose under 80 as instructed.    For Heart failure patients - Check your Weight same time everyday, if you gain over 2 pounds, or you develop in leg swelling, experience more shortness of breath or chest pain, call your Primary MD immediately. Follow Cardiac Low Salt Diet and 1.8 lit/day fluid restriction.   On your next visit with her primary care physician please Get Medicines reviewed and adjusted.  Please request your Prim.MD to go over all Hospital Tests and Procedure/Radiological results at the follow up, please get all Hospital records sent to your Prim MD by signing hospital release before you go home.   If you experience worsening of your admission symptoms, develop shortness of breath, life threatening emergency, suicidal or homicidal thoughts you must seek medical attention immediately by calling 911 or calling your MD immediately  if symptoms less severe.  You Must read complete instructions/literature along with all the possible adverse reactions/side effects for all the Medicines you take and that have been prescribed to you. Take any new Medicines after you have completely understood and accpet all the possible adverse reactions/side effects.   Do not drive, operating heavy machinery, perform activities at heights, swimming or participation in water activities or provide baby sitting services if your were admitted for syncope or siezures until you have seen by Primary MD or a Neurologist and advised to do so again.  Do not drive when taking Pain medications.    Do not take more than prescribed Pain,  Sleep and Anxiety Medications  Special Instructions: If you have smoked or chewed Tobacco  in the last 2 yrs please stop smoking, stop any  regular Alcohol  and or any Recreational drug use.  Wear Seat belts while driving.   Please note  You were cared for by a hospitalist during your hospital stay. If you have any questions about your discharge medications or the care you received while you were in the hospital after you are discharged, you can call the unit and asked to speak with the hospitalist on call if the hospitalist that took care of you is not available. Once you are discharged, your primary care physician will handle any further medical issues. Please note that NO REFILLS for any discharge medications will be authorized once you are discharged, as it is imperative that you return to your primary care physician (or establish a relationship with a primary care physician if you do not have one) for your aftercare needs so that they can reassess your need for medications and monitor your lab values.     Increase activity slowly    Complete by:  As directed             Medication List    STOP taking these medications       aspirin 325 MG EC tablet      TAKE these medications       albuterol 108 (90 BASE) MCG/ACT inhaler  Commonly known as:  PROVENTIL HFA;VENTOLIN HFA  Inhale 2 puffs into the lungs every 6 (six) hours as needed for shortness of breath.     apixaban 5 MG Tabs tablet  Commonly known as:  ELIQUIS  Take 1 tablet (5 mg total) by mouth 2 (two) times daily.     atorvastatin 40 MG tablet  Commonly known as:  LIPITOR  Take 1 tablet (40 mg total) by mouth daily.     carvedilol 12.5 MG tablet  Commonly known as:  COREG     cilostazol 50 MG tablet  Commonly known as:  PLETAL  Take 50 mg by mouth 2 (two) times daily.     divalproex 250 MG DR tablet  Commonly known as:  DEPAKOTE  Take 500 mg by mouth 2 (two) times daily.     finasteride 5 MG tablet  Commonly  known as:  PROSCAR  Take 5 mg by mouth daily.     furosemide 40 MG tablet  Commonly known as:  LASIX  Take 40 mg by mouth 2 (two) times daily.     gabapentin 300 MG capsule  Commonly known as:  NEURONTIN  Take 300-600 mg by mouth 2 (two) times daily. 600mg  in the morning and 300mg  in the evening     insulin glargine 100 UNIT/ML injection  Commonly known as:  LANTUS  Inject 40 Units into the skin daily.     insulin regular 100 units/mL injection  Commonly known as:  NOVOLIN R,HUMULIN R  Inject 8 Units into the skin 2 (two) times daily before a meal.     metoprolol 50 MG tablet  Commonly known as:  LOPRESSOR  Take 1 tablet (50 mg total) by mouth 2 (two) times daily.     pantoprazole 20 MG tablet  Commonly known as:  PROTONIX  Take 20 mg by mouth daily.     spironolactone 25 MG tablet  Commonly known as:  ALDACTONE  Take 25 mg by mouth daily.     tamsulosin 0.4 MG Caps capsule  Commonly known as:  FLOMAX  Take 0.4 mg by mouth daily.     travoprost (benzalkonium) 0.004 % ophthalmic solution  Commonly known as:  TRAVATAN  Place 1 drop into both eyes at bedtime.     VIAGRA 100 MG tablet  Generic drug:  sildenafil  Take 100 mg by mouth daily as needed for erectile dysfunction.          Diet and Activity recommendation: See Discharge Instructions above   Consults obtained - Neuro, Cards Dr Burt Knack by neurology over the phone   Major procedures and Radiology Reports - PLEASE review detailed and final reports for all details, in brief -   TTE   - Mild LV dilatation with mild systolic dysfunction and regional wall motio abnormalities.Mild systolic RV dysfunction. Ef 40-45%   Carotid duplex  Preliminary report: Bilateral: 1-39% ICA stenosis. Vertebral artery flow is antegrade.     Dg Chest 2 View  08/25/2014   CLINICAL DATA:  Stroke.  EXAM: CHEST  2 VIEW  COMPARISON:  One-view chest from the same day.  FINDINGS: Heart size is normal. Mild bibasilar atelectasis  again seen. Mild pulmonary vascular congestion is noted. There is no frank edema effusion suggest failure. The visualized soft tissues and bony thorax are unremarkable.  IMPRESSION: 1. Mild bibasilar atelectasis is stable. 2. Mild pulmonary vascular congestion.   Electronically Signed   By: Lawrence Santiago M.D.   On: 08/25/2014 20:38   Ct Head Wo Contrast  08/25/2014   CLINICAL DATA:  Slurred speech.  Altered mental status.  EXAM: CT HEAD WITHOUT CONTRAST  TECHNIQUE: Contiguous axial images were obtained from the base of the skull through the vertex without intravenous contrast.  COMPARISON:  None.  FINDINGS: There is atrophy and chronic small vessel disease changes. No acute intracranial abnormality. Specifically, no hemorrhage, hydrocephalus, mass lesion, acute infarction, or significant intracranial injury. No acute calvarial abnormality. Visualized paranasal sinuses and mastoids clear. Orbital soft tissues unremarkable.  IMPRESSION: No acute intracranial abnormality.  Atrophy, chronic microvascular disease.  Critical Value/emergent results were called by telephone at the time of interpretation on 08/25/2014 at 2:59 pm to Dr. Doy Mince, who verbally acknowledged these results.   Electronically Signed   By: Rolm Baptise M.D.   On: 08/25/2014 14:58   Mr Virgel Paling Wo Contrast  08/25/2014   CLINICAL DATA:  78 year old male presenting with slurred speech and LEFT-sided weakness. Stroke risk factors include atrial fibrillation, diabetes, hyperlipidemia, hypertension, and smoking.  EXAM: MRI HEAD WITHOUT CONTRAST  MRA HEAD WITHOUT CONTRAST  TECHNIQUE: Multiplanar, multiecho pulse sequences of the brain and surrounding structures were obtained without intravenous contrast. Angiographic images of the head were obtained using MRA technique without contrast.  COMPARISON:  CT head 08/25/2014.  FINDINGS: MRI HEAD FINDINGS  The patient was unable to remain motionless for the exam. Small or subtle lesions could be overlooked.   There is a moderate-sized area of cortical and subcortical restricted diffusion in the RIGHT posterior frontal region consistent with acute infarction. No other similar areas are seen.  No hemorrhage, mass lesion, or extra-axial fluid. Generalized cerebral and cerebellar atrophy. Hydrocephalus ex vacuo. Mild subcortical and periventricular T2 and FLAIR hyperintensities, likely chronic microvascular ischemic change. Flow voids are maintained throughout the carotid, basilar, and vertebral arteries. There are no areas of chronic hemorrhage.  Pituitary, pineal, and cerebellar tonsils unremarkable. Suspected cervical spondylosis. Visualized calvarium, skull base, and upper cervical osseous structures unremarkable. Scalp and extracranial soft tissues, orbits, sinuses, and mastoids show no acute process. Old medial blowout fracture RIGHT orbit.  Compared with prior CT, the infarct is not visible on that study.  MRA HEAD FINDINGS  RIGHT internal  carotid artery demonstrates mild irregularity in its cavernous and supraclinoid segments. Estimated 50% supraclinoid stenosis.  LEFT internal carotid artery demonstrates mild irregularity in its cavernous and supraclinoid segments with estimated 50% stenosis in the supraclinoid region.  Basilar artery widely patent with LEFT vertebral dominant. RIGHT vertebral contributes to a modest degree.  No proximal flow-limiting stenosis of the anterior, middle, or posterior cerebral arteries. No MCA branch occlusion. No cerebellar branch occlusion. No intracranial aneurysm.  Mild irregularity of the distal MCA and PCA branches suggesting intracranial atherosclerotic change.  IMPRESSION: Acute RIGHT posterior frontal cortical and subcortical infarction. No proximal vascular occlusion or parenchymal hemorrhage.  Global atrophy with chronic microvascular ischemic change.  Intracranial atherosclerotic disease affecting the carotid siphon regions and distal MCA and PCA branches, with no proximal  flow reducing lesion. No MCA branch occlusion.   Electronically Signed   By: Rolla Flatten M.D.   On: 08/25/2014 17:59   Mr Brain Wo Contrast  08/25/2014   CLINICAL DATA:  78 year old male presenting with slurred speech and LEFT-sided weakness. Stroke risk factors include atrial fibrillation, diabetes, hyperlipidemia, hypertension, and smoking.  EXAM: MRI HEAD WITHOUT CONTRAST  MRA HEAD WITHOUT CONTRAST  TECHNIQUE: Multiplanar, multiecho pulse sequences of the brain and surrounding structures were obtained without intravenous contrast. Angiographic images of the head were obtained using MRA technique without contrast.  COMPARISON:  CT head 08/25/2014.  FINDINGS: MRI HEAD FINDINGS  The patient was unable to remain motionless for the exam. Small or subtle lesions could be overlooked.  There is a moderate-sized area of cortical and subcortical restricted diffusion in the RIGHT posterior frontal region consistent with acute infarction. No other similar areas are seen.  No hemorrhage, mass lesion, or extra-axial fluid. Generalized cerebral and cerebellar atrophy. Hydrocephalus ex vacuo. Mild subcortical and periventricular T2 and FLAIR hyperintensities, likely chronic microvascular ischemic change. Flow voids are maintained throughout the carotid, basilar, and vertebral arteries. There are no areas of chronic hemorrhage.  Pituitary, pineal, and cerebellar tonsils unremarkable. Suspected cervical spondylosis. Visualized calvarium, skull base, and upper cervical osseous structures unremarkable. Scalp and extracranial soft tissues, orbits, sinuses, and mastoids show no acute process. Old medial blowout fracture RIGHT orbit.  Compared with prior CT, the infarct is not visible on that study.  MRA HEAD FINDINGS  RIGHT internal carotid artery demonstrates mild irregularity in its cavernous and supraclinoid segments. Estimated 50% supraclinoid stenosis.  LEFT internal carotid artery demonstrates mild irregularity in its  cavernous and supraclinoid segments with estimated 50% stenosis in the supraclinoid region.  Basilar artery widely patent with LEFT vertebral dominant. RIGHT vertebral contributes to a modest degree.  No proximal flow-limiting stenosis of the anterior, middle, or posterior cerebral arteries. No MCA branch occlusion. No cerebellar branch occlusion. No intracranial aneurysm.  Mild irregularity of the distal MCA and PCA branches suggesting intracranial atherosclerotic change.  IMPRESSION: Acute RIGHT posterior frontal cortical and subcortical infarction. No proximal vascular occlusion or parenchymal hemorrhage.  Global atrophy with chronic microvascular ischemic change.  Intracranial atherosclerotic disease affecting the carotid siphon regions and distal MCA and PCA branches, with no proximal flow reducing lesion. No MCA branch occlusion.   Electronically Signed   By: Rolla Flatten M.D.   On: 08/25/2014 17:59   Dg Chest Port 1 View  08/25/2014   CLINICAL DATA:  Stroke  EXAM: PORTABLE CHEST - 1 VIEW  COMPARISON:  07/16/2014  FINDINGS: Heart is borderline in size. Mild interstitial prominence again noted, similar to prior study. Cannot exclude interstitial edema. Bibasilar opacities  likely reflect scarring and/or atelectasis, similar to prior study. No effusions. No acute bony abnormality.  IMPRESSION: Bibasilar atelectasis or scarring.  Cannot exclude mild interstitial edema.   Electronically Signed   By: Rolm Baptise M.D.   On: 08/25/2014 15:10    Micro Results      Recent Results (from the past 240 hour(s))  MRSA PCR SCREENING     Status: None   Collection Time    08/25/14  6:35 PM      Result Value Ref Range Status   MRSA by PCR NEGATIVE  NEGATIVE Final   Comment:            The GeneXpert MRSA Assay (FDA     approved for NASAL specimens     only), is one component of a     comprehensive MRSA colonization     surveillance program. It is not     intended to diagnose MRSA     infection nor to guide  or     monitor treatment for     MRSA infections.       Today   Subjective:   [name] today has no headache,no chest abdominal pain,no new weakness tingling or numbness, feels much better wants to go home today.   Objective:   Blood pressure 163/67, pulse 95, temperature 98.4 F (36.9 C), temperature source Oral, resp. rate 18, height 6' (1.829 m), weight 97.523 kg (215 lb), SpO2 97.00%.   Intake/Output Summary (Last 24 hours) at 08/28/14 1031 Last data filed at 08/28/14 0913  Gross per 24 hour  Intake    640 ml  Output   1300 ml  Net   -660 ml    Exam Awake Alert, Oriented x 3, No new F.N deficits, Normal affect [place].AT,PERRAL Supple Neck,No JVD, No cervical lymphadenopathy appriciated.  Symmetrical Chest wall movement, Good air movement bilaterally, CTAB RRR,No Gallops,Rubs or new Murmurs, No Parasternal Heave +ve B.Sounds, Abd Soft, Non tender, No organomegaly appriciated, No rebound -guarding or rigidity. No Cyanosis, Clubbing or edema, No new Rash or bruise  Data Review   CBC w Diff: Lab Results  Component Value Date   WBC 6.5 08/26/2014   HGB 13.3 08/26/2014   HCT 40.9 08/26/2014   PLT 300 08/26/2014   LYMPHOPCT 36 08/25/2014   MONOPCT 14* 08/25/2014   EOSPCT 5 08/25/2014   BASOPCT 1 08/25/2014    CMP: Lab Results  Component Value Date   NA 143 08/28/2014   K 4.2 08/28/2014   CL 103 08/28/2014   CO2 28 08/28/2014   BUN 22 08/28/2014   CREATININE 1.45* 08/28/2014   PROT 7.3 08/25/2014   ALBUMIN 2.9* 08/25/2014   BILITOT 0.5 08/25/2014   ALKPHOS 35* 08/25/2014   AST 14 08/25/2014   ALT 5 08/25/2014  .   Total Time in preparing paper work, data evaluation and todays exam - 35 minutes  Thurnell Lose M.D on 08/28/2014 at 10:31 AM  [facility] Office  [phone]   **Disclaimer: This note may have been dictated with voice recognition software. Similar sounding words can inadvertently be transcribed and this note may contain transcription errors  which may not have been corrected upon publication of note.**

## 2014-08-28 NOTE — Care Management Note (Addendum)
  Page 1 of 1   08/28/2014     10:55:29 AM CARE MANAGEMENT NOTE 08/28/2014  Patient:  [name], [name]   Account Number:  [id number]  Date Initiated:  08/28/2014  Documentation initiated by:  Lorne Skeens  Subjective/Objective Assessment:   Patient was admitted with CVA. Lives alone in an independent living apartment. Active with [facility] for [facility], has a [facility] 7 days a week through [place].     Action/Plan:   Will follow for discharge needs pending PT/OT evals and physician orders.   Anticipated DC Date:  08/28/2014   Anticipated DC Plan:  [place]  CM consult      Choice offered to / List presented to:  C-1 Patient        [place] arranged  HH-1 RN  [place].   Status of service:  Completed, signed off Medicare Important Message given?  YES (If response is "NO", the following Medicare IM given date fields will be blank) Date Medicare IM given:  08/28/2014 Medicare IM given by:  Lorne Skeens Date Additional Medicare IM given:   Additional Medicare IM given by:    Discharge Disposition:  [place]  Per UR Regulation:    If discussed at Long Length of Stay Meetings, dates discussed:    Comments:  08/28/14 [place] RN, MSN, CM- Met with patient to discuss discharge planning needs. Patient would like to continue [facility] services through [facility]. Mary with [facility] was notified and has accepted the referral for discharge home today. Patient was given a 30 day free trial card for Eliquis.  Medicare IM letter provided.

## 2014-08-29 ENCOUNTER — Encounter: Payer: Self-pay | Admitting: Cardiology

## 2014-08-29 ENCOUNTER — Ambulatory Visit (INDEPENDENT_AMBULATORY_CARE_PROVIDER_SITE_OTHER): Payer: PRIVATE HEALTH INSURANCE | Admitting: Cardiology

## 2014-08-29 VITALS — BP 148/78 | HR 96 | Ht 72.0 in | Wt 218.0 lb

## 2014-08-29 DIAGNOSIS — I219 Acute myocardial infarction, unspecified: Secondary | ICD-10-CM

## 2014-08-29 NOTE — Patient Instructions (Signed)
Your physician has requested that you regularly monitor and record your blood pressure readings at home with [facility]. Please use the same machine at the same time of day to check your readings and record them to bring to your follow-up visit. If pressure is increases please give [place] a call   Your physician recommends that you continue on your current medications as directed. Please refer to the Current Medication list given to you today.    Your physician recommends that you schedule a follow-up appointment in: 3 months with DR. Burt Knack

## 2014-08-29 NOTE — Progress Notes (Signed)
08/29/2014 [name]   [birth year]  [id number]  Primary Physicia Jilda Panda, MD Primary Cardiologist: Dr. Burt Knack   HPI:  [name] presents to clinic today for post hospital f/u after a recent admission where he was treated at [facility] for an acute CVA. He is a 78 y/o male, followed by Dr. Burt Knack. His history is notable for atrial fibrillation was on aspirin and ruled not a candidate for anticoagulation by cardiology due to long-standing history of poor balance, alcohol abuse and noncompliance. Also with h/o hypertension, dyslipidemia, type 2 diabetes mellitus non-insulin-dependent, GERD, bipolar disorder, prostate cancer, CKD 3 baseline creatinine around 1.8, chronic diastolic grade 3 CHF with baseline EF of 50-55%.  He initially presented to [facility] on 08/25/14 after being noticed by Home Health to be confused with dysarthria. On arrival to [facility], he was noted to have left arm weakness. In the ER patient was seen by neurology for code stroke, head CT was unremarkable, dysarthria and left arm weakness were confirmed, he was again noted to be in A. fib with right bundle branch block on EKG.  He was admitted for further w/u. A MRA revealed acute right posterior frontal cortical and subcortical CVA. This was felt likely subsequent to an embolic given his atrial fibrillation. Luckily, his deficits resolved and he returned to baseline. Neurology discussed his case with his primary cardiologist Dr. Burt Knack, and the decision was made to place on Eliquis along with statin for secondary prevention. He was discharged home on 08/28/14 with a [place], RN, PT and speech therapist.   He presents to clinic today after being released yesterday. He states that he feels well. No recurrent unilateral weakness, confusion or speech difficulties. After discharge from the hospital yesterday, he did not make it to the pharmacy to get his Eliquis prescription filled. Thus, he missed his PM dose last night and his am dose  today.   His EKG reveals atrial fibrillation with a controlled ventricular response.      Current Outpatient Prescriptions  Medication Sig Dispense Refill  . albuterol (PROVENTIL HFA;VENTOLIN HFA) 108 (90 BASE) MCG/ACT inhaler Inhale 2 puffs into the lungs every 6 (six) hours as needed for shortness of breath.      Marland Kitchen apixaban (ELIQUIS) 5 MG TABS tablet Take 1 tablet (5 mg total) by mouth 2 (two) times daily.  60 tablet  0  . atorvastatin (LIPITOR) 40 MG tablet Take 1 tablet (40 mg total) by mouth daily.  90 tablet  3  . cilostazol (PLETAL) 50 MG tablet Take 50 mg by mouth 2 (two) times daily.      . divalproex (DEPAKOTE) 250 MG EC tablet Take 500 mg by mouth 2 (two) times daily.       . finasteride (PROSCAR) 5 MG tablet Take 5 mg by mouth daily.        . furosemide (LASIX) 40 MG tablet Take 40 mg by mouth 2 (two) times daily.       Marland Kitchen gabapentin (NEURONTIN) 300 MG capsule Take 300-600 mg by mouth 2 (two) times daily. 600mg  in the morning and 300mg  in the evening      . insulin glargine (LANTUS) 100 UNIT/ML injection Inject 40 Units into the skin daily.      . insulin regular (HUMULIN R,NOVOLIN R) 100 units/mL injection Inject 8 Units into the skin 2 (two) times daily before a meal.       . metoprolol (LOPRESSOR) 50 MG tablet Take 1 tablet (50 mg total) by  mouth 2 (two) times daily.  60 tablet  11  . pantoprazole (PROTONIX) 20 MG tablet Take 20 mg by mouth daily.      . sildenafil (VIAGRA) 100 MG tablet Take 100 mg by mouth daily as needed for erectile dysfunction.      Marland Kitchen spironolactone (ALDACTONE) 25 MG tablet Take 25 mg by mouth daily.      . Tamsulosin HCl (FLOMAX) 0.4 MG CAPS Take 0.4 mg by mouth daily.        . travoprost, benzalkonium, (TRAVATAN) 0.004 % ophthalmic solution Place 1 drop into both eyes at bedtime.         No current facility-administered medications for this visit.    Allergies  Allergen Reactions  . Penicillins Other (See Comments)     hands and feet edema     History   Social History  . Marital Status: Divorced    Spouse Name: N/A    Number of Children: N/A  . Years of Education: N/A   Occupational History  . retired Architect    Social History Main Topics  . Smoking status: Former Smoker -- 0.50 packs/day for 50 years    Types: Cigarettes    Quit date: 06/30/2014  . Smokeless tobacco: Not on file     Comment: He has a 50-pack-year hx of tobacco abuse. Currently, smoking about half a pack a day.  . Alcohol Use: Yes     Comment: Previously drank heavily, and now has a drink every other day or so.  . Drug Use: No  . Sexual Activity: Not on file   Other Topics Concern  . Not on file   Social History Narrative  . No narrative on file     Review of Systems: General: negative for chills, fever, night sweats or weight changes.  Cardiovascular: negative for chest pain, dyspnea on exertion, edema, orthopnea, palpitations, paroxysmal nocturnal dyspnea or shortness of breath Dermatological: negative for rash Respiratory: negative for cough or wheezing Urologic: negative for hematuria Abdominal: negative for nausea, vomiting, diarrhea, bright red blood per rectum, melena, or hematemesis Neurologic: negative for visual changes, syncope, or dizziness All other systems reviewed and are otherwise negative except as noted above.    Blood pressure 148/78, pulse 96, height 6' (1.829 m), weight 218 lb (98.884 kg).  General appearance: alert, cooperative and no distress Neck: no carotid bruit and no JVD Lungs: clear to auscultation bilaterally Heart: irregularly irregular rhythm Extremities: no LEE Pulses: 2+ and symmetric Skin: Skin color, texture, turgor normal. No rashes or lesions Neurologic: Grossly normal  EKG Atrial Fibrillation 96 bpm  ASSESSMENT AND PLAN:   1. CVA: s/p acute right posterior frontal cortical and subcortical CVA felt likely embolic secondary to atrial fibrillation. Deficits including left sided weakness  and dysarthria resolved. Now on Eliquis and statin for secondary prevention.   2. Atrial Fibrillation: EKG shows controlled ventricular response. Continue metoprolol for rate control. Continue Eliquis for stroke prophylaxis.   3. Chronic Anticoagulation: Eliquis just initiated, however patient missed 2 doses because he did not make it to the pharmacy after discharge yesterday. We will provide free samples to cover patient until he gets prescription filled. The importance of full compliance twice daily was stressed to prevent recurrent CVA. It was also highly recommended that he refrain from excessive alcohol consumption as intoxication can lead to increased risk for falls. He verbalized understanding.   4. HTN: BP mildly elevated in the 323F systolic, however patient reports that he missed his  am meds. Daily compliance was stressed.   PLAN  Continue medical therapy as outlined above. F/u with Dr. Burt Knack in 6-8 weeks for reassessment.   SIMMONS, BRITTAINYPA-C 08/29/2014 3:21 PM

## 2014-08-29 NOTE — Progress Notes (Signed)
Patient in hospital.

## 2014-08-30 NOTE — ED Provider Notes (Signed)
Medical screening examination/treatment/procedure(s) were conducted as a shared visit with non-physician practitioner(s) and myself.  I personally evaluated the patient during the encounter.   EKG Interpretation   Date/Time:  Saturday August 25 2014 14:12:31 EDT Ventricular Rate:  106 PR Interval:    QRS Duration: 150 QT Interval:  400 QTC Calculation: 531 R Axis:   -125 Text Interpretation:  Atrial fibrillation Ventricular premature complex  Right bundle branch block ED PHYSICIAN INTERPRETATION AVAILABLE IN [place]  [facility] Confirmed by TEST, Record ([id number]) on 08/27/2014 7:50:39 AM        Tanna Furry, MD 08/30/14 2139

## 2014-08-31 ENCOUNTER — Telehealth: Payer: Self-pay | Admitting: Cardiovascular Disease

## 2014-08-31 NOTE — Telephone Encounter (Signed)
130/80 BP today. I spoke with Amy and made her aware that the pt should have discontinued aspirin when he was discharged from the hospital.

## 2014-08-31 NOTE — Telephone Encounter (Signed)
New message      Nurse want to know if patient is supposed to still take 325mg  aspirin or was it stopped?

## 2014-09-07 ENCOUNTER — Telehealth: Payer: Self-pay | Admitting: Cardiovascular Disease

## 2014-09-07 NOTE — Telephone Encounter (Signed)
°  [name] with [facility] would like a verbal order for one more visit for swallowing concerns. Please call and advise.

## 2014-09-07 NOTE — Telephone Encounter (Signed)
Gave verbal order, per Dr. Burt Knack. Also asked them to update neurology with any issues.

## 2014-09-13 ENCOUNTER — Telehealth: Payer: Self-pay | Admitting: Cardiovascular Disease

## 2014-09-13 NOTE — Telephone Encounter (Signed)
New message      Pt has had wt gain of approx 4lbs.within a week----he has missed some dosages of his fluid pills.  No sob but ankles are swollen.  Please advise

## 2014-09-13 NOTE — Telephone Encounter (Signed)
I spoke with Amy and the pt has missed multiple doses of Furosemide and Spironolactone. He has also started smoking again.  I instructed her to have the pt take an extra 40mg  of Furosemide today.  Amy has already educated the pt on the importance of taking his medications, weighing daily and smoking cessation.  She will contact our office with any other questions or concerns.

## 2014-10-18 ENCOUNTER — Emergency Department (HOSPITAL_COMMUNITY): Payer: PRIVATE HEALTH INSURANCE

## 2014-10-18 ENCOUNTER — Inpatient Hospital Stay (HOSPITAL_COMMUNITY)
Admission: EM | Admit: 2014-10-18 | Discharge: 2014-10-22 | DRG: 291 | Disposition: A | Discharge status: Home / Self Care | Payer: PRIVATE HEALTH INSURANCE | Attending: Internal Medicine | Admitting: Internal Medicine

## 2014-10-18 ENCOUNTER — Encounter (HOSPITAL_COMMUNITY): Payer: Self-pay | Admitting: Emergency Medicine

## 2014-10-18 DIAGNOSIS — Z8673 Personal history of transient ischemic attack (TIA), and cerebral infarction without residual deficits: Secondary | ICD-10-CM

## 2014-10-18 DIAGNOSIS — Z923 Personal history of irradiation: Secondary | ICD-10-CM

## 2014-10-18 DIAGNOSIS — I5023 Acute on chronic systolic (congestive) heart failure: Secondary | ICD-10-CM | POA: Diagnosis present

## 2014-10-18 DIAGNOSIS — J811 Chronic pulmonary edema: Secondary | ICD-10-CM

## 2014-10-18 DIAGNOSIS — Z8546 Personal history of malignant neoplasm of prostate: Secondary | ICD-10-CM | POA: Diagnosis not present

## 2014-10-18 DIAGNOSIS — K219 Gastro-esophageal reflux disease without esophagitis: Secondary | ICD-10-CM | POA: Diagnosis present

## 2014-10-18 DIAGNOSIS — Z809 Family history of malignant neoplasm, unspecified: Secondary | ICD-10-CM | POA: Diagnosis not present

## 2014-10-18 DIAGNOSIS — N179 Acute kidney failure, unspecified: Secondary | ICD-10-CM | POA: Diagnosis present

## 2014-10-18 DIAGNOSIS — I5033 Acute on chronic diastolic (congestive) heart failure: Secondary | ICD-10-CM | POA: Diagnosis present

## 2014-10-18 DIAGNOSIS — N189 Chronic kidney disease, unspecified: Secondary | ICD-10-CM

## 2014-10-18 DIAGNOSIS — R0602 Shortness of breath: Secondary | ICD-10-CM | POA: Diagnosis not present

## 2014-10-18 DIAGNOSIS — I509 Heart failure, unspecified: Secondary | ICD-10-CM

## 2014-10-18 DIAGNOSIS — E1122 Type 2 diabetes mellitus with diabetic chronic kidney disease: Secondary | ICD-10-CM | POA: Diagnosis present

## 2014-10-18 DIAGNOSIS — R609 Edema, unspecified: Secondary | ICD-10-CM

## 2014-10-18 DIAGNOSIS — I739 Peripheral vascular disease, unspecified: Secondary | ICD-10-CM | POA: Diagnosis present

## 2014-10-18 DIAGNOSIS — F319 Bipolar disorder, unspecified: Secondary | ICD-10-CM | POA: Diagnosis present

## 2014-10-18 DIAGNOSIS — N183 Chronic kidney disease, stage 3 unspecified: Secondary | ICD-10-CM | POA: Diagnosis present

## 2014-10-18 DIAGNOSIS — I482 Chronic atrial fibrillation, unspecified: Secondary | ICD-10-CM | POA: Diagnosis present

## 2014-10-18 DIAGNOSIS — I5040 Unspecified combined systolic (congestive) and diastolic (congestive) heart failure: Secondary | ICD-10-CM

## 2014-10-18 DIAGNOSIS — Z87891 Personal history of nicotine dependence: Secondary | ICD-10-CM

## 2014-10-18 DIAGNOSIS — E785 Hyperlipidemia, unspecified: Secondary | ICD-10-CM | POA: Diagnosis present

## 2014-10-18 DIAGNOSIS — N400 Enlarged prostate without lower urinary tract symptoms: Secondary | ICD-10-CM | POA: Diagnosis present

## 2014-10-18 DIAGNOSIS — I2510 Atherosclerotic heart disease of native coronary artery without angina pectoris: Secondary | ICD-10-CM | POA: Diagnosis present

## 2014-10-18 DIAGNOSIS — I10 Essential (primary) hypertension: Secondary | ICD-10-CM | POA: Diagnosis present

## 2014-10-18 DIAGNOSIS — I129 Hypertensive chronic kidney disease with stage 1 through stage 4 chronic kidney disease, or unspecified chronic kidney disease: Secondary | ICD-10-CM | POA: Diagnosis present

## 2014-10-18 DIAGNOSIS — Z955 Presence of coronary angioplasty implant and graft: Secondary | ICD-10-CM

## 2014-10-18 DIAGNOSIS — I252 Old myocardial infarction: Secondary | ICD-10-CM | POA: Diagnosis not present

## 2014-10-18 DIAGNOSIS — Z794 Long term (current) use of insulin: Secondary | ICD-10-CM | POA: Diagnosis not present

## 2014-10-18 DIAGNOSIS — Z7901 Long term (current) use of anticoagulants: Secondary | ICD-10-CM | POA: Diagnosis not present

## 2014-10-18 DIAGNOSIS — E1129 Type 2 diabetes mellitus with other diabetic kidney complication: Secondary | ICD-10-CM | POA: Diagnosis present

## 2014-10-18 DIAGNOSIS — J9601 Acute respiratory failure with hypoxia: Secondary | ICD-10-CM | POA: Diagnosis present

## 2014-10-18 LAB — BASIC METABOLIC PANEL
Anion gap: 13 (ref 5–15)
BUN: 15 mg/dL (ref 6–23)
CHLORIDE: 99 meq/L (ref 96–112)
CO2: 26 mEq/L (ref 19–32)
CREATININE: 1.31 mg/dL (ref 0.50–1.35)
Calcium: 9.2 mg/dL (ref 8.4–10.5)
GFR calc Af Amer: 59 mL/min — ABNORMAL LOW (ref >90)
GFR calc non Af Amer: 51 mL/min — ABNORMAL LOW (ref >90)
GLUCOSE: 147 mg/dL — AB (ref 70–99)
Potassium: 4.7 mEq/L (ref 3.7–5.3)
Sodium: 138 mEq/L (ref 137–147)

## 2014-10-18 LAB — CBC
HEMATOCRIT: 40.3 % (ref 39.0–52.0)
HEMOGLOBIN: 13.3 g/dL (ref 13.0–17.0)
MCH: 29.2 pg (ref 26.0–34.0)
MCHC: 33 g/dL (ref 30.0–36.0)
MCV: 88.6 fL (ref 78.0–100.0)
Platelets: 184 10*3/uL (ref 150–400)
RBC: 4.55 MIL/uL (ref 4.22–5.81)
RDW: 15.3 % (ref 11.5–15.5)
WBC: 8.9 10*3/uL (ref 4.0–10.5)

## 2014-10-18 LAB — GLUCOSE, CAPILLARY
GLUCOSE-CAPILLARY: 245 mg/dL — AB (ref 70–99)
Glucose-Capillary: 221 mg/dL — ABNORMAL HIGH (ref 70–99)

## 2014-10-18 LAB — TSH: TSH: 0.717 u[IU]/mL (ref 0.350–4.500)

## 2014-10-18 LAB — CBG MONITORING, ED: Glucose-Capillary: 155 mg/dL — ABNORMAL HIGH (ref 70–99)

## 2014-10-18 LAB — TROPONIN I

## 2014-10-18 LAB — PRO B NATRIURETIC PEPTIDE: Pro B Natriuretic peptide (BNP): 12301 pg/mL — ABNORMAL HIGH (ref 0–450)

## 2014-10-18 LAB — I-STAT TROPONIN, ED: Troponin i, poc: 0.07 ng/mL (ref 0.00–0.08)

## 2014-10-18 LAB — LACTIC ACID, PLASMA: LACTIC ACID, VENOUS: 2.6 mmol/L — AB (ref 0.5–2.2)

## 2014-10-18 MED ORDER — SPIRONOLACTONE 25 MG PO TABS
25.0000 mg | ORAL_TABLET | Freq: Every day | ORAL | Status: DC
  Administered 2014-10-18 – 2014-10-19 (×2): 25 mg via ORAL
  Filled 2014-10-18 (×2): qty 1

## 2014-10-18 MED ORDER — PANTOPRAZOLE SODIUM 20 MG PO TBEC
20.0000 mg | DELAYED_RELEASE_TABLET | Freq: Every day | ORAL | Status: DC
Start: 2014-10-18 — End: 2014-10-22
  Administered 2014-10-18 – 2014-10-22 (×5): 20 mg via ORAL
  Filled 2014-10-18 (×5): qty 1

## 2014-10-18 MED ORDER — DIVALPROEX SODIUM 500 MG PO DR TAB
500.0000 mg | DELAYED_RELEASE_TABLET | Freq: Two times a day (BID) | ORAL | Status: DC
  Administered 2014-10-18 – 2014-10-22 (×8): 500 mg via ORAL
  Filled 2014-10-18 (×9): qty 1

## 2014-10-18 MED ORDER — ATORVASTATIN CALCIUM 40 MG PO TABS
40.0000 mg | ORAL_TABLET | Freq: Every day | ORAL | Status: DC
  Administered 2014-10-18 – 2014-10-22 (×5): 40 mg via ORAL
  Filled 2014-10-18 (×5): qty 1

## 2014-10-18 MED ORDER — FUROSEMIDE 10 MG/ML IJ SOLN
40.0000 mg | Freq: Two times a day (BID) | INTRAMUSCULAR | Status: DC
  Administered 2014-10-19 – 2014-10-20 (×3): 40 mg via INTRAVENOUS
  Filled 2014-10-18 (×4): qty 4

## 2014-10-18 MED ORDER — VANCOMYCIN HCL 10 G IV SOLR
1750.0000 mg | Freq: Once | INTRAVENOUS | Status: AC
  Administered 2014-10-19: 1750 mg via INTRAVENOUS
  Filled 2014-10-18: qty 1750

## 2014-10-18 MED ORDER — APIXABAN 5 MG PO TABS
5.0000 mg | ORAL_TABLET | Freq: Two times a day (BID) | ORAL | Status: DC
Start: 2014-10-18 — End: 2014-10-22
  Administered 2014-10-18 – 2014-10-22 (×8): 5 mg via ORAL
  Filled 2014-10-18 (×10): qty 1

## 2014-10-18 MED ORDER — FINASTERIDE 5 MG PO TABS
5.0000 mg | ORAL_TABLET | Freq: Every day | ORAL | Status: DC
Start: 2014-10-18 — End: 2014-10-22
  Administered 2014-10-18 – 2014-10-22 (×5): 5 mg via ORAL
  Filled 2014-10-18 (×5): qty 1

## 2014-10-18 MED ORDER — FUROSEMIDE 10 MG/ML IJ SOLN
40.0000 mg | Freq: Once | INTRAMUSCULAR | Status: AC
  Administered 2014-10-18: 40 mg via INTRAVENOUS
  Filled 2014-10-18: qty 4

## 2014-10-18 MED ORDER — INSULIN GLARGINE 100 UNIT/ML ~~LOC~~ SOLN
40.0000 [IU] | Freq: Every day | SUBCUTANEOUS | Status: DC
  Administered 2014-10-18 – 2014-10-19 (×2): 40 [IU] via SUBCUTANEOUS
  Filled 2014-10-18 (×3): qty 0.4

## 2014-10-18 MED ORDER — SODIUM CHLORIDE 0.9 % IV SOLN: 250.0000 mL | INTRAVENOUS | Status: DC | PRN

## 2014-10-18 MED ORDER — VANCOMYCIN HCL IN DEXTROSE 1-5 GM/200ML-% IV SOLN
1000.0000 mg | Freq: Once | INTRAVENOUS | Status: DC
Start: 2014-10-18 — End: 2014-10-18

## 2014-10-18 MED ORDER — DEXTROSE 5 % IV SOLN
2.0000 g | Freq: Once | INTRAVENOUS | Status: AC
  Administered 2014-10-18: 2 g via INTRAVENOUS
  Filled 2014-10-18: qty 2

## 2014-10-18 MED ORDER — CILOSTAZOL 50 MG PO TABS
50.0000 mg | ORAL_TABLET | Freq: Two times a day (BID) | ORAL | Status: DC
  Administered 2014-10-18 – 2014-10-22 (×8): 50 mg via ORAL
  Filled 2014-10-18 (×9): qty 1

## 2014-10-18 MED ORDER — OXYCODONE HCL 5 MG PO TABS: 5.0000 mg | ORAL_TABLET | ORAL | Status: DC | PRN

## 2014-10-18 MED ORDER — SODIUM CHLORIDE 0.9 % IJ SOLN
3.0000 mL | Freq: Two times a day (BID) | INTRAMUSCULAR | Status: DC
  Administered 2014-10-18 – 2014-10-22 (×8): 3 mL via INTRAVENOUS

## 2014-10-18 MED ORDER — INSULIN ASPART 100 UNIT/ML ~~LOC~~ SOLN
0.0000 [IU] | Freq: Three times a day (TID) | SUBCUTANEOUS | Status: DC
  Administered 2014-10-19 (×3): 3 [IU] via SUBCUTANEOUS
  Administered 2014-10-20: 0.15 [IU] via SUBCUTANEOUS
  Administered 2014-10-20 – 2014-10-21 (×3): 5 [IU] via SUBCUTANEOUS
  Administered 2014-10-21: 3 [IU] via SUBCUTANEOUS
  Administered 2014-10-22: 8 [IU] via SUBCUTANEOUS

## 2014-10-18 MED ORDER — MORPHINE SULFATE 2 MG/ML IJ SOLN: 1.0000 mg | INTRAMUSCULAR | Status: DC | PRN

## 2014-10-18 MED ORDER — TRAVOPROST 0.004 % OP SOLN
1.0000 [drp] | Freq: Every day | OPHTHALMIC | Status: DC
  Administered 2014-10-18 – 2014-10-21 (×4): 1 [drp] via OPHTHALMIC
  Filled 2014-10-18: qty 5

## 2014-10-18 MED ORDER — TAMSULOSIN HCL 0.4 MG PO CAPS
0.4000 mg | ORAL_CAPSULE | Freq: Every day | ORAL | Status: DC
Start: 2014-10-18 — End: 2014-10-22
  Administered 2014-10-18 – 2014-10-22 (×5): 0.4 mg via ORAL
  Filled 2014-10-18 (×5): qty 1

## 2014-10-18 MED ORDER — ONDANSETRON HCL 4 MG/2ML IJ SOLN: 4.0000 mg | Freq: Four times a day (QID) | INTRAMUSCULAR | Status: DC | PRN

## 2014-10-18 MED ORDER — ALBUTEROL SULFATE (2.5 MG/3ML) 0.083% IN NEBU
2.5000 mg | INHALATION_SOLUTION | RESPIRATORY_TRACT | Status: DC | PRN
  Filled 2014-10-18: qty 3

## 2014-10-18 MED ORDER — SODIUM CHLORIDE 0.9 % IJ SOLN
3.0000 mL | Freq: Two times a day (BID) | INTRAMUSCULAR | Status: DC
  Administered 2014-10-19 – 2014-10-21 (×6): 3 mL via INTRAVENOUS

## 2014-10-18 MED ORDER — VANCOMYCIN HCL IN DEXTROSE 1-5 GM/200ML-% IV SOLN
1000.0000 mg | Freq: Two times a day (BID) | INTRAVENOUS | Status: DC
  Filled 2014-10-18 (×2): qty 200

## 2014-10-18 MED ORDER — ALBUTEROL SULFATE (2.5 MG/3ML) 0.083% IN NEBU: 2.5000 mg | INHALATION_SOLUTION | RESPIRATORY_TRACT | Status: DC | PRN

## 2014-10-18 MED ORDER — ONDANSETRON HCL 4 MG PO TABS: 4.0000 mg | ORAL_TABLET | Freq: Four times a day (QID) | ORAL | Status: DC | PRN

## 2014-10-18 MED ORDER — NITROGLYCERIN IN D5W 200-5 MCG/ML-% IV SOLN
0.0000 ug/min | INTRAVENOUS | Status: DC
Start: 2014-10-18 — End: 2014-10-18
  Administered 2014-10-18: 5 ug/min via INTRAVENOUS
  Filled 2014-10-18: qty 250

## 2014-10-18 MED ORDER — ACETAMINOPHEN 325 MG PO TABS: 650.0000 mg | ORAL_TABLET | Freq: Four times a day (QID) | ORAL | Status: DC | PRN

## 2014-10-18 MED ORDER — INSULIN ASPART 100 UNIT/ML ~~LOC~~ SOLN
0.0000 [IU] | Freq: Every day | SUBCUTANEOUS | Status: DC
  Administered 2014-10-18 – 2014-10-21 (×2): 2 [IU] via SUBCUTANEOUS

## 2014-10-18 MED ORDER — DEXTROSE 5 % IV SOLN
1.0000 g | Freq: Three times a day (TID) | INTRAVENOUS | Status: DC
  Administered 2014-10-18 – 2014-10-19 (×2): 1 g via INTRAVENOUS
  Filled 2014-10-18 (×4): qty 1

## 2014-10-18 MED ORDER — SODIUM CHLORIDE 0.9 % IJ SOLN
3.0000 mL | INTRAMUSCULAR | Status: DC | PRN
Start: 2014-10-18 — End: 2014-10-22

## 2014-10-18 MED ORDER — CETYLPYRIDINIUM CHLORIDE 0.05 % MT LIQD
7.0000 mL | Freq: Two times a day (BID) | OROMUCOSAL | Status: DC
  Administered 2014-10-19 – 2014-10-22 (×7): 7 mL via OROMUCOSAL

## 2014-10-18 MED ORDER — ACETAMINOPHEN 650 MG RE SUPP: 650.0000 mg | Freq: Four times a day (QID) | RECTAL | Status: DC | PRN

## 2014-10-18 MED ORDER — ZOLPIDEM TARTRATE 5 MG PO TABS: 5.0000 mg | ORAL_TABLET | Freq: Every evening | ORAL | Status: DC | PRN

## 2014-10-18 MED ORDER — ALBUTEROL SULFATE (2.5 MG/3ML) 0.083% IN NEBU
2.5000 mg | INHALATION_SOLUTION | Freq: Four times a day (QID) | RESPIRATORY_TRACT | Status: DC
  Administered 2014-10-18: 2.5 mg via RESPIRATORY_TRACT

## 2014-10-18 MED ORDER — METOPROLOL TARTRATE 50 MG PO TABS
50.0000 mg | ORAL_TABLET | Freq: Two times a day (BID) | ORAL | Status: DC
  Administered 2014-10-18 – 2014-10-22 (×8): 50 mg via ORAL
  Filled 2014-10-18 (×9): qty 1

## 2014-10-18 MED ORDER — DEXTROSE 5 % IV SOLN
2.0000 g | Freq: Three times a day (TID) | INTRAVENOUS | Status: DC
  Filled 2014-10-18: qty 2

## 2014-10-18 MED ORDER — NITROGLYCERIN 2 % TD OINT
1.0000 [in_us] | TOPICAL_OINTMENT | Freq: Four times a day (QID) | TRANSDERMAL | Status: DC
  Administered 2014-10-19 – 2014-10-20 (×6): 1 [in_us] via TOPICAL
  Filled 2014-10-18: qty 30

## 2014-10-18 MED ORDER — NITROGLYCERIN 2 % TD OINT
1.0000 [in_us] | TOPICAL_OINTMENT | Freq: Once | TRANSDERMAL | Status: AC
  Administered 2014-10-18: 1 [in_us] via TOPICAL
  Filled 2014-10-18: qty 1

## 2014-10-18 NOTE — Progress Notes (Signed)
Pt arrived to floor in NAD, pt placed in bed. VS taken, BP elevated. Pt given metoprolol PO per scheduled dose and nighttime medicine. Pt oriented to room and floor. Pt states he wants to go to sleep. Will continue to monitor. Ronnette Hila, RN

## 2014-10-18 NOTE — ED Notes (Signed)
MD at bedside. 

## 2014-10-18 NOTE — Progress Notes (Signed)
ANTIBIOTIC CONSULT NOTE - INITIAL  Pharmacy Consult for Aztreonam/Vancomycin Indication: HCAP  Allergies  Allergen Reactions  . Penicillins Other (See Comments)     hands and feet edema   Vital Signs: Temp: 97.8 F (36.6 C) (10/22 1721) Temp Source: Axillary (10/22 1721) BP: 171/96 mmHg (10/22 1931) Pulse Rate: 103 (10/22 1931)  Labs:  Recent Labs  10/18/14 1743  WBC 8.9  HGB 13.3  PLT 184  CREATININE 1.31   The CrCl is unknown because both a height and weight (above a minimum accepted value) are required for this calculation. No results found for this basename: VANCOTROUGH, VANCOPEAK, VANCORANDOM, GENTTROUGH, GENTPEAK, GENTRANDOM, TOBRATROUGH, TOBRAPEAK, TOBRARND, AMIKACINPEAK, AMIKACINTROU, AMIKACIN,  in the last 72 hours   Microbiology: No results found for this or any previous visit (from the past 720 hour(s)).  Medical History: Past Medical History  Diagnosis Date  . Hyperlipidemia   . HTN (hypertension)     echo 2/11: EF 50-55%, diast dyfxn, severe LVH, inf HK, LAE  . CAD (coronary artery disease)     a.  NSTEMI treated with PCI Feb 2011 with a DES.([facility]);   b. cath 2/11: D1 stents x 2 ok, AV groove CFX occluded with dist AV CFX filled by L-L collats, RCA occluded, OM2 90-95% (treated with PCI)  . MI (myocardial infarction)   . PVD (peripheral vascular disease)   . DM (diabetes mellitus)   . GERD (gastroesophageal reflux disease)   . Bipolar disorder   . Prostate cancer     status post radiation therapy in 2003  . Pulmonary edema   . Atrial fibrillation   . Respiratory difficulty 06/24/2014    intubated   . Hypertensive emergency 06/24/2014  . CKD (chronic kidney disease), stage III    Assessment: 78 y/o M w/ SOB. Afebrile, BP 171/96, no leukocytosis. CXR shows possible pneumonia. Empiric antibiotics for HCAP. Hx of penicillin allergy.  Blood cx-->   Goal of Therapy:  Vancomycin trough 15-20 mcg/mL  Plan:  Aztreonam IV 2g q8h Vancomycin IV  1750mg  x 1, then Vancomycin 1000 mg q12h F/u vancomycin trough, renal function, cultures and susceptibilities   Carl Best 10/18/2014,7:33 PM   Note has been reviewed, I agree with the assessment and plan.   Hughes Better, PharmD, BCPS Clinical Pharmacist Pager: [phone] 10/18/2014 7:56 PM

## 2014-10-18 NOTE — ED Notes (Signed)
Per EMS- pt states that he has had increasing SOB since yesterday. Pt called PCP and was told to take increased lasix dose. Pt took 80mg  as instructed instead of 40mg  after talking to PCP. Pt SOB continued to increase. Pt also noted to have swelling to bilateral lowers extremities left greater than right. Pt received a total of 10mg  albuterol and .5mg  of atrovent with EMS

## 2014-10-18 NOTE — ED Provider Notes (Signed)
CSN: [id number]     Arrival date & time 10/18/14  1711 History   First MD Initiated Contact with Patient 10/18/14 1712     Chief Complaint  Patient presents with  . Shortness of Breath  . Leg Swelling     (Consider location/radiation/quality/duration/timing/severity/associated sxs/prior Treatment) Patient is a 78 y.o. male presenting with shortness of breath. The history is provided by the patient and the EMS personnel. No language interpreter was used.  Shortness of Breath Severity:  Severe Onset quality:  Gradual Duration:  2 days Timing:  Constant Progression:  Worsening Chronicity:  Recurrent Context: not URI   Relieved by:  Inhaler Worsened by:  Exertion and movement Ineffective treatments:  Diuretics (increased lasix to 80 mg from 40 mg today) Associated symptoms: wheezing   Associated symptoms: no abdominal pain, no chest pain, no cough, no diaphoresis, no fever, no hemoptysis, no sputum production, no syncope and no vomiting   Associated symptoms comment:  Increase LE edema Risk factors: hx of cancer (prostate)   Risk factors: no hx of PE/DVT, no recent surgery and no tobacco use (former)     Past Medical History  Diagnosis Date  . Hyperlipidemia   . HTN (hypertension)     echo 2/11: EF 50-55%, diast dyfxn, severe LVH, inf HK, LAE  . CAD (coronary artery disease)     a.  NSTEMI treated with PCI Feb 2011 with a DES.([facility]deavor);   b. cath 2/11: D1 stents x 2 ok, AV groove CFX occluded with dist AV CFX filled by L-L collats, RCA occluded, OM2 90-95% (treated with PCI)  . MI (myocardial infarction)   . PVD (peripheral vascular disease)   . DM (diabetes mellitus)   . GERD (gastroesophageal reflux disease)   . Bipolar disorder   . Prostate cancer     status post radiation therapy in 2003  . Pulmonary edema   . Atrial fibrillation   . Respiratory difficulty 06/24/2014    intubated   . Hypertensive emergency 06/24/2014  . CKD (chronic kidney disease), stage III     Past Surgical History  Procedure Laterality Date  . Femoral bypass  2003   Family History  Problem Relation Age of Onset  . Cancer Mother   . Cancer Father   . Cancer Sister    History  Substance Use Topics  . Smoking status: Former Smoker -- 0.50 packs/day for 50 years    Types: Cigarettes    Quit date: 06/30/2014  . Smokeless tobacco: Not on file     Comment: He has a 50-pack-year hx of tobacco abuse. Currently, smoking about half a pack a day.  . Alcohol Use: Yes     Comment: Previously drank heavily, and now has a drink every other day or so.    Review of Systems  Constitutional: Negative for fever and diaphoresis.  Respiratory: Positive for shortness of breath and wheezing. Negative for cough, hemoptysis, sputum production and stridor.   Cardiovascular: Positive for leg swelling. Negative for chest pain, palpitations and syncope.  Gastrointestinal: Negative for nausea, vomiting, abdominal pain and abdominal distention.  Genitourinary: Negative for decreased urine volume.  Musculoskeletal: Negative for arthralgias and myalgias.  Hematological: Bruises/bleeds easily.  All other systems reviewed and are negative.     Allergies  Penicillins  Home Medications   Prior to Admission medications   Medication Sig Start Date End Date Taking? Authorizing Provider  albuterol (PROVENTIL HFA;VENTOLIN HFA) 108 (90 BASE) MCG/ACT inhaler Inhale 2 puffs into the lungs every 6 (  six) hours as needed for shortness of breath.    Historical Provider, MD  apixaban (ELIQUIS) 5 MG TABS tablet Take 1 tablet (5 mg total) by mouth 2 (two) times daily. 08/28/14   Thurnell Lose, MD  atorvastatin (LIPITOR) 40 MG tablet Take 1 tablet (40 mg total) by mouth daily. 08/01/14   Sherren Mocha, MD  cilostazol (PLETAL) 50 MG tablet Take 50 mg by mouth 2 (two) times daily.    Historical Provider, MD  divalproex (DEPAKOTE) 250 MG EC tablet Take 500 mg by mouth 2 (two) times daily.     Historical Provider,  MD  finasteride (PROSCAR) 5 MG tablet Take 5 mg by mouth daily.      Historical Provider, MD  furosemide (LASIX) 40 MG tablet Take 40 mg by mouth 2 (two) times daily.  06/29/14   Debbe Odea, MD  gabapentin (NEURONTIN) 300 MG capsule Take 300-600 mg by mouth 2 (two) times daily. 600mg  in the morning and 300mg  in the evening    Historical Provider, MD  insulin glargine (LANTUS) 100 UNIT/ML injection Inject 40 Units into the skin daily. 07/20/14   Geradine Girt, DO  insulin regular (HUMULIN R,NOVOLIN R) 100 units/mL injection Inject 8 Units into the skin 2 (two) times daily before a meal.     Historical Provider, MD  metoprolol (LOPRESSOR) 50 MG tablet Take 1 tablet (50 mg total) by mouth 2 (two) times daily. 08/14/14   Sherren Mocha, MD  pantoprazole (PROTONIX) 20 MG tablet Take 20 mg by mouth daily.    Historical Provider, MD  sildenafil (VIAGRA) 100 MG tablet Take 100 mg by mouth daily as needed for erectile dysfunction.    Historical Provider, MD  spironolactone (ALDACTONE) 25 MG tablet Take 25 mg by mouth daily.    Historical Provider, MD  Tamsulosin HCl (FLOMAX) 0.4 MG CAPS Take 0.4 mg by mouth daily.      Historical Provider, MD  travoprost, benzalkonium, (TRAVATAN) 0.004 % ophthalmic solution Place 1 drop into both eyes at bedtime.      Historical Provider, MD   BP 179/92  Pulse 97  Temp(Src) 97.8 F (36.6 C) (Axillary)  Resp 24  SpO2 100% Physical Exam  Vitals reviewed. Constitutional: He is oriented to person, place, and time. He appears well-developed and well-nourished. No distress.  HENT:  Head: Normocephalic.  Mouth/Throat: Oropharynx is clear and moist.  Neck: No JVD present. No tracheal deviation present.  Cardiovascular: Normal rate, regular rhythm, normal heart sounds and intact distal pulses.   Pulmonary/Chest: No stridor. Tachypnea noted. No respiratory distress. He has decreased breath sounds. He has wheezes. He has rales.  Abdominal: Soft. Bowel sounds are normal. He  exhibits no distension. There is no tenderness.  Musculoskeletal: He exhibits edema (L>R). He exhibits no tenderness.  Neurological: He is alert and oriented to person, place, and time.  Skin: Skin is warm.    ED Course  Procedures (including critical care time) Labs Review Labs Reviewed  BASIC METABOLIC PANEL - Abnormal; Notable for the following:    Glucose, Bld 147 (*)    GFR calc non Af Amer 51 (*)    GFR calc Af Amer 59 (*)    All other components within normal limits  PRO B NATRIURETIC PEPTIDE - Abnormal; Notable for the following:    Pro B Natriuretic peptide (BNP) 12301.0 (*)    All other components within normal limits  GLUCOSE, CAPILLARY - Abnormal; Notable for the following:    Glucose-Capillary 221 (*)  All other components within normal limits  CBG MONITORING, ED - Abnormal; Notable for the following:    Glucose-Capillary 155 (*)    All other components within normal limits  CULTURE, BLOOD (ROUTINE X 2)  CULTURE, BLOOD (ROUTINE X 2)  CULTURE, EXPECTORATED SPUTUM-ASSESSMENT  GRAM STAIN  CBC  LACTIC ACID, PLASMA  BASIC METABOLIC PANEL  CBC  TROPONIN I  TROPONIN I  TROPONIN I  TSH  HIV ANTIBODY (ROUTINE TESTING)  LEGIONELLA ANTIGEN, URINE  STREP PNEUMONIAE URINARY ANTIGEN  I-STAT TROPOININ, ED  I-STAT TROPOININ, ED    Imaging Review Dg Chest 2 View (if Patient Has Fever And/or Copd)  10/18/2014   CLINICAL DATA:  SHORTNESS OF BREATH LEG SWELLING SHORTNESS OF BREATH LEG SWELLING Comments: pt states that he has had increasing SOB since yesterday  EXAM: CHEST  2 VIEW  COMPARISON:  Radiograph 829 1,015  FINDINGS: Mildly enlarged cardiac silhouette. There is peripherally airspace disease in the left lower lobe which is new from prior. Bilateral small effusions. There is central venous pulmonary congestion. There is mild interstitial edema.  IMPRESSION: Opacity in the lateral left hemi thorax is increased compared to prior. This either represents pneumonia or  loculated pleural fluid.   Electronically Signed   By: Suzy Bouchard M.D.   On: 10/18/2014 18:21     EKG Interpretation   Date/Time:  Thursday October 18 2014 17:23:06 EDT Ventricular Rate:  95 PR Interval:    QRS Duration: 149 QT Interval:  423 QTC Calculation: 532 R Axis:   -149 Text Interpretation:  Age not entered, assumed to be  78 years old for  purpose of ECG interpretation Atrial fibrillation Right bundle branch  block since last tracing no significant change Confirmed by MILLER  MD,  BRIAN ([id number]) on 10/18/2014 5:27:33 PM      MDM   Final diagnoses:  None   78 y/o male with h/o A fib on eliquis, CAD HTN, PVD, DM, CHF presenting with SOB. Began last night, increased water intake recently. No cough, chest pain, sputum production. Hypertensive on arrival. Sats 99% on RA without increased work of breathing. Exam notable for bilateral rales and L> R LE edema (h/o venous harvest on L). No JVD. Consistent with CHF exacerbation. Given asymmetric swelling chronic and s/s consistent w/ CHF, PE less likely. Will start nitro ggt, lasix 40 mg IV. Labs and CXR. EKG: a fib, rate 95, unchanged from prior. Will likely need admit for diuresis.   BNP elevated. CXR with ? L lobe pna vs loculated effusion. Pt still denies productive cough, fever. Will cover for HCAP given recent admission. BP 160s on 5 mcg nitro, will transition to paste.  Admitted to Hospitalist service.      Amparo Bristol, MD 10/18/14 2236

## 2014-10-18 NOTE — ED Provider Notes (Signed)
78 year old male, history of congestive heart failure, presents with shortness of breath and increased swelling of the legs, recently had increase in his Lasix, symptoms persist despite increased medications. On exam the patient has bilateral peripheral lower extremity edema which is asymmetric, left greater than right. He has rales with inspiration bilaterally but does not appear in respiratory distress. He is not tachycardic but has an irregularly irregular rhythm consistent with his history of atrial fibrillation. JVD is not seen on exam. Vital signs reflect hypertension, with his history of congestive heart failure I suspect a congestive heart failure exacerbation and given his blood pressure and rales on exam with fluid overload status she will likely need to be admitted for ongoing diuresis.   EKG Interpretation  Date/Time:  Thursday October 18 2014 17:23:06 EDT Ventricular Rate:  95 PR Interval:    QRS Duration: 149 QT Interval:  423 QTC Calculation: 532 R Axis:   -149 Text Interpretation:  Age not entered, assumed to be  78 years old for purpose of ECG interpretation Atrial fibrillation Right bundle branch block since last tracing no significant change Confirmed by Loye Vento  MD, Hailee Hollick ([id number]) on 10/18/2014 5:27:33 PM      I saw and evaluated the patient, reviewed the resident's note and I agree with the findings and plan.   Final diagnoses:  Congestive heart failure  Combined systolic and diastolic congestive heart failure, unspecified congestive heart failure chronicity  Chronic atrial fibrillation  CKD (chronic kidney disease), stage III  Type 2 diabetes mellitus with diabetic chronic kidney disease  Edema      Johnna Acosta, MD 10/19/14 [phone]

## 2014-10-18 NOTE — H&P (Signed)
[facility]                                                                                    Patient Demographics  [name], is a 78 y.o. male  CSN: [id number]  MRN: [id number]  DOB - 31-Mar-1936  Admit Date - 10/18/2014  Outpatient Primary MD for the patient is Jilda Panda, MD   With History of -  Past Medical History  Diagnosis Date  . Hyperlipidemia   . HTN (hypertension)     echo 2/11: EF 50-55%, diast dyfxn, severe LVH, inf HK, LAE  . CAD (coronary artery disease)     a.  NSTEMI treated with PCI Feb 2011 with a DES.([facility]);   b. cath 2/11: D1 stents x 2 ok, AV groove CFX occluded with dist AV CFX filled by L-L collats, RCA occluded, OM2 90-95% (treated with PCI)  . MI (myocardial infarction)   . PVD (peripheral vascular disease)   . DM (diabetes mellitus)   . GERD (gastroesophageal reflux disease)   . Bipolar disorder   . Prostate cancer     status post radiation therapy in 2003  . Pulmonary edema   . Atrial fibrillation   . Respiratory difficulty 06/24/2014    intubated   . Hypertensive emergency 06/24/2014  . CKD (chronic kidney disease), stage III       Past Surgical History  Procedure Laterality Date  . Femoral bypass  2003    in for   Chief Complaint  Patient presents with  . Shortness of Breath  . Leg Swelling     HPI  [name]  is a 78 y.o. male, with this medical history significant for congestive heart failure, hypertension and coronary artery disease status post MI status post stent placement presenting with 2 days history of shortness of breath with no chest pains, fever or chills. The patient was in the hospital on one month ago. Patient has history of chronic swelling of the lower extremities. No history of nausea, vomiting or diarrhea. Patient came from home. Patient also has history of atrial fib and is on chronic anticoagulation.    Review of Systems    In addition to the HPI above,  No Fever-chills, No  Headache, No changes with Vision or hearing, No problems swallowing food or Liquids, No Abdominal pain, No Nausea or Vommitting, Bowel movements are regular, No Blood in stool or Urine, No dysuria, No new skin rashes or bruises, No new joints pains-aches,  No new weakness, tingling, numbness in any extremity, No recent weight gain or loss, No polyuria, polydypsia or polyphagia, No significant Mental Stressors.  A full 10 point Review of Systems was done, except as stated above, all other Review of Systems were negative.   Social History History  Substance Use Topics  . Smoking status: Former Smoker -- 0.50 packs/day for 50 years    Types: Cigarettes    Quit date: 06/30/2014  . Smokeless tobacco: Not on file     Comment: He has a 50-pack-year hx of tobacco abuse. Currently, smoking about half a pack a day.  . Alcohol Use: Yes     Comment: Previously drank heavily,  and now has a drink every other day or so.     Family History Family History  Problem Relation Age of Onset  . Cancer Mother   . Cancer Father   . Cancer Sister      Prior to Admission medications   Medication Sig Start Date End Date Taking? Authorizing Provider  albuterol (PROVENTIL HFA;VENTOLIN HFA) 108 (90 BASE) MCG/ACT inhaler Inhale 2 puffs into the lungs every 6 (six) hours as needed for shortness of breath.   Yes Historical Provider, MD  apixaban (ELIQUIS) 5 MG TABS tablet Take 1 tablet (5 mg total) by mouth 2 (two) times daily. 08/28/14  Yes Thurnell Lose, MD  atorvastatin (LIPITOR) 40 MG tablet Take 1 tablet (40 mg total) by mouth daily. 08/01/14  Yes Sherren Mocha, MD  cilostazol (PLETAL) 50 MG tablet Take 50 mg by mouth 2 (two) times daily.   Yes Historical Provider, MD  divalproex (DEPAKOTE) 250 MG EC tablet Take 500 mg by mouth 2 (two) times daily.    Yes Historical Provider, MD  finasteride (PROSCAR) 5 MG tablet Take 5 mg by mouth daily.     Yes Historical Provider, MD  furosemide (LASIX) 40 MG  tablet Take 40 mg by mouth 2 (two) times daily.  06/29/14  Yes Debbe Odea, MD  gabapentin (NEURONTIN) 300 MG capsule Take 300-600 mg by mouth 2 (two) times daily. 600mg  in the morning and 300mg  in the evening   Yes Historical Provider, MD  insulin glargine (LANTUS) 100 UNIT/ML injection Inject 40 Units into the skin daily. 07/20/14  Yes Geradine Girt, DO  insulin regular (HUMULIN R,NOVOLIN R) 100 units/mL injection Inject 8 Units into the skin 2 (two) times daily before a meal. Per sliding scale   Yes Historical Provider, MD  metoprolol (LOPRESSOR) 50 MG tablet Take 1 tablet (50 mg total) by mouth 2 (two) times daily. 08/14/14  Yes Sherren Mocha, MD  pantoprazole (PROTONIX) 20 MG tablet Take 20 mg by mouth daily.   Yes Historical Provider, MD  spironolactone (ALDACTONE) 25 MG tablet Take 25 mg by mouth daily.   Yes Historical Provider, MD  Tamsulosin HCl (FLOMAX) 0.4 MG CAPS Take 0.4 mg by mouth daily.     Yes Historical Provider, MD  travoprost, benzalkonium, (TRAVATAN) 0.004 % ophthalmic solution Place 1 drop into both eyes at bedtime.    Yes Historical Provider, MD  sildenafil (VIAGRA) 100 MG tablet Take 100 mg by mouth daily as needed for erectile dysfunction.    Historical Provider, MD    Allergies  Allergen Reactions  . Penicillins Other (See Comments)     hands and feet edema    Physical Exam  Vitals  Blood pressure 170/89, pulse 97, temperature 97.8 F (36.6 C), temperature source Axillary, resp. rate 20, height 6' 0.05" (1.83 m), weight 98.9 kg (218 lb 0.6 oz), SpO2 96.00%.   1. General elderly gentleman, well-developed, well-nourished in moderate shortness of breath  2. Normal affect and insight, Not Suicidal or Homicidal, Awake Alert, Oriented X 3.  3. No F.N deficits grossly, ALL C.Nerves Intact,   4. Ears and Eyes appear Normal, Conjunctivae clear, PERRLA. Moist Oral Mucosa.  5. Supple Neck, No JVD, No cervical lymphadenopathy appriciated, No Carotid Bruits.  6.  Symmetrical Chest wall movement, decreased breath sounds bilaterally especially at the bases.  7. irregularly irregular, No Gallops, Rubs or Murmurs, No Parasternal Heave.  8. Positive Bowel Sounds, Abdomen Soft, Non tender, No organomegaly appriciated,No rebound -guarding or rigidity.  9.  No Cyanosis, Normal Skin Turgor, +3 edema lower extremities.  10. Good muscle tone, .      Data Review  CBC  Recent Labs Lab 10/18/14 1743  WBC 8.9  HGB 13.3  HCT 40.3  PLT 184  MCV 88.6  MCH 29.2  MCHC 33.0  RDW 15.3   ------------------------------------------------------------------------------------------------------------------  Chemistries   Recent Labs Lab 10/18/14 1743  NA 138  K 4.7  CL 99  CO2 26  GLUCOSE 147*  BUN 15  CREATININE 1.31  CALCIUM 9.2   ------------------------------------------------------------------------------------------------------------------ estimated creatinine clearance is 58.5 ml/min (by C-G formula based on Cr of 1.31). ------------------------------------------------------------------------------------------------------------------ No results found for this basename: TSH, T4TOTAL, FREET3, T3FREE, THYROIDAB,  in the last 72 hours   Coagulation profile No results found for this basename: INR, PROTIME,  in the last 168 hours ------------------------------------------------------------------------------------------------------------------- No results found for this basename: DDIMER,  in the last 72 hours -------------------------------------------------------------------------------------------------------------------  Cardiac Enzymes No results found for this basename: CK, CKMB, TROPONINI, MYOGLOBIN,  in the last 168 hours ------------------------------------------------------------------------------------------------------------------ No components found with this basename: POCBNP,     Imaging results:   Dg Chest 2 View (if  Patient Has Fever And/or Copd)  10/18/2014   CLINICAL DATA:  SHORTNESS OF BREATH LEG SWELLING SHORTNESS OF BREATH LEG SWELLING Comments: pt states that he has had increasing SOB since yesterday  EXAM: CHEST  2 VIEW  COMPARISON:  Radiograph 829 1,015  FINDINGS: Mildly enlarged cardiac silhouette. There is peripherally airspace disease in the left lower lobe which is new from prior. Bilateral small effusions. There is central venous pulmonary congestion. There is mild interstitial edema.  IMPRESSION: Opacity in the lateral left hemi thorax is increased compared to prior. This either represents pneumonia or loculated pleural fluid.   Electronically Signed   By: Suzy Bouchard M.D.   On: 10/18/2014 18:21    My personal review of EKG: Atrial fib with right bundle branch block at 95 beats per minute    Assessment & Plan  1. congestive heart failure     Echo in 07/2014 shows ejection fraction 40-45%    Change Lasix to IV    Fluid restriction    Serial troponins    Cardiology consult in a.m.  2. hospital-acquired Pneumonia    IV vancomycin and cefepime    Check sputum cultures  3. Chronic renal failure stage III     Monitor creatinine  4. Diabetes mellitus type 2     Insulin sliding scale     Continue Lantus       DVT Prophylaxis Lovenox  AM Labs Ordered, also please review Full Orders  Code Status full  Disposition Plan: Home with home health  Time spent in minutes : 38 minutes  Condition GUARDED   @SIGNATURE @

## 2014-10-19 ENCOUNTER — Other Ambulatory Visit: Payer: Self-pay

## 2014-10-19 ENCOUNTER — Inpatient Hospital Stay (HOSPITAL_COMMUNITY): Payer: PRIVATE HEALTH INSURANCE

## 2014-10-19 DIAGNOSIS — J9601 Acute respiratory failure with hypoxia: Secondary | ICD-10-CM

## 2014-10-19 LAB — GLUCOSE, CAPILLARY
Glucose-Capillary: 196 mg/dL — ABNORMAL HIGH (ref 70–99)
Glucose-Capillary: 178 mg/dL — ABNORMAL HIGH (ref 70–99)
Glucose-Capillary: 192 mg/dL — ABNORMAL HIGH (ref 70–99)
Glucose-Capillary: 156 mg/dL — ABNORMAL HIGH (ref 70–99)

## 2014-10-19 LAB — CBC
HCT: 37.5 % — ABNORMAL LOW (ref 39.0–52.0)
HEMOGLOBIN: 12.1 g/dL — AB (ref 13.0–17.0)
MCH: 28.7 pg (ref 26.0–34.0)
MCHC: 32.3 g/dL (ref 30.0–36.0)
MCV: 89.1 fL (ref 78.0–100.0)
Platelets: 181 10*3/uL (ref 150–400)
RBC: 4.21 MIL/uL — AB (ref 4.22–5.81)
RDW: 15.3 % (ref 11.5–15.5)
WBC: 8.6 10*3/uL (ref 4.0–10.5)

## 2014-10-19 LAB — LEGIONELLA ANTIGEN, URINE

## 2014-10-19 LAB — CLOSTRIDIUM DIFFICILE BY PCR: Toxigenic C. Difficile by PCR: NEGATIVE

## 2014-10-19 LAB — BASIC METABOLIC PANEL
ANION GAP: 12 (ref 5–15)
BUN: 19 mg/dL (ref 6–23)
CHLORIDE: 98 meq/L (ref 96–112)
CO2: 29 mEq/L (ref 19–32)
Calcium: 9.2 mg/dL (ref 8.4–10.5)
Creatinine, Ser: 1.58 mg/dL — ABNORMAL HIGH (ref 0.50–1.35)
GFR, EST AFRICAN AMERICAN: 47 mL/min — AB (ref >90)
GFR, EST NON AFRICAN AMERICAN: 41 mL/min — AB (ref >90)
Glucose, Bld: 231 mg/dL — ABNORMAL HIGH (ref 70–99)
Potassium: 4.4 mEq/L (ref 3.7–5.3)
SODIUM: 139 meq/L (ref 137–147)

## 2014-10-19 LAB — STREP PNEUMONIAE URINARY ANTIGEN: STREP PNEUMO URINARY ANTIGEN: NEGATIVE

## 2014-10-19 LAB — TROPONIN I

## 2014-10-19 LAB — HIV ANTIBODY (ROUTINE TESTING W REFLEX): HIV 1&2 Ab, 4th Generation: NONREACTIVE

## 2014-10-19 MED ORDER — DEXTROSE 5 % IV SOLN
2.0000 g | INTRAVENOUS | Status: DC
  Filled 2014-10-19: qty 2

## 2014-10-19 MED ORDER — GUAIFENESIN-DM 100-10 MG/5ML PO SYRP
5.0000 mL | ORAL_SOLUTION | ORAL | Status: DC | PRN
  Administered 2014-10-19 – 2014-10-22 (×5): 5 mL via ORAL
  Filled 2014-10-19 (×5): qty 5

## 2014-10-19 MED ORDER — INSULIN GLARGINE 100 UNIT/ML ~~LOC~~ SOLN
44.0000 [IU] | Freq: Every day | SUBCUTANEOUS | Status: DC
  Administered 2014-10-20 – 2014-10-22 (×3): 44 [IU] via SUBCUTANEOUS
  Filled 2014-10-19 (×3): qty 0.44

## 2014-10-19 MED ORDER — SPIRONOLACTONE 50 MG PO TABS
50.0000 mg | ORAL_TABLET | Freq: Two times a day (BID) | ORAL | Status: DC
  Administered 2014-10-19 – 2014-10-20 (×2): 50 mg via ORAL
  Filled 2014-10-19 (×4): qty 1

## 2014-10-19 MED ORDER — VANCOMYCIN HCL 10 G IV SOLR
1500.0000 mg | INTRAVENOUS | Status: DC
  Filled 2014-10-19: qty 1500

## 2014-10-19 MED ORDER — IPRATROPIUM-ALBUTEROL 0.5-2.5 (3) MG/3ML IN SOLN
3.0000 mL | Freq: Four times a day (QID) | RESPIRATORY_TRACT | Status: DC | PRN
  Administered 2014-10-19: 3 mL via RESPIRATORY_TRACT
  Filled 2014-10-19: qty 3

## 2014-10-19 NOTE — Progress Notes (Signed)
ANTIBIOTIC CONSULT NOTE - INITIAL  Pharmacy Consult for Cefepime/Vancomycin Indication: HCAP  Allergies  Allergen Reactions  . Penicillins Other (See Comments)     hands and feet edema   Vital Signs: Temp: 99.8 F (37.7 C) (10/23 0524) Temp Source: Oral (10/23 0524) BP: 163/79 mmHg (10/23 0524) Pulse Rate: 98 (10/23 0524)  Labs:  Recent Labs  10/18/14 1743 10/19/14 0237  WBC 8.9  --   HGB 13.3  --   PLT 184  --   CREATININE 1.31 1.58*   Estimated Creatinine Clearance: 48.8 ml/min (by C-G formula based on Cr of 1.58). No results found for this basename: VANCOTROUGH, Corlis Leak, VANCORANDOM, [place], [place], GENTRANDOM, TOBRATROUGH, TOBRAPEAK, TOBRARND, AMIKACINPEAK, AMIKACINTROU, AMIKACIN,  in the last 72 hours   Microbiology: Recent Results (from the past 720 hour(s))  CLOSTRIDIUM DIFFICILE BY PCR     Status: None   Collection Time    10/19/14  2:29 AM      Result Value Ref Range Status   C difficile by pcr NEGATIVE  NEGATIVE Final    Medical History: Past Medical History  Diagnosis Date  . Hyperlipidemia   . HTN (hypertension)     echo 2/11: EF 50-55%, diast dyfxn, severe LVH, inf HK, LAE  . CAD (coronary artery disease)     a.  NSTEMI treated with PCI Feb 2011 with a DES.([facility]);   b. cath 2/11: D1 stents x 2 ok, AV groove CFX occluded with dist AV CFX filled by L-L collats, RCA occluded, OM2 90-95% (treated with PCI)  . MI (myocardial infarction)   . PVD (peripheral vascular disease)   . DM (diabetes mellitus)   . GERD (gastroesophageal reflux disease)   . Bipolar disorder   . Prostate cancer     status post radiation therapy in 2003  . Pulmonary edema   . Atrial fibrillation   . Respiratory difficulty 06/24/2014    intubated   . Hypertensive emergency 06/24/2014  . CKD (chronic kidney disease), stage III    Assessment: [name] w/ SOB. Afebrile, BP 171/96, no leukocytosis. CXR shows possible pneumonia. Empiric antibiotics for HCAP. Hx of  penicillin allergy. Now on vanc/cefepime. His scr bumped slightly so we'll adjust his abx.   Blood cx-->   Goal of Therapy:  Vancomycin trough 15-20 mcg/mL  Plan:   Change vanc to 1.5g IV q24 Change Cefepime to 2g IV q24  Onnie Boer, PharmD Pager: [phone] 10/19/2014 10:57 AM

## 2014-10-19 NOTE — Progress Notes (Addendum)
Pt with multiple watery stools Q15-30 minutes cdiff PCR sent to lab, K Baltazar Najjar notified and new order for rectal tube.  Pt states he wants to wait until later to decide whether to insert rectal tube.

## 2014-10-19 NOTE — Progress Notes (Signed)
[facility] Progress Note   [name] HFW:[id number] DOB: October 26, 1936 DOA: 10/18/2014 PCP: Jilda Panda, MD  Brief narrative: [name] is a 78 y.o. male with chronic systolic heart failure, atrial fibrillation, hypertension and coronary artery disease status post stenting in 2011 and a recent CVA suspected to be secondary to atrial fibrillation -he was discharged on alkalosis on 9/1.  The patient presents with shortness of breath which started about 2 days ago. He also has problems with swelling of his ankles.   Subjective: Patient states his shortness of breath has improved. He feels his pedal edema has improved as well. He does not admit to any recent cough or fevers.  Assessment/Plan: Principal Problem:   Acute respiratory failure with hypoxemia-acute systolic heart failure- EF 40-45% -Most likely secondary to acute on chronic systolic heart failure rather than pneumonia as patient is asymptomatic for pneumonia -Will continue Lasix at 40 mg IV twice a day and Aldactone(increase to 50 BID) and follow I and O and daily weights - recent echo in August 2015 and therefore we will not repeat 1 at this time -He is not on an ACE inhibitor, most likely due 2 CKD 3  -Continue nitro paste  Active Problems:   DM (diabetes mellitus), type 2 with renal complications -Sugars slightly elevated- will increase Lantus  and continue sliding scale    Essential hypertension -Continue current meds and follow    Atrial fibrillation -Continue Eliquis and Metoprolol    CKD (chronic kidney disease), stage III - follow renal function closely with diuresis  BPH -cont flomax and Proscar  CAD/ PVD - not on ASA- will add   Code Status: Full code Family Communication: none Disposition Plan: home DVT prophylaxis: Eliquis  Consultants: none  Procedures: none  Antibiotics: Anti-infectives   Start     Dose/Rate Route Frequency Ordered Stop   10/19/14 2200  vancomycin  (VANCOCIN) 1,500 mg in sodium chloride 0.9 % 500 mL IVPB     1,500 mg 250 mL/hr over 120 Minutes Intravenous Every 24 hours 10/19/14 1055     10/19/14 2200  ceFEPIme (MAXIPIME) 2 g in dextrose 5 % 50 mL IVPB     2 g 100 mL/hr over 30 Minutes Intravenous Every 24 hours 10/19/14 1056     10/19/14 0800  vancomycin (VANCOCIN) IVPB 1000 mg/200 mL premix  Status:  Discontinued     1,000 mg 200 mL/hr over 60 Minutes Intravenous Every 12 hours 10/18/14 1945 10/19/14 1055   10/19/14 0400  aztreonam (AZACTAM) 2 g in dextrose 5 % 50 mL IVPB  Status:  Discontinued     2 g 100 mL/hr over 30 Minutes Intravenous 3 times per day 10/18/14 1945 10/18/14 2105   10/18/14 2200  ceFEPIme (MAXIPIME) 1 g in dextrose 5 % 50 mL IVPB  Status:  Discontinued     1 g 100 mL/hr over 30 Minutes Intravenous 3 times per day 10/18/14 2037 10/19/14 1056   10/18/14 2000  vancomycin (VANCOCIN) 1,750 mg in sodium chloride 0.9 % 500 mL IVPB     1,750 mg 250 mL/hr over 120 Minutes Intravenous  Once 10/18/14 1945 10/19/14 0200   10/18/14 1915  aztreonam (AZACTAM) 2 g in dextrose 5 % 50 mL IVPB     2 g 100 mL/hr over 30 Minutes Intravenous  Once 10/18/14 1853 10/18/14 2024   10/18/14 1900  vancomycin (VANCOCIN) IVPB 1000 mg/200 mL premix  Status:  Discontinued     1,000 mg 200 mL/hr over 60 Minutes  Intravenous  Once 10/18/14 1853 10/18/14 1945         Objective: Filed Weights   10/18/14 1931 10/18/14 2359  Weight: 98.9 kg (218 lb 0.6 oz) 100.426 kg (221 lb 6.4 oz)    Intake/Output Summary (Last 24 hours) at 10/19/14 1142 Last data filed at 10/19/14 0912  Gross per 24 hour  Intake   1030 ml  Output    400 ml  Net    630 ml     Vitals Filed Vitals:   10/18/14 2359 10/19/14 0109 10/19/14 0524 10/19/14 1131  BP: 160/92  163/79 154/91  Pulse: 101  98 101  Temp: 100.1 F (37.8 C)  99.8 F (37.7 C)   TempSrc: Oral  Oral   Resp: 22     Height:      Weight: 100.426 kg (221 lb 6.4 oz)     SpO2: 95% 95% 95%      Exam: General: No acute respiratory distress Lungs: b/l basilar crackles- on 4 L O2- pulse ox 95% Cardiovascular: Regular rate and rhythm without murmur gallop or rub normal S1 and S2 Abdomen: Nontender, nondistended, soft, bowel sounds positive, no rebound, no ascites, no appreciable mass Extremities: No significant cyanosis, clubbing- + ankle edema bilaterally  Data Reviewed: Basic Metabolic Panel:  Recent Labs Lab 10/18/14 1743 10/19/14 0237  NA 138 139  K 4.7 4.4  CL 99 98  CO2 26 29  GLUCOSE 147* 231*  BUN 15 19  CREATININE 1.31 1.58*  CALCIUM 9.2 9.2   Liver Function Tests: No results found for this basename: AST, ALT, ALKPHOS, BILITOT, PROT, ALBUMIN,  in the last 168 hours No results found for this basename: LIPASE, AMYLASE,  in the last 168 hours No results found for this basename: AMMONIA,  in the last 168 hours CBC:  Recent Labs Lab 10/18/14 1743  WBC 8.9  HGB 13.3  HCT 40.3  MCV 88.6  PLT 184   Cardiac Enzymes:  Recent Labs Lab 10/18/14 2130 10/19/14 0237 10/19/14 0824  TROPONINI <0.30 <0.30 <0.30   BNP (last 3 results)  Recent Labs  07/16/14 1305 08/25/14 1430 10/18/14 1743  PROBNP 6079.0* 2499.0* 12301.0*   CBG:  Recent Labs Lab 10/18/14 1854 10/18/14 2119 10/18/14 2248 10/19/14 0557 10/19/14 1128  GLUCAP 155* 221* 245* 196* 178*    Recent Results (from the past 240 hour(s))  CLOSTRIDIUM DIFFICILE BY PCR     Status: None   Collection Time    10/19/14  2:29 AM      Result Value Ref Range Status   C difficile by pcr NEGATIVE  NEGATIVE Final     Studies:  Recent x-ray studies have been reviewed in detail by the Attending Physician  Scheduled Meds:  Scheduled Meds: . antiseptic oral rinse  7 mL Mouth Rinse BID  . apixaban  5 mg Oral BID  . atorvastatin  40 mg Oral Daily  . ceFEPime (MAXIPIME) IV  2 g Intravenous Q24H  . cilostazol  50 mg Oral BID  . divalproex  500 mg Oral BID  . finasteride  5 mg Oral Daily  .  furosemide  40 mg Intravenous BID  . insulin aspart  0-15 Units Subcutaneous TID WC  . insulin aspart  0-5 Units Subcutaneous QHS  . insulin glargine  40 Units Subcutaneous Daily  . metoprolol  50 mg Oral BID  . nitroGLYCERIN  1 inch Topical 4 times per day  . pantoprazole  20 mg Oral Daily  . sodium chloride  3 mL Intravenous Q12H  . sodium chloride  3 mL Intravenous Q12H  . spironolactone  25 mg Oral Daily  . tamsulosin  0.4 mg Oral Daily  . travoprost (benzalkonium)  1 drop Both Eyes QHS  . vancomycin  1,500 mg Intravenous Q24H   Continuous Infusions:   Time spent on care of this patient: 35 min   Las Nutrias, MD 10/19/2014, 11:42 AM  LOS: 1 day   [facility] Office  [phone] Pager - Text Page per [email or web address]  If 7PM-7AM, please contact night-coverage [email or web address]

## 2014-10-19 NOTE — Progress Notes (Signed)
Pt requested that RN removed flexiseal. Flexiseal removed.

## 2014-10-19 NOTE — Progress Notes (Signed)
Utilization review completed. Sarea Fyfe, RN, BSN. 

## 2014-10-19 NOTE — ED Provider Notes (Signed)
I saw and evaluated the patient, reviewed the resident's note and I agree with the findings and plan.  Please see my separate note regarding my evaluation of the patient.    Johnna Acosta, MD 10/19/14 [phone]

## 2014-10-20 ENCOUNTER — Inpatient Hospital Stay (HOSPITAL_COMMUNITY): Payer: PRIVATE HEALTH INSURANCE

## 2014-10-20 DIAGNOSIS — I5033 Acute on chronic diastolic (congestive) heart failure: Secondary | ICD-10-CM

## 2014-10-20 LAB — GLUCOSE, CAPILLARY
GLUCOSE-CAPILLARY: 100 mg/dL — AB (ref 70–99)
GLUCOSE-CAPILLARY: 153 mg/dL — AB (ref 70–99)
Glucose-Capillary: 119 mg/dL — ABNORMAL HIGH (ref 70–99)
Glucose-Capillary: 204 mg/dL — ABNORMAL HIGH (ref 70–99)

## 2014-10-20 LAB — BASIC METABOLIC PANEL
Anion gap: 8 (ref 5–15)
BUN: 22 mg/dL (ref 6–23)
CHLORIDE: 102 meq/L (ref 96–112)
CO2: 31 mEq/L (ref 19–32)
Calcium: 9.1 mg/dL (ref 8.4–10.5)
Creatinine, Ser: 1.65 mg/dL — ABNORMAL HIGH (ref 0.50–1.35)
GFR calc Af Amer: 45 mL/min — ABNORMAL LOW (ref >90)
GFR calc non Af Amer: 39 mL/min — ABNORMAL LOW (ref >90)
Glucose, Bld: 98 mg/dL (ref 70–99)
POTASSIUM: 3.9 meq/L (ref 3.7–5.3)
Sodium: 141 mEq/L (ref 137–147)

## 2014-10-20 MED ORDER — SIMETHICONE 40 MG/0.6ML PO SUSP
40.0000 mg | Freq: Four times a day (QID) | ORAL | Status: DC | PRN
  Administered 2014-10-20 – 2014-10-21 (×2): 40 mg via ORAL
  Filled 2014-10-20 (×3): qty 0.6

## 2014-10-20 MED ORDER — HYDRALAZINE HCL 25 MG PO TABS
25.0000 mg | ORAL_TABLET | Freq: Three times a day (TID) | ORAL | Status: DC
  Administered 2014-10-20 – 2014-10-22 (×6): 25 mg via ORAL
  Filled 2014-10-20 (×11): qty 1

## 2014-10-20 MED ORDER — SPIRONOLACTONE 100 MG PO TABS
100.0000 mg | ORAL_TABLET | Freq: Two times a day (BID) | ORAL | Status: DC
  Administered 2014-10-20 – 2014-10-21 (×2): 100 mg via ORAL
  Filled 2014-10-20 (×4): qty 1

## 2014-10-20 MED ORDER — FUROSEMIDE 10 MG/ML IJ SOLN
80.0000 mg | Freq: Two times a day (BID) | INTRAMUSCULAR | Status: DC
Start: 2014-10-20 — End: 2014-10-22
  Administered 2014-10-20 – 2014-10-22 (×4): 80 mg via INTRAVENOUS
  Filled 2014-10-20 (×5): qty 8

## 2014-10-20 MED ORDER — FUROSEMIDE 10 MG/ML IJ SOLN
40.0000 mg | Freq: Once | INTRAMUSCULAR | Status: AC
  Administered 2014-10-20: 40 mg via INTRAVENOUS
  Filled 2014-10-20: qty 4

## 2014-10-20 MED ORDER — ISOSORBIDE MONONITRATE ER 30 MG PO TB24
30.0000 mg | ORAL_TABLET | Freq: Every day | ORAL | Status: DC
  Administered 2014-10-20 – 2014-10-22 (×3): 30 mg via ORAL
  Filled 2014-10-20 (×3): qty 1

## 2014-10-20 NOTE — Significant Event (Signed)
SATURATION QUALIFICATIONS: (This note is used to comply with regulatory documentation for home oxygen)  Patient Saturations on Room Air at Rest = 82%  Patient Saturations on Room Air while Ambulating =na%  Patient Saturations on 2 Liters of oxygen while Ambulating =unable to amb due to SOB 95%  Please briefly explain why patient needs home oxygen:patient desats at rest to 82% on RA.

## 2014-10-20 NOTE — Progress Notes (Signed)
[facility] Progress Note   NOHA KARASIK URK:[id number] DOB: Jul 16, 1936 DOA: 10/18/2014 PCP: Jilda Panda, MD  Brief narrative: [name] is a 78 y.o. male with chronic systolic heart failure, atrial fibrillation, hypertension and coronary artery disease status post stenting in 2011 and a recent CVA suspected to be secondary to atrial fibrillation -he was discharged on alkalosis on 9/1.  The patient presents with shortness of breath which started about 2 days ago. He also has problems with swelling of his ankles.   Subjective: Not short or breath at rest but significant dyspnea on exertion with hypoxia.   Assessment/Plan: Principal Problem:   Acute respiratory failure with hypoxemia-acute systolic heart failure- EF 40-45% -Most likely secondary to acute on chronic systolic heart failure rather than pneumonia as patient is asymptomatic for pneumonia - has not diuresed well and continues to have hypoxia, DOE and pulm edema -Will increase Lasix to 80 mg IV twice a day andincrease Aldactone  to 100 BID and follow I and O and daily weights - recent echo in August 2015 and therefore we will not repeat 1 at this time -He is not on an ACE inhibitor, most likely due to CKD 3  -Change nitro paste to Imdur- add hydralazine to further decrease afterload  Active Problems:   DM (diabetes mellitus), type 2 with renal complications -Sugars much better controlled with increase in Lantus-  continue sliding scale    Essential hypertension -adding Imdur and Hydralazine    Atrial fibrillation -Continue Eliquis and Metoprolol    CKD (chronic kidney disease), stage III - follow renal function closely with diuresis- so far is stable  BPH -cont flomax and Proscar  CAD/ PVD - not on ASA- will add   Code Status: Full code Family Communication: none Disposition Plan: home DVT prophylaxis: Eliquis  Consultants: none  Procedures: none  Antibiotics: Anti-infectives   Start      Dose/Rate Route Frequency Ordered Stop   10/19/14 2200  vancomycin (VANCOCIN) 1,500 mg in sodium chloride 0.9 % 500 mL IVPB  Status:  Discontinued     1,500 mg 250 mL/hr over 120 Minutes Intravenous Every 24 hours 10/19/14 1055 10/19/14 1147   10/19/14 2200  ceFEPIme (MAXIPIME) 2 g in dextrose 5 % 50 mL IVPB  Status:  Discontinued     2 g 100 mL/hr over 30 Minutes Intravenous Every 24 hours 10/19/14 1056 10/19/14 1147   10/19/14 0800  vancomycin (VANCOCIN) IVPB 1000 mg/200 mL premix  Status:  Discontinued     1,000 mg 200 mL/hr over 60 Minutes Intravenous Every 12 hours 10/18/14 1945 10/19/14 1055   10/19/14 0400  aztreonam (AZACTAM) 2 g in dextrose 5 % 50 mL IVPB  Status:  Discontinued     2 g 100 mL/hr over 30 Minutes Intravenous 3 times per day 10/18/14 1945 10/18/14 2105   10/18/14 2200  ceFEPIme (MAXIPIME) 1 g in dextrose 5 % 50 mL IVPB  Status:  Discontinued     1 g 100 mL/hr over 30 Minutes Intravenous 3 times per day 10/18/14 2037 10/19/14 1056   10/18/14 2000  vancomycin (VANCOCIN) 1,750 mg in sodium chloride 0.9 % 500 mL IVPB     1,750 mg 250 mL/hr over 120 Minutes Intravenous  Once 10/18/14 1945 10/19/14 0200   10/18/14 1915  aztreonam (AZACTAM) 2 g in dextrose 5 % 50 mL IVPB     2 g 100 mL/hr over 30 Minutes Intravenous  Once 10/18/14 1853 10/18/14 2024   10/18/14 1900  vancomycin (VANCOCIN) IVPB 1000 mg/200 mL premix  Status:  Discontinued     1,000 mg 200 mL/hr over 60 Minutes Intravenous  Once 10/18/14 1853 10/18/14 1945         Objective: Filed Weights   10/18/14 1931 10/18/14 2359 10/20/14 0401  Weight: 98.9 kg (218 lb 0.6 oz) 100.426 kg (221 lb 6.4 oz) 98.521 kg (217 lb 3.2 oz)    Intake/Output Summary (Last 24 hours) at 10/20/14 1351 Last data filed at 10/20/14 0925  Gross per 24 hour  Intake    720 ml  Output   2300 ml  Net  -1580 ml     Vitals Filed Vitals:   10/19/14 1131 10/19/14 1358 10/19/14 1947 10/20/14 0401  BP: 154/91 119/57 147/86  122/58  Pulse: 101 103 93 104  Temp:  98.7 F (37.1 C) 98.9 F (37.2 C) 98.3 F (36.8 C)  TempSrc:  Oral Oral Oral  Resp:  20 18 18   Height:      Weight:    98.521 kg (217 lb 3.2 oz)  SpO2:  93% 99% 99%    Exam: General: No acute respiratory distress Lungs: b/l basilar crackles- on 2 L O2- pulse ox 95% Cardiovascular: Regular rate and rhythm without murmur gallop or rub normal S1 and S2 Abdomen: Nontender, nondistended, soft, bowel sounds positive, no rebound, no ascites, no appreciable mass Extremities: No significant cyanosis, clubbing- + ankle edema bilaterally  Data Reviewed: Basic Metabolic Panel:  Recent Labs Lab 10/18/14 1743 10/19/14 0237 10/20/14 0327  NA 138 139 141  K 4.7 4.4 3.9  CL 99 98 102  CO2 26 29 31   GLUCOSE 147* 231* 98  BUN 15 19 22   CREATININE 1.31 1.58* 1.65*  CALCIUM 9.2 9.2 9.1   Liver Function Tests: No results found for this basename: AST, ALT, ALKPHOS, BILITOT, PROT, ALBUMIN,  in the last 168 hours No results found for this basename: LIPASE, AMYLASE,  in the last 168 hours No results found for this basename: AMMONIA,  in the last 168 hours CBC:  Recent Labs Lab 10/18/14 1743 10/19/14 0355  WBC 8.9 8.6  HGB 13.3 12.1*  HCT 40.3 37.5*  MCV 88.6 89.1  PLT 184 181   Cardiac Enzymes:  Recent Labs Lab 10/18/14 2130 10/19/14 0237 10/19/14 0824  TROPONINI <0.30 <0.30 <0.30   BNP (last 3 results)  Recent Labs  07/16/14 1305 08/25/14 1430 10/18/14 1743  PROBNP 6079.0* 2499.0* 12301.0*   CBG:  Recent Labs Lab 10/19/14 1128 10/19/14 1612 10/19/14 2111 10/20/14 0551 10/20/14 1128  GLUCAP 178* 192* 156* 100* 119*    Recent Results (from the past 240 hour(s))  CLOSTRIDIUM DIFFICILE BY PCR     Status: None   Collection Time    10/19/14  2:29 AM      Result Value Ref Range Status   C difficile by pcr NEGATIVE  NEGATIVE Final     Studies:  Recent x-ray studies have been reviewed in detail by the Attending  Physician  Scheduled Meds:  Scheduled Meds: . antiseptic oral rinse  7 mL Mouth Rinse BID  . apixaban  5 mg Oral BID  . atorvastatin  40 mg Oral Daily  . cilostazol  50 mg Oral BID  . divalproex  500 mg Oral BID  . finasteride  5 mg Oral Daily  . furosemide  40 mg Intravenous Once  . furosemide  80 mg Intravenous BID  . insulin aspart  0-15 Units Subcutaneous TID WC  . insulin  aspart  0-5 Units Subcutaneous QHS  . insulin glargine  44 Units Subcutaneous Daily  . metoprolol  50 mg Oral BID  . nitroGLYCERIN  1 inch Topical 4 times per day  . pantoprazole  20 mg Oral Daily  . sodium chloride  3 mL Intravenous Q12H  . sodium chloride  3 mL Intravenous Q12H  . spironolactone  100 mg Oral BID  . tamsulosin  0.4 mg Oral Daily  . travoprost (benzalkonium)  1 drop Both Eyes QHS   Continuous Infusions:   Time spent on care of this patient: 35 min   [employer], MD 10/20/2014, 1:51 PM  LOS: 2 days   [facility] Office  [phone] Pager - Text Page per [email or web address]  If 7PM-7AM, please contact night-coverage [email or web address]

## 2014-10-21 LAB — BASIC METABOLIC PANEL
ANION GAP: 11 (ref 5–15)
BUN: 25 mg/dL — ABNORMAL HIGH (ref 6–23)
CHLORIDE: 99 meq/L (ref 96–112)
CO2: 31 meq/L (ref 19–32)
Calcium: 8.9 mg/dL (ref 8.4–10.5)
Creatinine, Ser: 1.67 mg/dL — ABNORMAL HIGH (ref 0.50–1.35)
GFR calc non Af Amer: 38 mL/min — ABNORMAL LOW (ref >90)
GFR, EST AFRICAN AMERICAN: 44 mL/min — AB (ref >90)
Glucose, Bld: 199 mg/dL — ABNORMAL HIGH (ref 70–99)
POTASSIUM: 3.6 meq/L — AB (ref 3.7–5.3)
Sodium: 141 mEq/L (ref 137–147)

## 2014-10-21 LAB — GLUCOSE, CAPILLARY
GLUCOSE-CAPILLARY: 209 mg/dL — AB (ref 70–99)
Glucose-Capillary: 95 mg/dL (ref 70–99)
Glucose-Capillary: 153 mg/dL — ABNORMAL HIGH (ref 70–99)
Glucose-Capillary: 224 mg/dL — ABNORMAL HIGH (ref 70–99)

## 2014-10-21 MED ORDER — SPIRONOLACTONE 50 MG PO TABS
50.0000 mg | ORAL_TABLET | Freq: Two times a day (BID) | ORAL | Status: DC
  Administered 2014-10-21 – 2014-10-22 (×2): 50 mg via ORAL
  Filled 2014-10-21 (×4): qty 1

## 2014-10-21 NOTE — Significant Event (Signed)
SATURATION QUALIFICATIONS: (This note is used to comply with regulatory documentation for home oxygen)  Patient Saturations on Room Air at Rest = 95%  Patient Saturations on Room Air while Ambulating = **93%  Patient Saturations on 2Liters of oxygen while Ambulating = 98%  Please briefly explain why patient needs home oxygen: patient desats to 88% on RA when sleeping.

## 2014-10-21 NOTE — Progress Notes (Addendum)
[facility] Progress Note   [name] JIR:[id number] DOB: December 20, 1936 DOA: 10/18/2014 PCP: [name] Panda, MD  Brief narrative: [name] is a 78 y.o. male with chronic systolic heart failure, atrial fibrillation, hypertension and coronary artery disease status post stenting in 2011 and a recent CVA suspected to be secondary to atrial fibrillation -he was discharged on Eliquis on 9/1.  The patient presents with shortness of breath which started about 2 days ago. He also has problems with swelling of his ankles.   Subjective: Exertional dyspnea improved today - pulse ox 93% with exertion- per RN notes O2 drops < 88 while asleepy  Assessment/Plan: Principal Problem:   Acute respiratory failure with hypoxemia-acute systolic heart failure- EF 40-45% -Most likely secondary to acute on chronic systolic heart failure rather than pneumonia as patient is asymptomatic for pneumonia -Diuresis much improved with increase in Lasix to 80 mg IV twice a day and increase in Aldactone  to 100 BID  - recent echo in August 2015 and therefore we will not repeat 1 at this time -He is not on an ACE inhibitor, most likely due to CKD 3  -Changed nitro paste to Imdur- added hydralazine to further decrease afterload  Active Problems: OSA? - follow pusle ox at night    DM (diabetes mellitus), type 2 with renal complications -Sugars much better controlled with increase in Lantus-  continue sliding scale    Essential hypertension -adding Imdur and Hydralazine- BM stable in 120/60's    Atrial fibrillation -Continue Eliquis and Metoprolol   AKI on CKD (chronic kidney disease), stage III - following  renal function closely with diuresis-  BPH -cont flomax and Proscar  CAD/ PVD - not on ASA- will add   Code Status: Full code Family Communication: none Disposition Plan: home DVT prophylaxis: Eliquis  Consultants: none  Procedures: none  Antibiotics: Anti-infectives   Start      Dose/Rate Route Frequency Ordered Stop   10/19/14 2200  vancomycin (VANCOCIN) 1,500 mg in sodium chloride 0.9 % 500 mL IVPB  Status:  Discontinued     1,500 mg 250 mL/hr over 120 Minutes Intravenous Every 24 hours 10/19/14 1055 10/19/14 1147   10/19/14 2200  ceFEPIme (MAXIPIME) 2 g in dextrose 5 % 50 mL IVPB  Status:  Discontinued     2 g 100 mL/hr over 30 Minutes Intravenous Every 24 hours 10/19/14 1056 10/19/14 1147   10/19/14 0800  vancomycin (VANCOCIN) IVPB 1000 mg/200 mL premix  Status:  Discontinued     1,000 mg 200 mL/hr over 60 Minutes Intravenous Every 12 hours 10/18/14 1945 10/19/14 1055   10/19/14 0400  aztreonam (AZACTAM) 2 g in dextrose 5 % 50 mL IVPB  Status:  Discontinued     2 g 100 mL/hr over 30 Minutes Intravenous 3 times per day 10/18/14 1945 10/18/14 2105   10/18/14 2200  ceFEPIme (MAXIPIME) 1 g in dextrose 5 % 50 mL IVPB  Status:  Discontinued     1 g 100 mL/hr over 30 Minutes Intravenous 3 times per day 10/18/14 2037 10/19/14 1056   10/18/14 2000  vancomycin (VANCOCIN) 1,750 mg in sodium chloride 0.9 % 500 mL IVPB     1,750 mg 250 mL/hr over 120 Minutes Intravenous  Once 10/18/14 1945 10/19/14 0200   10/18/14 1915  aztreonam (AZACTAM) 2 g in dextrose 5 % 50 mL IVPB     2 g 100 mL/hr over 30 Minutes Intravenous  Once 10/18/14 1853 10/18/14 2024   10/18/14 1900  vancomycin (VANCOCIN) IVPB 1000 mg/200 mL premix  Status:  Discontinued     1,000 mg 200 mL/hr over 60 Minutes Intravenous  Once 10/18/14 1853 10/18/14 1945         Objective: Filed Weights   10/18/14 2359 10/20/14 0401 10/21/14 0506  Weight: 100.426 kg (221 lb 6.4 oz) 98.521 kg (217 lb 3.2 oz) 97.614 kg (215 lb 3.2 oz)    Intake/Output Summary (Last 24 hours) at 10/21/14 1458 Last data filed at 10/21/14 1323  Gross per 24 hour  Intake    720 ml  Output   1501 ml  Net   -781 ml     Vitals Filed Vitals:   10/20/14 2014 10/21/14 0506 10/21/14 0829 10/21/14 1405  BP: 102/58 123/62  125/69   Pulse: 92 94  106  Temp: 98 F (36.7 C) 97.6 F (36.4 C)  98.4 F (36.9 C)  TempSrc: Oral Oral  Oral  Resp: 18 18  20   Height:      Weight:  97.614 kg (215 lb 3.2 oz)    SpO2: 98% 94% 94% 91%    Exam: General: No acute respiratory distress Lungs: mild crackles in Right base today- pulse ox 95% on room air today Cardiovascular: Regular rate and rhythm without murmur gallop or rub normal S1 and S2 Abdomen: Nontender, nondistended, soft, bowel sounds positive, no rebound, no ascites, no appreciable mass Extremities: No significant cyanosis, clubbing- + ankle edema bilaterally  Data Reviewed: Basic Metabolic Panel:  Recent Labs Lab 10/18/14 1743 10/19/14 0237 10/20/14 0327 10/21/14 0332  NA 138 139 141 141  K 4.7 4.4 3.9 3.6*  CL 99 98 102 99  CO2 26 29 31 31   GLUCOSE 147* 231* 98 199*  BUN 15 19 22  25*  CREATININE 1.31 1.58* 1.65* 1.67*  CALCIUM 9.2 9.2 9.1 8.9   Liver Function Tests: No results found for this basename: AST, ALT, ALKPHOS, BILITOT, PROT, ALBUMIN,  in the last 168 hours No results found for this basename: LIPASE, AMYLASE,  in the last 168 hours No results found for this basename: AMMONIA,  in the last 168 hours CBC:  Recent Labs Lab 10/18/14 1743 10/19/14 0355  WBC 8.9 8.6  HGB 13.3 12.1*  HCT 40.3 37.5*  MCV 88.6 89.1  PLT 184 181   Cardiac Enzymes:  Recent Labs Lab 10/18/14 2130 10/19/14 0237 10/19/14 0824  TROPONINI <0.30 <0.30 <0.30   BNP (last 3 results)  Recent Labs  07/16/14 1305 08/25/14 1430 10/18/14 1743  PROBNP 6079.0* 2499.0* 12301.0*   CBG:  Recent Labs Lab 10/20/14 1128 10/20/14 1609 10/20/14 2106 10/21/14 0520 10/21/14 1124  GLUCAP 119* 204* 153* 209* 95    Recent Results (from the past 240 hour(s))  CULTURE, BLOOD (ROUTINE X 2)     Status: None   Collection Time    10/18/14  7:32 PM      Result Value Ref Range Status   Specimen Description BLOOD RIGHT ANTECUBITAL   Final   Special Requests BOTTLES  DRAWN AEROBIC AND ANAEROBIC 5CC   Final   Culture  Setup Time     Final   Value: 10/19/2014 00:51     Performed at [facility]   Culture     Final   Value:        BLOOD CULTURE RECEIVED NO GROWTH TO DATE CULTURE WILL BE HELD FOR 5 DAYS BEFORE ISSUING A FINAL NEGATIVE REPORT     Performed at [facility]   Report  Status PENDING   Incomplete  CULTURE, BLOOD (ROUTINE X 2)     Status: None   Collection Time    10/18/14  7:37 PM      Result Value Ref Range Status   Specimen Description BLOOD RIGHT HAND   Final   Special Requests BOTTLES DRAWN AEROBIC ONLY 3CC   Final   Culture  Setup Time     Final   Value: 10/19/2014 00:51     Performed at [facility]   Culture     Final   Value:        BLOOD CULTURE RECEIVED NO GROWTH TO DATE CULTURE WILL BE HELD FOR 5 DAYS BEFORE ISSUING A FINAL NEGATIVE REPORT     Performed at [facility]   Report Status PENDING   Incomplete  CLOSTRIDIUM DIFFICILE BY PCR     Status: None   Collection Time    10/19/14  2:29 AM      Result Value Ref Range Status   C difficile by pcr NEGATIVE  NEGATIVE Final     Studies:  Recent x-ray studies have been reviewed in detail by the Attending Physician  Scheduled Meds:  Scheduled Meds: . antiseptic oral rinse  7 mL Mouth Rinse BID  . apixaban  5 mg Oral BID  . atorvastatin  40 mg Oral Daily  . cilostazol  50 mg Oral BID  . divalproex  500 mg Oral BID  . finasteride  5 mg Oral Daily  . furosemide  80 mg Intravenous BID  . hydrALAZINE  25 mg Oral 3 times per day  . insulin aspart  0-15 Units Subcutaneous TID WC  . insulin aspart  0-5 Units Subcutaneous QHS  . insulin glargine  44 Units Subcutaneous Daily  . isosorbide mononitrate  30 mg Oral Daily  . metoprolol  50 mg Oral BID  . pantoprazole  20 mg Oral Daily  . sodium chloride  3 mL Intravenous Q12H  . sodium chloride  3 mL Intravenous Q12H  . spironolactone  100 mg Oral BID  . tamsulosin  0.4 mg Oral Daily  . travoprost  (benzalkonium)  1 drop Both Eyes QHS   Continuous Infusions:   Time spent on care of this patient: 35 min   [place], MD 10/21/2014, 2:58 PM  LOS: 3 days   [facility] Office  [phone] Pager - Text Page per [email or web address]  If 7PM-7AM, please contact night-coverage [email or web address]

## 2014-10-22 LAB — BASIC METABOLIC PANEL
Anion gap: 14 (ref 5–15)
BUN: 26 mg/dL — AB (ref 6–23)
CALCIUM: 9.2 mg/dL (ref 8.4–10.5)
CO2: 29 meq/L (ref 19–32)
Chloride: 98 mEq/L (ref 96–112)
Creatinine, Ser: 1.67 mg/dL — ABNORMAL HIGH (ref 0.50–1.35)
GFR calc Af Amer: 44 mL/min — ABNORMAL LOW (ref >90)
GFR, EST NON AFRICAN AMERICAN: 38 mL/min — AB (ref >90)
Glucose, Bld: 80 mg/dL (ref 70–99)
Potassium: 3.7 mEq/L (ref 3.7–5.3)
Sodium: 141 mEq/L (ref 137–147)

## 2014-10-22 LAB — GLUCOSE, CAPILLARY
Glucose-Capillary: 87 mg/dL (ref 70–99)
Glucose-Capillary: 294 mg/dL — ABNORMAL HIGH (ref 70–99)

## 2014-10-22 MED ORDER — HYDRALAZINE HCL 25 MG PO TABS: 25.0000 mg | ORAL_TABLET | Freq: Three times a day (TID) | ORAL | Status: DC

## 2014-10-22 MED ORDER — ISOSORBIDE MONONITRATE ER 30 MG PO TB24: 30.0000 mg | ORAL_TABLET | Freq: Every day | ORAL | Status: DC

## 2014-10-22 MED ORDER — SPIRONOLACTONE 25 MG PO TABS: 25.0000 mg | ORAL_TABLET | Freq: Every day | ORAL | Status: DC

## 2014-10-22 MED ORDER — FUROSEMIDE 40 MG PO TABS: 80.0000 mg | ORAL_TABLET | Freq: Two times a day (BID) | ORAL | Status: DC

## 2014-10-22 NOTE — Care Management Note (Signed)
    Page 1 of 2   10/22/2014     2:27:12 PM CARE MANAGEMENT NOTE 10/22/2014  Patient:  [name], [name]   Account Number:  [id number]  Date Initiated:  10/22/2014  Documentation initiated by:  [facility]  Subjective/Objective Assessment:   78 y.o. male, with PMHx of CHF, HTN and CAD status post MI status post stent placement presenting with 2 days history of SOB.//Home alone- has private caregivers.     Action/Plan:   Lasix to IV; Fluid restrictions;  Serial troponins.//Access for disposition needs.   Anticipated DC Date:  10/22/2014   Anticipated DC Plan:  [place]  CM consult      [facility] Choice  HOME HEALTH  Resumption Of Svcs/PTA Provider   Choice offered to / List presented to:  C-1 Patient        [place] arranged  HH-1 RN  [place]      [place].   Status of service:  Completed, signed off Medicare Important Message given?  YES (If response is "NO", the following Medicare IM given date fields will be blank) Date Medicare IM given:  10/22/2014 Medicare IM given by:  [facility] Date Additional Medicare IM given:   Additional Medicare IM given by:    Discharge Disposition:  [place]  Per UR Regulation:  Reviewed for med. necessity/level of care/duration of stay  If discussed at [place] of Stay Meetings, dates discussed:   10/23/2014    Comments:  10/22/14 Dejaun Vidrio [name], RN, BSN, [employer] [phone] Spoke with pt at bedside regarding discharge planning for [facility]. Offered pt list of home health agencies to choose from.  Pt chose [facility] to render services. Clover Mealy, RN of [facility] notified.  No DME needs identified at this time.

## 2014-10-22 NOTE — Progress Notes (Signed)
Patient's oxygen saturation between 93-96% while ambulating on room air.  Patient has no complaints of shortness of breath.  Will continue to monitor.

## 2014-10-22 NOTE — Discharge Summary (Signed)
Physician Discharge Summary  [name] NIO:[id number] DOB: 08/16/1936 DOA: 10/18/2014  PCP: Jilda Panda, MD  Admit date: 10/18/2014 Discharge date: 10/22/2014  Time spent: >45 minutes  Recommendations for Outpatient Follow-up:  1. Follow for fluid overload/dehydration on new doses of Furosemide and Spironolactone 2. Follow BP on newly started Hydralazine and Imdur 3. Dry weight is 96/3 kg/ 212 lb  Discharge Condition: stable Diet recommendation: low sodium heart healthy  Discharge Diagnoses:  Principal Problem:   Acute respiratory failure with hypoxemia Active Problems:   DM (diabetes mellitus), type 2 with renal complications   Essential hypertension   Atrial fibrillation   Acute on chronic diastolic congestive heart failure   CKD (chronic kidney disease), stage III   History of present illness:  [name] is a 78 y.o. male with chronic systolic heart failure, atrial fibrillation, hypertension and coronary artery disease status post stenting in 2011 and a recent CVA suspected to be secondary to atrial fibrillation -he was discharged on  Eliquis on 9/1.  The patient presents with shortness of breath which started about 2 prior to admission. He also has problems with swelling of his ankles and this was worse than usual.   Hospital Course:  Principal Problem:  Acute respiratory failure with hypoxemia-acute systolic heart failure- EF 40-45%  -Most likely secondary to acute on chronic systolic heart failure rather than pneumonia as patient is asymptomatic for pneumonia  -fluid overload much improved with increase in Lasix to 80 mg IV twice a day and increase in Aldactone to 100 BID  - recent echo in August 2015 and therefore we will not repeat 1 at this time  -He is not on an ACE inhibitor, most likely due to CKD 3  -Changed nitro paste to Imdur- added hydralazine to further decrease afterload  - will be discharged with a slightly higher dose of Furosemide and  Aldactone- will need to monitor closely as outpt to ensure these doses are appropriate for the patient.  - new dry weight 96.3 kg -   Active Problems:  OSA?  - follow pusle ox at night   DM (diabetes mellitus), type 2 with renal complications  -Sugars much better controlled with increase in Lantus- continue sliding scale   Essential hypertension  -adding Imdur and Hydralazine to Metoprolol -  BM stable in 120/60's   Atrial fibrillation  -Continue Eliquis and Metoprolol   AKI on CKD (chronic kidney disease), stage III  - following renal function closely with diuresis- Cr slightly higher than on admission at 1.67 - we may need to accept this as his new baseline   BPH  -cont flomax and Proscar   CAD/ PVD  - not on ASA- will add      Procedures:  none  Consultations:  none  Discharge Exam: Filed Weights   10/20/14 0401 10/21/14 0506 10/22/14 0532  Weight: 98.521 kg (217 lb 3.2 oz) 97.614 kg (215 lb 3.2 oz) 96.3 kg (212 lb 4.9 oz)   Filed Vitals:   10/22/14 0532  BP: 125/69  Pulse: 101  Temp: 98.1 F (36.7 C)  Resp: 17    General: AAO x 3, no distress Cardiovascular: RRR, no murmurs  Respiratory: clear to auscultation bilaterally GI: soft, non-tender, non-distended, bowel sound positive  Discharge Instructions You were cared for by a hospitalist during your hospital stay. If you have any questions about your discharge medications or the care you received while you were in the hospital after you are discharged, you can call the  unit and asked to speak with the hospitalist on call if the hospitalist that took care of you is not available. Once you are discharged, your primary care physician will handle any further medical issues. Please note that NO REFILLS for any discharge medications will be authorized once you are discharged, as it is imperative that you return to your primary care physician (or establish a relationship with a primary care physician if you do not  have one) for your aftercare needs so that they can reassess your need for medications and monitor your lab values.  Discharge Instructions   Diet - low sodium heart healthy    Complete by:  As directed      Increase activity slowly    Complete by:  As directed             Medication List         albuterol 108 (90 BASE) MCG/ACT inhaler  Commonly known as:  PROVENTIL HFA;VENTOLIN HFA  Inhale 2 puffs into the lungs every 6 (six) hours as needed for shortness of breath.     apixaban 5 MG Tabs tablet  Commonly known as:  ELIQUIS  Take 1 tablet (5 mg total) by mouth 2 (two) times daily.     atorvastatin 40 MG tablet  Commonly known as:  LIPITOR  Take 1 tablet (40 mg total) by mouth daily.     cilostazol 50 MG tablet  Commonly known as:  PLETAL  Take 50 mg by mouth 2 (two) times daily.     divalproex 250 MG DR tablet  Commonly known as:  DEPAKOTE  Take 500 mg by mouth 2 (two) times daily.     finasteride 5 MG tablet  Commonly known as:  PROSCAR  Take 5 mg by mouth daily.     furosemide 40 MG tablet  Commonly known as:  LASIX  Take 2 tablets (80 mg total) by mouth 2 (two) times daily.     gabapentin 300 MG capsule  Commonly known as:  NEURONTIN  Take 300-600 mg by mouth 2 (two) times daily. 600mg  in the morning and 300mg  in the evening     hydrALAZINE 25 MG tablet  Commonly known as:  APRESOLINE  Take 1 tablet (25 mg total) by mouth every 8 (eight) hours.     insulin glargine 100 UNIT/ML injection  Commonly known as:  LANTUS  Inject 40 Units into the skin daily.     insulin regular 100 units/mL injection  Commonly known as:  NOVOLIN R,HUMULIN R  Inject 8 Units into the skin 2 (two) times daily before a meal. Per sliding scale     isosorbide mononitrate 30 MG 24 hr tablet  Commonly known as:  IMDUR  Take 1 tablet (30 mg total) by mouth daily.     metoprolol 50 MG tablet  Commonly known as:  LOPRESSOR  Take 1 tablet (50 mg total) by mouth 2 (two) times daily.      pantoprazole 20 MG tablet  Commonly known as:  PROTONIX  Take 20 mg by mouth daily.     spironolactone 25 MG tablet  Commonly known as:  ALDACTONE  Take 1 tablet (25 mg total) by mouth daily.     tamsulosin 0.4 MG Caps capsule  Commonly known as:  FLOMAX  Take 0.4 mg by mouth daily.     travoprost (benzalkonium) 0.004 % ophthalmic solution  Commonly known as:  TRAVATAN  Place 1 drop into both eyes at bedtime.  VIAGRA 100 MG tablet  Generic drug:  sildenafil  Take 100 mg by mouth daily as needed for erectile dysfunction.       Allergies  Allergen Reactions  . Penicillins Other (See Comments)     hands and feet edema      The results of significant diagnostics from this hospitalization (including imaging, microbiology, ancillary and laboratory) are listed below for reference.    Significant Diagnostic Studies: Dg Chest 2 View  10/20/2014   CLINICAL DATA:  Pulmonary edema  EXAM: CHEST  2 VIEW  COMPARISON:  10/19/2014  FINDINGS: Cardiomegaly with pulmonary vascular congestion and possible mild interstitial edema, although improved.  No pleural effusion or pneumothorax.  IMPRESSION: Cardiomegaly with pulmonary vascular congestion and possible mild interstitial edema, although improved.   Electronically Signed   By: Julian Hy M.D.   On: 10/20/2014 16:22   X-ray Chest Pa And Lateral  10/19/2014   CLINICAL DATA:  Shortness of breath and weakness with history of congestive heart failure, diabetes and chronic renal insufficiency  EXAM: CHEST  2 VIEW  COMPARISON:  PA and lateral chest x-ray of October 18, 2014  FINDINGS: The lungs are adequately inflated. The pulmonary interstitial markings remain increased but are slightly less conspicuous. Subtle confluent density on the earlier study peripherally in the left mid lung is less conspicuous today. The cardiopericardial silhouette remains enlarged. The pulmonary vascularity is more distinct centrally. There is a small pleural  effusion layering posteriorly on the left. The mediastinum is normal in width. There is no pneumothorax. The bony thorax is unremarkable.  IMPRESSION: Congestive heart failure with bilateral interstitial edema and small left pleural effusion. There has been slight interval improvement in the appearance of the chest since yesterday's study.   Electronically Signed   By: David  Martinique   On: 10/19/2014 11:07   Dg Chest 2 View (if Patient Has Fever And/or Copd)  10/18/2014   CLINICAL DATA:  SHORTNESS OF BREATH LEG SWELLING SHORTNESS OF BREATH LEG SWELLING Comments: pt states that he has had increasing SOB since yesterday  EXAM: CHEST  2 VIEW  COMPARISON:  Radiograph 829 1,015  FINDINGS: Mildly enlarged cardiac silhouette. There is peripherally airspace disease in the left lower lobe which is new from prior. Bilateral small effusions. There is central venous pulmonary congestion. There is mild interstitial edema.  IMPRESSION: Opacity in the lateral left hemi thorax is increased compared to prior. This either represents pneumonia or loculated pleural fluid.   Electronically Signed   By: Suzy Bouchard M.D.   On: 10/18/2014 18:21    Microbiology: Recent Results (from the past 240 hour(s))  CULTURE, BLOOD (ROUTINE X 2)     Status: None   Collection Time    10/18/14  7:32 PM      Result Value Ref Range Status   Specimen Description BLOOD RIGHT ANTECUBITAL   Final   Special Requests BOTTLES DRAWN AEROBIC AND ANAEROBIC 5CC   Final   Culture  Setup Time     Final   Value: 10/19/2014 00:51     Performed at [facility]   Culture     Final   Value:        BLOOD CULTURE RECEIVED NO GROWTH TO DATE CULTURE WILL BE HELD FOR 5 DAYS BEFORE ISSUING A FINAL NEGATIVE REPORT     Performed at [facility]   Report Status PENDING   Incomplete  CULTURE, BLOOD (ROUTINE X 2)     Status: None  Collection Time    10/18/14  7:37 PM      Result Value Ref Range Status   Specimen Description BLOOD RIGHT  HAND   Final   Special Requests BOTTLES DRAWN AEROBIC ONLY 3CC   Final   Culture  Setup Time     Final   Value: 10/19/2014 00:51     Performed at [facility]   Culture     Final   Value:        BLOOD CULTURE RECEIVED NO GROWTH TO DATE CULTURE WILL BE HELD FOR 5 DAYS BEFORE ISSUING A FINAL NEGATIVE REPORT     Performed at [facility]   Report Status PENDING   Incomplete  CLOSTRIDIUM DIFFICILE BY PCR     Status: None   Collection Time    10/19/14  2:29 AM      Result Value Ref Range Status   C difficile by pcr NEGATIVE  NEGATIVE Final     Labs: Basic Metabolic Panel:  Recent Labs Lab 10/18/14 1743 10/19/14 0237 10/20/14 0327 10/21/14 0332 10/22/14 0405  NA 138 139 141 141 141  K 4.7 4.4 3.9 3.6* 3.7  CL 99 98 102 99 98  CO2 26 29 31 31 29   GLUCOSE 147* 231* 98 199* 80  BUN 15 19 22  25* 26*  CREATININE 1.31 1.58* 1.65* 1.67* 1.67*  CALCIUM 9.2 9.2 9.1 8.9 9.2   Liver Function Tests: No results found for this basename: AST, ALT, ALKPHOS, BILITOT, PROT, ALBUMIN,  in the last 168 hours No results found for this basename: LIPASE, AMYLASE,  in the last 168 hours No results found for this basename: AMMONIA,  in the last 168 hours CBC:  Recent Labs Lab 10/18/14 1743 10/19/14 0355  WBC 8.9 8.6  HGB 13.3 12.1*  HCT 40.3 37.5*  MCV 88.6 89.1  PLT 184 181   Cardiac Enzymes:  Recent Labs Lab 10/18/14 2130 10/19/14 0237 10/19/14 0824  TROPONINI <0.30 <0.30 <0.30   BNP: BNP (last 3 results)  Recent Labs  07/16/14 1305 08/25/14 1430 10/18/14 1743  PROBNP 6079.0* 2499.0* 12301.0*   CBG:  Recent Labs Lab 10/21/14 0520 10/21/14 1124 10/21/14 1609 10/21/14 2046 10/22/14 0606  GLUCAP 209* 95 153* 224* 87       SignedDebbe Odea, MD [facility] 10/22/2014, 10:02 AM

## 2014-10-25 LAB — CULTURE, BLOOD (ROUTINE X 2)
CULTURE: NO GROWTH
Culture: NO GROWTH

## 2014-11-20 ENCOUNTER — Encounter: Payer: Self-pay | Admitting: Cardiovascular Disease

## 2014-11-20 ENCOUNTER — Ambulatory Visit (INDEPENDENT_AMBULATORY_CARE_PROVIDER_SITE_OTHER): Payer: PRIVATE HEALTH INSURANCE | Admitting: Cardiovascular Disease

## 2014-11-20 VITALS — BP 115/62 | HR 96 | Ht 77.0 in | Wt 216.0 lb

## 2014-11-20 DIAGNOSIS — I4891 Unspecified atrial fibrillation: Secondary | ICD-10-CM

## 2014-11-20 NOTE — Patient Instructions (Addendum)
Your physician recommends that you schedule a follow-up appointment in: 6 WEEKS with Truitt Merle NP  Your physician recommends that you continue on your current medications as directed. Please refer to the Current Medication list given to you today.  Please verify that you are not taking both Carvedilol and Metoprolol.  If you are taking both of these medications then you need to stop Carvediolol and continue Metoprolol.

## 2014-11-20 NOTE — Progress Notes (Signed)
Background: The patient is followed for CAD with history of NSTEMI and PCI in 8850, diastolic heart failure, PAD with history of left fem-pop bypass, hypertension, CKD, atrial fibrillation, and alcohol/tobacco abuse. He has been hospitalized twice in the last 6 months for acutely decompensated congestive heart failure. He's received extensive education regarding medication adherence, avoidance of alcohol, and fluid/sodium restriction. He most recently was hospitalized in October at which time his diuretics were further increased. The patient also had a stroke this year, probably related atrial fibrillation. He is now anticoagulated with Eliquis.  HPI:  78 year old gentleman presenting for follow-up evaluation.  He's been doing okay since his last hospitalization. He has a nurse coming to his house to check on him. He continues to follow closely with Dr. Koleen Nimrod at the [facility]. He is back to smoking cigarettes and drinking alcohol again. He says that "if I'm going to go, I'm going to at least enjoy myself."  He denies chest pain, chest pressure, or resting shortness of breath. He has no orthopnea or PND. He's having problems with his scale at home, but does not think that his weight has changed significantly. He's trying to stay away from salt. His medications are essentially unchanged and he denies any bleeding problems on chronic oral anticoagulation.  Studies:  2-D echocardiogram 08/27/2014: Study Conclusions  - Left ventricle: There is mild global hypokinesis and akinesis in the basal inferior and inferolateral wall. The cavity size was mildly dilated. There was mild focal basal hypertrophy of the septum. Systolic function was mildly to moderately reduced. The estimated ejection fraction was in the range of 40% to 45%. Wall motion was normal; there were no regional wall motion abnormalities. The study was not technically sufficient to allow evaluation of LV diastolic  dysfunction due to atrial fibrillation. - Aortic valve: Trileaflet; mildly thickened, mildly calcified leaflets. Sclerosis without stenosis. There was trivial regurgitation. - Mitral valve: Calcified annulus. Mildly thickened leaflets . There was moderate regurgitation. - Left atrium: The atrium was severely dilated. - Right ventricle: Systolic function was mildly reduced. - Right atrium: The atrium was mildly dilated. - Tricuspid valve: There was mild regurgitation. - Pulmonary arteries: Systolic pressure was within the normal range. - Pericardium, extracardiac: There was no pericardial effusion.  Impressions:  - Mild LV dilatation with mild systolic dysfunction and regional wall motio abnormalities. Mild systolic RV dysfunction.  Outpatient Encounter Prescriptions as of 11/20/2014  Medication Sig  . albuterol (PROVENTIL HFA;VENTOLIN HFA) 108 (90 BASE) MCG/ACT inhaler Inhale 2 puffs into the lungs every 6 (six) hours as needed for shortness of breath.  Marland Kitchen apixaban (ELIQUIS) 5 MG TABS tablet Take 1 tablet (5 mg total) by mouth 2 (two) times daily.  Marland Kitchen atorvastatin (LIPITOR) 40 MG tablet Take 1 tablet (40 mg total) by mouth daily.  . carvedilol (COREG) 12.5 MG tablet Take 12.5 mg by mouth every morning.   . cilostazol (PLETAL) 50 MG tablet Take 50 mg by mouth 2 (two) times daily.  . divalproex (DEPAKOTE) 250 MG EC tablet Take 500 mg by mouth 2 (two) times daily.   . finasteride (PROSCAR) 5 MG tablet Take 5 mg by mouth daily.    . furosemide (LASIX) 40 MG tablet Take 2 tablets (80 mg total) by mouth 2 (two) times daily.  Marland Kitchen gabapentin (NEURONTIN) 300 MG capsule Take 300-600 mg by mouth 2 (two) times daily. 600mg  in the morning and 300mg  in the evening  . hydrALAZINE (APRESOLINE) 25 MG tablet Take 1 tablet (25 mg  total) by mouth every 8 (eight) hours.  . insulin glargine (LANTUS) 100 UNIT/ML injection Inject 40 Units into the skin daily.  . insulin regular (HUMULIN R,NOVOLIN  R) 100 units/mL injection Inject 8 Units into the skin 2 (two) times daily before a meal. Per sliding scale  . isosorbide mononitrate (IMDUR) 30 MG 24 hr tablet Take 1 tablet (30 mg total) by mouth daily.  . metoprolol (LOPRESSOR) 50 MG tablet Take 1 tablet (50 mg total) by mouth 2 (two) times daily.  . pantoprazole (PROTONIX) 20 MG tablet Take 20 mg by mouth daily.  . sildenafil (VIAGRA) 100 MG tablet Take 100 mg by mouth daily as needed for erectile dysfunction.  Marland Kitchen spironolactone (ALDACTONE) 25 MG tablet Take 1 tablet (25 mg total) by mouth daily.  . Tamsulosin HCl (FLOMAX) 0.4 MG CAPS Take 0.4 mg by mouth daily.    . travoprost, benzalkonium, (TRAVATAN) 0.004 % ophthalmic solution Place 1 drop into both eyes at bedtime.   . [DISCONTINUED] furosemide (LASIX) 40 MG tablet Take 40 mg by mouth 2 (two) times daily.   . [DISCONTINUED] spironolactone (ALDACTONE) 25 MG tablet Take 25 mg by mouth daily.    Allergies  Allergen Reactions  . Penicillins Other (See Comments)     hands and feet edema    Past Medical History  Diagnosis Date  . Hyperlipidemia   . HTN (hypertension)     echo 2/11: EF 50-55%, diast dyfxn, severe LVH, inf HK, LAE  . CAD (coronary artery disease)     a.  NSTEMI treated with PCI Feb 2011 with a DES.([facility]);   b. cath 2/11: D1 stents x 2 ok, AV groove CFX occluded with dist AV CFX filled by L-L collats, RCA occluded, OM2 90-95% (treated with PCI)  . MI (myocardial infarction)   . PVD (peripheral vascular disease)   . DM (diabetes mellitus)   . GERD (gastroesophageal reflux disease)   . Bipolar disorder   . Prostate cancer     status post radiation therapy in 2003  . Pulmonary edema   . Atrial fibrillation   . Respiratory difficulty 06/24/2014    intubated   . Hypertensive emergency 06/24/2014  . CKD (chronic kidney disease), stage III     family history includes Cancer in his father, mother, and sister.   ROS: Negative except as per HPI  BP 115/62 mmHg   Pulse 96  Ht 6\' 5"  (1.956 m)  Wt 216 lb (97.977 kg)  BMI 25.61 kg/m2  PHYSICAL EXAM: Pt is alert and oriented, NAD HEENT: normal Neck: JVP - normal, carotids 2+= without bruits Lungs: CTA bilaterally CV:  Irregularly irregularwithout murmur or gallop Abd: soft, NT, Positive BS, no hepatomegaly Ext:  Trace edema on the right, 1+ edema on the left Skin: warm/dry no rash  EKG:   Atrial fibrillation 102 bpm, right bundle branch block.  ASSESSMENT AND PLAN:  1. Atrial fibrillation.  Heart rate is controlled with metoprolol 50 mg twice a day. While his heart rate is mildly elevated today, I do not think he will tolerate further med titration because of low blood pressure. He's previously been symptomatic with hypotension. I also would avoid digoxin in this patient with polypharmacy and chronic kidney disease. He should continue with lifelong anticoagulation. I would like him to see Truitt Merle in about 6 weeks. We'll make sure that we forward our records to Dr. Koleen Nimrod at the [name].    2. Chronic mixed systolic and diastolic heart failure. Overall he  seems stable on his current medical regimen which includes twice daily furosemide and once daily Aldactone. Importance of sodium restriction was reinforced with the patient.   3. Coronary artery disease , native vessel, without symptoms of angina. Continue medical therapy.   4. Type 2 diabetes with peripheral vascular disease and cerebrovascular disease. Followed by primary care. Managed with insulin.  5. Tobacco/alcohol abuse. We discussed the importance of cessation. Also discussed his philosophical approach and I certainly understand why he feels the way he does about his current quality of life. However, I stressed to him that he will do poorly from a cardiac perspective he continues on with alcohol and tobacco.    Sherren Mocha, MD 11/20/2014 2:12 PM

## 2015-01-07 ENCOUNTER — Ambulatory Visit: Payer: Medicaid Other | Admitting: Nurse Practitioner

## 2015-01-15 ENCOUNTER — Ambulatory Visit: Payer: Medicaid Other | Admitting: Nurse Practitioner

## 2015-04-16 ENCOUNTER — Telehealth: Payer: Self-pay | Admitting: Cardiovascular Disease

## 2015-04-16 NOTE — Telephone Encounter (Signed)
Received medical records from Jilda Panda, MD. Sent to Dr. Burt Knack. 04/16/15/ss

## 2015-11-28 ENCOUNTER — Emergency Department (HOSPITAL_COMMUNITY): Payer: Medicare Other

## 2015-11-28 ENCOUNTER — Inpatient Hospital Stay (HOSPITAL_COMMUNITY)
Admission: EM | Admit: 2015-11-28 | Discharge: 2015-11-29 | DRG: 378 | Disposition: A | Discharge status: Home / Self Care | Payer: Medicare Other | Attending: Internal Medicine | Admitting: Internal Medicine

## 2015-11-28 ENCOUNTER — Encounter (HOSPITAL_COMMUNITY): Payer: Self-pay | Admitting: *Deleted

## 2015-11-28 DIAGNOSIS — N390 Urinary tract infection, site not specified: Secondary | ICD-10-CM | POA: Diagnosis present

## 2015-11-28 DIAGNOSIS — I130 Hypertensive heart and chronic kidney disease with heart failure and stage 1 through stage 4 chronic kidney disease, or unspecified chronic kidney disease: Secondary | ICD-10-CM | POA: Diagnosis present

## 2015-11-28 DIAGNOSIS — E1122 Type 2 diabetes mellitus with diabetic chronic kidney disease: Secondary | ICD-10-CM | POA: Diagnosis present

## 2015-11-28 DIAGNOSIS — I5022 Chronic systolic (congestive) heart failure: Secondary | ICD-10-CM | POA: Diagnosis present

## 2015-11-28 DIAGNOSIS — F319 Bipolar disorder, unspecified: Secondary | ICD-10-CM | POA: Diagnosis present

## 2015-11-28 DIAGNOSIS — K219 Gastro-esophageal reflux disease without esophagitis: Secondary | ICD-10-CM | POA: Diagnosis present

## 2015-11-28 DIAGNOSIS — K922 Gastrointestinal hemorrhage, unspecified: Secondary | ICD-10-CM | POA: Diagnosis present

## 2015-11-28 DIAGNOSIS — Z955 Presence of coronary angioplasty implant and graft: Secondary | ICD-10-CM

## 2015-11-28 DIAGNOSIS — N183 Chronic kidney disease, stage 3 (moderate): Secondary | ICD-10-CM | POA: Diagnosis present

## 2015-11-28 DIAGNOSIS — Z794 Long term (current) use of insulin: Secondary | ICD-10-CM | POA: Diagnosis not present

## 2015-11-28 DIAGNOSIS — Z8673 Personal history of transient ischemic attack (TIA), and cerebral infarction without residual deficits: Secondary | ICD-10-CM

## 2015-11-28 DIAGNOSIS — Z79899 Other long term (current) drug therapy: Secondary | ICD-10-CM

## 2015-11-28 DIAGNOSIS — I4891 Unspecified atrial fibrillation: Secondary | ICD-10-CM | POA: Diagnosis present

## 2015-11-28 DIAGNOSIS — C3490 Malignant neoplasm of unspecified part of unspecified bronchus or lung: Secondary | ICD-10-CM | POA: Diagnosis present

## 2015-11-28 DIAGNOSIS — I739 Peripheral vascular disease, unspecified: Secondary | ICD-10-CM | POA: Diagnosis present

## 2015-11-28 DIAGNOSIS — Z8546 Personal history of malignant neoplasm of prostate: Secondary | ICD-10-CM | POA: Diagnosis not present

## 2015-11-28 DIAGNOSIS — N179 Acute kidney failure, unspecified: Secondary | ICD-10-CM | POA: Diagnosis present

## 2015-11-28 DIAGNOSIS — B3781 Candidal esophagitis: Secondary | ICD-10-CM | POA: Diagnosis present

## 2015-11-28 DIAGNOSIS — N400 Enlarged prostate without lower urinary tract symptoms: Secondary | ICD-10-CM | POA: Diagnosis present

## 2015-11-28 DIAGNOSIS — F17210 Nicotine dependence, cigarettes, uncomplicated: Secondary | ICD-10-CM | POA: Diagnosis present

## 2015-11-28 DIAGNOSIS — Z923 Personal history of irradiation: Secondary | ICD-10-CM | POA: Diagnosis not present

## 2015-11-28 DIAGNOSIS — I252 Old myocardial infarction: Secondary | ICD-10-CM

## 2015-11-28 DIAGNOSIS — D649 Anemia, unspecified: Secondary | ICD-10-CM | POA: Diagnosis not present

## 2015-11-28 DIAGNOSIS — E785 Hyperlipidemia, unspecified: Secondary | ICD-10-CM | POA: Diagnosis present

## 2015-11-28 DIAGNOSIS — E1140 Type 2 diabetes mellitus with diabetic neuropathy, unspecified: Secondary | ICD-10-CM | POA: Diagnosis present

## 2015-11-28 DIAGNOSIS — K921 Melena: Secondary | ICD-10-CM | POA: Diagnosis present

## 2015-11-28 DIAGNOSIS — I2510 Atherosclerotic heart disease of native coronary artery without angina pectoris: Secondary | ICD-10-CM | POA: Diagnosis present

## 2015-11-28 DIAGNOSIS — D638 Anemia in other chronic diseases classified elsewhere: Secondary | ICD-10-CM | POA: Diagnosis present

## 2015-11-28 DIAGNOSIS — Z7901 Long term (current) use of anticoagulants: Secondary | ICD-10-CM

## 2015-11-28 HISTORY — DX: Anemia, unspecified: D64.9

## 2015-11-28 HISTORY — DX: Reserved for inherently not codable concepts without codable children: IMO0001

## 2015-11-28 HISTORY — DX: Cerebral infarction, unspecified: I63.9

## 2015-11-28 LAB — COMPREHENSIVE METABOLIC PANEL
ALT: 10 U/L — AB (ref 17–63)
AST: 20 U/L (ref 15–41)
Albumin: 2.8 g/dL — ABNORMAL LOW (ref 3.5–5.0)
Alkaline Phosphatase: 49 U/L (ref 38–126)
Anion gap: 6 (ref 5–15)
BUN: 29 mg/dL — ABNORMAL HIGH (ref 6–20)
CALCIUM: 9.3 mg/dL (ref 8.9–10.3)
CO2: 30 mmol/L (ref 22–32)
CREATININE: 2.15 mg/dL — AB (ref 0.61–1.24)
Chloride: 102 mmol/L (ref 101–111)
GFR calc Af Amer: 32 mL/min — ABNORMAL LOW (ref >60)
GFR calc non Af Amer: 28 mL/min — ABNORMAL LOW (ref >60)
Glucose, Bld: 293 mg/dL — ABNORMAL HIGH (ref 65–99)
Potassium: 4.6 mmol/L (ref 3.5–5.1)
Sodium: 138 mmol/L (ref 135–145)
Total Bilirubin: 0.5 mg/dL (ref 0.3–1.2)
Total Protein: 7.2 g/dL (ref 6.5–8.1)

## 2015-11-28 LAB — CBC
HCT: 29.4 % — ABNORMAL LOW (ref 39.0–52.0)
Hemoglobin: 9.6 g/dL — ABNORMAL LOW (ref 13.0–17.0)
MCH: 31.2 pg (ref 26.0–34.0)
MCHC: 32.7 g/dL (ref 30.0–36.0)
MCV: 95.5 fL (ref 78.0–100.0)
Platelets: 218 10*3/uL (ref 150–400)
RBC: 3.08 MIL/uL — AB (ref 4.22–5.81)
RDW: 16.3 % — ABNORMAL HIGH (ref 11.5–15.5)
WBC: 5.2 10*3/uL (ref 4.0–10.5)

## 2015-11-28 LAB — PROTIME-INR
INR: 1.36 (ref 0.00–1.49)
Prothrombin Time: 16.9 seconds — ABNORMAL HIGH (ref 11.6–15.2)

## 2015-11-28 LAB — GLUCOSE, CAPILLARY
Glucose-Capillary: 80 mg/dL (ref 65–99)
Glucose-Capillary: 174 mg/dL — ABNORMAL HIGH (ref 65–99)

## 2015-11-28 LAB — TYPE AND SCREEN
ABO/RH(D): O POS
Antibody Screen: NEGATIVE

## 2015-11-28 LAB — ABO/RH: ABO/RH(D): O POS

## 2015-11-28 LAB — POC OCCULT BLOOD, ED: FECAL OCCULT BLD: POSITIVE — AB

## 2015-11-28 MED ORDER — DIVALPROEX SODIUM 250 MG PO DR TAB
250.0000 mg | DELAYED_RELEASE_TABLET | Freq: Two times a day (BID) | ORAL | Status: DC
  Administered 2015-11-28 – 2015-11-29 (×2): 250 mg via ORAL
  Filled 2015-11-28 (×3): qty 1

## 2015-11-28 MED ORDER — ENSURE ENLIVE PO LIQD: 237.0000 mL | Freq: Two times a day (BID) | ORAL | Status: DC

## 2015-11-28 MED ORDER — METOPROLOL TARTRATE 50 MG PO TABS
50.0000 mg | ORAL_TABLET | Freq: Two times a day (BID) | ORAL | Status: DC
  Administered 2015-11-28 – 2015-11-29 (×2): 50 mg via ORAL
  Filled 2015-11-28 (×2): qty 1

## 2015-11-28 MED ORDER — LATANOPROST 0.005 % OP SOLN
1.0000 [drp] | Freq: Every day | OPHTHALMIC | Status: DC
  Administered 2015-11-28: 1 [drp] via OPHTHALMIC
  Filled 2015-11-28: qty 2.5

## 2015-11-28 MED ORDER — CIPROFLOXACIN HCL 500 MG PO TABS
500.0000 mg | ORAL_TABLET | Freq: Two times a day (BID) | ORAL | Status: DC
  Administered 2015-11-28 – 2015-11-29 (×2): 500 mg via ORAL
  Filled 2015-11-28 (×2): qty 1

## 2015-11-28 MED ORDER — INSULIN ASPART 100 UNIT/ML ~~LOC~~ SOLN
0.0000 [IU] | Freq: Three times a day (TID) | SUBCUTANEOUS | Status: DC
  Administered 2015-11-29: 8 [IU] via SUBCUTANEOUS
  Administered 2015-11-29: 5 [IU] via SUBCUTANEOUS

## 2015-11-28 MED ORDER — THIAMINE HCL 100 MG/ML IJ SOLN
100.0000 mg | Freq: Every day | INTRAMUSCULAR | Status: DC
Start: 2015-11-28 — End: 2015-11-29

## 2015-11-28 MED ORDER — PANTOPRAZOLE SODIUM 20 MG PO TBEC: 20.0000 mg | DELAYED_RELEASE_TABLET | Freq: Every day | ORAL | Status: DC

## 2015-11-28 MED ORDER — VITAMIN D 1000 UNITS PO TABS
1000.0000 [IU] | ORAL_TABLET | Freq: Every day | ORAL | Status: DC
  Filled 2015-11-28: qty 1

## 2015-11-28 MED ORDER — ALBUTEROL SULFATE (2.5 MG/3ML) 0.083% IN NEBU: 2.5000 mg | INHALATION_SOLUTION | Freq: Four times a day (QID) | RESPIRATORY_TRACT | Status: DC | PRN

## 2015-11-28 MED ORDER — SODIUM CHLORIDE 0.9 % IJ SOLN: 3.0000 mL | INTRAMUSCULAR | Status: DC | PRN

## 2015-11-28 MED ORDER — VITAMIN B-1 100 MG PO TABS
100.0000 mg | ORAL_TABLET | Freq: Every day | ORAL | Status: DC
  Administered 2015-11-28: 100 mg via ORAL
  Filled 2015-11-28: qty 1

## 2015-11-28 MED ORDER — INSULIN GLARGINE 100 UNIT/ML ~~LOC~~ SOLN: 40.0000 [IU] | Freq: Every day | SUBCUTANEOUS | Status: DC

## 2015-11-28 MED ORDER — ADULT MULTIVITAMIN W/MINERALS CH
1.0000 | ORAL_TABLET | Freq: Every day | ORAL | Status: DC
  Administered 2015-11-28: 1 via ORAL
  Filled 2015-11-28: qty 1

## 2015-11-28 MED ORDER — FOLIC ACID 1 MG PO TABS
1.0000 mg | ORAL_TABLET | Freq: Every day | ORAL | Status: DC
  Administered 2015-11-28: 1 mg via ORAL
  Filled 2015-11-28: qty 1

## 2015-11-28 MED ORDER — FUROSEMIDE 80 MG PO TABS
80.0000 mg | ORAL_TABLET | Freq: Every day | ORAL | Status: DC
  Administered 2015-11-29: 80 mg via ORAL
  Filled 2015-11-28 (×2): qty 1

## 2015-11-28 MED ORDER — ACETAMINOPHEN 325 MG PO TABS
650.0000 mg | ORAL_TABLET | Freq: Four times a day (QID) | ORAL | Status: DC | PRN
  Administered 2015-11-28: 650 mg via ORAL
  Filled 2015-11-28: qty 2

## 2015-11-28 MED ORDER — TRAVOPROST (BAK FREE) 0.004 % OP SOLN
1.0000 [drp] | Freq: Every day | OPHTHALMIC | Status: DC
  Administered 2015-11-28: 1 [drp] via OPHTHALMIC
  Filled 2015-11-28: qty 2.5

## 2015-11-28 MED ORDER — TAMSULOSIN HCL 0.4 MG PO CAPS
0.4000 mg | ORAL_CAPSULE | Freq: Every day | ORAL | Status: DC
  Administered 2015-11-29: 0.4 mg via ORAL
  Filled 2015-11-28 (×2): qty 1

## 2015-11-28 MED ORDER — LORAZEPAM 1 MG PO TABS
1.0000 mg | ORAL_TABLET | Freq: Four times a day (QID) | ORAL | Status: DC | PRN
Start: 2015-11-28 — End: 2015-11-29

## 2015-11-28 MED ORDER — GABAPENTIN 300 MG PO CAPS
600.0000 mg | ORAL_CAPSULE | Freq: Every day | ORAL | Status: DC
  Administered 2015-11-29: 600 mg via ORAL
  Filled 2015-11-28: qty 2

## 2015-11-28 MED ORDER — LISINOPRIL 10 MG PO TABS
10.0000 mg | ORAL_TABLET | Freq: Every day | ORAL | Status: DC
  Administered 2015-11-29: 10 mg via ORAL
  Filled 2015-11-28 (×2): qty 1

## 2015-11-28 MED ORDER — SODIUM CHLORIDE 0.9 % IV SOLN
250.0000 mL | INTRAVENOUS | Status: DC | PRN
  Administered 2015-11-29: 250 mL via INTRAVENOUS
  Administered 2015-11-29: 15:00:00 via INTRAVENOUS

## 2015-11-28 MED ORDER — LORAZEPAM 2 MG/ML IJ SOLN: 1.0000 mg | Freq: Four times a day (QID) | INTRAMUSCULAR | Status: DC | PRN

## 2015-11-28 MED ORDER — PANTOPRAZOLE SODIUM 40 MG PO TBEC
40.0000 mg | DELAYED_RELEASE_TABLET | Freq: Two times a day (BID) | ORAL | Status: DC
  Administered 2015-11-28 – 2015-11-29 (×2): 40 mg via ORAL
  Filled 2015-11-28 (×2): qty 1

## 2015-11-28 MED ORDER — INSULIN GLARGINE 100 UNIT/ML ~~LOC~~ SOLN
30.0000 [IU] | Freq: Every day | SUBCUTANEOUS | Status: DC
Start: 2015-11-29 — End: 2015-11-29
  Filled 2015-11-28: qty 0.3

## 2015-11-28 MED ORDER — GABAPENTIN 300 MG PO CAPS
300.0000 mg | ORAL_CAPSULE | Freq: Every day | ORAL | Status: DC
  Administered 2015-11-28: 300 mg via ORAL
  Filled 2015-11-28: qty 1

## 2015-11-28 MED ORDER — VITAMIN B-12 1000 MCG PO TABS
1000.0000 ug | ORAL_TABLET | Freq: Every day | ORAL | Status: DC
  Administered 2015-11-29: 1000 ug via ORAL
  Filled 2015-11-28 (×2): qty 1

## 2015-11-28 MED ORDER — ACETAMINOPHEN 650 MG RE SUPP: 650.0000 mg | Freq: Four times a day (QID) | RECTAL | Status: DC | PRN

## 2015-11-28 MED ORDER — SODIUM CHLORIDE 0.9 % IJ SOLN
3.0000 mL | Freq: Two times a day (BID) | INTRAMUSCULAR | Status: DC
  Administered 2015-11-28 – 2015-11-29 (×2): 3 mL via INTRAVENOUS

## 2015-11-28 MED ORDER — HYDRALAZINE HCL 25 MG PO TABS
25.0000 mg | ORAL_TABLET | Freq: Two times a day (BID) | ORAL | Status: DC
  Administered 2015-11-28 – 2015-11-29 (×2): 25 mg via ORAL
  Filled 2015-11-28 (×2): qty 1

## 2015-11-28 MED ORDER — CILOSTAZOL 50 MG PO TABS
50.0000 mg | ORAL_TABLET | Freq: Two times a day (BID) | ORAL | Status: DC
  Administered 2015-11-28 – 2015-11-29 (×2): 50 mg via ORAL
  Filled 2015-11-28 (×3): qty 1

## 2015-11-28 MED ORDER — ATORVASTATIN CALCIUM 40 MG PO TABS
40.0000 mg | ORAL_TABLET | Freq: Every day | ORAL | Status: DC
  Filled 2015-11-28: qty 1

## 2015-11-28 MED ORDER — FINASTERIDE 5 MG PO TABS
5.0000 mg | ORAL_TABLET | Freq: Every day | ORAL | Status: DC
  Filled 2015-11-28: qty 1

## 2015-11-28 NOTE — ED Notes (Signed)
Meal tray ordered 

## 2015-11-28 NOTE — Consult Note (Signed)
Subjective:   HPI  The patient is a 79 year old male who a couple of days ago was at the [facility] in [facility]. He states that they checked his blood and his blood count was low. He was told that if he started to have black stools or see any signs of bleeding to go to the hospital. They mentioned do an an EGD. He had not seen any blood or black stools until this morning when he started to experience black tarry stools. He denies abdominal pain or vomiting or hematemesis. He has never had a peptic ulcer. He states he had a colonoscopy about 4 years ago at the [place].  Review of Systems Denies chest pain or shortness of breath  Past Medical History  Diagnosis Date  . Hyperlipidemia   . HTN (hypertension)     echo 2/11: EF 50-55%, diast dyfxn, severe LVH, inf HK, LAE  . CAD (coronary artery disease)     a.  NSTEMI treated with PCI Feb 2011 with a DES.([facility]);   b. cath 2/11: D1 stents x 2 ok, AV groove CFX occluded with dist AV CFX filled by L-L collats, RCA occluded, OM2 90-95% (treated with PCI)  . MI (myocardial infarction) ([place])   . PVD (peripheral vascular disease) ([place])   . DM (diabetes mellitus) ([place])   . GERD (gastroesophageal reflux disease)   . Bipolar disorder ([place])   . Prostate cancer [facility])     status post radiation therapy in 2003  . Pulmonary edema   . Atrial fibrillation ([place])   . Respiratory difficulty 06/24/2014    intubated   . Hypertensive emergency 06/24/2014  . CKD (chronic kidney disease), stage III   . Anemia 11/28/2015  . Shortness of breath dyspnea   . Stroke [facility]) 07/2014   Past Surgical History  Procedure Laterality Date  . Femoral bypass  2003   Social History   Social History  . Marital Status: Divorced    Spouse Name: N/A  . Number of Children: N/A  . Years of Education: N/A   Occupational History  . retired Architect    Social History Main Topics  . Smoking status: Current Every Day Smoker -- 0.50 packs/day for 50 years   Types: Cigarettes  . Smokeless tobacco: Never Used     Comment: He has a 50-pack-year hx of tobacco abuse. Currently, smoking about half a pack a day.  . Alcohol Use: Yes     Comment: Previously drank heavily, and now has a drink every other day or so.  . Drug Use: No  . Sexual Activity: Not on file   Other Topics Concern  . Not on file   Social History Narrative   family history includes Cancer in his father, mother, and sister.  Current facility-administered medications:  .  0.9 %  sodium chloride infusion, 250 mL, Intravenous, PRN, Alexa Sherral Hammers, MD .  acetaminophen (TYLENOL) tablet 650 mg, 650 mg, Oral, Q6H PRN **OR** acetaminophen (TYLENOL) suppository 650 mg, 650 mg, Rectal, Q6H PRN, Alexa Sherral Hammers, MD .  albuterol (PROVENTIL) (2.5 MG/3ML) 0.083% nebulizer solution 2.5 mg, 2.5 mg, Inhalation, Q6H PRN, Vickii Chafe, MD .  Derrill Memo ON 11/29/2015] atorvastatin (LIPITOR) tablet 40 mg, 40 mg, Oral, Daily, Alexa Sherral Hammers, MD .  Derrill Memo ON 11/29/2015] cholecalciferol (VITAMIN D) tablet 1,000 Units, 1,000 Units, Oral, Daily, Alexa Sherral Hammers, MD .  cilostazol (PLETAL) tablet 50 mg, 50 mg, Oral, BID, Alexa Sherral Hammers, MD .  ciprofloxacin (CIPRO)  tablet 500 mg, 500 mg, Oral, BID, Alexa Sherral Hammers, MD .  divalproex (DEPAKOTE) DR tablet 250 mg, 250 mg, Oral, BID, Alexa Sherral Hammers, MD .  Derrill Memo ON 11/29/2015] finasteride (PROSCAR) tablet 5 mg, 5 mg, Oral, Daily, Alexa Sherral Hammers, MD .  folic acid (FOLVITE) tablet 1 mg, 1 mg, Oral, Daily, Alexa Sherral Hammers, MD .  Derrill Memo ON 11/29/2015] furosemide (LASIX) tablet 80 mg, 80 mg, Oral, Daily, Alexa Sherral Hammers, MD .  gabapentin (NEURONTIN) capsule 300 mg, 300 mg, Oral, QHS, Bartholomew Crews, MD .  Derrill Memo ON 11/29/2015] gabapentin (NEURONTIN) capsule 600 mg, 600 mg, Oral, Daily, Alexa Sherral Hammers, MD .  hydrALAZINE (APRESOLINE) tablet 25 mg, 25 mg, Oral, BID, Alexa Sherral Hammers, MD .  insulin aspart (novoLOG) injection 0-15  Units, 0-15 Units, Subcutaneous, TID WC, Alexa Sherral Hammers, MD .  Derrill Memo ON 11/29/2015] insulin glargine (LANTUS) injection 40 Units, 40 Units, Subcutaneous, Daily, Alexa Sherral Hammers, MD .  latanoprost (XALATAN) 0.005 % ophthalmic solution 1 drop, 1 drop, Both Eyes, QHS, Alexa Sherral Hammers, MD .  Derrill Memo ON 11/29/2015] lisinopril (PRINIVIL,ZESTRIL) tablet 10 mg, 10 mg, Oral, Daily, Alexa Sherral Hammers, MD .  LORazepam (ATIVAN) tablet 1 mg, 1 mg, Oral, Q6H PRN **OR** LORazepam (ATIVAN) injection 1 mg, 1 mg, Intravenous, Q6H PRN, Alexa Sherral Hammers, MD .  metoprolol (LOPRESSOR) tablet 50 mg, 50 mg, Oral, BID, Alexa Sherral Hammers, MD .  multivitamin with minerals tablet 1 tablet, 1 tablet, Oral, Daily, Alexa Sherral Hammers, MD .  Derrill Memo ON 11/29/2015] pantoprazole (PROTONIX) EC tablet 20 mg, 20 mg, Oral, Daily, Alexa Sherral Hammers, MD .  sodium chloride 0.9 % injection 3 mL, 3 mL, Intravenous, Q12H, Alexa Sherral Hammers, MD .  sodium chloride 0.9 % injection 3 mL, 3 mL, Intravenous, PRN, Alexa Sherral Hammers, MD .  Derrill Memo ON 11/29/2015] tamsulosin (FLOMAX) capsule 0.4 mg, 0.4 mg, Oral, Daily, Alexa Sherral Hammers, MD .  thiamine (VITAMIN B-1) tablet 100 mg, 100 mg, Oral, Daily **OR** thiamine (B-1) injection 100 mg, 100 mg, Intravenous, Daily, Alexa Sherral Hammers, MD .  Travoprost (BAK Free) (TRAVATAN) 0.004 % ophthalmic solution SOLN 1 drop, 1 drop, Both Eyes, QHS, Alexa Sherral Hammers, MD .  Derrill Memo ON 11/29/2015] vitamin B-12 (CYANOCOBALAMIN) tablet 1,000 mcg, 1,000 mcg, Oral, Daily, Alexa Sherral Hammers, MD Allergies  Allergen Reactions  . Penicillins Other (See Comments)    Has patient had a PCN reaction causing immediate rash, facial/tongue/throat swelling, SOB or lightheadedness with hypotension: unknown Has patient had a PCN reaction causing severe rash involving mucus membranes or skin necrosis: unknown Has patient had a PCN reaction that required hospitalization unknown Has patient had a PCN reaction occurring  within the last 10 years: unknown If all of the above answers are "NO", then may proceed with Cephalosporin use.     Objective:     BP 176/82 mmHg  Pulse 97  Temp(Src) 98 F (36.7 C) (Oral)  Resp 20  SpO2 96%  He is in no distress  Nonicteric  Heart regular rhythm no murmurs  Lungs clear  Abdomen: Bowel sounds normal, soft, nontender  Laboratory No components found for: D1    Assessment:     Melena  Anemia      Plan:     We will set him up for EGD tomorrow. He should be on PPI therapy at this time. Lab Results  Component Value Date   HGB 9.6* 11/28/2015   HGB 12.1* 10/19/2014   HGB 13.3 10/18/2014  HCT 29.4* 11/28/2015   HCT 37.5* 10/19/2014   HCT 40.3 10/18/2014   ALKPHOS 49 11/28/2015   ALKPHOS 35* 08/25/2014   ALKPHOS 43 06/26/2014   AST 20 11/28/2015   AST 14 08/25/2014   AST 23 06/26/2014   ALT 10* 11/28/2015   ALT 5 08/25/2014   ALT 21 06/26/2014

## 2015-11-28 NOTE — H&P (Signed)
Date: 11/28/2015               Patient Name:  [name] MRN: [id number]  DOB: [birth year] Age / Sex: 79 y.o., male   PCP: Jilda Panda, MD         Medical Service: Internal Medicine Teaching Service         Attending Physician: Dr. Bartholomew Crews, MD    First Contact: Dr. Liberty Handy, MD Pager: [phone]  Second Contact: Dr. Osa Craver, DO Pager: [phone]       After Hours (After 5p/  First Contact Pager: [phone]  weekends / holidays): Second Contact Pager: [phone]   Chief Complaint: Dark Stool   History of Present Illness:   Mr. Lamere is a 79 year old man with a past medical history of atrial fibrillation (CHA2DS2-VASc 8 on Eliquis), previous CVA with minimal residual deficits, CHF (EF 50-55% 05/2014), CKD Stage 3, Bipolar disorder, HTN, PVD, and unknown lung cancer (just completed radiation course at [place]), who presents with dark, tarry stool. He had gone to his PCP at the [place], who noticed that his blood counts were low, and he was told to go to the hospital if his stools were dark. Yesterday, he thought to check, and noticed his stools were dark. He endorses some dyspnea on exertion (different from his previous CHF exacerbations), but he denies any light-headedness, chest pain, hematochezia or abdominal pain. He reports taking ibuprofen at least once a month, but no more than two pills in a day. He drinks 1/2 gallon of bourbon a week (converts to 6 shots/day), and he denies any withdrawal symptoms. Last drink was yesterday. He had a colonoscopy "3 or 4 years ago" at the [place], saying that "they found something" and was told to follow-up in five years. He reports some unintended weight loss 6 months ago, when he went from 230 lbs to 190 lbs which was associated with decreased appetite. Notably, this preceded the identification of his lung cancer, and he has had an improved appetite since finishing his radiation course last week - he has a chronic dry cough without hemoptysis. He  smokes 1ppd with a 50 pack-year history - he has tried patches before but they irritate his skin. 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 He complains of increased urinary frequency and dysuria, for which he has taken 3 doses of ciprofloxacin thus far.   In the ED, he was noted have a Hgb of 9.6 with a positive FOBT, Hgb was 12.1 last year. He was hemodynamically stable.   Meds: Current Facility-Administered Medications  Medication Dose Route Frequency Provider Last Rate Last Dose  . 0.9 %  sodium chloride infusion  250 mL Intravenous PRN Alexa Sherral Hammers, MD      . acetaminophen (TYLENOL) tablet 650 mg  650 mg Oral Q6H PRN Alexa Sherral Hammers, MD       Or  . acetaminophen (TYLENOL) suppository 650 mg  650 mg Rectal Q6H PRN Alexa Sherral Hammers, MD      . albuterol (PROVENTIL HFA;VENTOLIN HFA) 108 (90 BASE) MCG/ACT inhaler 2 puff  2 puff Inhalation Q6H PRN Alexa Sherral Hammers, MD      . atorvastatin (LIPITOR) tablet 40 mg  40 mg Oral Daily Alexa Sherral Hammers, MD      . cholecalciferol (VITAMIN D) tablet 1,000 Units  1,000 Units Oral Daily Alexa Sherral Hammers, MD      . cilostazol (PLETAL) tablet 50 mg  50  mg Oral BID Alexa Sherral Hammers, MD      . ciprofloxacin (CIPRO) tablet 500 mg  500 mg Oral BID Alexa Sherral Hammers, MD      . divalproex (DEPAKOTE) DR tablet 250 mg  250 mg Oral BID Alexa Sherral Hammers, MD      . finasteride (PROSCAR) tablet 5 mg  5 mg Oral Daily Alexa Sherral Hammers, MD      . folic acid (FOLVITE) tablet 1 mg  1 mg Oral Daily Alexa Sherral Hammers, MD      . Derrill Memo ON 11/29/2015] furosemide (LASIX) tablet 80 mg  80 mg Oral Daily Alexa Sherral Hammers, MD      . gabapentin (NEURONTIN) capsule 300 mg  300 mg Oral BID Alexa Sherral Hammers, MD      . hydrALAZINE (APRESOLINE) tablet 25 mg  25 mg Oral BID Alexa Sherral Hammers, MD      . insulin aspart (novoLOG) injection 0-15 Units  0-15 Units Subcutaneous TID WC Alexa Sherral Hammers, MD      . Derrill Memo ON 11/29/2015] insulin  glargine (LANTUS) injection 40 Units  40 Units Subcutaneous Daily Alexa Sherral Hammers, MD      . latanoprost (XALATAN) 0.005 % ophthalmic solution 1 drop  1 drop Both Eyes QHS Alexa Sherral Hammers, MD      . Derrill Memo ON 11/29/2015] lisinopril (PRINIVIL,ZESTRIL) tablet 10 mg  10 mg Oral Daily Alexa Sherral Hammers, MD      . LORazepam (ATIVAN) tablet 1 mg  1 mg Oral Q6H PRN Alexa Sherral Hammers, MD       Or  . LORazepam (ATIVAN) injection 1 mg  1 mg Intravenous Q6H PRN Alexa Sherral Hammers, MD      . metoprolol (LOPRESSOR) tablet 50 mg  50 mg Oral BID Alexa Sherral Hammers, MD      . multivitamin with minerals tablet 1 tablet  1 tablet Oral Daily Alexa Sherral Hammers, MD      . pantoprazole (PROTONIX) EC tablet 20 mg  20 mg Oral Daily Alexa Sherral Hammers, MD      . sodium chloride 0.9 % injection 3 mL  3 mL Intravenous Q12H Alexa Sherral Hammers, MD      . sodium chloride 0.9 % injection 3 mL  3 mL Intravenous PRN Alexa Sherral Hammers, MD      . Derrill Memo ON 11/29/2015] tamsulosin (FLOMAX) capsule 0.4 mg  0.4 mg Oral Daily Alexa Sherral Hammers, MD      . thiamine (VITAMIN B-1) tablet 100 mg  100 mg Oral Daily Alexa Sherral Hammers, MD       Or  . thiamine (B-1) injection 100 mg  100 mg Intravenous Daily Alexa Sherral Hammers, MD      . travoprost (benzalkonium) (TRAVATAN) 0.004 % ophthalmic solution 1 drop  1 drop Both Eyes QHS Alexa Sherral Hammers, MD      . vitamin B-12 (CYANOCOBALAMIN) tablet 1,000 mcg  1,000 mcg Oral Daily Alexa Sherral Hammers, MD        Allergies: Allergies as of 11/28/2015 - Review Complete 11/28/2015  Allergen Reaction Noted  . Penicillins Other (See Comments) 03/11/2010   Past Medical History  Diagnosis Date  . Hyperlipidemia   . HTN (hypertension)     echo 2/11: EF 50-55%, diast dyfxn, severe LVH, inf HK, LAE  . CAD (coronary artery disease)     a.  NSTEMI treated with PCI Feb 2011 with a DES.([facility]);   b. cath 2/11: D1 stents x 2 ok, AV  groove CFX occluded with dist AV CFX filled by L-L collats,  RCA occluded, OM2 90-95% (treated with PCI)  . MI (myocardial infarction) ([place])   . PVD (peripheral vascular disease) ([place])   . DM (diabetes mellitus) ([place])   . GERD (gastroesophageal reflux disease)   . Bipolar disorder ([place])   . Prostate cancer [facility])     status post radiation therapy in 2003  . Pulmonary edema   . Atrial fibrillation ([place])   . Respiratory difficulty 06/24/2014    intubated   . Hypertensive emergency 06/24/2014  . CKD (chronic kidney disease), stage III    Past Surgical History  Procedure Laterality Date  . Femoral bypass  2003   Family History  Problem Relation Age of Onset  . Cancer Mother   . Cancer Father   . Cancer Sister    Social History   Social History  . Marital Status: Divorced    Spouse Name: N/A  . Number of Children: N/A  . Years of Education: N/A   Occupational History  . retired Architect    Social History Main Topics  . Smoking status: Former Smoker -- 0.50 packs/day for 50 years    Types: Cigarettes    Quit date: 06/30/2014  . Smokeless tobacco: Not on file     Comment: He has a 50-pack-year hx of tobacco abuse. Currently, smoking about half a pack a day.  . Alcohol Use: Yes     Comment: Previously drank heavily, and now has a drink every other day or so.  . Drug Use: No  . Sexual Activity: Not on file   Other Topics Concern  . Not on file   Social History Narrative    Review of Systems: Negative except per HPI  Physical Exam: Blood pressure 176/82, pulse 97, temperature 98 F (36.7 C), temperature source Oral, resp. rate 20, SpO2 96 %. General: Lying in bed, NAD HEENT: Hearing lateralizes to the left. Both tympanic membranes visualized with minimal wax. EOMI, PERRL. Sclerae anicteric. Moist mucous membranes. No cervical adenopathy. Cardiovascular: RRR. No m/r/g Pulmonary: Clear to ausculation bilaterally. Good air movement Abdomen: Soft, NT/ND. Normal bowel sounds. No suprapubic tenderness. Rectal: Dark brown stool  on exam, no frank blood Ext: 1+ pitting edema. 2+ DP palpated bilaterally Skin: Warm and dry Neurological: AAOx3  Lab results: Basic Metabolic Panel:  Recent Labs  11/28/15 1150  NA 138  K 4.6  CL 102  CO2 30  GLUCOSE 293*  BUN 29*  CREATININE 2.15*  CALCIUM 9.3   Liver Function Tests:  Recent Labs  11/28/15 1150  AST 20  ALT 10*  ALKPHOS 49  BILITOT 0.5  PROT 7.2  ALBUMIN 2.8*   CBC:  Recent Labs  11/28/15 1150  WBC 5.2  HGB 9.6*  HCT 29.4*  MCV 95.5  PLT 218   CBG:  Recent Labs  11/28/15 1632  GLUCAP 80   Coagulation:  Recent Labs  11/28/15 1150  LABPROT 16.9*  INR 1.36      Imaging results:  Dg Chest 2 View  11/28/2015  CLINICAL DATA:  Shortness of Breath EXAM: CHEST  2 VIEW COMPARISON:  October 20, 2014 FINDINGS: There is fibrotic change in the bases. There is a minimal pleural effusion on the right. The lungs elsewhere are clear. Heart is borderline enlarged with pulmonary vascularity within normal limits. No adenopathy. There is atherosclerotic calcification in aorta. No bone lesions. IMPRESSION: No edema or consolidation. Minimal right pleural effusion with fibrotic type change  in the bases. Borderline cardiomegaly. There may be a degree of mild underlying congestive heart failure given borderline cardiac enlargement and small right effusion. Electronically Signed   By: Lowella Grip III M.D.   On: 11/28/2015 13:17    Assessment & Plan by Problem:  Anemia in setting of GI bleed: Source could be due to alcohol-induced gastric ulcer or bleeding polyp from lower GI tract in setting of anticoagulation. His last hemoglobin on file was a year ago, so it is unclear how far he is from his baseline. It appears that polyps were previously found on his colonoscopy from several years ago.  - GI consult for possible EGD/colonoscopy - Hold Eliquis - Anemia panel, CBC in AM  Complicated UTI: Continues to complain of dysuria. He has completed three  doses of ciprofloxacin. His total course should be five days - Continue ciprofloxacin 500 mg on 12/3  Atrial Fibrillation: Rate is currently regular. Was cause behind CVA last year.  - Rate controlled with metoprolol 50 mg BID - Hold Eliquis  AKI vs. Worsening Chronic Kidney Disease: Last creatinine was last year with a baseline of 1.5-1.6. Creatinine on admission was 2.15. He is not dry on exam and there are no new medications that are considered nephrotoxic. It is quite possible this is his new baseline, and therefore, will not make any adjustments to his lisinopril and furosemide. No fluids at this time.   Lung malignancy: Subtype and staging unknown. He reports it has responded to radiation, which he completed last week. Malignancy and radiation could be a cause of anemia and perhaps the shortness of breat on exertion.  CHF (EF 50-55%): Edema on exam, but it does not seem as though his current dyspnea on exertion is related.  T2DM with neuropathy: Glucose of 293 on admission. On 40U lantus daily at home with 5-10U Humalin R BID per sliding scale. - 30U lantus daily with Moderate SSI - Gabapentin 600 mg AM, 300 mg PM  PVD: Continue Cilostazol daily  BPH: Continue finasteride and tamulosin  Alcohol Use Disorder: CIWA protocol with vitamins and thiamin  Bipolar Disorder: Continue depakote  GERD: Continue protonix 20 daily.  DVT Prophylaxis: SCDs Code Status: Full Diet: Heart Healthy/Carb Mod  Dispo: Disposition is deferred at this time, awaiting improvement of current medical problems. Anticipated discharge in approximately 2-3 day(s).   The patient does have a current PCP Jilda Panda, MD) and does not need an [facility] hospital follow-up appointment after discharge.  The patient does not have transportation limitations that hinder transportation to clinic appointments.  Signed: Liberty Handy, MD 11/28/2015, 4:56 PM

## 2015-11-28 NOTE — ED Notes (Signed)
Pt arrives from home via [facility] c/o bloody stools. Pt was evaluated by the [facility] recently and was told that he had some abnormal labs and if he had any dark stool to come to the ER immediately. Pt denies pain.

## 2015-11-28 NOTE — ED Provider Notes (Signed)
CSN: [id number]     Arrival date & time 11/28/15  1124 History   First MD Initiated Contact with Patient 11/28/15 1127     Chief Complaint  Patient presents with  . Rectal Bleeding     (Consider location/radiation/quality/duration/timing/severity/associated sxs/prior Treatment) HPI  79 year old male with a history of atrial fibrillation on Eliquis presents with a dark black stool this morning. Patient went to the [place] 2 days ago and was told his blood counts were low or high, he can remember. However they were concerned about "internal bleeding". They stated he needed to come in to the hospital if he noticed any black or bloody stools. He is not sure if he has been having these over last couple days but specifically look today and noticed it to be black. Denies abdominal pain. States he had radiation to his chest couple weeks ago and has had some shortness of breath since then but it has signal daily worsened over the last couple days. Denies chest pain or dizziness.  Past Medical History  Diagnosis Date  . Hyperlipidemia   . HTN (hypertension)     echo 2/11: EF 50-55%, diast dyfxn, severe LVH, inf HK, LAE  . CAD (coronary artery disease)     a.  NSTEMI treated with PCI Feb 2011 with a DES.([facility]);   b. cath 2/11: D1 stents x 2 ok, AV groove CFX occluded with dist AV CFX filled by L-L collats, RCA occluded, OM2 90-95% (treated with PCI)  . MI (myocardial infarction) ([place])   . PVD (peripheral vascular disease) ([place])   . DM (diabetes mellitus) ([place])   . GERD (gastroesophageal reflux disease)   . Bipolar disorder ([place])   . Prostate cancer [facility])     status post radiation therapy in 2003  . Pulmonary edema   . Atrial fibrillation ([place])   . Respiratory difficulty 06/24/2014    intubated   . Hypertensive emergency 06/24/2014  . CKD (chronic kidney disease), stage III    Past Surgical History  Procedure Laterality Date  . Femoral bypass  2003   Family History  Problem Relation Age of  Onset  . Cancer Mother   . Cancer Father   . Cancer Sister    Social History  Substance Use Topics  . Smoking status: Former Smoker -- 0.50 packs/day for 50 years    Types: Cigarettes    Quit date: 06/30/2014  . Smokeless tobacco: None     Comment: He has a 50-pack-year hx of tobacco abuse. Currently, smoking about half a pack a day.  . Alcohol Use: Yes     Comment: Previously drank heavily, and now has a drink every other day or so.    Review of Systems  Respiratory: Positive for shortness of breath.   Cardiovascular: Negative for chest pain.  Gastrointestinal: Negative for nausea, vomiting, abdominal pain and diarrhea.  All other systems reviewed and are negative.     Allergies  Penicillins  Home Medications   Prior to Admission medications   Medication Sig Start Date End Date Taking? Authorizing Provider  albuterol (PROVENTIL HFA;VENTOLIN HFA) 108 (90 BASE) MCG/ACT inhaler Inhale 2 puffs into the lungs every 6 (six) hours as needed for shortness of breath.    Historical Provider, MD  apixaban (ELIQUIS) 5 MG TABS tablet Take 1 tablet (5 mg total) by mouth 2 (two) times daily. 08/28/14   Thurnell Lose, MD  atorvastatin (LIPITOR) 40 MG tablet Take 1 tablet (40 mg total) by mouth daily. 08/01/14  Sherren Mocha, MD  cilostazol (PLETAL) 50 MG tablet Take 50 mg by mouth 2 (two) times daily.    Historical Provider, MD  divalproex (DEPAKOTE) 250 MG EC tablet Take 500 mg by mouth 2 (two) times daily.     Historical Provider, MD  finasteride (PROSCAR) 5 MG tablet Take 5 mg by mouth daily.      Historical Provider, MD  furosemide (LASIX) 40 MG tablet Take '80mg'$  in the morning and '40mg'$  in the evening    Historical Provider, MD  gabapentin (NEURONTIN) 300 MG capsule Take 300-600 mg by mouth 2 (two) times daily. '600mg'$  in the morning and '300mg'$  in the evening    Historical Provider, MD  hydrALAZINE (APRESOLINE) 25 MG tablet Take 25 mg by mouth 2 (two) times daily.    Historical Provider, MD   insulin glargine (LANTUS) 100 UNIT/ML injection Inject 40 Units into the skin daily. 07/20/14   Geradine Girt, DO  insulin regular (HUMULIN R,NOVOLIN R) 100 units/mL injection Inject 8 Units into the skin 2 (two) times daily before a meal. Per sliding scale    Historical Provider, MD  metoprolol (LOPRESSOR) 50 MG tablet Take 1 tablet (50 mg total) by mouth 2 (two) times daily. 08/14/14   Sherren Mocha, MD  pantoprazole (PROTONIX) 20 MG tablet Take 20 mg by mouth daily.    Historical Provider, MD  sildenafil (VIAGRA) 100 MG tablet Take 100 mg by mouth daily as needed for erectile dysfunction.    Historical Provider, MD  Tamsulosin HCl (FLOMAX) 0.4 MG CAPS Take 0.4 mg by mouth daily.      Historical Provider, MD  travoprost, benzalkonium, (TRAVATAN) 0.004 % ophthalmic solution Place 1 drop into both eyes at bedtime.     Historical Provider, MD   BP 132/64 mmHg  Pulse 93  Temp(Src) 98.9 F (37.2 C) (Oral)  Resp 25  SpO2 99% Physical Exam  Constitutional: He is oriented to person, place, and time. He appears well-developed and well-nourished.  HENT:  Head: Normocephalic and atraumatic.  Right Ear: External ear normal.  Left Ear: External ear normal.  Nose: Nose normal.  Eyes: Right eye exhibits no discharge. Left eye exhibits no discharge.  Neck: Neck supple.  Cardiovascular: Normal rate, regular rhythm, normal heart sounds and intact distal pulses.   Pulmonary/Chest: Effort normal and breath sounds normal.  Abdominal: Soft. There is no tenderness.  Genitourinary: Rectal exam shows no external hemorrhoid, no internal hemorrhoid, no fissure and no tenderness. Guaiac positive stool.  Dark brown stool on exam  Musculoskeletal: He exhibits no edema.  Neurological: He is alert and oriented to person, place, and time.  Skin: Skin is warm and dry.  Nursing note and vitals reviewed.   ED Course  Procedures (including critical care time) Labs Review Labs Reviewed  COMPREHENSIVE METABOLIC  PANEL - Abnormal; Notable for the following:    Glucose, Bld 293 (*)    BUN 29 (*)    Creatinine, Ser 2.15 (*)    Albumin 2.8 (*)    ALT 10 (*)    GFR calc non Af Amer 28 (*)    GFR calc Af Amer 32 (*)    All other components within normal limits  CBC - Abnormal; Notable for the following:    RBC 3.08 (*)    Hemoglobin 9.6 (*)    HCT 29.4 (*)    RDW 16.3 (*)    All other components within normal limits  PROTIME-INR - Abnormal; Notable for the following:  Prothrombin Time 16.9 (*)    All other components within normal limits  POC OCCULT BLOOD, ED - Abnormal; Notable for the following:    Fecal Occult Bld POSITIVE (*)    All other components within normal limits  TYPE AND SCREEN  ABO/RH    Imaging Review Dg Chest 2 View  11/28/2015  CLINICAL DATA:  Shortness of Breath EXAM: CHEST  2 VIEW COMPARISON:  October 20, 2014 FINDINGS: There is fibrotic change in the bases. There is a minimal pleural effusion on the right. The lungs elsewhere are clear. Heart is borderline enlarged with pulmonary vascularity within normal limits. No adenopathy. There is atherosclerotic calcification in aorta. No bone lesions. IMPRESSION: No edema or consolidation. Minimal right pleural effusion with fibrotic type change in the bases. Borderline cardiomegaly. There may be a degree of mild underlying congestive heart failure given borderline cardiac enlargement and small right effusion. Electronically Signed   By: Lowella Grip III M.D.   On: 11/28/2015 13:17   I have personally reviewed and evaluated these images and lab results as part of my medical decision-making.   EKG Interpretation None      MDM   Final diagnoses:  Anemia, unspecified anemia type  Melena    Patient is hemodynamically stable. Concern for an active GI bleed with his report of melena, mild decrease in hemoglobin and being on a blood thinner. He has worsening shortness of breath, difficult to tell this is radiation related  versus anemia. Plan to admit to the hospital given that he is actively bleeding with likely symptomatic anemia and on a blood thinner. Stable for the floor.    Sherwood Gambler, MD 11/28/15 [phone]

## 2015-11-28 NOTE — ED Notes (Signed)
Per EMS pts CBG was 406. Pt states he took 40 units of Lantus and 17 units of Novolin PTA.

## 2015-11-29 ENCOUNTER — Inpatient Hospital Stay (HOSPITAL_COMMUNITY): Payer: Medicare Other | Admitting: Anesthesiology

## 2015-11-29 ENCOUNTER — Encounter (HOSPITAL_COMMUNITY)
Admission: EM | Disposition: A | Discharge status: Home / Self Care | Payer: Self-pay | Source: Home / Self Care | Attending: Internal Medicine

## 2015-11-29 ENCOUNTER — Encounter (HOSPITAL_COMMUNITY): Payer: Self-pay

## 2015-11-29 DIAGNOSIS — K922 Gastrointestinal hemorrhage, unspecified: Secondary | ICD-10-CM

## 2015-11-29 DIAGNOSIS — D649 Anemia, unspecified: Secondary | ICD-10-CM

## 2015-11-29 HISTORY — PX: ESOPHAGOGASTRODUODENOSCOPY: SHX5428

## 2015-11-29 LAB — COMPREHENSIVE METABOLIC PANEL
ALK PHOS: 47 U/L (ref 38–126)
ALT: 9 U/L — ABNORMAL LOW (ref 17–63)
AST: 17 U/L (ref 15–41)
Albumin: 2.5 g/dL — ABNORMAL LOW (ref 3.5–5.0)
Anion gap: 9 (ref 5–15)
BILIRUBIN TOTAL: 0.7 mg/dL (ref 0.3–1.2)
BUN: 30 mg/dL — ABNORMAL HIGH (ref 6–20)
CO2: 29 mmol/L (ref 22–32)
CREATININE: 2.13 mg/dL — AB (ref 0.61–1.24)
Calcium: 8.9 mg/dL (ref 8.9–10.3)
Chloride: 97 mmol/L — ABNORMAL LOW (ref 101–111)
GFR calc non Af Amer: 28 mL/min — ABNORMAL LOW (ref >60)
GFR, EST AFRICAN AMERICAN: 33 mL/min — AB (ref >60)
GLUCOSE: 362 mg/dL — AB (ref 65–99)
Potassium: 4.6 mmol/L (ref 3.5–5.1)
Sodium: 135 mmol/L (ref 135–145)
Total Protein: 6.9 g/dL (ref 6.5–8.1)

## 2015-11-29 LAB — RETICULOCYTES
RBC.: 2.99 MIL/uL — ABNORMAL LOW (ref 4.22–5.81)
RETIC COUNT ABSOLUTE: 80.7 10*3/uL (ref 19.0–186.0)
Retic Ct Pct: 2.7 % (ref 0.4–3.1)

## 2015-11-29 LAB — GLUCOSE, CAPILLARY
GLUCOSE-CAPILLARY: 210 mg/dL — AB (ref 65–99)
Glucose-Capillary: 294 mg/dL — ABNORMAL HIGH (ref 65–99)
Glucose-Capillary: 73 mg/dL (ref 65–99)

## 2015-11-29 LAB — IRON AND TIBC
Iron: 47 ug/dL (ref 45–182)
Saturation Ratios: 18 % (ref 17.9–39.5)
TIBC: 266 ug/dL (ref 250–450)
UIBC: 219 ug/dL

## 2015-11-29 LAB — CBC
HCT: 28.8 % — ABNORMAL LOW (ref 39.0–52.0)
Hemoglobin: 9 g/dL — ABNORMAL LOW (ref 13.0–17.0)
MCH: 30.1 pg (ref 26.0–34.0)
MCHC: 31.3 g/dL (ref 30.0–36.0)
MCV: 96.3 fL (ref 78.0–100.0)
PLATELETS: 205 10*3/uL (ref 150–400)
RBC: 2.99 MIL/uL — ABNORMAL LOW (ref 4.22–5.81)
RDW: 16.4 % — ABNORMAL HIGH (ref 11.5–15.5)
WBC: 5.1 10*3/uL (ref 4.0–10.5)

## 2015-11-29 LAB — HEMOGLOBIN A1C
Hgb A1c MFr Bld: 10.4 % — ABNORMAL HIGH (ref 4.8–5.6)
Mean Plasma Glucose: 252 mg/dL

## 2015-11-29 LAB — FERRITIN: FERRITIN: 109 ng/mL (ref 24–336)

## 2015-11-29 SURGERY — EGD (ESOPHAGOGASTRODUODENOSCOPY)
Anesthesia: Monitor Anesthesia Care

## 2015-11-29 MED ORDER — HYDROCODONE-ACETAMINOPHEN 5-325 MG PO TABS
1.0000 | ORAL_TABLET | Freq: Once | ORAL | Status: AC
  Administered 2015-11-29: 1 via ORAL
  Filled 2015-11-29: qty 1

## 2015-11-29 MED ORDER — SODIUM CHLORIDE 0.9 % IV SOLN: INTRAVENOUS | Status: DC

## 2015-11-29 MED ORDER — DICLOFENAC SODIUM 1 % TD GEL: 2.0000 g | Freq: Four times a day (QID) | TRANSDERMAL | Status: DC

## 2015-11-29 MED ORDER — PANTOPRAZOLE SODIUM 40 MG PO TBEC: 40.0000 mg | DELAYED_RELEASE_TABLET | Freq: Every day | ORAL | Status: DC

## 2015-11-29 MED ORDER — DICLOFENAC SODIUM 1 % TD GEL
2.0000 g | Freq: Four times a day (QID) | TRANSDERMAL | Status: DC
  Administered 2015-11-29: 2 g via TOPICAL
  Filled 2015-11-29: qty 100

## 2015-11-29 MED ORDER — PROPOFOL 500 MG/50ML IV EMUL
INTRAVENOUS | Status: DC | PRN
  Administered 2015-11-29: 50 ug/kg/min via INTRAVENOUS

## 2015-11-29 MED ORDER — INSULIN GLARGINE 100 UNIT/ML ~~LOC~~ SOLN
20.0000 [IU] | Freq: Every day | SUBCUTANEOUS | Status: DC
  Filled 2015-11-29: qty 0.2

## 2015-11-29 MED ORDER — PANTOPRAZOLE SODIUM 40 MG PO TBEC: 40.0000 mg | DELAYED_RELEASE_TABLET | Freq: Every day | ORAL | Status: AC

## 2015-11-29 MED ORDER — NYSTATIN 100000 UNIT/ML MT SUSP: 5.0000 mL | Freq: Four times a day (QID) | OROMUCOSAL | Status: DC

## 2015-11-29 MED ORDER — PROMETHAZINE HCL 25 MG/ML IJ SOLN: 6.2500 mg | INTRAMUSCULAR | Status: DC | PRN

## 2015-11-29 MED ORDER — BUTAMBEN-TETRACAINE-BENZOCAINE 2-2-14 % EX AERO
INHALATION_SPRAY | CUTANEOUS | Status: DC | PRN
  Administered 2015-11-29: 2 via TOPICAL

## 2015-11-29 NOTE — Op Note (Signed)
[facility] [place], [id number]   ENDOSCOPY PROCEDURE REPORT  PATIENT: [name], [name]  MR#: [id number] BIRTHDATE: 01-14-1936 , 79  yrs. old GENDER: male ENDOSCOPIST: Acquanetta Sit, MD REFERRED BY: PROCEDURE DATE:  12-28-2015 PROCEDURE:  EGD ASA CLASS:     2 INDICATIONS:  melena MEDICATIONS: propofol per anesthesia TOPICAL ANESTHETIC: Cetacaine spray  DESCRIPTION OF PROCEDURE: After the risks benefits and alternatives of the procedure were thoroughly explained, informed consent was obtained.  The Pentax Gastroscope [id number] endoscope was introduced through the mouth and advanced to the second portion of the duodenum , Without limitations.  The instrument was slowly withdrawn as the mucosa was fully examined. Estimated blood loss is zero unless otherwise noted in this procedure report.  Findings:  Esophagus: In the proximal esophagus there were some scattered whitish plaques seen in image 7. This looks consistent with an early Candida esophagitis.  Stomach: There were a few prepyloric erosions. No evidence of active bleeding.  Duodenum: Normal    The scope was then withdrawn from the patient and the procedure completed.  COMPLICATIONS: There were no immediate complications.  ENDOSCOPIC IMPRESSION:#1. Early candida esophagitis. #2. A few scattered erosions in the prepyloric antrum   RECOMMENDATIONS:I suspect the patient's melena was related to the antral erosions oozing in the face of his being on anticoagulation.  I would recommend PPI therapy. I would recommend beginning nystatin oral suspension 4 times a day for the next 10 days for the Candida esophagitis. We can advance his diet. I think he can go home. Follow-up as needed. I think he can continue on his anticoagulation but should be on a PPI for gastric protection.   REPEAT EXAM:  eSignedAcquanetta Sit, MD 28-Dec-2015 3:18 PM    CC:  CPT CODES: ICD CODES:  The  ICD and CPT codes recommended by this software are interpretations from the data that the clinical staff has captured with the software.  The verification of the translation of this report to the ICD and CPT codes and modifiers is the sole responsibility of the health care institution and practicing physician where this report was generated.  [employer] will not be held responsible for the validity of the ICD and CPT codes included on this report.  AMA assumes no liability for data contained or not contained herein. CPT is a Designer, television/film set of the [employer].  PATIENT NAME:  [name], [name] MR#: [id number]

## 2015-11-29 NOTE — Progress Notes (Signed)
[name] to be D/C'd  per MD order. Discussed with the patient and all questions fully answered.  VSS, Skin clean, dry and intact without evidence of skin break down, no evidence of skin tears noted.  IV catheter discontinued intact. Site without signs and symptoms of complications. Dressing and pressure applied.  An After Visit Summary was printed and given to the patient. Patient received prescription.  D/c education completed with patient/family including follow up instructions, medication list, d/c activities limitations if indicated, with other d/c instructions as indicated by MD - patient able to verbalize understanding, all questions fully answered.   Patient instructed to return to ED, call 911, or call MD for any changes in condition.   Patient to be escorted via [place], and D/C home via private auto.

## 2015-11-29 NOTE — Anesthesia Procedure Notes (Signed)
Procedure Name: MAC Date/Time: 11/29/2015 2:49 PM Performed by: Lowella Dell Pre-anesthesia Checklist: Patient identified, Emergency Drugs available, Suction available, Patient being monitored and Timeout performed Patient Re-evaluated:Patient Re-evaluated prior to inductionOxygen Delivery Method: Nasal cannula Intubation Type: IV induction Dental Injury: Teeth and Oropharynx as per pre-operative assessment

## 2015-11-29 NOTE — Discharge Summary (Signed)
Name: [name] MRN: [id number] DOB: [birth year] 79 y.o. PCP: Jilda Panda, MD  Date of Admission: 11/28/2015 11:24 AM Date of Discharge: 11/29/2015 Attending Physician: Larey Dresser, MD Discharge Diagnosis: 1. GI Bleed  2. Anemia  Discharge Medications:   Medication List    TAKE these medications        albuterol 108 (90 BASE) MCG/ACT inhaler  Commonly known as:  PROVENTIL HFA;VENTOLIN HFA  Inhale 2 puffs into the lungs every 6 (six) hours as needed for shortness of breath.     apixaban 5 MG Tabs tablet  Commonly known as:  ELIQUIS  Take 1 tablet (5 mg total) by mouth 2 (two) times daily.     atorvastatin 40 MG tablet  Commonly known as:  LIPITOR  Take 1 tablet (40 mg total) by mouth daily.     cholecalciferol 1000 UNITS tablet  Commonly known as:  VITAMIN D  Take 1,000 Units by mouth daily.     cilostazol 50 MG tablet  Commonly known as:  PLETAL  Take 50 mg by mouth 2 (two) times daily.     ciprofloxacin 500 MG tablet  Commonly known as:  CIPRO  Take 500 mg by mouth 2 (two) times daily.     diclofenac sodium 1 % Gel  Commonly known as:  VOLTAREN  Apply 2 g topically 4 (four) times daily.     divalproex 250 MG DR tablet  Commonly known as:  DEPAKOTE  Take 250 mg by mouth 2 (two) times daily.     finasteride 5 MG tablet  Commonly known as:  PROSCAR  Take 5 mg by mouth daily.     furosemide 40 MG tablet  Commonly known as:  LASIX  Take 80 mg by mouth daily.     gabapentin 300 MG capsule  Commonly known as:  NEURONTIN  Take 300-600 mg by mouth 2 (two) times daily. '600mg'$  in the morning and '300mg'$  in the evening     hydrALAZINE 25 MG tablet  Commonly known as:  APRESOLINE  Take 25 mg by mouth 2 (two) times daily.     insulin glargine 100 UNIT/ML injection  Commonly known as:  LANTUS  Inject 40 Units into the skin daily.     insulin regular 100 units/mL injection  Commonly known as:  NOVOLIN R,HUMULIN R  Inject 5-10 Units into the skin 2  (two) times daily before a meal. Per sliding scale     latanoprost 0.005 % ophthalmic solution  Commonly known as:  XALATAN  Place 1 drop into both eyes at bedtime.     lisinopril 20 MG tablet  Commonly known as:  PRINIVIL,ZESTRIL  Take 10 mg by mouth daily.     metoprolol 50 MG tablet  Commonly known as:  LOPRESSOR  Take 1 tablet (50 mg total) by mouth 2 (two) times daily.     nystatin 100000 UNIT/ML suspension  Commonly known as:  MYCOSTATIN  Take 5 mLs (500,000 Units total) by mouth 4 (four) times daily.     pantoprazole 40 MG tablet  Commonly known as:  PROTONIX  Take 1 tablet (40 mg total) by mouth daily.     simethicone 80 MG chewable tablet  Commonly known as:  MYLICON  Chew 80 mg by mouth every 6 (six) hours as needed for flatulence.     tamsulosin 0.4 MG Caps capsule  Commonly known as:  FLOMAX  Take 0.4 mg by mouth daily.     travoprost (benzalkonium) 0.004 %  ophthalmic solution  Commonly known as:  TRAVATAN  Place 1 drop into both eyes at bedtime.     VIAGRA 100 MG tablet  Generic drug:  sildenafil  Take 100 mg by mouth daily as needed for erectile dysfunction.     vitamin B-12 1000 MCG tablet  Commonly known as:  CYANOCOBALAMIN  Take 1,000 mcg by mouth daily.        Disposition and follow-up:   JohnDelontae H [name] was discharged from [facility] in good condition.  At the hospital follow up visit please address:  1.  Recheck CBC to ensure hemoglobin is stable.   2.  Labs / imaging needed at time of follow-up: CBC  3.  Pending labs/ test needing follow-up: None  Follow-up Appointments: Please call your primary care doctor, Dr. Jilda Panda, at the [facility] for a follow-up appointment in 2 weeks.   Discharge Instructions:     Discharge Instructions    Diet - low sodium heart healthy    Complete by:  As directed      Increase activity slowly    Complete by:  As directed           FOLLOW UP WITH YOUR PRIMARY CARE DOCTOR AT [place] 2 WEEKS.  CONTINUE TAKING ALL OF YOUR MEDICATIONS AS PREVIOUSLY PRESCRIBED:  NOW TAKE PANTOPRAZOLE (PROTONIX) 40 MG PER DAY.  CONTINUE TAKING YOUR ELIQUIS TWICE A DAY  TAKE NYSTATIN LIQUID 5 mL 4 TIMES PER DAY FOR 10 DAYS.   Consultations: Treatment Team:  Wonda Horner, MD  Procedures Performed:  Dg Chest 2 View  11/28/2015  CLINICAL DATA:  Shortness of Breath EXAM: CHEST  2 VIEW COMPARISON:  October 20, 2014 FINDINGS: There is fibrotic change in the bases. There is a minimal pleural effusion on the right. The lungs elsewhere are clear. Heart is borderline enlarged with pulmonary vascularity within normal limits. No adenopathy. There is atherosclerotic calcification in aorta. No bone lesions. IMPRESSION: No edema or consolidation. Minimal right pleural effusion with fibrotic type change in the bases. Borderline cardiomegaly. There may be a degree of mild underlying congestive heart failure given borderline cardiac enlargement and small right effusion. Electronically Signed   By: Lowella Grip III M.D.   On: 11/28/2015 13:17    Admission HPI:   [name] is a 79 year old man with a past medical history of atrial fibrillation (CHA2DS2-VASc 8 on Eliquis), previous CVA with minimal residual deficits, CHF (EF 50-55% 05/2014), CKD Stage 3, Bipolar disorder, HTN, PVD, and unknown lung cancer (just completed radiation course at [place]), who presents with dark, tarry stool. He had gone to his PCP at the [place], who noticed that his blood counts were low, and he was told to go to the hospital if his stools were dark. Yesterday, he thought to check, and noticed his stools were dark. He endorses some dyspnea on exertion (different from his previous CHF exacerbations), but he denies any light-headedness, chest pain, hematochezia or abdominal pain. He reports taking ibuprofen at least once a month, but no more than two pills in a day. He drinks 1/2 gallon of bourbon a week (converts to 6 shots/day), and he  denies any withdrawal symptoms. Last drink was yesterday. He had a colonoscopy "3 or 4 years ago" at the [place], saying that "they found something" and was told to follow-up in five years. He reports some unintended weight loss 6 months ago, when he went from 230 lbs to 190 lbs  which was associated with decreased appetite. Notably, this preceded the identification of his lung cancer, and he has had an improved appetite since finishing his radiation course last week - he has a chronic dry cough without hemoptysis. He smokes 1ppd with a 50 pack-year history - he has tried patches before but they irritate his skin. 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 He complains of increased urinary frequency and dysuria, for which he has taken 3 doses of ciprofloxacin thus far.   In the ED, he was noted have a Hgb of 9.6 with a positive FOBT, Hgb was 12.1 last year. He was hemodynamically stable.   Hospital Course by problem list:  GI bleed: Mr. Statler had no episodes of hematochezia and did not have any chest pain, shortness of breath, and light-headedness during the hospitalization. An EGD was performed which demonstrated erosions in the antrum of the stomach, and Dr. Penelope Coop, gastroenterologist, attributed his melena to oozing in setting of Eliquis therapy. Dr. Penelope Coop recommended a PPI and continuing Eliquis. Eliquis 5 mg BID was resumed given that his renal dysfunction was stably at Stage 3B, and protonix 40 mg daily was initiated. His hemoglobin remained stable at 9 or above during the hospitalization. Chronic alcohol use may be a contributor.  Anemia of Chronic Disease: An anemia panel demonstrated a TIBC on the low side of normal (266), a normal ferritin, and a normal reticulocyte count. Given that his WBC is also decreased from last year, in the 5s versus the 8s in 2015, this could represent bone marrow suppression in the setting malignancy and radiation therapy.  Early Candidal Esophagitis:  Noted on EGD on 12/2. HIV negative. Could have arose from immunosuppression in setting malignancy and recent radiation therapy. GI recommended nystatin oral suspension 4 times a day for 10 days for Candida esophagitis, which he was discharged on.  Complicated UTI: Continued to complain of dysuria. He has completed three doses of ciprofloxacin. His total course should be five days. Discharged ciprofloxacin 500 mg until 12/3.  Atrial Fibrillation: On Eliquis given CHADS-VASc score of 8 and history of CVA. Discharged on home Metoprolol 50 mg BID and Eliquis.  Chronic Kidney Disease Stage 3B: Creatinine 1.5-1.6 in 2015, and was stably in the 2.1s during admission. No signs or symptoms of hypovolemia and likely represents progression of CKD.  Lung Malignancy: Subtype and staging unknown. Per patient report, completed last round of radiation last week. Could be related to esophageal candidiasis and anemia.  Chronic Systolic CHF (EF 07-62%): During hospitalization, he had +LEE bilaterally, but no evidence of crackles or shortness of breath. He was continued on home furosemide dose.  T2DM with Neuropathy: Placed on 30U lantus daily with Moderate SSI, reduced from his home dose. Discharged on home dose of 40U lantus daily at home with 5-10U Humalin R BID per sliding scale. BGs stable during admission. Continued Gabapentin 600 mg AM, 300 mg PM  Alcohol Use Disorder: CIWA protocol with vitamins and thiamine during admission.   Bipolar Disorder: Stable. Continue depakote.  GERD: Continued Protonix 40 daily.  Discharge Vitals:   BP 96/54 mmHg  Pulse 85  Temp(Src) 97.7 F (36.5 C) (Oral)  Resp 20  Ht 6' (1.829 m)  Wt 198 lb (89.812 kg)  BMI 26.85 kg/m2  SpO2 93%  Discharge Labs:  Results for orders placed or performed during the hospital encounter of 11/28/15 (from the past 24 hour(s))  Glucose, capillary     Status: Abnormal   Collection  Time: 11/28/15  8:58 PM  Result Value Ref Range    Glucose-Capillary 174 (H) 65 - 99 mg/dL  Comprehensive metabolic panel     Status: Abnormal   Collection Time: 11/29/15  6:53 AM  Result Value Ref Range   Sodium 135 135 - 145 mmol/L   Potassium 4.6 3.5 - 5.1 mmol/L   Chloride 97 (L) 101 - 111 mmol/L   CO2 29 22 - 32 mmol/L   Glucose, Bld 362 (H) 65 - 99 mg/dL   BUN 30 (H) 6 - 20 mg/dL   Creatinine, Ser 2.13 (H) 0.61 - 1.24 mg/dL   Calcium 8.9 8.9 - 10.3 mg/dL   Total Protein 6.9 6.5 - 8.1 g/dL   Albumin 2.5 (L) 3.5 - 5.0 g/dL   AST 17 15 - 41 U/L   ALT 9 (L) 17 - 63 U/L   Alkaline Phosphatase 47 38 - 126 U/L   Total Bilirubin 0.7 0.3 - 1.2 mg/dL   GFR calc non Af Amer 28 (L) >60 mL/min   GFR calc Af Amer 33 (L) >60 mL/min   Anion gap 9 5 - 15  CBC     Status: Abnormal   Collection Time: 11/29/15  6:53 AM  Result Value Ref Range   WBC 5.1 4.0 - 10.5 K/uL   RBC 2.99 (L) 4.22 - 5.81 MIL/uL   Hemoglobin 9.0 (L) 13.0 - 17.0 g/dL   HCT 28.8 (L) 39.0 - 52.0 %   MCV 96.3 78.0 - 100.0 fL   MCH 30.1 26.0 - 34.0 pg   MCHC 31.3 30.0 - 36.0 g/dL   RDW 16.4 (H) 11.5 - 15.5 %   Platelets 205 150 - 400 K/uL  Ferritin     Status: None   Collection Time: 11/29/15  6:53 AM  Result Value Ref Range   Ferritin 109 24 - 336 ng/mL  Iron and TIBC     Status: None   Collection Time: 11/29/15  6:53 AM  Result Value Ref Range   Iron 47 45 - 182 ug/dL   TIBC 266 250 - 450 ug/dL   Saturation Ratios 18 17.9 - 39.5 %   UIBC 219 ug/dL  Reticulocytes     Status: Abnormal   Collection Time: 11/29/15  6:53 AM  Result Value Ref Range   Retic Ct Pct 2.7 0.4 - 3.1 %   RBC. 2.99 (L) 4.22 - 5.81 MIL/uL   Retic Count, Manual 80.7 19.0 - 186.0 K/uL  Glucose, capillary     Status: Abnormal   Collection Time: 11/29/15  8:03 AM  Result Value Ref Range   Glucose-Capillary 294 (H) 65 - 99 mg/dL  Glucose, capillary     Status: Abnormal   Collection Time: 11/29/15 11:31 AM  Result Value Ref Range   Glucose-Capillary 210 (H) 65 - 99 mg/dL   Comment 1 Notify  RN    Comment 2 Document in Chart   Glucose, capillary     Status: None   Collection Time: 11/29/15  4:37 PM  Result Value Ref Range   Glucose-Capillary 73 65 - 99 mg/dL   Comment 1 Notify RN    Comment 2 Document in Chart     Signed: Liberty Handy, MD 11/29/2015, 6:10 PM

## 2015-11-29 NOTE — Progress Notes (Signed)
Nutrition Brief Note  Patient identified on the Malnutrition Screening Tool (MST) Report  Wt Readings from Last 15 Encounters:  11/28/15 198 lb (89.812 kg)  11/20/14 216 lb (97.977 kg)  10/22/14 212 lb 4.9 oz (96.3 kg)  08/29/14 218 lb (98.884 kg)  08/27/14 215 lb (97.523 kg)  08/14/14 224 lb 3.2 oz (101.696 kg)  07/20/14 221 lb 1.6 oz (100.29 kg)  07/03/14 228 lb 6.4 oz (103.602 kg)  06/29/14 230 lb 13.2 oz (104.7 kg)  09/22/13 226 lb (102.513 kg)  07/12/13 220 lb (99.791 kg)  09/16/12 243 lb 6.4 oz (110.406 kg)  02/02/12 243 lb (110.224 kg)  08/04/11 241 lb (109.317 kg)  04/28/11 233 lb (105.688 kg)   The patient is a 79 year old male who a couple of days ago was at the [facility] in [facility]. He states that they checked his blood and his blood count was low. He was told that if he started to have black stools or see any signs of bleeding to go to the hospital. They mentioned do an an EGD. He had not seen any blood or black stools until this morning when he started to experience black tarry stools. He denies abdominal pain or vomiting or hematemesis. He has never had a peptic ulcer. He states he had a colonoscopy about 4 years ago at the [place].  Pt admitted with anemia in the setting of GIB.   Pt was sleeping soundly at time of visit. Spoke with RN, who requests that pt not be awoken, due to pt being irritable. She reports he is scheduled for an EGD at 1415 and is currently NPO for procedure. She reports that pt has a very good appetite and he consumed 2 full meal trays prior to end of shift last night and also consumed a subway sub at dinner late last night. Noted meal completion of 90% per flowsheet records.   She reveals that pt acknowledged that he had been losing weight intentionally PTA. Pt did not appear to have any physical signs of malnutrition.   Body mass index is 26.85 kg/(m^2). Patient meets criteria for overweight based on current BMI.   Current diet order is  NPO, patient is consuming approximately n/a% of meals at this time. Labs and medications reviewed.   No nutrition interventions warranted at this time. If nutrition issues arise, please consult RD.   Mittie Knittel A. Jimmye Norman, RD, LDN, CDE Pager: [phone] After hours Pager: [phone]

## 2015-11-29 NOTE — Anesthesia Preprocedure Evaluation (Signed)
Anesthesia Evaluation  Patient identified by MRN, date of birth, ID band Patient awake    Reviewed: Allergy & Precautions, NPO status , Patient's Chart, lab work & pertinent test results  Airway Mallampati: II  TM Distance: >3 FB Neck ROM: Full    Dental no notable dental hx.    Pulmonary Current Smoker,    Pulmonary exam normal breath sounds clear to auscultation       Cardiovascular hypertension, + CAD, + Past MI, + Cardiac Stents and +CHF  Normal cardiovascular exam+ dysrhythmias Atrial Fibrillation  Rhythm:Regular Rate:Normal  echo 2/11: EF 50-55%, diast dyfxn, severe LVH, inf HK, LAE   Neuro/Psych Bipolar Disorder CVA    GI/Hepatic negative GI ROS, Neg liver ROS,   Endo/Other  negative endocrine ROSdiabetes  Renal/GU negative Renal ROS  negative genitourinary   Musculoskeletal negative musculoskeletal ROS (+)   Abdominal   Peds negative pediatric ROS (+)  Hematology negative hematology ROS (+)   Anesthesia Other Findings   Reproductive/Obstetrics negative OB ROS                             Anesthesia Physical Anesthesia Plan  ASA: III  Anesthesia Plan: MAC   Post-op Pain Management:    Induction: Intravenous  Airway Management Planned: Nasal Cannula  Additional Equipment:   Intra-op Plan:   Post-operative Plan:   Informed Consent: I have reviewed the patients History and Physical, chart, labs and discussed the procedure including the risks, benefits and alternatives for the proposed anesthesia with the patient or authorized representative who has indicated his/her understanding and acceptance.   Dental advisory given  Plan Discussed with: CRNA and Surgeon  Anesthesia Plan Comments:         Anesthesia Quick Evaluation

## 2015-11-29 NOTE — Discharge Instructions (Signed)
FOLLOW UP WITH [facility] IN [facility] WEEKS.  CONTINUE TAKING ALL OF YOUR MEDICATIONS AS PREVIOUSLY PRESCRIBED:  NOW TAKE PANTOPRAZOLE (PROTONIX) 40 MG PER DAY.  CONTINUE TAKING YOUR ELIQUIS TWICE A DAY  TAKE NYSTATIN LIQUID 5 mL 4 TIMES PER DAY FOR 10 DAYS.

## 2015-11-29 NOTE — Progress Notes (Signed)
  Date: 11/29/2015  Patient name: [name]  Medical record number: [id number]  Date of birth: May 21, 1936   I have seen and evaluated Jearld Pies and discussed their care with the Residency Team. Mr Im is a 79 yo male on Eliquis for A Fib and presumed embolic stroke. He was told at the [place] his HgB was low and to watch his stools for dark tarry stools his he did notice on the day prior to admission. His HgB was 9.6 along with FOBT + stools. His HgB decreased overnight to 9.0. He remains hemodynamically stable. He is currently getting his EGD. Risk factors for UGI bleed -ETOH use daily, age. He is on protonix at home and has minimal NSAID use. He was recently dx and tx for lung cancer which was preceded byt weight loss.  PMHx, Fam Hx, and/or Soc Hx : Lives alone. A Lucianne Lei [facility].  Filed Vitals:   11/29/15 1326 11/29/15 1419  BP: 140/50 125/61  Pulse: 60 73  Temp: 98.7 F (37.1 C) 97.8 F (36.6 C)  Resp: 20 18  NAD. Sitting in bed. LE edema L>R, surgical scar from L leg vein stripping  Ferritin : 109  Assessment and Plan: I have seen and evaluated the patient as outlined above. I agree with the formulated Assessment and Plan as detailed in the residents' admission note, with the following changes:   1. Anemia 2/2 presumed UGI bleed - He is stable and HgB stable. We will wait on results of EGD. Cont PPI and hold eliquis for now. Will need to discuss timing of resuming eliquis once EGD results known.   2. Kidney failure, presumed chronic - he is euvolemic. No need for IVF. This might be new baseline. We do not have old records from [place].  3. A Fib - rate controlled. Hold eliquis.  Bartholomew Crews, MD 12/2/20162:45 PM

## 2015-11-29 NOTE — Transfer of Care (Signed)
Immediate Anesthesia Transfer of Care Note  Patient: [name]  Procedure(s) Performed: Procedure(s): ESOPHAGOGASTRODUODENOSCOPY (EGD) (N/A)  Patient Location: PACU Endo  Anesthesia Type:MAC  Level of Consciousness: awake, patient cooperative and lethargic  Airway & Oxygen Therapy: Patient Spontanous Breathing and Patient connected to nasal cannula oxygen  Post-op Assessment: Report given to RN, Post -op Vital signs reviewed and stable and Patient moving all extremities  Post vital signs: Reviewed and stable  Last Vitals:  BP 91/41  HR 88 RR 21 SpO2 94% Resting comfortably, maintains good airway, denies pain.   Complications: No apparent anesthesia complications

## 2015-11-29 NOTE — Progress Notes (Signed)
Subjective:  Patient was seen and examined this morning. He admits to dark stools, but denies any BRBPR. He denies lightheadedness.    Objective: Vital signs in last 24 hours: Filed Vitals:   11/29/15 1419 11/29/15 1517 11/29/15 1520 11/29/15 1549  BP: 125/61 91/41 125/51 96/54  Pulse: 73  90 85  Temp: 97.8 F (36.6 C) 97.7 F (36.5 C)  97.7 F (36.5 C)  TempSrc: Oral Oral  Oral  Resp: '18  23 20  '$ Height:      Weight:      SpO2: 95% 93% 98% 93%   Weight change:   Intake/Output Summary (Last 24 hours) at 11/29/15 1558 Last data filed at 11/29/15 0900  Gross per 24 hour  Intake    200 ml  Output    250 ml  Net    -50 ml   Filed Vitals:   11/29/15 1419 11/29/15 1517 11/29/15 1520 11/29/15 1549  BP: 125/61 91/41 125/51 96/54  Pulse: 73  90 85  Temp: 97.8 F (36.6 C) 97.7 F (36.5 C)  97.7 F (36.5 C)  TempSrc: Oral Oral  Oral  Resp: '18  23 20  '$ Height:      Weight:      SpO2: 95% 93% 98% 93%   General: Vital signs reviewed.  Patient is well-developed and well-nourished, in no acute distress and cooperative with exam.  Neck: Soft tissue swelling on posterior neck (lipoma?). Tenderness on palpation of left neck musculature from posterior ear to shoulder within left trapezius muscle. Normal ROM. Cardiovascular: Irregularly irregular Pulmonary/Chest: Clear to auscultation bilaterally, no wheezes, rales, or rhonchi. Abdominal: Soft, non-tender, non-distended, BS + Extremities: 1-2+ pitting lower extremity edema bilaterally Neurological: A&O x3 Skin: Warm, dry and intact. No rashes or erythema. Psychiatric: Normal mood and affect. speech and behavior is normal. Cognition and memory are normal.   Lab Results: Basic Metabolic Panel:  Recent Labs Lab 11/28/15 1150 11/29/15 0653  NA 138 135  K 4.6 4.6  CL 102 97*  CO2 30 29  GLUCOSE 293* 362*  BUN 29* 30*  CREATININE 2.15* 2.13*  CALCIUM 9.3 8.9   Liver Function Tests:  Recent Labs Lab 11/28/15 1150  11/29/15 0653  AST 20 17  ALT 10* 9*  ALKPHOS 49 47  BILITOT 0.5 0.7  PROT 7.2 6.9  ALBUMIN 2.8* 2.5*   CBC:  Recent Labs Lab 11/28/15 1150 11/29/15 0653  WBC 5.2 5.1  HGB 9.6* 9.0*  HCT 29.4* 28.8*  MCV 95.5 96.3  PLT 218 205   CBG:  Recent Labs Lab 11/28/15 1632 11/28/15 2058 11/29/15 0803 11/29/15 1131  GLUCAP 80 174* 294* 210*   Hemoglobin A1C:  Recent Labs Lab 11/28/15 1150  HGBA1C 10.4*   Coagulation:  Recent Labs Lab 11/28/15 1150  LABPROT 16.9*  INR 1.36   Anemia Panel:  Recent Labs Lab 11/29/15 0653  FERRITIN 109  TIBC 266  IRON 47  RETICCTPCT 2.7   Urine Drug Screen: Drugs of Abuse     Component Value Date/Time   LABOPIA NONE DETECTED 08/25/2014 1602   COCAINSCRNUR NONE DETECTED 08/25/2014 1602   LABBENZ NONE DETECTED 08/25/2014 1602   AMPHETMU NONE DETECTED 08/25/2014 1602   THCU NONE DETECTED 08/25/2014 1602   LABBARB NONE DETECTED 08/25/2014 1602    Studies/Results: Dg Chest 2 View  11/28/2015  CLINICAL DATA:  Shortness of Breath EXAM: CHEST  2 VIEW COMPARISON:  October 20, 2014 FINDINGS: There is fibrotic change in the bases. There is a  minimal pleural effusion on the right. The lungs elsewhere are clear. Heart is borderline enlarged with pulmonary vascularity within normal limits. No adenopathy. There is atherosclerotic calcification in aorta. No bone lesions. IMPRESSION: No edema or consolidation. Minimal right pleural effusion with fibrotic type change in the bases. Borderline cardiomegaly. There may be a degree of mild underlying congestive heart failure given borderline cardiac enlargement and small right effusion. Electronically Signed   By: Lowella Grip III M.D.   On: 11/28/2015 13:17   Medications:  I have reviewed the patient's current medications. Prior to Admission:  Prescriptions prior to admission  Medication Sig Dispense Refill Last Dose  . albuterol (PROVENTIL HFA;VENTOLIN HFA) 108 (90 BASE) MCG/ACT inhaler  Inhale 2 puffs into the lungs every 6 (six) hours as needed for shortness of breath.   Past Month at Unknown time  . apixaban (ELIQUIS) 5 MG TABS tablet Take 1 tablet (5 mg total) by mouth 2 (two) times daily. 60 tablet 0 11/28/2015 at 0400  . atorvastatin (LIPITOR) 40 MG tablet Take 1 tablet (40 mg total) by mouth daily. 90 tablet 3 11/28/2015 at Unknown time  . cholecalciferol (VITAMIN D) 1000 UNITS tablet Take 1,000 Units by mouth daily.   11/28/2015 at Unknown time  . cilostazol (PLETAL) 50 MG tablet Take 50 mg by mouth 2 (two) times daily.   11/28/2015 at Unknown time  . ciprofloxacin (CIPRO) 500 MG tablet Take 500 mg by mouth 2 (two) times daily.   11/28/2015 at Unknown time  . divalproex (DEPAKOTE) 250 MG EC tablet Take 250 mg by mouth 2 (two) times daily.    11/28/2015 at Unknown time  . finasteride (PROSCAR) 5 MG tablet Take 5 mg by mouth daily.     11/28/2015 at Unknown time  . furosemide (LASIX) 40 MG tablet Take 80 mg by mouth daily.    11/28/2015 at Unknown time  . gabapentin (NEURONTIN) 300 MG capsule Take 300-600 mg by mouth 2 (two) times daily. '600mg'$  in the morning and '300mg'$  in the evening   11/28/2015 at Unknown time  . hydrALAZINE (APRESOLINE) 25 MG tablet Take 25 mg by mouth 2 (two) times daily.   11/28/2015 at Unknown time  . insulin glargine (LANTUS) 100 UNIT/ML injection Inject 40 Units into the skin daily.   11/27/2015 at Unknown time  . insulin regular (HUMULIN R,NOVOLIN R) 100 units/mL injection Inject 5-10 Units into the skin 2 (two) times daily before a meal. Per sliding scale   11/28/2015 at Unknown time  . latanoprost (XALATAN) 0.005 % ophthalmic solution Place 1 drop into both eyes at bedtime.   11/27/2015 at Unknown time  . lisinopril (PRINIVIL,ZESTRIL) 20 MG tablet Take 10 mg by mouth daily.   11/28/2015 at Unknown time  . metoprolol (LOPRESSOR) 50 MG tablet Take 1 tablet (50 mg total) by mouth 2 (two) times daily. 60 tablet 11 11/28/2015 at 0400  . pantoprazole (PROTONIX) 20 MG  tablet Take 20 mg by mouth daily.   11/28/2015 at Unknown time  . simethicone (MYLICON) 80 MG chewable tablet Chew 80 mg by mouth every 6 (six) hours as needed for flatulence.   unknown at unknown  . Tamsulosin HCl (FLOMAX) 0.4 MG CAPS Take 0.4 mg by mouth daily.     11/28/2015 at Unknown time  . travoprost, benzalkonium, (TRAVATAN) 0.004 % ophthalmic solution Place 1 drop into both eyes at bedtime.    11/27/2015 at Unknown time  . vitamin B-12 (CYANOCOBALAMIN) 1000 MCG tablet Take 1,000 mcg by mouth  daily.   11/28/2015 at Unknown time  . sildenafil (VIAGRA) 100 MG tablet Take 100 mg by mouth daily as needed for erectile dysfunction.   unknown at unknown   Scheduled Meds: . atorvastatin  40 mg Oral Daily  . cholecalciferol  1,000 Units Oral Daily  . cilostazol  50 mg Oral BID  . ciprofloxacin  500 mg Oral BID  . diclofenac sodium  2 g Topical QID  . divalproex  250 mg Oral BID  . finasteride  5 mg Oral Daily  . folic acid  1 mg Oral Daily  . furosemide  80 mg Oral Daily  . gabapentin  300 mg Oral QHS  . gabapentin  600 mg Oral Daily  . hydrALAZINE  25 mg Oral BID  . insulin aspart  0-15 Units Subcutaneous TID WC  . insulin glargine  20 Units Subcutaneous Daily  . latanoprost  1 drop Both Eyes QHS  . lisinopril  10 mg Oral Daily  . metoprolol  50 mg Oral BID  . multivitamin with minerals  1 tablet Oral Daily  . nystatin  5 mL Oral QID  . [START ON 11/30/2015] pantoprazole  40 mg Oral Daily  . sodium chloride  3 mL Intravenous Q12H  . tamsulosin  0.4 mg Oral Daily  . thiamine  100 mg Oral Daily   Or  . thiamine  100 mg Intravenous Daily  . Travoprost (BAK Free)  1 drop Both Eyes QHS  . vitamin B-12  1,000 mcg Oral Daily   Continuous Infusions:  PRN Meds:.sodium chloride, acetaminophen **OR** acetaminophen, albuterol, LORazepam **OR** LORazepam, sodium chloride Assessment/Plan: Active Problems:   Anemia   GIB (gastrointestinal bleeding)  Anemia in Setting of GI bleed: +FOBT, dark  stools, anemia while on Eliquis. Eliquis was held, hemoglobin remained stable overnight from 9.6 to 9.0. Patient was for EGD today which revealed early candida esophagitis and a few scattered erosions in the prepyloric antrum. Dr. Penelope Coop believes melena was likely related to the antral erosions oozing in the face of his being on anticoagulation. He recommends PPI therapy and continuing Eliquis. There was no active bleeding on examination.  -Protonix 40 mg daily -Continue Eliquis 5 mg BID  Early Candidal Esophagitis: HIV negative. GI recommends nystatin oral suspension 4 times a day for 10 days for Candida esophagitis. -Nystatin 5 mL QID for 10 days  Complicated UTI: Continues to complain of dysuria. He has completed three doses of ciprofloxacin. His total course should be five days - Continue ciprofloxacin 500 mg until 12/3  Atrial Fibrillation: On Eliquis given CHADS-VASc score and history of CVA. Rate controlled. -Rate controlled with Metoprolol 50 mg BID -Restart Eliquis  Chronic Kidney Disease: Last creatinine was last year with a baseline of 1.5-1.6. Creatinine on admission was 2.15 which remained stable today. This is likely around his new baseline.  -Continue current medications, follow up with PCP  Lung Malignancy: Subtype and staging unknown. He reports it has responded to radiation, which he completed last week.  -Follow up with PCP and Rad Onc  Chronic Systolic CHF (EF 80-99%): +LEE bilaterally, but no evidence of crackles or shortness of breath. Not clinically in an exacerbation.  -Continue home medications   T2DM with Neuropathy: Glucose of 293 on admission. On 40U lantus daily at home with 5-10U Humalin R BID per sliding scale. -Placed on 30U lantus daily with Moderate SSI -Continue Gabapentin 600 mg AM, 300 mg PM  PVD: Continue Cilostazol daily.   BPH: Continue finasteride and tamsulosin.  Alcohol Use Disorder: CIWA protocol with vitamins and thiamine.   Bipolar  Disorder: Stable. Continue depakote.  GERD: Continue Protonix 40 daily.  DVT Prophylaxis: SCDs Code Status: Full Diet: Heart Healthy/Carb Mod  Dispo:  Anticipated discharge today.  The patient does have a current PCP Jilda Panda, MD) and does not need an [facility] hospital follow-up appointment after discharge.  The patient does not have transportation limitations that hinder transportation to clinic appointments.  .Services Needed at time of discharge: Y = Yes, Blank = No PT:   OT:   RN:   Equipment:   Other:     LOS: 1 day   Osa Craver, DO PGY-1 Internal Medicine Resident Pager # [phone] 11/29/2015 3:58 PM

## 2015-11-29 NOTE — Anesthesia Postprocedure Evaluation (Signed)
Anesthesia Post Note  Patient: [name]  Procedure(s) Performed: Procedure(s) (LRB): ESOPHAGOGASTRODUODENOSCOPY (EGD) (N/A)  Patient location during evaluation: PACU Anesthesia Type: MAC Level of consciousness: awake and alert Pain management: pain level controlled Vital Signs Assessment: post-procedure vital signs reviewed and stable Respiratory status: spontaneous breathing, nonlabored ventilation, respiratory function stable and patient connected to nasal cannula oxygen Cardiovascular status: blood pressure returned to baseline and stable Postop Assessment: no signs of nausea or vomiting Anesthetic complications: no    Last Vitals:  Filed Vitals:   11/29/15 1520 11/29/15 1549  BP: 125/51 96/54  Pulse: 90 85  Temp:  36.5 C  Resp: 23 20    Last Pain:  Filed Vitals:   11/29/15 1550  PainSc: 9                  Kacy Hegna S

## 2015-11-29 NOTE — Progress Notes (Signed)
Inpatient Diabetes Program Recommendations  AACE/ADA: New Consensus Statement on Inpatient Glycemic Control (2015)  Target Ranges:  Prepandial:   less than 140 mg/dL      Peak postprandial:   less than 180 mg/dL (1-2 hours)      Critically ill patients:  140 - 180 mg/dL   Review of Glycemic Control:  Results for [name], [name] (MRN [id number]) as of 11/29/2015 11:31  Ref. Range 11/28/2015 16:32 11/28/2015 20:58 11/29/2015 08:03  Glucose-Capillary Latest Ref Range: 65-99 mg/dL 80 174 (H) 294 (H)  Results for [name], [name] (MRN [id number]) as of 11/29/2015 11:31  Ref. Range 11/28/2015 11:50  Hemoglobin A1C Latest Ref Range: 4.8-5.6 % 10.4 (H)   Diabetes history: Type 2 diabetes Outpatient Diabetes medications: Lantus 40 units daily, Humulin R 5-10 units bid with meals Current orders for Inpatient glycemic control:  Novolog moderate tid with meals, Lantus 20 units daily (started this morning)  Inpatient Diabetes Program Recommendations:   Agree with current medication regimen.  Note that A1C is elevated however patient also anemic which could alter A1C results.  Will follow.  Thanks, Adah Perl, RN, BC-ADM Inpatient Diabetes Coordinator Pager [phone] (8a-5p)

## 2015-12-02 ENCOUNTER — Encounter (HOSPITAL_COMMUNITY): Payer: Self-pay | Admitting: Gastroenterology

## 2015-12-23 ENCOUNTER — Observation Stay (HOSPITAL_COMMUNITY)
Admission: EM | Admit: 2015-12-23 | Discharge: 2015-12-25 | Disposition: A | Discharge status: Home / Self Care | Payer: Medicare Other | Attending: Internal Medicine | Admitting: Internal Medicine

## 2015-12-23 ENCOUNTER — Encounter (HOSPITAL_COMMUNITY): Payer: Self-pay | Admitting: *Deleted

## 2015-12-23 ENCOUNTER — Emergency Department (HOSPITAL_COMMUNITY)
Admission: EM | Admit: 2015-12-23 | Discharge: 2015-12-23 | Discharge status: Absent without leave | Payer: Medicare Other | Source: Home / Self Care

## 2015-12-23 DIAGNOSIS — E785 Hyperlipidemia, unspecified: Secondary | ICD-10-CM | POA: Diagnosis not present

## 2015-12-23 DIAGNOSIS — I130 Hypertensive heart and chronic kidney disease with heart failure and stage 1 through stage 4 chronic kidney disease, or unspecified chronic kidney disease: Secondary | ICD-10-CM | POA: Diagnosis not present

## 2015-12-23 DIAGNOSIS — Z923 Personal history of irradiation: Secondary | ICD-10-CM | POA: Insufficient documentation

## 2015-12-23 DIAGNOSIS — K921 Melena: Secondary | ICD-10-CM | POA: Diagnosis present

## 2015-12-23 DIAGNOSIS — I2510 Atherosclerotic heart disease of native coronary artery without angina pectoris: Secondary | ICD-10-CM | POA: Diagnosis not present

## 2015-12-23 DIAGNOSIS — K219 Gastro-esophageal reflux disease without esophagitis: Secondary | ICD-10-CM | POA: Diagnosis not present

## 2015-12-23 DIAGNOSIS — I5022 Chronic systolic (congestive) heart failure: Secondary | ICD-10-CM | POA: Insufficient documentation

## 2015-12-23 DIAGNOSIS — Z7901 Long term (current) use of anticoagulants: Secondary | ICD-10-CM | POA: Diagnosis not present

## 2015-12-23 DIAGNOSIS — D649 Anemia, unspecified: Secondary | ICD-10-CM | POA: Diagnosis not present

## 2015-12-23 DIAGNOSIS — Z794 Long term (current) use of insulin: Secondary | ICD-10-CM | POA: Insufficient documentation

## 2015-12-23 DIAGNOSIS — N183 Chronic kidney disease, stage 3 (moderate): Secondary | ICD-10-CM | POA: Insufficient documentation

## 2015-12-23 DIAGNOSIS — E1129 Type 2 diabetes mellitus with other diabetic kidney complication: Secondary | ICD-10-CM | POA: Diagnosis present

## 2015-12-23 DIAGNOSIS — F17210 Nicotine dependence, cigarettes, uncomplicated: Secondary | ICD-10-CM | POA: Insufficient documentation

## 2015-12-23 DIAGNOSIS — K31819 Angiodysplasia of stomach and duodenum without bleeding: Secondary | ICD-10-CM | POA: Diagnosis not present

## 2015-12-23 DIAGNOSIS — F1010 Alcohol abuse, uncomplicated: Secondary | ICD-10-CM | POA: Insufficient documentation

## 2015-12-23 DIAGNOSIS — Z79899 Other long term (current) drug therapy: Secondary | ICD-10-CM | POA: Diagnosis not present

## 2015-12-23 DIAGNOSIS — K922 Gastrointestinal hemorrhage, unspecified: Principal | ICD-10-CM | POA: Diagnosis present

## 2015-12-23 DIAGNOSIS — E1122 Type 2 diabetes mellitus with diabetic chronic kidney disease: Secondary | ICD-10-CM | POA: Insufficient documentation

## 2015-12-23 DIAGNOSIS — R05 Cough: Secondary | ICD-10-CM | POA: Diagnosis not present

## 2015-12-23 DIAGNOSIS — I482 Chronic atrial fibrillation, unspecified: Secondary | ICD-10-CM | POA: Diagnosis present

## 2015-12-23 DIAGNOSIS — I252 Old myocardial infarction: Secondary | ICD-10-CM | POA: Insufficient documentation

## 2015-12-23 DIAGNOSIS — Z8673 Personal history of transient ischemic attack (TIA), and cerebral infarction without residual deficits: Secondary | ICD-10-CM | POA: Diagnosis not present

## 2015-12-23 DIAGNOSIS — B3781 Candidal esophagitis: Secondary | ICD-10-CM | POA: Insufficient documentation

## 2015-12-23 DIAGNOSIS — E1151 Type 2 diabetes mellitus with diabetic peripheral angiopathy without gangrene: Secondary | ICD-10-CM | POA: Diagnosis not present

## 2015-12-23 DIAGNOSIS — I4891 Unspecified atrial fibrillation: Secondary | ICD-10-CM | POA: Insufficient documentation

## 2015-12-23 DIAGNOSIS — K449 Diaphragmatic hernia without obstruction or gangrene: Secondary | ICD-10-CM | POA: Diagnosis not present

## 2015-12-23 DIAGNOSIS — K2970 Gastritis, unspecified, without bleeding: Secondary | ICD-10-CM | POA: Diagnosis not present

## 2015-12-23 LAB — COMPREHENSIVE METABOLIC PANEL
ALBUMIN: 3.1 g/dL — AB (ref 3.5–5.0)
ALK PHOS: 44 U/L (ref 38–126)
ALT: 11 U/L — ABNORMAL LOW (ref 17–63)
ANION GAP: 10 (ref 5–15)
AST: 20 U/L (ref 15–41)
BILIRUBIN TOTAL: 0.7 mg/dL (ref 0.3–1.2)
BUN: 41 mg/dL — AB (ref 6–20)
CALCIUM: 9.3 mg/dL (ref 8.9–10.3)
CO2: 25 mmol/L (ref 22–32)
Chloride: 99 mmol/L — ABNORMAL LOW (ref 101–111)
Creatinine, Ser: 2.43 mg/dL — ABNORMAL HIGH (ref 0.61–1.24)
GFR calc Af Amer: 28 mL/min — ABNORMAL LOW (ref >60)
GFR calc non Af Amer: 24 mL/min — ABNORMAL LOW (ref >60)
GLUCOSE: 364 mg/dL — AB (ref 65–99)
Potassium: 4.7 mmol/L (ref 3.5–5.1)
Sodium: 134 mmol/L — ABNORMAL LOW (ref 135–145)
TOTAL PROTEIN: 7.3 g/dL (ref 6.5–8.1)

## 2015-12-23 LAB — CBC
HCT: 34.4 % — ABNORMAL LOW (ref 39.0–52.0)
HEMOGLOBIN: 11 g/dL — AB (ref 13.0–17.0)
MCH: 32.1 pg (ref 26.0–34.0)
MCHC: 32 g/dL (ref 30.0–36.0)
MCV: 100.3 fL — ABNORMAL HIGH (ref 78.0–100.0)
Platelets: 219 10*3/uL (ref 150–400)
RBC: 3.43 MIL/uL — ABNORMAL LOW (ref 4.22–5.81)
RDW: 16.4 % — AB (ref 11.5–15.5)
WBC: 4.2 10*3/uL (ref 4.0–10.5)

## 2015-12-23 LAB — GLUCOSE, CAPILLARY: GLUCOSE-CAPILLARY: 287 mg/dL — AB (ref 65–99)

## 2015-12-23 LAB — TYPE AND SCREEN
ABO/RH(D): O POS
ANTIBODY SCREEN: NEGATIVE

## 2015-12-23 LAB — POC OCCULT BLOOD, ED: Fecal Occult Bld: POSITIVE — AB

## 2015-12-23 LAB — PROTIME-INR
INR: 1.36 (ref 0.00–1.49)
Prothrombin Time: 16.9 seconds — ABNORMAL HIGH (ref 11.6–15.2)

## 2015-12-23 MED ORDER — INSULIN ASPART 100 UNIT/ML ~~LOC~~ SOLN
0.0000 [IU] | Freq: Every day | SUBCUTANEOUS | Status: DC
  Administered 2015-12-23: 3 [IU] via SUBCUTANEOUS
  Administered 2015-12-24: 2 [IU] via SUBCUTANEOUS

## 2015-12-23 MED ORDER — INSULIN GLARGINE 100 UNIT/ML ~~LOC~~ SOLN
30.0000 [IU] | Freq: Every day | SUBCUTANEOUS | Status: DC
  Administered 2015-12-24: 30 [IU] via SUBCUTANEOUS
  Filled 2015-12-23 (×2): qty 0.3

## 2015-12-23 MED ORDER — SODIUM CHLORIDE 0.9 % IV SOLN
INTRAVENOUS | Status: AC
  Administered 2015-12-23: 21:00:00 via INTRAVENOUS

## 2015-12-23 MED ORDER — INSULIN ASPART 100 UNIT/ML ~~LOC~~ SOLN
0.0000 [IU] | Freq: Three times a day (TID) | SUBCUTANEOUS | Status: DC
  Administered 2015-12-24: 2 [IU] via SUBCUTANEOUS
  Administered 2015-12-24: 7 [IU] via SUBCUTANEOUS
  Administered 2015-12-24: 9 [IU] via SUBCUTANEOUS
  Administered 2015-12-25: 2 [IU] via SUBCUTANEOUS
  Administered 2015-12-25: 1 [IU] via SUBCUTANEOUS

## 2015-12-23 MED ORDER — PANTOPRAZOLE SODIUM 40 MG IV SOLR
40.0000 mg | Freq: Once | INTRAVENOUS | Status: AC
  Administered 2015-12-23: 40 mg via INTRAVENOUS
  Filled 2015-12-23: qty 40

## 2015-12-23 MED ORDER — SODIUM CHLORIDE 0.9 % IJ SOLN
3.0000 mL | Freq: Two times a day (BID) | INTRAMUSCULAR | Status: DC
  Administered 2015-12-23 – 2015-12-25 (×3): 3 mL via INTRAVENOUS

## 2015-12-23 MED ORDER — GUAIFENESIN-DM 100-10 MG/5ML PO SYRP
5.0000 mL | ORAL_SOLUTION | Freq: Once | ORAL | Status: AC
  Administered 2015-12-24: 5 mL via ORAL
  Filled 2015-12-23: qty 5

## 2015-12-23 NOTE — H&P (Signed)
Date: 12/24/2015               Patient Name:  [name] MRN: [id number]  DOB: [birth year] Age / Sex: 79 y.o., male   PCP: Jilda Panda, MD         Medical Service: Internal Medicine Teaching Service         Attending Physician: Dr. Aldine Contes, MD    First Contact: Dr. Jule Ser Pager: [phone]  Second Contact: Dr. Hoover Browns Pager: [phone]       After Hours (After 5p/  First Contact Pager: [phone]  weekends / holidays): Second Contact Pager: [phone]   Chief Complaint: Dark stools  History of Present Illness: [name] is a 79 year old man with PMH of atrial fibrillation on Eliquis (CHADSVASC 8), CVA, CHF (EF 40-45% 07/2014), CAD, CKD stage 3, HTN, PVD, and reported lung cancer (s/p radiation at [place]) who presents with dark black stool. Patient was previously admitted from 12/1-12/2 for melanotic stool. EGD at the time showed few scattered erosions in the prepyloric antrum of the stomach, early candidal esophagitis, but no evidence for active bleeding. His melena was attributed to oozing in the setting of Eliquis therapy. GI recommended PPI therapy with Protonix and continuing Eliquis on discharge and nystatin oral suspension. His Hgb during that admission was stable at 9 or above. Patient reports that he was doing well after discharge, completed Nystatin treatment and adherent to Protonix. He says his stools were normal until last Thursday when he noticed reoccurrence of dark black stools similar to his prior GI bleed. He went to urgent care and was noted to have systolic BP in the 35T and advised for him to come to the ED for further evaluation. Patient reports associated symptoms of fatigue, but no lightheadedness, dizziness, syncope, fall, BRBPR, hematemesis, hemoptysis, epistaxis, chest pain, SOB, N/V/D/C, or abdominal pain. He reports Advil use once a month. He reports having a colonoscopy several years ago and says they found a polyp and is to have repeat  colonoscopy 5 years after last.  ED vitals: BP 117/67 mmHg  Pulse 86  Temp(Src) 98.7 F (37.1 C) (Oral)  Resp 24  SpO2 92%. FOBT positive, Hgb 11.0, PT/INR 16.9/1.36.  Meds: Current Facility-Administered Medications  Medication Dose Route Frequency Provider Last Rate Last Dose  . 0.9 %  sodium chloride infusion   Intravenous Continuous Juliet Rude, MD 75 mL/hr at 12/23/15 2127    . insulin aspart (novoLOG) injection 0-5 Units  0-5 Units Subcutaneous QHS Juliet Rude, MD   3 Units at 12/23/15 2213  . insulin aspart (novoLOG) injection 0-9 Units  0-9 Units Subcutaneous TID WC Carly J Rivet, MD      . insulin glargine (LANTUS) injection 30 Units  30 Units Subcutaneous Daily Carly J Rivet, MD      . sodium chloride 0.9 % injection 3 mL  3 mL Intravenous Q12H Juliet Rude, MD   3 mL at 12/23/15 2125    Allergies: Allergies as of 12/23/2015 - Review Complete 12/23/2015  Allergen Reaction Noted  . Penicillins Other (See Comments) 03/11/2010   Past Medical History  Diagnosis Date  . Hyperlipidemia   . HTN (hypertension)     echo 2/11: EF 50-55%, diast dyfxn, severe LVH, inf HK, LAE  . CAD (coronary artery disease)     a.  NSTEMI treated with PCI Feb 2011 with a DES.([facility]);   b. cath 2/11: D1 stents x 2 ok, AV groove  CFX occluded with dist AV CFX filled by L-L collats, RCA occluded, OM2 90-95% (treated with PCI)  . MI (myocardial infarction) ([place])   . PVD (peripheral vascular disease) ([place])   . DM (diabetes mellitus) ([place])   . GERD (gastroesophageal reflux disease)   . Bipolar disorder ([place])   . Prostate cancer [facility])     status post radiation therapy in 2003  . Pulmonary edema   . Atrial fibrillation ([place])   . Respiratory difficulty 06/24/2014    intubated   . Hypertensive emergency 06/24/2014  . CKD (chronic kidney disease), stage III   . Anemia 11/28/2015  . Shortness of breath dyspnea   . Stroke [facility]) 07/2014   Past Surgical History  Procedure Laterality Date  .  Femoral bypass  2003  . Esophagogastroduodenoscopy N/A 11/29/2015    Procedure: ESOPHAGOGASTRODUODENOSCOPY (EGD);  Surgeon: Wonda Horner, MD;  Location: [facility] ENDOSCOPY;  Service: Endoscopy;  Laterality: N/A;   Family History  Problem Relation Age of Onset  . Cancer Mother   . Cancer Father   . Cancer Sister    Social History   Social History  . Marital Status: Divorced    Spouse Name: N/A  . Number of Children: N/A  . Years of Education: N/A   Occupational History  . retired Architect    Social History Main Topics  . Smoking status: Current Every Day Smoker -- 0.50 packs/day for 50 years    Types: Cigarettes  . Smokeless tobacco: Never Used     Comment: He has a 50-pack-year hx of tobacco abuse. Currently, smoking about half a pack a day.  . Alcohol Use: Yes     Comment: Previously drank heavily, and now has a drink every other day or so.  . Drug Use: No  . Sexual Activity: Not on file   Other Topics Concern  . Not on file   Social History Narrative    Review of Systems: Review of Systems  Constitutional: Positive for malaise/fatigue. Negative for fever, chills and diaphoresis.  Respiratory: Negative for cough, hemoptysis, shortness of breath and wheezing.   Cardiovascular: Positive for leg swelling. Negative for chest pain and palpitations.  Gastrointestinal: Positive for diarrhea, blood in stool and melena. Negative for nausea, vomiting, abdominal pain and constipation.  Genitourinary: Negative for dysuria and hematuria.  Musculoskeletal: Negative for myalgias, joint pain and falls.  Skin: Negative for rash.  Neurological: Negative for dizziness, tingling, focal weakness, loss of consciousness and headaches.     Physical Exam: Blood pressure 106/57, pulse 97, temperature 96.6 F (35.9 C), temperature source Oral, resp. rate 17, height 6' (1.829 m), weight 187 lb 6.3 oz (85 kg), SpO2 99 %. Physical Exam  Constitutional: He is oriented to person, place, and time.  He appears well-developed and well-nourished. No distress.  HENT:  Head: Normocephalic and atraumatic.  Mouth/Throat: Oropharynx is clear and moist.  Eyes: EOM are normal.  Neck: Normal range of motion.  Cardiovascular: Intact distal pulses.  An irregularly irregular rhythm present. Bradycardia present.   No murmur heard. Pulmonary/Chest: Effort normal.  Faint expiratory wheezing bilaterally  Abdominal: Soft. Bowel sounds are normal. There is no tenderness.  Neurological: He is alert and oriented to person, place, and time.  Skin: Skin is warm. He is not diaphoretic.  Psychiatric: He has a normal mood and affect.     Lab results: Basic Metabolic Panel:  Recent Labs  12/23/15 1818  NA 134*  K 4.7  CL 99*  CO2 25  GLUCOSE 364*  BUN 41*  CREATININE 2.43*  CALCIUM 9.3   Liver Function Tests:  Recent Labs  12/23/15 1818  AST 20  ALT 11*  ALKPHOS 44  BILITOT 0.7  PROT 7.3  ALBUMIN 3.1*   No results for input(s): LIPASE, AMYLASE in the last 72 hours. No results for input(s): AMMONIA in the last 72 hours. CBC:  Recent Labs  12/23/15 1818  WBC 4.2  HGB 11.0*  HCT 34.4*  MCV 100.3*  PLT 219   Cardiac Enzymes: No results for input(s): CKTOTAL, CKMB, CKMBINDEX, TROPONINI in the last 72 hours. BNP: No results for input(s): PROBNP in the last 72 hours. D-Dimer: No results for input(s): DDIMER in the last 72 hours. CBG:  Recent Labs  12/23/15 2147  GLUCAP 287*   Hemoglobin A1C: No results for input(s): HGBA1C in the last 72 hours. Fasting Lipid Panel: No results for input(s): CHOL, HDL, LDLCALC, TRIG, CHOLHDL, LDLDIRECT in the last 72 hours. Thyroid Function Tests: No results for input(s): TSH, T4TOTAL, FREET4, T3FREE, THYROIDAB in the last 72 hours. Anemia Panel: No results for input(s): VITAMINB12, FOLATE, FERRITIN, TIBC, IRON, RETICCTPCT in the last 72 hours. Coagulation:  Recent Labs  12/23/15 1818  LABPROT 16.9*  INR 1.36   Urine Drug  Screen: Drugs of Abuse     Component Value Date/Time   LABOPIA NONE DETECTED 08/25/2014 1602   COCAINSCRNUR NONE DETECTED 08/25/2014 1602   LABBENZ NONE DETECTED 08/25/2014 1602   AMPHETMU NONE DETECTED 08/25/2014 1602   THCU NONE DETECTED 08/25/2014 1602   LABBARB NONE DETECTED 08/25/2014 1602    Alcohol Level: No results for input(s): ETH in the last 72 hours. Urinalysis: No results for input(s): COLORURINE, LABSPEC, PHURINE, GLUCOSEU, HGBUR, BILIRUBINUR, KETONESUR, PROTEINUR, UROBILINOGEN, NITRITE, LEUKOCYTESUR in the last 72 hours.  Invalid input(s): APPERANCEUR   Imaging results:  No results found.  Other results: EKG: atrial fibrillation, RBBB.  Assessment & Plan by Problem: Principal Problem:   GI bleed Active Problems:   DM (diabetes mellitus), type 2 with renal complications (HCC)   Atrial fibrillation [facility])  [name] is a 79 year old man with PMH of atrial fibrillation on Eliquis (CHADSVASC 8), CVA, CHF (EF 40-45% 07/2014), CAD, CKD stage 3, HTN, PVD, and reported lung cancer (s/p radiation at [place]) who presents with dark black stool.   Dispo: Disposition is deferred at this time, awaiting improvement of current medical problems. Anticipated discharge in approximately 1-3 day(s).  GI Bleed: Patient with recurrence of melanotic stool. EGD on 12/2 without evidence for active bleeding, thought secondary to oozing from Eliquis use. Eliquis was continued and PPI was added. He reportedly had normal appearing stool in last week. His only reported associated symptom is fatigue. FOBT is positive, Vital signs are stable and Hgb stable at 11.0 (9.0 earlier this month). It is possible he may have bleeding from distal small bowel, AVM, or small bleed from Eliquis therapy. GI has been consulted with plan to see in the morning. -GI consulted, appreciate assistance -Hold Eliquis -CBC in am -IV NS 75 mL/hr for 12 hours  Atrial Fibrillation: Initially bradycardic on exam,  now rate controlled <110. On Eliquis 5 mg BID and Carvedilol 12.5 mg BID at home. -Hold Eliquis -Hold Carvedilol  T2DM: On Lantus 40 units every morning and Humulin 5-7 units twice daily before meals per sliding scale at home -SSI-Sensitive -Lantus 30 units every am  The patient does have a current PCP Jilda Panda, MD) and does not need an  [facility] hospital follow-up appointment after discharge.  The patient does not have transportation limitations that hinder transportation to clinic appointments.  Signed: Zada Finders, MD 12/24/2015, 12:45 AM

## 2015-12-23 NOTE — Progress Notes (Signed)
Patient refused SCD despite educating him on the importance of SCD. Patient requested for some cough syrup due to cough and thin, tan phlegm.  Patient just finished his sandwich.

## 2015-12-23 NOTE — ED Notes (Signed)
Report attempted. Number left for nurse to call back.

## 2015-12-23 NOTE — ED Notes (Signed)
Pt reports dark colored stools since Thursday. Pt states that he has had his happen in the past. Pt denies any abdominal pain/red blood.

## 2015-12-23 NOTE — ED Provider Notes (Signed)
CSN: [id number]     Arrival date & time 12/23/15  1715 History   First MD Initiated Contact with Patient 12/23/15 1821     Chief Complaint  Patient presents with  . Blood In Stools     (Consider location/radiation/quality/duration/timing/severity/associated sxs/prior Treatment) Patient is a 79 y.o. male presenting with hematochezia. The history is provided by the patient and a relative.  Rectal Bleeding Quality:  Black and tarry Amount:  Moderate Duration:  4 days Timing:  Constant Progression:  Worsening Chronicity:  Recurrent Context: not anal penetration, not constipation, not defecation, not diarrhea, not foreign body, not hemorrhoids, not rectal injury and not rectal pain   Similar prior episodes: yes   Relieved by:  None tried Worsened by:  Nothing tried Ineffective treatments:  None tried Associated symptoms: no abdominal pain, no dizziness, no epistaxis, no fever, no hematemesis, no light-headedness, no loss of consciousness, no recent illness and no vomiting   Risk factors: anticoagulant use     Past Medical History  Diagnosis Date  . Hyperlipidemia   . HTN (hypertension)     echo 2/11: EF 50-55%, diast dyfxn, severe LVH, inf HK, LAE  . CAD (coronary artery disease)     a.  NSTEMI treated with PCI Feb 2011 with a DES.([facility]);   b. cath 2/11: D1 stents x 2 ok, AV groove CFX occluded with dist AV CFX filled by L-L collats, RCA occluded, OM2 90-95% (treated with PCI)  . MI (myocardial infarction) ([place])   . PVD (peripheral vascular disease) ([place])   . DM (diabetes mellitus) ([place])   . GERD (gastroesophageal reflux disease)   . Bipolar disorder ([place])   . Prostate cancer [facility])     status post radiation therapy in 2003  . Pulmonary edema   . Atrial fibrillation ([place])   . Respiratory difficulty 06/24/2014    intubated   . Hypertensive emergency 06/24/2014  . CKD (chronic kidney disease), stage III   . Anemia 11/28/2015  . Shortness of breath dyspnea   . Stroke [facility])  07/2014   Past Surgical History  Procedure Laterality Date  . Femoral bypass  2003  . Esophagogastroduodenoscopy N/A 11/29/2015    Procedure: ESOPHAGOGASTRODUODENOSCOPY (EGD);  Surgeon: Wonda Horner, MD;  Location: [facility] ENDOSCOPY;  Service: Endoscopy;  Laterality: N/A;   Family History  Problem Relation Age of Onset  . Cancer Mother   . Cancer Father   . Cancer Sister    Social History  Substance Use Topics  . Smoking status: Current Every Day Smoker -- 0.50 packs/day for 50 years    Types: Cigarettes  . Smokeless tobacco: Never Used     Comment: He has a 50-pack-year hx of tobacco abuse. Currently, smoking about half a pack a day.  . Alcohol Use: Yes     Comment: Previously drank heavily, and now has a drink every other day or so.    Review of Systems  Constitutional: Negative for fever and appetite change.  HENT: Negative for congestion and nosebleeds.   Eyes: Negative for pain.  Respiratory: Negative for cough, chest tightness and shortness of breath.   Cardiovascular: Negative for chest pain.  Gastrointestinal: Positive for blood in stool and hematochezia. Negative for nausea, vomiting, abdominal pain, diarrhea, constipation, abdominal distention and hematemesis.  Genitourinary: Negative for dysuria.  Musculoskeletal: Negative for back pain and gait problem.  Neurological: Negative for dizziness, loss of consciousness, speech difficulty and light-headedness.  Psychiatric/Behavioral: Negative for confusion.      Allergies  Penicillins  Home Medications   Prior to Admission medications   Medication Sig Start Date End Date Taking? Authorizing Provider  acetaminophen (TYLENOL) 325 MG tablet Take 650 mg by mouth every 6 (six) hours as needed for fever (pain).   Yes Historical Provider, MD  albuterol (PROVENTIL HFA;VENTOLIN HFA) 108 (90 BASE) MCG/ACT inhaler Inhale 2 puffs into the lungs every 6 (six) hours as needed for shortness of breath.   Yes Historical Provider, MD   apixaban (ELIQUIS) 5 MG TABS tablet Take 1 tablet (5 mg total) by mouth 2 (two) times daily. Patient taking differently: Take 5 mg by mouth every 12 (twelve) hours.  08/28/14  Yes Thurnell Lose, MD  atorvastatin (LIPITOR) 40 MG tablet Take 1 tablet (40 mg total) by mouth daily. Patient taking differently: Take 20 mg by mouth at bedtime.  08/01/14  Yes Sherren Mocha, MD  carvedilol (COREG) 12.5 MG tablet Take 12.5 mg by mouth 2 (two) times daily with a meal.   Yes Historical Provider, MD  cholecalciferol (VITAMIN D) 1000 UNITS tablet Take 1,000 Units by mouth daily.   Yes Historical Provider, MD  cilostazol (PLETAL) 50 MG tablet Take 50 mg by mouth every 12 (twelve) hours.    Yes Historical Provider, MD  dextrose (GLUTOSE) 40 % GEL Take 1 Tube by mouth once as needed for low blood sugar.   Yes Historical Provider, MD  diclofenac sodium (VOLTAREN) 1 % GEL Apply 2 g topically 4 (four) times daily. 11/29/15  Yes Alexa Sherral Hammers, MD  feeding supplement, GLUCERNA SHAKE, (GLUCERNA SHAKE) LIQD Take 237 mLs by mouth 3 (three) times daily between meals.   Yes Historical Provider, MD  finasteride (PROSCAR) 5 MG tablet Take 5 mg by mouth daily.     Yes Historical Provider, MD  furosemide (LASIX) 40 MG tablet Take 80 mg by mouth daily.    Yes Historical Provider, MD  gabapentin (NEURONTIN) 300 MG capsule Take 300 mg by mouth 2 (two) times daily.    Yes Historical Provider, MD  insulin glargine (LANTUS) 100 UNIT/ML injection Inject 40 Units into the skin daily. 07/20/14  Yes Geradine Girt, DO  insulin regular (HUMULIN R,NOVOLIN R) 100 units/mL injection Inject 5-7 Units into the skin 2 (two) times daily before a meal. Per sliding scale   Yes Historical Provider, MD  latanoprost (XALATAN) 0.005 % ophthalmic solution Place 1 drop into both eyes at bedtime.   Yes Historical Provider, MD  lisinopril (PRINIVIL,ZESTRIL) 20 MG tablet Take 10 mg by mouth daily.   Yes Historical Provider, MD  nystatin (MYCOSTATIN)  100000 UNIT/ML suspension Take 5 mLs (500,000 Units total) by mouth 4 (four) times daily. 11/29/15  Yes Alexa Sherral Hammers, MD  pantoprazole (PROTONIX) 40 MG tablet Take 1 tablet (40 mg total) by mouth daily. 11/29/15  Yes Alexa Sherral Hammers, MD  simethicone (MYLICON) 80 MG chewable tablet Chew 80 mg by mouth 3 (three) times daily as needed for flatulence. Take with meals as needed   Yes Historical Provider, MD  Tamsulosin HCl (FLOMAX) 0.4 MG CAPS Take 0.4 mg by mouth daily.     Yes Historical Provider, MD  travoprost, benzalkonium, (TRAVATAN) 0.004 % ophthalmic solution Place 1 drop into both eyes at bedtime.    Yes Historical Provider, MD  vitamin B-12 (CYANOCOBALAMIN) 1000 MCG tablet Take 1,000 mcg by mouth daily.   Yes Historical Provider, MD   BP 117/67 mmHg  Pulse 86  Temp(Src) 98.7 F (37.1 C) (Oral)  Resp 24  SpO2 92%  Physical Exam  Constitutional: He is oriented to person, place, and time. He appears well-developed and well-nourished. No distress.  HENT:  Head: Normocephalic and atraumatic.  Eyes: Conjunctivae and EOM are normal. Pupils are equal, round, and reactive to light.  Neck: Normal range of motion. Neck supple.  Cardiovascular: Normal rate and regular rhythm.   Pulmonary/Chest: Effort normal and breath sounds normal. No respiratory distress. He has no wheezes. He has no rales. He exhibits no tenderness.  Abdominal: Soft. Bowel sounds are normal. He exhibits no distension and no mass. There is no tenderness. There is no rigidity, no rebound, no guarding, no CVA tenderness, no tenderness at McBurney's point and negative Murphy's sign.  Musculoskeletal: Normal range of motion.  Neurological: He is alert and oriented to person, place, and time. No cranial nerve deficit.  Skin: Skin is warm and dry. He is not diaphoretic.  Psychiatric: He has a normal mood and affect.    ED Course  Procedures (including critical care time) Labs Review Labs Reviewed  COMPREHENSIVE METABOLIC  PANEL - Abnormal; Notable for the following:    Sodium 134 (*)    Chloride 99 (*)    Glucose, Bld 364 (*)    BUN 41 (*)    Creatinine, Ser 2.43 (*)    Albumin 3.1 (*)    ALT 11 (*)    GFR calc non Af Amer 24 (*)    GFR calc Af Amer 28 (*)    All other components within normal limits  CBC - Abnormal; Notable for the following:    RBC 3.43 (*)    Hemoglobin 11.0 (*)    HCT 34.4 (*)    MCV 100.3 (*)    RDW 16.4 (*)    All other components within normal limits  PROTIME-INR - Abnormal; Notable for the following:    Prothrombin Time 16.9 (*)    All other components within normal limits  POC OCCULT BLOOD, ED - Abnormal; Notable for the following:    Fecal Occult Bld POSITIVE (*)    All other components within normal limits  TYPE AND SCREEN    Imaging Review No results found. I have personally reviewed and evaluated these images and lab results as part of my medical decision-making.   EKG Interpretation None      MDM   Final diagnoses:  Gastrointestinal hemorrhage, unspecified gastritis, unspecified gastrointestinal hemorrhage type    79 year old African American male with past nuchal history of A. fib on Xarelto with recent admission for GI bleed presents to the setting of dark tarry stools. Patient reports last Thursday he noticed reoccurrence of dark tarry stools consistent with previous GI bleed. He reports he's been taking his PPI as instructed. Due to this he presented to urgent care today. Patient reports he did not come until today due to Christmas holiday. Patient was noted to have systolic blood pressures in the 80s at urgent care and advised from the emergency department for further evaluation  On arrival here patient was hemodynamically stable and systolics were in the 425Z. Patient denied any abdominal pain or other complaints at this time. Guaiac was positive and patient had dark stool on examination. Patient without significant anemia on blood analysis. EKG without  new ischemia.  In setting of previous GI bleed requiring admission gastroenterologist consult. I spoke with [facility] who reports they will see patient in-patient tomorrow and do not believe patient requires urgent scope. Patient will be admitted to medicine team at this time for further  management of GI bleed. Patient stable at time of admission.  Attending is seen and evaluated patient Dr. Sabra Heck is in agreement with plan.    Esaw Grandchild, MD 12/23/15 [id number]  Noemi Chapel, MD 12/24/15 [phone]

## 2015-12-23 NOTE — ED Notes (Signed)
Internal medicine still bedside.

## 2015-12-23 NOTE — Progress Notes (Signed)
Received patient from ED. VS stable but has low temp at 96.6 oral.  Provided warm blanket and adjusted room thermostat higher.  Patient AOx4, ambulatory, oriented to room, bed controls and call light.

## 2015-12-24 DIAGNOSIS — K922 Gastrointestinal hemorrhage, unspecified: Secondary | ICD-10-CM | POA: Diagnosis not present

## 2015-12-24 LAB — BASIC METABOLIC PANEL
Anion gap: 6 (ref 5–15)
BUN: 41 mg/dL — AB (ref 6–20)
CO2: 30 mmol/L (ref 22–32)
Calcium: 8.9 mg/dL (ref 8.9–10.3)
Chloride: 101 mmol/L (ref 101–111)
Creatinine, Ser: 2.3 mg/dL — ABNORMAL HIGH (ref 0.61–1.24)
GFR calc non Af Amer: 26 mL/min — ABNORMAL LOW (ref >60)
GFR, EST AFRICAN AMERICAN: 30 mL/min — AB (ref >60)
Glucose, Bld: 368 mg/dL — ABNORMAL HIGH (ref 65–99)
POTASSIUM: 4.5 mmol/L (ref 3.5–5.1)
SODIUM: 137 mmol/L (ref 135–145)

## 2015-12-24 LAB — GLUCOSE, CAPILLARY
GLUCOSE-CAPILLARY: 361 mg/dL — AB (ref 65–99)
GLUCOSE-CAPILLARY: 198 mg/dL — AB (ref 65–99)
GLUCOSE-CAPILLARY: 212 mg/dL — AB (ref 65–99)
Glucose-Capillary: 313 mg/dL — ABNORMAL HIGH (ref 65–99)

## 2015-12-24 LAB — CBC
HEMATOCRIT: 32.1 % — AB (ref 39.0–52.0)
HEMOGLOBIN: 10.3 g/dL — AB (ref 13.0–17.0)
MCH: 31.9 pg (ref 26.0–34.0)
MCHC: 32.1 g/dL (ref 30.0–36.0)
MCV: 99.4 fL (ref 78.0–100.0)
Platelets: 203 10*3/uL (ref 150–400)
RBC: 3.23 MIL/uL — AB (ref 4.22–5.81)
RDW: 16.3 % — ABNORMAL HIGH (ref 11.5–15.5)
WBC: 3.9 10*3/uL — ABNORMAL LOW (ref 4.0–10.5)

## 2015-12-24 LAB — MAGNESIUM: MAGNESIUM: 1.4 mg/dL — AB (ref 1.7–2.4)

## 2015-12-24 MED ORDER — PANTOPRAZOLE SODIUM 40 MG IV SOLR
40.0000 mg | INTRAVENOUS | Status: DC
  Administered 2015-12-24 – 2015-12-25 (×2): 40 mg via INTRAVENOUS
  Filled 2015-12-24 (×2): qty 40

## 2015-12-24 MED ORDER — MAGNESIUM SULFATE 2 GM/50ML IV SOLN
2.0000 g | Freq: Once | INTRAVENOUS | Status: AC
  Administered 2015-12-24: 2 g via INTRAVENOUS
  Filled 2015-12-24: qty 50

## 2015-12-24 MED ORDER — LATANOPROST 0.005 % OP SOLN
1.0000 [drp] | Freq: Every day | OPHTHALMIC | Status: DC
  Administered 2015-12-24: 1 [drp] via OPHTHALMIC
  Filled 2015-12-24: qty 2.5

## 2015-12-24 MED ORDER — GABAPENTIN 300 MG PO CAPS
300.0000 mg | ORAL_CAPSULE | Freq: Two times a day (BID) | ORAL | Status: DC
  Administered 2015-12-24 – 2015-12-25 (×4): 300 mg via ORAL
  Filled 2015-12-24 (×4): qty 1

## 2015-12-24 MED ORDER — NYSTATIN 100000 UNIT/ML MT SUSP
5.0000 mL | Freq: Three times a day (TID) | OROMUCOSAL | Status: DC
  Administered 2015-12-24 – 2015-12-25 (×7): 500000 [IU] via ORAL
  Filled 2015-12-24 (×7): qty 5

## 2015-12-24 MED ORDER — ATORVASTATIN CALCIUM 20 MG PO TABS
20.0000 mg | ORAL_TABLET | Freq: Every day | ORAL | Status: DC
Start: 2015-12-24 — End: 2015-12-25
  Administered 2015-12-24 (×2): 20 mg via ORAL
  Filled 2015-12-24 (×2): qty 1

## 2015-12-24 MED ORDER — ALBUTEROL SULFATE (2.5 MG/3ML) 0.083% IN NEBU: 3.0000 mL | INHALATION_SOLUTION | Freq: Four times a day (QID) | RESPIRATORY_TRACT | Status: DC | PRN

## 2015-12-24 MED ORDER — TRAVOPROST 0.004 % OP SOLN: 1.0000 [drp] | Freq: Every day | OPHTHALMIC | Status: DC

## 2015-12-24 NOTE — Progress Notes (Signed)
Subjective: Patient seen and examined this morning.  No acute events overnight, has not had BM since admission.  Denies chest pain, shortness of breath, dizziness, lightheadedness.  No other complaints. Objective: Vital signs in last 24 hours: Filed Vitals:   12/23/15 2030 12/23/15 2205 12/23/15 2340 12/24/15 0438  BP: 109/73 132/76 106/57 134/72  Pulse: 45 99 97 93  Temp:  96.6 F (35.9 C)  97.6 F (36.4 C)  TempSrc:  Oral  Oral  Resp: '16 17  18  '$ Height:  6' (1.829 m)    Weight:  187 lb 6.3 oz (85 kg)    SpO2: 92% 97% 99% 96%   Weight change:   Intake/Output Summary (Last 24 hours) at 12/24/15 1359 Last data filed at 12/24/15 1325  Gross per 24 hour  Intake 956.25 ml  Output      0 ml  Net 956.25 ml   General: resting in bed, no distress HEENT: [place]/AT, EOMI, no scleral icterus Cardiac: irregular rhythm, no rubs, murmurs or gallops Pulm: clear to auscultation bilaterally, moving normal volumes of air Abd: soft, nontender, nondistended, BS present Ext: warm and well perfused, no pedal edema Neuro: alert and oriented X3, no focal deficits  Lab Results: Basic Metabolic Panel:  Recent Labs Lab 12/23/15 1818 12/24/15 0544  NA 134* 137  K 4.7 4.5  CL 99* 101  CO2 25 30  GLUCOSE 364* 368*  BUN 41* 41*  CREATININE 2.43* 2.30*  CALCIUM 9.3 8.9  MG  --  1.4*   Liver Function Tests:  Recent Labs Lab 12/23/15 1818  AST 20  ALT 11*  ALKPHOS 44  BILITOT 0.7  PROT 7.3  ALBUMIN 3.1*   No results for input(s): LIPASE, AMYLASE in the last 168 hours. No results for input(s): AMMONIA in the last 168 hours. CBC:  Recent Labs Lab 12/23/15 1818 12/24/15 0544  WBC 4.2 3.9*  HGB 11.0* 10.3*  HCT 34.4* 32.1*  MCV 100.3* 99.4  PLT 219 203   Cardiac Enzymes: No results for input(s): CKTOTAL, CKMB, CKMBINDEX, TROPONINI in the last 168 hours. BNP: No results for input(s): PROBNP in the last 168 hours. D-Dimer: No results for input(s): DDIMER in the last 168  hours. CBG:  Recent Labs Lab 12/23/15 2147 12/24/15 0730 12/24/15 1117  GLUCAP 287* 361* 313*   Hemoglobin A1C: No results for input(s): HGBA1C in the last 168 hours. Fasting Lipid Panel: No results for input(s): CHOL, HDL, LDLCALC, TRIG, CHOLHDL, LDLDIRECT in the last 168 hours. Thyroid Function Tests: No results for input(s): TSH, T4TOTAL, FREET4, T3FREE, THYROIDAB in the last 168 hours. Coagulation:  Recent Labs Lab 12/23/15 1818  LABPROT 16.9*  INR 1.36   Anemia Panel: No results for input(s): VITAMINB12, FOLATE, FERRITIN, TIBC, IRON, RETICCTPCT in the last 168 hours. Urine Drug Screen: Drugs of Abuse     Component Value Date/Time   LABOPIA NONE DETECTED 08/25/2014 1602   COCAINSCRNUR NONE DETECTED 08/25/2014 1602   LABBENZ NONE DETECTED 08/25/2014 1602   AMPHETMU NONE DETECTED 08/25/2014 1602   THCU NONE DETECTED 08/25/2014 1602   LABBARB NONE DETECTED 08/25/2014 1602    Alcohol Level: No results for input(s): ETH in the last 168 hours. Urinalysis: No results for input(s): COLORURINE, LABSPEC, PHURINE, GLUCOSEU, HGBUR, BILIRUBINUR, KETONESUR, PROTEINUR, UROBILINOGEN, NITRITE, LEUKOCYTESUR in the last 168 hours.  Invalid input(s): APPERANCEUR   Micro Results: No results found for this or any previous visit (from the past 240 hour(s)). Studies/Results: No results found. Medications:  Scheduled Meds: .  atorvastatin  20 mg Oral QHS  . gabapentin  300 mg Oral BID  . insulin aspart  0-5 Units Subcutaneous QHS  . insulin aspart  0-9 Units Subcutaneous TID WC  . insulin glargine  30 Units Subcutaneous Daily  . nystatin  5 mL Oral TID AC & HS  . pantoprazole (PROTONIX) IV  40 mg Intravenous Q24H  . sodium chloride  3 mL Intravenous Q12H   Continuous Infusions:  PRN Meds:. Assessment/Plan: Principal Problem:   GI bleed Active Problems:   DM (diabetes mellitus), type 2 with renal complications (HCC)   Atrial fibrillation (HCC)   Bleeding  gastrointestinal  Recurrent Melena: Patient with recurrence of melanotic stool with endoscopy on 12/2 without evidence for active bleeding, thought secondary to oozing from Eliquis use. Eliquis was continued and PPI was added.  -GI consulted, appreciate assistance.  Patient to go for EGD tomorrow with Dr. Paulita Fujita - NPO at midnight - HOLD Eliquis - Protonix '40mg'$  IV q24h - Hemoglobin 11 on admit, 10.3 this AM, continue to follow  Candidal Esophagitis - continue Nystatin oral suspension  Cough - Robitussin-DM  Atrial Fibrillation: Initially bradycardic on exam, now rate controlled <110. On Eliquis 5 mg BID and Carvedilol 12.5 mg BID at home. -Hold Eliquis -Hold Carvedilol.  If BP and HR remain stable, consider adding back at lower dose  DM Type 2: On Lantus 40 units every morning and Humulin 5-7 units twice daily before meals per sliding scale at home -SSI-Sensitive -Lantus 30 units every am -Blood sugars are elevated to 300s so would increase Lantus to home dose and resume pre-meal once patient resumes PO nutrition. - continue gabapentin  CHF: EF 40-45% - patient on Lasix, Lisinsopril and Coreg - holding secondary to hypotension and bradycardia.  Consider adding back low dose BB as needed for rate control as above.  CAD - continue atorvastatin  PVD - hold cilostazol '50mg'$  BID  Diet: clear liquid diet, NPO at midnight  Code: FULL  Dispo: Disposition is deferred at this time, awaiting improvement of current medical problems.  Anticipated discharge in approximately 2-3 day(s).   The patient does have a current PCP Jilda Panda, MD) and does not need an [facility] hospital follow-up appointment after discharge.  The patient does not have transportation limitations that hinder transportation to clinic appointments.  .Services Needed at time of discharge: Y = Yes, Blank = No PT:   OT:   RN:   Equipment:   Other:       Jule Ser, DO 12/24/2015, 1:59 PM

## 2015-12-24 NOTE — Consult Note (Signed)
[facility] Consultation Note  Referring Provider:  Dr. Zada Finders  Primary Care Physician:  Jilda Panda, MD Primary Gastroenterologist:  Dr. Penelope Coop  Reason for Consultation:  Melena, anemia  HPI: [name] is a 79 y.o. male admitted for anemia and melena.  Was in hospital three weeks ago with same presentation.  Is on chronic Eliquis for CAD and atrial fibrillation.  Had endoscopy earlier this month, showing mild candida esophagitis and mild antral erosions.  He was discharged home back on Eliquis with instructions to stay on PPI indefinitely.  About one week ago, he began having melena again.  No abdominal pain.  No hematemesis.  Reports drinking 1/2 gallon liquor per week recently, wonders if this could be playing role into his melena.  Last colonoscopy 3-4 years ago at [place], showed some polyps per patient, follow-up 3-5 years recommended per patient; I don't have this records for review.   Past Medical History  Diagnosis Date  . Hyperlipidemia   . HTN (hypertension)     echo 2/11: EF 50-55%, diast dyfxn, severe LVH, inf HK, LAE  . CAD (coronary artery disease)     a.  NSTEMI treated with PCI Feb 2011 with a DES.([facility]);   b. cath 2/11: D1 stents x 2 ok, AV groove CFX occluded with dist AV CFX filled by L-L collats, RCA occluded, OM2 90-95% (treated with PCI)  . MI (myocardial infarction) ([place])   . PVD (peripheral vascular disease) ([place])   . DM (diabetes mellitus) ([place])   . GERD (gastroesophageal reflux disease)   . Bipolar disorder ([place])   . Prostate cancer [facility])     status post radiation therapy in 2003  . Pulmonary edema   . Atrial fibrillation ([place])   . Respiratory difficulty 06/24/2014    intubated   . Hypertensive emergency 06/24/2014  . CKD (chronic kidney disease), stage III   . Anemia 11/28/2015  . Shortness of breath dyspnea   . Stroke [facility]) 07/2014    Past Surgical History  Procedure Laterality Date  . Femoral bypass  2003  .  Esophagogastroduodenoscopy N/A 11/29/2015    Procedure: ESOPHAGOGASTRODUODENOSCOPY (EGD);  Surgeon: Wonda Horner, MD;  Location[facility] ENDOSCOPY;  Service: Endoscopy;  Laterality: N/A;    Prior to Admission medications   Medication Sig Start Date End Date Taking? Authorizing Provider  acetaminophen (TYLENOL) 325 MG tablet Take 650 mg by mouth every 6 (six) hours as needed for fever (pain).   Yes Historical Provider, MD  albuterol (PROVENTIL HFA;VENTOLIN HFA) 108 (90 BASE) MCG/ACT inhaler Inhale 2 puffs into the lungs every 6 (six) hours as needed for shortness of breath.   Yes Historical Provider, MD  apixaban (ELIQUIS) 5 MG TABS tablet Take 1 tablet (5 mg total) by mouth 2 (two) times daily. Patient taking differently: Take 5 mg by mouth every 12 (twelve) hours.  08/28/14  Yes Thurnell Lose, MD  atorvastatin (LIPITOR) 40 MG tablet Take 1 tablet (40 mg total) by mouth daily. Patient taking differently: Take 20 mg by mouth at bedtime.  08/01/14  Yes Sherren Mocha, MD  carvedilol (COREG) 12.5 MG tablet Take 12.5 mg by mouth 2 (two) times daily with a meal.   Yes Historical Provider, MD  cholecalciferol (VITAMIN D) 1000 UNITS tablet Take 1,000 Units by mouth daily.   Yes Historical Provider, MD  cilostazol (PLETAL) 50 MG tablet Take 50 mg by mouth every 12 (twelve) hours.    Yes Historical Provider, MD  dextrose (GLUTOSE) 40 % GEL  Take 1 Tube by mouth once as needed for low blood sugar.   Yes Historical Provider, MD  diclofenac sodium (VOLTAREN) 1 % GEL Apply 2 g topically 4 (four) times daily. 11/29/15  Yes Alexa Sherral Hammers, MD  feeding supplement, GLUCERNA SHAKE, (GLUCERNA SHAKE) LIQD Take 237 mLs by mouth 3 (three) times daily between meals.   Yes Historical Provider, MD  finasteride (PROSCAR) 5 MG tablet Take 5 mg by mouth daily.     Yes Historical Provider, MD  furosemide (LASIX) 40 MG tablet Take 80 mg by mouth daily.    Yes Historical Provider, MD  gabapentin (NEURONTIN) 300 MG capsule Take 300  mg by mouth 2 (two) times daily.    Yes Historical Provider, MD  insulin glargine (LANTUS) 100 UNIT/ML injection Inject 40 Units into the skin daily. 07/20/14  Yes Geradine Girt, DO  insulin regular (HUMULIN R,NOVOLIN R) 100 units/mL injection Inject 5-7 Units into the skin 2 (two) times daily before a meal. Per sliding scale   Yes Historical Provider, MD  latanoprost (XALATAN) 0.005 % ophthalmic solution Place 1 drop into both eyes at bedtime.   Yes Historical Provider, MD  lisinopril (PRINIVIL,ZESTRIL) 20 MG tablet Take 10 mg by mouth daily.   Yes Historical Provider, MD  nystatin (MYCOSTATIN) 100000 UNIT/ML suspension Take 5 mLs (500,000 Units total) by mouth 4 (four) times daily. 11/29/15  Yes Alexa Sherral Hammers, MD  pantoprazole (PROTONIX) 40 MG tablet Take 1 tablet (40 mg total) by mouth daily. 11/29/15  Yes Alexa Sherral Hammers, MD  simethicone (MYLICON) 80 MG chewable tablet Chew 80 mg by mouth 3 (three) times daily as needed for flatulence. Take with meals as needed   Yes Historical Provider, MD  Tamsulosin HCl (FLOMAX) 0.4 MG CAPS Take 0.4 mg by mouth daily.     Yes Historical Provider, MD  travoprost, benzalkonium, (TRAVATAN) 0.004 % ophthalmic solution Place 1 drop into both eyes at bedtime.    Yes Historical Provider, MD  vitamin B-12 (CYANOCOBALAMIN) 1000 MCG tablet Take 1,000 mcg by mouth daily.   Yes Historical Provider, MD    Current Facility-Administered Medications  Medication Dose Route Frequency Provider Last Rate Last Dose  . atorvastatin (LIPITOR) tablet 20 mg  20 mg Oral QHS Juliet Rude, MD   20 mg at 12/24/15 0102  . gabapentin (NEURONTIN) capsule 300 mg  300 mg Oral BID Juliet Rude, MD   300 mg at 12/24/15 0102  . insulin aspart (novoLOG) injection 0-5 Units  0-5 Units Subcutaneous QHS Juliet Rude, MD   3 Units at 12/23/15 2213  . insulin aspart (novoLOG) injection 0-9 Units  0-9 Units Subcutaneous TID WC Juliet Rude, MD   9 Units at 12/24/15 0908  . insulin glargine  (LANTUS) injection 30 Units  30 Units Subcutaneous Daily Carly J Rivet, MD      . magnesium sulfate IVPB 2 g 50 mL  2 g Intravenous Once Juliet Rude, MD   2 g at 12/24/15 0908  . nystatin (MYCOSTATIN) 100000 UNIT/ML suspension 500,000 Units  5 mL Oral TID AC & HS Juliet Rude, MD   500,000 Units at 12/24/15 0907  . pantoprazole (PROTONIX) injection 40 mg  40 mg Intravenous Q24H Carly J Rivet, MD      . sodium chloride 0.9 % injection 3 mL  3 mL Intravenous Q12H Juliet Rude, MD   3 mL at 12/23/15 2125    Allergies as of 12/23/2015 - Review Complete  12/23/2015  Allergen Reaction Noted  . Penicillins Other (See Comments) 03/11/2010    Family History  Problem Relation Age of Onset  . Cancer Mother   . Cancer Father   . Cancer Sister     Social History   Social History  . Marital Status: Divorced    Spouse Name: N/A  . Number of Children: N/A  . Years of Education: N/A   Occupational History  . retired Architect    Social History Main Topics  . Smoking status: Current Every Day Smoker -- 0.50 packs/day for 50 years    Types: Cigarettes  . Smokeless tobacco: Never Used     Comment: He has a 50-pack-year hx of tobacco abuse. Currently, smoking about half a pack a day.  . Alcohol Use: Yes     Comment: Previously drank heavily, and now has a drink every other day or so.  . Drug Use: No  . Sexual Activity: Not on file   Other Topics Concern  . Not on file   Social History Narrative    Review of Systems: ROS Dr. Posey Pronto 12/23/15 reviewed and I agree  Physical Exam: Vital signs in last 24 hours: Temp:  [96.6 F (35.9 C)-98.7 F (37.1 C)] 97.6 F (36.4 C) (12/27 0438) Pulse Rate:  [45-105] 93 (12/27 0438) Resp:  [15-24] 18 (12/27 0438) BP: (77-134)/(55-76) 134/72 mmHg (12/27 0438) SpO2:  [91 %-99 %] 96 % (12/27 0438) Weight:  [85 kg (187 lb 6.3 oz)] 85 kg (187 lb 6.3 oz) (12/26 2205)   General:   Alert,  Cantankerous, younger-appearing than stated age, NAD Head:   Normocephalic and atraumatic. Eyes:  Sclera clear, no icterus.   Conjunctiva pink. Psych:  Alert and cooperative. Normal mood and affect.  Lab Results:  Recent Labs  12/23/15 1818 12/24/15 0544  WBC 4.2 3.9*  HGB 11.0* 10.3*  HCT 34.4* 32.1*  PLT 219 203   BMET  Recent Labs  12/23/15 1818 12/24/15 0544  NA 134* 137  K 4.7 4.5  CL 99* 101  CO2 25 30  GLUCOSE 364* 368*  BUN 41* 41*  CREATININE 2.43* 2.30*  CALCIUM 9.3 8.9   LFT  Recent Labs  12/23/15 1818  PROT 7.3  ALBUMIN 3.1*  AST 20  ALT 11*  ALKPHOS 44  BILITOT 0.7   PT/INR  Recent Labs  12/23/15 1818  LABPROT 16.9*  INR 1.36    Studies/Results: No results found.  Impression:  1.  Recurrent melena.  Endoscopy 3 weeks ago showed mild candida esophagitis and mild antral erosions. 2.  Mild anemia. 3.  Alcohol abuse. 4.  Chronic anticoagulation.  Plan:  1.  Patient would like consultation with Dr. Penelope Coop, since he did his prior endoscopy.  I counseled patient that we rotate hospital coverage and that I am the GI hospital physician this week. 2.  I have advised repeat endoscopy, as I am concerned the gastric erosions might have worsened in setting of recent increase in alcohol intake.  If endoscopy is unrevealing, I would consider colonoscopy. 3.  Patient is not sure what he wants to do, doesn't want to make a decision right now, and will let us know when he's decided what he wants to do.  I have given him options to either do endoscopic evaluation as I've recommended with me during this hospitalization or to follow-up with Dr. Penelope Coop expeditiously as an outpatient.  For the time-being, continue pantoprazole and hold Eliquis. 4.  Please call me back  once patient decides what he wants to do.     Landry Dyke  12/24/2015, 9:49 AM  Pager [phone] If no answer or after 5 PM call [phone]

## 2015-12-24 NOTE — Progress Notes (Addendum)
IM Intern Sherrye Payor made aware of the unconfirmed abnormal 12-lead EKG result of patient.  EKG ordered by attending MD due to GI bleed. Patient sleeping comfortably on bed.  Sherrye Payor called back and ordered for BMP, CBC & Magnesium level at 0500.  Will closely monitor.

## 2015-12-25 ENCOUNTER — Encounter (HOSPITAL_COMMUNITY)
Admission: EM | Disposition: A | Discharge status: Home / Self Care | Payer: Self-pay | Source: Home / Self Care | Attending: Emergency Medicine

## 2015-12-25 ENCOUNTER — Observation Stay (HOSPITAL_COMMUNITY): Payer: Medicare Other | Admitting: Anesthesiology

## 2015-12-25 ENCOUNTER — Encounter (HOSPITAL_COMMUNITY): Payer: Self-pay | Admitting: *Deleted

## 2015-12-25 DIAGNOSIS — I4891 Unspecified atrial fibrillation: Secondary | ICD-10-CM | POA: Diagnosis not present

## 2015-12-25 DIAGNOSIS — N183 Chronic kidney disease, stage 3 (moderate): Secondary | ICD-10-CM | POA: Diagnosis not present

## 2015-12-25 DIAGNOSIS — K922 Gastrointestinal hemorrhage, unspecified: Secondary | ICD-10-CM | POA: Diagnosis not present

## 2015-12-25 DIAGNOSIS — E1122 Type 2 diabetes mellitus with diabetic chronic kidney disease: Secondary | ICD-10-CM | POA: Diagnosis not present

## 2015-12-25 HISTORY — PX: ESOPHAGOGASTRODUODENOSCOPY (EGD) WITH PROPOFOL: SHX5813

## 2015-12-25 LAB — CBC
HCT: 33.7 % — ABNORMAL LOW (ref 39.0–52.0)
Hemoglobin: 10.7 g/dL — ABNORMAL LOW (ref 13.0–17.0)
MCH: 31.6 pg (ref 26.0–34.0)
MCHC: 31.8 g/dL (ref 30.0–36.0)
MCV: 99.4 fL (ref 78.0–100.0)
PLATELETS: 215 10*3/uL (ref 150–400)
RBC: 3.39 MIL/uL — ABNORMAL LOW (ref 4.22–5.81)
RDW: 16 % — AB (ref 11.5–15.5)
WBC: 4.7 10*3/uL (ref 4.0–10.5)

## 2015-12-25 LAB — GLUCOSE, CAPILLARY
Glucose-Capillary: 168 mg/dL — ABNORMAL HIGH (ref 65–99)
Glucose-Capillary: 147 mg/dL — ABNORMAL HIGH (ref 65–99)

## 2015-12-25 SURGERY — ESOPHAGOGASTRODUODENOSCOPY (EGD) WITH PROPOFOL
Anesthesia: Monitor Anesthesia Care | Laterality: Left

## 2015-12-25 MED ORDER — PROPOFOL 10 MG/ML IV BOLUS
INTRAVENOUS | Status: DC | PRN
  Administered 2015-12-25: 20 mg via INTRAVENOUS
  Administered 2015-12-25: 10 mg via INTRAVENOUS

## 2015-12-25 MED ORDER — SODIUM CHLORIDE 0.9 % IV SOLN
INTRAVENOUS | Status: DC
  Administered 2015-12-25: 09:00:00 via INTRAVENOUS

## 2015-12-25 MED ORDER — PROPOFOL 500 MG/50ML IV EMUL
INTRAVENOUS | Status: DC | PRN
  Administered 2015-12-25: 75 ug/kg/min via INTRAVENOUS

## 2015-12-25 MED ORDER — CARVEDILOL 12.5 MG PO TABS: 12.5000 mg | ORAL_TABLET | Freq: Two times a day (BID) | ORAL | Status: DC

## 2015-12-25 MED ORDER — LACTATED RINGERS IV SOLN: INTRAVENOUS | Status: DC

## 2015-12-25 NOTE — Anesthesia Preprocedure Evaluation (Addendum)
Anesthesia Evaluation  Patient identified by MRN, date of birth, ID band Patient awake    Reviewed: Allergy & Precautions, NPO status , Patient's Chart, lab work & pertinent test results  Airway Mallampati: I   Neck ROM: Full    Dental  (+) Edentulous Upper, Edentulous Lower   Pulmonary Current Smoker,    breath sounds clear to auscultation       Cardiovascular hypertension, Pt. on medications and Pt. on home beta blockers + CAD, + Past MI and +CHF   Rhythm:Regular Rate:Normal     Neuro/Psych Bipolar Disorder CVA    GI/Hepatic Neg liver ROS, GERD  ,  Endo/Other  diabetes, Type 2, Insulin Dependent  Renal/GU CRFRenal disease     Musculoskeletal   Abdominal   Peds  Hematology  (+) anemia ,   Anesthesia Other Findings   Reproductive/Obstetrics                           Anesthesia Physical Anesthesia Plan  ASA: III  Anesthesia Plan: MAC   Post-op Pain Management:    Induction: Intravenous  Airway Management Planned: Natural Airway and Simple Face Mask  Additional Equipment:   Intra-op Plan:   Post-operative Plan:   Informed Consent: I have reviewed the patients History and Physical, chart, labs and discussed the procedure including the risks, benefits and alternatives for the proposed anesthesia with the patient or authorized representative who has indicated his/her understanding and acceptance.     Plan Discussed with: CRNA  Anesthesia Plan Comments:         Anesthesia Quick Evaluation

## 2015-12-25 NOTE — Discharge Instructions (Signed)
Please schedule colonoscopy outpatient. Please HOLD the eliquis until you see your PCP Please follow up with your PCP

## 2015-12-25 NOTE — Anesthesia Postprocedure Evaluation (Signed)
Anesthesia Post Note  Patient: [name]  Procedure(s) Performed: Procedure(s) (LRB): ESOPHAGOGASTRODUODENOSCOPY (EGD) WITH PROPOFOL (Left)  Patient location during evaluation: PACU Anesthesia Type: MAC Level of consciousness: awake and alert Pain management: pain level controlled Vital Signs Assessment: post-procedure vital signs reviewed and stable Respiratory status: spontaneous breathing Cardiovascular status: blood pressure returned to baseline Anesthetic complications: no    Last Vitals:  Filed Vitals:   12/25/15 1030 12/25/15 1035  BP:  111/60  Pulse: 89 98  Temp:    Resp: 18 17    Last Pain:  Filed Vitals:   12/25/15 1039  PainSc: 0-No pain                 Tiajuana Amass

## 2015-12-25 NOTE — Transfer of Care (Signed)
Immediate Anesthesia Transfer of Care Note  Patient: [name]  Procedure(s) Performed: Procedure(s): ESOPHAGOGASTRODUODENOSCOPY (EGD) WITH PROPOFOL (Left)  Patient Location: Endoscopy Unit  Anesthesia Type:MAC  Level of Consciousness: awake and alert   Airway & Oxygen Therapy: Patient Spontanous Breathing and Patient connected to nasal cannula oxygen  Post-op Assessment: Report given to RN, Post -op Vital signs reviewed and stable and Patient moving all extremities  Post vital signs: Reviewed and stable  Last Vitals:  Filed Vitals:   12/25/15 0541 12/25/15 0905  BP: 159/90 166/81  Pulse: 100 92  Temp: 37.6 C 37.7 C  Resp: 18 16    Complications: No apparent anesthesia complications

## 2015-12-25 NOTE — Anesthesia Procedure Notes (Signed)
Procedure Name: MAC Date/Time: 12/25/2015 9:35 AM Performed by: Suzy Bouchard Pre-anesthesia Checklist: Emergency Drugs available, Patient identified, Suction available, Patient being monitored and Timeout performed Patient Re-evaluated:Patient Re-evaluated prior to inductionOxygen Delivery Method: Nasal cannula Preoxygenation: Pre-oxygenation with 100% oxygen Intubation Type: IV induction

## 2015-12-25 NOTE — Progress Notes (Signed)
Subjective: Patient seen and examined at bedside. No acute events overnight and no complaints He has been NPO and is willing to do the EGD by Dr Paulita Fujita   Objective: Vital signs in last 24 hours: Filed Vitals:   12/24/15 2116 12/25/15 0541 12/25/15 0904 12/25/15 0905  BP: 135/84 159/90  166/81  Pulse: 82 100  92  Temp: 99.4 F (37.4 C) 99.6 F (37.6 C)  99.9 F (37.7 C)  TempSrc: Oral Oral  Oral  Resp: '18 18  16  '$ Height:   6' (1.829 m)   Weight:   187 lb (84.823 kg)   SpO2: 96% 95%  95%   Weight change:   Intake/Output Summary (Last 24 hours) at 12/25/15 0952 Last data filed at 12/25/15 0545  Gross per 24 hour  Intake    360 ml  Output    950 ml  Net   -590 ml   General: resting in bed, no distress Cardiac: regular rhythm, no rubs, murmurs or gallops Pulm: clear to auscultation bilaterally, moving normal volumes of air Abd: soft, nontender, nondistended, BS present Ext: warm and well perfused, no pedal edema Neuro: alert and oriented X3, no focal deficits  Lab Results: Basic Metabolic Panel:  Recent Labs Lab 12/23/15 1818 12/24/15 0544  NA 134* 137  K 4.7 4.5  CL 99* 101  CO2 25 30  GLUCOSE 364* 368*  BUN 41* 41*  CREATININE 2.43* 2.30*  CALCIUM 9.3 8.9  MG  --  1.4*   Liver Function Tests:  Recent Labs Lab 12/23/15 1818  AST 20  ALT 11*  ALKPHOS 44  BILITOT 0.7  PROT 7.3  ALBUMIN 3.1*   No results for input(s): LIPASE, AMYLASE in the last 168 hours. No results for input(s): AMMONIA in the last 168 hours. CBC:  Recent Labs Lab 12/24/15 0544 12/25/15 0629  WBC 3.9* 4.7  HGB 10.3* 10.7*  HCT 32.1* 33.7*  MCV 99.4 99.4  PLT 203 215   Cardiac Enzymes: No results for input(s): CKTOTAL, CKMB, CKMBINDEX, TROPONINI in the last 168 hours. BNP: No results for input(s): PROBNP in the last 168 hours. D-Dimer: No results for input(s): DDIMER in the last 168 hours. CBG:  Recent Labs Lab 12/23/15 2147 12/24/15 0730 12/24/15 1117  12/24/15 1701 12/24/15 2116 12/25/15 0731  GLUCAP 287* 361* 313* 198* 212* 168*   Hemoglobin A1C: No results for input(s): HGBA1C in the last 168 hours. Fasting Lipid Panel: No results for input(s): CHOL, HDL, LDLCALC, TRIG, CHOLHDL, LDLDIRECT in the last 168 hours. Thyroid Function Tests: No results for input(s): TSH, T4TOTAL, FREET4, T3FREE, THYROIDAB in the last 168 hours. Coagulation:  Recent Labs Lab 12/23/15 1818  LABPROT 16.9*  INR 1.36   Anemia Panel: No results for input(s): VITAMINB12, FOLATE, FERRITIN, TIBC, IRON, RETICCTPCT in the last 168 hours. Urine Drug Screen: Drugs of Abuse     Component Value Date/Time   LABOPIA NONE DETECTED 08/25/2014 1602   COCAINSCRNUR NONE DETECTED 08/25/2014 1602   LABBENZ NONE DETECTED 08/25/2014 1602   AMPHETMU NONE DETECTED 08/25/2014 1602   THCU NONE DETECTED 08/25/2014 1602   LABBARB NONE DETECTED 08/25/2014 1602    Alcohol Level: No results for input(s): ETH in the last 168 hours. Urinalysis: No results for input(s): COLORURINE, LABSPEC, PHURINE, GLUCOSEU, HGBUR, BILIRUBINUR, KETONESUR, PROTEINUR, UROBILINOGEN, NITRITE, LEUKOCYTESUR in the last 168 hours.  Invalid input(s): APPERANCEUR   Micro Results: No results found for this or any previous visit (from the past 240 hour(s)). Studies/Results: No results  found. Medications:  Scheduled Meds: . [MAR Hold] atorvastatin  20 mg Oral QHS  . [MAR Hold] gabapentin  300 mg Oral BID  . [MAR Hold] insulin aspart  0-5 Units Subcutaneous QHS  . [MAR Hold] insulin aspart  0-9 Units Subcutaneous TID WC  . [MAR Hold] insulin glargine  30 Units Subcutaneous Daily  . [MAR Hold] latanoprost  1 drop Both Eyes QHS  . [MAR Hold] nystatin  5 mL Oral TID AC & HS  . [MAR Hold] pantoprazole (PROTONIX) IV  40 mg Intravenous Q24H  . [MAR Hold] sodium chloride  3 mL Intravenous Q12H   Continuous Infusions: . sodium chloride    . lactated ringers     PRN  Meds:. Assessment/Plan: Principal Problem:   GI bleed Active Problems:   DM (diabetes mellitus), type 2 with renal complications (HCC)   Atrial fibrillation (HCC)   Bleeding gastrointestinal  Recurrent Melena: Patient with recurrence of melanotic stool with endoscopy on 12/2 without evidence for active bleeding, thought secondary to oozing from Eliquis use. Eliquis was continued and PPI was added at that time.  His hemoglobin has been stable and today it was 10.7 and on admission it was 11. -GI consulted, appreciate assistance.  Patient is NPo and is in the EGD right now with Dr Paulita Fujita - HOLD Eliquis, will most likely hold the eliquis at discharge and pt will discuss the question of continuing to use the eliquis with pcp. - Protonix '40mg'$  IV q24h  Candidal Esophagitis - continue Nystatin oral suspension  Cough - Robitussin-DM  Atrial Fibrillation: Initially bradycardic on exam, now rate controlled <110. On Eliquis 5 mg BID and Carvedilol 12.5 mg BID at home. -Hold Eliquis -Hold Carvedilol.  If BP and HR remain stable, consider adding back at lower dose  DM Type 2: On Lantus 40 units every morning and Humulin 5-7 units twice daily before meals per sliding scale at home His blood sugars are better controlled. -SSI-Sensitive -Lantus 30 units every am - continue gabapentin  CHF: EF 40-45% - patient on Lasix, Lisinsopril and Coreg - holding secondary to hypotension and bradycardia.  Consider adding back low dose BB as needed for rate control as above.  CAD - continue atorvastatin  PVD - hold cilostazol '50mg'$  BID  Diet: resume diet after EGD   Code: FULL  Dispo: Disposition is deferred at this time, awaiting improvement of current medical problems.  Anticipated discharge in approximately 2-3 day(s).   The patient does have a current PCP Jilda Panda, MD) and does not need an [facility] hospital follow-up appointment after discharge.  The patient does not have transportation  limitations that hinder transportation to clinic appointments.  .Services Needed at time of discharge: Y = Yes, Blank = No PT:   OT:   RN:   Equipment:   Other:       Burgess Estelle, MD 12/25/2015, 9:52 AM

## 2015-12-25 NOTE — H&P (View-Only) (Signed)
[facility] Consultation Note  Referring Provider:  Dr. Zada Finders  Primary Care Physician:  Jilda Panda, MD Primary Gastroenterologist:  Dr. Penelope Coop  Reason for Consultation:  Melena, anemia  HPI: [name] is a 79 y.o. male admitted for anemia and melena.  Was in hospital three weeks ago with same presentation.  Is on chronic Eliquis for CAD and atrial fibrillation.  Had endoscopy earlier this month, showing mild candida esophagitis and mild antral erosions.  He was discharged home back on Eliquis with instructions to stay on PPI indefinitely.  About one week ago, he began having melena again.  No abdominal pain.  No hematemesis.  Reports drinking 1/2 gallon liquor per week recently, wonders if this could be playing role into his melena.  Last colonoscopy 3-4 years ago at [place], showed some polyps per patient, follow-up 3-5 years recommended per patient; I don't have this records for review.   Past Medical History  Diagnosis Date  . Hyperlipidemia   . HTN (hypertension)     echo 2/11: EF 50-55%, diast dyfxn, severe LVH, inf HK, LAE  . CAD (coronary artery disease)     a.  NSTEMI treated with PCI Feb 2011 with a DES.([facility]);   b. cath 2/11: D1 stents x 2 ok, AV groove CFX occluded with dist AV CFX filled by L-L collats, RCA occluded, OM2 90-95% (treated with PCI)  . MI (myocardial infarction) ([place])   . PVD (peripheral vascular disease) ([place])   . DM (diabetes mellitus) ([place])   . GERD (gastroesophageal reflux disease)   . Bipolar disorder ([place])   . Prostate cancer [facility])     status post radiation therapy in 2003  . Pulmonary edema   . Atrial fibrillation ([place])   . Respiratory difficulty 06/24/2014    intubated   . Hypertensive emergency 06/24/2014  . CKD (chronic kidney disease), stage III   . Anemia 11/28/2015  . Shortness of breath dyspnea   . Stroke [facility]) 07/2014    Past Surgical History  Procedure Laterality Date  . Femoral bypass  2003  .  Esophagogastroduodenoscopy N/A 11/29/2015    Procedure: ESOPHAGOGASTRODUODENOSCOPY (EGD);  Surgeon: Wonda Horner, MD;  Location: [facility] ENDOSCOPY;  Service: Endoscopy;  Laterality: N/A;    Prior to Admission medications   Medication Sig Start Date End Date Taking? Authorizing Provider  acetaminophen (TYLENOL) 325 MG tablet Take 650 mg by mouth every 6 (six) hours as needed for fever (pain).   Yes Historical Provider, MD  albuterol (PROVENTIL HFA;VENTOLIN HFA) 108 (90 BASE) MCG/ACT inhaler Inhale 2 puffs into the lungs every 6 (six) hours as needed for shortness of breath.   Yes Historical Provider, MD  apixaban (ELIQUIS) 5 MG TABS tablet Take 1 tablet (5 mg total) by mouth 2 (two) times daily. Patient taking differently: Take 5 mg by mouth every 12 (twelve) hours.  08/28/14  Yes Thurnell Lose, MD  atorvastatin (LIPITOR) 40 MG tablet Take 1 tablet (40 mg total) by mouth daily. Patient taking differently: Take 20 mg by mouth at bedtime.  08/01/14  Yes Sherren Mocha, MD  carvedilol (COREG) 12.5 MG tablet Take 12.5 mg by mouth 2 (two) times daily with a meal.   Yes Historical Provider, MD  cholecalciferol (VITAMIN D) 1000 UNITS tablet Take 1,000 Units by mouth daily.   Yes Historical Provider, MD  cilostazol (PLETAL) 50 MG tablet Take 50 mg by mouth every 12 (twelve) hours.    Yes Historical Provider, MD  dextrose (GLUTOSE) 40 % GEL  Take 1 Tube by mouth once as needed for low blood sugar.   Yes Historical Provider, MD  diclofenac sodium (VOLTAREN) 1 % GEL Apply 2 g topically 4 (four) times daily. 11/29/15  Yes Alexa Sherral Hammers, MD  feeding supplement, GLUCERNA SHAKE, (GLUCERNA SHAKE) LIQD Take 237 mLs by mouth 3 (three) times daily between meals.   Yes Historical Provider, MD  finasteride (PROSCAR) 5 MG tablet Take 5 mg by mouth daily.     Yes Historical Provider, MD  furosemide (LASIX) 40 MG tablet Take 80 mg by mouth daily.    Yes Historical Provider, MD  gabapentin (NEURONTIN) 300 MG capsule Take 300  mg by mouth 2 (two) times daily.    Yes Historical Provider, MD  insulin glargine (LANTUS) 100 UNIT/ML injection Inject 40 Units into the skin daily. 07/20/14  Yes Geradine Girt, DO  insulin regular (HUMULIN R,NOVOLIN R) 100 units/mL injection Inject 5-7 Units into the skin 2 (two) times daily before a meal. Per sliding scale   Yes Historical Provider, MD  latanoprost (XALATAN) 0.005 % ophthalmic solution Place 1 drop into both eyes at bedtime.   Yes Historical Provider, MD  lisinopril (PRINIVIL,ZESTRIL) 20 MG tablet Take 10 mg by mouth daily.   Yes Historical Provider, MD  nystatin (MYCOSTATIN) 100000 UNIT/ML suspension Take 5 mLs (500,000 Units total) by mouth 4 (four) times daily. 11/29/15  Yes Alexa Sherral Hammers, MD  pantoprazole (PROTONIX) 40 MG tablet Take 1 tablet (40 mg total) by mouth daily. 11/29/15  Yes Alexa Sherral Hammers, MD  simethicone (MYLICON) 80 MG chewable tablet Chew 80 mg by mouth 3 (three) times daily as needed for flatulence. Take with meals as needed   Yes Historical Provider, MD  Tamsulosin HCl (FLOMAX) 0.4 MG CAPS Take 0.4 mg by mouth daily.     Yes Historical Provider, MD  travoprost, benzalkonium, (TRAVATAN) 0.004 % ophthalmic solution Place 1 drop into both eyes at bedtime.    Yes Historical Provider, MD  vitamin B-12 (CYANOCOBALAMIN) 1000 MCG tablet Take 1,000 mcg by mouth daily.   Yes Historical Provider, MD    Current Facility-Administered Medications  Medication Dose Route Frequency Provider Last Rate Last Dose  . atorvastatin (LIPITOR) tablet 20 mg  20 mg Oral QHS Juliet Rude, MD   20 mg at 12/24/15 0102  . gabapentin (NEURONTIN) capsule 300 mg  300 mg Oral BID Juliet Rude, MD   300 mg at 12/24/15 0102  . insulin aspart (novoLOG) injection 0-5 Units  0-5 Units Subcutaneous QHS Juliet Rude, MD   3 Units at 12/23/15 2213  . insulin aspart (novoLOG) injection 0-9 Units  0-9 Units Subcutaneous TID WC Juliet Rude, MD   9 Units at 12/24/15 0908  . insulin glargine  (LANTUS) injection 30 Units  30 Units Subcutaneous Daily Carly J Rivet, MD      . magnesium sulfate IVPB 2 g 50 mL  2 g Intravenous Once Juliet Rude, MD   2 g at 12/24/15 0908  . nystatin (MYCOSTATIN) 100000 UNIT/ML suspension 500,000 Units  5 mL Oral TID AC & HS Juliet Rude, MD   500,000 Units at 12/24/15 0907  . pantoprazole (PROTONIX) injection 40 mg  40 mg Intravenous Q24H Carly J Rivet, MD      . sodium chloride 0.9 % injection 3 mL  3 mL Intravenous Q12H Juliet Rude, MD   3 mL at 12/23/15 2125    Allergies as of 12/23/2015 - Review Complete  12/23/2015  Allergen Reaction Noted  . Penicillins Other (See Comments) 03/11/2010    Family History  Problem Relation Age of Onset  . Cancer Mother   . Cancer Father   . Cancer Sister     Social History   Social History  . Marital Status: Divorced    Spouse Name: N/A  . Number of Children: N/A  . Years of Education: N/A   Occupational History  . retired Architect    Social History Main Topics  . Smoking status: Current Every Day Smoker -- 0.50 packs/day for 50 years    Types: Cigarettes  . Smokeless tobacco: Never Used     Comment: He has a 50-pack-year hx of tobacco abuse. Currently, smoking about half a pack a day.  . Alcohol Use: Yes     Comment: Previously drank heavily, and now has a drink every other day or so.  . Drug Use: No  . Sexual Activity: Not on file   Other Topics Concern  . Not on file   Social History Narrative    Review of Systems: ROS Dr. Posey Pronto 12/23/15 reviewed and I agree  Physical Exam: Vital signs in last 24 hours: Temp:  [96.6 F (35.9 C)-98.7 F (37.1 C)] 97.6 F (36.4 C) (12/27 0438) Pulse Rate:  [45-105] 93 (12/27 0438) Resp:  [15-24] 18 (12/27 0438) BP: (77-134)/(55-76) 134/72 mmHg (12/27 0438) SpO2:  [91 %-99 %] 96 % (12/27 0438) Weight:  [85 kg (187 lb 6.3 oz)] 85 kg (187 lb 6.3 oz) (12/26 2205)   General:   Alert,  Cantankerous, younger-appearing than stated age, NAD Head:   Normocephalic and atraumatic. Eyes:  Sclera clear, no icterus.   Conjunctiva pink. Psych:  Alert and cooperative. Normal mood and affect.  Lab Results:  Recent Labs  12/23/15 1818 12/24/15 0544  WBC 4.2 3.9*  HGB 11.0* 10.3*  HCT 34.4* 32.1*  PLT 219 203   BMET  Recent Labs  12/23/15 1818 12/24/15 0544  NA 134* 137  K 4.7 4.5  CL 99* 101  CO2 25 30  GLUCOSE 364* 368*  BUN 41* 41*  CREATININE 2.43* 2.30*  CALCIUM 9.3 8.9   LFT  Recent Labs  12/23/15 1818  PROT 7.3  ALBUMIN 3.1*  AST 20  ALT 11*  ALKPHOS 44  BILITOT 0.7   PT/INR  Recent Labs  12/23/15 1818  LABPROT 16.9*  INR 1.36    Studies/Results: No results found.  Impression:  1.  Recurrent melena.  Endoscopy 3 weeks ago showed mild candida esophagitis and mild antral erosions. 2.  Mild anemia. 3.  Alcohol abuse. 4.  Chronic anticoagulation.  Plan:  1.  Patient would like consultation with Dr. Penelope Coop, since he did his prior endoscopy.  I counseled patient that we rotate hospital coverage and that I am the GI hospital physician this week. 2.  I have advised repeat endoscopy, as I am concerned the gastric erosions might have worsened in setting of recent increase in alcohol intake.  If endoscopy is unrevealing, I would consider colonoscopy. 3.  Patient is not sure what he wants to do, doesn't want to make a decision right now, and will let us know when he's decided what he wants to do.  I have given him options to either do endoscopic evaluation as I've recommended with me during this hospitalization or to follow-up with Dr. Penelope Coop expeditiously as an outpatient.  For the time-being, continue pantoprazole and hold Eliquis. 4.  Please call me back  once patient decides what he wants to do.     Landry Dyke  12/24/2015, 9:49 AM  Pager [phone] If no answer or after 5 PM call [phone]

## 2015-12-25 NOTE — Progress Notes (Signed)
Late Note:  Patient called to the department about 250p to state he had left his paperwork on the bedside table.  The charge nurse on the department had it safe at the nursing station.  Discharging RN had reviewed information with patient prior to him being released. Leadership asked patient if he would be able to come back to the unit to pick it up.  The patient stated he would not be able to come but gave his address and asked if we could mail it.  Contacted Risk management and placed in the mail.

## 2015-12-25 NOTE — Progress Notes (Signed)
AVS given to patient. IV removed. Understanding demonstrated. Belongings packed. Transportation arranged by patient.

## 2015-12-25 NOTE — Op Note (Signed)
[facility] [place], [id number]   ENDOSCOPY PROCEDURE REPORT  PATIENT: [name], [name]  MR#: [id number] BIRTHDATE: 08/24/1936 , 79  yrs. old GENDER: male ENDOSCOPIST: Arta Silence, MD REFERRED BY:  Internal Medicine Teaching Service, M.D. PROCEDURE DATE:  Jan 19, 2016 PROCEDURE:  EGD, diagnostic ASA CLASS:     Class III INDICATIONS:  melena, anemia. MEDICATIONS: Monitored anesthesia care TOPICAL ANESTHETIC:  DESCRIPTION OF PROCEDURE: After the risks benefits and alternatives of the procedure were thoroughly explained, informed consent was obtained.  The diagnostic forward-viewing endoscope was introduced through the mouth and advanced to the second portion of the duodenum. The instrument was slowly withdrawn as the mucosa was fully examined. Estimated blood loss is zero unless otherwise noted in this procedure report.    Findings:  Small hiatal hernia.  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  Solitary small non-bleeding AVM in the proximal duodenum.  No old or fresh blood identified to the extent of our examination.  No ulcer or gastric erosions were identified. Retroflexion into the cardia was normal.              The scope was then withdrawn from the patient and the procedure completed.  COMPLICATIONS: There were no immediate complications.  ENDOSCOPIC IMPRESSION:     As above.  No source to explain recurrent anemia or melena.  RECOMMENDATIONS:     1.  Watch for potential complications of procedure. 2.  Would pursue colonoscopy as next step in management; patient unsure if he wants this done, will await his decision before scheduling. 3.  If colonoscopy is done and is unrevealing, and melena/anemia recurs, capsule endoscopy would be next step in management. 4.  Keep patient on clear liquids for now, until he makes up his mind about doing colonoscopy. 5.  [facility] will follow.  eSigned:  Arta Silence, MD  01-19-16 9:51 AM   CC:  CPT CODES: ICD CODES:  The ICD and CPT codes recommended by this software are interpretations from the data that the clinical staff has captured with the software.  The verification of the translation of this report to the ICD and CPT codes and modifiers is the sole responsibility of the health care institution and practicing physician where this report was generated.  [employer] will not be held responsible for the validity of the ICD and CPT codes included on this report.  AMA assumes no liability for data contained or not contained herein. CPT is a Designer, television/film set of the [employer].

## 2015-12-25 NOTE — Interval H&P Note (Signed)
History and Physical Interval Note:  12/25/2015 9:24 AM  [name]  has presented today for surgery, with the diagnosis of Anemia, melena  The various methods of treatment have been discussed with the patient and family. After consideration of risks, benefits and other options for treatment, the patient has consented to  Procedure(s): ESOPHAGOGASTRODUODENOSCOPY (EGD) WITH PROPOFOL (Left) as a surgical intervention .  The patient's history has been reviewed, patient examined, no change in status, stable for surgery.  I have reviewed the patient's chart and labs.  Questions were answered to the patient's satisfaction.     [name] M  Assessment:  1.  Melena. 2.  Anemia.  Plan:  1.  Endoscopy. 2.  Risks (bleeding, infection, bowel perforation that could require surgery, sedation-related changes in cardiopulmonary systems), benefits (identification and possible treatment of source of symptoms, exclusion of certain causes of symptoms), and alternatives (watchful waiting, radiographic imaging studies, empiric medical treatment) of upper endoscopy (EGD) were explained to patient/family in detail and patient wishes to proceed.

## 2015-12-25 NOTE — Discharge Summary (Signed)
Name: [name] MRN: [id number] DOB: 12/26/1936 79 y.o. PCP: Jilda Panda, MD  Date of Admission: 12/23/2015  6:18 PM Date of Discharge: 12/25/2015 Attending Physician: Aldine Contes, MD  Discharge Diagnosis: 1. Recurrent GI bleed,source not found  Principal Problem:   GI bleed Active Problems:   DM (diabetes mellitus), type 2 with renal complications (HCC)   Atrial fibrillation (HCC)   Bleeding gastrointestinal  Discharge Medications:   Medication List    STOP taking these medications        apixaban 5 MG Tabs tablet  Commonly known as:  ELIQUIS      TAKE these medications        acetaminophen 325 MG tablet  Commonly known as:  TYLENOL  Take 650 mg by mouth every 6 (six) hours as needed for fever (pain).     albuterol 108 (90 Base) MCG/ACT inhaler  Commonly known as:  PROVENTIL HFA;VENTOLIN HFA  Inhale 2 puffs into the lungs every 6 (six) hours as needed for shortness of breath.     atorvastatin 40 MG tablet  Commonly known as:  LIPITOR  Take 1 tablet (40 mg total) by mouth daily.     carvedilol 12.5 MG tablet  Commonly known as:  COREG  Take 12.5 mg by mouth 2 (two) times daily with a meal.     cholecalciferol 1000 units tablet  Commonly known as:  VITAMIN D  Take 1,000 Units by mouth daily.     cilostazol 50 MG tablet  Commonly known as:  PLETAL  Take 50 mg by mouth every 12 (twelve) hours.     dextrose 40 % Gel  Commonly known as:  GLUTOSE  Take 1 Tube by mouth once as needed for low blood sugar.     diclofenac sodium 1 % Gel  Commonly known as:  VOLTAREN  Apply 2 g topically 4 (four) times daily.     feeding supplement (GLUCERNA SHAKE) Liqd  Take 237 mLs by mouth 3 (three) times daily between meals.     finasteride 5 MG tablet  Commonly known as:  PROSCAR  Take 5 mg by mouth daily.     furosemide 40 MG tablet  Commonly known as:  LASIX  Take 80 mg by mouth daily.     gabapentin 300 MG capsule  Commonly known as:  NEURONTIN    Take 300 mg by mouth 2 (two) times daily.     insulin glargine 100 UNIT/ML injection  Commonly known as:  LANTUS  Inject 40 Units into the skin daily.     insulin regular 100 units/mL injection  Commonly known as:  NOVOLIN R,HUMULIN R  Inject 5-7 Units into the skin 2 (two) times daily before a meal. Per sliding scale     latanoprost 0.005 % ophthalmic solution  Commonly known as:  XALATAN  Place 1 drop into both eyes at bedtime.     lisinopril 20 MG tablet  Commonly known as:  PRINIVIL,ZESTRIL  Take 10 mg by mouth daily.     nystatin 100000 UNIT/ML suspension  Commonly known as:  MYCOSTATIN  Take 5 mLs (500,000 Units total) by mouth 4 (four) times daily.     pantoprazole 40 MG tablet  Commonly known as:  PROTONIX  Take 1 tablet (40 mg total) by mouth daily.     simethicone 80 MG chewable tablet  Commonly known as:  MYLICON  Chew 80 mg by mouth 3 (three) times daily as needed for flatulence. Take with meals as  needed     tamsulosin 0.4 MG Caps capsule  Commonly known as:  FLOMAX  Take 0.4 mg by mouth daily.     travoprost (benzalkonium) 0.004 % ophthalmic solution  Commonly known as:  TRAVATAN  Place 1 drop into both eyes at bedtime.     vitamin B-12 1000 MCG tablet  Commonly known as:  CYANOCOBALAMIN  Take 1,000 mcg by mouth daily.        Disposition and follow-up:   JohnIdus H Alfieri was discharged from [facility] in Good condition.  At the hospital follow up visit please address:  1.  Recurrent melena: eliquis was held at discharge- decision needs to be made on whether to continue it. A repeat EGD was done and a source was not found. He is recommended to do an outpatient colonoscopy. His hemoglobin was stable. Please assess if the patient has any further episodes of melena/hematochezia.  2.  Labs / imaging needed at time of follow-up: CBC, colonoscopy   3.  Pending labs/ test needing follow-up:   Follow-up Appointments:      Follow-up Information    Follow up with Jilda Panda, MD. Schedule an appointment as soon as possible for a visit in 1 week.   Specialty:  Internal Medicine   Contact information:   [address] [place] [address] [phone]       Discharge Instructions: Discharge Instructions    Diet - low sodium heart healthy    Complete by:  As directed      Increase activity slowly    Complete by:  As directed            Consultations:    Procedures Performed: egd Dg Chest 2 View  11/28/2015  CLINICAL DATA:  Shortness of Breath EXAM: CHEST  2 VIEW COMPARISON:  October 20, 2014 FINDINGS: There is fibrotic change in the bases. There is a minimal pleural effusion on the right. The lungs elsewhere are clear. Heart is borderline enlarged with pulmonary vascularity within normal limits. No adenopathy. There is atherosclerotic calcification in aorta. No bone lesions. IMPRESSION: No edema or consolidation. Minimal right pleural effusion with fibrotic type change in the bases. Borderline cardiomegaly. There may be a degree of mild underlying congestive heart failure given borderline cardiac enlargement and small right effusion. Electronically Signed   By: Lowella Grip III M.D.   On: 11/28/2015 13:17    2D Echo:   Cardiac Cath:   Admission HPI:   [name] is a 79 year old man with PMH of atrial fibrillation on Eliquis (CHADSVASC 8), CVA, CHF (EF 40-45% 07/2014), CAD, CKD stage 3, HTN, PVD, and reported lung cancer (s/p radiation at [place]) who presents with dark black stool. Patient was previously admitted from 12/1-12/2 for melanotic stool. EGD at the time showed few scattered erosions in the prepyloric antrum of the stomach, early candidal esophagitis, but no evidence for active bleeding. His melena was attributed to oozing in the setting of Eliquis therapy. GI recommended PPI therapy with Protonix and continuing Eliquis on discharge and nystatin oral suspension. His Hgb during that  admission was stable at 9 or above. Patient reports that he was doing well after discharge, completed Nystatin treatment and adherent to Protonix. He says his stools were normal until last Thursday when he noticed reoccurrence of dark black stools similar to his prior GI bleed. He went to urgent care and was noted to have systolic BP in the 60A and advised for him to come  to the ED for further evaluation. Patient reports associated symptoms of fatigue, but no lightheadedness, dizziness, syncope, fall, BRBPR, hematemesis, hemoptysis, epistaxis, chest pain, SOB, N/V/D/C, or abdominal pain. He reports Advil use once a month. He reports having a colonoscopy several years ago and says they found a polyp and is to have repeat colonoscopy 5 years after last.  ED vitals: BP 117/67 mmHg  Pulse 86  Temp(Src) 98.7 F (37.1 C) (Oral)  Resp 24  SpO2 92%. FOBT positive, Hgb 11.0, PT/INR 16.9/1.36.  Hospital Course by problem list: Principal Problem:   GI bleed Active Problems:   DM (diabetes mellitus), type 2 with renal complications (HCC)   Atrial fibrillation (HCC)   Bleeding gastrointestinal    Recurrent melena: Had an endoscopy on 12/2 without any evidence of active bleeding. He had recurrence of melanotic stool since then, thought to be secondary to continued eliquis use as he was on Eliquis and PPI. Eliquis was held. GI was consulted who did a repeat EGD. A source of bleeding was not found, and they recommended that if there is a recurrence then capsule endoscopy would be the next step. They also recommended to have outpatient colonoscopy.His hemoglobin remained stable. Eliquis was held on discharge until he sees his PCP. He is asked to make an appointment to his PCP.  Atrial Fibrillation: Initially bradycardic on exam, then rate controlled <110. On Eliquis 5 mg BID and Carvedilol 12.5 mg BID at home. Eliquis was held at discharge until he follows up with PCP. Coreg was resumed on discharge.   DM Type  2: Blood sugars remained stable.  CHF: EF 40-45%.  Patient on home Lasix, Lisinsopril and Coreg. Initially his home meds were held secondary to hypotension and bradycardia. They were resumed on discharge.  Discharge Vitals:   BP 111/60 mmHg  Pulse 98  Temp(Src) 99.9 F (37.7 C) (Oral)  Resp 17  Ht 6' (1.829 m)  Wt 187 lb (84.823 kg)  BMI 25.36 kg/m2  SpO2 97%  Discharge Labs:  Results for orders placed or performed during the hospital encounter of 12/23/15 (from the past 24 hour(s))  Glucose, capillary     Status: Abnormal   Collection Time: 12/24/15  5:01 PM  Result Value Ref Range   Glucose-Capillary 198 (H) 65 - 99 mg/dL  Glucose, capillary     Status: Abnormal   Collection Time: 12/24/15  9:16 PM  Result Value Ref Range   Glucose-Capillary 212 (H) 65 - 99 mg/dL   Comment 1 Notify RN   CBC     Status: Abnormal   Collection Time: 12/25/15  6:29 AM  Result Value Ref Range   WBC 4.7 4.0 - 10.5 K/uL   RBC 3.39 (L) 4.22 - 5.81 MIL/uL   Hemoglobin 10.7 (L) 13.0 - 17.0 g/dL   HCT 33.7 (L) 39.0 - 52.0 %   MCV 99.4 78.0 - 100.0 fL   MCH 31.6 26.0 - 34.0 pg   MCHC 31.8 30.0 - 36.0 g/dL   RDW 16.0 (H) 11.5 - 15.5 %   Platelets 215 150 - 400 K/uL  Glucose, capillary     Status: Abnormal   Collection Time: 12/25/15  7:31 AM  Result Value Ref Range   Glucose-Capillary 168 (H) 65 - 99 mg/dL  Glucose, capillary     Status: Abnormal   Collection Time: 12/25/15 11:31 AM  Result Value Ref Range   Glucose-Capillary 147 (H) 65 - 99 mg/dL    Signed: Burgess Estelle, MD 12/25/2015, 12:34 PM  Services Ordered on Discharge:  Equipment Ordered on Discharge:

## 2015-12-26 ENCOUNTER — Encounter (HOSPITAL_COMMUNITY): Payer: Self-pay | Admitting: Gastroenterology

## 2015-12-26 SURGERY — COLONOSCOPY
Anesthesia: Monitor Anesthesia Care

## 2016-01-08 IMAGING — CR DG CHEST 1V PORT
2 series · 2 of 2 positions shown · non-contrast
Comparison: 11/29/2012

CLINICAL DATA: SHORTNESS OF BREATH

EXAM:
PORTABLE CHEST - 1 VIEW

[AP (1 of 2)]
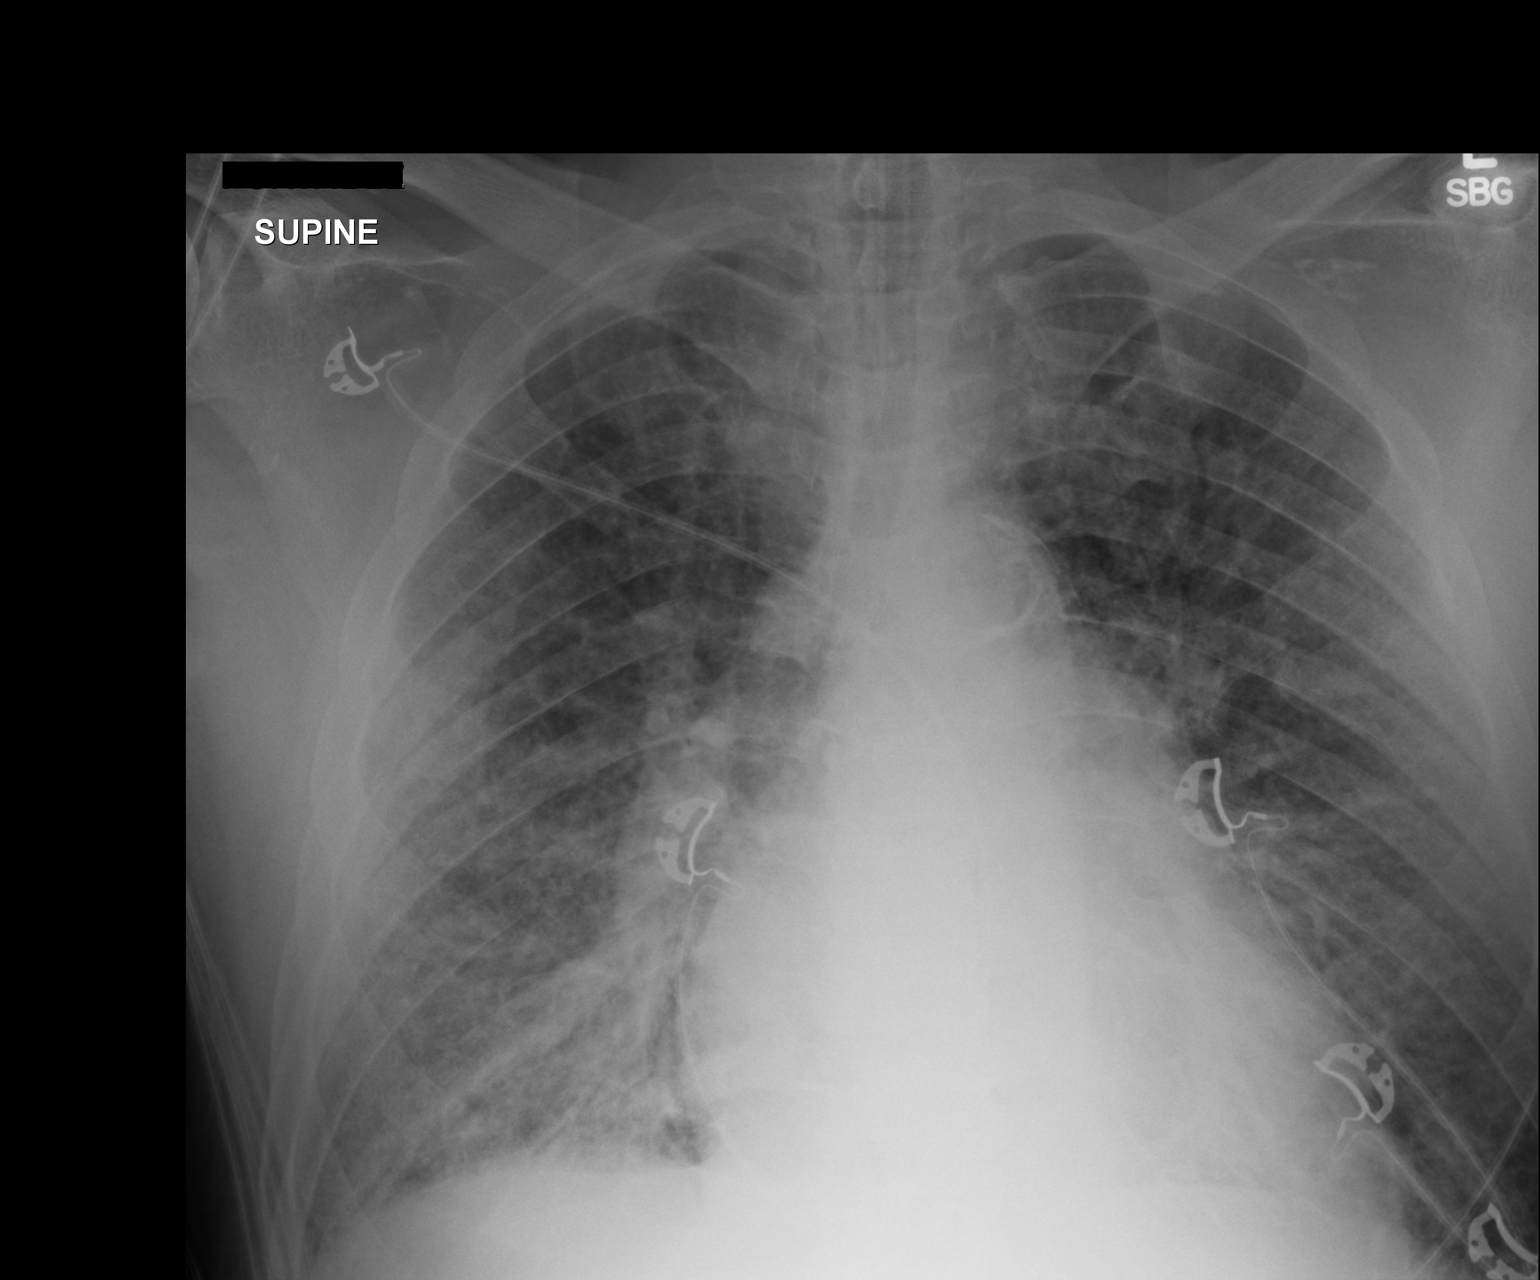

[AP (2 of 2)]
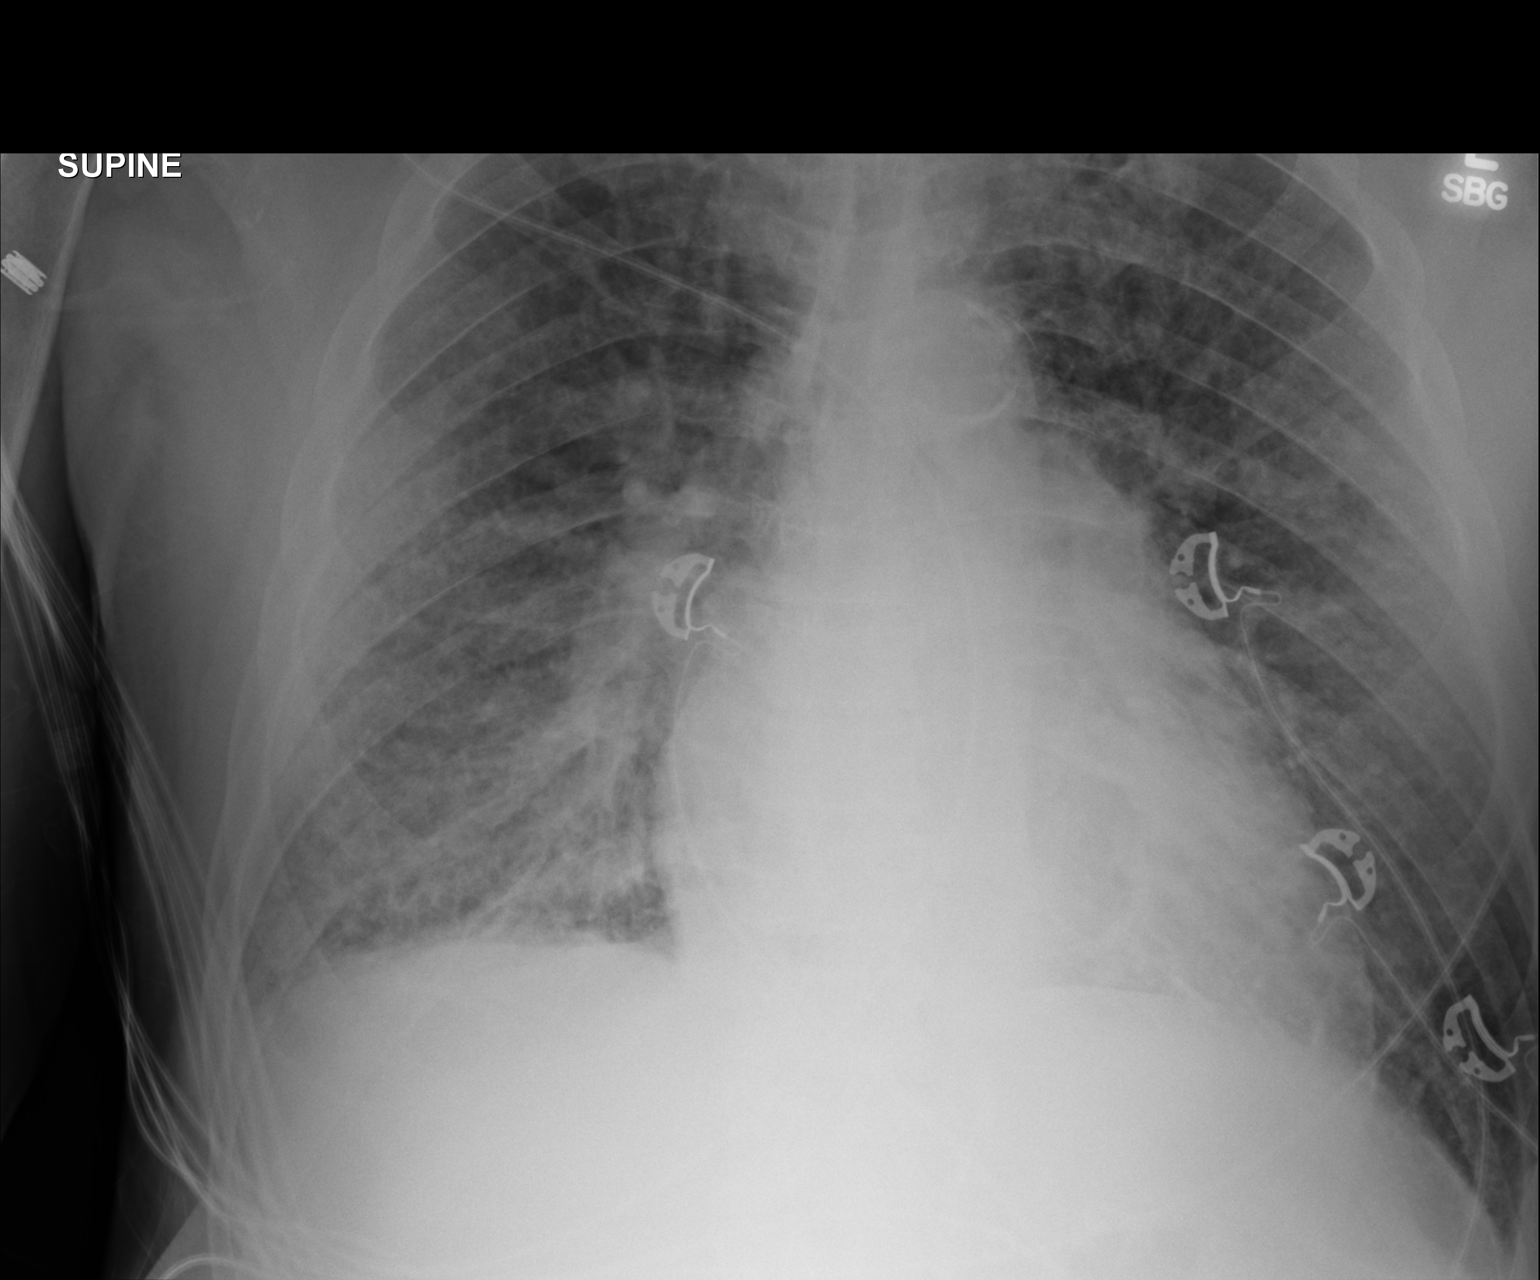

[2 of 2 positions shown; findings below may reference images not displayed]

FINDINGS: Endotracheal tube has been placed, tip approximately 5.8 cm above
carina. Atheromatous aorta. Heart size upper limits normal. Moderate
interstitial and airspace opacities in a predominantly perihilar and
infrahilar distribution, increased since previous exam, right
greater than left.
No effusion.
Visualized skeletal structures are unremarkable.
IMPRESSION: 1. Endotracheal tube tip 5.8 cm above carina.
2. Interval increase in asymmetric bilateral edema/infiltrates.

## 2016-01-09 IMAGING — CR DG CHEST 1V PORT
1 series · 1 of 1 positions shown · non-contrast
Comparison: 06/24/2014; 11/29/2012; 07/09/2011

CLINICAL DATA: Evaluate endotracheal tube, airspace disease

EXAM:
PORTABLE CHEST - 1 VIEW

[AP]
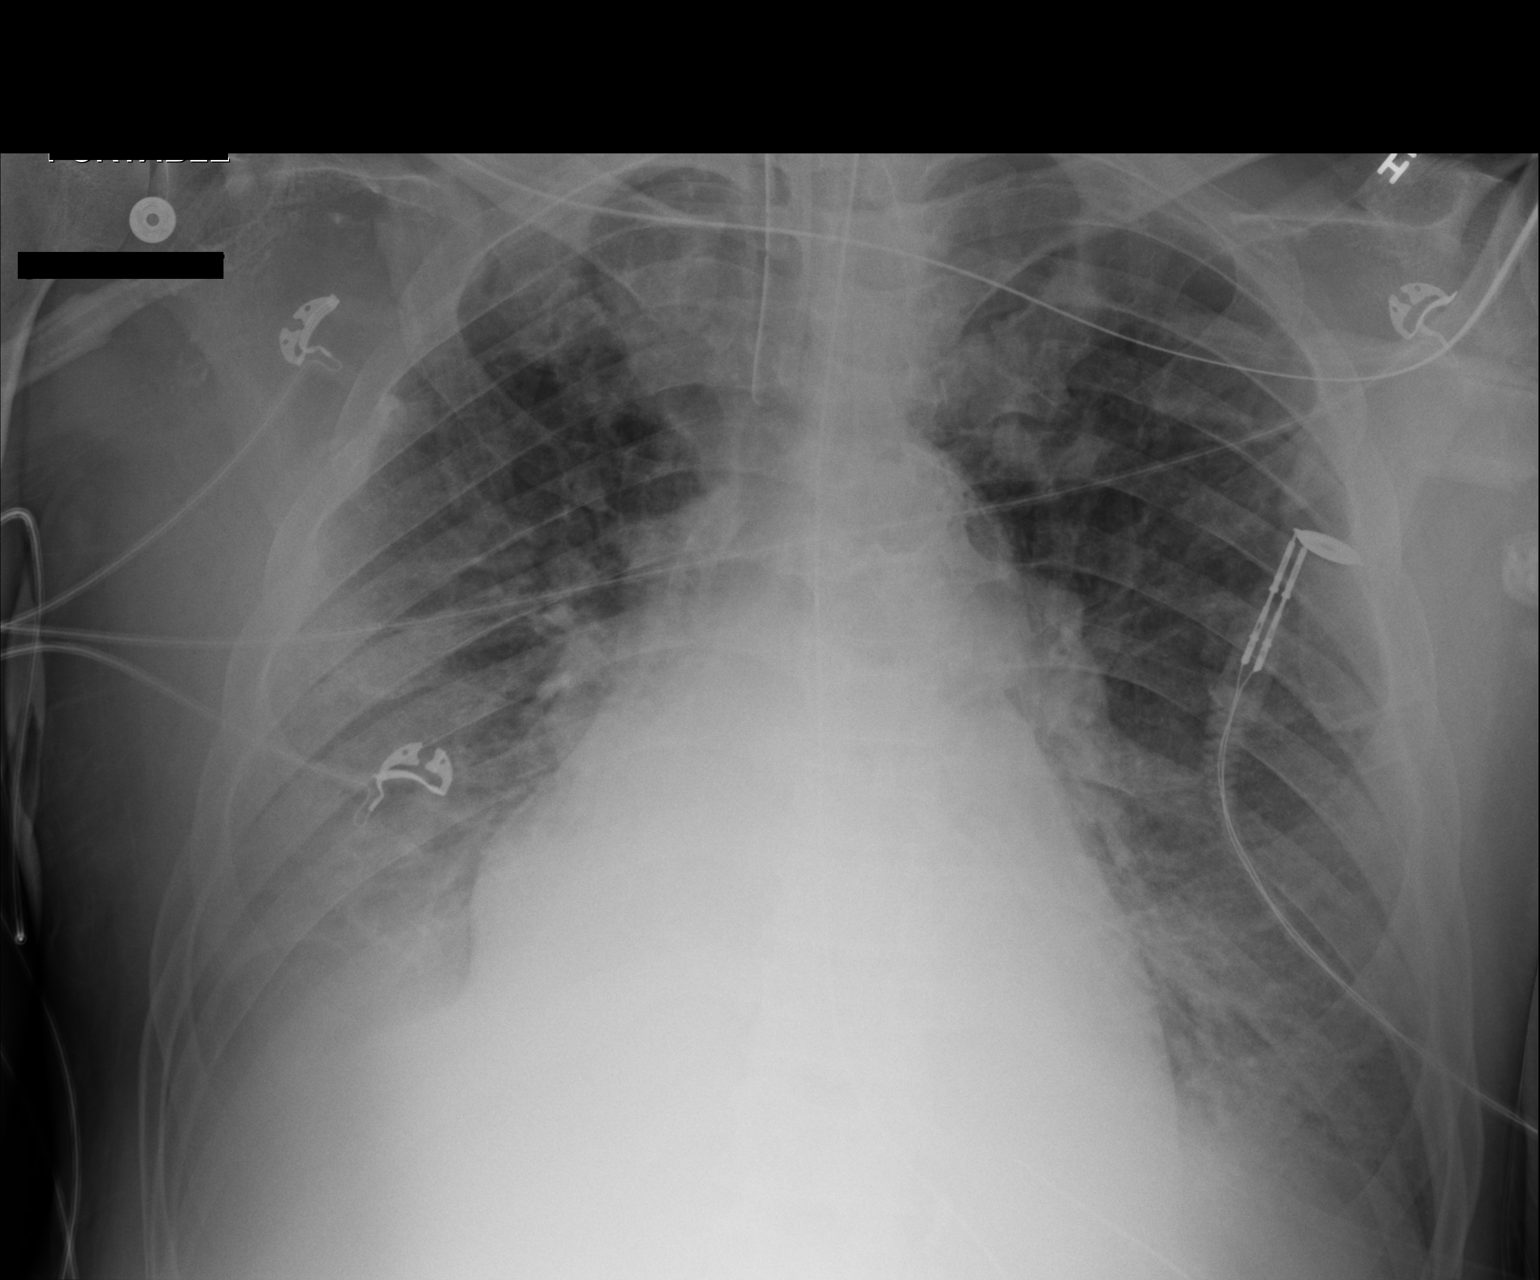

[1 of 1 positions shown; findings below may reference images not displayed]

FINDINGS: Grossly unchanged enlarged cardiac silhouette and mediastinal
contours given persistently reduced lung volumes and patient
rotation. Atherosclerotic plaque within the thoracic aorta. Stable
position of support apparatus. No pneumothorax. Worsening bilateral
mid and lower lung heterogeneous opacities, right greater than left.
Pulmonary vasculature remains indistinct with cephalization of flow.
Trace bilateral effusions are not excluded, right greater than left.
Grossly unchanged bones.
IMPRESSION: 1.  Stable positioning of support apparatus.  No pneumothorax.
2. Findings most suggestive of worsening pulmonary edema and
bibasilar opacities, atelectasis versus infiltrate.

## 2016-01-23 ENCOUNTER — Emergency Department (HOSPITAL_COMMUNITY): Payer: Medicare Other

## 2016-01-23 ENCOUNTER — Observation Stay (HOSPITAL_COMMUNITY)
Admission: EM | Admit: 2016-01-23 | Discharge: 2016-01-27 | Disposition: A | Discharge status: Home Health | Payer: Medicare Other | Attending: Oncology | Admitting: Oncology

## 2016-01-23 ENCOUNTER — Encounter (HOSPITAL_COMMUNITY): Payer: Self-pay | Admitting: Emergency Medicine

## 2016-01-23 DIAGNOSIS — F17210 Nicotine dependence, cigarettes, uncomplicated: Secondary | ICD-10-CM | POA: Insufficient documentation

## 2016-01-23 DIAGNOSIS — M79672 Pain in left foot: Secondary | ICD-10-CM | POA: Diagnosis not present

## 2016-01-23 DIAGNOSIS — D649 Anemia, unspecified: Secondary | ICD-10-CM | POA: Diagnosis not present

## 2016-01-23 DIAGNOSIS — E1122 Type 2 diabetes mellitus with diabetic chronic kidney disease: Secondary | ICD-10-CM | POA: Diagnosis not present

## 2016-01-23 DIAGNOSIS — F319 Bipolar disorder, unspecified: Secondary | ICD-10-CM | POA: Diagnosis not present

## 2016-01-23 DIAGNOSIS — I5032 Chronic diastolic (congestive) heart failure: Secondary | ICD-10-CM | POA: Diagnosis present

## 2016-01-23 DIAGNOSIS — Z720 Tobacco use: Secondary | ICD-10-CM | POA: Diagnosis present

## 2016-01-23 DIAGNOSIS — I2510 Atherosclerotic heart disease of native coronary artery without angina pectoris: Secondary | ICD-10-CM | POA: Diagnosis not present

## 2016-01-23 DIAGNOSIS — E1169 Type 2 diabetes mellitus with other specified complication: Secondary | ICD-10-CM | POA: Diagnosis present

## 2016-01-23 DIAGNOSIS — I130 Hypertensive heart and chronic kidney disease with heart failure and stage 1 through stage 4 chronic kidney disease, or unspecified chronic kidney disease: Secondary | ICD-10-CM | POA: Insufficient documentation

## 2016-01-23 DIAGNOSIS — E1151 Type 2 diabetes mellitus with diabetic peripheral angiopathy without gangrene: Secondary | ICD-10-CM | POA: Diagnosis not present

## 2016-01-23 DIAGNOSIS — I252 Old myocardial infarction: Secondary | ICD-10-CM | POA: Insufficient documentation

## 2016-01-23 DIAGNOSIS — Z8546 Personal history of malignant neoplasm of prostate: Secondary | ICD-10-CM | POA: Diagnosis not present

## 2016-01-23 DIAGNOSIS — Z7901 Long term (current) use of anticoagulants: Secondary | ICD-10-CM | POA: Diagnosis not present

## 2016-01-23 DIAGNOSIS — K5730 Diverticulosis of large intestine without perforation or abscess without bleeding: Secondary | ICD-10-CM | POA: Diagnosis not present

## 2016-01-23 DIAGNOSIS — Z923 Personal history of irradiation: Secondary | ICD-10-CM | POA: Diagnosis not present

## 2016-01-23 DIAGNOSIS — K219 Gastro-esophageal reflux disease without esophagitis: Secondary | ICD-10-CM | POA: Insufficient documentation

## 2016-01-23 DIAGNOSIS — E160 Drug-induced hypoglycemia without coma: Secondary | ICD-10-CM | POA: Insufficient documentation

## 2016-01-23 DIAGNOSIS — I739 Peripheral vascular disease, unspecified: Secondary | ICD-10-CM | POA: Diagnosis present

## 2016-01-23 DIAGNOSIS — I4891 Unspecified atrial fibrillation: Secondary | ICD-10-CM | POA: Diagnosis not present

## 2016-01-23 DIAGNOSIS — Z85118 Personal history of other malignant neoplasm of bronchus and lung: Secondary | ICD-10-CM | POA: Diagnosis not present

## 2016-01-23 DIAGNOSIS — Z955 Presence of coronary angioplasty implant and graft: Secondary | ICD-10-CM | POA: Diagnosis not present

## 2016-01-23 DIAGNOSIS — N183 Chronic kidney disease, stage 3 unspecified: Secondary | ICD-10-CM | POA: Diagnosis present

## 2016-01-23 DIAGNOSIS — K922 Gastrointestinal hemorrhage, unspecified: Secondary | ICD-10-CM | POA: Insufficient documentation

## 2016-01-23 DIAGNOSIS — Z8673 Personal history of transient ischemic attack (TIA), and cerebral infarction without residual deficits: Secondary | ICD-10-CM | POA: Diagnosis not present

## 2016-01-23 DIAGNOSIS — E785 Hyperlipidemia, unspecified: Secondary | ICD-10-CM | POA: Diagnosis not present

## 2016-01-23 DIAGNOSIS — Z794 Long term (current) use of insulin: Secondary | ICD-10-CM | POA: Insufficient documentation

## 2016-01-23 DIAGNOSIS — T383X5A Adverse effect of insulin and oral hypoglycemic [antidiabetic] drugs, initial encounter: Secondary | ICD-10-CM | POA: Insufficient documentation

## 2016-01-23 DIAGNOSIS — Z7902 Long term (current) use of antithrombotics/antiplatelets: Secondary | ICD-10-CM | POA: Diagnosis not present

## 2016-01-23 DIAGNOSIS — Z9861 Coronary angioplasty status: Secondary | ICD-10-CM

## 2016-01-23 DIAGNOSIS — I639 Cerebral infarction, unspecified: Secondary | ICD-10-CM | POA: Diagnosis present

## 2016-01-23 DIAGNOSIS — Z79899 Other long term (current) drug therapy: Secondary | ICD-10-CM | POA: Insufficient documentation

## 2016-01-23 DIAGNOSIS — K921 Melena: Principal | ICD-10-CM | POA: Insufficient documentation

## 2016-01-23 DIAGNOSIS — L02215 Cutaneous abscess of perineum: Secondary | ICD-10-CM | POA: Insufficient documentation

## 2016-01-23 DIAGNOSIS — L0231 Cutaneous abscess of buttock: Secondary | ICD-10-CM | POA: Insufficient documentation

## 2016-01-23 DIAGNOSIS — R0603 Acute respiratory distress: Secondary | ICD-10-CM | POA: Insufficient documentation

## 2016-01-23 DIAGNOSIS — I482 Chronic atrial fibrillation, unspecified: Secondary | ICD-10-CM | POA: Diagnosis present

## 2016-01-23 DIAGNOSIS — E1129 Type 2 diabetes mellitus with other diabetic kidney complication: Secondary | ICD-10-CM | POA: Diagnosis present

## 2016-01-23 DIAGNOSIS — I10 Essential (primary) hypertension: Secondary | ICD-10-CM | POA: Diagnosis present

## 2016-01-23 LAB — CBC WITH DIFFERENTIAL/PLATELET
BASOS ABS: 0.1 10*3/uL (ref 0.0–0.1)
BASOS PCT: 1 %
EOS ABS: 0.3 10*3/uL (ref 0.0–0.7)
Eosinophils Relative: 5 %
HCT: 34.3 % — ABNORMAL LOW (ref 39.0–52.0)
Hemoglobin: 11.2 g/dL — ABNORMAL LOW (ref 13.0–17.0)
Lymphocytes Relative: 29 %
Lymphs Abs: 1.6 10*3/uL (ref 0.7–4.0)
MCH: 31 pg (ref 26.0–34.0)
MCHC: 32.7 g/dL (ref 30.0–36.0)
MCV: 95 fL (ref 78.0–100.0)
MONO ABS: 0.7 10*3/uL (ref 0.1–1.0)
Monocytes Relative: 13 %
NEUTROS PCT: 52 %
Neutro Abs: 2.7 10*3/uL (ref 1.7–7.7)
PLATELETS: 209 10*3/uL (ref 150–400)
RBC: 3.61 MIL/uL — ABNORMAL LOW (ref 4.22–5.81)
RDW: 14.3 % (ref 11.5–15.5)
WBC: 5.4 10*3/uL (ref 4.0–10.5)

## 2016-01-23 LAB — BASIC METABOLIC PANEL
Anion gap: 11 (ref 5–15)
BUN: 32 mg/dL — AB (ref 6–20)
CO2: 27 mmol/L (ref 22–32)
CREATININE: 2.04 mg/dL — AB (ref 0.61–1.24)
Calcium: 8.8 mg/dL — ABNORMAL LOW (ref 8.9–10.3)
Chloride: 101 mmol/L (ref 101–111)
GFR calc Af Amer: 34 mL/min — ABNORMAL LOW (ref >60)
GFR, EST NON AFRICAN AMERICAN: 30 mL/min — AB (ref >60)
Glucose, Bld: 153 mg/dL — ABNORMAL HIGH (ref 65–99)
Potassium: 3.6 mmol/L (ref 3.5–5.1)
SODIUM: 139 mmol/L (ref 135–145)

## 2016-01-23 LAB — POC OCCULT BLOOD, ED: FECAL OCCULT BLD: POSITIVE — AB

## 2016-01-23 LAB — PROTIME-INR
INR: 1.27 (ref 0.00–1.49)
Prothrombin Time: 16 seconds — ABNORMAL HIGH (ref 11.6–15.2)

## 2016-01-23 MED ORDER — PNEUMOCOCCAL VAC POLYVALENT 25 MCG/0.5ML IJ INJ
0.5000 mL | INJECTION | INTRAMUSCULAR | Status: DC
  Filled 2016-01-23: qty 0.5

## 2016-01-23 MED ORDER — INSULIN GLARGINE 100 UNIT/ML ~~LOC~~ SOLN: 355.0000 [IU] | Freq: Every day | SUBCUTANEOUS | Status: DC

## 2016-01-23 MED ORDER — PANTOPRAZOLE SODIUM 40 MG PO TBEC
40.0000 mg | DELAYED_RELEASE_TABLET | Freq: Two times a day (BID) | ORAL | Status: DC
  Administered 2016-01-24 – 2016-01-27 (×7): 40 mg via ORAL
  Filled 2016-01-23 (×7): qty 1

## 2016-01-23 MED ORDER — SODIUM CHLORIDE 0.9% FLUSH
3.0000 mL | Freq: Two times a day (BID) | INTRAVENOUS | Status: DC
  Administered 2016-01-24 – 2016-01-27 (×7): 3 mL via INTRAVENOUS

## 2016-01-23 MED ORDER — INFLUENZA VAC SPLIT QUAD 0.5 ML IM SUSY: 0.5000 mL | PREFILLED_SYRINGE | INTRAMUSCULAR | Status: DC

## 2016-01-23 MED ORDER — ALBUTEROL SULFATE (2.5 MG/3ML) 0.083% IN NEBU
2.5000 mg | INHALATION_SOLUTION | Freq: Four times a day (QID) | RESPIRATORY_TRACT | Status: DC | PRN
  Administered 2016-01-26 (×2): 2.5 mg via RESPIRATORY_TRACT
  Filled 2016-01-23 (×2): qty 3

## 2016-01-23 MED ORDER — LATANOPROST 0.005 % OP SOLN
1.0000 [drp] | Freq: Every day | OPHTHALMIC | Status: DC
  Administered 2016-01-23 – 2016-01-26 (×4): 1 [drp] via OPHTHALMIC
  Filled 2016-01-23: qty 2.5

## 2016-01-23 MED ORDER — ATORVASTATIN CALCIUM 40 MG PO TABS
40.0000 mg | ORAL_TABLET | Freq: Every day | ORAL | Status: DC
  Administered 2016-01-23 – 2016-01-27 (×5): 40 mg via ORAL
  Filled 2016-01-23 (×3): qty 1
  Filled 2016-01-23: qty 4
  Filled 2016-01-23: qty 1

## 2016-01-23 MED ORDER — ALBUTEROL SULFATE HFA 108 (90 BASE) MCG/ACT IN AERS: 2.0000 | INHALATION_SPRAY | Freq: Four times a day (QID) | RESPIRATORY_TRACT | Status: DC | PRN

## 2016-01-23 MED ORDER — INSULIN GLARGINE 100 UNIT/ML ~~LOC~~ SOLN
35.0000 [IU] | Freq: Every day | SUBCUTANEOUS | Status: DC
Start: 2016-01-24 — End: 2016-01-27
  Administered 2016-01-24 – 2016-01-27 (×3): 35 [IU] via SUBCUTANEOUS
  Filled 2016-01-23 (×4): qty 0.35

## 2016-01-23 MED ORDER — GABAPENTIN 300 MG PO CAPS
300.0000 mg | ORAL_CAPSULE | Freq: Two times a day (BID) | ORAL | Status: DC
  Administered 2016-01-23 – 2016-01-27 (×8): 300 mg via ORAL
  Filled 2016-01-23 (×8): qty 1

## 2016-01-23 MED ORDER — SODIUM CHLORIDE 0.9 % IV SOLN
INTRAVENOUS | Status: AC
  Administered 2016-01-23 – 2016-01-24 (×2): via INTRAVENOUS

## 2016-01-23 MED ORDER — DIVALPROEX SODIUM ER 500 MG PO TB24
500.0000 mg | ORAL_TABLET | Freq: Two times a day (BID) | ORAL | Status: DC
  Filled 2016-01-23 (×6): qty 1

## 2016-01-23 MED ORDER — PANTOPRAZOLE SODIUM 40 MG PO TBEC
40.0000 mg | DELAYED_RELEASE_TABLET | Freq: Every day | ORAL | Status: DC
  Administered 2016-01-23: 40 mg via ORAL
  Filled 2016-01-23: qty 1

## 2016-01-23 MED ORDER — VITAMIN D3 25 MCG (1000 UNIT) PO TABS
1000.0000 [IU] | ORAL_TABLET | Freq: Every day | ORAL | Status: DC
Start: 2016-01-24 — End: 2016-01-27
  Administered 2016-01-24 – 2016-01-27 (×4): 1000 [IU] via ORAL
  Filled 2016-01-23 (×8): qty 1

## 2016-01-23 MED ORDER — GLUCERNA SHAKE PO LIQD
237.0000 mL | Freq: Three times a day (TID) | ORAL | Status: DC
  Administered 2016-01-23 – 2016-01-27 (×9): 237 mL via ORAL
  Filled 2016-01-23: qty 237

## 2016-01-23 MED ORDER — NYSTATIN 100000 UNIT/ML MT SUSP
5.0000 mL | Freq: Four times a day (QID) | OROMUCOSAL | Status: DC
  Administered 2016-01-23 – 2016-01-27 (×16): 500000 [IU] via ORAL
  Filled 2016-01-23 (×15): qty 5

## 2016-01-23 NOTE — Progress Notes (Signed)
Pt refused his DEPAKOTE ER said he has stop taking it for about 3 months and therefore he doesn't know why the doctor wants him to take it again, he also refused his SCDS  md on call has been made aware, will continue to monitor

## 2016-01-23 NOTE — ED Provider Notes (Signed)
CSN: [id number]     Arrival date & time 01/23/16  1040 History   First MD Initiated Contact with Patient 01/23/16 1056     Chief Complaint  Patient presents with  . Melena     (Consider location/radiation/quality/duration/timing/severity/associated sxs/prior Treatment) HPI  Patient is a 80 year old male with past medical history HTN, CAD, PVD, DM, GERD, Afib (on Eliquis), Prostate Cancer and CVA who presents to the ED with complaint of dark stools, onset 3 days. Pt reports having dark "black" stools for the past few days with associated diarrhea, fatigue and lightheadedness. He notes his lightheadedness occurs when he goes from sitting to standing. Pt reports 3 days ago when he got out of bed to use the restroom he became lightheaded resulting him having a "slow fall" where he fell onto his knees. He notes he hit his head on the corner of the tx stand. Denies LOC. Denies fever, chills, HA, cough, SOB, CP, abdominal pain, N/V, urinary sxs, gross blood in urine or stool, numbness, tingling, weakness, syncope, seizure. Pt reports he has had similar episodes of melena in the past and notes he was last admitted at [facility] on 12/23/15. Pt reports his PCP restarted his Eliquis appx. 2 weeks ago.   Past Medical History  Diagnosis Date  . Hyperlipidemia   . HTN (hypertension)     echo 2/11: EF 50-55%, diast dyfxn, severe LVH, inf HK, LAE  . CAD (coronary artery disease)     a.  NSTEMI treated with PCI Feb 2011 with a DES.([facility]);   b. cath 2/11: D1 stents x 2 ok, AV groove CFX occluded with dist AV CFX filled by L-L collats, RCA occluded, OM2 90-95% (treated with PCI)  . MI (myocardial infarction) ([place])   . PVD (peripheral vascular disease) ([place])   . DM (diabetes mellitus) ([place])   . GERD (gastroesophageal reflux disease)   . Bipolar disorder ([place])   . Prostate cancer [facility])     status post radiation therapy in 2003  . Pulmonary edema   . Atrial fibrillation ([place])   . Respiratory difficulty  06/24/2014    intubated   . Hypertensive emergency 06/24/2014  . CKD (chronic kidney disease), stage III   . Anemia 11/28/2015  . Shortness of breath dyspnea   . Stroke [facility]) 07/2014   Past Surgical History  Procedure Laterality Date  . Femoral bypass  2003  . Esophagogastroduodenoscopy N/A 11/29/2015    Procedure: ESOPHAGOGASTRODUODENOSCOPY (EGD);  Surgeon: Wonda Horner, MD;  Location: [facility] ENDOSCOPY;  Service: Endoscopy;  Laterality: N/A;  . Esophagogastroduodenoscopy (egd) with propofol Left 12/25/2015    Procedure: ESOPHAGOGASTRODUODENOSCOPY (EGD) WITH PROPOFOL;  Surgeon: Arta Silence, MD;  Location[facility] END[facility]COPY;  Service: Endoscopy;  Laterality: Left;   Family History  Problem Relation Age of Onset  . Cancer Mother   . Cancer Father   . Cancer Sister    Social History  Substance Use Topics  . Smoking status: Current Every Day Smoker -- 0.50 packs/day for 50 years    Types: Cigarettes  . Smokeless tobacco: Never Used     Comment: He has a 50-pack-year hx of tobacco abuse. Currently, smoking about half a pack a day.  . Alcohol Use: Yes     Comment: Previously drank heavily, and now has a drink every other day or so.    Review of Systems  Constitutional: Positive for fatigue.  Gastrointestinal: Positive for blood in stool.  Neurological: Positive for light-headedness.  All other systems reviewed and are  negative.     Allergies  Penicillins  Home Medications   Prior to Admission medications   Medication Sig Start Date End Date Taking? Authorizing Provider  acetaminophen (TYLENOL) 325 MG tablet Take 650 mg by mouth 2 (two) times daily.    Yes Historical Provider, MD  albuterol (PROVENTIL HFA;VENTOLIN HFA) 108 (90 BASE) MCG/ACT inhaler Inhale 2 puffs into the lungs every 6 (six) hours as needed for shortness of breath.   Yes Historical Provider, MD  ammonium lactate (LAC-HYDRIN) 12 % lotion Apply 1 application topically daily.   Yes Historical Provider, MD  apixaban  (ELIQUIS) 5 MG TABS tablet Take 5 mg by mouth every 12 (twelve) hours.   Yes Historical Provider, MD  atorvastatin (LIPITOR) 40 MG tablet Take 1 tablet (40 mg total) by mouth daily. Patient taking differently: Take 20 mg by mouth at bedtime.  08/01/14  Yes Sherren Mocha, MD  cholecalciferol (VITAMIN D) 1000 UNITS tablet Take 1,000 Units by mouth daily.   Yes Historical Provider, MD  cilostazol (PLETAL) 50 MG tablet Take 50 mg by mouth every 12 (twelve) hours.    Yes Historical Provider, MD  dextrose (GLUTOSE) 40 % GEL Take 1 Tube by mouth daily as needed for low blood sugar.    Yes Historical Provider, MD  diclofenac sodium (VOLTAREN) 1 % GEL Apply 2 g topically 4 (four) times daily. 11/29/15  Yes Alexa Sherral Hammers, MD  divalproex (DEPAKOTE ER) 500 MG 24 hr tablet Take 500 mg by mouth 2 (two) times daily.   Yes Historical Provider, MD  feeding supplement, GLUCERNA SHAKE, (GLUCERNA SHAKE) LIQD Take 237 mLs by mouth 3 (three) times daily between meals.   Yes Historical Provider, MD  finasteride (PROSCAR) 5 MG tablet Take 5 mg by mouth daily.     Yes Historical Provider, MD  furosemide (LASIX) 40 MG tablet Take 80 mg by mouth daily.    Yes Historical Provider, MD  gabapentin (NEURONTIN) 300 MG capsule Take 300 mg by mouth 2 (two) times daily.    Yes Historical Provider, MD  insulin glargine (LANTUS) 100 UNIT/ML injection Inject 40 Units into the skin daily. 07/20/14  Yes Geradine Girt, DO  insulin regular (HUMULIN R,NOVOLIN R) 100 units/mL injection Inject 5-7 Units into the skin 2 (two) times daily before a meal. Per sliding scale   Yes Historical Provider, MD  latanoprost (XALATAN) 0.005 % ophthalmic solution Place 1 drop into both eyes at bedtime.   Yes Historical Provider, MD  lisinopril (PRINIVIL,ZESTRIL) 20 MG tablet Take 10 mg by mouth daily.   Yes Historical Provider, MD  metoprolol (LOPRESSOR) 50 MG tablet Take 50 mg by mouth 2 (two) times daily.   Yes Historical Provider, MD  Multiple  Vitamins-Minerals (MULTIVITAMIN WITH MINERALS) tablet Take 1 tablet by mouth daily.   Yes Historical Provider, MD  nystatin (MYCOSTATIN) 100000 UNIT/ML suspension Take 5 mLs (500,000 Units total) by mouth 4 (four) times daily. 11/29/15  Yes Alexa Sherral Hammers, MD  pantoprazole (PROTONIX) 40 MG tablet Take 1 tablet (40 mg total) by mouth daily. 11/29/15  Yes Alexa Sherral Hammers, MD  simethicone (MYLICON) 80 MG chewable tablet Chew 80 mg by mouth 3 (three) times daily as needed for flatulence. Take with meals as needed   Yes Historical Provider, MD  tadalafil (CIALIS) 10 MG tablet Take 10 mg by mouth daily as needed for erectile dysfunction.   Yes Historical Provider, MD  Tamsulosin HCl (FLOMAX) 0.4 MG CAPS Take 0.4 mg by mouth daily.  Yes Historical Provider, MD  vitamin B-12 (CYANOCOBALAMIN) 1000 MCG tablet Take 1,000 mcg by mouth daily.   Yes Historical Provider, MD   BP 106/69 mmHg  Pulse 86  Temp(Src) 98.6 F (37 C) (Oral)  Resp 19  SpO2 95% Physical Exam  Constitutional: He is oriented to person, place, and time. He appears well-developed and well-nourished. No distress.  HENT:  Head: Normocephalic and atraumatic. Head is without raccoon's eyes, without Battle's sign, without abrasion, without contusion and without laceration.  Right Ear: Tympanic membrane normal.  Left Ear: Tympanic membrane normal.  Nose: Nose normal.  Mouth/Throat: Uvula is midline, oropharynx is clear and moist and mucous membranes are normal. No oropharyngeal exudate.  Eyes: Conjunctivae and EOM are normal. Pupils are equal, round, and reactive to light. Right eye exhibits no discharge. Left eye exhibits no discharge. No scleral icterus.  Neck: Normal range of motion. Neck supple.  Cardiovascular: Normal rate, regular rhythm, normal heart sounds and intact distal pulses.   Pulmonary/Chest: Effort normal and breath sounds normal. No respiratory distress. He has no wheezes. He has no rales. He exhibits no tenderness.   Abdominal: Soft. Bowel sounds are normal. He exhibits no distension and no mass. There is no tenderness. There is no rebound and no guarding.  Genitourinary: Rectal exam shows no external hemorrhoid, no internal hemorrhoid, no fissure, no mass, no tenderness and anal tone normal. Guaiac positive stool.  Musculoskeletal: Normal range of motion. He exhibits no edema or tenderness.  Lymphadenopathy:    He has no cervical adenopathy.  Neurological: He is alert and oriented to person, place, and time. He has normal strength. No cranial nerve deficit or sensory deficit. He displays a negative Romberg sign.  Skin: Skin is warm and dry. He is not diaphoretic.  Nursing note and vitals reviewed.   ED Course  Procedures (including critical care time) Labs Review Labs Reviewed  CBC WITH DIFFERENTIAL/PLATELET - Abnormal; Notable for the following:    RBC 3.61 (*)    Hemoglobin 11.2 (*)    HCT 34.3 (*)    All other components within normal limits  BASIC METABOLIC PANEL - Abnormal; Notable for the following:    Glucose, Bld 153 (*)    BUN 32 (*)    Creatinine, Ser 2.04 (*)    Calcium 8.8 (*)    GFR calc non Af Amer 30 (*)    GFR calc Af Amer 34 (*)    All other components within normal limits  PROTIME-INR - Abnormal; Notable for the following:    Prothrombin Time 16.0 (*)    All other components within normal limits  POC OCCULT BLOOD, ED - Abnormal; Notable for the following:    Fecal Occult Bld POSITIVE (*)    All other components within normal limits    Imaging Review Ct Head Wo Contrast  01/23/2016  CLINICAL DATA:  Lightheaded.  Fell last week. EXAM: CT HEAD WITHOUT CONTRAST TECHNIQUE: Contiguous axial images were obtained from the base of the skull through the vertex without intravenous contrast. COMPARISON:  August 25, 2014 FINDINGS: Paranasal sinuses are well aerated. A few inferior mastoid air cells are opacified bilaterally with no overlying bony erosion or soft tissue swelling. There  is a depressed fracture of the medial right orbit which is stable and chronic. No acute bony abnormalities are identified. Extracranial soft tissues are within normal limits. No subdural, epidural, or subarachnoid hemorrhage. No mass, mass effect, or midline shift. Ventricles and sulci are prominent stable. Scattered white matter  changes are seen, particularly in the subcortical region on the right on image 20, not appreciated on the previous study. No acute cortical ischemia or infarct is identified. IMPRESSION: 1. No acute cortical ischemia or infarct. No bleed. Subcortical white matter changes on the right, such as on image 20, are age indeterminate but more conspicuous since August 2015. Electronically Signed   By: Dorise Bullion III M.D   On: 01/23/2016 13:32   I have personally reviewed and evaluated these images and lab results as part of my medical decision-making.   EKG Interpretation None      MDM   Final diagnoses:  Melena    Pt presents with dark/black stools x3 days. He also reports falling 3 days ago due to lightheadedness, endorses head injury, denies LOC. Hx of melena, HTN, CAD, Afib (on Eliquis), DM. VSS. Exam unremarkable. Rectal exam nml, no gross blood.   Chart review showed that pt was last admitted to [facility] on 12/23/15 for similar dark stool, negative EGD, Eliquis was d/c and pt was discharged home with plan to have outpatient colonoscopy. Pt reports he has not had a colonoscopy. He notes he followed up with his PCP in [place] s/p his admission and was restarted on Eliquis 2 weeks ago.   Hgb 11.2. Positive hemoccult. BUN 32, Cr 2.04. PT 16, INR 1.27. CT head w/o contrast shoed no bleed, no acute cortical ischemia or infarct.  Consulted hospitalist for admission, agree to admit pt and will come evaluate the pt in the ED. Discussed results and plan for admission with pt.    Chesley Noon [place], [place] 01/23/16 1518  Julianne Rice, MD 01/24/16 [phone]

## 2016-01-23 NOTE — ED Notes (Signed)
PT assisted back into bed after using the bedside toilet. PT had a brown BM and urinated. PT has no requests at this time. PT reports no pain at this time.

## 2016-01-23 NOTE — ED Notes (Signed)
PT is asleep at this time

## 2016-01-23 NOTE — H&P (Signed)
Date: 01/23/2016               Patient Name:  [name] MRN: [id number]  DOB: 11-Apr-1936 Age / Sex: 80 y.o., male   PCP: Jilda Panda, MD         Medical Service: Internal Medicine Teaching Service    Attending Physician: Dr. Annia Belt, MD          Chief Complaint: dark stools   History of Present Illness: [name] is a 81 year-old man with past medical history of atrial fibrillation on Eliquis (CHADSVASc 8), stroke in 2015, congestive heart failure (EF 40-45% in 2015), coronary artery disease, stage 3 chronic kidney disease, hypertension, peripheral vascular disease and history of lung cancer (s/p radiation) who presents with recurrent dark bloody stools.   [name] was admitted from 11/27/16-11/28/16 for melanotic stools and underwent EGD showing candidal esophagitis and a few erosions in the prepyloric antrum, but no signs of active bleeding. His most recent colonoscopy was 3-4 years ago, which reportedly showed some polyps per patient, and he was told to follow up in 3-5 years. It was decided that his Eliquis was probably causing oozing leading to the melena and it was recommended that he start a proton pump inhibitor, but could continue the Eliquis. He was also given a course of Nystatin for the Candida esophagitis. Notably, his hemoglobin remained stable above 9 g/dL throughout that admission. He was discharged and reports having normal stools at home, but then returned to the hospital on 12/22/16 with recurrence of melena and fatigue. This time, he was hypotensive to 88'P systolic. He had repeat EGD due to concern for worsening gastric erosions, but no ulcers or erosions were identified and there was no evidence of active or past bleeding. Two small solitary arteriovenous malformations were noted, but neither was actively bleeding. He was discharged with Eliquis on hold until follow up with his PCP and recommendation to pursue colonoscopy or capsule endoscopy as an  outpatient.   Today, [name] returns with a 4 day history of dark stools and lightheadedness upon standing. He says his stools are dark with every bowel movement, including diarrhea yesterday. He denies any bright red blood. He reports having seen his PCP and restarted his Eliquis about 3 weeks ago. He says he also finished his Nystatin course a few days ago. He has not had a colonoscopy yet. Prior to restarting the Eliquis, he reports having normal bowel movements.   [name] also complains of numbness in his right 4th and 5th fingers for which he has occasionally tried Aleve with some relief. Otherwise, he has no complaints. He specifically denies abdominal pain, nausea, vomiting, headache, dizziness, shortness of breath, fever, chills, or dysuria.    Meds:  Medication Sig  . acetaminophen (TYLENOL) 325 MG tablet Take 650 mg by mouth 2 (two) times daily.   Marland Kitchen albuterol (PROVENTIL HFA;VENTOLIN HFA) 108 (90 BASE) MCG/ACT inhaler Inhale 2 puffs into the lungs every 6 (six) hours as needed for shortness of breath.  Marland Kitchen ammonium lactate (LAC-HYDRIN) 12 % lotion Apply 1 application topically daily.  Marland Kitchen apixaban (ELIQUIS) 5 MG TABS tablet Take 5 mg by mouth every 12 (twelve) hours.  Marland Kitchen atorvastatin (LIPITOR) 40 MG tablet Take 1 tablet (40 mg total) by mouth daily. (Patient taking differently: Take 20 mg by mouth at bedtime. )  . cholecalciferol (VITAMIN D) 1000 UNITS tablet Take 1,000 Units by mouth daily.  . cilostazol (PLETAL) 50 MG tablet Take  50 mg by mouth every 12 (twelve) hours.   Marland Kitchen dextrose (GLUTOSE) 40 % GEL Take 1 Tube by mouth daily as needed for low blood sugar.   . diclofenac sodium (VOLTAREN) 1 % GEL Apply 2 g topically 4 (four) times daily.  . divalproex (DEPAKOTE ER) 500 MG 24 hr tablet Take 500 mg by mouth 2 (two) times daily.  . feeding supplement, GLUCERNA SHAKE, (GLUCERNA SHAKE) LIQD Take 237 mLs by mouth 3 (three) times daily between meals.  . finasteride (PROSCAR) 5 MG tablet  Take 5 mg by mouth daily.    . furosemide (LASIX) 40 MG tablet Take 80 mg by mouth daily.   Marland Kitchen gabapentin (NEURONTIN) 300 MG capsule Take 300 mg by mouth 2 (two) times daily.   . insulin glargine (LANTUS) 100 UNIT/ML injection Inject 40 Units into the skin daily.  . insulin regular (HUMULIN R,NOVOLIN R) 100 units/mL injection Inject 5-7 Units into the skin 2 (two) times daily before a meal. Per sliding scale  . latanoprost (XALATAN) 0.005 % ophthalmic solution Place 1 drop into both eyes at bedtime.  Marland Kitchen lisinopril (PRINIVIL,ZESTRIL) 20 MG tablet Take 10 mg by mouth daily.  . metoprolol (LOPRESSOR) 50 MG tablet Take 50 mg by mouth 2 (two) times daily.  . Multiple Vitamins-Minerals (MULTIVITAMIN WITH MINERALS) tablet Take 1 tablet by mouth daily.  Marland Kitchen nystatin (MYCOSTATIN) 100000 UNIT/ML suspension Take 5 mLs (500,000 Units total) by mouth 4 (four) times daily.  . pantoprazole (PROTONIX) 40 MG tablet Take 1 tablet (40 mg total) by mouth daily.  . simethicone (MYLICON) 80 MG chewable tablet Chew 80 mg by mouth 3 (three) times daily as needed for flatulence. Take with meals as needed  . tadalafil (CIALIS) 10 MG tablet Take 10 mg by mouth daily as needed for erectile dysfunction.  . Tamsulosin HCl (FLOMAX) 0.4 MG CAPS Take 0.4 mg by mouth daily.    . vitamin B-12 (CYANOCOBALAMIN) 1000 MCG tablet Take 1,000 mcg by mouth daily.    Allergies: Penicillin  Past Medical History: Past Medical History  Diagnosis Date  . Hyperlipidemia   . HTN (hypertension)     echo 2/11: EF 50-55%, diast dyfxn, severe LVH, inf HK, LAE  . CAD (coronary artery disease)     a.  NSTEMI treated with PCI Feb 2011 with a DES.([facility]);   b. cath 2/11: D1 stents x 2 ok, AV groove CFX occluded with dist AV CFX filled by L-L collats, RCA occluded, OM2 90-95% (treated with PCI)  . MI (myocardial infarction) ([place])   . PVD (peripheral vascular disease) ([place])   . DM (diabetes mellitus) ([place])   . GERD (gastroesophageal reflux  disease)   . Bipolar disorder ([place])   . Prostate cancer [facility])     status post radiation therapy in 2003  . Pulmonary edema   . Atrial fibrillation ([place])   . Respiratory difficulty 06/24/2014    intubated   . Hypertensive emergency 06/24/2014  . CKD (chronic kidney disease), stage III   . Anemia 11/28/2015  . Shortness of breath dyspnea   . Stroke [facility]) 07/2014   Past Surgical History  Procedure Laterality Date  . Femoral bypass  2003  . Esophagogastroduodenoscopy N/A 11/29/2015    Procedure: ESOPHAGOGASTRODUODENOSCOPY (EGD);  Surgeon: Wonda Horner, MD;  Location: [facility] ENDOSCOPY;  Service: Endoscopy;  Laterality: N/A;  . Esophagogastroduodenoscopy (egd) with propofol Left 12/25/2015    Procedure: ESOPHAGOGASTRODUODENOSCOPY (EGD) WITH PROPOFOL;  Surgeon: Arta Silence, MD;  Location: [facility] ENDOSCOPY;  Service: Endoscopy;  Laterality: Left;    Family History: Cancer (type unknown): mother, father, sister Negative for family history of colon cancer or colon polyps.   Social History: Mr. Bramel is a current daily smoker and has smoked about 1/2 pack per day for 50 years. He previously drank heavily and now reports drinking every other day or so. He denies any illicit drug use.   Review of Systems: Review of Systems  Constitutional: Negative for fever, chills and weight loss.  HENT: Positive for sore throat.   Eyes: Negative for blurred vision and double vision.  Respiratory: Negative for hemoptysis and shortness of breath.   Cardiovascular: Positive for leg swelling.  Gastrointestinal: Positive for diarrhea and melena. Negative for abdominal pain.  Genitourinary: Negative for urgency and frequency.  Musculoskeletal: Negative for falls.  Neurological: Positive for dizziness, tingling and weakness. Negative for headaches.       Tingling in R 4th and 5th fingers for many weeks  Psychiatric/Behavioral: Negative for substance abuse.   Physical Exam: Blood pressure 106/69, pulse 86,  temperature 98.6 F (37 C), temperature source Oral, resp. rate 19, SpO2 95 %. General: Well-appearing elderly African American man, lying comfortably in bed in no acute distress HEENT: Normocephalic, atraumatic, moist mucous membranes, oropharynx clear, sclera clear Cardio: Irregularly irregular, no murmurs, rubs or gallops. Trace lower extremity edema.  Pulm/Chest: Lungs clear to auscultation bilaterally with no wheezes or crackles. Work of breathing is normal.  Abd: Soft, non-distended, non-tender. Normal bowel sounds.  MSK: Grossly normal range of motion in joints, no focal joint tenderness.  Skin: Warm and dry. Mild clubbing of fingernails.  Neuro: Alert and oriented to person, time and place. CN II-XII grossly intact. Strength and sensation grossly intact.  Psych: Appropriate behavior and affect.   Lab results: CBC: RBC 3.61, Hgb 11.2, Hct 34.3, MCV 95.0.  BMP: Na 139, K 3.6, Cl 101, CO2 27, Glu 153, BUN 32, Cr 2.04, Ca 8.8, GFR 34.  PT 16.0 / INR 1.27.  Fecal occult blood test: positive  Imaging results:  Ct Head Wo Contrast obtained due to patient report of lightheadedness and possible fall last week showed no acute cortical ischemia or infarct. No bleed. Subcortical white matter changes on the right that were age indeterminate but more conspicuous since August 2015.  Other results: EKG: atrial fibrillation, right bundle branch block, abnormal lateral Q waves. Q waves not seen on comparison EKG from 12/23/15.    Assessment & Plan: Mr. Toy Eisemann is a 80 year-old man with past medical history of atrial fibrillation on Eliquis (CHADSVASc 8), stroke, congestive heart failure (EF 40-45% in 2015), coronary artery disease, stage 3 chronic kidney disease, hypertension, peripheral vascular disease and history of lung cancer (s/p radiation) who presents with recurrent dark bloody stools. He has a positive fecal occult blood test, but his hemoglobin is stable at 11.2 g/dL and his exam is  relatively benign. He likely needs observation overnight and outpatient colonoscopy.   1. Recurrent melena: This is a chronic problem for Mr. Hosking that is likely secondary to small amounts of bleeding due to his Eliquis. Past workup including two EGD's has failed to reveal evidence of active bleed or any ulcers or erosions that may be a source of bleeding. With stable hemoglobin of 11.2 g/dL (compared to 10.7 g/dL on hospital discharge one month ago), and only mild lightheadedness, he does not need transfusion or urgent intervention to stop his bleeding at this time. We will hold his Eliquis and observe him overnight  and then he will likely need outpatient follow up for either capsule endoscopy and/or colonoscopy to further investigate his bleeding. He has been followed by GI during past admissions, so we may ask their assistance again while he is here.  - Consult GI, recommendations appreciated - Hold home Eliquis 5 mg daily - Continue pantoprazole 40 mg daily  - IV NS at 100 ml/hr overnight  2. Atrial fibrillation: On Eliquis 5 mg daily and metoprolol 50 mg twice daily at home. - Holding home meds in setting of bleeding.  3. Congestive heart failure: No significant volume overload on exam. EF of 40-45% per echo on 08/27/2014. On Lasix 80 mg daily, lisinopril 10 mg daily and metoprolol 50 mg twice daily at home.  - Hold home meds due to mild hypotension.    4. Type 2 diabetes mellitus: On Lantus 40 units each morning and Humulin 5-7 units twice daily before meals according to sliding scale at home. Most recent A1c of 10.4% in Dec 2016.  - Sliding scale insulin, sensitive regimen - Continue Lantus 40 units each morning   5. Coronary artery disease: Stable.  - Continue home atorvastatin 40 mg daily   6. Peripheral vascular disease: On cilostazol 50 mg twice daily at home.  - Hold cilostazol.   FEN: Fluids: 0.9% NS at 100 ml/hr  Electrolytes: stable, replete as needed Nutrition: carb  modified  Code Status: Full  Dispo: Anticipated discharge in next 1-2 days.   This is a Careers information officer Note.  The care of the patient was discussed with Dr. Juleen China and the assessment and plan was formulated with their assistance.  Please see their note for official documentation of the patient encounter.   Everlene Balls, [place] [facility]  01/23/2016, 3:25 PM

## 2016-01-23 NOTE — H&P (Signed)
Date: 01/23/2016               Patient Name:  [name] MRN: [id number]  DOB: 04-05-1936 Age / Sex: 80 y.o., male   PCP: Jilda Panda, MD         Medical Service: Internal Medicine Teaching Service         Attending Physician: Dr. Annia Belt, MD    First Contact: Dr. Jule Ser Pager: [phone]  Second Contact: Dr. Albin Felling Pager: [phone]       After Hours (After 5p/  First Contact Pager: [phone]  weekends / holidays): Second Contact Pager: [phone]   Chief Complaint: "dark stools"  History of Present Illness: [name] is a 80 y.o. man with past medical history of CVA, atrial fibrillation (CHADSVASc 8) on Eliquis, HFrEF (EF 40-45% in 2015), coronary artery disease, CKD stage 3, hypertension, peripheral vascular disease, and history of lung cancer (status post radiation) who presents due to recurrent melenotic stools.  Patient was previously admitted from Dec 1-2, 2016 for melenotic stool and had EGD with Dr. Penelope Coop which showed candidal esophagitis and few erosions in the pre-pyloric antrum.  It did not show signs of active bleeding.  At that time, it was determined the Eliquis was possibly causing oozing and led to melena.  Patient was recommended to start PPI and continue Eliquis.  He was also given course of Nystatin for candida esophagitis.  During that admission, his hemoglobin remained stable above 9.  After this discharge, he reportedly was having normal bowel movements and stool at home, but then returned to the hospital on Dec 23, 2015 because of a recurrence of his melena and fatigue.  At this time, he was noted to be hypotensive to the 00X systolic.  Repeat EGD during this admission due to concern for worsening gastric erosions did not show any ulcer or erosion.  There was also no evidence of active or past bleeding.  The only other notable items were 2 small, solitary AVMs that were not actively bleeding.  At discharge, his Eliquis was held until  PCP follow up and PPI continued.  It was recommended he pursue colonoscopy but patient was unsure if he wanted to have this done so it was not scheduled.  His most recent colonoscopy was 3-4 years ago, which reportedly showed some polyps per patient, and he was told to follow up in 3-5 years.   Today, patient returned to the Emergency Department with a 4 day history of dark stools and lightheadedness when standing.  States his stools are dark with every bowel movement and had 3 episodes of loose stool yesterday.  He denies any bright red blood, no hematuria, any other signs of bleeding.  He reports that he followed up with his PCP and restarted Eliquis about 2 weeks ago.  States he finished his Nystatin course a few days ago but thinks this really helped with his dark stools.  He has not yet followed up with GI for colonoscopy.  Since discharge at end of December and prior to to restarting Eliquis, patient reports normal bowel movements.    On review of systems, patient denies chest pain, shortness of breath, abdominal pain, constipation, fever, chills, dysuria.  He reports melenotic stool, fatigue and weakness, and lightheadedness with standing.  No loss of consciousness.  No night sweats or weight loss.  Meds: No current facility-administered medications for this encounter.   Current Outpatient Prescriptions  Medication Sig Dispense Refill  .  acetaminophen (TYLENOL) 325 MG tablet Take 650 mg by mouth 2 (two) times daily.     Marland Kitchen albuterol (PROVENTIL HFA;VENTOLIN HFA) 108 (90 BASE) MCG/ACT inhaler Inhale 2 puffs into the lungs every 6 (six) hours as needed for shortness of breath.    Marland Kitchen ammonium lactate (LAC-HYDRIN) 12 % lotion Apply 1 application topically daily.    Marland Kitchen apixaban (ELIQUIS) 5 MG TABS tablet Take 5 mg by mouth every 12 (twelve) hours.    Marland Kitchen atorvastatin (LIPITOR) 40 MG tablet Take 1 tablet (40 mg total) by mouth daily. (Patient taking differently: Take 20 mg by mouth at bedtime. ) 90 tablet  3  . cholecalciferol (VITAMIN D) 1000 UNITS tablet Take 1,000 Units by mouth daily.    . cilostazol (PLETAL) 50 MG tablet Take 50 mg by mouth every 12 (twelve) hours.     Marland Kitchen dextrose (GLUTOSE) 40 % GEL Take 1 Tube by mouth daily as needed for low blood sugar.     . diclofenac sodium (VOLTAREN) 1 % GEL Apply 2 g topically 4 (four) times daily. 100 g 0  . divalproex (DEPAKOTE ER) 500 MG 24 hr tablet Take 500 mg by mouth 2 (two) times daily.    . feeding supplement, GLUCERNA SHAKE, (GLUCERNA SHAKE) LIQD Take 237 mLs by mouth 3 (three) times daily between meals.    . finasteride (PROSCAR) 5 MG tablet Take 5 mg by mouth daily.      . furosemide (LASIX) 40 MG tablet Take 80 mg by mouth daily.     Marland Kitchen gabapentin (NEURONTIN) 300 MG capsule Take 300 mg by mouth 2 (two) times daily.     . insulin glargine (LANTUS) 100 UNIT/ML injection Inject 40 Units into the skin daily.    . insulin regular (HUMULIN R,NOVOLIN R) 100 units/mL injection Inject 5-7 Units into the skin 2 (two) times daily before a meal. Per sliding scale    . latanoprost (XALATAN) 0.005 % ophthalmic solution Place 1 drop into both eyes at bedtime.    Marland Kitchen lisinopril (PRINIVIL,ZESTRIL) 20 MG tablet Take 10 mg by mouth daily.    . metoprolol (LOPRESSOR) 50 MG tablet Take 50 mg by mouth 2 (two) times daily.    . Multiple Vitamins-Minerals (MULTIVITAMIN WITH MINERALS) tablet Take 1 tablet by mouth daily.    Marland Kitchen nystatin (MYCOSTATIN) 100000 UNIT/ML suspension Take 5 mLs (500,000 Units total) by mouth 4 (four) times daily. 240 mL 0  . pantoprazole (PROTONIX) 40 MG tablet Take 1 tablet (40 mg total) by mouth daily. 30 tablet 11  . simethicone (MYLICON) 80 MG chewable tablet Chew 80 mg by mouth 3 (three) times daily as needed for flatulence. Take with meals as needed    . tadalafil (CIALIS) 10 MG tablet Take 10 mg by mouth daily as needed for erectile dysfunction.    . Tamsulosin HCl (FLOMAX) 0.4 MG CAPS Take 0.4 mg by mouth daily.      . vitamin B-12  (CYANOCOBALAMIN) 1000 MCG tablet Take 1,000 mcg by mouth daily.      Allergies: Allergies as of 01/23/2016 - Review Complete 01/23/2016  Allergen Reaction Noted  . Penicillins Other (See Comments) 03/11/2010   Past Medical History  Diagnosis Date  . Hyperlipidemia   . HTN (hypertension)     echo 2/11: EF 50-55%, diast dyfxn, severe LVH, inf HK, LAE  . CAD (coronary artery disease)     a.  NSTEMI treated with PCI Feb 2011 with a DES.([facility]);   b. cath 2/11: D1 stents  x 2 ok, AV groove CFX occluded with dist AV CFX filled by L-L collats, RCA occluded, OM2 90-95% (treated with PCI)  . MI (myocardial infarction) ([place])   . PVD (peripheral vascular disease) ([place])   . DM (diabetes mellitus) ([place])   . GERD (gastroesophageal reflux disease)   . Bipolar disorder ([place])   . Prostate cancer [facility])     status post radiation therapy in 2003  . Pulmonary edema   . Atrial fibrillation ([place])   . Respiratory difficulty 06/24/2014    intubated   . Hypertensive emergency 06/24/2014  . CKD (chronic kidney disease), stage III   . Anemia 11/28/2015  . Shortness of breath dyspnea   . Stroke [facility]) 07/2014   Past Surgical History  Procedure Laterality Date  . Femoral bypass  2003  . Esophagogastroduodenoscopy N/A 11/29/2015    Procedure: ESOPHAGOGASTRODUODENOSCOPY (EGD);  Surgeon: Wonda Horner, MD;  Location[facility] END[facility]COPY;  Service: Endoscopy;  Laterality: N/A;  . Esophagogastroduodenoscopy (egd) with propofol Left 12/25/2015    Procedure: ESOPHAGOGASTRODUODENOSCOPY (EGD) WITH PROPOFOL;  Surgeon: Arta Silence, MD;  Location: [facility] ENDOSCOPY;  Service: Endoscopy;  Laterality: Left;   Family History  Problem Relation Age of Onset  . Cancer Mother   . Cancer Father   . Cancer Sister    Social History   Social History  . Marital Status: Divorced    Spouse Name: N/A  . Number of Children: N/A  . Years of Education: N/A   Occupational History  . retired Architect    Social History Main Topics   . Smoking status: Current Every Day Smoker -- 0.50 packs/day for 50 years    Types: Cigarettes  . Smokeless tobacco: Never Used     Comment: He has a 50-pack-year hx of tobacco abuse. Currently, smoking about half a pack a day.  . Alcohol Use: Yes     Comment: Previously drank heavily, and now has a drink every other day or so.  . Drug Use: No  . Sexual Activity: Not on file   Other Topics Concern  . Not on file   Social History Narrative    Review of Systems: Pertinent items are noted in HPI.  Physical Exam: Blood pressure 105/65, pulse 76, temperature 97.8 F (36.6 C), temperature source Oral, resp. rate 14, SpO2 98 %.   General: Well-appearing elderly African American man, lying comfortably in bed in no acute distress HEENT: Normocephalic, atraumatic, moist mucous membranes, oropharynx clear Cardio: Irregularly irregular, no murmurs. Trace lower extremity edema.  Pulm: Lungs clear to auscultation bilaterally with no wheezes or crackles. Normal work of breathing.  Abd: Soft, non-distended, non-tender. Normal bowel sounds.  MSK: normal range of motion Skin: Warm and dry. Mild clubbing of fingernails, brisk capillary refill.  Neuro: Alert and oriented x 3. No focal deficits Psych: Appropriate behavior and affect.   Lab results: Basic Metabolic Panel:  Recent Labs  01/23/16 1107  NA 139  K 3.6  CL 101  CO2 27  GLUCOSE 153*  BUN 32*  CREATININE 2.04*  CALCIUM 8.8*   Liver Function Tests: No results for input(s): AST, ALT, ALKPHOS, BILITOT, PROT, ALBUMIN in the last 72 hours. No results for input(s): LIPASE, AMYLASE in the last 72 hours. No results for input(s): AMMONIA in the last 72 hours. CBC:  Recent Labs  01/23/16 1107  WBC 5.4  NEUTROABS 2.7  HGB 11.2*  HCT 34.3*  MCV 95.0  PLT 209   Cardiac Enzymes: No results for input(s): CKTOTAL,  CKMB, CKMBINDEX, TROPONINI in the last 72 hours. BNP: No results for input(s): PROBNP in the last 72  hours. D-Dimer: No results for input(s): DDIMER in the last 72 hours. CBG: No results for input(s): GLUCAP in the last 72 hours. Hemoglobin A1C: No results for input(s): HGBA1C in the last 72 hours. Fasting Lipid Panel: No results for input(s): CHOL, HDL, LDLCALC, TRIG, CHOLHDL, LDLDIRECT in the last 72 hours. Thyroid Function Tests: No results for input(s): TSH, T4TOTAL, FREET4, T3FREE, THYROIDAB in the last 72 hours. Anemia Panel: No results for input(s): VITAMINB12, FOLATE, FERRITIN, TIBC, IRON, RETICCTPCT in the last 72 hours. Coagulation:  Recent Labs  01/23/16 1107  LABPROT 16.0*  INR 1.27   Urine Drug Screen: Drugs of Abuse     Component Value Date/Time   LABOPIA NONE DETECTED 08/25/2014 1602   COCAINSCRNUR NONE DETECTED 08/25/2014 1602   LABBENZ NONE DETECTED 08/25/2014 1602   AMPHETMU NONE DETECTED 08/25/2014 1602   THCU NONE DETECTED 08/25/2014 1602   LABBARB NONE DETECTED 08/25/2014 1602    Alcohol Level: No results for input(s): ETH in the last 72 hours. Urinalysis: No results for input(s): COLORURINE, LABSPEC, PHURINE, GLUCOSEU, HGBUR, BILIRUBINUR, KETONESUR, PROTEINUR, UROBILINOGEN, NITRITE, LEUKOCYTESUR in the last 72 hours.  Invalid input(s): APPERANCEUR   Imaging results:  Ct Head Wo Contrast  01/23/2016  CLINICAL DATA:  Lightheaded.  Fell last week. EXAM: CT HEAD WITHOUT CONTRAST TECHNIQUE: Contiguous axial images were obtained from the base of the skull through the vertex without intravenous contrast. COMPARISON:  August 25, 2014 FINDINGS: Paranasal sinuses are well aerated. A few inferior mastoid air cells are opacified bilaterally with no overlying bony erosion or soft tissue swelling. There is a depressed fracture of the medial right orbit which is stable and chronic. No acute bony abnormalities are identified. Extracranial soft tissues are within normal limits. No subdural, epidural, or subarachnoid hemorrhage. No mass, mass effect, or midline shift.  Ventricles and sulci are prominent stable. Scattered white matter changes are seen, particularly in the subcortical region on the right on image 20, not appreciated on the previous study. No acute cortical ischemia or infarct is identified. IMPRESSION: 1. No acute cortical ischemia or infarct. No bleed. Subcortical white matter changes on the right, such as on image 20, are age indeterminate but more conspicuous since August 2015. Electronically Signed   By: Dorise Bullion III M.D   On: 01/23/2016 13:32    Other results: EKG: atrial fibrillation, right bundle branch block, lateral Q waves not seen on EKG from 12/23/15  Assessment & Plan by Problem: Active Problems:   Lower GI bleed  80 y.o. man with past medical history of CVA, atrial fibrillation (CHADSVASc 8) on Eliquis, HFrEF (EF 40-45% in 2015), coronary artery disease, CKD stage 3, hypertension, peripheral vascular disease, and history of lung cancer (status post radiation) who presents due to recurrent melenotic stools.  Melena: chronic and recurrent problem for patient.  FOBT was positive as it has been in the past.  His hemoglobin is stable at 11.2 and the rest of his exam is unremarkable.  He has had 2 EGD's that have not revealed evidence of active bleeding, ulcers, or erosions to produce a source of bleeding.  He likely needs outpatient colonoscopy and then possible capsule endoscopy as previously recommended. - NS IV fluids at 138m/hr x 16 hours - hold Eliquis - continue Protonix - restart Nystatin - consider GI consult in the morning  Atrial Fibrillation: home meds are Eliquis '5mg'$  daily and metoprolol  $'50mg'P$  BID  - holding home meds due to melena and lower BP  History of CHF: On Lasix 80 mg daily, lisinopril 10 mg daily and metoprolol 50 mg twice daily at home.  No evidence of volume overload on exam.  Last EF 40-45% in August 2015 - hold home meds with lower BP  Coronary Artery Disease: [place], on atorvastatin '40mg'$  daily -  continue home meds  Diabetes Mellitus:  On Lantus 40 units each morning and Humulin 5-7 units twice daily before meals according to sliding scale at home. Most recent A1c of 10.4% in Dec 2016.  - Sliding scale insulin with HS coverage - continue Lantus 35 units QAM  FEN: Fluids: NS at 100 ml/hr  Electrolytes: stable, replete as needed Nutrition: carb modified  Code: Full  Dispo: Disposition is deferred at this time, awaiting improvement of current medical problems.   The patient does have a current PCP Jilda Panda, MD) and does not need an [facility] hospital follow-up appointment after discharge.  The patient does not have transportation limitations that hinder transportation to clinic appointments.  Signed: Jule Ser, DO 01/23/2016, 4:32 PM

## 2016-01-23 NOTE — ED Notes (Signed)
Patient states he presents today with complaints of Black stools. Patient denies any SOB, n/v with black stools. Patient denies every needing a blood transfusion.Patient alert and and oriented x4  arrival able to move all extremis.

## 2016-01-23 NOTE — Progress Notes (Signed)
NURSING PROGRESS NOTE  BRANDY KABAT [id number] Admission Data: 01/23/2016 8:07 PM Attending Provider: Annia Belt, MD NWG:[id number],QMV, MD Code Status: Full Code  [name] is a 80 y.o. male patient admitted from ED:  -No acute distress noted.  -No complaints of shortness of breath.  -No complaints of chest pain.   Cardiac Monitoring: Box # 19 in place. Cardiac monitor yields: atrial fibrillation  Blood pressure 121/73, pulse 87, temperature 97.5 F (36.4 C), temperature source Oral, resp. rate 19, height 6' (1.829 m), SpO2 98 %.   IV Fluids:  IV in place, occlusive dsg intact without redness, IV cath forearm right, condition patent and no redness normal saline.   Allergies:  Penicillins  Past Medical History:   has a past medical history of Hyperlipidemia; HTN (hypertension); CAD (coronary artery disease); MI (myocardial infarction) ([place]); PVD (peripheral vascular disease) ([place]); DM (diabetes mellitus) ([place]); GERD (gastroesophageal reflux disease); Bipolar disorder ([place]); Prostate cancer ([place]); Pulmonary edema; Atrial fibrillation ([place]); Respiratory difficulty (06/24/2014); Hypertensive emergency (06/24/2014); CKD (chronic kidney disease), stage III; Anemia (11/28/2015); Shortness of breath dyspnea; and Stroke ([place]) (07/2014).  Past Surgical History:   has past surgical history that includes Femoral bypass (2003); Esophagogastroduodenoscopy (N/A, 11/29/2015); and Esophagogastroduodenoscopy (egd) with propofol (Left, 12/25/2015).  Social History:   reports that he has been smoking Cigarettes.  He has a 25 pack-year smoking history. He has never used smokeless tobacco. He reports that he drinks alcohol. He reports that he does not use illicit drugs.  Skin: Bumps in sacrum.  Patient/Family orientated to room. Information packet given to patient/family. Admission inpatient armband information verified with patient/family to include name and date of birth and placed on  patient arm. Side rails up x 2, fall assessment and education completed with patient/family. Patient/family able to verbalize understanding of risk associated with falls and verbalized understanding to call for assistance before getting out of bed. Call light within reach. Patient/family able to voice and demonstrate understanding of unit orientation instructions.    Will continue to evaluate and treat per MD orders.

## 2016-01-23 NOTE — ED Notes (Signed)
PT asleep at this time. On full monitor. Call bell in reach

## 2016-01-24 DIAGNOSIS — E1122 Type 2 diabetes mellitus with diabetic chronic kidney disease: Secondary | ICD-10-CM

## 2016-01-24 DIAGNOSIS — B9689 Other specified bacterial agents as the cause of diseases classified elsewhere: Secondary | ICD-10-CM | POA: Diagnosis not present

## 2016-01-24 DIAGNOSIS — Z923 Personal history of irradiation: Secondary | ICD-10-CM

## 2016-01-24 DIAGNOSIS — I2510 Atherosclerotic heart disease of native coronary artery without angina pectoris: Secondary | ICD-10-CM | POA: Diagnosis not present

## 2016-01-24 DIAGNOSIS — K921 Melena: Secondary | ICD-10-CM | POA: Diagnosis not present

## 2016-01-24 DIAGNOSIS — R0902 Hypoxemia: Secondary | ICD-10-CM | POA: Diagnosis not present

## 2016-01-24 DIAGNOSIS — Z7901 Long term (current) use of anticoagulants: Secondary | ICD-10-CM | POA: Diagnosis not present

## 2016-01-24 DIAGNOSIS — L0231 Cutaneous abscess of buttock: Secondary | ICD-10-CM | POA: Diagnosis not present

## 2016-01-24 DIAGNOSIS — I482 Chronic atrial fibrillation: Secondary | ICD-10-CM | POA: Diagnosis not present

## 2016-01-24 DIAGNOSIS — K922 Gastrointestinal hemorrhage, unspecified: Secondary | ICD-10-CM | POA: Diagnosis not present

## 2016-01-24 DIAGNOSIS — Z8673 Personal history of transient ischemic attack (TIA), and cerebral infarction without residual deficits: Secondary | ICD-10-CM

## 2016-01-24 DIAGNOSIS — Z85118 Personal history of other malignant neoplasm of bronchus and lung: Secondary | ICD-10-CM

## 2016-01-24 DIAGNOSIS — I130 Hypertensive heart and chronic kidney disease with heart failure and stage 1 through stage 4 chronic kidney disease, or unspecified chronic kidney disease: Secondary | ICD-10-CM

## 2016-01-24 DIAGNOSIS — I5022 Chronic systolic (congestive) heart failure: Secondary | ICD-10-CM

## 2016-01-24 DIAGNOSIS — E11649 Type 2 diabetes mellitus with hypoglycemia without coma: Secondary | ICD-10-CM | POA: Diagnosis not present

## 2016-01-24 DIAGNOSIS — N183 Chronic kidney disease, stage 3 (moderate): Secondary | ICD-10-CM

## 2016-01-24 DIAGNOSIS — Z794 Long term (current) use of insulin: Secondary | ICD-10-CM

## 2016-01-24 LAB — COMPREHENSIVE METABOLIC PANEL
ALK PHOS: 42 U/L (ref 38–126)
ALT: 8 U/L — ABNORMAL LOW (ref 17–63)
ANION GAP: 6 (ref 5–15)
AST: 16 U/L (ref 15–41)
Albumin: 2.6 g/dL — ABNORMAL LOW (ref 3.5–5.0)
BILIRUBIN TOTAL: 0.4 mg/dL (ref 0.3–1.2)
BUN: 30 mg/dL — ABNORMAL HIGH (ref 6–20)
CALCIUM: 8.4 mg/dL — AB (ref 8.9–10.3)
CO2: 31 mmol/L (ref 22–32)
Chloride: 100 mmol/L — ABNORMAL LOW (ref 101–111)
Creatinine, Ser: 1.97 mg/dL — ABNORMAL HIGH (ref 0.61–1.24)
GFR, EST AFRICAN AMERICAN: 36 mL/min — AB (ref >60)
GFR, EST NON AFRICAN AMERICAN: 31 mL/min — AB (ref >60)
Glucose, Bld: 253 mg/dL — ABNORMAL HIGH (ref 65–99)
POTASSIUM: 4 mmol/L (ref 3.5–5.1)
Sodium: 137 mmol/L (ref 135–145)
TOTAL PROTEIN: 6.7 g/dL (ref 6.5–8.1)

## 2016-01-24 LAB — GLUCOSE, CAPILLARY
GLUCOSE-CAPILLARY: 131 mg/dL — AB (ref 65–99)
Glucose-Capillary: 238 mg/dL — ABNORMAL HIGH (ref 65–99)
Glucose-Capillary: 236 mg/dL — ABNORMAL HIGH (ref 65–99)

## 2016-01-24 LAB — CBC
HEMATOCRIT: 33.4 % — AB (ref 39.0–52.0)
Hemoglobin: 11 g/dL — ABNORMAL LOW (ref 13.0–17.0)
MCH: 31.6 pg (ref 26.0–34.0)
MCHC: 32.9 g/dL (ref 30.0–36.0)
MCV: 96 fL (ref 78.0–100.0)
Platelets: 214 10*3/uL (ref 150–400)
RBC: 3.48 MIL/uL — ABNORMAL LOW (ref 4.22–5.81)
RDW: 14.5 % (ref 11.5–15.5)
WBC: 5.2 10*3/uL (ref 4.0–10.5)

## 2016-01-24 MED ORDER — MAGNESIUM CITRATE PO SOLN
1.0000 | Freq: Once | ORAL | Status: AC
  Administered 2016-01-24: 1 via ORAL
  Filled 2016-01-24: qty 296

## 2016-01-24 MED ORDER — INSULIN ASPART 100 UNIT/ML ~~LOC~~ SOLN: 0.0000 [IU] | Freq: Every day | SUBCUTANEOUS | Status: DC

## 2016-01-24 MED ORDER — PEG 3350-KCL-NA BICARB-NACL 420 G PO SOLR
4000.0000 mL | Freq: Once | ORAL | Status: AC
  Administered 2016-01-24: 4000 mL via ORAL
  Filled 2016-01-24: qty 4000

## 2016-01-24 MED ORDER — INSULIN ASPART 100 UNIT/ML ~~LOC~~ SOLN
0.0000 [IU] | Freq: Three times a day (TID) | SUBCUTANEOUS | Status: DC
  Administered 2016-01-24 (×2): 3 [IU] via SUBCUTANEOUS
  Administered 2016-01-25: 1 [IU] via SUBCUTANEOUS
  Administered 2016-01-25: 3 [IU] via SUBCUTANEOUS
  Administered 2016-01-27 (×2): 2 [IU] via SUBCUTANEOUS

## 2016-01-24 NOTE — Progress Notes (Signed)
Pt BP 159/103 HR 117. Pt is asymptomatic. MD notified. No new orders at this time. Will continue to monitor.

## 2016-01-24 NOTE — Progress Notes (Signed)
   01/24/16 0900  Clinical Encounter Type  Visited With Patient  Visit Type Initial  Referral From Nurse  Spiritual Encounters  Spiritual Needs Literature  Stress Factors  Patient Stress Factors None identified  Chaplain provided AD paperwork. Patient wants to have daughter fill it out when she comes to visit, and then he will contact us again.

## 2016-01-24 NOTE — Progress Notes (Signed)
Subjective: Patient seen and examined this morning.  No acute events overnight since admission.  Nurse reports patient had a normal, brown bowel movement.  Patient not having any abdominal pain, lightheadedness, chest pain, shortness of breath.   Objective: Vital signs in last 24 hours: Filed Vitals:   01/23/16 1930 01/23/16 2309 01/24/16 0435 01/24/16 0700  BP: 121/73 127/54 165/76   Pulse: 87 90 61   Temp: 97.5 F (36.4 C) 97.8 F (36.6 C) 98.8 F (37.1 C)   TempSrc: Oral Oral Oral   Resp: '19 18 16   '$ Height:    6' (1.829 m)  Weight:    195 lb (88.451 kg)  SpO2: 98% 97% 99%    Weight change:   Intake/Output Summary (Last 24 hours) at 01/24/16 1207 Last data filed at 01/24/16 0926  Gross per 24 hour  Intake    460 ml  Output    400 ml  Net     60 ml   General: sleeping in bed, easily aroused, no distress HEENT: EOMI, no scleral icterus Cardiac: irregularly irregular rhythm, normal rate Pulm: clear to auscultation bilaterally, moving normal volumes of air Abd: soft, nontender, nondistended Ext: warm and well perfused, left leg slightly larger than right Neuro: alert and oriented X3, no focal deficits  Lab Results: Basic Metabolic Panel:  Recent Labs Lab 01/23/16 1107 01/24/16 0607  NA 139 137  K 3.6 4.0  CL 101 100*  CO2 27 31  GLUCOSE 153* 253*  BUN 32* 30*  CREATININE 2.04* 1.97*  CALCIUM 8.8* 8.4*   Liver Function Tests:  Recent Labs Lab 01/24/16 0607  AST 16  ALT 8*  ALKPHOS 42  BILITOT 0.4  PROT 6.7  ALBUMIN 2.6*   No results for input(s): LIPASE, AMYLASE in the last 168 hours. No results for input(s): AMMONIA in the last 168 hours. CBC:  Recent Labs Lab 01/23/16 1107 01/24/16 0607  WBC 5.4 5.2  NEUTROABS 2.7  --   HGB 11.2* 11.0*  HCT 34.3* 33.4*  MCV 95.0 96.0  PLT 209 214   Cardiac Enzymes: No results for input(s): CKTOTAL, CKMB, CKMBINDEX, TROPONINI in the last 168 hours. BNP: No results for input(s): PROBNP in the last 168  hours. D-Dimer: No results for input(s): DDIMER in the last 168 hours. CBG: No results for input(s): GLUCAP in the last 168 hours. Hemoglobin A1C: No results for input(s): HGBA1C in the last 168 hours. Fasting Lipid Panel: No results for input(s): CHOL, HDL, LDLCALC, TRIG, CHOLHDL, LDLDIRECT in the last 168 hours. Thyroid Function Tests: No results for input(s): TSH, T4TOTAL, FREET4, T3FREE, THYROIDAB in the last 168 hours. Coagulation:  Recent Labs Lab 01/23/16 1107  LABPROT 16.0*  INR 1.27   Anemia Panel: No results for input(s): VITAMINB12, FOLATE, FERRITIN, TIBC, IRON, RETICCTPCT in the last 168 hours. Urine Drug Screen: Drugs of Abuse     Component Value Date/Time   LABOPIA NONE DETECTED 08/25/2014 1602   COCAINSCRNUR NONE DETECTED 08/25/2014 1602   LABBENZ NONE DETECTED 08/25/2014 1602   AMPHETMU NONE DETECTED 08/25/2014 1602   THCU NONE DETECTED 08/25/2014 1602   LABBARB NONE DETECTED 08/25/2014 1602    Alcohol Level: No results for input(s): ETH in the last 168 hours. Urinalysis: No results for input(s): COLORURINE, LABSPEC, PHURINE, GLUCOSEU, HGBUR, BILIRUBINUR, KETONESUR, PROTEINUR, UROBILINOGEN, NITRITE, LEUKOCYTESUR in the last 168 hours.  Invalid input(s): APPERANCEUR   Micro Results: No results found for this or any previous visit (from the past 240 hour(s)). Studies/Results: Ct  Head Wo Contrast  01/23/2016  CLINICAL DATA:  Lightheaded.  Fell last week. EXAM: CT HEAD WITHOUT CONTRAST TECHNIQUE: Contiguous axial images were obtained from the base of the skull through the vertex without intravenous contrast. COMPARISON:  August 25, 2014 FINDINGS: Paranasal sinuses are well aerated. A few inferior mastoid air cells are opacified bilaterally with no overlying bony erosion or soft tissue swelling. There is a depressed fracture of the medial right orbit which is stable and chronic. No acute bony abnormalities are identified. Extracranial soft tissues are within  normal limits. No subdural, epidural, or subarachnoid hemorrhage. No mass, mass effect, or midline shift. Ventricles and sulci are prominent stable. Scattered white matter changes are seen, particularly in the subcortical region on the right on image 20, not appreciated on the previous study. No acute cortical ischemia or infarct is identified. IMPRESSION: 1. No acute cortical ischemia or infarct. No bleed. Subcortical white matter changes on the right, such as on image 20, are age indeterminate but more conspicuous since August 2015. Electronically Signed   By: Dorise Bullion III M.D   On: 01/23/2016 13:32   Medications:  Scheduled Meds: . atorvastatin  40 mg Oral Daily  . cholecalciferol  1,000 Units Oral Daily  . divalproex  500 mg Oral BID  . feeding supplement (GLUCERNA SHAKE)  237 mL Oral TID BM  . gabapentin  300 mg Oral BID  . Influenza vac split quadrivalent PF  0.5 mL Intramuscular Tomorrow-1000  . insulin glargine  35 Units Subcutaneous Daily  . latanoprost  1 drop Both Eyes QHS  . nystatin  5 mL Oral QID  . pantoprazole  40 mg Oral BID  . pneumococcal 23 valent vaccine  0.5 mL Intramuscular Tomorrow-1000  . sodium chloride flush  3 mL Intravenous Q12H   Continuous Infusions:  PRN Meds:.albuterol Assessment/Plan: Principal Problem:   GI bleed Active Problems:   DM (diabetes mellitus), type 2 with renal complications (HCC)   Hyperlipidemia associated with type 2 diabetes mellitus ([place])   Essential hypertension   Coronary atherosclerosis   PVD   Atrial fibrillation (HCC)   Tobacco abuse   Diastolic CHF, chronic (HCC)   CKD (chronic kidney disease), stage III   Stroke ([place])  80 y.o. man with past medical history of CVA, atrial fibrillation (CHADSVASc 8) on Eliquis, HFrEF (EF 40-45% in 2015), coronary artery disease, CKD stage 3, hypertension, peripheral vascular disease, and history of lung cancer (status post radiation) who presents due to recurrent melenotic  stools.  Recurrent Melena: patient reports no abdominal pain this morning or further bleeding from other source.  Nursing notes a brown bowel movement yesterday.  His Eliquis has been held since admission and his hemoglobin remains stable at 11 this morning.  Patient wanting to have colonoscopy while he is in the hospital. - consult [facility] to determine if they want to set up colonoscopy inpatient versus outpatient - hold Eliquis - continue PPI BID - continue Nystatin  Hypertension: patient blood pressure soft at admission.  Was also seen at the [facility] emergency department on 01/09/2016 where he was diagnosed with low blood pressure and told to hold his home Lasix and Lisinopril x 1 week and follow up with PCP.  He reportedly restarted them shortly after and did not see his PCP.  I wonder if his lightheadedness has more to do with hypotension as opposed to symptomatic anemia.   - continue to hold home hypertension medications  Atrial Fibrillation: home meds are Eliquis '5mg'$   daily and metoprolol '50mg'$  BID  - holding home meds due to melena and lower BP  History of CHF: On Lasix 80 mg daily, lisinopril 10 mg daily and metoprolol 50 mg twice daily at home. No evidence of volume overload on exam. Last EF 40-45% in August 2015 - hold home meds with lower BP  Coronary Artery Disease: Stabe, on atorvastatin '40mg'$  daily - continue home meds  Diabetes Mellitus: On Lantus 40 units each morning and Humulin 5-7 units twice daily before meals according to sliding scale at home. Most recent A1c of 10.4% in Dec 2016.  - glucose from CMP elevated to 253 this morning - start Sliding scale insulin with HS coverage - continue Lantus 35 units QAM  FEN: Fluids: none  Electrolytes: stable, replete as needed Nutrition: carb modified  Code: Full  Dispo: Disposition is deferred at this time, awaiting improvement of current medical problems.    The patient does have a current PCP Jilda Panda, MD) and  does not need an [facility] hospital follow-up appointment after discharge.  The patient does not have transportation limitations that hinder transportation to clinic appointments.  .Services Needed at time of discharge: Y = Yes, Blank = No PT:   OT:   RN:   Equipment:   Other:       Jule Ser, DO 01/24/2016, 12:07 PM

## 2016-01-24 NOTE — Progress Notes (Signed)
Notified Dr Jonathon Bellows of 8 beat run VT.asymptamotic was up to toilet at time

## 2016-01-24 NOTE — Consult Note (Signed)
Referring Provider:  Med Teaching Service (Dr. Murriel Hopper, attending) Primary Care Physician:  Jilda Panda, MD Primary Gastroenterologist:  Dr. Penelope Coop  Reason for Consultation:  Heme positive stool, recurrent melena on Eloquis  HPI: [name] is a 80 y.o. male who has had 3 separate admissions for dark stools in the past 2 months, while on Eloquis in light of a prior history of CVA, A. fib, diabetes, and heart failure.  On each of the prior admissions, he had endoscopic evaluation, the first time showing some mild prepyloric erosions, the second time showing a solitary gastric AVM. The patient is not on any ulcerogenic medications, but as noted, is on chronic anticoagulation. At time of admission, he was on Protonix 40 mg daily.  The day prior to admission, he had multiple, loose dark stools. His hemoglobin, however, was not lower than baseline, and in fact, was slightly higher than it was at time of hospital discharge a month ago. His BUN was unchanged from baseline, which is to say, slightly elevated in association with moderate chronic renal insufficiency.  The patient states that he had a colonoscopy at the [facility] about 3 years ago, which apparently showed some small polyps.   The patient is actually scheduled to see Dr. Penelope Coop in our office during the coming week, but in the meantime, we are asked to see the patient to discuss timing of updated colonoscopy, as had been planned at the time of his last admission.   Past Medical History  Diagnosis Date  . Hyperlipidemia   . HTN (hypertension)     echo 2/11: EF 50-55%, diast dyfxn, severe LVH, inf HK, LAE  . CAD (coronary artery disease)     a.  NSTEMI treated with PCI Feb 2011 with a DES.([facility]);   b. cath 2/11: D1 stents x 2 ok, AV groove CFX occluded with dist AV CFX filled by L-L collats, RCA occluded, OM2 90-95% (treated with PCI)  . MI (myocardial infarction) ([place])   . PVD (peripheral  vascular disease) ([place])   . DM (diabetes mellitus) ([place])   . GERD (gastroesophageal reflux disease)   . Bipolar disorder ([place])   . Prostate cancer [facility])     status post radiation therapy in 2003  . Pulmonary edema   . Atrial fibrillation ([place])   . Respiratory difficulty 06/24/2014    intubated   . Hypertensive emergency 06/24/2014  . CKD (chronic kidney disease), stage III   . Anemia 11/28/2015  . Shortness of breath dyspnea   . Stroke [facility]) 07/2014    Past Surgical History  Procedure Laterality Date  . Femoral bypass  2003  . Esophagogastroduodenoscopy N/A 11/29/2015    Procedure: ESOPHAGOGASTRODUODENOSCOPY (EGD);  Surgeon: Wonda Horner, MD;  Location: [facility] ENDOSCOPY;  Service: Endoscopy;  Laterality: N/A;  . Esophagogastroduodenoscopy (egd) with propofol Left 12/25/2015    Procedure: ESOPHAGOGASTRODUODENOSCOPY (EGD) WITH PROPOFOL;  Surgeon: Arta Silence, MD;  Location: [facility] ENDOSCOPY;  Service: Endoscopy;  Laterality: Left;    Prior to Admission medications   Medication Sig Start Date End Date Taking? Authorizing Provider  acetaminophen (TYLENOL) 325 MG tablet Take 650 mg by mouth 2 (two) times daily.    Yes Historical Provider, MD  albuterol (PROVENTIL HFA;VENTOLIN HFA) 108 (90 BASE) MCG/ACT inhaler Inhale 2 puffs into the lungs every 6 (six) hours as needed for shortness of breath.   Yes Historical Provider, MD  ammonium lactate (LAC-HYDRIN) 12 % lotion Apply 1 application topically daily.   Yes Historical Provider,  MD  apixaban (ELIQUIS) 5 MG TABS tablet Take 5 mg by mouth every 12 (twelve) hours.   Yes Historical Provider, MD  atorvastatin (LIPITOR) 40 MG tablet Take 1 tablet (40 mg total) by mouth daily. Patient taking differently: Take 20 mg by mouth at bedtime.  08/01/14  Yes Sherren Mocha, MD  cholecalciferol (VITAMIN D) 1000 UNITS tablet Take 1,000 Units by mouth daily.   Yes Historical Provider, MD  cilostazol (PLETAL) 50 MG tablet Take 50 mg by mouth every 12 (twelve) hours.     Yes Historical Provider, MD  dextrose (GLUTOSE) 40 % GEL Take 1 Tube by mouth daily as needed for low blood sugar.    Yes Historical Provider, MD  diclofenac sodium (VOLTAREN) 1 % GEL Apply 2 g topically 4 (four) times daily. 11/29/15  Yes Alexa Sherral Hammers, MD  divalproex (DEPAKOTE ER) 500 MG 24 hr tablet Take 500 mg by mouth 2 (two) times daily.   Yes Historical Provider, MD  feeding supplement, GLUCERNA SHAKE, (GLUCERNA SHAKE) LIQD Take 237 mLs by mouth 3 (three) times daily between meals.   Yes Historical Provider, MD  finasteride (PROSCAR) 5 MG tablet Take 5 mg by mouth daily.     Yes Historical Provider, MD  furosemide (LASIX) 40 MG tablet Take 80 mg by mouth daily.    Yes Historical Provider, MD  gabapentin (NEURONTIN) 300 MG capsule Take 300 mg by mouth 2 (two) times daily.    Yes Historical Provider, MD  insulin glargine (LANTUS) 100 UNIT/ML injection Inject 40 Units into the skin daily. 07/20/14  Yes Geradine Girt, DO  insulin regular (HUMULIN R,NOVOLIN R) 100 units/mL injection Inject 5-7 Units into the skin 2 (two) times daily before a meal. Per sliding scale   Yes Historical Provider, MD  latanoprost (XALATAN) 0.005 % ophthalmic solution Place 1 drop into both eyes at bedtime.   Yes Historical Provider, MD  lisinopril (PRINIVIL,ZESTRIL) 20 MG tablet Take 10 mg by mouth daily.   Yes Historical Provider, MD  metoprolol (LOPRESSOR) 50 MG tablet Take 50 mg by mouth 2 (two) times daily.   Yes Historical Provider, MD  Multiple Vitamins-Minerals (MULTIVITAMIN WITH MINERALS) tablet Take 1 tablet by mouth daily.   Yes Historical Provider, MD  nystatin (MYCOSTATIN) 100000 UNIT/ML suspension Take 5 mLs (500,000 Units total) by mouth 4 (four) times daily. 11/29/15  Yes Alexa Sherral Hammers, MD  pantoprazole (PROTONIX) 40 MG tablet Take 1 tablet (40 mg total) by mouth daily. 11/29/15  Yes Alexa Sherral Hammers, MD  simethicone (MYLICON) 80 MG chewable tablet Chew 80 mg by mouth 3 (three) times daily as  needed for flatulence. Take with meals as needed   Yes Historical Provider, MD  tadalafil (CIALIS) 10 MG tablet Take 10 mg by mouth daily as needed for erectile dysfunction.   Yes Historical Provider, MD  Tamsulosin HCl (FLOMAX) 0.4 MG CAPS Take 0.4 mg by mouth daily.     Yes Historical Provider, MD  vitamin B-12 (CYANOCOBALAMIN) 1000 MCG tablet Take 1,000 mcg by mouth daily.   Yes Historical Provider, MD    Current Facility-Administered Medications  Medication Dose Route Frequency Provider Last Rate Last Dose  . albuterol (PROVENTIL) (2.5 MG/3ML) 0.083% nebulizer solution 2.5 mg  2.5 mg Nebulization Q6H PRN Annia Belt, MD      . atorvastatin (LIPITOR) tablet 40 mg  40 mg Oral Daily Juliet Rude, MD   40 mg at 01/24/16 0958  . cholecalciferol (VITAMIN D) tablet 1,000 Units  1,000 Units Oral Daily Juliet Rude, MD   1,000 Units at 01/24/16 [phone]  . divalproex (DEPAKOTE ER) 24 hr tablet 500 mg  500 mg Oral BID Juliet Rude, MD   500 mg at 01/23/16 2206  . feeding supplement (GLUCERNA SHAKE) (GLUCERNA SHAKE) liquid 237 mL  237 mL Oral TID BM Carly J Rivet, MD   237 mL at 01/24/16 1304  . gabapentin (NEURONTIN) capsule 300 mg  300 mg Oral BID Juliet Rude, MD   300 mg at 01/24/16 0958  . Influenza vac split quadrivalent PF (FLUARIX) injection 0.5 mL  0.5 mL Intramuscular Tomorrow-1000 Annia Belt, MD   0.5 mL at 01/24/16 0959  . insulin aspart (novoLOG) injection 0-5 Units  0-5 Units Subcutaneous QHS Jule Ser, DO      . insulin aspart (novoLOG) injection 0-9 Units  0-9 Units Subcutaneous TID WC Jule Ser, DO   3 Units at 01/24/16 1301  . insulin glargine (LANTUS) injection 35 Units  35 Units Subcutaneous Daily Jule Ser, DO   35 Units at 01/24/16 1001  . latanoprost (XALATAN) 0.005 % ophthalmic solution 1 drop  1 drop Both Eyes QHS Juliet Rude, MD   1 drop at 01/23/16 2207  . magnesium citrate solution 1 Bottle  1 Bottle Oral Once Ronald Lobo, MD      .  nystatin (MYCOSTATIN) 100000 UNIT/ML suspension 500,000 Units  5 mL Oral QID Juliet Rude, MD   500,000 Units at 01/24/16 1304  . pantoprazole (PROTONIX) EC tablet 40 mg  40 mg Oral BID Juliet Rude, MD   40 mg at 01/24/16 0958  . pneumococcal 23 valent vaccine (PNU-IMMUNE) injection 0.5 mL  0.5 mL Intramuscular Tomorrow-1000 Annia Belt, MD   0.5 mL at 01/24/16 1000  . polyethylene glycol-electrolytes (NuLYTELY/GoLYTELY) solution 4,000 mL  4,000 mL Oral Once Ronald Lobo, MD      . sodium chloride flush (NS) 0.9 % injection 3 mL  3 mL Intravenous Q12H Juliet Rude, MD   3 mL at 01/24/16 1000    Allergies as of 01/23/2016 - Review Complete 01/23/2016  Allergen Reaction Noted  . Penicillins Other (See Comments) 03/11/2010    Family History  Problem Relation Age of Onset  . Cancer Mother   . Cancer Father   . Cancer Sister     Social History   Social History  . Marital Status: Divorced    Spouse Name: N/A  . Number of Children: N/A  . Years of Education: N/A   Occupational History  . retired Architect    Social History Main Topics  . Smoking status: Current Every Day Smoker -- 0.50 packs/day for 50 years    Types: Cigarettes  . Smokeless tobacco: Never Used     Comment: He has a 50-pack-year hx of tobacco abuse. Currently, smoking about half a pack a day.  . Alcohol Use: Yes     Comment: Previously drank heavily, and now has a drink every other day or so.  . Drug Use: No  . Sexual Activity: Not on file   Other Topics Concern  . Not on file   Social History Narrative    Review of Systems:  The patient has minimal residual from his previous stroke several years ago, including some numbness in his right fingers and slight weakness of his right leg, causing him to use a cane for ambulation. Also, has slight difficulty with oropharyngeal dysphagia; he has to be careful  how he eats, or it can go down the wrong way. He has had a recent cough due to an upper rest  for infection, mildly productive.   No chronic shortness of breath or COPD. Remote history of MI but no current exertional chest pain. Appetite is good; had lost quite a bit of weight a couple of months ago but is gaining it back. No skin problems, no joint problems.  Physical Exam: Vital signs in last 24 hours: Temp:  [97.5 F (36.4 C)-98.8 F (37.1 C)] 98.8 F (37.1 C) (01/27 0435) Pulse Rate:  [61-90] 81 (01/27 1526) Resp:  [14-19] 14 (01/27 1526) BP: (100-165)/(54-76) 100/60 mmHg (01/27 1526) SpO2:  [93 %-99 %] 93 % (01/27 1526) Weight:  [88.451 kg (195 lb)] 88.451 kg (195 lb) (01/27 0700) Last BM Date: 01/22/16 General:   Alert,  Well-developed, well-nourished, pleasant and cooperative in NAD Head:  Normocephalic and atraumatic. Eyes:  Sclera clear, no icterus.   Conjunctiva pale. Mouth:   No ulcerations or lesions.  Oropharynx pink & moist. Neck:   No masses or thyromegaly. Lungs:  Clear throughout to auscuother than the soft expiratory wheeze on the left anterior lung field. No evident respiratory distress. Heart:   Regular rate and rhythm; no murmurs, clicks, rubs,  or gallops. Abdomen:  Soft, nontender, nontympanitic, and nondistended. No masses, hepatosplenomegaly or ventral hernias noted. Normal bowel sounds, without bruits, guarding, or rebound.   Rectal:  not performed, was heme positive on this admission although he did have what is reported as a normal, brown stool this morning    Msk:   Symmetrical without gross deformities. Pulses:  Normal radial pulse is noted. Extremities:   Without cyanosis or edema. Neurologic:  Alert and coherent;  grossly normal neurologically. No overt focal neurologic deficits, although not formally tested.  Skin:  Intact without significant lesions or rashes. Cervical Nodes:  No significant cervical adenopathy. Psych:   Alert and cooperative. Normal mood and affect.  Intake/Output from previous day: 01/26 0701 - 01/27 0700 In: 460  [P.O.:460] Out: -  Intake/Output this shift: Total I/O In: -  Out: 400 [Urine:400]  Lab Results:  Recent Labs  01/23/16 1107 01/24/16 0607  WBC 5.4 5.2  HGB 11.2* 11.0*  HCT 34.3* 33.4*  PLT 209 214   BMET  Recent Labs  01/23/16 1107 01/24/16 0607  NA 139 137  K 3.6 4.0  CL 101 100*  CO2 27 31  GLUCOSE 153* 253*  BUN 32* 30*  CREATININE 2.04* 1.97*  CALCIUM 8.8* 8.4*   LFT  Recent Labs  01/24/16 0607  PROT 6.7  ALBUMIN 2.6*  AST 16  ALT 8*  ALKPHOS 42  BILITOT 0.4   PT/INR  Recent Labs  01/23/16 1107  LABPROT 16.0*  INR 1.27    Studies/Results: Ct Head Wo Contrast  01/23/2016  CLINICAL DATA:  Lightheaded.  Fell last week. EXAM: CT HEAD WITHOUT CONTRAST TECHNIQUE: Contiguous axial images were obtained from the base of the skull through the vertex without intravenous contrast. COMPARISON:  August 25, 2014 FINDINGS: Paranasal sinuses are well aerated. A few inferior mastoid air cells are opacified bilaterally with no overlying bony erosion or soft tissue swelling. There is a depressed fracture of the medial right orbit which is stable and chronic. No acute bony abnormalities are identified. Extracranial soft tissues are within normal limits. No subdural, epidural, or subarachnoid hemorrhage. No mass, mass effect, or midline shift. Ventricles and sulci are prominent stable. Scattered white matter changes are  seen, particularly in the subcortical region on the right on image 20, not appreciated on the previous study. No acute cortical ischemia or infarct is identified. IMPRESSION: 1. No acute cortical ischemia or infarct. No bleed. Subcortical white matter changes on the right, such as on image 20, are age indeterminate but more conspicuous since August 2015. Electronically Signed   By: Dorise Bullion III M.D   On: 01/23/2016 13:32    Impression: 1.  history of recurrent dark stools while on anticoagulation 2. Hemoccult-positive, without current drop in  hemoglobin compared to a month ago 3. History of A. fib, CHF, MI, CVA   PLAN:  I feel it would be prudent and appropriate to proceed with colonoscopy on this admission, so that we have, as soon as possible, information to help guide the appropriateness of resumption of anticoagulation therapy, keeping in mind that this patient is at fairly high risk for complications without anticoagulation in view of his medical history. The purpose, risks, and alternatives of colonoscopic evaluation in the setting were discussed with the patient and he is agreeable to proceed, so I have scheduled that for tomorrow morning.  I have also explained to him that we may want to proceed with a capsule endoscopy on this admission, if the colonoscopy is negative.      Burke V  01/24/2016, 4:10 PM   Pager [phone] If no answer or after 5 PM call [phone]

## 2016-01-24 NOTE — Progress Notes (Signed)
Patient c/o of "bump" on buttocks. Upon assessment, patient appears to have a possible abscess on right buttock. Will pass along to day shift and continue to monitor.

## 2016-01-25 ENCOUNTER — Encounter (HOSPITAL_COMMUNITY): Payer: Self-pay | Admitting: *Deleted

## 2016-01-25 ENCOUNTER — Encounter (HOSPITAL_COMMUNITY)
Admission: EM | Disposition: A | Discharge status: Home Health | Payer: Self-pay | Source: Home / Self Care | Attending: Emergency Medicine

## 2016-01-25 DIAGNOSIS — I2510 Atherosclerotic heart disease of native coronary artery without angina pectoris: Secondary | ICD-10-CM | POA: Diagnosis not present

## 2016-01-25 DIAGNOSIS — I482 Chronic atrial fibrillation: Secondary | ICD-10-CM | POA: Diagnosis not present

## 2016-01-25 DIAGNOSIS — Z7901 Long term (current) use of anticoagulants: Secondary | ICD-10-CM | POA: Diagnosis not present

## 2016-01-25 DIAGNOSIS — K921 Melena: Secondary | ICD-10-CM | POA: Diagnosis not present

## 2016-01-25 HISTORY — PX: COLONOSCOPY: SHX5424

## 2016-01-25 LAB — COMPREHENSIVE METABOLIC PANEL
ALBUMIN: 2.5 g/dL — AB (ref 3.5–5.0)
ALK PHOS: 36 U/L — AB (ref 38–126)
ALT: 9 U/L — AB (ref 17–63)
AST: 17 U/L (ref 15–41)
Anion gap: 8 (ref 5–15)
BUN: 20 mg/dL (ref 6–20)
CALCIUM: 8.4 mg/dL — AB (ref 8.9–10.3)
CO2: 28 mmol/L (ref 22–32)
CREATININE: 1.57 mg/dL — AB (ref 0.61–1.24)
Chloride: 104 mmol/L (ref 101–111)
GFR, EST AFRICAN AMERICAN: 47 mL/min — AB (ref >60)
GFR, EST NON AFRICAN AMERICAN: 41 mL/min — AB (ref >60)
Glucose, Bld: 93 mg/dL (ref 65–99)
Potassium: 3.8 mmol/L (ref 3.5–5.1)
Sodium: 140 mmol/L (ref 135–145)
Total Bilirubin: 0.4 mg/dL (ref 0.3–1.2)
Total Protein: 6.4 g/dL — ABNORMAL LOW (ref 6.5–8.1)

## 2016-01-25 LAB — CBC
HEMATOCRIT: 32.1 % — AB (ref 39.0–52.0)
Hemoglobin: 10.4 g/dL — ABNORMAL LOW (ref 13.0–17.0)
MCH: 31 pg (ref 26.0–34.0)
MCHC: 32.4 g/dL (ref 30.0–36.0)
MCV: 95.8 fL (ref 78.0–100.0)
Platelets: 192 10*3/uL (ref 150–400)
RBC: 3.35 MIL/uL — AB (ref 4.22–5.81)
RDW: 14.3 % (ref 11.5–15.5)
WBC: 5.9 10*3/uL (ref 4.0–10.5)

## 2016-01-25 LAB — GLUCOSE, CAPILLARY
GLUCOSE-CAPILLARY: 103 mg/dL — AB (ref 65–99)
GLUCOSE-CAPILLARY: 139 mg/dL — AB (ref 65–99)
GLUCOSE-CAPILLARY: 212 mg/dL — AB (ref 65–99)
Glucose-Capillary: 79 mg/dL (ref 65–99)

## 2016-01-25 SURGERY — COLONOSCOPY
Anesthesia: Moderate Sedation

## 2016-01-25 MED ORDER — FENTANYL CITRATE (PF) 100 MCG/2ML IJ SOLN
INTRAMUSCULAR | Status: DC | PRN
  Administered 2016-01-25: 25 ug via INTRAVENOUS
  Administered 2016-01-25: 12.5 ug via INTRAVENOUS

## 2016-01-25 MED ORDER — SODIUM CHLORIDE 0.9 % IV SOLN
INTRAVENOUS | Status: DC
  Administered 2016-01-25: 500 mL via INTRAVENOUS

## 2016-01-25 MED ORDER — MIDAZOLAM HCL 5 MG/ML IJ SOLN
INTRAMUSCULAR | Status: AC
  Filled 2016-01-25: qty 2

## 2016-01-25 MED ORDER — FENTANYL CITRATE (PF) 100 MCG/2ML IJ SOLN
INTRAMUSCULAR | Status: AC
  Filled 2016-01-25: qty 2

## 2016-01-25 MED ORDER — BACITRACIN-NEOMYCIN-POLYMYXIN OINTMENT TUBE
TOPICAL_OINTMENT | CUTANEOUS | Status: DC | PRN
  Filled 2016-01-25: qty 15

## 2016-01-25 MED ORDER — MIDAZOLAM HCL 5 MG/5ML IJ SOLN
INTRAMUSCULAR | Status: DC | PRN
  Administered 2016-01-25: 2 mg via INTRAVENOUS
  Administered 2016-01-25 (×3): 1 mg via INTRAVENOUS

## 2016-01-25 NOTE — Progress Notes (Signed)
Patient ID: [name], male   DOB: 1936-07-10, 80 y.o.   MRN: [id number] Medicine attending: Database reviewed. Management discussed with resident physician Dr. Albin Felling. I concur with her evaluation and management plan. No further melena. Hemoglobin stable at 10.4 g. We greatly appreciate GI assistance. Colonoscopy shows no mass lesion or ulceration to explain the patient's recurrent episodes of melena. Capsule endoscopy will be done tomorrow.

## 2016-01-25 NOTE — Progress Notes (Signed)
Subjective: No acute events overnight. He had his colonoscopy this morning which was negative for any source of his melena/anemia/heme positivity. He will have a capsule endoscopy tomorrow.  He reports feeling well. He is hungry. Denies any more dark stools.   Objective: Vital signs in last 24 hours: Filed Vitals:   01/25/16 0546 01/25/16 0744 01/25/16 0745 01/25/16 0750  BP: 138/87 139/71 139/71 115/87  Pulse: 98  48 92  Temp: 99.2 F (37.3 C) 98.7 F (37.1 C)    TempSrc: Oral Oral    Resp: '17 14 17 18  '$ Height:      Weight:      SpO2: 97% 100% 99% 99%   Weight change:   Intake/Output Summary (Last 24 hours) at 01/25/16 0806 Last data filed at 01/25/16 0540  Gross per 24 hour  Intake   1800 ml  Output    401 ml  Net   1399 ml   General: sitting up in bed, NAD HEENT: EOMI, no scleral icterus Cardiac: irregularly irregular rhythm, normal rate Pulm: CTA bilaterally, breaths non-labored Abd: soft, nontender, nondistended Ext: warm and well perfused, left leg slightly larger than right Neuro: alert and oriented X3, no focal deficits  Lab Results: Basic Metabolic Panel:  Recent Labs Lab 01/24/16 0607 01/25/16 0533  NA 137 140  K 4.0 3.8  CL 100* 104  CO2 31 28  GLUCOSE 253* 93  BUN 30* 20  CREATININE 1.97* 1.57*  CALCIUM 8.4* 8.4*   Liver Function Tests:  Recent Labs Lab 01/24/16 0607 01/25/16 0533  AST 16 17  ALT 8* 9*  ALKPHOS 42 36*  BILITOT 0.4 0.4  PROT 6.7 6.4*  ALBUMIN 2.6* 2.5*   CBC:  Recent Labs Lab 01/23/16 1107 01/24/16 0607 01/25/16 0533  WBC 5.4 5.2 5.9  NEUTROABS 2.7  --   --   HGB 11.2* 11.0* 10.4*  HCT 34.3* 33.4* 32.1*  MCV 95.0 96.0 95.8  PLT 209 214 192   CBG:  Recent Labs Lab 01/24/16 1253 01/24/16 1722 01/24/16 2156  GLUCAP 238* 236* 131*   Coagulation:  Recent Labs Lab 01/23/16 1107  LABPROT 16.0*  INR 1.27   Studies/Results: Ct Head Wo Contrast  01/23/2016  CLINICAL DATA:  Lightheaded.  Fell last  week. EXAM: CT HEAD WITHOUT CONTRAST TECHNIQUE: Contiguous axial images were obtained from the base of the skull through the vertex without intravenous contrast. COMPARISON:  August 25, 2014 FINDINGS: Paranasal sinuses are well aerated. A few inferior mastoid air cells are opacified bilaterally with no overlying bony erosion or soft tissue swelling. There is a depressed fracture of the medial right orbit which is stable and chronic. No acute bony abnormalities are identified. Extracranial soft tissues are within normal limits. No subdural, epidural, or subarachnoid hemorrhage. No mass, mass effect, or midline shift. Ventricles and sulci are prominent stable. Scattered white matter changes are seen, particularly in the subcortical region on the right on image 20, not appreciated on the previous study. No acute cortical ischemia or infarct is identified. IMPRESSION: 1. No acute cortical ischemia or infarct. No bleed. Subcortical white matter changes on the right, such as on image 20, are age indeterminate but more conspicuous since August 2015. Electronically Signed   By: Dorise Bullion III M.D   On: 01/23/2016 13:32   Medications:  Scheduled Meds: . [MAR Hold] atorvastatin  40 mg Oral Daily  . [MAR Hold] cholecalciferol  1,000 Units Oral Daily  . [MAR Hold] divalproex  500 mg Oral  BID  . [MAR Hold] feeding supplement (GLUCERNA SHAKE)  237 mL Oral TID BM  . [MAR Hold] gabapentin  300 mg Oral BID  . Influenza vac split quadrivalent PF  0.5 mL Intramuscular Tomorrow-1000  . [MAR Hold] insulin aspart  0-5 Units Subcutaneous QHS  . [MAR Hold] insulin aspart  0-9 Units Subcutaneous TID WC  . [MAR Hold] insulin glargine  35 Units Subcutaneous Daily  . [MAR Hold] latanoprost  1 drop Both Eyes QHS  . [MAR Hold] nystatin  5 mL Oral QID  . [MAR Hold] pantoprazole  40 mg Oral BID  . pneumococcal 23 valent vaccine  0.5 mL Intramuscular Tomorrow-1000  . [MAR Hold] sodium chloride flush  3 mL Intravenous Q12H    Continuous Infusions: . sodium chloride 500 mL (01/25/16 0749)   PRN Meds:.[MAR Hold] albuterol Assessment/Plan:  80 y.o. man with past medical history of CVA, atrial fibrillation (CHADSVASc 8) on Eliquis, HFrEF (EF 40-45% in 2015), coronary artery disease, CKD stage 3, hypertension, peripheral vascular disease, and history of lung cancer (status post radiation) who presents due to recurrent melenotic stools.  Recurrent Melena: Colonoscopy negative for any source of bleeding. He will have a capsule endoscopy tomorrow. Likely will send him home tomorrow after procedure and contact him on whether he should restart Eliquis as patient is anxious to leave hospital. Hgb stable at 10.4.  - Capsule endoscopy tomorrow - GI following, appreciate recommendations - Continue to hold Eliquis. If capsule endoscopy for any source of bleed would restart Eliquis at lower dose (2.5 mg BID) given his age  - Continue PPI BID - Continue Nystatin - Clear liquid diet for now, NPO at midnight   Hypertension: BPs elevated this morning but could have been related to procedure. Will continue to hold home meds for now given his BPs are stable in the 916-384Y systolic currently.  - Continue to hold home hypertension medications - Can slowly add back home meds if becomes hypertensive   Atrial Fibrillation: home meds are Eliquis '5mg'$  daily and metoprolol '50mg'$  BID  - Holding home meds due to melena and lower BP  History of CHF: On Lasix 80 mg daily, lisinopril 10 mg daily and metoprolol 50 mg twice daily at home. No evidence of volume overload on exam. Last EF 40-45% in August 2015 - Hold home meds with lower BP  Coronary Artery Disease: Stable, on atorvastatin '40mg'$  daily - Continue home meds  Diabetes Mellitus: On Lantus 40 units each morning and Humulin 5-7 units twice daily before meals according to sliding scale at home. Most recent A1c of 10.4% in Dec 2016.  - Continue sensitive ISS with HS coverage -  Continue Lantus 35 units QAM  FEN Fluids: none  Electrolytes: stable, replete as needed Nutrition: Clears for now, NPO at midnight, and then carb mod after capsule study tomorrow  DVT PPx: SCDs Code: Full Dispo: Discharge possibly tomorrow   The patient does have a current PCP Jilda Panda, MD) and does not need an [facility] hospital follow-up appointment after discharge.  The patient does not have transportation limitations that hinder transportation to clinic appointments.  .Services Needed at time of discharge: Y = Yes, Blank = No PT:   OT:   RN:   Equipment:   Other:       Juliet Rude, MD 01/25/2016, 8:06 AM

## 2016-01-25 NOTE — Progress Notes (Signed)
Colonoscopy was negative for source of heme positivity, dark stools, or anemia.  There was a very good prep, but the minimal residual fecal material was not dark in color.  With the patient's approval, I have ordered a capsule endoscopy study for tomorrow. It should be completed about 9 PM. The results will not be ready, however, until it is read by our hospital physician, hopefully Monday morning.  Although from the technical perspective, it is not necessary to keep the patient in the hospital until the procedure is read, you may want to do so, because the findings of that study may impact the decision about whether to restart the patient's anticoagulation.  I will not plan to round on the patient tomorrow, but please call me if there are are any questions or if I can be of further assistance in his care. We will take care of contacting the patient with the results of his capsule endoscopy study, either in person if he is still in the hospital, or by telephone if he is discharged before the reading is complete.  Cleotis Nipper, M.D. Pager [phone] If no answer or after 5 PM call [phone]

## 2016-01-25 NOTE — Op Note (Signed)
[facility] [place], [address]   COLONOSCOPY PROCEDURE REPORT  PATIENT: [name], [name]  MR#: [id number] BIRTHDATE: 05-Mar-1936 , 79  yrs. old GENDER: male ENDOSCOPIST: Ronald Lobo, MD REFERRED BY:   Jilda Panda, MD PROCEDURE DATE:  2016-01-31 PROCEDURE:   colonoscopy ASA CLASS:   III INDICATIONS: recurrent dark stools, anemia, documented heme positivity, in a patient with chronic anticoagulation with Eloquis and 2 recent upper endoscopies that showed just minimal findings. He also reports that he had colonoscopy several years ago at the [facility] in [facility], possibly with some small polyps removed, and recommendation for a 3-5 year follow-up exam. MEDICATIONS: fentanyl 50 g, Versed 5 mg IV  DESCRIPTION OF PROCEDURE:   The patient was brought from his hospital room to the [facility] cone endoscopy unit. After the risks and benefits and of the procedure were explained, informed consent was obtained.  Digital exam showed no obvious perianal pathology, no distal rectal masses, enlarged smooth prostate.         The Pentax Adult Colon [id number]  endoscope was introduced through the anus and advanced to the cecum      .  The quality of the prep was excellent      .  The instrument was then slowly withdrawn as the colon was fully examined. Estimated blood loss is zero unless otherwise noted in this procedure report.  The exam was not difficult, but did require some abdominal pressure to control looping.    This was essentially a normal exam.  A solitary sigmoid diverticulum was encountered during insertion of the scope. There was no diffuse diverticulosis.  No other abnormalities were seen, such as polyps, masses, colitis, or vascular ectasia.  The cecum was identified by visualization of the appendiceal orifice and the ileocecal valve, but the terminal ileum was not entered.  Retroflexion was normal, as well as  reinspection of the rectum.  No biopsies were obtained.          The scope was then withdrawn from the patient and the procedure completed.  WITHDRAWAL TIME: 10 minutes  COMPLICATIONS: There were no immediate complications.   The patient tolerated the procedure quite well.  ENDOSCOPIC IMPRESSION: 1. No source of heme positivity, anemia, or intermittent dark stools seen on this exam 2. Solitary sigmoid diverticulum, otherwise, no abnormalities noted.   RECOMMENDATIONS: capsule endoscopy tomorrow to complete the patient's GI workup.   REPEAT EXAM: not necessary, in view of the patient's age and medical comorbidities  cc:  _______________________________ eSignedRonald Lobo, MD 01/31/16 8:50 AM   CPT CODES: ICD CODES:  The ICD and CPT codes recommended by this software are interpretations from the data that the clinical staff has captured with the software.  The verification of the translation of this report to the ICD and CPT codes and modifiers is the sole responsibility of the health care institution and practicing physician where this report was generated.  [employer] will not be held responsible for the validity of the ICD and CPT codes included on this report.  AMA assumes no liability for data contained or not contained herein. CPT is a Designer, television/film set of the [employer].   PATIENT NAME:  [name], [name] MR#: [id number]

## 2016-01-26 ENCOUNTER — Encounter (HOSPITAL_COMMUNITY): Payer: Self-pay | Admitting: General Surgery

## 2016-01-26 ENCOUNTER — Encounter (HOSPITAL_COMMUNITY)
Admission: EM | Disposition: A | Discharge status: Home Health | Payer: Self-pay | Source: Home / Self Care | Attending: Emergency Medicine

## 2016-01-26 DIAGNOSIS — L02215 Cutaneous abscess of perineum: Secondary | ICD-10-CM | POA: Insufficient documentation

## 2016-01-26 DIAGNOSIS — K921 Melena: Secondary | ICD-10-CM | POA: Diagnosis not present

## 2016-01-26 DIAGNOSIS — L0231 Cutaneous abscess of buttock: Secondary | ICD-10-CM

## 2016-01-26 DIAGNOSIS — R0902 Hypoxemia: Secondary | ICD-10-CM

## 2016-01-26 DIAGNOSIS — B9689 Other specified bacterial agents as the cause of diseases classified elsewhere: Secondary | ICD-10-CM

## 2016-01-26 DIAGNOSIS — F17210 Nicotine dependence, cigarettes, uncomplicated: Secondary | ICD-10-CM

## 2016-01-26 HISTORY — PX: GIVENS CAPSULE STUDY: SHX5432

## 2016-01-26 LAB — CBC
HCT: 33.1 % — ABNORMAL LOW (ref 39.0–52.0)
HEMOGLOBIN: 10.5 g/dL — AB (ref 13.0–17.0)
MCH: 30.1 pg (ref 26.0–34.0)
MCHC: 31.7 g/dL (ref 30.0–36.0)
MCV: 94.8 fL (ref 78.0–100.0)
PLATELETS: 203 10*3/uL (ref 150–400)
RBC: 3.49 MIL/uL — AB (ref 4.22–5.81)
RDW: 14.1 % (ref 11.5–15.5)
WBC: 7 10*3/uL (ref 4.0–10.5)

## 2016-01-26 LAB — GLUCOSE, CAPILLARY
GLUCOSE-CAPILLARY: 39 mg/dL — AB (ref 65–99)
GLUCOSE-CAPILLARY: 65 mg/dL (ref 65–99)
GLUCOSE-CAPILLARY: 110 mg/dL — AB (ref 65–99)
Glucose-Capillary: 61 mg/dL — ABNORMAL LOW (ref 65–99)
Glucose-Capillary: 90 mg/dL (ref 65–99)
Glucose-Capillary: 134 mg/dL — ABNORMAL HIGH (ref 65–99)
Glucose-Capillary: 143 mg/dL — ABNORMAL HIGH (ref 65–99)

## 2016-01-26 SURGERY — IMAGING PROCEDURE, GI TRACT, INTRALUMINAL, VIA CAPSULE
Anesthesia: LOCAL

## 2016-01-26 MED ORDER — LIDOCAINE-EPINEPHRINE 1 %-1:100000 IJ SOLN
20.0000 mL | Freq: Once | INTRAMUSCULAR | Status: AC
  Administered 2016-01-26: 20 mL
  Filled 2016-01-26: qty 20

## 2016-01-26 MED ORDER — TRAMADOL HCL 50 MG PO TABS
50.0000 mg | ORAL_TABLET | Freq: Four times a day (QID) | ORAL | Status: DC | PRN
  Administered 2016-01-26: 50 mg via ORAL
  Filled 2016-01-26: qty 1

## 2016-01-26 MED ORDER — DICLOFENAC SODIUM 1 % TD GEL
2.0000 g | Freq: Four times a day (QID) | TRANSDERMAL | Status: DC
  Administered 2016-01-26 – 2016-01-27 (×7): 2 g via TOPICAL
  Filled 2016-01-26: qty 100

## 2016-01-26 MED ORDER — DEXTROSE 50 % IV SOLN
INTRAVENOUS | Status: AC
  Administered 2016-01-26: 50 mL
  Filled 2016-01-26: qty 50

## 2016-01-26 MED ORDER — LISINOPRIL 10 MG PO TABS
10.0000 mg | ORAL_TABLET | Freq: Every day | ORAL | Status: DC
  Administered 2016-01-27: 10 mg via ORAL
  Filled 2016-01-26: qty 1

## 2016-01-26 MED ORDER — METOPROLOL TARTRATE 50 MG PO TABS: 50.0000 mg | ORAL_TABLET | Freq: Two times a day (BID) | ORAL | Status: DC

## 2016-01-26 MED ORDER — BENZONATATE 100 MG PO CAPS
100.0000 mg | ORAL_CAPSULE | Freq: Three times a day (TID) | ORAL | Status: DC | PRN
  Administered 2016-01-26 – 2016-01-27 (×2): 100 mg via ORAL
  Filled 2016-01-26 (×2): qty 1

## 2016-01-26 MED ORDER — METOPROLOL TARTRATE 50 MG PO TABS
50.0000 mg | ORAL_TABLET | Freq: Two times a day (BID) | ORAL | Status: DC
  Administered 2016-01-26 – 2016-01-27 (×3): 50 mg via ORAL
  Filled 2016-01-26 (×3): qty 1

## 2016-01-26 MED ORDER — ENOXAPARIN SODIUM 100 MG/ML ~~LOC~~ SOLN
90.0000 mg | Freq: Once | SUBCUTANEOUS | Status: AC
  Administered 2016-01-26: 90 mg via SUBCUTANEOUS
  Filled 2016-01-26 (×2): qty 1

## 2016-01-26 MED ORDER — LIDOCAINE HCL (CARDIAC) 20 MG/ML IV SOLN
INTRAVENOUS | Status: AC
  Administered 2016-01-26: 10:00:00
  Filled 2016-01-26: qty 5

## 2016-01-26 SURGICAL SUPPLY — 1 items: TOWEL COTTON PACK 4EA (MISCELLANEOUS) ×4 IMPLANT

## 2016-01-26 NOTE — Progress Notes (Signed)
S: Called about patient having wheezing and desatting to 86%. Patient said that he felt short of breath before, but he indicates that has since resolved. He denied and calf pain, leg swelling, chest pain, wheezing, headache, visual changes, or heart palpitations. He had no current complaints.  O: Filed Vitals:   01/26/16 1302 01/26/16 1455  BP: 205/99 139/62  Pulse: 120 101  Temp: 100.5 F (38.1 C) 99.1 F (37.3 C)  Resp: 24 19   On observation respirations were 16-20, pulse was 80-100  General: lying comfortably in bed, NAD Cardiac: Atrial fibrillation with regular rate. No murmurs. Pulmonary: Clear to auscultation. No wheezes. Unlabored breathing. Extremities: No calf swelling or tenderness. No cyanosis  A/P  The episode of hypoxia seems to have resolved, but the patient is still requiring 2LNC. There is a concern for pulmonary embolism given inactivity and being off Elliquis.  - Will give a single therapeutic dose of lovenox, renally adjusted - Will monitor for bleeding

## 2016-01-26 NOTE — Progress Notes (Addendum)
Subjective: CBG low this morning at 39, repeat 61. He was given half a tube of D50. He denies any more melanotic stools and abdominal pain. He is complaining of a "boil" on his right buttock that has been draining.   Objective: Vital signs in last 24 hours: Filed Vitals:   01/25/16 0840 01/25/16 1318 01/25/16 2142 01/26/16 0602  BP: 136/69 135/68 167/85 144/69  Pulse: 97 97 68 54  Temp:  98.7 F (37.1 C) 98.2 F (36.8 C) 100 F (37.8 C)  TempSrc:  Oral Oral Oral  Resp: '23 19 18 18  '$ Height:      Weight:      SpO2: 97% 98% 95% 93%   Weight change:   Intake/Output Summary (Last 24 hours) at 01/26/16 0910 Last data filed at 01/26/16 0654  Gross per 24 hour  Intake   1000 ml  Output   1300 ml  Net   -300 ml   General: laying in bed, NAD HEENT: EOMI, no scleral icterus Cardiac: irregularly irregular rhythm, normal rate Pulm: CTA bilaterally, breaths non-labored Abd: soft, nontender, nondistended Ext: warm and well perfused, left leg slightly larger than right Neuro: alert and oriented X3, no focal deficits Skin: There is a small (3-4cm) fluctuant area on his right buttock that is consistent with an abscess. Small amount of pus draining.   Lab Results: Basic Metabolic Panel:  Recent Labs Lab 01/24/16 0607 01/25/16 0533  NA 137 140  K 4.0 3.8  CL 100* 104  CO2 31 28  GLUCOSE 253* 93  BUN 30* 20  CREATININE 1.97* 1.57*  CALCIUM 8.4* 8.4*   Liver Function Tests:  Recent Labs Lab 01/24/16 0607 01/25/16 0533  AST 16 17  ALT 8* 9*  ALKPHOS 42 36*  BILITOT 0.4 0.4  PROT 6.7 6.4*  ALBUMIN 2.6* 2.5*   CBC:  Recent Labs Lab 01/23/16 1107 01/24/16 0607 01/25/16 0533  WBC 5.4 5.2 5.9  NEUTROABS 2.7  --   --   HGB 11.2* 11.0* 10.4*  HCT 34.3* 33.4* 32.1*  MCV 95.0 96.0 95.8  PLT 209 214 192   CBG:  Recent Labs Lab 01/25/16 1148 01/25/16 1642 01/25/16 2228 01/26/16 0758 01/26/16 0813 01/26/16 0859  GLUCAP 139* 212* 79 39* 61* 90    Coagulation:  Recent Labs Lab 01/23/16 1107  LABPROT 16.0*  INR 1.27   Medications:  Scheduled Meds: . atorvastatin  40 mg Oral Daily  . cholecalciferol  1,000 Units Oral Daily  . diclofenac sodium  2 g Topical QID  . divalproex  500 mg Oral BID  . feeding supplement (GLUCERNA SHAKE)  237 mL Oral TID BM  . gabapentin  300 mg Oral BID  . Influenza vac split quadrivalent PF  0.5 mL Intramuscular Tomorrow-1000  . insulin aspart  0-5 Units Subcutaneous QHS  . insulin aspart  0-9 Units Subcutaneous TID WC  . insulin glargine  35 Units Subcutaneous Daily  . latanoprost  1 drop Both Eyes QHS  . nystatin  5 mL Oral QID  . pantoprazole  40 mg Oral BID  . pneumococcal 23 valent vaccine  0.5 mL Intramuscular Tomorrow-1000  . sodium chloride flush  3 mL Intravenous Q12H   Continuous Infusions:   PRN Meds:.albuterol, benzonatate, neomycin-bacitracin-polymyxin Assessment/Plan:  80 y.o. man with past medical history of CVA, atrial fibrillation (CHADSVASc 8) on Eliquis, HFrEF (EF 40-45% in 2015), coronary artery disease, CKD stage 3, hypertension, peripheral vascular disease, and history of lung cancer (status post radiation) who  presents due to recurrent melenotic stools.  Recurrent Melena: Colonoscopy negative for any source of bleeding. He will have a capsule endoscopy today. Will send him home later today and let him know if he should restart his Eliquis once capsule endoscopy results are in.  - Capsule endoscopy today - GI following, appreciate recommendations - Continue to hold Eliquis. If capsule endoscopy is negative for any source of bleed would restart Eliquis at lower dose (2.5 mg BID) given his age  - Continue PPI BID - Continue Nystatin - Can have regular diet after capsule endoscopy started   Right Buttock Abscess: Has been draining a small amount of pus, but still has a lot of fluctuance. Needs I&D to get remaining pus out. Will consult General Surgery. Likely can be done  at bedside. - General Surgery consulted, appreciate help - Will need follow up with PCP to make sure wound healing well  Hypertension: BPs in 416L-845X systolic.  - Will restart home Lisinopril given Cr back at baseline and BPs mildly elevated   Atrial Fibrillation: home meds are Eliquis '5mg'$  daily and metoprolol '50mg'$  BID  - Continue to hold Eliquis - Hold Metoprolol   History of CHF: On Lasix 80 mg daily, lisinopril 10 mg daily and metoprolol 50 mg twice daily at home. No evidence of volume overload on exam. Last EF 40-45% in August 2015 - Restart Lisinopril  - No evidence of volume overload so will hold Lasix for now  Coronary Artery Disease: Stable, on atorvastatin '40mg'$  daily - Continue home meds  Diabetes Mellitus: On Lantus 40 units each morning and Humulin 5-7 units twice daily before meals according to sliding scale at home. Most recent A1c of 10.4% in Dec 2016. He had low blood sugar this morning but likely related to being NPO.  - Continue sensitive ISS with HS coverage - Continue Lantus 35 units QAM  FEN Fluids: none  Electrolytes: stable, replete as needed Nutrition: Carb mod after capsule study started   DVT PPx: SCDs Code: Full Dispo: Discharge today after capsule study and I&D  The patient does have a current PCP Jilda Panda, MD) and does not need an [facility] hospital follow-up appointment after discharge.  The patient does not have transportation limitations that hinder transportation to clinic appointments.  .Services Needed at time of discharge: Y = Yes, Blank = No PT:   OT:   RN:   Equipment:   Other:       Juliet Rude, MD 01/26/2016, 9:10 AM

## 2016-01-26 NOTE — Consult Note (Signed)
Reason for Consult: buttock abscess Referring Physician: Dr. Annye Rusk   HPI: [name] is a 80 year old male with a history of afib, CVA, previously on Eliquis, CAD, CKD, HTN, admitted with a GI bleed.  We have been asked to see him for a perirectal abscess.  The patient first noticed about 4-5 days ago.  Mostly pain and swelling.  Noted to have purulent drainage when he was prepping for the colonoscopy.  The patient has been afebrile.  Normal white count.  Continues to drain with increased pain yesterday. He is currently off anticoagulation.   Past Medical History  Diagnosis Date  . Hyperlipidemia   . HTN (hypertension)     echo 2/11: EF 50-55%, diast dyfxn, severe LVH, inf HK, LAE  . CAD (coronary artery disease)     a.  NSTEMI treated with PCI Feb 2011 with a DES.([facility]);   b. cath 2/11: D1 stents x 2 ok, AV groove CFX occluded with dist AV CFX filled by L-L collats, RCA occluded, OM2 90-95% (treated with PCI)  . MI (myocardial infarction) ([place])   . PVD (peripheral vascular disease) ([place])   . DM (diabetes mellitus) ([place])   . GERD (gastroesophageal reflux disease)   . Bipolar disorder ([place])   . Prostate cancer [facility])     status post radiation therapy in 2003  . Pulmonary edema   . Atrial fibrillation ([place])   . Respiratory difficulty 06/24/2014    intubated   . Hypertensive emergency 06/24/2014  . CKD (chronic kidney disease), stage III   . Anemia 11/28/2015  . Shortness of breath dyspnea   . Stroke [facility]) 07/2014    Past Surgical History  Procedure Laterality Date  . Femoral bypass  2003  . Esophagogastroduodenoscopy N/A 11/29/2015    Procedure: ESOPHAGOGASTRODUODENOSCOPY (EGD);  Surgeon: Wonda Horner, MD;  Location: [facility] ENDOSCOPY;  Service: Endoscopy;  Laterality: N/A;  . Esophagogastroduodenoscopy (egd) with propofol Left 12/25/2015    Procedure: ESOPHAGOGASTRODUODENOSCOPY (EGD) WITH PROPOFOL;  Surgeon: Arta Silence, MD;  Location: [facility] ENDOSCOPY;  Service:  Endoscopy;  Laterality: Left;    Family History  Problem Relation Age of Onset  . Cancer Mother   . Cancer Father   . Cancer Sister     Social History:  reports that he has been smoking Cigarettes.  He has a 25 pack-year smoking history. He has never used smokeless tobacco. He reports that he drinks alcohol. He reports that he does not use illicit drugs.  Allergies:  Allergies  Allergen Reactions  . Penicillins Other (See Comments)    Has patient had a PCN reaction causing immediate rash, facial/tongue/throat swelling, SOB or lightheadedness with hypotension: unknown Has patient had a PCN reaction causing severe rash involving mucus membranes or skin necrosis: unknown Has patient had a PCN reaction that required hospitalization unknown Has patient had a PCN reaction occurring within the last 10 years: unknown If all of the above answers are "NO", then may proceed with Cephalosporin use.    Medications:  Scheduled Meds: . atorvastatin  40 mg Oral Daily  . cholecalciferol  1,000 Units Oral Daily  . diclofenac sodium  2 g Topical QID  . divalproex  500 mg Oral BID  . feeding supplement (GLUCERNA SHAKE)  237 mL Oral TID BM  . gabapentin  300 mg Oral BID  . Influenza vac split quadrivalent PF  0.5 mL Intramuscular Tomorrow-1000  . insulin aspart  0-5 Units Subcutaneous QHS  . insulin aspart  0-9 Units Subcutaneous TID  WC  . insulin glargine  35 Units Subcutaneous Daily  . latanoprost  1 drop Both Eyes QHS  . lidocaine (cardiac) 100 mg/2m      . lidocaine-EPINEPHrine  20 mL Infiltration Once  . lisinopril  10 mg Oral Daily  . metoprolol  50 mg Oral BID  . nystatin  5 mL Oral QID  . pantoprazole  40 mg Oral BID  . pneumococcal 23 valent vaccine  0.5 mL Intramuscular Tomorrow-1000  . sodium chloride flush  3 mL Intravenous Q12H   Continuous Infusions:  PRN Meds:.albuterol, benzonatate, neomycin-bacitracin-polymyxin   Results for orders placed or performed during the hospital  encounter of 01/23/16 (from the past 48 hour(s))  Glucose, capillary     Status: Abnormal   Collection Time: 01/24/16 12:53 PM  Result Value Ref Range   Glucose-Capillary 238 (H) 65 - 99 mg/dL   Comment 1 Notify RN   Glucose, capillary     Status: Abnormal   Collection Time: 01/24/16  5:22 PM  Result Value Ref Range   Glucose-Capillary 236 (H) 65 - 99 mg/dL   Comment 1 Notify RN   Glucose, capillary     Status: Abnormal   Collection Time: 01/24/16  9:56 PM  Result Value Ref Range   Glucose-Capillary 131 (H) 65 - 99 mg/dL  CBC     Status: Abnormal   Collection Time: 01/25/16  5:33 AM  Result Value Ref Range   WBC 5.9 4.0 - 10.5 K/uL   RBC 3.35 (L) 4.22 - 5.81 MIL/uL   Hemoglobin 10.4 (L) 13.0 - 17.0 g/dL   HCT 32.1 (L) 39.0 - 52.0 %   MCV 95.8 78.0 - 100.0 fL   MCH 31.0 26.0 - 34.0 pg   MCHC 32.4 30.0 - 36.0 g/dL   RDW 14.3 11.5 - 15.5 %   Platelets 192 150 - 400 K/uL  Comprehensive metabolic panel     Status: Abnormal   Collection Time: 01/25/16  5:33 AM  Result Value Ref Range   Sodium 140 135 - 145 mmol/L   Potassium 3.8 3.5 - 5.1 mmol/L   Chloride 104 101 - 111 mmol/L   CO2 28 22 - 32 mmol/L   Glucose, Bld 93 65 - 99 mg/dL   BUN 20 6 - 20 mg/dL   Creatinine, Ser 1.57 (H) 0.61 - 1.24 mg/dL   Calcium 8.4 (L) 8.9 - 10.3 mg/dL   Total Protein 6.4 (L) 6.5 - 8.1 g/dL   Albumin 2.5 (L) 3.5 - 5.0 g/dL   AST 17 15 - 41 U/L   ALT 9 (L) 17 - 63 U/L   Alkaline Phosphatase 36 (L) 38 - 126 U/L   Total Bilirubin 0.4 0.3 - 1.2 mg/dL   GFR calc non Af Amer 41 (L) >60 mL/min   GFR calc Af Amer 47 (L) >60 mL/min    Comment: (NOTE) The eGFR has been calculated using the CKD EPI equation. This calculation has not been validated in all clinical situations. eGFR's persistently <60 mL/min signify possible Chronic Kidney Disease.    Anion gap 8 5 - 15  Glucose, capillary     Status: Abnormal   Collection Time: 01/25/16  7:55 AM  Result Value Ref Range   Glucose-Capillary 103 (H) 65  - 99 mg/dL  Glucose, capillary     Status: Abnormal   Collection Time: 01/25/16 11:48 AM  Result Value Ref Range   Glucose-Capillary 139 (H) 65 - 99 mg/dL  Glucose, capillary  Status: Abnormal   Collection Time: 01/25/16  4:42 PM  Result Value Ref Range   Glucose-Capillary 212 (H) 65 - 99 mg/dL  Glucose, capillary     Status: None   Collection Time: 01/25/16 10:28 PM  Result Value Ref Range   Glucose-Capillary 79 65 - 99 mg/dL  Glucose, capillary     Status: Abnormal   Collection Time: 01/26/16  7:58 AM  Result Value Ref Range   Glucose-Capillary 39 (LL) 65 - 99 mg/dL   Comment 1 Notify RN   Glucose, capillary     Status: Abnormal   Collection Time: 01/26/16  8:13 AM  Result Value Ref Range   Glucose-Capillary 61 (L) 65 - 99 mg/dL  Glucose, capillary     Status: None   Collection Time: 01/26/16  8:59 AM  Result Value Ref Range   Glucose-Capillary 90 65 - 99 mg/dL    No results found.  Review of Systems  Constitutional: Negative for fever and chills.  Respiratory: Negative for cough, hemoptysis, shortness of breath and wheezing.   Cardiovascular: Negative for chest pain, palpitations and leg swelling.  Gastrointestinal: Negative for nausea, vomiting and abdominal pain.   Blood pressure 144/69, pulse 54, temperature 100 F (37.8 C), temperature source Oral, resp. rate 18, height 6' (1.829 m), weight 88.451 kg (195 lb), SpO2 93 %. Physical Exam  Constitutional: He appears well-developed and well-nourished. No distress.  Cardiovascular: Normal rate, regular rhythm, normal heart sounds and intact distal pulses.  Exam reveals no gallop and no friction rub.   No murmur heard. Respiratory: No respiratory distress. He has no wheezes. He has no rales. He exhibits no tenderness.  GI: Soft. Bowel sounds are normal. He exhibits no distension and no mass. There is no tenderness. There is no rebound and no guarding.  Skin: Skin is warm and dry. He is not diaphoretic.  3x3cm area of  induration and fluctuance with purulent drainage, does not appear adequately drained.     Assessment/Plan: Abscess of right buttock-plan for bedside I&D today.  Procedure risks discussed with the patient including infection, bleeding, need for further debridement, poor wound healing and pain. He verbalizes understanding and wishes to proceed.  He will need HH RN for bid wet to dry dressing changes.  Lives alone but has a aide on daily basis.  Erby Pian ANP-BC Pager [phone] 01/26/2016, 9:36 AM

## 2016-01-26 NOTE — Progress Notes (Signed)
Patient ID: [name], male   DOB: 1936/07/30, 80 y.o.   MRN: [id number] Medicine attending: Blood pressure 139/62, pulse 101, temperature 99.1 F (37.3 C), temperature source Oral, resp. rate 19, height 6' (1.829 m), weight 195 lb (88.451 kg), SpO2 91 %. Patient examined together with resident physician Dr. Liberty Handy and Julious Oka. He is resting comfortably. Respiration 16-22. Lungs are clear. Heart rate irregularly irregular approximately 88. Extremities no edema no calf tenderness. Reviewing his history, he has a very high chads vasc2 score. He is a ongoing cigarette smoker but no prior documented COPD. He developed a acute episode of dyspnea, wheezing, elevated blood pressure, fall in his oxygen saturation. Oxygen saturations have been in the high 90s on room air since admission. Impression: He appears clinically stable at present. Petra Kuba of his transient distress unclear. Need to consider pulmonary embolus in the differential although currently not tachycardic or tachypneic. We are going to keep him in the hospital overnight. Begin renal dose adjusted low molecular weight heparin. Recent creatinine 1.6. Ventilation/perfusion lung scan if new symptoms of persistent hypoxia.

## 2016-01-26 NOTE — Progress Notes (Signed)
Pt called out saying he couldn't breathe.  Heard wheezing in the left lung upon auscultation.  Patient denied any chest pain.  Oxygen saturations 86% on room air which increased to 96% on 4L [place].  BP 205/99, temperature 100.5, respirations 24.  Informed Dr. Marijean Bravo, who will be by to assess patient.  Will page respiratory and continue to monitor.

## 2016-01-26 NOTE — Discharge Summary (Signed)
Name: [name] MRN: [id number] DOB: [birth year] 80 y.o. PCP: Jilda Panda, MD  Date of Admission: 01/23/2016 10:45 AM Date of Discharge: 01/27/2016 Attending Physician: No att. providers found  Discharge Diagnosis:  Principal Problem:   GI bleed Active Problems:   DM (diabetes mellitus), type 2 with renal complications ([place])   Hyperlipidemia associated with type 2 diabetes mellitus ([place])   Essential hypertension   Coronary atherosclerosis   PVD   Atrial fibrillation (HCC)   Tobacco abuse   Diastolic CHF, chronic (HCC)   CKD (chronic kidney disease), stage III   Stroke ([place])   Melena   Perineal abscess, superficial  Discharge Medications:   Medication List    STOP taking these medications        furosemide 40 MG tablet  Commonly known as:  LASIX      TAKE these medications        acetaminophen 325 MG tablet  Commonly known as:  TYLENOL  Take 650 mg by mouth 2 (two) times daily.     albuterol 108 (90 Base) MCG/ACT inhaler  Commonly known as:  PROVENTIL HFA;VENTOLIN HFA  Inhale 2 puffs into the lungs every 6 (six) hours as needed for shortness of breath.     ammonium lactate 12 % lotion  Commonly known as:  LAC-HYDRIN  Apply 1 application topically daily.     apixaban 2.5 MG Tabs tablet  Commonly known as:  ELIQUIS  Take 1 tablet (2.5 mg total) by mouth 2 (two) times daily.     atorvastatin 40 MG tablet  Commonly known as:  LIPITOR  Take 1 tablet (40 mg total) by mouth daily.     cholecalciferol 1000 units tablet  Commonly known as:  VITAMIN D  Take 1,000 Units by mouth daily.     cilostazol 50 MG tablet  Commonly known as:  PLETAL  Take 50 mg by mouth every 12 (twelve) hours.     dextrose 40 % Gel  Commonly known as:  GLUTOSE  Take 1 Tube by mouth daily as needed for low blood sugar.     diclofenac sodium 1 % Gel  Commonly known as:  VOLTAREN  Apply 2 g topically 4 (four) times daily.     divalproex 500 MG 24 hr tablet  Commonly known  as:  DEPAKOTE ER  Take 500 mg by mouth 2 (two) times daily.     feeding supplement (GLUCERNA SHAKE) Liqd  Take 237 mLs by mouth 3 (three) times daily between meals.     finasteride 5 MG tablet  Commonly known as:  PROSCAR  Take 5 mg by mouth daily.     gabapentin 300 MG capsule  Commonly known as:  NEURONTIN  Take 300 mg by mouth 2 (two) times daily.     insulin glargine 100 UNIT/ML injection  Commonly known as:  LANTUS  Inject 40 Units into the skin daily.     insulin regular 100 units/mL injection  Commonly known as:  NOVOLIN R,HUMULIN R  Inject 5-7 Units into the skin 2 (two) times daily before a meal. Per sliding scale     latanoprost 0.005 % ophthalmic solution  Commonly known as:  XALATAN  Place 1 drop into both eyes at bedtime.     lisinopril 20 MG tablet  Commonly known as:  PRINIVIL,ZESTRIL  Take 10 mg by mouth daily.     metoprolol 50 MG tablet  Commonly known as:  LOPRESSOR  Take 50 mg by mouth 2 (two)  times daily.     multivitamin with minerals tablet  Take 1 tablet by mouth daily.     nystatin 100000 UNIT/ML suspension  Commonly known as:  MYCOSTATIN  Take 5 mLs (500,000 Units total) by mouth 4 (four) times daily.     pantoprazole 40 MG tablet  Commonly known as:  PROTONIX  Take 1 tablet (40 mg total) by mouth daily.     simethicone 80 MG chewable tablet  Commonly known as:  MYLICON  Chew 80 mg by mouth 3 (three) times daily as needed for flatulence. Take with meals as needed     sulfamethoxazole-trimethoprim 800-160 MG tablet  Commonly known as:  BACTRIM DS,SEPTRA DS  Take 1 tablet by mouth every 12 (twelve) hours.     tadalafil 10 MG tablet  Commonly known as:  CIALIS  Take 10 mg by mouth daily as needed for erectile dysfunction.     tamsulosin 0.4 MG Caps capsule  Commonly known as:  FLOMAX  Take 0.4 mg by mouth daily.     vitamin B-12 1000 MCG tablet  Commonly known as:  CYANOCOBALAMIN  Take 1,000 mcg by mouth daily.         Disposition and follow-up:   [name] was discharged from [facility] in Stable condition.   1. At the hospital follow up visit please address:  - blood pressure control  - ability to receive Eliquis at new dose - any further bleeding - ability to receive home health nursing for wound care and completion of 5 days Bactrim - follow up with GI - needs better glycemic control based on last A1C  2.  Labs / imaging needed at time of follow-up: none  3.  Pending labs/ test needing follow-up: none  Follow-up Appointments: Follow-up Information    Follow up with Jilda Panda, MD.   Specialty:  Internal Medicine   Why:  Please follow up with your primary care doctor in the next 1-2 weeks   Contact information:   [address] [place] [address] [phone]       Follow up with [facility].   Specialty:  General Surgery   Why:  As needed, in the dow clinic for wound check   Contact information:   [address] [place] [address] [phone]       Follow up with [facility].   Why:  RN for dressing changes.   Contact information:   [address] [place] [address] [phone]       Follow up with [facility].   Why:  Home Health Aide will resume services tomorrow.      Discharge Instructions: Discharge Instructions    Diet - low sodium heart healthy    Complete by:  As directed      Increase activity slowly    Complete by:  As directed            Consultations: Treatment Team:  Ronald Lobo, MD  Procedures Performed:  Ct Head Wo Contrast  01/23/2016  CLINICAL DATA:  Lightheaded.  Fell last week. EXAM: CT HEAD WITHOUT CONTRAST TECHNIQUE: Contiguous axial images were obtained from the base of the skull through the vertex without intravenous contrast. COMPARISON:  August 25, 2014 FINDINGS: Paranasal sinuses are well aerated. A few inferior mastoid air cells are  opacified bilaterally with no overlying bony erosion or soft tissue swelling. There is a depressed fracture of the medial right orbit  which is stable and chronic. No acute bony abnormalities are identified. Extracranial soft tissues are within normal limits. No subdural, epidural, or subarachnoid hemorrhage. No mass, mass effect, or midline shift. Ventricles and sulci are prominent stable. Scattered white matter changes are seen, particularly in the subcortical region on the right on image 20, not appreciated on the previous study. No acute cortical ischemia or infarct is identified. IMPRESSION: 1. No acute cortical ischemia or infarct. No bleed. Subcortical white matter changes on the right, such as on image 20, are age indeterminate but more conspicuous since August 2015. Electronically Signed   By: Dorise Bullion III M.D   On: 01/23/2016 13:32    2D Echo: none  Cardiac Cath: none  Admission HPI:  [name] is a 80 y.o. man with past medical history of CVA, atrial fibrillation (CHADSVASc 8) on Eliquis, HFrEF (EF 40-45% in 2015), coronary artery disease, CKD stage 3, hypertension, peripheral vascular disease, and history of lung cancer (status post radiation) who presents due to recurrent melenotic stools.  Patient was previously admitted from Dec 1-2, 2016 for melenotic stool and had EGD with Dr. Penelope Coop which showed candidal esophagitis and few erosions in the pre-pyloric antrum. It did not show signs of active bleeding. At that time, it was determined the Eliquis was possibly causing oozing and led to melena. Patient was recommended to start PPI and continue Eliquis. He was also given course of Nystatin for candida esophagitis. During that admission, his hemoglobin remained stable above 9. After this discharge, he reportedly was having normal bowel movements and stool at home, but then returned to the hospital on Dec 23, 2015 because of a recurrence of his melena and fatigue. At this  time, he was noted to be hypotensive to the 82X systolic. Repeat EGD during this admission due to concern for worsening gastric erosions did not show any ulcer or erosion. There was also no evidence of active or past bleeding. The only other notable items were 2 small, solitary AVMs that were not actively bleeding. At discharge, his Eliquis was held until PCP follow up and PPI continued. It was recommended he pursue colonoscopy but patient was unsure if he wanted to have this done so it was not scheduled. His most recent colonoscopy was 3-4 years ago, which reportedly showed some polyps per patient, and he was told to follow up in 3-5 years.   Today, patient returned to the Emergency Department with a 4 day history of dark stools and lightheadedness when standing. States his stools are dark with every bowel movement and had 3 episodes of loose stool yesterday. He denies any bright red blood, no hematuria, any other signs of bleeding. He reports that he followed up with his PCP and restarted Eliquis about 2 weeks ago. States he finished his Nystatin course a few days ago but thinks this really helped with his dark stools. He has not yet followed up with GI for colonoscopy. Since discharge at end of December and prior to to restarting Eliquis, patient reports normal bowel movements.   On review of systems, patient denies chest pain, shortness of breath, abdominal pain, constipation, fever, chills, dysuria. He reports melenotic stool, fatigue and weakness, and lightheadedness with standing. No loss of consciousness. No night sweats or weight loss.  Hospital Course by problem list: Principal Problem:   GI bleed Active Problems:   DM (diabetes mellitus), type 2 with renal complications ([place])   Hyperlipidemia associated with type 2 diabetes mellitus ([place])  Essential hypertension   Coronary atherosclerosis   PVD   Atrial fibrillation (H[facility])   Tobacco abuse   Diastolic CHF, chronic (HCC)    CKD (chronic kidney disease), stage III   Stroke ([place])   Melena   Perineal abscess, superficial   Recurrent Melena: Colonoscopy this admission was negative for bleeding source. Capsule endoscopy results unable to be interpreted due to malfunctioning of video.  Patient has follow up with [facility] and they will be able to determine next step in appropriate management.  Due to patient CHADSVASC score of 8 and the need for anticoagulation, his Eliquis was restarted at discharge.  Given his age and kidney function, we continued this at an adjusted dose of 2.'5mg'$  BID.  Patients hemoglobin this admission remained stable and actually was improved from previous admission.  We continued his PPI and Nystatin as these have helped according to patient.  Right Buttock Abscess: I&D by general surgery after patient brought abscess to our attention.  Patient had home health nursing ordered at discharge to assist with BID wet-to-dry dressing changes.  His PCP will make sure these are healing okay and patient discharged with 5-day course of Bactrim to complete therapy on 01/31/2016.  Left Foot Pain: unclear etiology with onset night before discharge. Relieved with massage and 1-time dose of Percocet. No unilateral leg swelling, no warmth or erythema to suggest DVT. Pain is located more on the plantar surface of his foot and lateral malleolar area. No calf tenderness. Recommend PCP follow up to monitor for improvement.  Hypertension: Initially held all meds at admission.  We then continued on home Lisinopril and Metoprolol with BPs being elevated after a couple days inpatient.  Lasix was held due to concern for hypotension that was present on admission and no evidence of volume overload.  Patient had been told at recent [facility] ED visit to hold Lasix and Lisinopril until PCP folllow up due to decreased BP.  Recommend patient address BP management outpatient with his PCP to obtain the best control.  Atrial Fibrillation:  Continued Metoprolol and Eliquis at decreased dose as above.  History of CHF: On Lasix 80 mg daily, lisinopril 10 mg daily and metoprolol 50 mg twice daily at home prior to admission. No evidence of volume overload on exam during his admission. Last EF 40-45% in August 2015.  We continued Lisinopril and Metoprolol.  Lasix held as no evidence of volume overload and due to concern of hypotension.  Diabetes Mellitus: On Lantus 40 units each morning and Humulin 5-7 units twice daily before meals according to sliding scale at home. Most recent A1c of 10.4% in Dec 2016. We continued Lantus and Novolog sliding scale for glycemic control.  Needs better glucose control as outpatient going forward.  Discharge Vitals:   BP 119/68 mmHg  Pulse 92  Temp(Src) 99.3 F (37.4 C) (Oral)  Resp 18  Ht 6' (1.829 m)  Wt 195 lb (88.451 kg)  BMI 26.44 kg/m2  SpO2 91%  Discharge Labs:  No results found for this or any previous visit (from the past 24 hour(s)).  Signed: Jule Ser, DO 01/28/2016, 2:36 PM    Services Ordered on Discharge: [facility] RN Equipment Ordered on Discharge: none

## 2016-01-26 NOTE — Progress Notes (Signed)
Patient ID: [name], male   DOB: [birth year], 80 y.o.   MRN: [id number] Medicine attending: Prompt surgical assistance greatly appreciated. I examined this patient this morning together with resident physician Dr. Albin Felling and I concur with her evaluation and management plan which we discussed together. The patient was nothing by mouth for capsule endoscopy. Blood sugar this morning 39. Repeat 61. He was asymptomatic. One half amp of D50 was administered. Patient directed our attention to his buttocks area where he had a recurrent abscess right intergluteal fold. Gen. surgery was consulted and a bedside incision and drainage procedure was done. At 1:03 PM nurse notified us that the patient was in respiratory distress with audible wheezing. No chest pain. Oxygen saturation 86 6% on room air increased to 96% on oxygen. Blood pressure 205/99. Respirations 24. Temperature 100.5. Further evaluation in progress. Plan to discharge patient today will now be reevaluated. He was on Eliquis anticoagulation in view of chronic atrial fibrillation which was held at time of admission secondary to acute GI bleeding.

## 2016-01-26 NOTE — Procedures (Signed)
Incision and Drainage Procedure Note  Pre-operative Diagnosis: right gluteal abscess  Post-operative Diagnosis: same  Indications: 80 year old male with a right gluteal abscess for 5 days.  Spontaneously draining, but inadequately drained.    Anesthesia: 1% lidocaine with epinephrine  Procedure Details  The procedure, risks and complications have been discussed in detail (including, but not limited to airway compromise, infection, bleeding) with the patient, and the patient has signed consent to the procedure.  The skin was sterilely prepped and draped over the affected area in the usual fashion. After adequate local anesthesia, I&D with a #11 blade was performed on the right buttock. Purulent drainage: present.  The patient was observed until stable.  Findings: same  EBL: <5 cc's  Drains: none  Condition: Tolerated procedure well and Stable   Complications: none.  Bryella Diviney, ANP-BC

## 2016-01-27 ENCOUNTER — Encounter (HOSPITAL_COMMUNITY): Payer: Self-pay | Admitting: Gastroenterology

## 2016-01-27 DIAGNOSIS — L0231 Cutaneous abscess of buttock: Secondary | ICD-10-CM | POA: Diagnosis not present

## 2016-01-27 DIAGNOSIS — K922 Gastrointestinal hemorrhage, unspecified: Secondary | ICD-10-CM

## 2016-01-27 DIAGNOSIS — K921 Melena: Secondary | ICD-10-CM | POA: Diagnosis not present

## 2016-01-27 DIAGNOSIS — B9689 Other specified bacterial agents as the cause of diseases classified elsewhere: Secondary | ICD-10-CM | POA: Diagnosis not present

## 2016-01-27 DIAGNOSIS — E11649 Type 2 diabetes mellitus with hypoglycemia without coma: Secondary | ICD-10-CM

## 2016-01-27 LAB — GLUCOSE, CAPILLARY
GLUCOSE-CAPILLARY: 184 mg/dL — AB (ref 65–99)
Glucose-Capillary: 176 mg/dL — ABNORMAL HIGH (ref 65–99)

## 2016-01-27 MED ORDER — APIXABAN 2.5 MG PO TABS
2.5000 mg | ORAL_TABLET | Freq: Two times a day (BID) | ORAL | Status: DC
  Administered 2016-01-27: 2.5 mg via ORAL
  Filled 2016-01-27: qty 1

## 2016-01-27 MED ORDER — APIXABAN 5 MG PO TABS: 2.5000 mg | ORAL_TABLET | Freq: Two times a day (BID) | ORAL | Status: DC

## 2016-01-27 MED ORDER — SULFAMETHOXAZOLE-TRIMETHOPRIM 800-160 MG PO TABS: 1.0000 | ORAL_TABLET | Freq: Two times a day (BID) | ORAL | Status: AC

## 2016-01-27 MED ORDER — APIXABAN 2.5 MG PO TABS: 2.5000 mg | ORAL_TABLET | Freq: Two times a day (BID) | ORAL | Status: DC

## 2016-01-27 MED ORDER — SULFAMETHOXAZOLE-TRIMETHOPRIM 800-160 MG PO TABS
1.0000 | ORAL_TABLET | Freq: Two times a day (BID) | ORAL | Status: DC
  Administered 2016-01-27: 1 via ORAL
  Filled 2016-01-27: qty 1

## 2016-01-27 MED ORDER — OXYCODONE-ACETAMINOPHEN 5-325 MG PO TABS
1.0000 | ORAL_TABLET | Freq: Once | ORAL | Status: AC
  Administered 2016-01-27: 1 via ORAL
  Filled 2016-01-27: qty 1

## 2016-01-27 NOTE — Care Management Note (Signed)
Case Management Note  Patient Details  Name: LARUE LIGHTNER MRN: [id number] Date of Birth: 1936/06/12  Subjective/Objective:                  Patient placed in observation for a GI bleed, had an I&D of abscess to buttocks as well. Patient form home alone with HHA through [place]. CM verified HHA will restart tomorrow. CM made referral to [facility] for RN for dressing changes.     Action/Plan:  DC to home with HH.  Expected Discharge Date:                  Expected Discharge Plan:  Home/Self Care  In-House Referral:     Discharge planning Services  CM Consult  Post Acute Care Choice:    Choice offered to:  Patient  DME Arranged:    DME Agency:     HH Arranged:  RN [place] Agency:  [place]  Status of Service:  Completed, signed off  Medicare Important Message Given:    Date Medicare IM Given:    Medicare IM give by:    Date Additional Medicare IM Given:    Additional Medicare Important Message give by:     If discussed at [place] of Stay Meetings, dates discussed:    Additional Comments:  Carles Collet, RN 01/27/2016, 12:11 PM

## 2016-01-27 NOTE — Progress Notes (Signed)
Unfortunately the capsule endoscopy malfunctioned and there was not a video that could be reviewed so please call my partner Dr. B to decide any further workup or plans as an outpatient or he can follow-up with him as an outpatient as well

## 2016-01-27 NOTE — Progress Notes (Signed)
Pt discharge education and instructions completed with pt at bedside; pt voices understanding and denies any questions. Pt IV and telemetry removed; pt discharge home with son in law to transport him home. Pt to pick up electronically sent prescriptions from preferred pharmacy on file. Pt incision dsg clean, dry and intact with no stain, drainage or active bleeding noted. Pt changed into his personal clothing; pt transported off unit via wheelchair with belongings to the side. Francis Gaines Betsy Rosello RN.

## 2016-01-27 NOTE — Progress Notes (Signed)
Patient ID: [name], male   DOB: 1936-06-07, 80 y.o.   MRN: [id number]     [facility]      [address] [place], [place] [address]    Phone: [phone] FAX: [phone]     Subjective: No soreness. Low grade temp.   Objective:  Vital signs:  Filed Vitals:   01/26/16 1455 01/26/16 2115 01/26/16 2147 01/27/16 0634  BP: 139/62 163/87  119/68  Pulse: 101 99  92  Temp: 99.1 F (37.3 C) 98.4 F (36.9 C)  99.3 F (37.4 C)  TempSrc:  Oral  Oral  Resp: '19 18  18  '$ Height:      Weight:      SpO2: 91% 92% 93% 91%    Last BM Date: 01/26/16  Intake/Output   Yesterday:  01/29 0701 - 01/30 0700 In: 6226 [P.O.:1394] Out: 850 [Urine:850] This shift:  Total I/O In: 480 [P.O.:480] Out: -    Physical Exam: General: Pt awake/alert/oriented x4 in no acute distress Skin: right buttock-no areas of fluctuance, clean, repacked.    Problem List:   Principal Problem:   GI bleed Active Problems:   DM (diabetes mellitus), type 2 with renal complications (HCC)   Hyperlipidemia associated with type 2 diabetes mellitus ([place])   Essential hypertension   Coronary atherosclerosis   PVD   Atrial fibrillation (HCC)   Tobacco abuse   Diastolic CHF, chronic (HCC)   CKD (chronic kidney disease), stage III   Stroke ([place])   Melena   Perineal abscess, superficial    Results:   Labs: Results for orders placed or performed during the hospital encounter of 01/23/16 (from the past 48 hour(s))  Glucose, capillary     Status: Abnormal   Collection Time: 01/25/16 11:48 AM  Result Value Ref Range   Glucose-Capillary 139 (H) 65 - 99 mg/dL  Glucose, capillary     Status: Abnormal   Collection Time: 01/25/16  4:42 PM  Result Value Ref Range   Glucose-Capillary 212 (H) 65 - 99 mg/dL  Glucose, capillary     Status: None   Collection Time: 01/25/16 10:28 PM  Result Value Ref Range   Glucose-Capillary 79 65 - 99 mg/dL  Glucose,  capillary     Status: Abnormal   Collection Time: 01/26/16  7:58 AM  Result Value Ref Range   Glucose-Capillary 39 (LL) 65 - 99 mg/dL   Comment 1 Notify RN   Glucose, capillary     Status: Abnormal   Collection Time: 01/26/16  8:13 AM  Result Value Ref Range   Glucose-Capillary 61 (L) 65 - 99 mg/dL  Glucose, capillary     Status: None   Collection Time: 01/26/16  8:59 AM  Result Value Ref Range   Glucose-Capillary 90 65 - 99 mg/dL  Glucose, capillary     Status: None   Collection Time: 01/26/16 11:41 AM  Result Value Ref Range   Glucose-Capillary 65 65 - 99 mg/dL  Glucose, capillary     Status: Abnormal   Collection Time: 01/26/16  1:00 PM  Result Value Ref Range   Glucose-Capillary 134 (H) 65 - 99 mg/dL  CBC     Status: Abnormal   Collection Time: 01/26/16  2:05 PM  Result Value Ref Range   WBC 7.0 4.0 - 10.5 K/uL   RBC 3.49 (L) 4.22 - 5.81 MIL/uL   Hemoglobin 10.5 (L) 13.0 - 17.0 g/dL   HCT 33.1 (L) 39.0 - 52.0 %  MCV 94.8 78.0 - 100.0 fL   MCH 30.1 26.0 - 34.0 pg   MCHC 31.7 30.0 - 36.0 g/dL   RDW 14.1 11.5 - 15.5 %   Platelets 203 150 - 400 K/uL  Glucose, capillary     Status: Abnormal   Collection Time: 01/26/16  4:51 PM  Result Value Ref Range   Glucose-Capillary 110 (H) 65 - 99 mg/dL  Glucose, capillary     Status: Abnormal   Collection Time: 01/26/16  9:58 PM  Result Value Ref Range   Glucose-Capillary 143 (H) 65 - 99 mg/dL  Glucose, capillary     Status: Abnormal   Collection Time: 01/27/16  8:06 AM  Result Value Ref Range   Glucose-Capillary 176 (H) 65 - 99 mg/dL    Imaging / Studies: No results found.  Medications / Allergies:  Scheduled Meds: . atorvastatin  40 mg Oral Daily  . cholecalciferol  1,000 Units Oral Daily  . diclofenac sodium  2 g Topical QID  . divalproex  500 mg Oral BID  . feeding supplement (GLUCERNA SHAKE)  237 mL Oral TID BM  . gabapentin  300 mg Oral BID  . Influenza vac split quadrivalent PF  0.5 mL Intramuscular Tomorrow-1000   . insulin aspart  0-5 Units Subcutaneous QHS  . insulin aspart  0-9 Units Subcutaneous TID WC  . insulin glargine  35 Units Subcutaneous Daily  . latanoprost  1 drop Both Eyes QHS  . lisinopril  10 mg Oral Daily  . metoprolol  50 mg Oral BID  . nystatin  5 mL Oral QID  . pantoprazole  40 mg Oral BID  . pneumococcal 23 valent vaccine  0.5 mL Intramuscular Tomorrow-1000  . sodium chloride flush  3 mL Intravenous Q12H   Continuous Infusions:  PRN Meds:.albuterol, benzonatate, neomycin-bacitracin-polymyxin  Antibiotics: Anti-infectives    None        Assessment/Plan POD#1 I&D right gluteal abscess -wound is clean, continue bid wet to dry dressing changes, may shower. -rx septra -please call with further assistance.   Erby Pian, [facility] Pager [phone]) For consults and floor pages call [phone]([id number]:[id number])  01/27/2016  8:14 AM

## 2016-01-27 NOTE — Progress Notes (Signed)
Subjective: Patient seen and examined this morning.  No respiratory complaints since yesterday afternoon when he developed some dyspnea and nursing noted oxygen desaturation on room air.  This morning, he is breathing comfortably on room air.  Main complaints this morning is LEFT foot pain located on plantar surface of foot.  Patient also complains of ankle pain at the lateral malleolus area.  He denies any injury to the area but states it hurts to walk or put pressure on his foot.  States having the nurse massage his foot and the 1 time dose of Percocet last night helped. Patient is anxious to leave.  Objective: Vital signs in last 24 hours: Filed Vitals:   01/26/16 1455 01/26/16 2115 01/26/16 2147 01/27/16 0634  BP: 139/62 163/87  119/68  Pulse: 101 99  92  Temp: 99.1 F (37.3 C) 98.4 F (36.9 C)  99.3 F (37.4 C)  TempSrc:  Oral  Oral  Resp: '19 18  18  '$ Height:      Weight:      SpO2: 91% 92% 93% 91%   Weight change:   Intake/Output Summary (Last 24 hours) at 01/27/16 0806 Last data filed at 01/27/16 0706  Gross per 24 hour  Intake   1874 ml  Output    850 ml  Net   1024 ml   General: laying in bed, NAD HEENT: EOMI, no scleral icterus Cardiac: irregularly irregular rhythm, normal rate Pulm: CTA bilaterally, breaths non-labored, normal work of breathing Abd: soft, nontender, nondistended Ext: warm and well perfused, left leg slightly larger than right, tender to palpation along plantar surface of left foot.  Tender at lateral malleolus.  No increased warmth or erythema.  No significant edema.   Neuro: alert and oriented X3, no focal deficits  Lab Results: Basic Metabolic Panel:  Recent Labs Lab 01/24/16 0607 01/25/16 0533  NA 137 140  K 4.0 3.8  CL 100* 104  CO2 31 28  GLUCOSE 253* 93  BUN 30* 20  CREATININE 1.97* 1.57*  CALCIUM 8.4* 8.4*   Liver Function Tests:  Recent Labs Lab 01/24/16 0607 01/25/16 0533  AST 16 17  ALT 8* 9*  ALKPHOS 42 36*    BILITOT 0.4 0.4  PROT 6.7 6.4*  ALBUMIN 2.6* 2.5*   CBC:  Recent Labs Lab 01/23/16 1107  01/25/16 0533 01/26/16 1405  WBC 5.4  < > 5.9 7.0  NEUTROABS 2.7  --   --   --   HGB 11.2*  < > 10.4* 10.5*  HCT 34.3*  < > 32.1* 33.1*  MCV 95.0  < > 95.8 94.8  PLT 209  < > 192 203  < > = values in this interval not displayed. CBG:  Recent Labs Lab 01/26/16 0813 01/26/16 0859 01/26/16 1141 01/26/16 1300 01/26/16 1651 01/26/16 2158  GLUCAP 61* 90 65 134* 110* 143*   Coagulation:  Recent Labs Lab 01/23/16 1107  LABPROT 16.0*  INR 1.27   Medications:  Scheduled Meds: . atorvastatin  40 mg Oral Daily  . cholecalciferol  1,000 Units Oral Daily  . diclofenac sodium  2 g Topical QID  . divalproex  500 mg Oral BID  . feeding supplement (GLUCERNA SHAKE)  237 mL Oral TID BM  . gabapentin  300 mg Oral BID  . Influenza vac split quadrivalent PF  0.5 mL Intramuscular Tomorrow-1000  . insulin aspart  0-5 Units Subcutaneous QHS  . insulin aspart  0-9 Units Subcutaneous TID WC  . insulin glargine  35 Units Subcutaneous Daily  . latanoprost  1 drop Both Eyes QHS  . lisinopril  10 mg Oral Daily  . metoprolol  50 mg Oral BID  . nystatin  5 mL Oral QID  . pantoprazole  40 mg Oral BID  . pneumococcal 23 valent vaccine  0.5 mL Intramuscular Tomorrow-1000  . sodium chloride flush  3 mL Intravenous Q12H   Continuous Infusions:   PRN Meds:.albuterol, benzonatate, neomycin-bacitracin-polymyxin Assessment/Plan:  80 y.o. man with past medical history of CVA, atrial fibrillation (CHADSVASc 8) on Eliquis, HFrEF (EF 40-45% in 2015), coronary artery disease, CKD stage 3, hypertension, peripheral vascular disease, and history of lung cancer (status post radiation) who presents due to recurrent melenotic stools.  Recurrent Melena:  Colonoscopy yesterday negative for bleeding source.  Capsule endoscopy results pending.  Based on those results, will let patient know if he should restart his  Eliquis. - await capsule endoscopy results - appreciate GI recommendations on patient - continue to hold Eliquis.   If capsule endoscopy is negative for any source of bleed would restart Eliquis at lower dose (2.5 mg BID) given his age  - continue PPI BID - continue Nystatin  Right Buttock Abscess: I&D yesterday by general surgery - appreciate assistance of general surgery - will need HH RN for BID wet-to-dry dressing changes, arrange at discharge - PCP follow up to make sure healing okay - TMP-SMX 800-160 1 tablet q12h x 5 days   Left Foot Pain: unclear etiology with onset yesterday.  Relieved with massage and 1-time dose of Percocet.  No unilateral leg swelling, no warmth or erythema to suggest DVT.  Pain is located more on the plantar surface of his foot and lateral malleolar area.  No calf tenderness.  - continue to monitor  Hypertension - restarted yesterday back on home Lisinopril and Metoprolol with BPs being elevated  Atrial Fibrillation: home meds are Eliquis '5mg'$  daily and metoprolol '50mg'$  BID  - continue to hold Eliquis pending capsule endoscopy results - restarted Metoprolol yesterday  History of CHF: On Lasix 80 mg daily, lisinopril 10 mg daily and metoprolol 50 mg twice daily at home. No evidence of volume overload on exam. Last EF 40-45% in August 2015 - continue Lisinopril and Metoprolol - No evidence of volume overload so will hold Lasix for now  Coronary Artery Disease: Stable, on atorvastatin '40mg'$  daily - Continue home meds  Diabetes Mellitus: On Lantus 40 units each morning and Humulin 5-7 units twice daily before meals according to sliding scale at home. Most recent A1c of 10.4% in Dec 2016.  - Continue sensitive ISS with HS coverage - Continue Lantus 35 units QAM  FEN Fluids: none  Electrolytes: stable, replete as needed Nutrition: Carb mod after capsule study started   DVT PPx: SCDs Code: Full Dispo: Discharge today after capsule study and I&D  The  patient does have a current PCP Jilda Panda, MD) and does not need an [facility] hospital follow-up appointment after discharge.  The patient does not have transportation limitations that hinder transportation to clinic appointments.  .Services Needed at time of discharge: Y = Yes, Blank = No PT:   OT:   RN:  yes  Equipment:   Other:       Jule Ser, DO 01/27/2016, 8:06 AM

## 2016-01-27 NOTE — Progress Notes (Signed)
Inpatient Diabetes Program Recommendations  AACE/ADA: New Consensus Statement on Inpatient Glycemic Control (2015)  Target Ranges:  Prepandial:   less than 140 mg/dL      Peak postprandial:   less than 180 mg/dL (1-2 hours)      Critically ill patients:  140 - 180 mg/dL  Results for MCKALE, HAFFEY (MRN [id number]) as of 01/27/2016 10:22  Ref. Range 01/26/2016 07:58 01/26/2016 08:13 01/26/2016 08:59 01/26/2016 11:41 01/26/2016 13:00 01/26/2016 16:51 01/26/2016 21:58 01/27/2016 08:06  Glucose-Capillary Latest Ref Range: 65-99 mg/dL 39 (LL) 61 (L) 90 65 134 (H) 110 (H) 143 (H) 176 (H)   Review of Glycemic Control  Current orders for Inpatient glycemic control: Lantus 35 units daily, Novolog 0-9 units TID with meals, Novolog 0-5 units HS  Inpatient Diabetes Program Recommendations: Insulin - Basal: In reviewing the chart, noted patient received Lantus 35 units on 01/25/16 at 10:44 am and fasting glucose was 39 mg/dl on 01/26/16. Patient was NOT GIVEN any Lantus on 01/26/16 and fasting glucose is 176 mg/dl this morning. Patient has already received Lantus 35 units this morning. If patient has any further episodes of hypoglycemia, please consider decreasing Lantus.   Thanks, Barnie Alderman, RN, MSN, CDE Diabetes Coordinator Inpatient Diabetes Program [phone] (Team Pager from [place] to [place]) [phone] ([place] office) [phone] [facility] office) [phone] [facility] office)

## 2016-01-27 NOTE — Discharge Instructions (Signed)
You were hospitalized for dark stools. Your colonoscopy did not show any source of bleeding. You also started a capsule endoscopy study. The results of this test won't be available until at least Monday 01/27/16.   You can restart your Eliquis but we are starting it at a lower dose than previously.  You can take 2.'5mg'$  twice daily.  Also, please do not take your blood pressure medicine called Lasix until you see your doctor. It is ok to take the Lisinopril and Metoprolol.   You need to take an antibiotic for your right buttock abscess.  You will take Bactrim twice daily for 5 days.  You got a dose this morning so can take another dose this evening.  WOUND CARE  It is important that the wound be kept open.   -Keeping the skin edges apart will allow the wound to gradually heal from the base upwards.   - If the skin edges of the wound close too early, a new fluid pocket can form and infection can occur. -This is the reason to pack deeper wounds with gauze or ribbon -This is why drained wounds cannot be sewed closed right away  A healthy wound should form a lining of bright red "beefy" granulating tissue that will help shrink the wound and help the edges grow new skin into it.   -A little mucus / yellow discharge is normal (the body's natural way to try and form a scab) and should be gently washed off with soap and water with daily dressing changes.  -Green or foul smelling drainage implies bacterial colonization and can slow wound healing - a short course of antibiotic ointment (3-5 days) can help it clear up.  Call the doctor if it does not improve or worsens  -Avoid use of antibiotic ointments for more than a week as they can slow wound healing over time.    -Sometimes other wound care products will be used to reduce need for dressing changes and/or help clean up dirty wounds -Sometimes the surgeon needs to debride the wound in the office to remove dead or infected tissue out of the wound so it can  heal more quickly and safely.    Change the dressing at least once a day -Wash the wound with mild soap and water gently every day.  It is good to shower or bathe the wound to help it clean out. -Use clean 4x4 gauze for medium/large wounds or ribbon plain NU-gauze for smaller wounds (it does not need to be sterile, just clean) -Keep the raw wound moist with a little saline or KY (saline) gel on the gauze.  -A dry wound will take longer to heal.  -Keep the skin dry around the wound to prevent breakdown and irritation. -Pack the wound down to the base -The goal is to keep the skin apart, not overpack the wound -Use a Q-tip or blunt-tipped kabob stick toothpick to push the gauze down to the base in narrow or deep wounds   -Cover with a clean gauze and tape -paper or Medipore tape tend to be gentle on the skin -rotate the orientation of the tape to avoid repeated stress/trauma on the skin -using an ACE or Coban wrap on wounds on arms or legs can be used instead.  Complete all antibiotics through the entire prescription to help the infection heal and prevent new places of infection   Returning the see the surgeon is helpful to follow the healing process and help the wound close as  fast as possible.

## 2016-01-28 ENCOUNTER — Encounter (HOSPITAL_COMMUNITY): Payer: Self-pay | Admitting: Gastroenterology

## 2016-01-28 ENCOUNTER — Telehealth: Payer: Self-pay | Admitting: Internal Medicine

## 2016-01-28 DIAGNOSIS — E160 Drug-induced hypoglycemia without coma: Secondary | ICD-10-CM | POA: Insufficient documentation

## 2016-01-28 DIAGNOSIS — T383X5A Adverse effect of insulin and oral hypoglycemic [antidiabetic] drugs, initial encounter: Secondary | ICD-10-CM

## 2016-01-28 DIAGNOSIS — R0603 Acute respiratory distress: Secondary | ICD-10-CM | POA: Insufficient documentation

## 2016-01-28 NOTE — Telephone Encounter (Signed)
I received a phone call this afternoon from Mr. Wingard stating that the only prescription available after his discharge from hospital was Bactrim.  Patient stated that he needed his Eliquis 2.'5mg'$  BID and Nystatin QID still.  I called pharmacy and they had prescription for Eliquis ready but were waiting for prior authorization from insurance company.  I had discussed with insurance company prior to discharge and they had authorized the Eliquis.  Pharmacist I spoke with this afternoon stated they now had received this.  I also asked them to refill Nystatin solution that was continued at discharge.  Patient also asking for Percocet to be sent for his left foot pain.  Explained to patient that I did not feel comfortable prescribing Percocet for this pain and that he should contact his PCP to discuss further pain management.  Recommended heat/ice therapy, Tylenol, and elevation in the interim as well as continuing his home gabapentin.  Jule Ser, DO 01/28/2016, 2:26 PM PGY-1, [facility] Pager: [phone]

## 2016-01-29 ENCOUNTER — Telehealth: Payer: Self-pay | Admitting: *Deleted

## 2016-01-29 NOTE — Telephone Encounter (Signed)
Received faxed notice from [employer] that patients Eliquis 2.'5mg'$  tabs were approved under pt's medicare part D benefit. Authorization approved through 12/27/2016.Despina Hidden Cassady2/1/20179:03 AM       REF# [id number] Pt ID# [id number]

## 2016-05-11 ENCOUNTER — Emergency Department (HOSPITAL_COMMUNITY): Payer: Medicare Other

## 2016-05-11 ENCOUNTER — Encounter (HOSPITAL_COMMUNITY): Payer: Self-pay | Admitting: Emergency Medicine

## 2016-05-11 ENCOUNTER — Inpatient Hospital Stay (HOSPITAL_COMMUNITY)
Admission: EM | Admit: 2016-05-11 | Discharge: 2016-05-14 | DRG: 377 | Discharge status: Against Medical Advice | Payer: Medicare Other | Attending: Internal Medicine | Admitting: Internal Medicine

## 2016-05-11 DIAGNOSIS — Z923 Personal history of irradiation: Secondary | ICD-10-CM

## 2016-05-11 DIAGNOSIS — R778 Other specified abnormalities of plasma proteins: Secondary | ICD-10-CM | POA: Diagnosis present

## 2016-05-11 DIAGNOSIS — Z809 Family history of malignant neoplasm, unspecified: Secondary | ICD-10-CM | POA: Diagnosis not present

## 2016-05-11 DIAGNOSIS — I252 Old myocardial infarction: Secondary | ICD-10-CM | POA: Diagnosis not present

## 2016-05-11 DIAGNOSIS — I452 Bifascicular block: Secondary | ICD-10-CM | POA: Diagnosis present

## 2016-05-11 DIAGNOSIS — F17210 Nicotine dependence, cigarettes, uncomplicated: Secondary | ICD-10-CM | POA: Diagnosis present

## 2016-05-11 DIAGNOSIS — D62 Acute posthemorrhagic anemia: Secondary | ICD-10-CM | POA: Diagnosis present

## 2016-05-11 DIAGNOSIS — K31811 Angiodysplasia of stomach and duodenum with bleeding: Secondary | ICD-10-CM | POA: Diagnosis present

## 2016-05-11 DIAGNOSIS — I248 Other forms of acute ischemic heart disease: Secondary | ICD-10-CM | POA: Diagnosis present

## 2016-05-11 DIAGNOSIS — N183 Chronic kidney disease, stage 3 unspecified: Secondary | ICD-10-CM | POA: Diagnosis present

## 2016-05-11 DIAGNOSIS — Z7901 Long term (current) use of anticoagulants: Secondary | ICD-10-CM

## 2016-05-11 DIAGNOSIS — K449 Diaphragmatic hernia without obstruction or gangrene: Secondary | ICD-10-CM | POA: Diagnosis present

## 2016-05-11 DIAGNOSIS — E1151 Type 2 diabetes mellitus with diabetic peripheral angiopathy without gangrene: Secondary | ICD-10-CM | POA: Diagnosis present

## 2016-05-11 DIAGNOSIS — N179 Acute kidney failure, unspecified: Secondary | ICD-10-CM | POA: Diagnosis present

## 2016-05-11 DIAGNOSIS — K219 Gastro-esophageal reflux disease without esophagitis: Secondary | ICD-10-CM | POA: Diagnosis present

## 2016-05-11 DIAGNOSIS — I161 Hypertensive emergency: Secondary | ICD-10-CM | POA: Diagnosis present

## 2016-05-11 DIAGNOSIS — Z8673 Personal history of transient ischemic attack (TIA), and cerebral infarction without residual deficits: Secondary | ICD-10-CM | POA: Diagnosis not present

## 2016-05-11 DIAGNOSIS — Z880 Allergy status to penicillin: Secondary | ICD-10-CM | POA: Diagnosis not present

## 2016-05-11 DIAGNOSIS — Z8546 Personal history of malignant neoplasm of prostate: Secondary | ICD-10-CM

## 2016-05-11 DIAGNOSIS — I481 Persistent atrial fibrillation: Secondary | ICD-10-CM | POA: Diagnosis present

## 2016-05-11 DIAGNOSIS — N184 Chronic kidney disease, stage 4 (severe): Secondary | ICD-10-CM | POA: Diagnosis present

## 2016-05-11 DIAGNOSIS — R7989 Other specified abnormal findings of blood chemistry: Secondary | ICD-10-CM | POA: Diagnosis not present

## 2016-05-11 DIAGNOSIS — E1122 Type 2 diabetes mellitus with diabetic chronic kidney disease: Secondary | ICD-10-CM | POA: Diagnosis present

## 2016-05-11 DIAGNOSIS — I482 Chronic atrial fibrillation, unspecified: Secondary | ICD-10-CM | POA: Diagnosis present

## 2016-05-11 DIAGNOSIS — I2510 Atherosclerotic heart disease of native coronary artery without angina pectoris: Secondary | ICD-10-CM | POA: Diagnosis present

## 2016-05-11 DIAGNOSIS — Q2733 Arteriovenous malformation of digestive system vessel: Secondary | ICD-10-CM | POA: Diagnosis not present

## 2016-05-11 DIAGNOSIS — E1169 Type 2 diabetes mellitus with other specified complication: Secondary | ICD-10-CM | POA: Diagnosis present

## 2016-05-11 DIAGNOSIS — Z794 Long term (current) use of insulin: Secondary | ICD-10-CM

## 2016-05-11 DIAGNOSIS — F319 Bipolar disorder, unspecified: Secondary | ICD-10-CM | POA: Diagnosis present

## 2016-05-11 DIAGNOSIS — E785 Hyperlipidemia, unspecified: Secondary | ICD-10-CM | POA: Diagnosis present

## 2016-05-11 DIAGNOSIS — N189 Chronic kidney disease, unspecified: Secondary | ICD-10-CM | POA: Diagnosis present

## 2016-05-11 DIAGNOSIS — E7800 Pure hypercholesterolemia, unspecified: Secondary | ICD-10-CM | POA: Diagnosis present

## 2016-05-11 DIAGNOSIS — Z955 Presence of coronary angioplasty implant and graft: Secondary | ICD-10-CM | POA: Diagnosis not present

## 2016-05-11 DIAGNOSIS — I5033 Acute on chronic diastolic (congestive) heart failure: Secondary | ICD-10-CM | POA: Diagnosis present

## 2016-05-11 DIAGNOSIS — K922 Gastrointestinal hemorrhage, unspecified: Secondary | ICD-10-CM | POA: Diagnosis present

## 2016-05-11 DIAGNOSIS — K921 Melena: Secondary | ICD-10-CM | POA: Diagnosis present

## 2016-05-11 DIAGNOSIS — F10239 Alcohol dependence with withdrawal, unspecified: Secondary | ICD-10-CM | POA: Diagnosis present

## 2016-05-11 DIAGNOSIS — Z9861 Coronary angioplasty status: Secondary | ICD-10-CM

## 2016-05-11 DIAGNOSIS — I639 Cerebral infarction, unspecified: Secondary | ICD-10-CM | POA: Diagnosis present

## 2016-05-11 DIAGNOSIS — I130 Hypertensive heart and chronic kidney disease with heart failure and stage 1 through stage 4 chronic kidney disease, or unspecified chronic kidney disease: Secondary | ICD-10-CM | POA: Diagnosis present

## 2016-05-11 DIAGNOSIS — D649 Anemia, unspecified: Secondary | ICD-10-CM | POA: Diagnosis present

## 2016-05-11 DIAGNOSIS — R079 Chest pain, unspecified: Secondary | ICD-10-CM | POA: Diagnosis present

## 2016-05-11 DIAGNOSIS — D500 Iron deficiency anemia secondary to blood loss (chronic): Secondary | ICD-10-CM | POA: Diagnosis present

## 2016-05-11 DIAGNOSIS — I10 Essential (primary) hypertension: Secondary | ICD-10-CM | POA: Diagnosis present

## 2016-05-11 DIAGNOSIS — R609 Edema, unspecified: Secondary | ICD-10-CM | POA: Diagnosis present

## 2016-05-11 DIAGNOSIS — Z720 Tobacco use: Secondary | ICD-10-CM | POA: Diagnosis present

## 2016-05-11 DIAGNOSIS — E1129 Type 2 diabetes mellitus with other diabetic kidney complication: Secondary | ICD-10-CM | POA: Diagnosis present

## 2016-05-11 LAB — BASIC METABOLIC PANEL
ANION GAP: 9 (ref 5–15)
BUN: 36 mg/dL — ABNORMAL HIGH (ref 6–20)
CALCIUM: 8.5 mg/dL — AB (ref 8.9–10.3)
CO2: 26 mmol/L (ref 22–32)
Chloride: 101 mmol/L (ref 101–111)
Creatinine, Ser: 2.43 mg/dL — ABNORMAL HIGH (ref 0.61–1.24)
GFR calc Af Amer: 28 mL/min — ABNORMAL LOW (ref >60)
GFR calc non Af Amer: 24 mL/min — ABNORMAL LOW (ref >60)
GLUCOSE: 325 mg/dL — AB (ref 65–99)
Potassium: 3.9 mmol/L (ref 3.5–5.1)
Sodium: 136 mmol/L (ref 135–145)

## 2016-05-11 LAB — CBC WITH DIFFERENTIAL/PLATELET
Basophils Absolute: 0 10*3/uL (ref 0.0–0.1)
Basophils Relative: 0 %
EOS PCT: 2 %
Eosinophils Absolute: 0.1 10*3/uL (ref 0.0–0.7)
HEMATOCRIT: 22.9 % — AB (ref 39.0–52.0)
Hemoglobin: 6.9 g/dL — CL (ref 13.0–17.0)
LYMPHS ABS: 0.5 10*3/uL — AB (ref 0.7–4.0)
Lymphocytes Relative: 11 %
MCH: 26.6 pg (ref 26.0–34.0)
MCHC: 30.1 g/dL (ref 30.0–36.0)
MCV: 88.4 fL (ref 78.0–100.0)
MONOS PCT: 9 %
Monocytes Absolute: 0.4 10*3/uL (ref 0.1–1.0)
NEUTROS ABS: 3.9 10*3/uL (ref 1.7–7.7)
Neutrophils Relative %: 78 %
Platelets: 232 10*3/uL (ref 150–400)
RBC: 2.59 MIL/uL — AB (ref 4.22–5.81)
RDW: 21.9 % — AB (ref 11.5–15.5)
WBC: 4.9 10*3/uL (ref 4.0–10.5)

## 2016-05-11 LAB — I-STAT TROPONIN, ED: Troponin i, poc: 0.59 ng/mL (ref 0.00–0.08)

## 2016-05-11 LAB — PROTIME-INR
INR: 1.73 — ABNORMAL HIGH (ref 0.00–1.49)
PROTHROMBIN TIME: 20.2 s — AB (ref 11.6–15.2)

## 2016-05-11 LAB — GLUCOSE, CAPILLARY
GLUCOSE-CAPILLARY: 263 mg/dL — AB (ref 65–99)
Glucose-Capillary: 301 mg/dL — ABNORMAL HIGH (ref 65–99)

## 2016-05-11 LAB — PREPARE RBC (CROSSMATCH)

## 2016-05-11 LAB — POC OCCULT BLOOD, ED: Fecal Occult Bld: POSITIVE — AB

## 2016-05-11 MED ORDER — CILOSTAZOL 50 MG PO TABS
50.0000 mg | ORAL_TABLET | Freq: Two times a day (BID) | ORAL | Status: DC
  Administered 2016-05-11 – 2016-05-14 (×5): 50 mg via ORAL
  Filled 2016-05-11 (×6): qty 1

## 2016-05-11 MED ORDER — ONDANSETRON HCL 4 MG/2ML IJ SOLN: 4.0000 mg | Freq: Three times a day (TID) | INTRAMUSCULAR | Status: AC | PRN

## 2016-05-11 MED ORDER — SIMETHICONE 80 MG PO CHEW: 80.0000 mg | CHEWABLE_TABLET | Freq: Three times a day (TID) | ORAL | Status: DC | PRN

## 2016-05-11 MED ORDER — TAMSULOSIN HCL 0.4 MG PO CAPS
0.4000 mg | ORAL_CAPSULE | Freq: Every day | ORAL | Status: DC
  Administered 2016-05-12 – 2016-05-14 (×2): 0.4 mg via ORAL
  Filled 2016-05-11 (×2): qty 1

## 2016-05-11 MED ORDER — PANTOPRAZOLE SODIUM 40 MG PO TBEC
40.0000 mg | DELAYED_RELEASE_TABLET | Freq: Every day | ORAL | Status: DC
  Administered 2016-05-12: 40 mg via ORAL
  Filled 2016-05-11: qty 1

## 2016-05-11 MED ORDER — PANTOPRAZOLE SODIUM 40 MG IV SOLR
40.0000 mg | Freq: Two times a day (BID) | INTRAVENOUS | Status: DC
  Administered 2016-05-11 – 2016-05-12 (×2): 40 mg via INTRAVENOUS
  Filled 2016-05-11 (×3): qty 40

## 2016-05-11 MED ORDER — INSULIN ASPART 100 UNIT/ML ~~LOC~~ SOLN
0.0000 [IU] | SUBCUTANEOUS | Status: DC
  Administered 2016-05-11: 5 [IU] via SUBCUTANEOUS
  Administered 2016-05-12 (×2): 7 [IU] via SUBCUTANEOUS
  Administered 2016-05-12: 3 [IU] via SUBCUTANEOUS
  Administered 2016-05-12: 7 [IU] via SUBCUTANEOUS
  Administered 2016-05-12 – 2016-05-13 (×3): 2 [IU] via SUBCUTANEOUS
  Administered 2016-05-13: 1 [IU] via SUBCUTANEOUS
  Administered 2016-05-13: 7 [IU] via SUBCUTANEOUS
  Administered 2016-05-13: 5 [IU] via SUBCUTANEOUS
  Administered 2016-05-14: 2 [IU] via SUBCUTANEOUS
  Administered 2016-05-14: 1 [IU] via SUBCUTANEOUS

## 2016-05-11 MED ORDER — ACETAMINOPHEN 325 MG PO TABS
650.0000 mg | ORAL_TABLET | Freq: Once | ORAL | Status: AC
  Administered 2016-05-11: 650 mg via ORAL
  Filled 2016-05-11: qty 2

## 2016-05-11 MED ORDER — SODIUM CHLORIDE 0.9% FLUSH
3.0000 mL | Freq: Two times a day (BID) | INTRAVENOUS | Status: DC
  Administered 2016-05-11 – 2016-05-14 (×6): 3 mL via INTRAVENOUS

## 2016-05-11 MED ORDER — ACETAMINOPHEN 325 MG PO TABS: 650.0000 mg | ORAL_TABLET | Freq: Four times a day (QID) | ORAL | Status: DC | PRN

## 2016-05-11 MED ORDER — ACETAMINOPHEN 650 MG RE SUPP: 650.0000 mg | Freq: Four times a day (QID) | RECTAL | Status: DC | PRN

## 2016-05-11 MED ORDER — METOPROLOL TARTRATE 25 MG PO TABS
25.0000 mg | ORAL_TABLET | Freq: Two times a day (BID) | ORAL | Status: DC
Start: 2016-05-11 — End: 2016-05-14
  Administered 2016-05-11 – 2016-05-14 (×5): 25 mg via ORAL
  Filled 2016-05-11 (×5): qty 1

## 2016-05-11 MED ORDER — ONDANSETRON HCL 4 MG PO TABS: 4.0000 mg | ORAL_TABLET | Freq: Four times a day (QID) | ORAL | Status: DC | PRN

## 2016-05-11 MED ORDER — GUAIFENESIN 200 MG PO TABS
400.0000 mg | ORAL_TABLET | Freq: Four times a day (QID) | ORAL | Status: DC | PRN
  Administered 2016-05-14: 400 mg via ORAL
  Filled 2016-05-11 (×2): qty 2

## 2016-05-11 MED ORDER — GABAPENTIN 300 MG PO CAPS
300.0000 mg | ORAL_CAPSULE | Freq: Two times a day (BID) | ORAL | Status: DC
Start: 2016-05-11 — End: 2016-05-14
  Administered 2016-05-11 – 2016-05-14 (×5): 300 mg via ORAL
  Filled 2016-05-11 (×5): qty 1

## 2016-05-11 MED ORDER — HYDROCODONE-ACETAMINOPHEN 5-325 MG PO TABS
1.0000 | ORAL_TABLET | ORAL | Status: DC | PRN
  Administered 2016-05-12 – 2016-05-13 (×3): 2 via ORAL
  Filled 2016-05-11 (×3): qty 2

## 2016-05-11 MED ORDER — ONDANSETRON HCL 4 MG/2ML IJ SOLN: 4.0000 mg | Freq: Four times a day (QID) | INTRAMUSCULAR | Status: DC | PRN

## 2016-05-11 MED ORDER — SODIUM CHLORIDE 0.9 % IV SOLN
8.0000 mg/h | INTRAVENOUS | Status: DC
  Administered 2016-05-11 – 2016-05-14 (×6): 8 mg/h via INTRAVENOUS
  Filled 2016-05-11 (×13): qty 80

## 2016-05-11 MED ORDER — ATORVASTATIN CALCIUM 20 MG PO TABS
20.0000 mg | ORAL_TABLET | Freq: Every day | ORAL | Status: DC
  Administered 2016-05-11 – 2016-05-13 (×3): 20 mg via ORAL
  Filled 2016-05-11 (×3): qty 1

## 2016-05-11 MED ORDER — SODIUM CHLORIDE 0.9 % IV SOLN
Freq: Once | INTRAVENOUS | Status: AC
  Administered 2016-05-11: 22:00:00 via INTRAVENOUS

## 2016-05-11 MED ORDER — DIPHENHYDRAMINE HCL 25 MG PO CAPS
25.0000 mg | ORAL_CAPSULE | Freq: Once | ORAL | Status: AC
  Administered 2016-05-11: 25 mg via ORAL
  Filled 2016-05-11: qty 1

## 2016-05-11 MED ORDER — NICOTINE 14 MG/24HR TD PT24
14.0000 mg | MEDICATED_PATCH | Freq: Every day | TRANSDERMAL | Status: DC
  Administered 2016-05-11 – 2016-05-13 (×3): 14 mg via TRANSDERMAL
  Filled 2016-05-11 (×4): qty 1

## 2016-05-11 MED ORDER — FINASTERIDE 5 MG PO TABS
5.0000 mg | ORAL_TABLET | Freq: Every day | ORAL | Status: DC
  Administered 2016-05-12 – 2016-05-14 (×2): 5 mg via ORAL
  Filled 2016-05-11 (×2): qty 1

## 2016-05-11 NOTE — ED Notes (Signed)
Left anterior chest pain, Constant non exertional non radiating acute onset 1200 no dyspnea, no reproduce with movement, happened last Friday went away at home. Hx GERD in past, no gas to change pain this time. 3/10 for EMS AFIB 90-100. 18 slock L AC, 1 Nitro, 4 baby aspirin en route per EMS pain 0/10 after. Pain 0/10 at arrival to ED.

## 2016-05-11 NOTE — Consult Note (Signed)
Reason for Consult:   Chest pain elevated Troponin  Requesting Physician: Dr Audie Pinto Primary Cardiologist   HPI:   80 yo AA male followed at [facility] with [place] significant for tobacco abuse, alcoholism, a-fib, HTN, CAD (NSTEMI 2011 with pci, EF 40-45% Aug 2015%, PAD, bipolar, CKD-3B, and CVA Aug 2015. We had not actually seen him since 2015. We did not feel he was a candidate for anticoagulation secondary to compliance, unsteady gait, and ETOH. He presented with a stroke in Aug 2015 (we did not see during that admission) and the pt was placed on Eliquis. He has had problems with GI bleeding- colonoscopy and endoscopy in Jan 2017 did not show active bleeding but he apparently had a SB study recently that did show bleeding and I believed his Eliquis was decreased. There are no records about this in EPIC.           The pt developed SSCP Friday. It waxed and waned all weekend. He took Antacids without relief. His pain got better Sunday but recurred today and he called EMS. His symptoms improved with O2. In the ED he had a Hgb of 6.9 (most recent Hgb was 10.5 on 01/26/16). His Tropon in the ED was 0.59. EKG NSR, RBBB, LAFB.   PMHx:  Past Medical History  Diagnosis Date  . Hyperlipidemia   . HTN (hypertension)     echo 2/11: EF 50-55%, diast dyfxn, severe LVH, inf HK, LAE  . CAD (coronary artery disease)     a.  NSTEMI treated with PCI Feb 2011 with a DES.([facility]);   b. cath 2/11: D1 stents x 2 ok, AV groove CFX occluded with dist AV CFX filled by L-L collats, RCA occluded, OM2 90-95% (treated with PCI)  . MI (myocardial infarction) ([place])   . PVD (peripheral vascular disease) ([place])   . DM (diabetes mellitus) ([place])   . GERD (gastroesophageal reflux disease)   . Bipolar disorder ([place])   . Prostate cancer [facility])     status post radiation therapy in 2003  . Pulmonary edema   . Atrial fibrillation ([place])   . Respiratory difficulty 06/24/2014    intubated   . Hypertensive emergency  06/24/2014  . CKD (chronic kidney disease), stage III   . Anemia 11/28/2015  . Shortness of breath dyspnea   . Stroke [facility]) 07/2014    Past Surgical History  Procedure Laterality Date  . Femoral bypass  2003  . Esophagogastroduodenoscopy N/A 11/29/2015    Procedure: ESOPHAGOGASTRODUODENOSCOPY (EGD);  Surgeon: Wonda Horner, MD;  Location: [facility] ENDOSCOPY;  Service: Endoscopy;  Laterality: N/A;  . Esophagogastroduodenoscopy (egd) with propofol Left 12/25/2015    Procedure: ESOPHAGOGASTRODUODENOSCOPY (EGD) WITH PROPOFOL;  Surgeon: Arta Silence, MD;  Location: [facility] ENDOSCOPY;  Service: Endoscopy;  Laterality: Left;  Freda Munro capsule study N/A 01/26/2016    Procedure: GIVENS CAPSULE STUDY;  Surgeon: Ronald Lobo, MD;  Location: [facility] ENDOSCOPY;  Service: Endoscopy;  Laterality: N/A;  . Colonoscopy N/A 01/25/2016    Procedure: COLONOSCOPY;  Surgeon: Ronald Lobo, MD;  Location: [facility] ENDOSCOPY;  Service: Endoscopy;  Laterality: N/A;    SOCHx:  reports that he has been smoking Cigarettes.  He has a 25 pack-year smoking history. He has never used smokeless tobacco. He reports that he drinks alcohol. He reports that he uses illicit drugs (Marijuana and Cocaine).  FAMHx: Family History  Problem Relation Age of Onset  . Cancer Mother   . Cancer Father   .  Cancer Sister     ALLERGIES: Allergies  Allergen Reactions  . Penicillins Other (See Comments)    Has patient had a PCN reaction causing immediate rash, facial/tongue/throat swelling, SOB or lightheadedness with hypotension: unknown Has patient had a PCN reaction causing severe rash involving mucus membranes or skin necrosis: unknown Has patient had a PCN reaction that required hospitalization unknown Has patient had a PCN reaction occurring within the last 10 years: unknown If all of the above answers are "NO", then may proceed with Cephalosporin use.    ROS: Review of Systems: General: negative for chills, fever, night sweats or weight  changes.  Cardiovascular: negative for orthopnea, palpitations HEENT: negative for any visual disturbances, blindness, glaucoma Dermatological: negative for rash Respiratory: negative for cough, hemoptysis, or wheezing Urologic: negative for hematuria or dysuria Abdominal: negative for nausea, vomiting, diarrhea, bright red blood per rectum, melena, or hematemesis Neurologic: negative for visual changes, syncope, or dizziness Musculoskeletal: negative for back pain, joint pain, or swelling Psych: cooperative and appropriate All other systems reviewed and are otherwise negative except as noted above.   HOME MEDICATIONS: Prior to Admission medications   Medication Sig Start Date End Date Taking? Authorizing Provider  acetaminophen (TYLENOL) 325 MG tablet Take 650 mg by mouth every 8 (eight) hours as needed for mild pain or fever (pain).    Yes Historical Provider, MD  albuterol (PROVENTIL HFA;VENTOLIN HFA) 108 (90 BASE) MCG/ACT inhaler Inhale 2 puffs into the lungs every 6 (six) hours as needed for shortness of breath.   Yes Historical Provider, MD  ammonium lactate (LAC-HYDRIN) 12 % lotion Apply 1 application topically daily.   Yes Historical Provider, MD  apixaban (ELIQUIS) 2.5 MG TABS tablet Take 2.5 mg by mouth every 12 (twelve) hours.   Yes Historical Provider, MD  atorvastatin (LIPITOR) 40 MG tablet Take 1 tablet (40 mg total) by mouth daily. Patient taking differently: Take 20 mg by mouth at bedtime.  08/01/14  Yes Sherren Mocha, MD  cholecalciferol (VITAMIN D) 1000 UNITS tablet Take 1,000 Units by mouth daily.   Yes Historical Provider, MD  cilostazol (PLETAL) 50 MG tablet Take 50 mg by mouth every 12 (twelve) hours.    Yes Historical Provider, MD  dextrose (GLUTOSE) 40 % GEL Take 1 Tube by mouth daily as needed for low blood sugar.    Yes Historical Provider, MD  feeding supplement, GLUCERNA SHAKE, (GLUCERNA SHAKE) LIQD Take 237 mLs by mouth 3 (three) times daily between meals.   Yes  Historical Provider, MD  finasteride (PROSCAR) 5 MG tablet Take 5 mg by mouth daily.     Yes Historical Provider, MD  furosemide (LASIX) 40 MG tablet Take 40 mg by mouth daily.   Yes Historical Provider, MD  gabapentin (NEURONTIN) 300 MG capsule Take 300 mg by mouth 2 (two) times daily.    Yes Historical Provider, MD  guaifenesin (HUMIBID E) 400 MG TABS tablet Take 400 mg by mouth 3 (three) times daily as needed. For congestion   Yes Historical Provider, MD  insulin regular (NOVOLIN R,HUMULIN R) 100 units/mL injection Inject 5 Units into the skin See admin instructions. Inject 5 units under the skin before breakfast and inject 5 units before evening meal   Yes Historical Provider, MD  metoprolol (LOPRESSOR) 50 MG tablet Take 50 mg by mouth 2 (two) times daily.   Yes Historical Provider, MD  Multiple Vitamins-Minerals (MULTIVITAMIN WITH MINERALS) tablet Take 1 tablet by mouth daily.   Yes Historical Provider, MD  nystatin (MYCOSTATIN)  100000 UNIT/ML suspension Take 5 mLs (500,000 Units total) by mouth 4 (four) times daily. 11/29/15  Yes Alexa Angela Burke, MD  pantoprazole (PROTONIX) 40 MG tablet Take 1 tablet (40 mg total) by mouth daily. 11/29/15  Yes Alexa Angela Burke, MD  simethicone (MYLICON) 80 MG chewable tablet Chew 80 mg by mouth 3 (three) times daily as needed for flatulence. Take with meals as needed   Yes Historical Provider, MD  tadalafil (CIALIS) 10 MG tablet Take 10 mg by mouth daily as needed for erectile dysfunction.   Yes Historical Provider, MD  Tamsulosin HCl (FLOMAX) 0.4 MG CAPS Take 0.4 mg by mouth daily.     Yes Historical Provider, MD  apixaban (ELIQUIS) 2.5 MG TABS tablet Take 1 tablet (2.5 mg total) by mouth 2 (two) times daily. Patient not taking: Reported on 05/11/2016 01/27/16   Jule Ser, DO  diclofenac sodium (VOLTAREN) 1 % GEL Apply 2 g topically 4 (four) times daily. Patient not taking: Reported on 05/11/2016 11/29/15   Florinda Marker, MD    HOSPITAL MEDICATIONS: I have  reviewed the patient's current medications.  VITALS: Blood pressure 112/58, pulse 83, temperature 98 F (36.7 C), temperature source Oral, resp. rate 22, height 6' (1.829 m), weight 182 lb (82.555 kg), SpO2 98 %.  PHYSICAL EXAM: General appearance: alert, cooperative, no distress and morbidly obese Neck: no JVD and Lt CA bruit Lungs: crackles at bases Heart: irregularly irregular rhythm Abdomen: non tender Extremities: LLE 1+ edema, trace on Rt Pulses: diminnished Skin: pale, cool, dry Neurologic: Grossly normal  LABS: Results for orders placed or performed during the hospital encounter of 05/11/16 (from the past 24 hour(s))  CBC with Differential/Platelet     Status: Abnormal   Collection Time: 05/11/16  3:56 PM  Result Value Ref Range   WBC 4.9 4.0 - 10.5 K/uL   RBC 2.59 (L) 4.22 - 5.81 MIL/uL   Hemoglobin 6.9 (LL) 13.0 - 17.0 g/dL   HCT 22.9 (L) 39.0 - 52.0 %   MCV 88.4 78.0 - 100.0 fL   MCH 26.6 26.0 - 34.0 pg   MCHC 30.1 30.0 - 36.0 g/dL   RDW 21.9 (H) 11.5 - 15.5 %   Platelets 232 150 - 400 K/uL   Neutrophils Relative % 78 %   Lymphocytes Relative 11 %   Monocytes Relative 9 %   Eosinophils Relative 2 %   Basophils Relative 0 %   Neutro Abs 3.9 1.7 - 7.7 K/uL   Lymphs Abs 0.5 (L) 0.7 - 4.0 K/uL   Monocytes Absolute 0.4 0.1 - 1.0 K/uL   Eosinophils Absolute 0.1 0.0 - 0.7 K/uL   Basophils Absolute 0.0 0.0 - 0.1 K/uL   RBC Morphology POLYCHROMASIA PRESENT   Basic metabolic panel     Status: Abnormal   Collection Time: 05/11/16  3:56 PM  Result Value Ref Range   Sodium 136 135 - 145 mmol/L   Potassium 3.9 3.5 - 5.1 mmol/L   Chloride 101 101 - 111 mmol/L   CO2 26 22 - 32 mmol/L   Glucose, Bld 325 (H) 65 - 99 mg/dL   BUN 36 (H) 6 - 20 mg/dL   Creatinine, Ser 2.43 (H) 0.61 - 1.24 mg/dL   Calcium 8.5 (L) 8.9 - 10.3 mg/dL   GFR calc non Af Amer 24 (L) >60 mL/min   GFR calc Af Amer 28 (L) >60 mL/min   Anion gap 9 5 - 15  I-stat troponin, ED     Status:  Abnormal    Collection Time: 05/11/16  4:54 PM  Result Value Ref Range   Troponin i, poc 0.59 (HH) 0.00 - 0.08 ng/mL   Comment NOTIFIED PHYSICIAN    Comment 3            EKG: NSR, RBBB, LAFB  IMAGING: Dg Chest 2 View  05/11/2016  CLINICAL DATA:  Left anterior chest pain. EXAM: CHEST  2 VIEW COMPARISON:  11/28/2015 FINDINGS: Right upper mediastinal prominence. Atherosclerotic aortic arch. Mild to moderate enlargement of the cardiopericardial silhouette with indistinct pulmonary vasculature. No airspace edema. Minimal blunting of the right costophrenic angle, as before. Faint interstitial accentuation especially in the lower lobes. IMPRESSION: 1. Mild cardiomegaly with pulmonary venous hypertension and likely interstitial edema. 2. Trace right pleural effusion. 3. Right upper mediastinal prominence is chronic and probably vascular ; this could be further confirmed with CT if clinically warranted. Electronically Signed   By: Van Clines M.D.   On: 05/11/2016 17:32    IMPRESSION: Principal Problem:   Chest pain with high risk of acute coronary syndrome Active Problems:   Anemia   GI bleed-recent SB study-AVMs   Demand ischemia (HCC)   DM (diabetes mellitus), type 2 with renal complications (HCC)   CAD- last PCI 2011   Chronic atrial fibrillation (HCC)   CKD (chronic kidney disease), stage III   Essential hypertension   Edema   Tobacco abuse   RECOMMENDATION:  Suspect chest pain and Troponin elevation from  demand ischemia secondary to anemia. Transfuse, hold anticoagulation, we'll follow.  Time Spent Directly with Patient: 40 minutes  Kerin Ransom, [place] beeper 05/11/2016, 6:17 PM   I have personally seen and examined this patient with Kerin Ransom, PA-C. I agree with the assessment and plan as outlined above. He has known CAD and chronic atrial fibrillation. He has prior GI bleeding due to ? AVMs. Most recent GI workup per pt was in January 2017 and included upper and lower  endoscopy and a capsule endoscopy. He has been on oral iron therapy. Dark stools for months. Recent chest pain. Presented to ED and found to have Hgb under 7. Exam with thin elderly male, irreg irreg, clear lungs, no LE edema. Labs reviewed. Troponin is mildly elevated. I suspect this is demand ischemia in setting of anemia with known CAD. Would hold anti-coagulation for now. He will need transfusion. Would not restart Eliquis. Cycle troponin. We will follow with you.   Lauree Chandler 05/11/2016 9:15 PM

## 2016-05-11 NOTE — ED Notes (Signed)
EKG handed to Dr Tamera Punt.

## 2016-05-11 NOTE — ED Notes (Signed)
Admitting at bedside 

## 2016-05-11 NOTE — ED Provider Notes (Signed)
CSN: [id number]     Arrival date & time 05/11/16  1549 History   First MD Initiated Contact with Patient 05/11/16 1604     Chief Complaint  Patient presents with  . Chest Pain      HPI Left anterior chest pain, Constant non exertional non radiating acute onset 1200 no dyspnea, no reproduce with movement, happened last Friday went away at home. Hx GERD in past, no gas to change pain this time. 3/10 for EMS AFIB 90-100. 18 slock L AC, 1 Nitro, 4 baby aspirin en route per EMS pain 0/10 after. Pain 0/10 at arrival to ED Past Medical History  Diagnosis Date  . Hyperlipidemia   . HTN (hypertension)     echo 2/11: EF 50-55%, diast dyfxn, severe LVH, inf HK, LAE  . CAD (coronary artery disease)     a.  NSTEMI treated with PCI Feb 2011 with a DES.([facility]);   b. cath 2/11: D1 stents x 2 ok, AV groove CFX occluded with dist AV CFX filled by L-L collats, RCA occluded, OM2 90-95% (treated with PCI)  . MI (myocardial infarction) ([place])   . PVD (peripheral vascular disease) ([place])   . DM (diabetes mellitus) ([place])   . GERD (gastroesophageal reflux disease)   . Bipolar disorder ([place])   . Prostate cancer [facility])     status post radiation therapy in 2003  . Pulmonary edema   . Atrial fibrillation ([place])   . Respiratory difficulty 06/24/2014    intubated   . Hypertensive emergency 06/24/2014  . CKD (chronic kidney disease), stage III   . Anemia 11/28/2015  . Shortness of breath dyspnea   . Stroke [facility]) 07/2014   Past Surgical History  Procedure Laterality Date  . Femoral bypass  2003  . Esophagogastroduodenoscopy N/A 11/29/2015    Procedure: ESOPHAGOGASTRODUODENOSCOPY (EGD);  Surgeon: Wonda Horner, MD;  Location: [facility] ENDOSCOPY;  Service: Endoscopy;  Laterality: N/A;  . Esophagogastroduodenoscopy (egd) with propofol Left 12/25/2015    Procedure: ESOPHAGOGASTRODUODENOSCOPY (EGD) WITH PROPOFOL;  Surgeon: Arta Silence, MD;  Location: [facility] ENDOSCOPY;  Service: Endoscopy;  Laterality: Left;  Freda Munro  capsule study N/A 01/26/2016    Procedure: GIVENS CAPSULE STUDY;  Surgeon: Ronald Lobo, MD;  Location: [facility] ENDOSCOPY;  Service: Endoscopy;  Laterality: N/A;  . Colonoscopy N/A 01/25/2016    Procedure: COLONOSCOPY;  Surgeon: Ronald Lobo, MD;  Location: [facility] ENDOSCOPY;  Service: Endoscopy;  Laterality: N/A;   Family History  Problem Relation Age of Onset  . Cancer Mother   . Cancer Father   . Cancer Sister    Social History  Substance Use Topics  . Smoking status: Current Every Day Smoker -- 0.50 packs/day for 50 years    Types: Cigarettes  . Smokeless tobacco: Never Used     Comment: He has a 50-pack-year hx of tobacco abuse. Currently, smoking about half a pack a day.  . Alcohol Use: Yes     Comment: Previously drank heavily, and now has a drink every other day or so.    Review of Systems  Constitutional: Positive for fatigue. Negative for fever and chills.  Respiratory: Positive for chest tightness and shortness of breath.       Allergies  Penicillins  Home Medications   Prior to Admission medications   Medication Sig Start Date End Date Taking? Authorizing Provider  acetaminophen (TYLENOL) 325 MG tablet Take 650 mg by mouth every 8 (eight) hours as needed for mild pain or fever (pain).    Yes Historical  Provider, MD  albuterol (PROVENTIL HFA;VENTOLIN HFA) 108 (90 BASE) MCG/ACT inhaler Inhale 2 puffs into the lungs every 6 (six) hours as needed for shortness of breath.   Yes Historical Provider, MD  ammonium lactate (LAC-HYDRIN) 12 % lotion Apply 1 application topically daily.   Yes Historical Provider, MD  apixaban (ELIQUIS) 2.5 MG TABS tablet Take 2.5 mg by mouth every 12 (twelve) hours.   Yes Historical Provider, MD  atorvastatin (LIPITOR) 40 MG tablet Take 1 tablet (40 mg total) by mouth daily. Patient taking differently: Take 20 mg by mouth at bedtime.  08/01/14  Yes Sherren Mocha, MD  cholecalciferol (VITAMIN D) 1000 UNITS tablet Take 1,000 Units by mouth daily.   Yes  Historical Provider, MD  cilostazol (PLETAL) 50 MG tablet Take 50 mg by mouth every 12 (twelve) hours.    Yes Historical Provider, MD  dextrose (GLUTOSE) 40 % GEL Take 1 Tube by mouth daily as needed for low blood sugar.    Yes Historical Provider, MD  feeding supplement, GLUCERNA SHAKE, (GLUCERNA SHAKE) LIQD Take 237 mLs by mouth 3 (three) times daily between meals.   Yes Historical Provider, MD  finasteride (PROSCAR) 5 MG tablet Take 5 mg by mouth daily.     Yes Historical Provider, MD  furosemide (LASIX) 40 MG tablet Take 40 mg by mouth daily.   Yes Historical Provider, MD  gabapentin (NEURONTIN) 300 MG capsule Take 300 mg by mouth 2 (two) times daily.    Yes Historical Provider, MD  guaifenesin (HUMIBID E) 400 MG TABS tablet Take 400 mg by mouth 3 (three) times daily as needed. For congestion   Yes Historical Provider, MD  insulin regular (NOVOLIN R,HUMULIN R) 100 units/mL injection Inject 5 Units into the skin See admin instructions. Inject 5 units under the skin before breakfast and inject 5 units before evening meal   Yes Historical Provider, MD  metoprolol (LOPRESSOR) 50 MG tablet Take 50 mg by mouth 2 (two) times daily.   Yes Historical Provider, MD  Multiple Vitamins-Minerals (MULTIVITAMIN WITH MINERALS) tablet Take 1 tablet by mouth daily.   Yes Historical Provider, MD  nystatin (MYCOSTATIN) 100000 UNIT/ML suspension Take 5 mLs (500,000 Units total) by mouth 4 (four) times daily. 11/29/15  Yes Alexa Angela Burke, MD  pantoprazole (PROTONIX) 40 MG tablet Take 1 tablet (40 mg total) by mouth daily. 11/29/15  Yes Alexa Angela Burke, MD  simethicone (MYLICON) 80 MG chewable tablet Chew 80 mg by mouth 3 (three) times daily as needed for flatulence. Take with meals as needed   Yes Historical Provider, MD  tadalafil (CIALIS) 10 MG tablet Take 10 mg by mouth daily as needed for erectile dysfunction.   Yes Historical Provider, MD  Tamsulosin HCl (FLOMAX) 0.4 MG CAPS Take 0.4 mg by mouth daily.     Yes  Historical Provider, MD  apixaban (ELIQUIS) 2.5 MG TABS tablet Take 1 tablet (2.5 mg total) by mouth 2 (two) times daily. Patient not taking: Reported on 05/11/2016 01/27/16   Jule Ser, DO  diclofenac sodium (VOLTAREN) 1 % GEL Apply 2 g topically 4 (four) times daily. Patient not taking: Reported on 05/11/2016 11/29/15   Alexa Angela Burke, MD   BP 104/71 mmHg  Pulse 68  Temp(Src) 98 F (36.7 C) (Oral)  Resp 16  Ht 6' (1.829 m)  Wt 182 lb (82.555 kg)  BMI 24.68 kg/m2  SpO2 98% Physical Exam  Constitutional: He is oriented to person, place, and time. He appears well-developed and well-nourished. No  distress.  HENT:  Head: Normocephalic and atraumatic.  Eyes: Pupils are equal, round, and reactive to light.  Neck: Normal range of motion.  Cardiovascular: Normal rate and intact distal pulses.   Pulmonary/Chest: No respiratory distress.  Abdominal: Normal appearance. He exhibits no distension. There is no tenderness. There is no rebound.  Musculoskeletal: Normal range of motion.  Neurological: He is alert and oriented to person, place, and time. No cranial nerve deficit.  Skin: Skin is warm and dry. No rash noted.  Psychiatric: He has a normal mood and affect. His behavior is normal.  Nursing note and vitals reviewed.   ED Course  Procedures (including critical care time) CRITICAL CARE Performed by: Leonard Schwartz L Total critical care time: 30 minutes Critical care time was exclusive of separately billable procedures and treating other patients. Critical care was necessary to treat or prevent imminent or life-threatening deterioration. Critical care was time spent personally by me on the following activities: development of treatment plan with patient and/or surrogate as well as nursing, discussions with consultants, evaluation of patient's response to treatment, examination of patient, obtaining history from patient or surrogate, ordering and performing treatments and interventions,  ordering and review of laboratory studies, ordering and review of radiographic studies, pulse oximetry and re-evaluation of patient's condition.  Labs Review Labs Reviewed  CBC WITH DIFFERENTIAL/PLATELET - Abnormal; Notable for the following:    RBC 2.59 (*)    Hemoglobin 6.9 (*)    HCT 22.9 (*)    RDW 21.9 (*)    Lymphs Abs 0.5 (*)    All other components within normal limits  BASIC METABOLIC PANEL - Abnormal; Notable for the following:    Glucose, Bld 325 (*)    BUN 36 (*)    Creatinine, Ser 2.43 (*)    Calcium 8.5 (*)    GFR calc non Af Amer 24 (*)    GFR calc Af Amer 28 (*)    All other components within normal limits  I-STAT TROPOININ, ED - Abnormal; Notable for the following:    Troponin i, poc 0.59 (*)    All other components within normal limits  OCCULT BLOOD X 1 CARD TO LAB, STOOL  PROTIME-INR  TYPE AND SCREEN  PREPARE RBC (CROSSMATCH)    Imaging Review Dg Chest 2 View  05/11/2016  CLINICAL DATA:  Left anterior chest pain. EXAM: CHEST  2 VIEW COMPARISON:  11/28/2015 FINDINGS: Right upper mediastinal prominence. Atherosclerotic aortic arch. Mild to moderate enlargement of the cardiopericardial silhouette with indistinct pulmonary vasculature. No airspace edema. Minimal blunting of the right costophrenic angle, as before. Faint interstitial accentuation especially in the lower lobes. IMPRESSION: 1. Mild cardiomegaly with pulmonary venous hypertension and likely interstitial edema. 2. Trace right pleural effusion. 3. Right upper mediastinal prominence is chronic and probably vascular ; this could be further confirmed with CT if clinically warranted. Electronically Signed   By: Van Clines M.D.   On: 05/11/2016 17:32   I have personally reviewed and evaluated these images and lab results as part of my medical decision-making.   EKG Interpretation   Date/Time:  Monday May 11 2016 15:52:11 EDT Ventricular Rate:  95 PR Interval:    QRS Duration: 149 QT Interval:   424 QTC Calculation: 533 R Axis:   112 Text Interpretation:  Atrial fibrillation Ventricular premature complex  RBBB and LPFB No significant change since last tracing Confirmed by Masahiro Iglesia   MD, Vian Fluegel ([id number]) on 05/11/2016 4:08:03 PM       MDM  Final diagnoses:  Chest pain with high risk of acute coronary syndrome  Anemia, unspecified anemia type        Leonard Schwartz, MD 05/11/16 [phone]

## 2016-05-11 NOTE — ED Notes (Signed)
Attempted to call report

## 2016-05-11 NOTE — H&P (Signed)
[name] OZD:[id number] DOB: [birth year] DOA: 05/11/2016   Referring MD [name] PCP: [name] May, MD   Outpatient Specialists: [name], Oncology [name], Cardiology [name] Patient coming from: independent living  Chief Complaint: melena   HPI: [name] is a 80 y.o. male with medical history significant of coronary artery disease, diastolic heart failure, hypertension, diabetes, prostate cancer, history of stroke, history of atrial fibrillation on anticoagulation, history of GI bleed    Presented with  Chest pain on the left nonexertional does not radiate started at noon today not associated with dyspnea and not reproducible bowel movement he's been having some intermittent significant chest pains ever since 4 days ago but they have been resolving on there own. Was given 4 baby aspirin by EMS and one nitroglycerin. Patient had history of GI bleeding in January had undergone colonoscopy and endoscopy by [name]  which was negative and then had small bowel study more recently that showed source of bleeding his apixiban has been decreased.  Patient continued to have some melena denies blood in stool. Reports some left upper quadrant abdominal pain. Reports cough ever since January.  Reports taking all of his medications as prescribed Reports his weight has been going down ad swelling in legs have been doing better  Regarding pertinent Chronic problems:   Patient has known history of atrial fibrillation and CVA August 2015 on apixiban. Patient had history in the past of heavy drinking currently has been doing better Patient has known history of coronary artery disease with an non-ST elevation MI in 2011 status post PCI with DES cath 2/11: D1 stents x 2 ok, AV groove CFX occluded with dist AV CFX filled by L-L collats, RCA occluded, OM2 90-95% (treated with PCI) History of prostate cancer status post radiation therapy in 2003  known history of diabetes mellitus on  humulin 5 units BID and sliding scale   IN QV:ZDGLO to have hemoglobin down to 6.9 from baseline of 10.5 in January Troponin elevated at 0.59 EKG nonischemic Chest x-ray showing vascular congestion and cardiomegaly Cardiology consult has been called and patient was seen in the emergency department He was started   blood transfusion     Hospitalist was called for admission for symptomatic anemia resulting in chest pain and elevated troponin likely secondary to demand ischemia  Review of Systems:    Pertinent positives include:  Melena,  chest pain, shortness of breath at rest.  dyspnea on exertion, productive cough,  Constitutional:  No weight loss, night sweats, Fevers, chills, fatigue, weight loss  HEENT:  No headaches, Difficulty swallowing,Tooth/dental problems,Sore throat,  No sneezing, itching, ear ache, nasal congestion, post nasal drip,  Cardio-vascular:  No, Orthopnea, PND, anasarca, dizziness, palpitations.no Bilateral lower extremity swelling  GI:  No heartburn, indigestion, abdominal pain, nausea, vomiting, diarrhea, change in bowel habits, loss of appetite blood in stool, hematemesis Resp:    No excess mucus, no  No non-productive cough, No coughing up of blood.No change in color of mucus.No wheezing. Skin:  no rash or lesions. No jaundice GU:  no dysuria, change in color of urine, no urgency or frequency. No straining to urinate.  No flank pain.  Musculoskeletal:  No joint pain or no joint swelling. No decreased range of motion. No back pain.  Psych:  No change in mood or affect. No depression or anxiety. No memory loss.  Neuro: no localizing neurological complaints, no tingling, no weakness, no double vision, no gait abnormality, no slurred speech, no confusion  As per HPI otherwise 10 point review of systems negative.   Past Medical History: Past Medical History  Diagnosis Date  . Hyperlipidemia   . HTN (hypertension)     echo 2/11: EF 50-55%, diast dyfxn,  severe LVH, inf HK, LAE  . CAD (coronary artery disease)     a.  NSTEMI treated with PCI Feb 2011 with a DES.([facility]);   b. cath 2/11: D1 stents x 2 ok, AV groove CFX occluded with dist AV CFX filled by L-L collats, RCA occluded, OM2 90-95% (treated with PCI)  . MI (myocardial infarction) ([place])   . PVD (peripheral vascular disease) ([place])   . DM (diabetes mellitus) ([place])   . GERD (gastroesophageal reflux disease)   . Bipolar disorder ([place])   . Prostate cancer [facility])     status post radiation therapy in 2003  . Pulmonary edema   . Atrial fibrillation ([place])   . Respiratory difficulty 06/24/2014    intubated   . Hypertensive emergency 06/24/2014  . CKD (chronic kidney disease), stage III   . Anemia 11/28/2015  . Shortness of breath dyspnea   . Stroke [facility]) 07/2014   Past Surgical History  Procedure Laterality Date  . Femoral bypass  2003  . Esophagogastroduodenoscopy N/A 11/29/2015    Procedure: ESOPHAGOGASTRODUODENOSCOPY (EGD);  Surgeon: [name] Horner, MD;  Location: [facility] ENDOSCOPY;  Service: Endoscopy;  Laterality: N/A;  . Esophagogastroduodenoscopy (egd) with propofol Left 12/25/2015    Procedure: ESOPHAGOGASTRODUODENOSCOPY (EGD) WITH PROPOFOL;  Surgeon: [name] Silence, MD;  Location: [facility] ENDOSCOPY;  Service: Endoscopy;  Laterality: Left;  [name] capsule study N/A 01/26/2016    Procedure: GIVENS CAPSULE STUDY;  Surgeon: [name] Lobo, MD;  Location[facility] ENDOSCOPY;  Service: Endoscopy;  Laterality: N/A;  . Colonoscopy N/A 01/25/2016    Procedure: COLONOSCOPY;  Surgeon: [name] Lobo, MD;  Location: [facility] ENDOSCOPY;  Service: Endoscopy;  Laterality: N/A;     Social History:  Ambulatory   independently or walker     From facility [facility]   reports that he has been smoking Cigarettes.  He has a 25 pack-year smoking history. He has never used smokeless tobacco. He reports that he drinks alcohol. He reports that he uses illicit drugs (Marijuana and  Cocaine).  Allergies:   Allergies  Allergen Reactions  . Penicillins Other (See Comments)    Has patient had a PCN reaction causing immediate rash, facial/tongue/throat swelling, SOB or lightheadedness with hypotension: unknown Has patient had a PCN reaction causing severe rash involving mucus membranes or skin necrosis: unknown Has patient had a PCN reaction that required hospitalization unknown Has patient had a PCN reaction occurring within the last 10 years: unknown If all of the above answers are "NO", then [name] proceed with Cephalosporin use.       Family History:    Family History  Problem Relation Age of Onset  . Cancer Mother   . Cancer Father   . Cancer Sister     Medications: Prior to Admission medications   Medication Sig Start Date End Date Taking? Authorizing Provider  acetaminophen (TYLENOL) 325 MG tablet Take 650 mg by mouth every 8 (eight) hours as needed for mild pain or fever (pain).    Yes Historical Provider, MD  albuterol (PROVENTIL HFA;VENTOLIN HFA) 108 (90 BASE) MCG/ACT inhaler Inhale 2 puffs into the lungs every 6 (six) hours as needed for shortness of breath.   Yes Historical Provider, MD  ammonium lactate (LAC-HYDRIN) 12 % lotion Apply 1 application topically daily.  Yes Historical Provider, MD  apixaban (ELIQUIS) 2.5 MG TABS tablet Take 2.5 mg by mouth every 12 (twelve) hours.   Yes Historical Provider, MD  atorvastatin (LIPITOR) 40 MG tablet Take 1 tablet (40 mg total) by mouth daily. Patient taking differently: Take 20 mg by mouth at bedtime.  08/01/14  Yes Sherren Mocha, MD  cholecalciferol (VITAMIN D) 1000 UNITS tablet Take 1,000 Units by mouth daily.   Yes Historical Provider, MD  cilostazol (PLETAL) 50 MG tablet Take 50 mg by mouth every 12 (twelve) hours.    Yes Historical Provider, MD  dextrose (GLUTOSE) 40 % GEL Take 1 Tube by mouth daily as needed for low blood sugar.    Yes Historical Provider, MD  feeding supplement, GLUCERNA SHAKE,  (GLUCERNA SHAKE) LIQD Take 237 mLs by mouth 3 (three) times daily between meals.   Yes Historical Provider, MD  finasteride (PROSCAR) 5 MG tablet Take 5 mg by mouth daily.     Yes Historical Provider, MD  furosemide (LASIX) 40 MG tablet Take 40 mg by mouth daily.   Yes Historical Provider, MD  gabapentin (NEURONTIN) 300 MG capsule Take 300 mg by mouth 2 (two) times daily.    Yes Historical Provider, MD  guaifenesin (HUMIBID E) 400 MG TABS tablet Take 400 mg by mouth 3 (three) times daily as needed. For congestion   Yes Historical Provider, MD  insulin regular (NOVOLIN R,HUMULIN R) 100 units/mL injection Inject 5 Units into the skin See admin instructions. Inject 5 units under the skin before breakfast and inject 5 units before evening meal   Yes Historical Provider, MD  metoprolol (LOPRESSOR) 50 MG tablet Take 50 mg by mouth 2 (two) times daily.   Yes Historical Provider, MD  Multiple Vitamins-Minerals (MULTIVITAMIN WITH MINERALS) tablet Take 1 tablet by mouth daily.   Yes Historical Provider, MD  nystatin (MYCOSTATIN) 100000 UNIT/ML suspension Take 5 mLs (500,000 Units total) by mouth 4 (four) times daily. 11/29/15  Yes Alexa Angela Burke, MD  pantoprazole (PROTONIX) 40 MG tablet Take 1 tablet (40 mg total) by mouth daily. 11/29/15  Yes Alexa Angela Burke, MD  simethicone (MYLICON) 80 MG chewable tablet Chew 80 mg by mouth 3 (three) times daily as needed for flatulence. Take with meals as needed   Yes Historical Provider, MD  tadalafil (CIALIS) 10 MG tablet Take 10 mg by mouth daily as needed for erectile dysfunction.   Yes Historical Provider, MD  Tamsulosin HCl (FLOMAX) 0.4 MG CAPS Take 0.4 mg by mouth daily.     Yes Historical Provider, MD  apixaban (ELIQUIS) 2.5 MG TABS tablet Take 1 tablet (2.5 mg total) by mouth 2 (two) times daily. Patient not taking: Reported on 05/11/2016 01/27/16   Jule Ser, DO  diclofenac sodium (VOLTAREN) 1 % GEL Apply 2 g topically 4 (four) times daily. Patient not taking:  Reported on 05/11/2016 11/29/15   Florinda Marker, MD    Physical Exam: Patient Vitals for the past 24 hrs:  BP Temp Temp src Pulse Resp SpO2 Height Weight  05/11/16 1935 105/60 mmHg 98.3 F (36.8 C) Oral 76 19 99 % - -  05/11/16 1919 108/67 mmHg 98.3 F (36.8 C) Oral 80 23 100 % - -  05/11/16 1900 108/59 mmHg - - 60 15 99 % - -  05/11/16 1845 118/71 mmHg - - 87 18 99 % - -  05/11/16 1830 104/71 mmHg - - 68 16 98 % - -  05/11/16 1815 (!) 113/53 mmHg - - 81  12 98 % - -  05/11/16 1800 108/60 mmHg - - 78 17 98 % - -  05/11/16 1745 105/66 mmHg - - 69 16 99 % - -  05/11/16 1730 112/58 mmHg - - 83 - 98 % - -  05/11/16 1645 111/65 mmHg - - 89 22 98 % - -  05/11/16 1630 (!) 118/46 mmHg - - 93 19 98 % - -  05/11/16 1615 108/63 mmHg - - (!) 48 17 99 % - -  05/11/16 1600 91/60 mmHg - - 91 14 97 % - -  05/11/16 1557 (!) 111/53 mmHg 98 F (36.7 C) Oral 90 18 97 % 6' (1.829 m) 82.555 kg (182 lb)  05/11/16 1554 - - - - - 97 % - -    1. General:  in No Acute distress 2. Psychological: Alert and  Oriented 3. Head/ENT:   Moist   Mucous Membranes                          Head Non traumatic, neck supple                            Poor Dentition 4. SKIN: pale normal   Skin turgor,  Skin clean Dry and intact no rash 5. Heart: Regular rate and rhythm no  Murmur, Rub or gallop 6. Lungs:   no wheezes some crackles   7. Abdomen: Soft, non-tender, Non distended 8. Lower extremities: no clubbing, cyanosis,   Edema left worse than right 9. Neurologically Grossly intact, moving all 4 extremities equally 10. MSK: Normal range of motion    body mass index is 24.68 kg/(m^2).  Labs on Admission:   Labs on Admission: I have personally reviewed following labs and imaging studies  CBC:  Recent Labs Lab 05/11/16 1556  WBC 4.9  NEUTROABS 3.9  HGB 6.9*  HCT 22.9*  MCV 88.4  PLT 856   Basic Metabolic Panel:  Recent Labs Lab 05/11/16 1556  NA 136  K 3.9  CL 101  CO2 26  GLUCOSE 325*  BUN 36*   CREATININE 2.43*  CALCIUM 8.5*   GFR: Estimated Creatinine Clearance: 27.9 mL/min (by C-G formula based on Cr of 2.43). Liver Function Tests: No results for input(s): AST, ALT, ALKPHOS, BILITOT, PROT, ALBUMIN in the last 168 hours. No results for input(s): LIPASE, AMYLASE in the last 168 hours. No results for input(s): AMMONIA in the last 168 hours. Coagulation Profile:  Recent Labs Lab 05/11/16 1823  INR 1.73*   Cardiac Enzymes: No results for input(s): CKTOTAL, CKMB, CKMBINDEX, TROPONINI in the last 168 hours. BNP (last 3 results) No results for input(s): PROBNP in the last 8760 hours. HbA1C: No results for input(s): HGBA1C in the last 72 hours. CBG: No results for input(s): GLUCAP in the last 168 hours. Lipid Profile: No results for input(s): CHOL, HDL, LDLCALC, TRIG, CHOLHDL, LDLDIRECT in the last 72 hours. Thyroid Function Tests: No results for input(s): TSH, T4TOTAL, FREET4, T3FREE, THYROIDAB in the last 72 hours. Anemia Panel: No results for input(s): VITAMINB12, FOLATE, FERRITIN, TIBC, IRON, RETICCTPCT in the last 72 hours. Urine analysis:    Component Value Date/Time   COLORURINE YELLOW 08/25/2014 1602   APPEARANCEUR CLEAR 08/25/2014 1602   LABSPEC 1.008 08/25/2014 1602   PHURINE 6.5 08/25/2014 1602   GLUCOSEU NEGATIVE 08/25/2014 1602   HGBUR TRACE* 08/25/2014 1602   BILIRUBINUR NEGATIVE 08/25/2014 1602   KETONESUR  NEGATIVE 08/25/2014 1602   PROTEINUR NEGATIVE 08/25/2014 1602   UROBILINOGEN 1.0 08/25/2014 1602   NITRITE NEGATIVE 08/25/2014 1602   LEUKOCYTESUR NEGATIVE 08/25/2014 1602   Sepsis Labs: '@LABRCNTIP'$ (procalcitonin:4,lacticidven:4) )No results found for this or any previous visit (from the past 240 hour(s)).    UA not obtained  Lab Results  Component Value Date   HGBA1C 10.4* 11/28/2015    Estimated Creatinine Clearance: 27.9 mL/min (by C-G formula based on Cr of 2.43).  BNP (last 3 results) No results for input(s): PROBNP in the last 8760  hours.   ECG REPORT  Independently reviewed Rate: 95  Rhythm: a.fib ST&T Change: No acute ischemic changes   QTC 533   Filed Weights   05/11/16 1557  Weight: 82.555 kg (182 lb)     Cultures:    Component Value Date/Time   SDES URINE, CLEAN CATCH 10/19/2014 0015   SPECREQUEST NONE 10/19/2014 0015   CULT  10/18/2014 1937    NO GROWTH 5 DAYS Performed at [facility] 10/19/2014 FINAL 10/19/2014 0015     Radiological Exams on Admission: Dg Chest 2 View  05/11/2016  CLINICAL DATA:  Left anterior chest pain. EXAM: CHEST  2 VIEW COMPARISON:  11/28/2015 FINDINGS: Right upper mediastinal prominence. Atherosclerotic aortic arch. Mild to moderate enlargement of the cardiopericardial silhouette with indistinct pulmonary vasculature. No airspace edema. Minimal blunting of the right costophrenic angle, as before. Faint interstitial accentuation especially in the lower lobes. IMPRESSION: 1. Mild cardiomegaly with pulmonary venous hypertension and likely interstitial edema. 2. Trace right pleural effusion. 3. Right upper mediastinal prominence is chronic and probably vascular ; this could be further confirmed with CT if clinically warranted. Electronically Signed   By: Van Clines M.D.   On: 05/11/2016 17:32    Chart has been reviewed    Assessment/Plan  80 y.o. male with medical history significant of coronary artery disease, diastolic heart failure, hypertension, diabetes, prostate cancer, history of stroke, history of atrial fibrillation on anticoagulation, history of GI bleed here with symptomatic anemia resulting in chest pain and elevated troponin likely secondary to demand ischemia     Present on Admission:  . Demand ischemia ([place]) continue to cycle cardiac enzymes obtain echogram to determine if there is any evidence of wall motion abnormality  . GI bleed-recent SB study-AVMs - GI consult, given melena will initiate Protonix, given symptomatic anemia  and chest admit to step, serial CBC  . Chest pain  - likely secondary to demand ischemia we'll monitor on telemetry  . Chronic atrial fibrillation (HCC) - discontinue anticoagulation given acute GI bleed currently appears to be rate controlled  . Essential hypertension -  holding parameters for metoprolol given somewhat soft bp . Tobacco abuse - getting patch and tobacco cessation  . DM (diabetes mellitus), type 2 with renal complications (HCC) - sliding scale ordered check hemoglobin A1c  . CVA (cerebral infarction)-Augb 2015 - currently off of anticoagulation secondary to acute GI bleed  . Elevated troponin likely secondary to demand ischemia cardiology aware continue to cycle likely second to demand secondary to acute blood loss anemia  . Acute blood loss anemia - transfuse 2 units and follow CBC  . Hyperlipidemia associated with type 2 diabetes mellitus ([place]) stable continue Lipitor  . Melena - GI consult and continue to monitor CBC . Acute on chronic kidney failure (HCC) hold off on Lasix for today given the low blood pressures transfuse if fluids if needed  . Acute on chronic diastolic congestive  heart failure ([place]) - evidence of some pulmonary congestion but soft blood pressures and evidence of GI bleed to avoid over aggressive diuresis for today monitor and observe     Other plan as per orders.  DVT prophylaxis:  SCD    Code Status:  FULL CODE as per patient    Family Communication:   Family not  at  Bedside    Disposition Plan:    To home once workup is complete and patient is stable   Consults called: Cardiology, [facility]  Admission status: inpatient      Level of care  SDU      I have spent a total of 64 min on this admission   extra time was spent to discuss case with Dr. Augustin Schooling 05/11/2016, 9:17 PM    [facility]  Pager [phone]   after 2 AM please page floor coverage PA If 7AM-7PM, please contact the day team taking care of the patient   [email or web address]  Password [email or web address]

## 2016-05-12 ENCOUNTER — Inpatient Hospital Stay (HOSPITAL_COMMUNITY): Payer: Medicare Other

## 2016-05-12 DIAGNOSIS — N179 Acute kidney failure, unspecified: Secondary | ICD-10-CM

## 2016-05-12 DIAGNOSIS — D649 Anemia, unspecified: Secondary | ICD-10-CM

## 2016-05-12 DIAGNOSIS — E1122 Type 2 diabetes mellitus with diabetic chronic kidney disease: Secondary | ICD-10-CM

## 2016-05-12 DIAGNOSIS — Z720 Tobacco use: Secondary | ICD-10-CM

## 2016-05-12 DIAGNOSIS — I2510 Atherosclerotic heart disease of native coronary artery without angina pectoris: Secondary | ICD-10-CM

## 2016-05-12 DIAGNOSIS — K31811 Angiodysplasia of stomach and duodenum with bleeding: Principal | ICD-10-CM

## 2016-05-12 DIAGNOSIS — N183 Chronic kidney disease, stage 3 (moderate): Secondary | ICD-10-CM

## 2016-05-12 DIAGNOSIS — I10 Essential (primary) hypertension: Secondary | ICD-10-CM

## 2016-05-12 DIAGNOSIS — E1169 Type 2 diabetes mellitus with other specified complication: Secondary | ICD-10-CM

## 2016-05-12 DIAGNOSIS — E785 Hyperlipidemia, unspecified: Secondary | ICD-10-CM

## 2016-05-12 DIAGNOSIS — N189 Chronic kidney disease, unspecified: Secondary | ICD-10-CM

## 2016-05-12 DIAGNOSIS — I482 Chronic atrial fibrillation: Secondary | ICD-10-CM

## 2016-05-12 DIAGNOSIS — I248 Other forms of acute ischemic heart disease: Secondary | ICD-10-CM

## 2016-05-12 DIAGNOSIS — I5042 Chronic combined systolic (congestive) and diastolic (congestive) heart failure: Secondary | ICD-10-CM

## 2016-05-12 DIAGNOSIS — Z9861 Coronary angioplasty status: Secondary | ICD-10-CM

## 2016-05-12 DIAGNOSIS — R079 Chest pain, unspecified: Secondary | ICD-10-CM

## 2016-05-12 LAB — PHOSPHORUS: PHOSPHORUS: 4 mg/dL (ref 2.5–4.6)

## 2016-05-12 LAB — COMPREHENSIVE METABOLIC PANEL
ALBUMIN: 2.5 g/dL — AB (ref 3.5–5.0)
ALT: 16 U/L — AB (ref 17–63)
ANION GAP: 11 (ref 5–15)
AST: 37 U/L (ref 15–41)
Alkaline Phosphatase: 46 U/L (ref 38–126)
BILIRUBIN TOTAL: 1.1 mg/dL (ref 0.3–1.2)
BUN: 39 mg/dL — AB (ref 6–20)
CHLORIDE: 102 mmol/L (ref 101–111)
CO2: 26 mmol/L (ref 22–32)
Calcium: 8.4 mg/dL — ABNORMAL LOW (ref 8.9–10.3)
Creatinine, Ser: 2.37 mg/dL — ABNORMAL HIGH (ref 0.61–1.24)
GFR, EST AFRICAN AMERICAN: 29 mL/min — AB (ref >60)
GFR, EST NON AFRICAN AMERICAN: 25 mL/min — AB (ref >60)
Glucose, Bld: 233 mg/dL — ABNORMAL HIGH (ref 65–99)
POTASSIUM: 4.1 mmol/L (ref 3.5–5.1)
SODIUM: 139 mmol/L (ref 135–145)
TOTAL PROTEIN: 6.5 g/dL (ref 6.5–8.1)

## 2016-05-12 LAB — CBC
HCT: 26.8 % — ABNORMAL LOW (ref 39.0–52.0)
HCT: 26.6 % — ABNORMAL LOW (ref 39.0–52.0)
HCT: 29.1 % — ABNORMAL LOW (ref 39.0–52.0)
HEMOGLOBIN: 9.1 g/dL — AB (ref 13.0–17.0)
Hemoglobin: 8.2 g/dL — ABNORMAL LOW (ref 13.0–17.0)
Hemoglobin: 8.4 g/dL — ABNORMAL LOW (ref 13.0–17.0)
MCH: 25.5 pg — ABNORMAL LOW (ref 26.0–34.0)
MCH: 26.5 pg (ref 26.0–34.0)
MCH: 26.5 pg (ref 26.0–34.0)
MCHC: 30.6 g/dL (ref 30.0–36.0)
MCHC: 31.6 g/dL (ref 30.0–36.0)
MCHC: 31.3 g/dL (ref 30.0–36.0)
MCV: 83.2 fL (ref 78.0–100.0)
MCV: 83.9 fL (ref 78.0–100.0)
MCV: 84.6 fL (ref 78.0–100.0)
PLATELETS: 199 10*3/uL (ref 150–400)
PLATELETS: 200 10*3/uL (ref 150–400)
PLATELETS: 222 10*3/uL (ref 150–400)
RBC: 3.22 MIL/uL — AB (ref 4.22–5.81)
RBC: 3.17 MIL/uL — ABNORMAL LOW (ref 4.22–5.81)
RBC: 3.44 MIL/uL — AB (ref 4.22–5.81)
RDW: 21.6 % — ABNORMAL HIGH (ref 11.5–15.5)
RDW: 23 % — AB (ref 11.5–15.5)
RDW: 23.2 % — ABNORMAL HIGH (ref 11.5–15.5)
WBC: 5.5 10*3/uL (ref 4.0–10.5)
WBC: 5.5 10*3/uL (ref 4.0–10.5)
WBC: 5 10*3/uL (ref 4.0–10.5)

## 2016-05-12 LAB — MRSA PCR SCREENING: MRSA BY PCR: NEGATIVE

## 2016-05-12 LAB — GLUCOSE, CAPILLARY
GLUCOSE-CAPILLARY: 194 mg/dL — AB (ref 65–99)
Glucose-Capillary: 212 mg/dL — ABNORMAL HIGH (ref 65–99)
Glucose-Capillary: 121 mg/dL — ABNORMAL HIGH (ref 65–99)
Glucose-Capillary: 175 mg/dL — ABNORMAL HIGH (ref 65–99)
Glucose-Capillary: 312 mg/dL — ABNORMAL HIGH (ref 65–99)
Glucose-Capillary: 335 mg/dL — ABNORMAL HIGH (ref 65–99)

## 2016-05-12 LAB — RAPID URINE DRUG SCREEN, HOSP PERFORMED
Amphetamines: NOT DETECTED
Barbiturates: NOT DETECTED
Benzodiazepines: NOT DETECTED
COCAINE: POSITIVE — AB
OPIATES: NOT DETECTED
TETRAHYDROCANNABINOL: POSITIVE — AB

## 2016-05-12 LAB — TYPE AND SCREEN
ABO/RH(D): O POS
ANTIBODY SCREEN: NEGATIVE
UNIT DIVISION: 0
UNIT DIVISION: 0

## 2016-05-12 LAB — TROPONIN I
TROPONIN I: 2.28 ng/mL — AB (ref <0.031)
TROPONIN I: 2.66 ng/mL — AB (ref <0.031)

## 2016-05-12 LAB — MAGNESIUM: MAGNESIUM: 1.6 mg/dL — AB (ref 1.7–2.4)

## 2016-05-12 LAB — ECHOCARDIOGRAM COMPLETE
HEIGHTINCHES: 72 in
WEIGHTICAEL: 2998.26 [oz_av]

## 2016-05-12 LAB — TSH: TSH: 0.75 u[IU]/mL (ref 0.350–4.500)

## 2016-05-12 MED ORDER — PANTOPRAZOLE SODIUM 40 MG PO TBEC: 40.0000 mg | DELAYED_RELEASE_TABLET | Freq: Every day | ORAL | Status: DC

## 2016-05-12 MED ORDER — HYDROXYZINE HCL 25 MG PO TABS
25.0000 mg | ORAL_TABLET | Freq: Three times a day (TID) | ORAL | Status: DC | PRN
  Administered 2016-05-12: 25 mg via ORAL
  Filled 2016-05-12: qty 1

## 2016-05-12 NOTE — Progress Notes (Signed)
Patient Name: [name] Date of Encounter: 05/12/2016  Principal Problem:   Chest pain with high risk of acute coronary syndrome Active Problems:   DM (diabetes mellitus), type 2 with renal complications ([place])   Hyperlipidemia associated with type 2 diabetes mellitus ([place])   Essential hypertension   CAD- last PCI 2011   Chest pain, unspecified   Edema   Chronic atrial fibrillation (HCC)   Tobacco abuse   Acute on chronic diastolic congestive heart failure (HCC)   CKD (chronic kidney disease), stage III   CVA (cerebral infarction)-Augb 2015   Anemia   GIB (gastrointestinal bleeding)   GI bleed-recent SB study-AVMs   Melena   Demand ischemia (HCC)   Chronic anticoagulation-Eliquis   Elevated troponin   Acute blood loss anemia   Acute on chronic kidney failure (HCC)   Symptomatic anemia   Length of Stay: 1  SUBJECTIVE  No angina, dyspnea or overt bleeding.  CURRENT MEDS . atorvastatin  20 mg Oral QHS  . cilostazol  50 mg Oral Q12H  . finasteride  5 mg Oral Daily  . gabapentin  300 mg Oral BID  . insulin aspart  0-9 Units Subcutaneous Q4H  . metoprolol  25 mg Oral BID  . nicotine  14 mg Transdermal Daily  . pantoprazole  40 mg Oral Daily  . pantoprazole (PROTONIX) IV  40 mg Intravenous Q12H  . sodium chloride flush  3 mL Intravenous Q12H  . tamsulosin  0.4 mg Oral Daily    OBJECTIVE   Intake/Output Summary (Last 24 hours) at 05/12/16 1012 Last data filed at 05/12/16 0600  Gross per 24 hour  Intake 1129.17 ml  Output      0 ml  Net 1129.17 ml   Filed Weights   05/11/16 1557 05/11/16 2035  Weight: 82.555 kg (182 lb) 85 kg (187 lb 6.3 oz)    PHYSICAL EXAM Filed Vitals:   05/12/16 0100 05/12/16 0122 05/12/16 0410 05/12/16 0800  BP:  116/68 118/68 120/62  Pulse:  80 78 38  Temp:  97.1 F (36.2 C) 97.4 F (36.3 C) 97.8 F (36.6 C)  TempSrc:  Oral Oral Oral  Resp:  '16 26 20  '$ Height:      Weight:      SpO2: 98% 98% 97% 98%   General: Alert,  oriented x3, no distress Head: no evidence of trauma, PERRL, EOMI, no exophtalmos or lid lag, no myxedema, no xanthelasma; normal ears, nose and oropharynx Neck: normal jugular venous pulsations and no hepatojugular reflux; brisk carotid pulses without delay and no carotid bruits Chest: clear to auscultation, no signs of consolidation by percussion or palpation, normal fremitus, symmetrical and full respiratory excursions Cardiovascular: normal position and quality of the apical impulse, irregular rhythm, normal first and widely split second heart sounds, no rubs or gallops, no murmur Abdomen: no tenderness or distention, no masses by palpation, no abnormal pulsatility or arterial bruits, normal bowel sounds, no hepatosplenomegaly Extremities: no clubbing, cyanosis or edema; 2+ radial, ulnar and brachial pulses bilaterally; 2+ right femoral, posterior tibial and dorsalis pedis pulses; 2+ left femoral, posterior tibial and dorsalis pedis pulses; no subclavian or femoral bruits Neurological: grossly nonfocal  LABS  CBC  Recent Labs  05/11/16 1556 05/12/16 0322  WBC 4.9 5.5  NEUTROABS 3.9  --   HGB 6.9* 8.2*  HCT 22.9* 26.8*  MCV 88.4 83.2  PLT 232 400   Basic Metabolic Panel  Recent Labs  05/11/16 1556 05/12/16 0322  NA 136 139  K 3.9 4.1  CL 101 102  CO2 26 26  GLUCOSE 325* 233*  BUN 36* 39*  CREATININE 2.43* 2.37*  CALCIUM 8.5* 8.4*  MG  --  1.6*  PHOS  --  4.0   Liver Function Tests  Recent Labs  05/12/16 0322  AST 37  ALT 16*  ALKPHOS 46  BILITOT 1.1  PROT 6.5  ALBUMIN 2.5*   No results for input(s): LIPASE, AMYLASE in the last 72 hours. Cardiac Enzymes  Recent Labs  05/12/16 0322  TROPONINI 2.28*   Thyroid Function Tests  Recent Labs  05/12/16 0320  TSH 0.750    Radiology Studies Imaging results have been reviewed and Dg Chest 2 View  05/11/2016  CLINICAL DATA:  Left anterior chest pain. EXAM: CHEST  2 VIEW COMPARISON:  11/28/2015 FINDINGS:  Right upper mediastinal prominence. Atherosclerotic aortic arch. Mild to moderate enlargement of the cardiopericardial silhouette with indistinct pulmonary vasculature. No airspace edema. Minimal blunting of the right costophrenic angle, as before. Faint interstitial accentuation especially in the lower lobes. IMPRESSION: 1. Mild cardiomegaly with pulmonary venous hypertension and likely interstitial edema. 2. Trace right pleural effusion. 3. Right upper mediastinal prominence is chronic and probably vascular ; this could be further confirmed with CT if clinically warranted. Electronically Signed   By: Van Clines M.D.   On: 05/11/2016 17:32    TELE Afib with incessant PVCs  ECG AF, old septal infarction  ASSESSMENT AND PLAN  1. Demand ischemia with small NSTEMI Very challenging situation due to renal insufficiency and active bleeding. After transfusion he is asymptomatic. No plan for invasive evaluation or treatment on this admission. Consider outpatient nuclear study and only perform cath if there is a very large area of threatening ischemia.  2. Persistent atrial fibrillation  Rate controlled. Very high stroke risk: This patients CHA2DS2-VASc Score and unadjusted Ischemic Stroke Rate (% per year) is equal to 10.8 % stroke rate/year from a score of 8 Above score calculated as 1 point each if present [CHF, HTN, DM, hx CAD and MI, Age > 39, history of Stroke]. Discuss with Dr. Burt Knack whether he would be a good Watchman candidate (issues of advanced age, alcohol abuse and still need for temporary warfarin anticoagulation).  I would not restart long term anticoagulation otherwise.  3. Chronic systolic and diastolic heart failure EF was 40-45% in 2015, inferior-inferolateral hypokinesis, moderate MR. Repeat echo pending. Appears clinically euvolemic today.  4. CAD s/p LCX STEMI 2011  5. 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 Improved Hgb  after transfusion.  6. Acute on CKD stage 3-4 Probably only slightly worse than baseline, but another problem that makes angiography very unappealing.  7. Alcohol abuse He buys 3-4 half gallons of liquour every month. He enjoys drinking and does not plan to stop. Watch for symptoms of withdrawal.  8. Tobacco abuse He understands that this may be deleterious to his health, but since he has seen no convincing adverse events (in his opinion) and since "he is so old" , he has no intention of quitting.  9. Severe PAD 2014 - right ABI is 0.39, left 0.75. Segmental pressures suggest significant bilateral iliofemoral occlusive disease, left infrapopliteal and multisegmental right occlusive disease. Triphasic waveforms at the left femoral level, monophasic throughout their more distal left lower extremity and throughout the right lower extremity. No significant carotid disease 2015 [place].   Sanda Klein, MD, [facility] [phone] office [phone] pager 05/12/2016 10:12 AM

## 2016-05-12 NOTE — Progress Notes (Signed)
  Echocardiogram 2D Echocardiogram has been performed.  [name] 05/12/2016, 11:37 AM

## 2016-05-12 NOTE — Progress Notes (Signed)
PROGRESS NOTE                                                                                                                                                                                                             Patient Demographics:    [name], is a 81 y.o. male, DOB - Jul 16, 1936, GLO:[id number]  Admit date - 05/11/2016   Admitting Physician Toy Baker, MD  Outpatient Primary MD for the patient is Beacher May, MD  LOS - 1   Chief Complaint  Patient presents with  . Chest Pain       Brief Narrative  Pt is a 80 y/o    Subjective:    [name] 42 male with medical history significant of coronary artery disease, diastolic heart failure, hypertension, diabetes, prostate cancer, history of stroke, history of atrial fibrillation on anticoagulation, history of GI bleed   Presented with  Chest pain on the left nonexertional does not radiate started at noon today not associated with dyspnea and not reproducible bowel movement he's been having some intermittent significant chest pains ever since 4 days ago but they have been resolving on there own. Was given 4 baby aspirin by EMS and one nitroglycerin. Patient had history of GI bleeding in January had undergone colonoscopy and endoscopy by Howie Ill which was negative and then had small bowel study more recently that showed source of bleeding his apixiban has been decreased.  Patient continued to have some melena denies blood in stool. Reports some left upper quadrant abdominal pain. Reports cough ever since January.    Assessment  & Plan :      Present on Admission:  . Demand ischemia [facility]) : Echocardiogram ordered, cardiology on board, troponins elevated.  . GI bleed-recent SB study-AVMs - GI consulted, GI awaiting cardiology clearance for further evaluation and recommendations.  . Chronic atrial fibrillation (HCC) - discontinue  anticoagulation given acute GI bleed currently appears to be rate controlled , cardiology assisting.  . Essential hypertension - holding parameters for metoprolol given somewhat soft bp . Tobacco abuse - getting patch and tobacco cessation  . DM (diabetes mellitus), type 2 with renal complications (HCC) - sliding scale ordered check hemoglobin A1c  . CVA (cerebral infarction)-Augb 2015 - currently off of anticoagulation secondary to acute GI bleed  . Acute blood loss anemia - transfused  2 units and follow CBC  . Hyperlipidemia associated with type 2 diabetes mellitus ([place]) stable continue Lipitor  . Melena - GI consult and continue to monitor CBC . Acute on chronic kidney failure (HCC) hold off on Lasix for today given the low blood pressures transfuse if fluids if needed  . Acute on chronic diastolic congestive heart failure (HCC) - evidence of some pulmonary congestion but soft blood pressures and evidence of GI bleed to avoid over aggressive diuresis for today monitor and observe    Code Status : full  Family Communication  : None at bedside  Disposition Plan  : Pending work up  Consults  :  Gastroenterology Cardiology  Procedures  : None  DVT Prophylaxis  :SCDs   Lab Results  Component Value Date   PLT 200 05/12/2016    Antibiotics  :   Anti-infectives    None        Objective:   Filed Vitals:   05/12/16 0122 05/12/16 0410 05/12/16 0800 05/12/16 1200  BP: 116/68 118/68 120/62 104/43  Pulse: 80 78 38 37  Temp: 97.1 F (36.2 C) 97.4 F (36.3 C) 97.8 F (36.6 C) 97.3 F (36.3 C)  TempSrc: Oral Oral Oral Oral  Resp: '16 26 20 15  '$ Height:      Weight:      SpO2: 98% 97% 98% 94%    Wt Readings from Last 3 Encounters:  05/11/16 85 kg (187 lb 6.3 oz)  01/26/16 88.451 kg (195 lb)  12/25/15 84.823 kg (187 lb)     Intake/Output Summary (Last 24 hours) at 05/12/16 1517 Last data filed at 05/12/16 0600  Gross per 24 hour  Intake 1129.17 ml  Output      0 ml    Net 1129.17 ml     Physical Exam  Awake Alert, in NAD  Supple Neck,No JVD, No cervical lymphadenopathy appreciated.  Symmetrical Chest wall movement, Good air movement bilaterally, CTAB s1 and s2 wnl,No Gallops,Rubs  +ve B.Sounds, Abd Soft, No tenderness, No organomegaly appriciated, No rebound - guarding or rigidity. No Cyanosis, Clubbing or edema, No new Rash or bruise      Data Review:    CBC  Recent Labs Lab 05/11/16 1556 05/12/16 0322 05/12/16 0956  WBC 4.9 5.5 5.5  HGB 6.9* 8.2* 8.4*  HCT 22.9* 26.8* 26.6*  PLT 232 199 200  MCV 88.4 83.2 83.9  MCH 26.6 25.5* 26.5  MCHC 30.1 30.6 31.6  RDW 21.9* 21.6* 23.0*  LYMPHSABS 0.5*  --   --   MONOABS 0.4  --   --   EOSABS 0.1  --   --   BASOSABS 0.0  --   --     Chemistries   Recent Labs Lab 05/11/16 1556 05/12/16 0322  NA 136 139  K 3.9 4.1  CL 101 102  CO2 26 26  GLUCOSE 325* 233*  BUN 36* 39*  CREATININE 2.43* 2.37*  CALCIUM 8.5* 8.4*  MG  --  1.6*  AST  --  37  ALT  --  16*  ALKPHOS  --  46  BILITOT  --  1.1   ------------------------------------------------------------------------------------------------------------------ No results for input(s): CHOL, HDL, LDLCALC, TRIG, CHOLHDL, LDLDIRECT in the last 72 hours.  Lab Results  Component Value Date   HGBA1C 10.4* 11/28/2015   ------------------------------------------------------------------------------------------------------------------  Recent Labs  05/12/16 0320  TSH 0.750   ------------------------------------------------------------------------------------------------------------------ No results for input(s): VITAMINB12, FOLATE, FERRITIN, TIBC, IRON, RETICCTPCT in the last 72 hours.  Coagulation  profile  Recent Labs Lab 05/11/16 1823  INR 1.73*    No results for input(s): DDIMER in the last 72 hours.  Cardiac Enzymes  Recent Labs Lab 05/12/16 0322 05/12/16 0956  TROPONINI 2.28* 2.66*    ------------------------------------------------------------------------------------------------------------------ No results found for: BNP  Inpatient Medications  Scheduled Meds: . atorvastatin  20 mg Oral QHS  . cilostazol  50 mg Oral Q12H  . finasteride  5 mg Oral Daily  . gabapentin  300 mg Oral BID  . insulin aspart  0-9 Units Subcutaneous Q4H  . metoprolol  25 mg Oral BID  . nicotine  14 mg Transdermal Daily  . pantoprazole  40 mg Oral Daily  . pantoprazole (PROTONIX) IV  40 mg Intravenous Q12H  . sodium chloride flush  3 mL Intravenous Q12H  . tamsulosin  0.4 mg Oral Daily   Continuous Infusions: . pantoprozole (PROTONIX) infusion 8 mg/hr (05/12/16 0454)   PRN Meds:.acetaminophen **OR** acetaminophen, guaiFENesin, HYDROcodone-acetaminophen, ondansetron **OR** ondansetron (ZOFRAN) IV, simethicone  Micro Results Recent Results (from the past 240 hour(s))  MRSA PCR Screening     Status: None   Collection Time: 05/11/16  9:05 PM  Result Value Ref Range Status   MRSA by PCR NEGATIVE NEGATIVE Final    Comment:        The GeneXpert MRSA Assay (FDA approved for NASAL specimens only), is one component of a comprehensive MRSA colonization surveillance program. It is not intended to diagnose MRSA infection nor to guide or monitor treatment for MRSA infections.     Radiology Reports Dg Chest 2 View  05/11/2016  CLINICAL DATA:  Left anterior chest pain. EXAM: CHEST  2 VIEW COMPARISON:  11/28/2015 FINDINGS: Right upper mediastinal prominence. Atherosclerotic aortic arch. Mild to moderate enlargement of the cardiopericardial silhouette with indistinct pulmonary vasculature. No airspace edema. Minimal blunting of the right costophrenic angle, as before. Faint interstitial accentuation especially in the lower lobes. IMPRESSION: 1. Mild cardiomegaly with pulmonary venous hypertension and likely interstitial edema. 2. Trace right pleural effusion. 3. Right upper mediastinal  prominence is chronic and probably vascular ; this could be further confirmed with CT if clinically warranted. Electronically Signed   By: Van Clines M.D.   On: 05/11/2016 17:32    Time Spent in minutes  35   Velvet Bathe M.D on 05/12/2016 at 3:17 PM  Between 7am to 7pm - Pager - [phone]  After 7pm go to [email or web address] - password [email or web address]  [facility] -  Office  [phone]

## 2016-05-12 NOTE — Consult Note (Signed)
[facility] CONSULT Reason for consult: G.I. bleeding Referring Physician: [facility]. PCP: Dr. Beacher May. Primary G.I.: Dr. Genice Rouge [name] is an 80 y.o. male.  HPI: he has a history of chronic G.I. bleeding as well as a history of diabetes prostate cancer and has had a history of significant cardiac problems including CAD and CHF with atrial fibrillation requiring anticoagulation in history of previous strokes. He has been anticoagulated and has had G.I. bleeding requiring admission. 12/16 and 1/17 he had to EGD's colonoscopy due blood in the stool. EGD showed small nonbleeding AVMs in the stomach and duodenum. Colonoscopy was basically normal. The patient was seen by Dr. Cristina Gong in the office approximately 3 weeks ago and had a small bowel capsule endoscopy showing in AVM in the distal small bowel. Dr. Cristina Gong started him on chronic iron therapy. He's only been on iron for several days and his stools of been black. He has attributed this to the iron. He presented to the emergency room with chest pain and was found to have a hemoglobin a 6.9. It previously been 10.7 about a month ago in Dr. Osborn Coho office. Patient denies any abdominal pain. He has been transfused to 8.2. This troponin was elevated, EKG showed atrial fibrillation and stools did test positive for blood. Patient notes this morning his chest pain is better. He is not had been seen by cardiology.  Past Medical History  Diagnosis Date  . Hyperlipidemia   . HTN (hypertension)     echo 2/11: EF 50-55%, diast dyfxn, severe LVH, inf HK, LAE  . CAD (coronary artery disease)     a.  NSTEMI treated with PCI Feb 2011 with a DES.([facility]);   b. cath 2/11: D1 stents x 2 ok, AV groove CFX occluded with dist AV CFX filled by [name]-[name] collats, RCA occluded, OM2 90-95% (treated with PCI)  . MI (myocardial infarction) ([place])   . PVD (peripheral vascular disease) ([place])   . DM (diabetes mellitus) ([place])   . GERD  (gastroesophageal reflux disease)   . Bipolar disorder ([place])   . Prostate cancer [facility])     status post radiation therapy in 2003  . Pulmonary edema   . Atrial fibrillation ([place])   . Respiratory difficulty 06/24/2014    intubated   . Hypertensive emergency 06/24/2014  . CKD (chronic kidney disease), stage III   . Anemia 11/28/2015  . Shortness of breath dyspnea   . Stroke [facility]) 07/2014    Past Surgical History  Procedure Laterality Date  . Femoral bypass  2003  . Esophagogastroduodenoscopy N/A 11/29/2015    Procedure: ESOPHAGOGASTRODUODENOSCOPY (EGD);  Surgeon: Wonda Horner, MD;  Location: [facility] ENDOSCOPY;  Service: Endoscopy;  Laterality: N/A;  . Esophagogastroduodenoscopy (egd) with propofol Left 12/25/2015    Procedure: ESOPHAGOGASTRODUODENOSCOPY (EGD) WITH PROPOFOL;  Surgeon: Arta Silence, MD;  Location[facility] ENDOSCOPY;  Service: Endoscopy;  Laterality: Left;  Freda Munro capsule study N/A 01/26/2016    Procedure: GIVENS CAPSULE STUDY;  Surgeon: Ronald Lobo, MD;  Location: [facility] ENDOSCOPY;  Service: Endoscopy;  Laterality: N/A;  . Colonoscopy N/A 01/25/2016    Procedure: COLONOSCOPY;  Surgeon: Ronald Lobo, MD;  Location: [facility] ENDOSCOPY;  Service: Endoscopy;  Laterality: N/A;    Family History  Problem Relation Age of Onset  . Cancer Mother   . Cancer Father   . Cancer Sister     Social History:  reports that he has been smoking Cigarettes.  He has a 25 pack-year smoking history. He has never  used smokeless tobacco. He reports that he drinks alcohol. He reports that he uses illicit drugs (Marijuana and Cocaine).  Allergies:  Allergies  Allergen Reactions  . Penicillins Other (See Comments)    Has patient had a PCN reaction causing immediate rash, facial/tongue/throat swelling, SOB or lightheadedness with hypotension: unknown Has patient had a PCN reaction causing severe rash involving mucus membranes or skin necrosis: unknown Has patient had a PCN reaction that required  hospitalization unknown Has patient had a PCN reaction occurring within the last 10 years: unknown If all of the above answers are "NO", then may proceed with Cephalosporin use.    Medications; Prior to Admission medications   Medication Sig Start Date End Date Taking? Authorizing Provider  acetaminophen (TYLENOL) 325 MG tablet Take 650 mg by mouth every 8 (eight) hours as needed for mild pain or fever (pain).    Yes Historical Provider, MD  albuterol (PROVENTIL HFA;VENTOLIN HFA) 108 (90 BASE) MCG/ACT inhaler Inhale 2 puffs into the lungs every 6 (six) hours as needed for shortness of breath.   Yes Historical Provider, MD  ammonium lactate (LAC-HYDRIN) 12 % lotion Apply 1 application topically daily.   Yes Historical Provider, MD  apixaban (ELIQUIS) 2.5 MG TABS tablet Take 2.5 mg by mouth every 12 (twelve) hours.   Yes Historical Provider, MD  atorvastatin (LIPITOR) 40 MG tablet Take 1 tablet (40 mg total) by mouth daily. Patient taking differently: Take 20 mg by mouth at bedtime.  08/01/14  Yes Sherren Mocha, MD  cholecalciferol (VITAMIN D) 1000 UNITS tablet Take 1,000 Units by mouth daily.   Yes Historical Provider, MD  cilostazol (PLETAL) 50 MG tablet Take 50 mg by mouth every 12 (twelve) hours.    Yes Historical Provider, MD  dextrose (GLUTOSE) 40 % GEL Take 1 Tube by mouth daily as needed for low blood sugar.    Yes Historical Provider, MD  feeding supplement, GLUCERNA SHAKE, (GLUCERNA SHAKE) LIQD Take 237 mLs by mouth 3 (three) times daily between meals.   Yes Historical Provider, MD  finasteride (PROSCAR) 5 MG tablet Take 5 mg by mouth daily.     Yes Historical Provider, MD  furosemide (LASIX) 40 MG tablet Take 40 mg by mouth daily.   Yes Historical Provider, MD  gabapentin (NEURONTIN) 300 MG capsule Take 300 mg by mouth 2 (two) times daily.    Yes Historical Provider, MD  guaifenesin (HUMIBID E) 400 MG TABS tablet Take 400 mg by mouth 3 (three) times daily as needed. For congestion   Yes  Historical Provider, MD  insulin regular (NOVOLIN R,HUMULIN R) 100 units/mL injection Inject 5 Units into the skin See admin instructions. Inject 5 units under the skin before breakfast and inject 5 units before evening meal   Yes Historical Provider, MD  metoprolol (LOPRESSOR) 50 MG tablet Take 50 mg by mouth 2 (two) times daily.   Yes Historical Provider, MD  Multiple Vitamins-Minerals (MULTIVITAMIN WITH MINERALS) tablet Take 1 tablet by mouth daily.   Yes Historical Provider, MD  nystatin (MYCOSTATIN) 100000 UNIT/ML suspension Take 5 mLs (500,000 Units total) by mouth 4 (four) times daily. 11/29/15  Yes Alexa Angela Burke, MD  pantoprazole (PROTONIX) 40 MG tablet Take 1 tablet (40 mg total) by mouth daily. 11/29/15  Yes Alexa Angela Burke, MD  simethicone (MYLICON) 80 MG chewable tablet Chew 80 mg by mouth 3 (three) times daily as needed for flatulence. Take with meals as needed   Yes Historical Provider, MD  tadalafil (CIALIS) 10 MG  tablet Take 10 mg by mouth daily as needed for erectile dysfunction.   Yes Historical Provider, MD  Tamsulosin HCl (FLOMAX) 0.4 MG CAPS Take 0.4 mg by mouth daily.     Yes Historical Provider, MD  apixaban (ELIQUIS) 2.5 MG TABS tablet Take 1 tablet (2.5 mg total) by mouth 2 (two) times daily. Patient not taking: Reported on 05/11/2016 01/27/16   Jule Ser, DO  diclofenac sodium (VOLTAREN) 1 % GEL Apply 2 g topically 4 (four) times daily. Patient not taking: Reported on 05/11/2016 11/29/15   Alexa Angela Burke, MD   . atorvastatin  20 mg Oral QHS  . cilostazol  50 mg Oral Q12H  . finasteride  5 mg Oral Daily  . gabapentin  300 mg Oral BID  . insulin aspart  0-9 Units Subcutaneous Q4H  . metoprolol  25 mg Oral BID  . nicotine  14 mg Transdermal Daily  . pantoprazole  40 mg Oral Daily  . pantoprazole (PROTONIX) IV  40 mg Intravenous Q12H  . sodium chloride flush  3 mL Intravenous Q12H  . tamsulosin  0.4 mg Oral Daily   PRN Meds acetaminophen **OR** acetaminophen, guaiFENesin,  HYDROcodone-acetaminophen, ondansetron **OR** ondansetron (ZOFRAN) IV, simethicone Results for orders placed or performed during the hospital encounter of 05/11/16 (from the past 48 hour(s))  CBC with Differential/Platelet     Status: Abnormal   Collection Time: 05/11/16  3:56 PM  Result Value Ref Range   WBC 4.9 4.0 - 10.5 K/uL   RBC 2.59 ([name]) 4.22 - 5.81 MIL/uL   Hemoglobin 6.9 (LL) 13.0 - 17.0 g/dL    Comment: REPEATED TO VERIFY CRITICAL RESULT CALLED TO, READ BACK BY AND VERIFIED WITH: Joanell Rising RN [phone] GREEN R    HCT 22.9 ([name]) 39.0 - 52.0 %   MCV 88.4 78.0 - 100.0 fL   MCH 26.6 26.0 - 34.0 pg   MCHC 30.1 30.0 - 36.0 g/dL   RDW 21.9 (H) 11.5 - 15.5 %   Platelets 232 150 - 400 K/uL   Neutrophils Relative % 78 %   Lymphocytes Relative 11 %   Monocytes Relative 9 %   Eosinophils Relative 2 %   Basophils Relative 0 %   Neutro Abs 3.9 1.7 - 7.7 K/uL   Lymphs Abs 0.5 ([name]) 0.7 - 4.0 K/uL   Monocytes Absolute 0.4 0.1 - 1.0 K/uL   Eosinophils Absolute 0.1 0.0 - 0.7 K/uL   Basophils Absolute 0.0 0.0 - 0.1 K/uL   RBC Morphology POLYCHROMASIA PRESENT     Comment: TARGET CELLS  Basic metabolic panel     Status: Abnormal   Collection Time: 05/11/16  3:56 PM  Result Value Ref Range   Sodium 136 135 - 145 mmol/[name]   Potassium 3.9 3.5 - 5.1 mmol/[name]   Chloride 101 101 - 111 mmol/[name]   CO2 26 22 - 32 mmol/[name]   Glucose, Bld 325 (H) 65 - 99 mg/dL   BUN 36 (H) 6 - 20 mg/dL   Creatinine, Ser 2.43 (H) 0.61 - 1.24 mg/dL   Calcium 8.5 ([name]) 8.9 - 10.3 mg/dL   GFR calc non Af Amer 24 ([name]) >60 mL/min   GFR calc Af Amer 28 ([name]) >60 mL/min    Comment: (NOTE) The eGFR has been calculated using the CKD EPI equation. This calculation has not been validated in all clinical situations. eGFR's persistently <60 mL/min signify possible Chronic Kidney Disease.    Anion gap 9 5 - 15  I-stat troponin, ED  Status: Abnormal   Collection Time: 05/11/16  4:54 PM  Result Value Ref Range   Troponin i, poc 0.59  (HH) 0.00 - 0.08 ng/mL   Comment NOTIFIED PHYSICIAN    Comment 3            Comment: Due to the release kinetics of cTnI, a negative result within the first hours of the onset of symptoms does not rule out myocardial infarction with certainty. If myocardial infarction is still suspected, repeat the test at appropriate intervals.   Prepare RBC     Status: None   Collection Time: 05/11/16  5:34 PM  Result Value Ref Range   Order Confirmation ORDER PROCESSED BY BLOOD BANK   Type and screen [place]     Status: None   Collection Time: 05/11/16  5:45 PM  Result Value Ref Range   ABO/RH(D) O POS    Antibody Screen NEG    Sample Expiration 05/14/2016    Unit Number [id number]    Blood Component Type RED CELLS,LR    Unit division 00    Status of Unit ISSUED,FINAL    Transfusion Status OK TO TRANSFUSE    Crossmatch Result Compatible    Unit Number [id number]    Blood Component Type RED CELLS,LR    Unit division 00    Status of Unit ISSUED,FINAL    Transfusion Status OK TO TRANSFUSE    Crossmatch Result Compatible   Protime-INR - (order if Patient is taking Coumadin / Warfarin)     Status: Abnormal   Collection Time: 05/11/16  6:23 PM  Result Value Ref Range   Prothrombin Time 20.2 (H) 11.6 - 15.2 seconds   INR 1.73 (H) 0.00 - 1.49  POC occult blood, ED     Status: Abnormal   Collection Time: 05/11/16  7:55 PM  Result Value Ref Range   Fecal Occult Bld POSITIVE (A) NEGATIVE  MRSA PCR Screening     Status: None   Collection Time: 05/11/16  9:05 PM  Result Value Ref Range   MRSA by PCR NEGATIVE NEGATIVE    Comment:        The GeneXpert MRSA Assay (FDA approved for NASAL specimens only), is one component of a comprehensive MRSA colonization surveillance program. It is not intended to diagnose MRSA infection nor to guide or monitor treatment for MRSA infections.   Glucose, capillary     Status: Abnormal   Collection Time: 05/11/16  9:48 PM  Result  Value Ref Range   Glucose-Capillary 263 (H) 65 - 99 mg/dL  Glucose, capillary     Status: Abnormal   Collection Time: 05/11/16 11:44 PM  Result Value Ref Range   Glucose-Capillary 301 (H) 65 - 99 mg/dL  TSH     Status: None   Collection Time: 05/12/16  3:20 AM  Result Value Ref Range   TSH 0.750 0.350 - 4.500 uIU/mL  CBC     Status: Abnormal   Collection Time: 05/12/16  3:22 AM  Result Value Ref Range   WBC 5.5 4.0 - 10.5 K/uL   RBC 3.22 ([name]) 4.22 - 5.81 MIL/uL   Hemoglobin 8.2 ([name]) 13.0 - 17.0 g/dL   HCT 26.8 ([name]) 39.0 - 52.0 %   MCV 83.2 78.0 - 100.0 fL   MCH 25.5 ([name]) 26.0 - 34.0 pg   MCHC 30.6 30.0 - 36.0 g/dL   RDW 21.6 (H) 11.5 - 15.5 %   Platelets 199 150 - 400 K/uL  Comprehensive metabolic panel  Status: Abnormal   Collection Time: 05/12/16  3:22 AM  Result Value Ref Range   Sodium 139 135 - 145 mmol/[name]   Potassium 4.1 3.5 - 5.1 mmol/[name]   Chloride 102 101 - 111 mmol/[name]   CO2 26 22 - 32 mmol/[name]   Glucose, Bld 233 (H) 65 - 99 mg/dL   BUN 39 (H) 6 - 20 mg/dL   Creatinine, Ser 2.37 (H) 0.61 - 1.24 mg/dL   Calcium 8.4 ([name]) 8.9 - 10.3 mg/dL   Total Protein 6.5 6.5 - 8.1 g/dL   Albumin 2.5 ([name]) 3.5 - 5.0 g/dL   AST 37 15 - 41 U/[name]   ALT 16 ([name]) 17 - 63 U/[name]   Alkaline Phosphatase 46 38 - 126 U/[name]   Total Bilirubin 1.1 0.3 - 1.2 mg/dL   GFR calc non Af Amer 25 ([name]) >60 mL/min   GFR calc Af Amer 29 ([name]) >60 mL/min    Comment: (NOTE) The eGFR has been calculated using the CKD EPI equation. This calculation has not been validated in all clinical situations. eGFR's persistently <60 mL/min signify possible Chronic Kidney Disease.    Anion gap 11 5 - 15  Magnesium     Status: Abnormal   Collection Time: 05/12/16  3:22 AM  Result Value Ref Range   Magnesium 1.6 ([name]) 1.7 - 2.4 mg/dL  Phosphorus     Status: None   Collection Time: 05/12/16  3:22 AM  Result Value Ref Range   Phosphorus 4.0 2.5 - 4.6 mg/dL  Troponin I     Status: Abnormal   Collection Time: 05/12/16  3:22 AM  Result  Value Ref Range   Troponin I 2.28 (HH) <0.031 ng/mL    Comment:        POSSIBLE MYOCARDIAL ISCHEMIA. SERIAL TESTING RECOMMENDED. CRITICAL RESULT CALLED TO, READ BACK BY AND VERIFIED WITH: Olga Coaster [phone] WILDERK   Glucose, capillary     Status: Abnormal   Collection Time: 05/12/16  4:08 AM  Result Value Ref Range   Glucose-Capillary 212 (H) 65 - 99 mg/dL   Comment 1 Notify RN   Urine rapid drug screen (hosp performed)     Status: Abnormal   Collection Time: 05/12/16  5:14 AM  Result Value Ref Range   Opiates NONE DETECTED NONE DETECTED   Cocaine POSITIVE (A) NONE DETECTED   Benzodiazepines NONE DETECTED NONE DETECTED   Amphetamines NONE DETECTED NONE DETECTED   Tetrahydrocannabinol POSITIVE (A) NONE DETECTED   Barbiturates NONE DETECTED NONE DETECTED    Comment:        DRUG SCREEN FOR MEDICAL PURPOSES ONLY.  IF CONFIRMATION IS NEEDED FOR ANY PURPOSE, NOTIFY LAB WITHIN 5 DAYS.        LOWEST DETECTABLE LIMITS FOR URINE DRUG SCREEN Drug Class       Cutoff (ng/mL) Amphetamine      1000 Barbiturate      200 Benzodiazepine   542 Tricyclics       706 Opiates          300 Cocaine          300 THC              50   Glucose, capillary     Status: Abnormal   Collection Time: 05/12/16  8:37 AM  Result Value Ref Range   Glucose-Capillary 121 (H) 65 - 99 mg/dL    Dg Chest 2 View  05/11/2016  CLINICAL DATA:  Left anterior chest pain. EXAM: CHEST  2 VIEW COMPARISON:  11/28/2015 FINDINGS: Right upper mediastinal prominence. Atherosclerotic aortic arch. Mild to moderate enlargement of the cardiopericardial silhouette with indistinct pulmonary vasculature. No airspace edema. Minimal blunting of the right costophrenic angle, as before. Faint interstitial accentuation especially in the lower lobes. IMPRESSION: 1. Mild cardiomegaly with pulmonary venous hypertension and likely interstitial edema. 2. Trace right pleural effusion. 3. Right upper mediastinal prominence is chronic and  probably vascular ; this could be further confirmed with CT if clinically warranted. Electronically Signed   By: Van Clines M.D.   On: 05/11/2016 17:32               Blood pressure 120/62, pulse 38, temperature 97.8 F (36.6 C), temperature source Oral, resp. rate 20, height 6' (1.829 m), weight 85 kg (187 lb 6.3 oz), SpO2 98 %.  Physical exam:   General--sleeping ENT-- nonicteric Neck-- supple with no lymphadenopathy Heart-- irregular Lungs-- clear Abdomen-- soft and nontender    Assessment: 1. G.I. bleed. Patient has had extensive workup including recent EGD's, colonoscopy and small bowel capsule ENDOSCOPY. These have shown small nonbleeding AVMs in the stomach and a larger AVM in the distal small bowel. Unfortunately he continues to bleed whenever on anticoagulation. 2. Atrial fibrillation. Has had a history of prior stroke and will likely need long-term anticoagulation 3. Chest pain. Myocardial infarction is being ruled out at this time. This was likely due to anemia and exertional only. Hopefully he has not had a myocardial infarction. 4. Hypertension/type II diabetes/elevated cholesterol/CKD/CAD with history of CHF  Plan: 1. Will go ahead and start patient on clear liquids at this time. 2. Would transfused to keep hemoglobin over 8. 3. When cleared by cardiology, will likely need EGD and would go ahead and ablate the AVMs in the stomach. Trying to get rid of the larger AVM in the distal small bowel be much more invasive with much higher complication. We will follow with you.   [name],[name] 05/12/2016, 9:44 AM   This note was created using voice recognition software and minor errors may Have occurred unintentionally. Pager: [phone] If no answer or after hours call [phone]

## 2016-05-12 NOTE — Progress Notes (Signed)
CRITICAL VALUE ALERT  Critical value received:  Troponin 2.28  Date of notification:  05/12/16  Time of notification:  0419  Critical value read back: YES  Nurse who received alert:  Ihor Dow, RN  MD notified (1st page):  Schorr, NP  Time of first page:  [phone]  MD notified (2nd page):  Time of second page:  Responding MD:    Time MD responded:

## 2016-05-13 ENCOUNTER — Inpatient Hospital Stay (HOSPITAL_COMMUNITY): Payer: Medicare Other | Admitting: Certified Registered Nurse Anesthetist

## 2016-05-13 ENCOUNTER — Encounter (HOSPITAL_COMMUNITY): Payer: Self-pay | Admitting: *Deleted

## 2016-05-13 ENCOUNTER — Encounter (HOSPITAL_COMMUNITY)
Admission: EM | Discharge status: Against Medical Advice | Payer: Self-pay | Source: Home / Self Care | Attending: Internal Medicine

## 2016-05-13 DIAGNOSIS — I5023 Acute on chronic systolic (congestive) heart failure: Secondary | ICD-10-CM

## 2016-05-13 HISTORY — PX: ESOPHAGOGASTRODUODENOSCOPY: SHX5428

## 2016-05-13 HISTORY — PX: HOT HEMOSTASIS: SHX5433

## 2016-05-13 LAB — CBC
HCT: 30.6 % — ABNORMAL LOW (ref 39.0–52.0)
HCT: 31.8 % — ABNORMAL LOW (ref 39.0–52.0)
HEMATOCRIT: 29.3 % — AB (ref 39.0–52.0)
HEMOGLOBIN: 8.9 g/dL — AB (ref 13.0–17.0)
HEMOGLOBIN: 9.1 g/dL — AB (ref 13.0–17.0)
Hemoglobin: 9.7 g/dL — ABNORMAL LOW (ref 13.0–17.0)
MCH: 25.6 pg — AB (ref 26.0–34.0)
MCH: 25.3 pg — AB (ref 26.0–34.0)
MCH: 26.6 pg (ref 26.0–34.0)
MCHC: 30.4 g/dL (ref 30.0–36.0)
MCHC: 29.7 g/dL — AB (ref 30.0–36.0)
MCHC: 30.5 g/dL (ref 30.0–36.0)
MCV: 84.4 fL (ref 78.0–100.0)
MCV: 85 fL (ref 78.0–100.0)
MCV: 87.1 fL (ref 78.0–100.0)
PLATELETS: 224 10*3/uL (ref 150–400)
Platelets: 205 10*3/uL (ref 150–400)
Platelets: 212 10*3/uL (ref 150–400)
RBC: 3.47 MIL/uL — ABNORMAL LOW (ref 4.22–5.81)
RBC: 3.6 MIL/uL — ABNORMAL LOW (ref 4.22–5.81)
RBC: 3.65 MIL/uL — ABNORMAL LOW (ref 4.22–5.81)
RDW: 23.1 % — AB (ref 11.5–15.5)
RDW: 23.1 % — ABNORMAL HIGH (ref 11.5–15.5)
RDW: 23.6 % — AB (ref 11.5–15.5)
WBC: 4.6 10*3/uL (ref 4.0–10.5)
WBC: 5.1 10*3/uL (ref 4.0–10.5)
WBC: 5.8 10*3/uL (ref 4.0–10.5)

## 2016-05-13 LAB — GLUCOSE, CAPILLARY
Glucose-Capillary: 102 mg/dL — ABNORMAL HIGH (ref 65–99)
Glucose-Capillary: 137 mg/dL — ABNORMAL HIGH (ref 65–99)
Glucose-Capillary: 188 mg/dL — ABNORMAL HIGH (ref 65–99)
Glucose-Capillary: 321 mg/dL — ABNORMAL HIGH (ref 65–99)

## 2016-05-13 LAB — HEMOGLOBIN A1C
HEMOGLOBIN A1C: 7.8 % — AB (ref 4.8–5.6)
MEAN PLASMA GLUCOSE: 177 mg/dL

## 2016-05-13 SURGERY — EGD (ESOPHAGOGASTRODUODENOSCOPY)
Anesthesia: Monitor Anesthesia Care

## 2016-05-13 MED ORDER — SODIUM CHLORIDE 0.9 % IV SOLN: INTRAVENOUS | Status: DC

## 2016-05-13 MED ORDER — LIDOCAINE HCL (CARDIAC) 20 MG/ML IV SOLN
INTRAVENOUS | Status: DC | PRN
  Administered 2016-05-13: 40 mg via INTRATRACHEAL

## 2016-05-13 MED ORDER — LACTATED RINGERS IV SOLN
INTRAVENOUS | Status: DC
  Administered 2016-05-13: 1000 mL via INTRAVENOUS

## 2016-05-13 MED ORDER — PROPOFOL 500 MG/50ML IV EMUL
INTRAVENOUS | Status: DC | PRN
  Administered 2016-05-13: 100 ug/kg/min via INTRAVENOUS

## 2016-05-13 MED ORDER — BUTAMBEN-TETRACAINE-BENZOCAINE 2-2-14 % EX AERO
INHALATION_SPRAY | CUTANEOUS | Status: DC | PRN
  Administered 2016-05-13: 2 via TOPICAL

## 2016-05-13 MED ORDER — LACTATED RINGERS IV SOLN
INTRAVENOUS | Status: DC | PRN
  Administered 2016-05-13: 12:00:00 via INTRAVENOUS

## 2016-05-13 MED ORDER — PROPOFOL 10 MG/ML IV BOLUS
INTRAVENOUS | Status: DC | PRN
  Administered 2016-05-13: 10 mg via INTRAVENOUS
  Administered 2016-05-13: 20 mg via INTRAVENOUS

## 2016-05-13 MED ORDER — PHENYLEPHRINE HCL 10 MG/ML IJ SOLN
INTRAMUSCULAR | Status: DC | PRN
  Administered 2016-05-13: 40 ug via INTRAVENOUS
  Administered 2016-05-13: 80 ug via INTRAVENOUS
  Administered 2016-05-13 (×2): 40 ug via INTRAVENOUS

## 2016-05-13 NOTE — Progress Notes (Signed)
Inpatient Diabetes Program Recommendations  AACE/ADA: New Consensus Statement on Inpatient Glycemic Control (2015)  Target Ranges:  Prepandial:   less than 140 mg/dL      Peak postprandial:   less than 180 mg/dL (1-2 hours)      Critically ill patients:  140 - 180 mg/dL  Results for [name], [name] (MRN [id number]) as of 05/13/2016 09:17  Ref. Range 05/12/2016 16:53 05/12/2016 20:27 05/12/2016 23:14 05/13/2016 03:19 05/13/2016 08:07  Glucose-Capillary Latest Ref Range: 65-99 mg/dL 312 (H) 335 (H) 194 (H) 102 (H) 137 (H)  Results for [name], [name] (MRN [id number]) as of 05/13/2016 09:17  Ref. Range 05/11/2016 15:56  Hemoglobin A1C Latest Ref Range: 4.8-5.6 % 7.8 (H)   Review of Glycemic Control  Diabetes history: DM Type 2 Outpatient Diabetes medications: Regular insulin 5 units ac breakfast + 5 units ac supper Current orders for Inpatient glycemic control: Novolog correction 0-9 units q 4 hrs.  Inpatient Diabetes Program Recommendations:  Please consider starting basal of Lantus insulin 13 units (0.15 units/kg).  Thank you, Nani Gasser. Madilynn Montante, RN, MSN, CDE Inpatient Glycemic Control Team Team Pager [phone] (8am-5pm) 05/13/2016 9:24 AM

## 2016-05-13 NOTE — Transfer of Care (Signed)
Immediate Anesthesia Transfer of Care Note  Patient: [name]  Procedure(s) Performed: Procedure(s): ESOPHAGOGASTRODUODENOSCOPY (EGD) (N/A) HOT HEMOSTASIS (ARGON PLASMA COAGULATION/BICAP) (N/A)  Patient Location: Endoscopy Unit  Anesthesia Type:MAC  Level of Consciousness: awake, alert , oriented and patient cooperative  Airway & Oxygen Therapy: Patient Spontanous Breathing and Patient connected to nasal cannula oxygen  Post-op Assessment: Report given to RN and Post -op Vital signs reviewed and stable  Post vital signs: Reviewed and stable  Last Vitals:  Filed Vitals:   05/13/16 1056 05/13/16 1241  BP: 128/70   Pulse:  95  Temp: 37 C   Resp: 15 15    Last Pain:  Filed Vitals:   05/13/16 1242  PainSc: Asleep      Patients Stated Pain Goal: 1 (23/30/07 6226)  Complications: No apparent anesthesia complications

## 2016-05-13 NOTE — Anesthesia Preprocedure Evaluation (Signed)
Anesthesia Evaluation  Patient identified by MRN, date of birth, ID band Patient awake    Reviewed: Allergy & Precautions, NPO status , Patient's Chart, lab work & pertinent test results  Airway Mallampati: II  TM Distance: >3 FB     Dental  (+) Edentulous Upper, Edentulous Lower   Pulmonary Current Smoker,    breath sounds clear to auscultation       Cardiovascular hypertension,  Rhythm:Regular Rate:Normal     Neuro/Psych    GI/Hepatic   Endo/Other  diabetes  Renal/GU      Musculoskeletal   Abdominal   Peds  Hematology   Anesthesia Other Findings   Reproductive/Obstetrics                             Anesthesia Physical Anesthesia Plan  ASA: III  Anesthesia Plan: MAC   Post-op Pain Management:    Induction: Intravenous  Airway Management Planned: Natural Airway and Nasal Cannula  Additional Equipment:   Intra-op Plan:   Post-operative Plan:   Informed Consent: I have reviewed the patients History and Physical, chart, labs and discussed the procedure including the risks, benefits and alternatives for the proposed anesthesia with the patient or authorized representative who has indicated his/her understanding and acceptance.     Plan Discussed with: CRNA and Anesthesiologist  Anesthesia Plan Comments:         Anesthesia Quick Evaluation

## 2016-05-13 NOTE — Progress Notes (Signed)
Patient Name: [name] Date of Encounter: 05/13/2016  Principal Problem:   Chest pain with high risk of acute coronary syndrome Active Problems:   DM (diabetes mellitus), type 2 with renal complications ([place])   Hyperlipidemia associated with type 2 diabetes mellitus ([place])   Essential hypertension   CAD- last PCI 2011   Chest pain, unspecified   Edema   Chronic atrial fibrillation (HCC)   Tobacco abuse   Acute on chronic diastolic congestive heart failure (HCC)   CKD (chronic kidney disease), stage III   CVA (cerebral infarction)-Augb 2015   Anemia   GIB (gastrointestinal bleeding)   GI bleed-recent SB study-AVMs   Melena   Demand ischemia (HCC)   Chronic anticoagulation-Eliquis   Elevated troponin   Acute blood loss anemia   Acute on chronic kidney failure (HCC)   Symptomatic anemia   Length of Stay: 2  SUBJECTIVE  Going back to endoscopy this morning. Denies CV complaints. Echo shows deterioration in LVEF (25-30%) with basal inferolateral akinesis.  CURRENT MEDS . atorvastatin  20 mg Oral QHS  . cilostazol  50 mg Oral Q12H  . finasteride  5 mg Oral Daily  . gabapentin  300 mg Oral BID  . insulin aspart  0-9 Units Subcutaneous Q4H  . metoprolol  25 mg Oral BID  . nicotine  14 mg Transdermal Daily  . [START ON 05/15/2016] pantoprazole  40 mg Oral Daily  . sodium chloride flush  3 mL Intravenous Q12H  . tamsulosin  0.4 mg Oral Daily    OBJECTIVE   Intake/Output Summary (Last 24 hours) at 05/13/16 1041 Last data filed at 05/13/16 0954  Gross per 24 hour  Intake    556 ml  Output    751 ml  Net   -195 ml   Filed Weights   05/11/16 1557 05/11/16 2035  Weight: 82.555 kg (182 lb) 85 kg (187 lb 6.3 oz)    PHYSICAL EXAM Filed Vitals:   05/12/16 2032 05/12/16 2311 05/13/16 0304 05/13/16 0809  BP: 118/70 105/63 112/71 140/73  Pulse: 89 79 68 98  Temp:  97.4 F (36.3 C) 97.4 F (36.3 C) 97.6 F (36.4 C)  TempSrc:  Oral Oral Oral  Resp:  '16 12 23   '$ Height:      Weight:      SpO2:  96% 100% 98%   General: Alert, oriented x3, no distress Head: no evidence of trauma, PERRL, EOMI, no exophtalmos or lid lag, no myxedema, no xanthelasma; normal ears, nose and oropharynx Neck: normal jugular venous pulsations and no hepatojugular reflux; brisk carotid pulses without delay and no carotid bruits Chest: clear to auscultation, no signs of consolidation by percussion or palpation, normal fremitus, symmetrical and full respiratory excursions Cardiovascular: normal position and quality of the apical impulse, irregular rhythm, normal first and widely split second heart sounds, no rubs or gallops, no murmur Abdomen: no tenderness or distention, no masses by palpation, no abnormal pulsatility or arterial bruits, normal bowel sounds, no hepatosplenomegaly Extremities: no clubbing, cyanosis or edema; 2+ radial, ulnar and brachial pulses bilaterally; 2+ right femoral, posterior tibial and dorsalis pedis pulses; 2+ left femoral, posterior tibial and dorsalis pedis pulses; no subclavian or femoral bruits Neurological: grossly nonfocal  LABS  CBC  Recent Labs  05/11/16 1556  05/12/16 1810 05/13/16 0215  WBC 4.9  < > 5.0 4.6  NEUTROABS 3.9  --   --   --   HGB 6.9*  < > 9.1* 8.9*  HCT 22.9*  < >  29.1* 29.3*  MCV 88.4  < > 84.6 84.4  PLT 232  < > 222 205  < > = values in this interval not displayed. Basic Metabolic Panel  Recent Labs  05/11/16 1556 05/12/16 0322  NA 136 139  K 3.9 4.1  CL 101 102  CO2 26 26  GLUCOSE 325* 233*  BUN 36* 39*  CREATININE 2.43* 2.37*  CALCIUM 8.5* 8.4*  MG  --  1.6*  PHOS  --  4.0   Liver Function Tests  Recent Labs  05/12/16 0322  AST 37  ALT 16*  ALKPHOS 46  BILITOT 1.1  PROT 6.5  ALBUMIN 2.5*   No results for input(s): LIPASE, AMYLASE in the last 72 hours. Cardiac Enzymes  Recent Labs  05/12/16 0322 05/12/16 0956  TROPONINI 2.28* 2.66*   BNP Invalid input(s): POCBNP D-Dimer No  results for input(s): DDIMER in the last 72 hours. Hemoglobin A1C  Recent Labs  05/11/16 1556  HGBA1C 7.8*   Fasting Lipid Panel No results for input(s): CHOL, HDL, LDLCALC, TRIG, CHOLHDL, LDLDIRECT in the last 72 hours. Thyroid Function Tests  Recent Labs  05/12/16 0320  TSH 0.750    Radiology Studies Imaging results have been reviewed and Dg Chest 2 View  05/11/2016  CLINICAL DATA:  Left anterior chest pain. EXAM: CHEST  2 VIEW COMPARISON:  11/28/2015 FINDINGS: Right upper mediastinal prominence. Atherosclerotic aortic arch. Mild to moderate enlargement of the cardiopericardial silhouette with indistinct pulmonary vasculature. No airspace edema. Minimal blunting of the right costophrenic angle, as before. Faint interstitial accentuation especially in the lower lobes. IMPRESSION: 1. Mild cardiomegaly with pulmonary venous hypertension and likely interstitial edema. 2. Trace right pleural effusion. 3. Right upper mediastinal prominence is chronic and probably vascular ; this could be further confirmed with CT if clinically warranted. Electronically Signed   By: Van Clines M.D.   On: 05/11/2016 17:32    TELE Afib, rate controlled, frequent PVCs    ASSESSMENT AND PLAN  1. CAD s/p LCX STEMI 2011; acute Demand ischemia with small NSTEMI on this admission Very challenging situation due to renal insufficiency and active bleeding. No new area of regional wall motion abnormality, but worsening diffuse hypokinesis, maybe suggests multivessel CAD with widespread ischemia due to GI bleeding and anemia. Since transfusion he is asymptomatic. No plan for invasive evaluation or treatment on this admission. Consider outpatient nuclear study and only perform cath if there is a very large area of viable myocardium with threatening ischemia.  2. Persistent atrial fibrillation  Rate controlled. Very high stroke risk: This patients CHA2DS2-VASc Score and unadjusted Ischemic Stroke Rate (% per  year) is equal to 10.8 % stroke rate/year from a score of 8 Above score calculated as 1 point each if present [CHF, HTN, DM, hx CAD and MI, Age > 40, history of Stroke]. Discuss with Dr. Burt Knack whether he would be a good Watchman candidate (issues of advanced age, alcohol abuse and still need for temporary warfarin anticoagulation).  I would not restart long term anticoagulation otherwise.  3. Chronic systolic and diastolic heart failure EF was 40-45% in 2015, dropped to 25-30%, same inferior-inferolateral hypokinesis, moderate MR unchanged. Appears clinically euvolemic today, but CXR 05/15 and increased PA pressure on echo suggests some degree of hypervolemia. He is comfortable lying flat.  4. 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 Improved Hgb after transfusion appears to be staying unchanged. Going back for EGD today to treat accessible AVMs in  stomach and duodenum.  5. Acute on CKD stage 3-4 Probably only slightly worse than baseline, but another problem that makes angiography very unappealing. No BMET today. Will order.  7. Alcohol abuse He buys 3-4 half gallons of liquour every month. He enjoys drinking and does not plan to stop. Watch for symptoms of withdrawal.  8. Tobacco abuse He understands that this may be deleterious to his health, but since he has seen no convincing adverse events (in his opinion) and since "he is so old" , he has no intention of quitting.  9. Severe PAD 2014 - right ABI is 0.39, left 0.75. Segmental pressures suggest significant bilateral iliofemoral occlusive disease, left infrapopliteal and multisegmental right occlusive disease. Triphasic waveforms at the left femoral level, monophasic throughout their more distal left lower extremity and throughout the right lower extremity. No significant carotid disease 2015 [place].   Sanda Klein, MD, [facility] [phone] office [phone]  pager 05/13/2016 10:41 AM

## 2016-05-13 NOTE — Progress Notes (Signed)
Lethea Killings, NP notified of multiple long runs of PVCs.

## 2016-05-13 NOTE — Progress Notes (Signed)
[facility] PROGRESS NOTE  [name] YHC:[id number] DOB: 16-Dec-1936 DOA: 05/11/2016  PCP: Beacher May, MD  Brief HPI: 80 year old African-American male with a past medical history of coronary artery disease, diastolic CHF, hypertension, diabetes, history of atrial fibrillation on anticoagulation with a history of GI bleed, presented with chest pain, along with a history of black colored stool. Patient had elevated troponins. He was seen by cardiology. He was also seen by gastroenterology. His anticoagulation was discontinued.  Past medical history:  Past Medical History  Diagnosis Date  . Hyperlipidemia   . HTN (hypertension)     echo 2/11: EF 50-55%, diast dyfxn, severe LVH, inf HK, LAE  . CAD (coronary artery disease)     a.  NSTEMI treated with PCI Feb 2011 with a DES.([facility]);   b. cath 2/11: D1 stents x 2 ok, AV groove CFX occluded with dist AV CFX filled by L-L collats, RCA occluded, OM2 90-95% (treated with PCI)  . MI (myocardial infarction) ([place])   . PVD (peripheral vascular disease) ([place])   . DM (diabetes mellitus) ([place])   . GERD (gastroesophageal reflux disease)   . Bipolar disorder ([place])   . Prostate cancer [name])     status post radiation therapy in 2003  . Pulmonary edema   . Atrial fibrillation ([place])   . Respiratory difficulty 06/24/2014    intubated   . Hypertensive emergency 06/24/2014  . CKD (chronic kidney disease), stage III   . Anemia 11/28/2015  . Shortness of breath dyspnea   . Stroke [facility]) 07/2014    Consultants: Cardiology. Gastroenterology  Procedures:  Upper endoscopy is pending  Transthoracic echocardiogram Study Conclusions - Left ventricle: The cavity size was mildly dilated. There was moderate concentric hypertrophy. Systolic function was severely reduced. The estimated ejection fraction was in the range of 25% to 30%. Diffuse hypokinesis with akinesis of the basal inferolateral wall. The study is not technically sufficient  to  allow evaluation of LV diastolic function. - Aortic valve: Transvalvular velocity was within the normal range. There was no stenosis. There was mild regurgitation. - Mitral valve: Mobility of the anterior and posterior leaflet was restricted. There was mild regurgitation. - Left atrium: The atrium was severely dilated. - Right ventricle: The cavity size was normal. Wall thickness was normal. Systolic function was moderately reduced. - Right atrium: The atrium was mildly dilated. - Atrial septum: No defect or patent foramen ovale was identified by color flow Doppler. - Tricuspid valve: There was mild regurgitation. - Pulmonary arteries: Systolic pressure was moderately increased. PA peak pressure: 51 mm Hg (S). - Inferior vena cava: The vessel was dilated. The respirophasic diameter changes were blunted (< 50%), consistent with elevated central venous pressure. Impressions: - Unable to view echo from 07/2014, but LVEF appears to be reduced since that time.   Antibiotics: None  Subjective: Patient feels well this morning. Denies any chest pain, shortness of breath. No nausea, vomiting. Had dark stool yesterday.  Objective:  Vital Signs  Filed Vitals:   05/12/16 2311 05/13/16 0304 05/13/16 0809 05/13/16 1056  BP: 105/63 112/71 140/73 128/70  Pulse: 79 68 98   Temp: 97.4 F (36.3 C) 97.4 F (36.3 C) 97.6 F (36.4 C) 98.6 F (37 C)  TempSrc: Oral Oral Oral Oral  Resp: '16 12 23 15  '$ Height:      Weight:      SpO2: 96% 100% 98% 93%    Intake/Output Summary (Last 24 hours) at 05/13/16 1210 Last data filed at  05/13/16 0954  Gross per 24 hour  Intake    556 ml  Output    751 ml  Net   -195 ml   Filed Weights   05/11/16 1557 05/11/16 2035  Weight: 82.555 kg (182 lb) 85 kg (187 lb 6.3 oz)    General appearance: alert, cooperative, appears stated age and no distress Resp: clear to auscultation bilaterally Cardio: regular rate and rhythm, S1, S2 normal, no murmur, click,  rub or gallop GI: soft, non-tender; bowel sounds normal; no masses,  no organomegaly Extremities: extremities normal, atraumatic, no cyanosis or edema Neurologic: Patient is awake, alert. Oriented 3. No cranial nerve deficits. Motor strength equal bilateral upper and lower extremities.  Lab Results:  Data Reviewed: I have personally reviewed following labs and imaging studies  CBC:  Recent Labs Lab 05/11/16 1556 05/12/16 0322 05/12/16 0956 05/12/16 1810 05/13/16 0215 05/13/16 1007  WBC 4.9 5.5 5.5 5.0 4.6 5.1  NEUTROABS 3.9  --   --   --   --   --   HGB 6.9* 8.2* 8.4* 9.1* 8.9* 9.1*  HCT 22.9* 26.8* 26.6* 29.1* 29.3* 30.6*  MCV 88.4 83.2 83.9 84.6 84.4 85.0  PLT 232 199 200 222 205 741   Basic Metabolic Panel:  Recent Labs Lab 05/11/16 1556 05/12/16 0322  NA 136 139  K 3.9 4.1  CL 101 102  CO2 26 26  GLUCOSE 325* 233*  BUN 36* 39*  CREATININE 2.43* 2.37*  CALCIUM 8.5* 8.4*  MG  --  1.6*  PHOS  --  4.0   GFR: Estimated Creatinine Clearance: 28.6 mL/min (by C-G formula based on Cr of 2.37).  Liver Function Tests:  Recent Labs Lab 05/12/16 0322  AST 37  ALT 16*  ALKPHOS 46  BILITOT 1.1  PROT 6.5  ALBUMIN 2.5*   Coagulation Profile:  Recent Labs Lab 05/11/16 1823  INR 1.73*   Cardiac Enzymes:  Recent Labs Lab 05/12/16 0322 05/12/16 0956  TROPONINI 2.28* 2.66*   HbA1C:  Recent Labs  05/11/16 1556  HGBA1C 7.8*   CBG:  Recent Labs Lab 05/12/16 1653 05/12/16 2027 05/12/16 2314 05/13/16 0319 05/13/16 0807  GLUCAP 312* 335* 194* 102* 137*   Thyroid Function Tests:  Recent Labs  05/12/16 0320  TSH 0.750   Urine analysis:    Component Value Date/Time   COLORURINE YELLOW 08/25/2014 [place] 08/25/2014 1602   LABSPEC 1.008 08/25/2014 1602   PHURINE 6.5 08/25/2014 1602   GLUCOSEU NEGATIVE 08/25/2014 1602   HGBUR TRACE* 08/25/2014 1602   BILIRUBINUR NEGATIVE 08/25/2014 1602   KETONESUR NEGATIVE 08/25/2014  1602   PROTEINUR NEGATIVE 08/25/2014 1602   UROBILINOGEN 1.0 08/25/2014 1602   NITRITE NEGATIVE 08/25/2014 1602   LEUKOCYTESUR NEGATIVE 08/25/2014 1602    Recent Results (from the past 240 hour(s))  MRSA PCR Screening     Status: None   Collection Time: 05/11/16  9:05 PM  Result Value Ref Range Status   MRSA by PCR NEGATIVE NEGATIVE Final    Comment:        The GeneXpert MRSA Assay (FDA approved for NASAL specimens only), is one component of a comprehensive MRSA colonization surveillance program. It is not intended to diagnose MRSA infection nor to guide or monitor treatment for MRSA infections.       Radiology Studies: Dg Chest 2 View  05/11/2016  CLINICAL DATA:  Left anterior chest pain. EXAM: CHEST  2 VIEW COMPARISON:  11/28/2015 FINDINGS: Right upper mediastinal prominence. Atherosclerotic  aortic arch. Mild to moderate enlargement of the cardiopericardial silhouette with indistinct pulmonary vasculature. No airspace edema. Minimal blunting of the right costophrenic angle, as before. Faint interstitial accentuation especially in the lower lobes. IMPRESSION: 1. Mild cardiomegaly with pulmonary venous hypertension and likely interstitial edema. 2. Trace right pleural effusion. 3. Right upper mediastinal prominence is chronic and probably vascular ; this could be further confirmed with CT if clinically warranted. Electronically Signed   By: Van Clines M.D.   On: 05/11/2016 17:32     Medications:  Scheduled: . [MAR Hold] atorvastatin  20 mg Oral QHS  . [MAR Hold] cilostazol  50 mg Oral Q12H  . [MAR Hold] finasteride  5 mg Oral Daily  . [MAR Hold] gabapentin  300 mg Oral BID  . [MAR Hold] insulin aspart  0-9 Units Subcutaneous Q4H  . [MAR Hold] metoprolol  25 mg Oral BID  . [MAR Hold] nicotine  14 mg Transdermal Daily  . [MAR Hold] pantoprazole  40 mg Oral Daily  . [MAR Hold] sodium chloride flush  3 mL Intravenous Q12H  . [MAR Hold] tamsulosin  0.4 mg Oral Daily    Continuous: . sodium chloride    . lactated ringers 1,000 mL (05/13/16 1104)  . pantoprozole (PROTONIX) infusion 8 mg/hr (05/13/16 0000)   PRN:[MAR Hold] acetaminophen **OR** [MAR Hold] acetaminophen, butamben-tetracaine-benzocaine, [MAR Hold] guaiFENesin, [MAR Hold] HYDROcodone-acetaminophen, [MAR Hold] hydrOXYzine, [MAR Hold] ondansetron **OR** [MAR Hold] ondansetron (ZOFRAN) IV, [MAR Hold] simethicone  Assessment/Plan:  Principal Problem:   Chest pain with high risk of acute coronary syndrome Active Problems:   DM (diabetes mellitus), type 2 with renal complications ([place])   Hyperlipidemia associated with type 2 diabetes mellitus ([place])   Essential hypertension   CAD- last PCI 2011   Chest pain, unspecified   Edema   Chronic atrial fibrillation (HCC)   Tobacco abuse   Acute on chronic diastolic congestive heart failure (HCC)   CKD (chronic kidney disease), stage III   CVA (cerebral infarction)-Augb 2015   Anemia   GIB (gastrointestinal bleeding)   GI bleed-recent SB study-AVMs   Melena   Demand ischemia (HCC)   Chronic anticoagulation-Eliquis   Elevated troponin   Acute blood loss anemia   Acute on chronic kidney failure (HCC)   Symptomatic anemia    Demand ischemia in the setting of known coronary artery disease Troponin was noted to be significantly elevated. Patient was seen by cardiology. Presentation was thought to be secondary to demand ischemia. No evidence for any workup at this time. No anticoagulation due to GI bleed. Echocardiogram as above.   GI bleed with melena Patient has a history of GI bleeding. He has been seen by gastroenterology in the past. Recent small bowel study revealed AVMs. GI is following. Plan is for upper endoscopy today. Continue PPI.   Chronic atrial fibrillation Anticoagulation has been discontinued due to GI bleeding. Cardiology feels that his risks of adverse effect would be very high due to the GI bleed as well as alcoholism and did  not recommend restarting the anticoagulation at this time. His CHADS2Vasc score is 8.  Chronic systolic and diastolic CHF Ejection fraction is further reduced to 25-30% on echocardiogram from this admission compared to the one from 2015. Hypokinesis of the inferior and inferolateral walls noted. Moderate MR was seen. Patient appears to be euvolemic. Continue to monitor clinically. No ACE inhibitor due to renal failure.  Acute blood loss anemia Secondary to GI bleed. Patient was transfused 2 units. Hemoglobin is stable.  Continue to monitor.  Essential hypertension Monitor blood pressures closely.  Tobacco abuse Continue nicotine patch.   DM (diabetes mellitus), type 2 with renal complications Continue sliding scale coverage. HbA1c is 7.8.   History of CVA Aug 2015 Stable.  Hyperlipidemia associated with type 2 diabetes mellitus Stable. Continue Lipitor   Acute on chronic kidney failure, stage 3-4 General monitor renal function closely. Probably close to baseline at this time. Monitor urine output.   History of alcohol abuse Watch for symptoms of withdrawal. Patient has been counseled, however, apparently he enjoys drinking alcohol. No plan for discontinuing. This also increases the risk for adverse effect with anticoagulation.  History of peripheral artery disease Stable. Medical risk reduction.  DVT Prophylaxis: SCDs    Code Status: Full code  Family Communication: Discussed with the patient  Disposition Plan: Await EGD. Monitor hemoglobin closely. Hopefully transfer to telemetry soon. PT and OT evaluation    LOS: 2 days   [facility] Pager [phone] 05/13/2016, 12:10 PM  If 7PM-7AM, please contact night-coverage at [email or web address], password [email or web address]

## 2016-05-13 NOTE — Progress Notes (Signed)
[facility] PROGRESS NOTE Subjective Pt without chest pain or gross bleeding.  Objective: Vital signs in last 24 hours: Temp:  [97.2 F (36.2 C)-97.4 F (36.3 C)] 97.4 F (36.3 C) (05/17 0304) Pulse Rate:  [37-92] 68 (05/17 0304) Resp:  [12-21] 12 (05/17 0304) BP: (102-118)/(43-71) 112/71 mmHg (05/17 0304) SpO2:  [92 %-100 %] 100 % (05/17 0304) Last BM Date: 05/11/16  Intake/Output from previous day: 05/16 0701 - 05/17 0700 In: 553 [I.V.:553] Out: 250 [Urine:250] Intake/Output this shift:    PE: General--no acute distress   Lab Results:  Recent Labs  05/11/16 1556 05/12/16 0322 05/12/16 0956 05/12/16 1810 05/13/16 0215  WBC 4.9 5.5 5.5 5.0 4.6  HGB 6.9* 8.2* 8.4* 9.1* 8.9*  HCT 22.9* 26.8* 26.6* 29.1* 29.3*  PLT 232 199 200 222 205   BMET  Recent Labs  05/11/16 1556 05/12/16 0322  NA 136 139  K 3.9 4.1  CL 101 102  CO2 26 26  CREATININE 2.43* 2.37*   LFT  Recent Labs  05/12/16 0322  PROT 6.5  AST 37  ALT 16*  ALKPHOS 46  BILITOT 1.1   PT/INR  Recent Labs  05/11/16 1823  LABPROT 20.2*  INR 1.73*   PANCREAS No results for input(s): LIPASE in the last 72 hours.       Studies/Results: Dg Chest 2 View  05/11/2016  CLINICAL DATA:  Left anterior chest pain. EXAM: CHEST  2 VIEW COMPARISON:  11/28/2015 FINDINGS: Right upper mediastinal prominence. Atherosclerotic aortic arch. Mild to moderate enlargement of the cardiopericardial silhouette with indistinct pulmonary vasculature. No airspace edema. Minimal blunting of the right costophrenic angle, as before. Faint interstitial accentuation especially in the lower lobes. IMPRESSION: 1. Mild cardiomegaly with pulmonary venous hypertension and likely interstitial edema. 2. Trace right pleural effusion. 3. Right upper mediastinal prominence is chronic and probably vascular ; this could be further confirmed with CT if clinically warranted. Electronically Signed   By: Van Clines M.D.    On: 05/11/2016 17:32    Medications: I have reviewed the patient's current medications.  Assessment/Plan: 1. Chronic GI Bleeding. Pt has documented AVMs in stomach, duodenum and distal/mid small bowel. None of these have been bleeding when studied but probably do while anticoagulated. 2. A Fib with history of stroke. Holding anticoagulation would place at risk of another stroke. 3. EtOH abuse. Has reasonable liver function.  Plan: Today EGD with Dr Watt Climes to ablate the AVMs in stomach and duodenum and see if that stops the chronic bleeding. AVM in SB much more difficult to deal with ? Surgery etc. I would definitely consider the Watchman procedure to allow stopping anticoagulation going forward. He would need to be anticoagulated for a while but could be managed with close OP follow up and frequent IV iron until anticoagulation could be stopped.  Khristine Verno JR,Ciaira Natividad L 05/13/2016, 8:08 AM  This note was created using voice recognition software. Minor errors may Have occurred unintentionally.  Pager: [phone] If no answer or after hours call [phone]

## 2016-05-13 NOTE — Progress Notes (Signed)
[name] 11:39 AM  Subjective: Patient seen and examined and hospital computer chart reviewed and case discussed with my partner Dr. Oletta Lamas and the patient has no new complaints  Objective: Vital signs stable afebrile no acute distress exam please see preassessment evaluation labs stable  Assessment: GI blood loss in patient on anticoagulation probably AVMs  Plan: Okay to proceed with endoscopy and possible enteroscopy with APC of lesions seen with anesthesia assistance  [facility]  Pager [phone] After 5PM or if no answer call [phone]

## 2016-05-13 NOTE — Anesthesia Postprocedure Evaluation (Signed)
Anesthesia Post Note  Patient: [name]  Procedure(s) Performed: Procedure(s) (LRB): ESOPHAGOGASTRODUODENOSCOPY (EGD) (N/A) HOT HEMOSTASIS (ARGON PLASMA COAGULATION/BICAP) (N/A)  Patient location during evaluation: Endoscopy Anesthesia Type: General Level of consciousness: awake, awake and alert and oriented Pain management: pain level controlled Vital Signs Assessment: post-procedure vital signs reviewed and stable Respiratory status: spontaneous breathing, nonlabored ventilation and respiratory function stable Cardiovascular status: blood pressure returned to baseline Anesthetic complications: no    Last Vitals:  Filed Vitals:   05/13/16 1340 05/13/16 1414  BP: 120/44 139/71  Pulse: 79 89  Temp:  36.7 C  Resp: 12 21    Last Pain:  Filed Vitals:   05/13/16 1415  PainSc: 0-No pain                 Eldwin Volkov COKER

## 2016-05-13 NOTE — Op Note (Signed)
[facility] Patient Name: [name] Procedure Date : 05/13/2016 MRN: [id number] Attending MD: Clarene Essex , MD Date of Birth: Jan 16, 1936 CSN: [id number] Age: 80 Admit Type: Inpatient Procedure:                Upper GI endoscopy Indications:              Iron deficiency anemia secondary to chronic blood                            loss Providers:                Clarene Essex, MDLisa Trudie Buckler, RN, Corliss Parish, Technician Referring MD:              Medicines:                Propofol total dose 109 mg IV Complications:            No immediate complications. Estimated Blood Loss:     Estimated blood loss: none. Procedure:                Pre-Anesthesia Assessment:                           - Prior to the procedure, a History and Physical                            was performed, and patient medications and                            allergies were reviewed. The patient's tolerance of                            previous anesthesia was also reviewed. The risks                            and benefits of the procedure and the sedation                            options and risks were discussed with the patient.                            All questions were answered, and informed consent                            was obtained. Prior Anticoagulants: The patient has                            taken Eliquis (apixaban), last dose was 2 days                            prior to procedure. ASA Grade Assessment: III - A  patient with severe systemic disease. After                            reviewing the risks and benefits, the patient was                            deemed in satisfactory condition to undergo the                            procedure.                           After obtaining informed consent, the endoscope was                            passed under direct vision. Throughout the   procedure, the patient's blood pressure, pulse, and                            oxygen saturations were monitored continuously. The                            EG-[id number] ([id number]) scope was introduced through the                            mouth, and advanced to the second part of duodenum.                            The EC-[id number] ([id number]) scope was introduced                            through the and advanced to theProximal to mid                            jejunum. The upper GI endoscopy was accomplished                            without difficulty. The patient tolerated the                            procedure well. Scope In: Scope Out: Findings:      The larynx was normal.      A small hiatal hernia was present.      The entire examined stomach was normal.      The duodenal bulb, first portion of the duodenum and second portion of       the duodenum were normal.      A single small angiodysplastic lesion with stigmata of recent bleeding       was found in the third portion of the duodenum. Coagulation for       hemostasis using argon beam at 0.5 liters/minute and 20 watts was       successful.      The examined jejunum was normal.      The fourth portion of the duodenum was normal.      The exam was otherwise without abnormality. Impression:               -  Normal larynx.                           - Small hiatal hernia.                           - Normal stomach.                           - Normal duodenal bulb, first portion of the                            duodenum and second portion of the duodenum.                           - A single recently bleeding angiodysplastic lesion                            in the duodenum. Treated with argon beam                            coagulation.                           - Normal examined jejunum.                           - Normal fourth portion of the duodenum.                           - The examination was otherwise normal.                            - No specimens collected. Moderate Sedation:      moderate sedation-none Recommendation:           - Patient has a contact number available for                            emergencies. The signs and symptoms of potential                            delayed complications were discussed with the                            patient. Return to normal activities tomorrow.                            Written discharge instructions were provided to the                            patient.                           - Observe patient in same day observation unit for  ongoing care. okay to resume blood thinners                            tomorrow and have him see either the renal doctors                            for periodic iron infusions and erythropoietin or                            hematology in an effort to keep hemoglobin in a                            safe range                           - Resume previous diet today.                           - Continue present medications.                           - Return to GI clinic PRN.                           - Telephone GI clinic if symptomatic PRN. Procedure Code(s):        --- Professional ---                           [phone], Esophagogastroduodenoscopy, flexible,                            transoral; with control of bleeding, any method Diagnosis Code(s):        --- Professional ---                           K44.9, Diaphragmatic hernia without obstruction or                            gangrene                           K31.811, Angiodysplasia of stomach and duodenum                            with bleeding                           D50.0, Iron deficiency anemia secondary to blood                            loss (chronic) CPT copyright 2016 American Medical Association. All rights reserved. The codes documented in this report are preliminary and upon coder review may  be revised to meet current  compliance requirements. Clarene Essex, MD Clarene Essex, MD 05/13/2016 12:51:16 PM This report has been signed electronically. Number of Addenda: 0

## 2016-05-14 ENCOUNTER — Encounter (HOSPITAL_COMMUNITY): Payer: Self-pay | Admitting: Gastroenterology

## 2016-05-14 DIAGNOSIS — F1010 Alcohol abuse, uncomplicated: Secondary | ICD-10-CM

## 2016-05-14 DIAGNOSIS — I5033 Acute on chronic diastolic (congestive) heart failure: Secondary | ICD-10-CM

## 2016-05-14 DIAGNOSIS — Z7901 Long term (current) use of anticoagulants: Secondary | ICD-10-CM

## 2016-05-14 LAB — GLUCOSE, CAPILLARY
GLUCOSE-CAPILLARY: 138 mg/dL — AB (ref 65–99)
GLUCOSE-CAPILLARY: 155 mg/dL — AB (ref 65–99)
GLUCOSE-CAPILLARY: 163 mg/dL — AB (ref 65–99)
GLUCOSE-CAPILLARY: 214 mg/dL — AB (ref 65–99)
GLUCOSE-CAPILLARY: 282 mg/dL — AB (ref 65–99)

## 2016-05-14 LAB — BASIC METABOLIC PANEL
Anion gap: 7 (ref 5–15)
BUN: 32 mg/dL — ABNORMAL HIGH (ref 6–20)
CHLORIDE: 102 mmol/L (ref 101–111)
CO2: 28 mmol/L (ref 22–32)
Calcium: 8.2 mg/dL — ABNORMAL LOW (ref 8.9–10.3)
Creatinine, Ser: 2.1 mg/dL — ABNORMAL HIGH (ref 0.61–1.24)
GFR calc non Af Amer: 29 mL/min — ABNORMAL LOW (ref >60)
GFR, EST AFRICAN AMERICAN: 33 mL/min — AB (ref >60)
Glucose, Bld: 168 mg/dL — ABNORMAL HIGH (ref 65–99)
POTASSIUM: 4.3 mmol/L (ref 3.5–5.1)
SODIUM: 137 mmol/L (ref 135–145)

## 2016-05-14 LAB — CBC
HEMATOCRIT: 28.1 % — AB (ref 39.0–52.0)
Hemoglobin: 8.5 g/dL — ABNORMAL LOW (ref 13.0–17.0)
MCH: 25.9 pg — ABNORMAL LOW (ref 26.0–34.0)
MCHC: 30.2 g/dL (ref 30.0–36.0)
MCV: 85.7 fL (ref 78.0–100.0)
PLATELETS: 208 10*3/uL (ref 150–400)
RBC: 3.28 MIL/uL — AB (ref 4.22–5.81)
RDW: 23.3 % — AB (ref 11.5–15.5)
WBC: 4.8 10*3/uL (ref 4.0–10.5)

## 2016-05-14 NOTE — Progress Notes (Signed)
[facility] PROGRESS NOTE Subjective Pt w/o gross bleeding. A small AVM in the duodenum was cauterized yesterday.  Objective: Vital signs in last 24 hours: Temp:  [97.3 F (36.3 C)-98.5 F (36.9 C)] 97.4 F (36.3 C) (05/18 1126) Pulse Rate:  [55-110] 85 (05/18 1126) Resp:  [12-25] 18 (05/18 1126) BP: (86-163)/(28-107) 149/107 mmHg (05/18 1126) SpO2:  [92 %-100 %] 100 % (05/18 1126) Weight:  [89.994 kg (198 lb 6.4 oz)] 89.994 kg (198 lb 6.4 oz) (05/18 1126) Last BM Date: 05/13/16  Intake/Output from previous day: 05/17 0701 - 05/18 0700 In: 1610 [P.O.:240; I.V.:1178] Out: 951 [Urine:950; Stool:1] Intake/Output this shift:      Lab Results:  Recent Labs  05/12/16 1810 05/13/16 0215 05/13/16 1007 05/13/16 1837 05/14/16 0316  WBC 5.0 4.6 5.1 5.8 4.8  HGB 9.1* 8.9* 9.1* 9.7* 8.5*  HCT 29.1* 29.3* 30.6* 31.8* 28.1*  PLT 222 205 212 224 208   BMET  Recent Labs  05/11/16 1556 05/12/16 0322 05/14/16 0316  NA 136 139 137  K 3.9 4.1 4.3  CL 101 102 102  CO2 '26 26 28  '$ CREATININE 2.43* 2.37* 2.10*   LFT  Recent Labs  05/12/16 0322  PROT 6.5  AST 37  ALT 16*  ALKPHOS 46  BILITOT 1.1   PT/INR  Recent Labs  05/11/16 1823  LABPROT 20.2*  INR 1.73*   PANCREAS No results for input(s): LIPASE in the last 72 hours.       Studies/Results: No results found.  Medications: I have reviewed the patient's current medications.  Assessment/Plan: 1. Chronic GI Bleeding. Probably due to AVMs. Had ablated in duodenum and another in the distal SB. He will be at risk for anticoagulation but definitely needs it. I think he would be good candidate for Watchman procedrue with routine IV iron while anticoagulated.  [facility] . Would suggest routine monitering of Hg and iron level and IV iron infusions when the anticoagulation resumed. Please call us for any problems.   Michaell Grider  JR,Kearstyn Avitia L 05/14/2016, 11:49 AM  This note was created using voice recognition software. Minor errors may Have occurred unintentionally.  Pager: [phone] If no answer or after hours call [phone]

## 2016-05-14 NOTE — Progress Notes (Signed)
Notified by patient's nurse that patient is adamant about being discharged after being transferred from [facility].  Spoke with patient for about 45 minutes trying to explain that he was not medically stable for discharge with needing to restart his anticoagulation and adjust medications.  Paged Jettie Booze NP with cardiology who came and spoke with patient.  Paged and spoke with Dr. Maryland Pink, he had spoken with patient twice on 2C and was willing to come back and speak with patient to explain again why he was not medically stable for discharge.  When offered to have Dr. Maryland Pink come and speak with patient, patient stated he did not want to speak with him again.  Offered to have another medical physician come and speak with patient and he stated he only wanted to speak with someone if they were coming to discharge him.  Explained to patient he was not medically cleared for discharge.  Patient started to remove his heart monitor and stated he was leaving no matter what.  Primary nurse Gwen attempted to get patient to sign AMA form and patient refused.  Sanda Linger

## 2016-05-14 NOTE — Progress Notes (Signed)
Inpatient Diabetes Program Recommendations  AACE/ADA: New Consensus Statement on Inpatient Glycemic Control (2015)  Target Ranges:  Prepandial:   less than 140 mg/dL      Peak postprandial:   less than 180 mg/dL (1-2 hours)      Critically ill patients:  140 - 180 mg/dL  Results for [name], [name] (MRN [id number]) as of 05/14/2016 09:44  Ref. Range 05/13/2016 15:46 05/13/2016 19:38 05/13/2016 23:47 05/14/2016 04:43 05/14/2016 08:13  Glucose-Capillary Latest Ref Range: 65-99 mg/dL 188 (H) 321 (H) 282 (H) 138 (H) 163 (H)   Review of Glycemic Control  Diabetes history: DM Type 2 Outpatient Diabetes medications: Regular insulin 5 units ac breakfast + 5 units ac supper Current orders for Inpatient glycemic control: Novolog correction 0-9 units q 4 hrs.  Inpatient Diabetes Program Recommendations:  Noted CBGs elevated post prandial. May consider adding Novolog 2-3 units tid with meals (hold if eats <50%).  Thank you, Nani Gasser. Korinne Greenstein, RN, MSN, CDE Inpatient Glycemic Control Team Team Pager [phone] (8am-5pm) 05/14/2016 9:47 AM

## 2016-05-14 NOTE — Progress Notes (Signed)
Pt Arrived to floor at 1120, pt immediately expressing desire to be discharged, explained to pt that for his safety he needed to stay for time ordered by physician, explained the need to stay for continued observation post procedure and for medication changes, patient remained unwilling to stay, MD notified, Charge nurse and PA advised pt concerning his medical status and medications encouraged pt to stay for treatment, pt still unwilling to stay, refuses to sign AMA papers, IVs and telemetry removed per pt request, pt left facility via w/c.  Edward Qualia RN

## 2016-05-14 NOTE — Progress Notes (Signed)
[facility] PROGRESS NOTE  [name] HYQ:[id number] DOB: 1936/03/17 DOA: 05/11/2016  PCP: Beacher May, MD  Brief HPI: 80 year old African-American male with a past medical history of coronary artery disease, diastolic CHF, hypertension, diabetes, history of atrial fibrillation on anticoagulation with a history of GI bleed, presented with chest pain, along with a history of black colored stool. Patient had elevated troponins. He was seen by cardiology. He was also seen by gastroenterology. His anticoagulation was discontinued. Patient underwent EGD and ablation of the AVM.  Past medical history:  Past Medical History  Diagnosis Date  . Hyperlipidemia   . HTN (hypertension)     echo 2/11: EF 50-55%, diast dyfxn, severe LVH, inf HK, LAE  . CAD (coronary artery disease)     a.  NSTEMI treated with PCI Feb 2011 with a DES.([facility]);   b. cath 2/11: D1 stents x 2 ok, AV groove CFX occluded with dist AV CFX filled by L-L collats, RCA occluded, OM2 90-95% (treated with PCI)  . MI (myocardial infarction) ([place])   . PVD (peripheral vascular disease) ([place])   . DM (diabetes mellitus) ([place])   . GERD (gastroesophageal reflux disease)   . Bipolar disorder ([place])   . Prostate cancer [facility])     status post radiation therapy in 2003  . Pulmonary edema   . Atrial fibrillation ([place])   . Respiratory difficulty 06/24/2014    intubated   . Hypertensive emergency 06/24/2014  . CKD (chronic kidney disease), stage III   . Anemia 11/28/2015  . Shortness of breath dyspnea   . Stroke [facility]) 07/2014    Consultants: Cardiology. Gastroenterology  Procedures:  EGD - Normal larynx. - Small hiatal hernia. - Normal stomach. - Normal duodenal bulb, first portion of the duodenum and second portion of the duodenum. - A single recently bleeding angiodysplastic lesion in the duodenum. Treated with argon beam coagulation. - Normal examined jejunum. - Normal fourth portion of the duodenum. - The  examination was otherwise normal. -  No specimens collected.   Transthoracic echocardiogram Study Conclusions - Left ventricle: The cavity size was mildly dilated. There was moderate concentric hypertrophy. Systolic function was severely reduced. The estimated ejection fraction was in the range of 25% to 30%. Diffuse hypokinesis with akinesis of the basal inferolateral wall. The study is not technically sufficient to  allow evaluation of LV diastolic function. - Aortic valve: Transvalvular velocity was within the normal range. There was no stenosis. There was mild regurgitation. - Mitral valve: Mobility of the anterior and posterior leaflet was restricted. There was mild regurgitation. - Left atrium: The atrium was severely dilated. - Right ventricle: The cavity size was normal. Wall thickness was normal. Systolic function was moderately reduced. - Right atrium: The atrium was mildly dilated. - Atrial septum: No defect or patent foramen ovale was identified by color flow Doppler. - Tricuspid valve: There was mild regurgitation. - Pulmonary arteries: Systolic pressure was moderately increased. PA peak pressure: 51 mm Hg (S). - Inferior vena cava: The vessel was dilated. The respirophasic diameter changes were blunted (< 50%), consistent with elevated central venous pressure. Impressions: - Unable to view echo from 07/2014, but LVEF appears to be reduced since that time.   Antibiotics: None  Subjective: Patient feels well. Denies any complaints. Wishes to go home. Denies any chest pain or shortness of breath. No blood in the stool or black stool since yesterday morning.  Objective:  Vital Signs  Filed Vitals:   05/13/16 1541 05/13/16 1935 05/13/16  2344 05/14/16 0355  BP: 140/74 126/87 163/67 121/71  Pulse: 93 55 57 104  Temp: 97.3 F (36.3 C) 97.9 F (36.6 C) 98.3 F (36.8 C) 98.5 F (36.9 C)  TempSrc: Oral Oral Oral Oral  Resp: '19 16 14 13  '$ Height:      Weight:      SpO2:  100% 94% 95% 96%    Intake/Output Summary (Last 24 hours) at 05/14/16 2536 Last data filed at 05/14/16 0600  Gross per 24 hour  Intake   1393 ml  Output    951 ml  Net    442 ml   Filed Weights   05/11/16 1557 05/11/16 2035  Weight: 82.555 kg (182 lb) 85 kg (187 lb 6.3 oz)    General appearance: alert, cooperative, appears stated age and no distress Resp: clear to auscultation bilaterally Cardio: regular rate and rhythm, S1, S2 normal, no murmur, click, rub or gallop GI: soft, non-tender; bowel sounds normal; no masses,  no organomegaly Neurologic: Patient is awake, alert. Oriented 3. No cranial nerve deficits. Motor strength equal bilateral upper and lower extremities.  Lab Results:  Data Reviewed: I have personally reviewed following labs and imaging studies  CBC:  Recent Labs Lab 05/11/16 1556  05/12/16 1810 05/13/16 0215 05/13/16 1007 05/13/16 1837 05/14/16 0316  WBC 4.9  < > 5.0 4.6 5.1 5.8 4.8  NEUTROABS 3.9  --   --   --   --   --   --   HGB 6.9*  < > 9.1* 8.9* 9.1* 9.7* 8.5*  HCT 22.9*  < > 29.1* 29.3* 30.6* 31.8* 28.1*  MCV 88.4  < > 84.6 84.4 85.0 87.1 85.7  PLT 232  < > 222 205 212 224 208  < > = values in this interval not displayed. Basic Metabolic Panel:  Recent Labs Lab 05/11/16 1556 05/12/16 0322 05/14/16 0316  NA 136 139 137  K 3.9 4.1 4.3  CL 101 102 102  CO2 '26 26 28  '$ GLUCOSE 325* 233* 168*  BUN 36* 39* 32*  CREATININE 2.43* 2.37* 2.10*  CALCIUM 8.5* 8.4* 8.2*  MG  --  1.6*  --   PHOS  --  4.0  --    GFR: Estimated Creatinine Clearance: 32.3 mL/min (by C-G formula based on Cr of 2.1).  Liver Function Tests:  Recent Labs Lab 05/12/16 0322  AST 37  ALT 16*  ALKPHOS 46  BILITOT 1.1  PROT 6.5  ALBUMIN 2.5*   Coagulation Profile:  Recent Labs Lab 05/11/16 1823  INR 1.73*   Cardiac Enzymes:  Recent Labs Lab 05/12/16 0322 05/12/16 0956  TROPONINI 2.28* 2.66*   HbA1C:  Recent Labs  05/11/16 1556  HGBA1C 7.8*    CBG:  Recent Labs Lab 05/13/16 0807 05/13/16 1546 05/13/16 1938 05/13/16 2347 05/14/16 0443  GLUCAP 137* 188* 321* 282* 138*   Thyroid Function Tests:  Recent Labs  05/12/16 0320  TSH 0.750   Urine analysis:    Component Value Date/Time   COLORURINE YELLOW 08/25/2014 [place] 08/25/2014 1602   LABSPEC 1.008 08/25/2014 1602   PHURINE 6.5 08/25/2014 1602   GLUCOSEU NEGATIVE 08/25/2014 1602   HGBUR TRACE* 08/25/2014 1602   BILIRUBINUR NEGATIVE 08/25/2014 1602   KETONESUR NEGATIVE 08/25/2014 1602   PROTEINUR NEGATIVE 08/25/2014 1602   UROBILINOGEN 1.0 08/25/2014 1602   NITRITE NEGATIVE 08/25/2014 1602   LEUKOCYTESUR NEGATIVE 08/25/2014 1602    Recent Results (from the past 240 hour(s))  MRSA PCR  Screening     Status: None   Collection Time: 05/11/16  9:05 PM  Result Value Ref Range Status   MRSA by PCR NEGATIVE NEGATIVE Final    Comment:        The GeneXpert MRSA Assay (FDA approved for NASAL specimens only), is one component of a comprehensive MRSA colonization surveillance program. It is not intended to diagnose MRSA infection nor to guide or monitor treatment for MRSA infections.       Radiology Studies: No results found.   Medications:  Scheduled: . atorvastatin  20 mg Oral QHS  . cilostazol  50 mg Oral Q12H  . finasteride  5 mg Oral Daily  . gabapentin  300 mg Oral BID  . insulin aspart  0-9 Units Subcutaneous Q4H  . metoprolol  25 mg Oral BID  . nicotine  14 mg Transdermal Daily  . [START ON 05/15/2016] pantoprazole  40 mg Oral Daily  . sodium chloride flush  3 mL Intravenous Q12H  . tamsulosin  0.4 mg Oral Daily   Continuous:   VFI:EPPIRJJOACZYS **OR** acetaminophen, guaiFENesin, HYDROcodone-acetaminophen, hydrOXYzine, ondansetron **OR** ondansetron (ZOFRAN) IV, simethicone  Assessment/Plan:  Principal Problem:   Chest pain with high risk of acute coronary syndrome Active Problems:   DM (diabetes mellitus), type 2  with renal complications ([place])   Hyperlipidemia associated with type 2 diabetes mellitus ([place])   Essential hypertension   CAD- last PCI 2011   Chest pain, unspecified   Edema   Chronic atrial fibrillation (HCC)   Tobacco abuse   Acute on chronic diastolic congestive heart failure (HCC)   CKD (chronic kidney disease), stage III   CVA (cerebral infarction)-Augb 2015   Anemia   GIB (gastrointestinal bleeding)   GI bleed-recent SB study-AVMs   Melena   Demand ischemia (HCC)   Chronic anticoagulation-Eliquis   Elevated troponin   Acute blood loss anemia   Acute on chronic kidney failure (HCC)   Symptomatic anemia    Demand ischemia in the setting of known coronary artery disease Troponin was noted to be significantly elevated. Patient was seen by cardiology. Presentation was thought to be secondary to demand ischemia. Echocardiogram as above. No plans for any further workup per cardiology at this time. Outpatient stress test will be considered.  GI bleed with melena Patient has a history of GI bleeding. He has been seen by gastroenterology in the past. Recent small bowel study revealed AVMs. Patient underwent EGD yesterday morning. AVM was ablated yesterday. Hasn't had any further episodes of GI bleeding at this time. Continue PPI. Discussed with Dr. Watt Climes with gastroenterology. Okay to resume anticoagulation at this time. Close monitoring of hemoglobin as outpatient.  Chronic atrial fibrillation Anticoagulation was discontinued due to GI bleeding. Cardiology feels that his risks of adverse effect would be very high due to the GI bleed as well as alcoholism and initially did not recommend restarting the anticoagulation at this time. However, his CHADS2Vasc score is 8 and so places him at very high risk for embolic stroke. Discussed again with cardiology and GI this morning. Okay to resume anticoagulation but would like to do so tomorrow. He will need close monitoring of his hemoglobin as  outpatient. Cardiology to also consider watchman device feasibility as outpatient.  Chronic systolic and diastolic CHF Ejection fraction is further reduced to 25-30% on echocardiogram from this admission compared to the one from 2015. Hypokinesis of the inferior and inferolateral walls noted. Moderate MR was seen. Patient appears to be euvolemic. Continue to monitor  clinically. No ACE inhibitor due to renal failure.  Acute blood loss anemia Secondary to GI bleed. Patient was transfused 2 units. Hemoglobin remains stable. Continue to monitor.  Essential hypertension Blood pressure remains stable. Continue to monitor.   Tobacco abuse Continue nicotine patch.   DM (diabetes mellitus), type 2 with renal complications Continue sliding scale coverage. HbA1c is 7.8.   History of CVA Aug 2015 Stable.  Hyperlipidemia associated with type 2 diabetes mellitus Stable. Continue Lipitor   Acute on chronic kidney failure, stage 3-4 Monitor renal function closely. Probably close to baseline at this time. Monitor urine output.   History of alcohol abuse Watch for symptoms of withdrawal. Patient has been counseled, however, apparently he enjoys drinking alcohol. No plan for discontinuing. This also increases the risk for adverse effect with anticoagulation.  History of peripheral artery disease Stable. Medical risk reduction.  DVT Prophylaxis: SCDs    Code Status: Full code  Family Communication: Discussed with the patient  Disposition Plan: Transfer to telemetry floor. Anticipate discharge tomorrow. Mobilize today. PT and OT evaluation.     LOS: 3 days   [facility] Pager [phone] 05/14/2016, 8:06 AM  If 7PM-7AM, please contact night-coverage at [email or web address], password [facility]

## 2016-05-14 NOTE — Progress Notes (Signed)
Patient Name: [name] Date of Encounter: 05/14/2016  Principal Problem:   Chest pain with high risk of acute coronary syndrome Active Problems:   DM (diabetes mellitus), type 2 with renal complications ([place])   Hyperlipidemia associated with type 2 diabetes mellitus ([place])   Essential hypertension   CAD- last PCI 2011   Chest pain, unspecified   Edema   Chronic atrial fibrillation (HCC)   Tobacco abuse   Acute on chronic diastolic congestive heart failure (HCC)   CKD (chronic kidney disease), stage III   CVA (cerebral infarction)-Augb 2015   Anemia   GIB (gastrointestinal bleeding)   GI bleed-recent SB study-AVMs   Melena   Demand ischemia (HCC)   Chronic anticoagulation-Eliquis   Elevated troponin   Acute blood loss anemia   Acute on chronic kidney failure (HCC)   Symptomatic anemia   Length of Stay: 3  SUBJECTIVE  No angina, breathing comfortably. EGD showed one AVM with suggestion of recent bleeding in the 3rd segment of duodenum treated with laser coagulation. No overt bleeding now.  CURRENT MEDS . atorvastatin  20 mg Oral QHS  . cilostazol  50 mg Oral Q12H  . finasteride  5 mg Oral Daily  . gabapentin  300 mg Oral BID  . insulin aspart  0-9 Units Subcutaneous Q4H  . metoprolol  25 mg Oral BID  . nicotine  14 mg Transdermal Daily  . [START ON 05/15/2016] pantoprazole  40 mg Oral Daily  . sodium chloride flush  3 mL Intravenous Q12H  . tamsulosin  0.4 mg Oral Daily    OBJECTIVE   Intake/Output Summary (Last 24 hours) at 05/14/16 0748 Last data filed at 05/14/16 0600  Gross per 24 hour  Intake   1418 ml  Output    951 ml  Net    467 ml   Filed Weights   05/11/16 1557 05/11/16 2035  Weight: 82.555 kg (182 lb) 85 kg (187 lb 6.3 oz)    PHYSICAL EXAM Filed Vitals:   05/13/16 1541 05/13/16 1935 05/13/16 2344 05/14/16 0355  BP: 140/74 126/87 163/67 121/71  Pulse: 93 55 57 104  Temp: 97.3 F (36.3 C) 97.9 F (36.6 C) 98.3 F (36.8 C) 98.5 F  (36.9 C)  TempSrc: Oral Oral Oral Oral  Resp: '19 16 14 13  '$ Height:      Weight:      SpO2: 100% 94% 95% 96%   General: Alert, oriented x3, no distress Head: no evidence of trauma, PERRL, EOMI, no exophtalmos or lid lag, no myxedema, no xanthelasma; normal ears, nose and oropharynx Neck: normal jugular venous pulsations and no hepatojugular reflux; brisk carotid pulses without delay and no carotid bruits Chest: clear to auscultation, no signs of consolidation by percussion or palpation, normal fremitus, symmetrical and full respiratory excursions Cardiovascular: normal position and quality of the apical impulse, irregular rhythm, normal first and widely split second heart sounds, no rubs or gallops, no murmur Abdomen: no tenderness or distention, no masses by palpation, no abnormal pulsatility or arterial bruits, normal bowel sounds, no hepatosplenomegaly Extremities: no clubbing, cyanosis or edema; 2+ radial, ulnar and brachial pulses bilaterally; 2+ right femoral, posterior tibial and dorsalis pedis pulses; 2+ left femoral, posterior tibial and dorsalis pedis pulses; no subclavian or femoral bruits Neurological: grossly nonfocal  LABS  CBC  Recent Labs  05/11/16 1556  05/13/16 1837 05/14/16 0316  WBC 4.9  < > 5.8 4.8  NEUTROABS 3.9  --   --   --  HGB 6.9*  < > 9.7* 8.5*  HCT 22.9*  < > 31.8* 28.1*  MCV 88.4  < > 87.1 85.7  PLT 232  < > 224 208  < > = values in this interval not displayed. Basic Metabolic Panel  Recent Labs  05/12/16 0322 05/14/16 0316  NA 139 137  K 4.1 4.3  CL 102 102  CO2 26 28  GLUCOSE 233* 168*  BUN 39* 32*  CREATININE 2.37* 2.10*  CALCIUM 8.4* 8.2*  MG 1.6*  --   PHOS 4.0  --    Liver Function Tests  Recent Labs  05/12/16 0322  AST 37  ALT 16*  ALKPHOS 46  BILITOT 1.1  PROT 6.5  ALBUMIN 2.5*   No results for input(s): LIPASE, AMYLASE in the last 72 hours. Cardiac Enzymes  Recent Labs  05/12/16 0322 05/12/16 0956  TROPONINI  2.28* 2.66*   BNP Invalid input(s): POCBNP D-Dimer No results for input(s): DDIMER in the last 72 hours. Hemoglobin A1C  Recent Labs  05/11/16 1556  HGBA1C 7.8*   Fasting Lipid Panel No results for input(s): CHOL, HDL, LDLCALC, TRIG, CHOLHDL, LDLDIRECT in the last 72 hours. Thyroid Function Tests  Recent Labs  05/12/16 0320  TSH 0.750    Radiology Studies Imaging results have been reviewed and No results found.  TELE AFib with controlled rate   ASSESSMENT AND PLAN 1.CAD s/p LCX STEMI 2011; acute Demand ischemia with small NSTEMI on this admission Since transfusion he has been asymptomatic. No plan for invasive evaluation or treatment on this admission. Consider outpatient nuclear study and only perform cath if there is a very large area of threatening ischemia.  2. Persistent atrial fibrillation  Rate controlled. Very high stroke risk: This patients CHA2DS2-VASc Score and unadjusted Ischemic Stroke Rate (% per year) is equal to 10.8 % stroke rate/year from a score of 8 Above score calculated as 1 point each if present [CHF, HTN, DM, hx CAD and MI, Age > 69, history of Stroke]. Dr. Watt Climes suggested we can restart anticoagulation. Discussed with Dr. [place] Pink, will wait a day before resuming anticoagulation.  Outpatient f/u with Dr. Burt Knack to review whether he would be a good Watchman candidate (issues of advanced age, alcohol abuse and still need for temporary warfarin anticoagulation). I would be very reluctant to continue long term anticoagulation otherwise.  3. Chronic systolic and diastolic heart failure EF was 40-45% in 2015, dropped to 25-30%, same inferior-inferolateral hypokinesis, moderate MR unchanged. Appears clinically euvolemic today, but CXR 05/15 and increased PA pressure on echo suggests some degree of hypervolemia. He is comfortable lying flat.  4. 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 Improved Hgb after  transfusion. Had coagulation of D3 AVM yesterday  6. Acute on CKD stage 3-4 Improving and close to baseline; another problem that makes angiography high risk.  7. Alcohol abuse He buys 3-4 half gallons of liquour every month. He enjoys drinking and does not plan to stop. Watch for symptoms of withdrawal.  8. Tobacco abuse He understands that this may be deleterious to his health, but since he has seen no convincing adverse events (in his opinion) and since "he is so old" , he has no intention of quitting.  9. Severe PAD 2014 - right ABI is 0.39, left 0.75. Segmental pressures suggest significant bilateral iliofemoral occlusive disease, left infrapopliteal and multisegmental right occlusive disease. Triphasic waveforms at the left femoral level, monophasic throughout their more distal left lower  extremity and throughout the right lower extremity. No significant carotid disease 2015 [place].   Sanda Klein, MD, [facility] [phone] office [phone] pager 05/14/2016 7:48 AM

## 2016-05-14 NOTE — Discharge Summary (Signed)
[facility]  Physician Discharge Summary   Patient ID: [name] MRN: [id number] DOB/AGE: 1936/05/07 80 y.o.  Admit date: 05/11/2016 Discharge date: 05/14/2016  PCP: Beacher May, MD  DISCHARGE DIAGNOSES:  Principal Problem:   Chest pain with high risk of acute coronary syndrome Active Problems:   DM (diabetes mellitus), type 2 with renal complications ([place])   Hyperlipidemia associated with type 2 diabetes mellitus ([place])   Essential hypertension   CAD- last PCI 2011   Chest pain, unspecified   Edema   Chronic atrial fibrillation (HCC)   Tobacco abuse   Acute on chronic diastolic congestive heart failure (HCC)   CKD (chronic kidney disease), stage III   CVA (cerebral infarction)-Augb 2015   Anemia   GIB (gastrointestinal bleeding)   GI bleed-recent SB study-AVMs   Melena   Demand ischemia (HCC)   Chronic anticoagulation-Eliquis   Elevated troponin   Acute blood loss anemia   Acute on chronic kidney failure (HCC)   Symptomatic anemia    PATIENT LEFT AGAINST MEDICAL ADVICE   INITIAL HISTORY: 80 year old African-American male with a past medical history of coronary artery disease, diastolic CHF, hypertension, diabetes, history of atrial fibrillation on anticoagulation with a history of GI bleed, presented with chest pain, along with a history of black colored stool. Patient had elevated troponins. He was seen by cardiology. He was also seen by gastroenterology. His anticoagulation was discontinued. Patient underwent EGD and ablation of the AVM.  Consultants: Cardiology. Gastroenterology  Procedures:  EGD - Normal larynx. - Small hiatal hernia. - Normal stomach. - Normal duodenal bulb, first portion of the duodenum and second portion of the duodenum. - A single recently bleeding angiodysplastic lesion in the duodenum. Treated with argon beam coagulation. - Normal examined jejunum. - Normal fourth portion of the duodenum. - The examination was  otherwise normal. - No specimens collected.   Transthoracic echocardiogram Study Conclusions - Left ventricle: The cavity size was mildly dilated. There was moderate concentric hypertrophy. Systolic function was severely reduced. The estimated ejection fraction was in the range of 25% to 30%. Diffuse hypokinesis with akinesis of the basal inferolateral wall. The study is not technically sufficient to  allow evaluation of LV diastolic function. - Aortic valve: Transvalvular velocity was within the normal range. There was no stenosis. There was mild regurgitation. - Mitral valve: Mobility of the anterior and posterior leaflet was restricted. There was mild regurgitation. - Left atrium: The atrium was severely dilated. - Right ventricle: The cavity size was normal. Wall thickness was normal. Systolic function was moderately reduced. - Right atrium: The atrium was mildly dilated. - Atrial septum: No defect or patent foramen ovale was identified by color flow Doppler. - Tricuspid valve: There was mild regurgitation. - Pulmonary arteries: Systolic pressure was moderately increased. PA peak pressure: 51 mm Hg (S). - Inferior vena cava: The vessel was dilated. The respirophasic diameter changes were blunted (< 50%), consistent with elevated central venous pressure. Impressions: - Unable to view echo from 07/2014, but LVEF appears to be reduced since that time.   HOSPITAL COURSE:   PATIENT LEFT AGAINST MEDICAL ADVICE  Demand ischemia in the setting of known coronary artery disease Troponin was noted to be significantly elevated. Patient was seen by cardiology. Presentation was thought to be secondary to demand ischemia. Echocardiogram as above. No plans for any further workup per cardiology at this time. Outpatient stress test was to be considered.  GI bleed with melena Patient has a history of GI bleeding. He  has been seen by gastroenterology in the past. Recent small bowel study revealed  AVMs. Patient underwent EGD yesterday morning. AVM was ablated yesterday. Hasn't had any further episodes of GI bleeding at this time. Continue PPI. Discussed with Dr. Watt Climes with gastroenterology. Okay to resume anticoagulation at this time. Close monitoring of hemoglobin as outpatient.  Chronic atrial fibrillation Anticoagulation was discontinued due to GI bleeding. Cardiology feels that his risks of adverse effect would be very high due to the GI bleed as well as alcoholism and initially did not recommend restarting the anticoagulation at this time. However, his CHADS2Vasc score is 8 and so places him at very high risk for embolic stroke. Discussed again with cardiology and GI this morning. Okay to resume anticoagulation but would like to do so tomorrow. He will need close monitoring of his hemoglobin as outpatient. Cardiology to also consider feasibility of watchman device as outpatient.  Chronic systolic and diastolic CHF Ejection fraction is further reduced to 25-30% on echocardiogram from this admission compared to the one from 2015. Hypokinesis of the inferior and inferolateral walls noted. Moderate MR was seen. Patient appears to be euvolemic. No ACE inhibitor due to renal failure.  Acute blood loss anemia Secondary to GI bleed. Patient was transfused 2 units. Hemoglobin remains stable.   Essential hypertension Blood pressure remains stable. Continue to monitor.   Tobacco abuse Continue nicotine patch.   DM (diabetes mellitus), type 2 with renal complications Continue sliding scale coverage. HbA1c is 7.8.   History of CVA Aug 2015 Stable.  Hyperlipidemia associated with type 2 diabetes mellitus Stable. Continue Lipitor   Acute on chronic kidney failure, stage 3-4 Monitor renal function closely. Probably close to baseline at this time. Monitor urine output.   History of alcohol abuse Patient was counseled, however, apparently he enjoys drinking alcohol. No plan for  discontinuing. This also increases the risk for adverse effect with anticoagulation. He did not have any signs or symptoms of withdrawal.  History of peripheral artery disease Stable. Medical risk reduction.  Patient was adamant about leaving today. He was explained that we wanted to monitor him for an additional day to make sure his hemoglobin remained stable and then to resume his anticoagulation. He was transferred from stepdown unit to telemetry today. However, he persisted with his demand of being discharged. And then subsequently left AGAINST MEDICAL ADVICE.   PERTINENT LABS:  The results of significant diagnostics from this hospitalization (including imaging, microbiology, ancillary and laboratory) are listed below for reference.    Microbiology: Recent Results (from the past 240 hour(s))  MRSA PCR Screening     Status: None   Collection Time: 05/11/16  9:05 PM  Result Value Ref Range Status   MRSA by PCR NEGATIVE NEGATIVE Final    Comment:        The GeneXpert MRSA Assay (FDA approved for NASAL specimens only), is one component of a comprehensive MRSA colonization surveillance program. It is not intended to diagnose MRSA infection nor to guide or monitor treatment for MRSA infections.      Labs: Basic Metabolic Panel:  Recent Labs Lab 05/11/16 1556 05/12/16 0322 05/14/16 0316  NA 136 139 137  K 3.9 4.1 4.3  CL 101 102 102  CO2 '26 26 28  '$ GLUCOSE 325* 233* 168*  BUN 36* 39* 32*  CREATININE 2.43* 2.37* 2.10*  CALCIUM 8.5* 8.4* 8.2*  MG  --  1.6*  --   PHOS  --  4.0  --    Liver  Function Tests:  Recent Labs Lab 05/12/16 0322  AST 37  ALT 16*  ALKPHOS 46  BILITOT 1.1  PROT 6.5  ALBUMIN 2.5*   CBC:  Recent Labs Lab 05/11/16 1556  05/12/16 1810 05/13/16 0215 05/13/16 1007 05/13/16 1837 05/14/16 0316  WBC 4.9  < > 5.0 4.6 5.1 5.8 4.8  NEUTROABS 3.9  --   --   --   --   --   --   HGB 6.9*  < > 9.1* 8.9* 9.1* 9.7* 8.5*  HCT 22.9*  < > 29.1*  29.3* 30.6* 31.8* 28.1*  MCV 88.4  < > 84.6 84.4 85.0 87.1 85.7  PLT 232  < > 222 205 212 224 208  < > = values in this interval not displayed. Cardiac Enzymes:  Recent Labs Lab 05/12/16 0322 05/12/16 0956  TROPONINI 2.28* 2.66*   CBG:  Recent Labs Lab 05/13/16 1938 05/13/16 2347 05/14/16 0443 05/14/16 0813 05/14/16 1124  GLUCAP 321* 282* 138* 163* 214*     IMAGING STUDIES Dg Chest 2 View  05/11/2016  CLINICAL DATA:  Left anterior chest pain. EXAM: CHEST  2 VIEW COMPARISON:  11/28/2015 FINDINGS: Right upper mediastinal prominence. Atherosclerotic aortic arch. Mild to moderate enlargement of the cardiopericardial silhouette with indistinct pulmonary vasculature. No airspace edema. Minimal blunting of the right costophrenic angle, as before. Faint interstitial accentuation especially in the lower lobes. IMPRESSION: 1. Mild cardiomegaly with pulmonary venous hypertension and likely interstitial edema. 2. Trace right pleural effusion. 3. Right upper mediastinal prominence is chronic and probably vascular ; this could be further confirmed with CT if clinically warranted. Electronically Signed   By: Van Clines M.D.   On: 05/11/2016 17:32    ALLERGIES:  Allergies  Allergen Reactions  . Penicillins Other (See Comments)    Has patient had a PCN reaction causing immediate rash, facial/tongue/throat swelling, SOB or lightheadedness with hypotension: unknown Has patient had a PCN reaction causing severe rash involving mucus membranes or skin necrosis: unknown Has patient had a PCN reaction that required hospitalization unknown Has patient had a PCN reaction occurring within the last 10 years: unknown If all of the above answers are "NO", then may proceed with Cephalosporin use.    There are no discharge instructions or discharge medication list as patient left AMA.   [facility] Pager [phone]  05/14/2016, 3:52 PM

## 2016-07-08 ENCOUNTER — Observation Stay (HOSPITAL_COMMUNITY)
Admission: EM | Admit: 2016-07-08 | Discharge: 2016-07-09 | Disposition: A | Discharge status: Home / Self Care | Payer: Medicare Other | Attending: Internal Medicine | Admitting: Internal Medicine

## 2016-07-08 ENCOUNTER — Encounter (HOSPITAL_COMMUNITY): Payer: Self-pay | Admitting: Emergency Medicine

## 2016-07-08 DIAGNOSIS — I2510 Atherosclerotic heart disease of native coronary artery without angina pectoris: Secondary | ICD-10-CM

## 2016-07-08 DIAGNOSIS — N183 Chronic kidney disease, stage 3 unspecified: Secondary | ICD-10-CM | POA: Diagnosis present

## 2016-07-08 DIAGNOSIS — Z8546 Personal history of malignant neoplasm of prostate: Secondary | ICD-10-CM | POA: Insufficient documentation

## 2016-07-08 DIAGNOSIS — E119 Type 2 diabetes mellitus without complications: Secondary | ICD-10-CM | POA: Diagnosis not present

## 2016-07-08 DIAGNOSIS — Z794 Long term (current) use of insulin: Secondary | ICD-10-CM | POA: Insufficient documentation

## 2016-07-08 DIAGNOSIS — E160 Drug-induced hypoglycemia without coma: Secondary | ICD-10-CM | POA: Diagnosis present

## 2016-07-08 DIAGNOSIS — I129 Hypertensive chronic kidney disease with stage 1 through stage 4 chronic kidney disease, or unspecified chronic kidney disease: Secondary | ICD-10-CM | POA: Insufficient documentation

## 2016-07-08 DIAGNOSIS — I10 Essential (primary) hypertension: Secondary | ICD-10-CM | POA: Diagnosis present

## 2016-07-08 DIAGNOSIS — T383X5A Adverse effect of insulin and oral hypoglycemic [antidiabetic] drugs, initial encounter: Secondary | ICD-10-CM

## 2016-07-08 DIAGNOSIS — I639 Cerebral infarction, unspecified: Secondary | ICD-10-CM | POA: Diagnosis present

## 2016-07-08 DIAGNOSIS — E1129 Type 2 diabetes mellitus with other diabetic kidney complication: Secondary | ICD-10-CM | POA: Diagnosis present

## 2016-07-08 DIAGNOSIS — I252 Old myocardial infarction: Secondary | ICD-10-CM | POA: Diagnosis not present

## 2016-07-08 DIAGNOSIS — D649 Anemia, unspecified: Secondary | ICD-10-CM | POA: Diagnosis present

## 2016-07-08 DIAGNOSIS — I482 Chronic atrial fibrillation, unspecified: Secondary | ICD-10-CM | POA: Diagnosis present

## 2016-07-08 DIAGNOSIS — I5032 Chronic diastolic (congestive) heart failure: Secondary | ICD-10-CM

## 2016-07-08 DIAGNOSIS — Z7901 Long term (current) use of anticoagulants: Secondary | ICD-10-CM | POA: Diagnosis not present

## 2016-07-08 DIAGNOSIS — Z9861 Coronary angioplasty status: Secondary | ICD-10-CM

## 2016-07-08 DIAGNOSIS — D62 Acute posthemorrhagic anemia: Secondary | ICD-10-CM | POA: Diagnosis not present

## 2016-07-08 DIAGNOSIS — T383X1A Poisoning by insulin and oral hypoglycemic [antidiabetic] drugs, accidental (unintentional), initial encounter: Principal | ICD-10-CM | POA: Insufficient documentation

## 2016-07-08 DIAGNOSIS — E1122 Type 2 diabetes mellitus with diabetic chronic kidney disease: Secondary | ICD-10-CM

## 2016-07-08 DIAGNOSIS — Z79899 Other long term (current) drug therapy: Secondary | ICD-10-CM | POA: Insufficient documentation

## 2016-07-08 DIAGNOSIS — F1010 Alcohol abuse, uncomplicated: Secondary | ICD-10-CM | POA: Diagnosis not present

## 2016-07-08 DIAGNOSIS — Z8673 Personal history of transient ischemic attack (TIA), and cerebral infarction without residual deficits: Secondary | ICD-10-CM | POA: Insufficient documentation

## 2016-07-08 DIAGNOSIS — E162 Hypoglycemia, unspecified: Secondary | ICD-10-CM | POA: Diagnosis present

## 2016-07-08 DIAGNOSIS — F17210 Nicotine dependence, cigarettes, uncomplicated: Secondary | ICD-10-CM | POA: Diagnosis not present

## 2016-07-08 DIAGNOSIS — Z720 Tobacco use: Secondary | ICD-10-CM

## 2016-07-08 LAB — BASIC METABOLIC PANEL
Anion gap: 8 (ref 5–15)
BUN: 14 mg/dL (ref 6–20)
CHLORIDE: 103 mmol/L (ref 101–111)
CO2: 28 mmol/L (ref 22–32)
CREATININE: 1.61 mg/dL — AB (ref 0.61–1.24)
Calcium: 8.9 mg/dL (ref 8.9–10.3)
GFR calc Af Amer: 46 mL/min — ABNORMAL LOW (ref >60)
GFR, EST NON AFRICAN AMERICAN: 40 mL/min — AB (ref >60)
GLUCOSE: 155 mg/dL — AB (ref 65–99)
POTASSIUM: 3.5 mmol/L (ref 3.5–5.1)
Sodium: 139 mmol/L (ref 135–145)

## 2016-07-08 LAB — CBC WITH DIFFERENTIAL/PLATELET
Basophils Absolute: 0 10*3/uL (ref 0.0–0.1)
Basophils Relative: 1 %
EOS PCT: 4 %
Eosinophils Absolute: 0.2 10*3/uL (ref 0.0–0.7)
HCT: 36.3 % — ABNORMAL LOW (ref 39.0–52.0)
Hemoglobin: 11.4 g/dL — ABNORMAL LOW (ref 13.0–17.0)
LYMPHS ABS: 1.5 10*3/uL (ref 0.7–4.0)
LYMPHS PCT: 31 %
MCH: 26.6 pg (ref 26.0–34.0)
MCHC: 31.4 g/dL (ref 30.0–36.0)
MCV: 84.6 fL (ref 78.0–100.0)
MONO ABS: 0.5 10*3/uL (ref 0.1–1.0)
MONOS PCT: 10 %
Neutro Abs: 2.7 10*3/uL (ref 1.7–7.7)
Neutrophils Relative %: 54 %
PLATELETS: 235 10*3/uL (ref 150–400)
RBC: 4.29 MIL/uL (ref 4.22–5.81)
RDW: 17.4 % — AB (ref 11.5–15.5)
WBC: 4.8 10*3/uL (ref 4.0–10.5)

## 2016-07-08 LAB — CBG MONITORING, ED
GLUCOSE-CAPILLARY: 119 mg/dL — AB (ref 65–99)
GLUCOSE-CAPILLARY: 133 mg/dL — AB (ref 65–99)
GLUCOSE-CAPILLARY: 49 mg/dL — AB (ref 65–99)
Glucose-Capillary: 181 mg/dL — ABNORMAL HIGH (ref 65–99)
Glucose-Capillary: 88 mg/dL (ref 65–99)
Glucose-Capillary: 73 mg/dL (ref 65–99)
Glucose-Capillary: 63 mg/dL — ABNORMAL LOW (ref 65–99)
Glucose-Capillary: 55 mg/dL — ABNORMAL LOW (ref 65–99)
Glucose-Capillary: 155 mg/dL — ABNORMAL HIGH (ref 65–99)
Glucose-Capillary: 54 mg/dL — ABNORMAL LOW (ref 65–99)

## 2016-07-08 MED ORDER — DEXTROSE 50 % IV SOLN
INTRAVENOUS | Status: AC
  Filled 2016-07-08: qty 50

## 2016-07-08 MED ORDER — BENZONATATE 100 MG PO CAPS
100.0000 mg | ORAL_CAPSULE | Freq: Once | ORAL | Status: AC
  Administered 2016-07-08: 100 mg via ORAL
  Filled 2016-07-08: qty 1

## 2016-07-08 MED ORDER — DEXTROSE 10 % IV SOLN
INTRAVENOUS | Status: DC
  Administered 2016-07-08: 20:00:00 via INTRAVENOUS

## 2016-07-08 MED ORDER — LIDOCAINE VISCOUS 2 % MT SOLN
15.0000 mL | Freq: Once | OROMUCOSAL | Status: AC
  Administered 2016-07-08: 15 mL via OROMUCOSAL
  Filled 2016-07-08: qty 15

## 2016-07-08 MED ORDER — DEXTROSE 50 % IV SOLN
INTRAVENOUS | Status: AC
Start: 2016-07-08 — End: 2016-07-09
  Filled 2016-07-08: qty 50

## 2016-07-08 MED ORDER — DEXTROSE 50 % IV SOLN
1.0000 | Freq: Once | INTRAVENOUS | Status: AC
  Administered 2016-07-08: 50 mL via INTRAVENOUS

## 2016-07-08 MED ORDER — DEXTROSE 50 % IV SOLN
1.0000 | Freq: Once | INTRAVENOUS | Status: AC
  Administered 2016-07-08: 50 mL via INTRAVENOUS
  Filled 2016-07-08: qty 50

## 2016-07-08 NOTE — ED Notes (Signed)
CBG 181.  

## 2016-07-08 NOTE — ED Notes (Signed)
Pt arrives via [facility], ems reports pt was supposed to take 5 units of regular insulin and 40 units of lantus, pt reports he accidentally gave himself 40 units of regular insulin instead. EMS reports CBG of 233. Pt a/ox4, NAD

## 2016-07-08 NOTE — ED Notes (Signed)
Dr. Billy Fischer aware of patient's CBG, advised this RN to try to get patient to eat a sandwich as well as the peanut crackers and orange juice.

## 2016-07-08 NOTE — ED Notes (Signed)
CBG 88, Dr. Ralene Bathe made aware, MD advised this RN to encourage patient to eat and drink something else at this time

## 2016-07-08 NOTE — ED Notes (Signed)
Cheryl from Poison control called for an update on patient, cheryl made aware of patient's recent blood glucose and that he received amp of d50 at 1655

## 2016-07-08 NOTE — Discharge Instructions (Signed)

## 2016-07-08 NOTE — ED Notes (Signed)
Unable to locate Dr Billy Fischer to make her aware of patient's CBG, patient given something else to eat and drink, this RN to let MD know as soon as possible.

## 2016-07-08 NOTE — ED Notes (Signed)
CBG: 55 Informed Scientist, clinical (histocompatibility and immunogenetics)

## 2016-07-08 NOTE — ED Notes (Signed)
This RN contacted poison control per Dr. Kallie Edward request, poison control made aware that patient took 40 units of regular insulin, poison control advised that patient should be kept at minimum 5 hours from time of exposure for monitoring of blood glucose. Poison control advised that if patient's blood glucose remains WDL and patient has no episodes of hypoglycemia after the 5 hour mark, the MD should reevaluate and decide if patient is stable enough to be discharged or needs to remain in ED for further monitoring.

## 2016-07-08 NOTE — ED Notes (Signed)
Dr. Ralene Bathe made aware of CBG of 23, MD advised this RN to continue to monitor CBG and recheck again in 30 minutes.

## 2016-07-08 NOTE — H&P (Signed)
[name] EXN:[id number] DOB: 07/15/1936 DOA: 07/08/2016     PCP: Beacher May, MD  [place], Oncology [place], Cardiology [place] Patient coming from: [place]    Chief Complaint: Accidental insulin overdose  HPI: [name] is a 80 y.o. male with medical history significant of coronary artery disease, diastolic CHF, hypertension, diabetes, history of atrial fibrillation on anticoagulation with a history of GI bleed,   elevated troponins. diastolic heart failure, hypertension, diabetes, prostate cancer, history of stroke, medical noncompliance    Presented with accidental self overdose of insulin he by accident took 40 units of regular insulin instead of Lantus EMS was called on arrival CBG was 233.   he states his regimen has been change in the has confused them. Since his admission he have not had any melena or chest pain shortness of breath. Patient called 911 soreness noticed mistake he to take the insulin dose at 1 PM. Reports stool has been darker but its because of medications he has been taking (iron) He drinks 1/2 gallon [place] a week but states no hx of withdrawals  Regarding pertinent Chronic problems: admitted in the May the chest pain and evidence of melanotic stool undergone EGD showed single recent bleeding angiodysplastic lesion treated with Argon and has known history of AVMs in the past. He has chronic atrial fibrillation his anticoagulation has been stopped secondary to GI bleeding his CHADS2Vasc score is 8 after a lengthy discussion his ambulation was restarted Echogram in May showed EF 25/30% diffuse hypokinesis with akinesis of the basal anterolateral wall Patient diabetes last hemoglobin A1c was 7.8 he has history of CVA in August 2015 have been doing well since then  Patient has known history of coronary artery disease with an non-ST elevation MI in 2011 status post PCI with  DES cath 2/11: D1 stents x 2 ok, AV groove CFX occluded with dist AV CFX filled by L-L collats, RCA occluded, OM2 90-95% (treated with PCI) History of prostate cancer status post radiation therapy in 2003   IN ER: Patient was given food and dextrose D50 3 with dextrose 10% infusion but continues to have episodic hypoglycemia Afebrile heart rate up to 106 blood pressure 148/82 WBC 4.8 hemoglobin 11.4 cranial 1.61 glucose is fluctuated as well as 49 currently up to 133 at 10 PM    Hospitalist was called for admission for accidental insulin overdose resulting in hypoglycemia  Review of Systems:    Pertinent positives include: Fatigue jitteriness  Constitutional:  No weight loss, night sweats, Fevers, chills,  weight loss  HEENT:  No headaches, Difficulty swallowing,Tooth/dental problems,Sore throat,  No sneezing, itching, ear ache, nasal congestion, post nasal drip,  Cardio-vascular:  No chest pain, Orthopnea, PND, anasarca, dizziness, palpitations.no Bilateral lower extremity swelling  GI:  No heartburn, indigestion, abdominal pain, nausea, vomiting, diarrhea, change in bowel habits, loss of appetite, melena, blood in stool, hematemesis Resp:  no shortness of breath at rest. No dyspnea on exertion, No excess mucus, no productive cough, No non-productive cough, No coughing up of blood.No change in color of mucus.No wheezing. Skin:  no rash or lesions. No jaundice GU:  no dysuria, change in color of urine, no urgency or frequency. No straining to urinate.  No flank pain.  Musculoskeletal:  No joint pain or no joint swelling. No decreased range of motion. No back pain.  Psych:  No change in mood or affect. No depression or anxiety. No memory loss.  Neuro:  no localizing neurological complaints, no tingling, no weakness, no double vision, no gait abnormality, no slurred speech, no confusion  As per HPI otherwise 10 point review of systems negative.   Past Medical History: Past  Medical History  Diagnosis Date  . Hyperlipidemia   . HTN (hypertension)     echo 2/11: EF 50-55%, diast dyfxn, severe LVH, inf HK, LAE  . CAD (coronary artery disease)     a.  NSTEMI treated with PCI Feb 2011 with a DES.([facility]);   b. cath 2/11: D1 stents x 2 ok, AV groove CFX occluded with dist AV CFX filled by L-L collats, RCA occluded, OM2 90-95% (treated with PCI)  . MI (myocardial infarction) ([facility])   . PVD (peripheral vascular disease) ([place])   . DM (diabetes mellitus) ([place])   . GERD (gastroesophageal reflux disease)   . Bipolar disorder ([place])   . Prostate cancer [facility])     status post radiation therapy in 2003  . Pulmonary edema   . Atrial fibrillation ([place])   . Respiratory difficulty 06/24/2014    intubated   . Hypertensive emergency 06/24/2014  . CKD (chronic kidney disease), stage III   . Anemia 11/28/2015  . Shortness of breath dyspnea   . Stroke [facility]) 07/2014   Past Surgical History  Procedure Laterality Date  . Femoral bypass  2003  . Esophagogastroduodenoscopy N/A 11/29/2015    Procedure: ESOPHAGOGASTRODUODENOSCOPY (EGD);  Surgeon: Wonda Horner, MD;  Location: [facility] ENDOSCOPY;  Service: Endoscopy;  Laterality: N/A;  . Esophagogastroduodenoscopy (egd) with propofol Left 12/25/2015    Procedure: ESOPHAGOGASTRODUODENOSCOPY (EGD) WITH PROPOFOL;  Surgeon: Arta Silence, MD;  Location: [facility] ENDOSCOPY;  Service: Endoscopy;  Laterality: Left;  Freda Munro capsule study N/A 01/26/2016    Procedure: GIVENS CAPSULE STUDY;  Surgeon: Ronald Lobo, MD;  Location: [facility] ENDOSCOPY;  Service: Endoscopy;  Laterality: N/A;  . Colonoscopy N/A 01/25/2016    Procedure: COLONOSCOPY;  Surgeon: Ronald Lobo, MD;  Location: [facility] ENDOSCOPY;  Service: Endoscopy;  Laterality: N/A;  . Esophagogastroduodenoscopy N/A 05/13/2016    Procedure: ESOPHAGOGASTRODUODENOSCOPY (EGD);  Surgeon: Clarene Essex, MD;  Location: [facility] ENDOSCOPY;  Service: Endoscopy;  Laterality: N/A;  . Hot hemostasis N/A 05/13/2016    Procedure:  HOT HEMOSTASIS (ARGON PLASMA COAGULATION/BICAP);  Surgeon: Clarene Essex, MD;  Location[facility] ENDOSCOPY;  Service: Endoscopy;  Laterality: N/A;     Social History:  Ambulatory independently  Or  Kasandra Knudsen     reports that he has been smoking Cigarettes.  He has a 25 pack-year smoking history. He has never used smokeless tobacco. He reports that he drinks alcohol. He reports that he uses illicit drugs (Marijuana and Cocaine).  Allergies:   Allergies  Allergen Reactions  . Penicillins Swelling    Has patient had a PCN reaction causing immediate rash, facial/tongue/throat swelling, SOB or lightheadedness with hypotension: Yes Has patient had a PCN reaction causing severe rash involving mucus membranes or skin necrosis: No Has patient had a PCN reaction that required hospitalization: No Has patient had a PCN reaction occurring within the last 10 years: No If all of the above answers are "NO", then may proceed with Cephalosporin use.       Family History:    Family History  Problem Relation Age of Onset  . Cancer Mother   . Cancer Father   . Cancer Sister     Medications: Prior to Admission medications   Medication Sig Start Date End Date Taking? Authorizing Provider  acetaminophen (TYLENOL) 325 MG tablet Take 650  mg by mouth every 8 (eight) hours as needed for mild pain or fever (pain).     Historical Provider, MD  albuterol (PROVENTIL HFA;VENTOLIN HFA) 108 (90 BASE) MCG/ACT inhaler Inhale 2 puffs into the lungs every 6 (six) hours as needed for shortness of breath.    Historical Provider, MD  ammonium lactate (LAC-HYDRIN) 12 % lotion Apply 1 application topically daily.    Historical Provider, MD  apixaban (ELIQUIS) 2.5 MG TABS tablet Take 1 tablet (2.5 mg total) by mouth 2 (two) times daily. Patient not taking: Reported on 05/11/2016 01/27/16   Jule Ser, DO  apixaban (ELIQUIS) 2.5 MG TABS tablet Take 2.5 mg by mouth every 12 (twelve) hours.    Historical Provider, MD  atorvastatin  (LIPITOR) 40 MG tablet Take 1 tablet (40 mg total) by mouth daily. Patient taking differently: Take 20 mg by mouth at bedtime.  08/01/14   Sherren Mocha, MD  cholecalciferol (VITAMIN D) 1000 UNITS tablet Take 1,000 Units by mouth daily.    Historical Provider, MD  cilostazol (PLETAL) 50 MG tablet Take 50 mg by mouth every 12 (twelve) hours.     Historical Provider, MD  dextrose (GLUTOSE) 40 % GEL Take 1 Tube by mouth daily as needed for low blood sugar.     Historical Provider, MD  diclofenac sodium (VOLTAREN) 1 % GEL Apply 2 g topically 4 (four) times daily. Patient not taking: Reported on 05/11/2016 11/29/15   Alexa Angela Burke, MD  feeding supplement, GLUCERNA SHAKE, (GLUCERNA SHAKE) LIQD Take 237 mLs by mouth 3 (three) times daily between meals.    Historical Provider, MD  finasteride (PROSCAR) 5 MG tablet Take 5 mg by mouth daily.      Historical Provider, MD  furosemide (LASIX) 40 MG tablet Take 40 mg by mouth daily.    Historical Provider, MD  gabapentin (NEURONTIN) 300 MG capsule Take 300 mg by mouth 2 (two) times daily.     Historical Provider, MD  guaifenesin (HUMIBID E) 400 MG TABS tablet Take 400 mg by mouth 3 (three) times daily as needed. For congestion    Historical Provider, MD  insulin regular (NOVOLIN R,HUMULIN R) 100 units/mL injection Inject 5 Units into the skin See admin instructions. Inject 5 units under the skin before breakfast and inject 5 units before evening meal    Historical Provider, MD  metoprolol (LOPRESSOR) 50 MG tablet Take 50 mg by mouth 2 (two) times daily.    Historical Provider, MD  Multiple Vitamins-Minerals (MULTIVITAMIN WITH MINERALS) tablet Take 1 tablet by mouth daily.    Historical Provider, MD  nystatin (MYCOSTATIN) 100000 UNIT/ML suspension Take 5 mLs (500,000 Units total) by mouth 4 (four) times daily. 11/29/15   Alexa Angela Burke, MD  pantoprazole (PROTONIX) 40 MG tablet Take 1 tablet (40 mg total) by mouth daily. 11/29/15   Florinda Marker, MD  simethicone  (MYLICON) 80 MG chewable tablet Chew 80 mg by mouth 3 (three) times daily as needed for flatulence. Take with meals as needed    Historical Provider, MD  tadalafil (CIALIS) 10 MG tablet Take 10 mg by mouth daily as needed for erectile dysfunction.    Historical Provider, MD  Tamsulosin HCl (FLOMAX) 0.4 MG CAPS Take 0.4 mg by mouth daily.      Historical Provider, MD    Physical Exam: Patient Vitals for the past 24 hrs:  BP Temp Temp src Pulse Resp SpO2  07/08/16 2215 148/82 mmHg - - 98 - 97 %  07/08/16 2200  154/88 mmHg - - 94 - 98 %  07/08/16 2145 145/78 mmHg - - 98 - 98 %  07/08/16 2130 127/82 mmHg - - 98 - 96 %  07/08/16 2115 121/80 mmHg - - 95 - 99 %  07/08/16 2045 120/72 mmHg - - 95 - 95 %  07/08/16 2030 123/77 mmHg - - 96 - 98 %  07/08/16 1945 133/95 mmHg - - 102 18 93 %  07/08/16 1930 123/69 mmHg - - 95 - 93 %  07/08/16 1915 138/68 mmHg - - 101 - 97 %  07/08/16 1900 143/73 mmHg - - 103 - 95 %  07/08/16 1845 122/75 mmHg - - 91 - 96 %  07/08/16 1830 134/75 mmHg - - 103 - 95 %  07/08/16 1800 140/71 mmHg - - - - 95 %  07/08/16 1745 131/86 mmHg - - 90 - 98 %  07/08/16 1730 133/67 mmHg - - 98 - 95 %  07/08/16 1715 162/73 mmHg - - 97 - 95 %  07/08/16 1645 131/77 mmHg - - - - 94 %  07/08/16 1630 136/64 mmHg - - 98 - 92 %  07/08/16 1615 132/64 mmHg - - - - 94 %  07/08/16 1600 140/86 mmHg - - 82 - 93 %  07/08/16 1452 155/83 mmHg - - (!) 58 - 94 %  07/08/16 1445 155/83 mmHg - - - - 94 %  07/08/16 1415 - - - 100 - -  07/08/16 1403 165/87 mmHg 98.1 F (36.7 C) Oral 106 20 96 %  07/08/16 1400 160/74 mmHg - - 105 - 95 %    1. General:  in No Acute distress 2. Psychological: Alert and   Oriented 3. Head/ENT:   Moist  Mucous Membranes                          Head Non traumatic, neck supple                           Poor Dentition 4. SKIN: normal  Skin turgor,  Skin clean Dry and intact no rash 5. Heart: Regular rate and rhythm no  Murmur, Rub or gallop 6. Lungs:  no wheezes or  crackles   7. Abdomen: Soft, non-tender, Non distended 8. Lower extremities: no clubbing, cyanosis, or edema 9. Neurologically Grossly intact, moving all 4 extremities equally 10. MSK: Normal range of motion   body mass index is unknown because there is no weight on file.  Labs on Admission:   Labs on Admission: I have personally reviewed following labs and imaging studies  CBC:  Recent Labs Lab 07/08/16 1412  WBC 4.8  NEUTROABS 2.7  HGB 11.4*  HCT 36.3*  MCV 84.6  PLT 419   Basic Metabolic Panel:  Recent Labs Lab 07/08/16 1412  NA 139  K 3.5  CL 103  CO2 28  GLUCOSE 155*  BUN 14  CREATININE 1.61*  CALCIUM 8.9   GFR: CrCl cannot be calculated (Unknown ideal weight.). Liver Function Tests: No results for input(s): AST, ALT, ALKPHOS, BILITOT, PROT, ALBUMIN in the last 168 hours. No results for input(s): LIPASE, AMYLASE in the last 168 hours. No results for input(s): AMMONIA in the last 168 hours. Coagulation Profile: No results for input(s): INR, PROTIME in the last 168 hours. Cardiac Enzymes: No results for input(s): CKTOTAL, CKMB, CKMBINDEX, TROPONINI in the last 168 hours. BNP (last 3 results) No results for input(s):  PROBNP in the last 8760 hours. HbA1C: No results for input(s): HGBA1C in the last 72 hours. CBG:  Recent Labs Lab 07/08/16 1753 07/08/16 1901 07/08/16 2014 07/08/16 2116 07/08/16 2157  GLUCAP 55* 155* 54* 49* 133*   Lipid Profile: No results for input(s): CHOL, HDL, LDLCALC, TRIG, CHOLHDL, LDLDIRECT in the last 72 hours. Thyroid Function Tests: No results for input(s): TSH, T4TOTAL, FREET4, T3FREE, THYROIDAB in the last 72 hours. Anemia Panel: No results for input(s): VITAMINB12, FOLATE, FERRITIN, TIBC, IRON, RETICCTPCT in the last 72 hours. Urine analysis:    Component Value Date/Time   COLORURINE YELLOW 08/25/2014 [place] 08/25/2014 1602   LABSPEC 1.008 08/25/2014 1602   PHURINE 6.5 08/25/2014 1602    GLUCOSEU NEGATIVE 08/25/2014 1602   HGBUR TRACE* 08/25/2014 1602   BILIRUBINUR NEGATIVE 08/25/2014 1602   KETONESUR NEGATIVE 08/25/2014 1602   PROTEINUR NEGATIVE 08/25/2014 1602   UROBILINOGEN 1.0 08/25/2014 1602   NITRITE NEGATIVE 08/25/2014 1602   LEUKOCYTESUR NEGATIVE 08/25/2014 1602   Sepsis Labs: '@LABRCNTIP'$ (procalcitonin:4,lacticidven:4) )No results found for this or any previous visit (from the past 240 hour(s)).    UA not obtained   Lab Results  Component Value Date   HGBA1C 7.8* 05/11/2016    CrCl cannot be calculated (Unknown ideal weight.).  BNP (last 3 results) No results for input(s): PROBNP in the last 8760 hours.   ECG REPORT Not obtained  There were no vitals filed for this visit.   Cultures:    Component Value Date/Time   SDES URINE, CLEAN CATCH 10/19/2014 0015   SPECREQUEST NONE 10/19/2014 0015   CULT  10/18/2014 1937    NO GROWTH 5 DAYS Performed at [place] 10/19/2014 FINAL 10/19/2014 0015     Radiological Exams on Admission: No results found.  Chart has been reviewed   Assessment/Plan  80 y.o. male with medical history significant of coronary artery disease, diastolic CHF, hypertension, diabetes, history of atrial fibrillation on anticoagulation with a history of GI bleed,   elevated troponins. diastolic heart failure, hypertension, diabetes, prostate cancer, history of stroke, medical noncompliance accidental insulin overdose  Present on Admission:  . Hypoglycemia Can't accidental insulin overdose. Will hold insulin for now monitor blood sugars carefully continue glucose 10 at 50 mils an hour  . Anemia stable continue to monitor  . Chronic atrial fibrillation (HCC) continue anticoagulation CHADS2Vasc score is 8, need to monitor for compliance given history of alcohol abuse  . CKD (chronic kidney disease), stage III currently stable continue to monitor  . CVA (cerebral infarction)-Augb 2015 currently asymptomatic  .  Diastolic CHF, chronic (HCC) appears to be euvolemic continue Lasix avoid fluid overload  . DM (diabetes mellitus), type 2 with renal complications (HCC) hold insulin given recent insulin overdose  . Essential hypertension stable continue home medications  . Tobacco abuse patient states he is not interested in quitting at this point  . Alcohol abuse - CIWA protocol  Other plan as per orders.  DVT prophylaxis:  eliquis  Code Status:  FULL CODE   as per patient    Family Communication:   Family   at  Bedside    Disposition Plan:                             Back to current facility when stable    Consults called:  none    Admission status:    obs    Level  of care      medical floor       I have spent a total of 56 min on this admission   Doral Ventrella 07/09/2016, 1:12 AM    [facility]  Pager [phone]   after 2 AM please page floor coverage PA If 7AM-7PM, please contact the day team taking care of the patient  [email or web address]  Password [email or web address]

## 2016-07-08 NOTE — ED Notes (Addendum)
CBG 155 

## 2016-07-08 NOTE — ED Provider Notes (Signed)
CSN: [id number]     Arrival date & time 07/08/16  1353 History   First MD Initiated Contact with Patient 07/08/16 1357     Chief Complaint  Patient presents with  . Drug Overdose     Patient is a 80 y.o. male presenting with Overdose. The history is provided by the patient. No language interpreter was used.  Drug Overdose   [name] is a 80 y.o. male who presents to the Emergency Department complaining of accidental overdose.  He states that his insulin regimen was changed recently and he accidentally took 40 units of regular insulin this afternoon when he was supposed to take 5 Units. Currently he is  asymptomatic and he ate lunch today prior to taking his insulin. He called 911 when he noticed his mistake. He took the insulin today at 1 PM.   Past Medical History  Diagnosis Date  . Hyperlipidemia   . HTN (hypertension)     echo 2/11: EF 50-55%, diast dyfxn, severe LVH, inf HK, LAE  . CAD (coronary artery disease)     a.  NSTEMI treated with PCI Feb 2011 with a DES.([facility]);   b. cath 2/11: D1 stents x 2 ok, AV groove CFX occluded with dist AV CFX filled by L-L collats, RCA occluded, OM2 90-95% (treated with PCI)  . MI (myocardial infarction) ([place])   . PVD (peripheral vascular disease) ([place])   . DM (diabetes mellitus) ([place])   . GERD (gastroesophageal reflux disease)   . Bipolar disorder ([place])   . Prostate cancer [facility])     status post radiation therapy in 2003  . Pulmonary edema   . Atrial fibrillation ([place])   . Respiratory difficulty 06/24/2014    intubated   . Hypertensive emergency 06/24/2014  . CKD (chronic kidney disease), stage III   . Anemia 11/28/2015  . Shortness of breath dyspnea   . Stroke [facility]) 07/2014   Past Surgical History  Procedure Laterality Date  . Femoral bypass  2003  . Esophagogastroduodenoscopy N/A 11/29/2015    Procedure: ESOPHAGOGASTRODUODENOSCOPY (EGD);  Surgeon: Wonda Horner, MD;  Location: [facility] ENDOSCOPY;  Service: Endoscopy;  Laterality:  N/A;  . Esophagogastroduodenoscopy (egd) with propofol Left 12/25/2015    Procedure: ESOPHAGOGASTRODUODENOSCOPY (EGD) WITH PROPOFOL;  Surgeon: Arta Silence, MD;  Location: [facility] ENDOSCOPY;  Service: Endoscopy;  Laterality: Left;  Freda Munro capsule study N/A 01/26/2016    Procedure: GIVENS CAPSULE STUDY;  Surgeon: Ronald Lobo, MD;  Location: [facility] ENDOSCOPY;  Service: Endoscopy;  Laterality: N/A;  . Colonoscopy N/A 01/25/2016    Procedure: COLONOSCOPY;  Surgeon: Ronald Lobo, MD;  Location: [facility] ENDOSCOPY;  Service: Endoscopy;  Laterality: N/A;  . Esophagogastroduodenoscopy N/A 05/13/2016    Procedure: ESOPHAGOGASTRODUODENOSCOPY (EGD);  Surgeon: Clarene Essex, MD;  Location: [facility] ENDOSCOPY;  Service: Endoscopy;  Laterality: N/A;  . Hot hemostasis N/A 05/13/2016    Procedure: HOT HEMOSTASIS (ARGON PLASMA COAGULATION/BICAP);  Surgeon: Clarene Essex, MD;  Location: [facility] ENDOSCOPY;  Service: Endoscopy;  Laterality: N/A;   Family History  Problem Relation Age of Onset  . Cancer Mother   . Cancer Father   . Cancer Sister    Social History  Substance Use Topics  . Smoking status: Current Every Day Smoker -- 0.50 packs/day for 50 years    Types: Cigarettes  . Smokeless tobacco: Never Used     Comment: He has a 50-pack-year hx of tobacco abuse. Currently, smoking about half a pack a day.  . Alcohol Use: Yes  Comment: Previously drank heavily, and now has a drink every other day or so.    Review of Systems  All other systems reviewed and are negative.     Allergies  Penicillins  Home Medications   Prior to Admission medications   Medication Sig Start Date End Date Taking? Authorizing Provider  acetaminophen (TYLENOL) 325 MG tablet Take 650 mg by mouth every 8 (eight) hours as needed for mild pain or fever (pain).     Historical Provider, MD  albuterol (PROVENTIL HFA;VENTOLIN HFA) 108 (90 BASE) MCG/ACT inhaler Inhale 2 puffs into the lungs every 6 (six) hours as needed for shortness of breath.     Historical Provider, MD  ammonium lactate (LAC-HYDRIN) 12 % lotion Apply 1 application topically daily.    Historical Provider, MD  apixaban (ELIQUIS) 2.5 MG TABS tablet Take 1 tablet (2.5 mg total) by mouth 2 (two) times daily. Patient not taking: Reported on 05/11/2016 01/27/16   Jule Ser, DO  apixaban (ELIQUIS) 2.5 MG TABS tablet Take 2.5 mg by mouth every 12 (twelve) hours.    Historical Provider, MD  atorvastatin (LIPITOR) 40 MG tablet Take 1 tablet (40 mg total) by mouth daily. Patient taking differently: Take 20 mg by mouth at bedtime.  08/01/14   Sherren Mocha, MD  cholecalciferol (VITAMIN D) 1000 UNITS tablet Take 1,000 Units by mouth daily.    Historical Provider, MD  cilostazol (PLETAL) 50 MG tablet Take 50 mg by mouth every 12 (twelve) hours.     Historical Provider, MD  dextrose (GLUTOSE) 40 % GEL Take 1 Tube by mouth daily as needed for low blood sugar.     Historical Provider, MD  diclofenac sodium (VOLTAREN) 1 % GEL Apply 2 g topically 4 (four) times daily. Patient not taking: Reported on 05/11/2016 11/29/15   Alexa Angela Burke, MD  feeding supplement, GLUCERNA SHAKE, (GLUCERNA SHAKE) LIQD Take 237 mLs by mouth 3 (three) times daily between meals.    Historical Provider, MD  finasteride (PROSCAR) 5 MG tablet Take 5 mg by mouth daily.      Historical Provider, MD  furosemide (LASIX) 40 MG tablet Take 40 mg by mouth daily.    Historical Provider, MD  gabapentin (NEURONTIN) 300 MG capsule Take 300 mg by mouth 2 (two) times daily.     Historical Provider, MD  guaifenesin (HUMIBID E) 400 MG TABS tablet Take 400 mg by mouth 3 (three) times daily as needed. For congestion    Historical Provider, MD  insulin regular (NOVOLIN R,HUMULIN R) 100 units/mL injection Inject 5 Units into the skin See admin instructions. Inject 5 units under the skin before breakfast and inject 5 units before evening meal    Historical Provider, MD  metoprolol (LOPRESSOR) 50 MG tablet Take 50 mg by mouth 2 (two) times  daily.    Historical Provider, MD  Multiple Vitamins-Minerals (MULTIVITAMIN WITH MINERALS) tablet Take 1 tablet by mouth daily.    Historical Provider, MD  nystatin (MYCOSTATIN) 100000 UNIT/ML suspension Take 5 mLs (500,000 Units total) by mouth 4 (four) times daily. 11/29/15   Alexa Angela Burke, MD  pantoprazole (PROTONIX) 40 MG tablet Take 1 tablet (40 mg total) by mouth daily. 11/29/15   Florinda Marker, MD  simethicone (MYLICON) 80 MG chewable tablet Chew 80 mg by mouth 3 (three) times daily as needed for flatulence. Take with meals as needed    Historical Provider, MD  tadalafil (CIALIS) 10 MG tablet Take 10 mg by mouth daily as needed for  erectile dysfunction.    Historical Provider, MD  Tamsulosin HCl (FLOMAX) 0.4 MG CAPS Take 0.4 mg by mouth daily.      Historical Provider, MD   BP 155/83 mmHg  Pulse 58  Temp(Src) 98.1 F (36.7 C) (Oral)  Resp 20  SpO2 94% Physical Exam  Constitutional: He is oriented to person, place, and time. He appears well-developed and well-nourished.  HENT:  Head: Normocephalic and atraumatic.  Cardiovascular: Normal rate and regular rhythm.   No murmur heard. Pulmonary/Chest: Effort normal and breath sounds normal. No respiratory distress.  Abdominal: Soft. There is no tenderness. There is no rebound and no guarding.  Musculoskeletal: He exhibits no tenderness.  Trace pitting edema of BLE.   Neurological: He is alert and oriented to person, place, and time.  Skin: Skin is warm and dry.  Psychiatric: He has a normal mood and affect. His behavior is normal.  Nursing note and vitals reviewed.   ED Course  Procedures Labs Review Labs Reviewed  BASIC METABOLIC PANEL - Abnormal; Notable for the following:    Glucose, Bld 155 (*)    Creatinine, Ser 1.61 (*)    GFR calc non Af Amer 40 (*)    GFR calc Af Amer 46 (*)    All other components within normal limits  CBC WITH DIFFERENTIAL/PLATELET - Abnormal; Notable for the following:    Hemoglobin 11.4 (*)    HCT  36.3 (*)    RDW 17.4 (*)    All other components within normal limits  CBG MONITORING, ED - Abnormal; Notable for the following:    Glucose-Capillary 181 (*)    All other components within normal limits  CBG MONITORING, ED    Imaging Review No results found. I have personally reviewed and evaluated these images and lab results as part of my medical decision-making.   EKG Interpretation None      MDM   Final diagnoses:  Overdose of insulin, accidental or unintentional, initial encounter    Patient here for evaluation following accidental insulin overdose. He is a symptomatically emergency department. Plan to monitor per poison control recommendations. Patient care transferred pending repeat assessment.    Quintella Reichert, MD 07/08/16 [phone]

## 2016-07-08 NOTE — ED Notes (Signed)
Pt has various snacks and coke at bedside; requesting medication to help with phlegm

## 2016-07-08 NOTE — ED Notes (Signed)
Patient awake, alert and oriented, advising this RN that he feels fine at this time

## 2016-07-08 NOTE — ED Notes (Signed)
CBG: 63 Informed Ralene Bathe MD

## 2016-07-08 NOTE — ED Notes (Signed)
Patient provided chocolate milk and ginger ale

## 2016-07-09 ENCOUNTER — Encounter (HOSPITAL_COMMUNITY): Payer: Self-pay | Admitting: Internal Medicine

## 2016-07-09 DIAGNOSIS — T383X1A Poisoning by insulin and oral hypoglycemic [antidiabetic] drugs, accidental (unintentional), initial encounter: Secondary | ICD-10-CM | POA: Diagnosis not present

## 2016-07-09 DIAGNOSIS — F1010 Alcohol abuse, uncomplicated: Secondary | ICD-10-CM | POA: Diagnosis present

## 2016-07-09 LAB — CBC
HEMATOCRIT: 35.5 % — AB (ref 39.0–52.0)
Hemoglobin: 10.9 g/dL — ABNORMAL LOW (ref 13.0–17.0)
MCH: 26 pg (ref 26.0–34.0)
MCHC: 30.7 g/dL (ref 30.0–36.0)
MCV: 84.7 fL (ref 78.0–100.0)
PLATELETS: 250 10*3/uL (ref 150–400)
RBC: 4.19 MIL/uL — AB (ref 4.22–5.81)
RDW: 17.4 % — ABNORMAL HIGH (ref 11.5–15.5)
WBC: 5.2 10*3/uL (ref 4.0–10.5)

## 2016-07-09 LAB — GLUCOSE, CAPILLARY
GLUCOSE-CAPILLARY: 202 mg/dL — AB (ref 65–99)
Glucose-Capillary: 140 mg/dL — ABNORMAL HIGH (ref 65–99)

## 2016-07-09 LAB — MRSA PCR SCREENING: MRSA BY PCR: NEGATIVE

## 2016-07-09 LAB — CBG MONITORING, ED: Glucose-Capillary: 126 mg/dL — ABNORMAL HIGH (ref 65–99)

## 2016-07-09 LAB — COMPREHENSIVE METABOLIC PANEL
ALT: 9 U/L — AB (ref 17–63)
AST: 17 U/L (ref 15–41)
Albumin: 2.5 g/dL — ABNORMAL LOW (ref 3.5–5.0)
Alkaline Phosphatase: 38 U/L (ref 38–126)
Anion gap: 8 (ref 5–15)
BUN: 15 mg/dL (ref 6–20)
CHLORIDE: 99 mmol/L — AB (ref 101–111)
CO2: 29 mmol/L (ref 22–32)
CREATININE: 1.59 mg/dL — AB (ref 0.61–1.24)
Calcium: 8.4 mg/dL — ABNORMAL LOW (ref 8.9–10.3)
GFR, EST AFRICAN AMERICAN: 47 mL/min — AB (ref >60)
GFR, EST NON AFRICAN AMERICAN: 40 mL/min — AB (ref >60)
Glucose, Bld: 188 mg/dL — ABNORMAL HIGH (ref 65–99)
POTASSIUM: 4 mmol/L (ref 3.5–5.1)
SODIUM: 136 mmol/L (ref 135–145)
Total Bilirubin: 0.8 mg/dL (ref 0.3–1.2)
Total Protein: 6.8 g/dL (ref 6.5–8.1)

## 2016-07-09 LAB — TSH: TSH: 0.82 u[IU]/mL (ref 0.350–4.500)

## 2016-07-09 LAB — PHOSPHORUS: PHOSPHORUS: 3.5 mg/dL (ref 2.5–4.6)

## 2016-07-09 LAB — MAGNESIUM: Magnesium: 0.9 mg/dL — CL (ref 1.7–2.4)

## 2016-07-09 MED ORDER — ADULT MULTIVITAMIN W/MINERALS CH: 1.0000 | ORAL_TABLET | Freq: Every day | ORAL | Status: DC

## 2016-07-09 MED ORDER — DEXTROSE 10 % IV SOLN: INTRAVENOUS | Status: DC

## 2016-07-09 MED ORDER — GLUCOSE 40 % PO GEL: 1.0000 | Freq: Every day | ORAL | Status: DC | PRN

## 2016-07-09 MED ORDER — LORAZEPAM 1 MG PO TABS
1.0000 mg | ORAL_TABLET | Freq: Four times a day (QID) | ORAL | Status: DC | PRN
  Filled 2016-07-09: qty 1

## 2016-07-09 MED ORDER — LORAZEPAM 1 MG PO TABS
0.0000 mg | ORAL_TABLET | Freq: Four times a day (QID) | ORAL | Status: DC
Start: 2016-07-09 — End: 2016-07-09
  Administered 2016-07-09: 1 mg via ORAL

## 2016-07-09 MED ORDER — GLUCERNA SHAKE PO LIQD: 237.0000 mL | Freq: Three times a day (TID) | ORAL | Status: DC

## 2016-07-09 MED ORDER — ONDANSETRON HCL 4 MG/2ML IJ SOLN: 4.0000 mg | Freq: Four times a day (QID) | INTRAMUSCULAR | Status: DC | PRN

## 2016-07-09 MED ORDER — FINASTERIDE 5 MG PO TABS: 5.0000 mg | ORAL_TABLET | Freq: Every day | ORAL | Status: DC

## 2016-07-09 MED ORDER — VITAMIN B-1 100 MG PO TABS: 100.0000 mg | ORAL_TABLET | Freq: Every day | ORAL | Status: DC

## 2016-07-09 MED ORDER — THIAMINE HCL 100 MG/ML IJ SOLN: 100.0000 mg | Freq: Every day | INTRAMUSCULAR | Status: DC

## 2016-07-09 MED ORDER — FOLIC ACID 1 MG PO TABS: 1.0000 mg | ORAL_TABLET | Freq: Every day | ORAL | Status: DC

## 2016-07-09 MED ORDER — ATORVASTATIN CALCIUM 20 MG PO TABS: 20.0000 mg | ORAL_TABLET | Freq: Every day | ORAL | Status: DC

## 2016-07-09 MED ORDER — LORAZEPAM 1 MG PO TABS: 0.0000 mg | ORAL_TABLET | Freq: Two times a day (BID) | ORAL | Status: DC

## 2016-07-09 MED ORDER — ALBUTEROL SULFATE (2.5 MG/3ML) 0.083% IN NEBU: 2.5000 mg | INHALATION_SOLUTION | Freq: Four times a day (QID) | RESPIRATORY_TRACT | Status: DC | PRN

## 2016-07-09 MED ORDER — GABAPENTIN 300 MG PO CAPS: 300.0000 mg | ORAL_CAPSULE | Freq: Two times a day (BID) | ORAL | Status: DC

## 2016-07-09 MED ORDER — ACETAMINOPHEN 325 MG PO TABS: 650.0000 mg | ORAL_TABLET | Freq: Four times a day (QID) | ORAL | Status: DC | PRN

## 2016-07-09 MED ORDER — FUROSEMIDE 40 MG PO TABS: 40.0000 mg | ORAL_TABLET | Freq: Every day | ORAL | Status: DC

## 2016-07-09 MED ORDER — ONDANSETRON HCL 4 MG PO TABS: 4.0000 mg | ORAL_TABLET | Freq: Four times a day (QID) | ORAL | Status: DC | PRN

## 2016-07-09 MED ORDER — APIXABAN 2.5 MG PO TABS: 2.5000 mg | ORAL_TABLET | Freq: Two times a day (BID) | ORAL | Status: DC

## 2016-07-09 MED ORDER — PANTOPRAZOLE SODIUM 40 MG PO TBEC: 40.0000 mg | DELAYED_RELEASE_TABLET | Freq: Every day | ORAL | Status: DC

## 2016-07-09 MED ORDER — LORAZEPAM 2 MG/ML IJ SOLN: 1.0000 mg | Freq: Four times a day (QID) | INTRAMUSCULAR | Status: DC | PRN

## 2016-07-09 MED ORDER — TAMSULOSIN HCL 0.4 MG PO CAPS: 0.4000 mg | ORAL_CAPSULE | Freq: Every day | ORAL | Status: DC

## 2016-07-09 MED ORDER — CILOSTAZOL 100 MG PO TABS: 50.0000 mg | ORAL_TABLET | Freq: Two times a day (BID) | ORAL | Status: DC

## 2016-07-09 MED ORDER — HYDROCODONE-ACETAMINOPHEN 5-325 MG PO TABS: 1.0000 | ORAL_TABLET | ORAL | Status: DC | PRN

## 2016-07-09 MED ORDER — ACETAMINOPHEN 650 MG RE SUPP: 650.0000 mg | Freq: Four times a day (QID) | RECTAL | Status: DC | PRN

## 2016-07-09 MED ORDER — GUAIFENESIN-DM 100-10 MG/5ML PO SYRP
5.0000 mL | ORAL_SOLUTION | ORAL | Status: DC | PRN
  Administered 2016-07-09: 5 mL via ORAL
  Filled 2016-07-09: qty 5

## 2016-07-09 MED ORDER — METOPROLOL TARTRATE 50 MG PO TABS: 50.0000 mg | ORAL_TABLET | Freq: Two times a day (BID) | ORAL | Status: DC

## 2016-07-09 MED ORDER — ALPRAZOLAM 0.25 MG PO TABS: 0.2500 mg | ORAL_TABLET | Freq: Every evening | ORAL | Status: DC | PRN

## 2016-07-09 NOTE — Progress Notes (Signed)
Pt left AMA, pt advised by writer and advanced practitioner not to leave without further evaluation by MD to determine readiness to discharge.  Pt blood sugar within normal limits however still was receiving D10 up to discharge.  Pt signed AMA form and left unit via ambulation.

## 2016-07-09 NOTE — Progress Notes (Signed)
[name] March 02, 1936 [id number]   Called by RN as pt demanding to leave and wanted to speak with someone. Spoke with with pt at bedside and explained importance of remaining in hospital until discharge and further monitoring of blood sugars. Pt verified understanding, but is choosing to sign out against medical advise.   Patrici Ranks, NP-C [facility] [place]  pgr [phone]

## 2016-07-09 NOTE — ED Notes (Signed)
[place], MD at bedside 

## 2016-07-13 ENCOUNTER — Encounter (HOSPITAL_COMMUNITY): Payer: Self-pay | Admitting: Emergency Medicine

## 2016-07-13 ENCOUNTER — Emergency Department (HOSPITAL_COMMUNITY): Payer: Medicare Other

## 2016-07-13 ENCOUNTER — Inpatient Hospital Stay (HOSPITAL_COMMUNITY)
Admission: EM | Admit: 2016-07-13 | Discharge: 2016-07-17 | DRG: 291 | Disposition: A | Discharge status: Home Health | Payer: Medicare Other | Attending: Internal Medicine | Admitting: Internal Medicine

## 2016-07-13 DIAGNOSIS — I2510 Atherosclerotic heart disease of native coronary artery without angina pectoris: Secondary | ICD-10-CM | POA: Diagnosis present

## 2016-07-13 DIAGNOSIS — I5023 Acute on chronic systolic (congestive) heart failure: Secondary | ICD-10-CM | POA: Insufficient documentation

## 2016-07-13 DIAGNOSIS — R0602 Shortness of breath: Secondary | ICD-10-CM | POA: Diagnosis not present

## 2016-07-13 DIAGNOSIS — I482 Chronic atrial fibrillation, unspecified: Secondary | ICD-10-CM | POA: Diagnosis present

## 2016-07-13 DIAGNOSIS — I130 Hypertensive heart and chronic kidney disease with heart failure and stage 1 through stage 4 chronic kidney disease, or unspecified chronic kidney disease: Principal | ICD-10-CM | POA: Diagnosis present

## 2016-07-13 DIAGNOSIS — E1151 Type 2 diabetes mellitus with diabetic peripheral angiopathy without gangrene: Secondary | ICD-10-CM | POA: Diagnosis present

## 2016-07-13 DIAGNOSIS — F17210 Nicotine dependence, cigarettes, uncomplicated: Secondary | ICD-10-CM | POA: Diagnosis present

## 2016-07-13 DIAGNOSIS — Z880 Allergy status to penicillin: Secondary | ICD-10-CM

## 2016-07-13 DIAGNOSIS — N179 Acute kidney failure, unspecified: Secondary | ICD-10-CM | POA: Diagnosis present

## 2016-07-13 DIAGNOSIS — J9600 Acute respiratory failure, unspecified whether with hypoxia or hypercapnia: Secondary | ICD-10-CM | POA: Diagnosis present

## 2016-07-13 DIAGNOSIS — I161 Hypertensive emergency: Secondary | ICD-10-CM | POA: Diagnosis present

## 2016-07-13 DIAGNOSIS — Z09 Encounter for follow-up examination after completed treatment for conditions other than malignant neoplasm: Secondary | ICD-10-CM

## 2016-07-13 DIAGNOSIS — F419 Anxiety disorder, unspecified: Secondary | ICD-10-CM | POA: Diagnosis present

## 2016-07-13 DIAGNOSIS — F319 Bipolar disorder, unspecified: Secondary | ICD-10-CM | POA: Diagnosis present

## 2016-07-13 DIAGNOSIS — Z794 Long term (current) use of insulin: Secondary | ICD-10-CM

## 2016-07-13 DIAGNOSIS — I252 Old myocardial infarction: Secondary | ICD-10-CM

## 2016-07-13 DIAGNOSIS — Z923 Personal history of irradiation: Secondary | ICD-10-CM

## 2016-07-13 DIAGNOSIS — Z8546 Personal history of malignant neoplasm of prostate: Secondary | ICD-10-CM

## 2016-07-13 DIAGNOSIS — N183 Chronic kidney disease, stage 3 (moderate): Secondary | ICD-10-CM | POA: Diagnosis present

## 2016-07-13 DIAGNOSIS — I10 Essential (primary) hypertension: Secondary | ICD-10-CM | POA: Diagnosis present

## 2016-07-13 DIAGNOSIS — D649 Anemia, unspecified: Secondary | ICD-10-CM | POA: Diagnosis present

## 2016-07-13 DIAGNOSIS — Z7901 Long term (current) use of anticoagulants: Secondary | ICD-10-CM

## 2016-07-13 DIAGNOSIS — I5043 Acute on chronic combined systolic (congestive) and diastolic (congestive) heart failure: Secondary | ICD-10-CM | POA: Diagnosis present

## 2016-07-13 DIAGNOSIS — Z9861 Coronary angioplasty status: Secondary | ICD-10-CM

## 2016-07-13 DIAGNOSIS — Z79899 Other long term (current) drug therapy: Secondary | ICD-10-CM

## 2016-07-13 DIAGNOSIS — I4510 Unspecified right bundle-branch block: Secondary | ICD-10-CM | POA: Diagnosis present

## 2016-07-13 DIAGNOSIS — Z7902 Long term (current) use of antithrombotics/antiplatelets: Secondary | ICD-10-CM

## 2016-07-13 DIAGNOSIS — E785 Hyperlipidemia, unspecified: Secondary | ICD-10-CM | POA: Diagnosis present

## 2016-07-13 DIAGNOSIS — Z8673 Personal history of transient ischemic attack (TIA), and cerebral infarction without residual deficits: Secondary | ICD-10-CM

## 2016-07-13 DIAGNOSIS — N189 Chronic kidney disease, unspecified: Secondary | ICD-10-CM

## 2016-07-13 DIAGNOSIS — E1129 Type 2 diabetes mellitus with other diabetic kidney complication: Secondary | ICD-10-CM | POA: Diagnosis present

## 2016-07-13 DIAGNOSIS — J9601 Acute respiratory failure with hypoxia: Secondary | ICD-10-CM | POA: Diagnosis not present

## 2016-07-13 DIAGNOSIS — K219 Gastro-esophageal reflux disease without esophagitis: Secondary | ICD-10-CM | POA: Diagnosis present

## 2016-07-13 DIAGNOSIS — E1122 Type 2 diabetes mellitus with diabetic chronic kidney disease: Secondary | ICD-10-CM | POA: Diagnosis present

## 2016-07-13 DIAGNOSIS — I509 Heart failure, unspecified: Secondary | ICD-10-CM

## 2016-07-13 DIAGNOSIS — I5033 Acute on chronic diastolic (congestive) heart failure: Secondary | ICD-10-CM | POA: Diagnosis present

## 2016-07-13 HISTORY — DX: Heart failure, unspecified: I50.9

## 2016-07-13 LAB — BASIC METABOLIC PANEL
ANION GAP: 8 (ref 5–15)
BUN: 36 mg/dL — ABNORMAL HIGH (ref 6–20)
CHLORIDE: 102 mmol/L (ref 101–111)
CO2: 27 mmol/L (ref 22–32)
CREATININE: 2.67 mg/dL — AB (ref 0.61–1.24)
Calcium: 8.8 mg/dL — ABNORMAL LOW (ref 8.9–10.3)
GFR calc non Af Amer: 21 mL/min — ABNORMAL LOW (ref >60)
GFR, EST AFRICAN AMERICAN: 25 mL/min — AB (ref >60)
GLUCOSE: 192 mg/dL — AB (ref 65–99)
POTASSIUM: 4.1 mmol/L (ref 3.5–5.1)
Sodium: 137 mmol/L (ref 135–145)

## 2016-07-13 LAB — CBC WITH DIFFERENTIAL/PLATELET
BASOS ABS: 0.1 10*3/uL (ref 0.0–0.1)
Basophils Relative: 1 %
EOS PCT: 3 %
Eosinophils Absolute: 0.2 10*3/uL (ref 0.0–0.7)
HEMATOCRIT: 37 % — AB (ref 39.0–52.0)
HEMOGLOBIN: 11.5 g/dL — AB (ref 13.0–17.0)
LYMPHS ABS: 1.2 10*3/uL (ref 0.7–4.0)
LYMPHS PCT: 22 %
MCH: 26.3 pg (ref 26.0–34.0)
MCHC: 31.1 g/dL (ref 30.0–36.0)
MCV: 84.7 fL (ref 78.0–100.0)
Monocytes Absolute: 0.7 10*3/uL (ref 0.1–1.0)
Monocytes Relative: 12 %
NEUTROS ABS: 3.5 10*3/uL (ref 1.7–7.7)
NEUTROS PCT: 62 %
Platelets: 241 10*3/uL (ref 150–400)
RBC: 4.37 MIL/uL (ref 4.22–5.81)
RDW: 17.4 % — ABNORMAL HIGH (ref 11.5–15.5)
WBC: 5.7 10*3/uL (ref 4.0–10.5)

## 2016-07-13 LAB — GLUCOSE, CAPILLARY
GLUCOSE-CAPILLARY: 195 mg/dL — AB (ref 65–99)
Glucose-Capillary: 225 mg/dL — ABNORMAL HIGH (ref 65–99)
Glucose-Capillary: 226 mg/dL — ABNORMAL HIGH (ref 65–99)

## 2016-07-13 LAB — CBG MONITORING, ED
Glucose-Capillary: 213 mg/dL — ABNORMAL HIGH (ref 65–99)
Glucose-Capillary: 206 mg/dL — ABNORMAL HIGH (ref 65–99)

## 2016-07-13 LAB — PROTIME-INR
INR: 1.6 — AB (ref 0.00–1.49)
Prothrombin Time: 19.1 seconds — ABNORMAL HIGH (ref 11.6–15.2)

## 2016-07-13 LAB — BRAIN NATRIURETIC PEPTIDE: B Natriuretic Peptide: 1283.3 pg/mL — ABNORMAL HIGH (ref 0.0–100.0)

## 2016-07-13 MED ORDER — INSULIN ASPART 100 UNIT/ML ~~LOC~~ SOLN
0.0000 [IU] | Freq: Every day | SUBCUTANEOUS | Status: DC
  Administered 2016-07-15 – 2016-07-16 (×2): 2 [IU] via SUBCUTANEOUS

## 2016-07-13 MED ORDER — SODIUM CHLORIDE 0.9% FLUSH
3.0000 mL | Freq: Two times a day (BID) | INTRAVENOUS | Status: DC
  Administered 2016-07-13 – 2016-07-17 (×9): 3 mL via INTRAVENOUS

## 2016-07-13 MED ORDER — GLUCERNA SHAKE PO LIQD
237.0000 mL | Freq: Three times a day (TID) | ORAL | Status: DC
  Administered 2016-07-13 – 2016-07-17 (×12): 237 mL via ORAL

## 2016-07-13 MED ORDER — ONDANSETRON HCL 4 MG/2ML IJ SOLN: 4.0000 mg | Freq: Four times a day (QID) | INTRAMUSCULAR | Status: DC | PRN

## 2016-07-13 MED ORDER — APIXABAN 2.5 MG PO TABS: 2.5000 mg | ORAL_TABLET | Freq: Two times a day (BID) | ORAL | Status: DC

## 2016-07-13 MED ORDER — FUROSEMIDE 10 MG/ML IJ SOLN
40.0000 mg | Freq: Two times a day (BID) | INTRAMUSCULAR | Status: DC
  Administered 2016-07-13 – 2016-07-17 (×9): 40 mg via INTRAVENOUS
  Filled 2016-07-13 (×9): qty 4

## 2016-07-13 MED ORDER — APIXABAN 2.5 MG PO TABS
2.5000 mg | ORAL_TABLET | Freq: Two times a day (BID) | ORAL | Status: DC
  Administered 2016-07-13 – 2016-07-17 (×9): 2.5 mg via ORAL
  Filled 2016-07-13 (×9): qty 1

## 2016-07-13 MED ORDER — ACETAMINOPHEN 325 MG PO TABS
650.0000 mg | ORAL_TABLET | ORAL | Status: DC | PRN
  Administered 2016-07-16 – 2016-07-17 (×2): 650 mg via ORAL
  Filled 2016-07-13 (×2): qty 2

## 2016-07-13 MED ORDER — TAMSULOSIN HCL 0.4 MG PO CAPS
0.4000 mg | ORAL_CAPSULE | Freq: Every day | ORAL | Status: DC
  Administered 2016-07-13 – 2016-07-17 (×5): 0.4 mg via ORAL
  Filled 2016-07-13 (×5): qty 1

## 2016-07-13 MED ORDER — ATORVASTATIN CALCIUM 20 MG PO TABS
20.0000 mg | ORAL_TABLET | Freq: Every day | ORAL | Status: DC
  Administered 2016-07-13 – 2016-07-16 (×4): 20 mg via ORAL
  Filled 2016-07-13 (×4): qty 1

## 2016-07-13 MED ORDER — LORAZEPAM 2 MG/ML IJ SOLN
0.5000 mg | Freq: Four times a day (QID) | INTRAMUSCULAR | Status: DC | PRN
  Administered 2016-07-13: 0.5 mg via INTRAVENOUS
  Administered 2016-07-17: 1 mg via INTRAVENOUS
  Filled 2016-07-13 (×2): qty 1

## 2016-07-13 MED ORDER — FUROSEMIDE 10 MG/ML IJ SOLN
40.0000 mg | Freq: Once | INTRAMUSCULAR | Status: AC
  Administered 2016-07-13: 40 mg via INTRAVENOUS
  Filled 2016-07-13: qty 4

## 2016-07-13 MED ORDER — SODIUM CHLORIDE 0.9% FLUSH: 3.0000 mL | INTRAVENOUS | Status: DC | PRN

## 2016-07-13 MED ORDER — SODIUM CHLORIDE 0.9 % IV SOLN: 250.0000 mL | INTRAVENOUS | Status: DC | PRN

## 2016-07-13 MED ORDER — INSULIN GLARGINE 100 UNIT/ML ~~LOC~~ SOLN
20.0000 [IU] | Freq: Every day | SUBCUTANEOUS | Status: DC
  Administered 2016-07-13 – 2016-07-14 (×2): 20 [IU] via SUBCUTANEOUS
  Filled 2016-07-13 (×3): qty 0.2

## 2016-07-13 MED ORDER — FINASTERIDE 5 MG PO TABS
5.0000 mg | ORAL_TABLET | Freq: Every day | ORAL | Status: DC
  Administered 2016-07-13 – 2016-07-17 (×5): 5 mg via ORAL
  Filled 2016-07-13 (×5): qty 1

## 2016-07-13 MED ORDER — CILOSTAZOL 50 MG PO TABS
50.0000 mg | ORAL_TABLET | Freq: Two times a day (BID) | ORAL | Status: DC
  Administered 2016-07-13 – 2016-07-17 (×9): 50 mg via ORAL
  Filled 2016-07-13 (×11): qty 1

## 2016-07-13 MED ORDER — INSULIN ASPART 100 UNIT/ML ~~LOC~~ SOLN
0.0000 [IU] | Freq: Three times a day (TID) | SUBCUTANEOUS | Status: DC
  Administered 2016-07-13 (×2): 3 [IU] via SUBCUTANEOUS
  Administered 2016-07-14: 1 [IU] via SUBCUTANEOUS
  Administered 2016-07-14: 5 [IU] via SUBCUTANEOUS
  Administered 2016-07-15 (×2): 3 [IU] via SUBCUTANEOUS
  Administered 2016-07-15 – 2016-07-16 (×2): 5 [IU] via SUBCUTANEOUS

## 2016-07-13 MED ORDER — NYSTATIN 100000 UNIT/ML MT SUSP: 5.0000 mL | Freq: Four times a day (QID) | OROMUCOSAL | Status: DC

## 2016-07-13 MED ORDER — GUAIFENESIN ER 600 MG PO TB12
1200.0000 mg | ORAL_TABLET | Freq: Two times a day (BID) | ORAL | Status: DC
  Administered 2016-07-13 – 2016-07-17 (×9): 1200 mg via ORAL
  Filled 2016-07-13 (×9): qty 2

## 2016-07-13 MED ORDER — ALBUTEROL SULFATE HFA 108 (90 BASE) MCG/ACT IN AERS: 2.0000 | INHALATION_SPRAY | Freq: Four times a day (QID) | RESPIRATORY_TRACT | Status: DC | PRN

## 2016-07-13 MED ORDER — ALBUTEROL SULFATE (2.5 MG/3ML) 0.083% IN NEBU
5.0000 mg | INHALATION_SOLUTION | Freq: Once | RESPIRATORY_TRACT | Status: AC
  Administered 2016-07-13: 5 mg via RESPIRATORY_TRACT
  Filled 2016-07-13: qty 6

## 2016-07-13 MED ORDER — PANTOPRAZOLE SODIUM 40 MG PO TBEC
40.0000 mg | DELAYED_RELEASE_TABLET | Freq: Every day | ORAL | Status: DC
  Administered 2016-07-13 – 2016-07-17 (×5): 40 mg via ORAL
  Filled 2016-07-13 (×5): qty 1

## 2016-07-13 MED ORDER — GABAPENTIN 300 MG PO CAPS
300.0000 mg | ORAL_CAPSULE | Freq: Two times a day (BID) | ORAL | Status: DC
  Administered 2016-07-13 – 2016-07-17 (×9): 300 mg via ORAL
  Filled 2016-07-13 (×4): qty 1
  Filled 2016-07-13: qty 3
  Filled 2016-07-13 (×4): qty 1

## 2016-07-13 MED ORDER — ALBUTEROL SULFATE (2.5 MG/3ML) 0.083% IN NEBU: 2.5000 mg | INHALATION_SOLUTION | Freq: Four times a day (QID) | RESPIRATORY_TRACT | Status: DC | PRN

## 2016-07-13 NOTE — ED Notes (Signed)
Pt began to experience SOB which becomes worse upon exertion.  He reports having a "cough for months".  EMS reports his O2 level was 92 on room air and 94% when placed on 2 L.  He resides at an independent living facility.

## 2016-07-13 NOTE — ED Notes (Signed)
CBG: 206. RN notified

## 2016-07-13 NOTE — Progress Notes (Signed)
New pt admission from ED. Pt brought to the floor in stable condition. Vitals taken. Patient verbalized understanding. Will continue to monitor pt.

## 2016-07-13 NOTE — ED Notes (Signed)
Checked CBG 213, informed RN Jorene Guest

## 2016-07-13 NOTE — ED Notes (Addendum)
Condom Cath placed per [facility]. Assisted by Jenny Reichmann NT

## 2016-07-13 NOTE — Discharge Instructions (Signed)

## 2016-07-13 NOTE — H&P (Signed)
History and Physical    STUART MIRABILE NFA:[id number] DOB: [birth year] DOA: 07/13/2016  PCP: Beacher May, MD Patient coming from: independent living facility  Chief Complaint: sob  HPI: [name] is a very pleasant 80 y.o. male with medical history significant for CAD, heart failure, hypertension, diabetes, A. fib on anticoagulation with history of GI bleed, prostate cancer, history of stroke presents to the emergency department with chief complaint of shortness of breath. Initial evaluation reveals acute respiratory failure related to acute on chronic heart failure as well as acute on chronic kidney disease.  Information is obtained from the patient. He reports shortness of breath since yesterday afternoon. Reports gradual worsening in this timeframe but has experienced intermittent shortness of breath for 4 months. Associated symptoms include orthopnea, intermittent productive cough with white sputum. He denies any worsening lower extremity edema. He states he took some extra "steroids" with little improvement. He denies chest pain palpitations headache dizziness syncope or near-syncope. He denies any fever chills abdominal pain nausea vomiting dysuria hematuria frequency or urgency.    ED Course: He is provided with 40 mg Lasix IV as well as oxygen supplementation. He is afebrile hemodynamically stable with a blood pressure on the low end of normal and oxygen saturation level 94% on 2 L. He does not wear oxygen at home  Review of Systems: As per HPI otherwise 10 point review of systems negative.   Ambulatory Status: Ambulates independently with a steady gait  Past Medical History  Diagnosis Date  . Hyperlipidemia   . HTN (hypertension)     echo 2/11: EF 50-55%, diast dyfxn, severe LVH, inf HK, LAE  . CAD (coronary artery disease)     a.  NSTEMI treated with PCI Feb 2011 with a DES.([facility]);   b. cath 2/11: D1 stents x 2 ok, AV groove CFX occluded with dist AV CFX filled  by L-L collats, RCA occluded, OM2 90-95% (treated with PCI)  . MI (myocardial infarction) ([place])   . PVD (peripheral vascular disease) ([place])   . DM (diabetes mellitus) ([place])   . GERD (gastroesophageal reflux disease)   . Bipolar disorder ([place])   . Prostate cancer [facility])     status post radiation therapy in 2003  . Pulmonary edema   . Atrial fibrillation ([place])   . Respiratory difficulty 06/24/2014    intubated   . Hypertensive emergency 06/24/2014  . CKD (chronic kidney disease), stage III   . Anemia 11/28/2015  . Shortness of breath dyspnea   . Stroke ([place]) 07/2014  . Heart failure [facility])     Past Surgical History  Procedure Laterality Date  . Femoral bypass  2003  . Esophagogastroduodenoscopy N/A 11/29/2015    Procedure: ESOPHAGOGASTRODUODENOSCOPY (EGD);  Surgeon: Wonda Horner, MD;  Location: [facility] ENDOSCOPY;  Service: Endoscopy;  Laterality: N/A;  . Esophagogastroduodenoscopy (egd) with propofol Left 12/25/2015    Procedure: ESOPHAGOGASTRODUODENOSCOPY (EGD) WITH PROPOFOL;  Surgeon: Arta Silence, MD;  Location: [facility] ENDOSCOPY;  Service: Endoscopy;  Laterality: Left;  Freda Munro capsule study N/A 01/26/2016    Procedure: GIVENS CAPSULE STUDY;  Surgeon: Ronald Lobo, MD;  Location: [facility] ENDOSCOPY;  Service: Endoscopy;  Laterality: N/A;  . Colonoscopy N/A 01/25/2016    Procedure: COLONOSCOPY;  Surgeon: Ronald Lobo, MD;  Location: [facility] ENDOSCOPY;  Service: Endoscopy;  Laterality: N/A;  . Esophagogastroduodenoscopy N/A 05/13/2016    Procedure: ESOPHAGOGASTRODUODENOSCOPY (EGD);  Surgeon: Clarene Essex, MD;  Location: [facility] ENDOSCOPY;  Service: Endoscopy;  Laterality: N/A;  . Hot hemostasis N/A 05/13/2016  Procedure: HOT HEMOSTASIS (ARGON PLASMA COAGULATION/BICAP);  Surgeon: Clarene Essex, MD;  Location: [facility] ENDOSCOPY;  Service: Endoscopy;  Laterality: N/A;    Social History   Social History  . Marital Status: Divorced    Spouse Name: N/A  . Number of Children: N/A  . Years of Education: N/A    Occupational History  . retired Architect    Social History Main Topics  . Smoking status: Current Every Day Smoker -- 0.50 packs/day for 50 years    Types: Cigarettes  . Smokeless tobacco: Never Used     Comment: He has a 50-pack-year hx of tobacco abuse. Currently, smoking about half a pack a day.  . Alcohol Use: Yes     Comment: Previously drank heavily, and now has a drink every other day or so.  . Drug Use: Yes    Special: Marijuana, Cocaine  . Sexual Activity: Not on file   Other Topics Concern  . Not on file   Social History Narrative    Allergies  Allergen Reactions  . Penicillins Swelling    Has patient had a PCN reaction causing immediate rash, facial/tongue/throat swelling, SOB or lightheadedness with hypotension: Yes Has patient had a PCN reaction causing severe rash involving mucus membranes or skin necrosis: No Has patient had a PCN reaction that required hospitalization: No Has patient had a PCN reaction occurring within the last 10 years: No If all of the above answers are "NO", then may proceed with Cephalosporin use.    Family History  Problem Relation Age of Onset  . Cancer Mother   . Cancer Father   . Cancer Sister     Prior to Admission medications   Medication Sig Start Date End Date Taking? Authorizing Provider  acetaminophen (TYLENOL) 325 MG tablet Take 650 mg by mouth every 8 (eight) hours as needed for mild pain or fever (pain).    Yes Historical Provider, MD  albuterol (PROVENTIL HFA;VENTOLIN HFA) 108 (90 BASE) MCG/ACT inhaler Inhale 2 puffs into the lungs every 6 (six) hours as needed for shortness of breath.   Yes Historical Provider, MD  ammonium lactate (LAC-HYDRIN) 12 % lotion Apply 1 application topically daily.   Yes Historical Provider, MD  apixaban (ELIQUIS) 2.5 MG TABS tablet Take 2.5 mg by mouth every 12 (twelve) hours.   Yes Historical Provider, MD  atorvastatin (LIPITOR) 40 MG tablet Take 1 tablet (40 mg total) by mouth  daily. Patient taking differently: Take 20 mg by mouth at bedtime.  08/01/14  Yes Sherren Mocha, MD  cilostazol (PLETAL) 50 MG tablet Take 50 mg by mouth every 12 (twelve) hours.    Yes Historical Provider, MD  dextrose (GLUTOSE) 40 % GEL Take 1 Tube by mouth daily as needed for low blood sugar.    Yes Historical Provider, MD  feeding supplement, GLUCERNA SHAKE, (GLUCERNA SHAKE) LIQD Take 237 mLs by mouth 3 (three) times daily between meals.   Yes Historical Provider, MD  finasteride (PROSCAR) 5 MG tablet Take 5 mg by mouth daily.     Yes Historical Provider, MD  furosemide (LASIX) 40 MG tablet Take 40 mg by mouth daily.   Yes Historical Provider, MD  gabapentin (NEURONTIN) 300 MG capsule Take 300 mg by mouth 2 (two) times daily.    Yes Historical Provider, MD  guaifenesin (HUMIBID E) 400 MG TABS tablet Take 400 mg by mouth 3 (three) times daily as needed. For congestion   Yes Historical Provider, MD  insulin glargine (LANTUS) 100 UNIT/ML  injection Inject 40 Units into the skin daily.   Yes Historical Provider, MD  insulin regular (NOVOLIN R,HUMULIN R) 100 units/mL injection Inject 5 Units into the skin See admin instructions. Inject 5 units under the skin before breakfast and inject 5 units before evening meal   Yes Historical Provider, MD  metoprolol (LOPRESSOR) 50 MG tablet Take 50 mg by mouth 2 (two) times daily.   Yes Historical Provider, MD  Multiple Vitamins-Minerals (MULTIVITAMIN WITH MINERALS) tablet Take 1 tablet by mouth daily.   Yes Historical Provider, MD  nystatin (MYCOSTATIN) 100000 UNIT/ML suspension Take 5 mLs (500,000 Units total) by mouth 4 (four) times daily. 11/29/15  Yes Alexa Angela Burke, MD  pantoprazole (PROTONIX) 40 MG tablet Take 1 tablet (40 mg total) by mouth daily. 11/29/15  Yes Alexa Angela Burke, MD  simethicone (MYLICON) 80 MG chewable tablet Chew 80 mg by mouth 3 (three) times daily as needed for flatulence. Take with meals as needed   Yes Historical Provider, MD  Tamsulosin HCl  (FLOMAX) 0.4 MG CAPS Take 0.4 mg by mouth daily.     Yes Historical Provider, MD  vitamin B-12 (CYANOCOBALAMIN) 1000 MCG tablet Take 1,000 mcg by mouth daily.   Yes Historical Provider, MD  apixaban (ELIQUIS) 2.5 MG TABS tablet Take 1 tablet (2.5 mg total) by mouth 2 (two) times daily. Patient not taking: Reported on 05/11/2016 01/27/16   Jule Ser, DO  diclofenac sodium (VOLTAREN) 1 % GEL Apply 2 g topically 4 (four) times daily. Patient not taking: Reported on 05/11/2016 11/29/15   Florinda Marker, MD    Physical Exam: Filed Vitals:   07/13/16 0830 07/13/16 0845 07/13/16 0900 07/13/16 0915  BP: 126/61  100/60 122/73  Pulse: 74   85  Temp:      TempSrc:      Resp: 19   20  Height:      Weight:  88.1 kg (194 lb 3.6 oz)    SpO2:   95% 93%     General:  Appears calm and comfortable, sitting up in bed eating breakfast Eyes:  PERRL, EOMI, normal lids, iris ENT:  grossly normal hearing, lips & tongue, mucous membranes of his mouth are moist and pink Neck:  +JVD, full ROM  Cardiovascular:  Irregularly irregular, no m/r/g. Trace LE edema.  Respiratory:  Mild increased work of breathing with conversation and eating. Breath sounds somewhat diminished throughout bilateral basilar Rales no wheeze Abdomen:  soft, ntnd, NABS Skin:  no rash or induration seen on limited exam Musculoskeletal:  grossly normal tone BUE/BLE, good ROM, no bony abnormality Psychiatric:  grossly normal mood and affect, speech fluent and appropriate, AOx3 Neurologic:  CN 2-12 grossly intact, moves all extremities in coordinated fashion, sensation intact  Labs on Admission: I have personally reviewed following labs and imaging studies  CBC:  Recent Labs Lab 07/08/16 1412 07/09/16 0451 07/13/16 0550  WBC 4.8 5.2 5.7  NEUTROABS 2.7  --  3.5  HGB 11.4* 10.9* 11.5*  HCT 36.3* 35.5* 37.0*  MCV 84.6 84.7 84.7  PLT 235 250 885   Basic Metabolic Panel:  Recent Labs Lab 07/08/16 1412 07/09/16 0451  07/13/16 0550  NA 139 136 137  K 3.5 4.0 4.1  CL 103 99* 102  CO2 '28 29 27  '$ GLUCOSE 155* 188* 192*  BUN 14 15 36*  CREATININE 1.61* 1.59* 2.67*  CALCIUM 8.9 8.4* 8.8*  MG  --  0.9*  --   PHOS  --  3.5  --  GFR: Estimated Creatinine Clearance: 25.4 mL/min (by C-G formula based on Cr of 2.67). Liver Function Tests:  Recent Labs Lab 07/09/16 0451  AST 17  ALT 9*  ALKPHOS 38  BILITOT 0.8  PROT 6.8  ALBUMIN 2.5*   No results for input(s): LIPASE, AMYLASE in the last 168 hours. No results for input(s): AMMONIA in the last 168 hours. Coagulation Profile:  Recent Labs Lab 07/13/16 0550  INR 1.60*   Cardiac Enzymes: No results for input(s): CKTOTAL, CKMB, CKMBINDEX, TROPONINI in the last 168 hours. BNP (last 3 results) No results for input(s): PROBNP in the last 8760 hours. HbA1C: No results for input(s): HGBA1C in the last 72 hours. CBG:  Recent Labs Lab 07/09/16 0019 07/09/16 0132 07/09/16 0443 07/13/16 0549 07/13/16 0941  GLUCAP 126* 140* 202* 213* 206*   Lipid Profile: No results for input(s): CHOL, HDL, LDLCALC, TRIG, CHOLHDL, LDLDIRECT in the last 72 hours. Thyroid Function Tests: No results for input(s): TSH, T4TOTAL, FREET4, T3FREE, THYROIDAB in the last 72 hours. Anemia Panel: No results for input(s): VITAMINB12, FOLATE, FERRITIN, TIBC, IRON, RETICCTPCT in the last 72 hours. Urine analysis:    Component Value Date/Time   COLORURINE YELLOW 08/25/2014 1602   APPEARANCEUR CLEAR 08/25/2014 1602   LABSPEC 1.008 08/25/2014 1602   PHURINE 6.5 08/25/2014 1602   GLUCOSEU NEGATIVE 08/25/2014 1602   HGBUR TRACE* 08/25/2014 1602   BILIRUBINUR NEGATIVE 08/25/2014 1602   KETONESUR NEGATIVE 08/25/2014 1602   PROTEINUR NEGATIVE 08/25/2014 1602   UROBILINOGEN 1.0 08/25/2014 1602   NITRITE NEGATIVE 08/25/2014 1602   LEUKOCYTESUR NEGATIVE 08/25/2014 1602    Creatinine Clearance: Estimated Creatinine Clearance: 25.4 mL/min (by C-G formula based on Cr of  2.67).  Sepsis Labs: '@LABRCNTIP'$ (procalcitonin:4,lacticidven:4) ) Recent Results (from the past 240 hour(s))  MRSA PCR Screening     Status: None   Collection Time: 07/09/16  4:45 AM  Result Value Ref Range Status   MRSA by PCR NEGATIVE NEGATIVE Final    Comment:        The GeneXpert MRSA Assay (FDA approved for NASAL specimens only), is one component of a comprehensive MRSA colonization surveillance program. It is not intended to diagnose MRSA infection nor to guide or monitor treatment for MRSA infections.      Radiological Exams on Admission: Dg Chest 2 View  07/13/2016  CLINICAL DATA:  Shortness of breath and cough. EXAM: CHEST  2 VIEW COMPARISON:  05/11/2016 FINDINGS: Chronic cardiopericardial enlargement. Small pleural effusions, fissural thickening, interstitial opacity. Patchy bilateral lung opacification, greater on the right. Stable aortic contours with atherosclerotic calcification. IMPRESSION: 1. CHF. 2. Asymmetric right-sided alveolar edema versus superimposed pneumonia. Electronically Signed   By: Monte Fantasia M.D.   On: 07/13/2016 06:18    EKG: Independently reviewed. Atrial fibrillation Right bundle branch block Prolonged QT No significant change since last tracing  Assessment/Plan Principal Problem:   Acute respiratory failure (HCC) Active Problems:   DM (diabetes mellitus), type 2 with renal complications (HCC)   Essential hypertension   CAD- last PCI 2011   GERD   Chronic atrial fibrillation (HCC)   Acute on chronic diastolic congestive heart failure (HCC)   Anemia   Chronic anticoagulation-Eliquis   Acute on chronic kidney failure ([place])   Acute on chronic heart failure ([place])   #1. Acute respiratory failure likely related to acute on chronic heart failure. Increased oxygen demand on presentation. He does not wear oxygen at home. Oxygen saturation level increased to 94% on 2 L. BNP elevated. Chest x-ray consistent  with CHF. Echo done in May of this  year reveals an EF 30-35%. He is provided with IV Lasix 40 mg in the emergency department -Admit to telemetry -Continue oxygen supplementation -IV Lasix -Monitor urine output -Obtain daily weights  #2. Acute on chronic diastolic heart failure. Etiology unclear. Echo done in May of this year reveals an EF of 30-35% Chest x-ray consistent with CHF, elevated BNP. JVD on exam. Home medications include Lasix, metoprolol. Weight on admissions 88.1 kg -IV Lasix -Monitor urine output -Daily weights -If no improvement 24 hours consider cardiology consult  #3. Acute on chronic kidney failure. Creatinine 2.67 on admission. Creatinine 5 days ago 1.61. Likely related to above -Hold nephrotoxins as able -Monitor urine output -Bemet in the morning -If no improvement consider a renal ultrasound  #4. Hypertension. Home medications include beta blocker and Lasix. Blood pressure, low end of normal on admission. - Hold antihypertensive medications for now -Monitor closely -Resume  #5. Chronic A. fib. Rate controlled. chadvasc score 8. He was on Eliquis at one time but this was stopped due to GI bleed. Chart review indicates evaluation in May of this year with GI and cardiology with decision to resume with close outpatient follow-up. Hemoglobin 11.5 on admission -Continue eliquis  #6. CAD. Denies chest pain. EKG without acute changes. Chart review indicates STEMI 2011 and May of this year. -Monitor on telemetry -Continue home meds  #.7 history gi bleed related to AVMs in setting of Eliquis. Hg. Stable. No s/sx bleeding. Of note Eliquis dose decreased -monitor  8. Diabetes. Glucose 192 on admission. Home medications include Lantus 40 units daily. Of note he had a recent admission for presumed accidental insulin overdose and hypoglycemia. Of note he left AMA after admission.  -Continue Lantus at a lower dose -Sliding scale insulin     DVT prophylaxis: eliquis  Code Status: full  Family  Communication: none  Disposition Plan: back to independent living  Consults called: none Admission status: obs    Radene Gunning MD [facility]  If 7PM-7AM, please contact night-coverage [email or web address] Password [facility]  07/13/2016, 9:47 AM

## 2016-07-13 NOTE — ED Provider Notes (Signed)
complains of shortness of breath progressively worsened since yesterday. Shortness of breath is worse with lying supine. No treatment prior to coming here. On exam alert speaks in paragraphs H ENT exam no facial asymmetry neck supple positive JVD lungs Rales right base bilateral lower extremities with 2+ pretibial pitting edema. Patient felt to be in acute congestive heart failure. Also with acute kidney injury. Hospitalist service consulted. I spoke with Ms. Renard Hamper, NP who will evaluate patient in ED and make arrangements for inpatient stay. Intravenous Lasix ordered Chest x-ray viewed by me Results for orders placed or performed during the hospital encounter of 07/13/16  CBC with Differential/Platelet  Result Value Ref Range   WBC 5.7 4.0 - 10.5 K/uL   RBC 4.37 4.22 - 5.81 MIL/uL   Hemoglobin 11.5 (L) 13.0 - 17.0 g/dL   HCT 37.0 (L) 39.0 - 52.0 %   MCV 84.7 78.0 - 100.0 fL   MCH 26.3 26.0 - 34.0 pg   MCHC 31.1 30.0 - 36.0 g/dL   RDW 17.4 (H) 11.5 - 15.5 %   Platelets 241 150 - 400 K/uL   Neutrophils Relative % 62 %   Neutro Abs 3.5 1.7 - 7.7 K/uL   Lymphocytes Relative 22 %   Lymphs Abs 1.2 0.7 - 4.0 K/uL   Monocytes Relative 12 %   Monocytes Absolute 0.7 0.1 - 1.0 K/uL   Eosinophils Relative 3 %   Eosinophils Absolute 0.2 0.0 - 0.7 K/uL   Basophils Relative 1 %   Basophils Absolute 0.1 0.0 - 0.1 K/uL  Brain natriuretic peptide  Result Value Ref Range   B Natriuretic Peptide 1283.3 (H) 0.0 - 100.0 pg/mL  Basic metabolic panel  Result Value Ref Range   Sodium 137 135 - 145 mmol/L   Potassium 4.1 3.5 - 5.1 mmol/L   Chloride 102 101 - 111 mmol/L   CO2 27 22 - 32 mmol/L   Glucose, Bld 192 (H) 65 - 99 mg/dL   BUN 36 (H) 6 - 20 mg/dL   Creatinine, Ser 2.67 (H) 0.61 - 1.24 mg/dL   Calcium 8.8 (L) 8.9 - 10.3 mg/dL   GFR calc non Af Amer 21 (L) >60 mL/min   GFR calc Af Amer 25 (L) >60 mL/min   Anion gap 8 5 - 15  Protime-INR  Result Value Ref Range   Prothrombin Time 19.1 (H) 11.6  - 15.2 seconds   INR 1.60 (H) 0.00 - 1.49  CBG monitoring, ED  Result Value Ref Range   Glucose-Capillary 213 (H) 65 - 99 mg/dL   Comment 1 Notify RN    Dg Chest 2 View  07/13/2016  CLINICAL DATA:  Shortness of breath and cough. EXAM: CHEST  2 VIEW COMPARISON:  05/11/2016 FINDINGS: Chronic cardiopericardial enlargement. Small pleural effusions, fissural thickening, interstitial opacity. Patchy bilateral lung opacification, greater on the right. Stable aortic contours with atherosclerotic calcification. IMPRESSION: 1. CHF. 2. Asymmetric right-sided alveolar edema versus superimposed pneumonia. Electronically Signed   By: Monte Fantasia M.D.   On: 07/13/2016 06:18     Orlie Dakin, MD 07/13/16 [phone]

## 2016-07-13 NOTE — ED Provider Notes (Signed)
CSN: [id number]     Arrival date & time 07/13/16  0524 History   First MD Initiated Contact with Patient 07/13/16 [phone]     Chief Complaint  Patient presents with  . Shortness of Breath     (Consider location/radiation/quality/duration/timing/severity/associated sxs/prior Treatment) HPI   Patient is a 80 year old male with a history of HTN, CAD, MI, DM, CHF, stroke who presents the ED with shortness of breath since last evening. Patient states intermittent shortness of breath for 4 months with associated productive cough. Patient states he got a cold for months ago and the cough remained. Episodes of shortness of breath are worse with prone position, better with sitting up or lying on back. Patient denies other symptoms. Patient does not weigh himself daily. He isn't sure if he has had weight gain. Patient was here 5 days ago, admitted for obs after taking too much insulin. Patient denies fever, chest pain, abdominal pain, nausea, vomiting, changes in bowel or bladder habits, hematochezia, dizziness, headache.  Past Medical History  Diagnosis Date  . Hyperlipidemia   . HTN (hypertension)     echo 2/11: EF 50-55%, diast dyfxn, severe LVH, inf HK, LAE  . CAD (coronary artery disease)     a.  NSTEMI treated with PCI Feb 2011 with a DES.([facility]);   b. cath 2/11: D1 stents x 2 ok, AV groove CFX occluded with dist AV CFX filled by L-L collats, RCA occluded, OM2 90-95% (treated with PCI)  . MI (myocardial infarction) ([place])   . PVD (peripheral vascular disease) ([place])   . DM (diabetes mellitus) ([place])   . GERD (gastroesophageal reflux disease)   . Bipolar disorder ([place])   . Prostate cancer [facility])     status post radiation therapy in 2003  . Pulmonary edema   . Atrial fibrillation ([place])   . Respiratory difficulty 06/24/2014    intubated   . Hypertensive emergency 06/24/2014  . CKD (chronic kidney disease), stage III   . Anemia 11/28/2015  . Shortness of breath dyspnea   . Stroke ([place]) 07/2014   . Heart failure [facility])    Past Surgical History  Procedure Laterality Date  . Femoral bypass  2003  . Esophagogastroduodenoscopy N/A 11/29/2015    Procedure: ESOPHAGOGASTRODUODENOSCOPY (EGD);  Surgeon: Wonda Horner, MD;  Location: [facility] ENDOSCOPY;  Service: Endoscopy;  Laterality: N/A;  . Esophagogastroduodenoscopy (egd) with propofol Left 12/25/2015    Procedure: ESOPHAGOGASTRODUODENOSCOPY (EGD) WITH PROPOFOL;  Surgeon: Arta Silence, MD;  Location: [facility] ENDOSCOPY;  Service: Endoscopy;  Laterality: Left;  Freda Munro capsule study N/A 01/26/2016    Procedure: GIVENS CAPSULE STUDY;  Surgeon: Ronald Lobo, MD;  Location: [facility] ENDOSCOPY;  Service: Endoscopy;  Laterality: N/A;  . Colonoscopy N/A 01/25/2016    Procedure: COLONOSCOPY;  Surgeon: Ronald Lobo, MD;  Location: [facility] ENDOSCOPY;  Service: Endoscopy;  Laterality: N/A;  . Esophagogastroduodenoscopy N/A 05/13/2016    Procedure: ESOPHAGOGASTRODUODENOSCOPY (EGD);  Surgeon: Clarene Essex, MD;  Location: [facility] ENDOSCOPY;  Service: Endoscopy;  Laterality: N/A;  . Hot hemostasis N/A 05/13/2016    Procedure: HOT HEMOSTASIS (ARGON PLASMA COAGULATION/BICAP);  Surgeon: Clarene Essex, MD;  Location: [facility] ENDOSCOPY;  Service: Endoscopy;  Laterality: N/A;   Family History  Problem Relation Age of Onset  . Cancer Mother   . Cancer Father   . Cancer Sister    Social History  Substance Use Topics  . Smoking status: Current Every Day Smoker -- 0.50 packs/day for 50 years    Types: Cigarettes  . Smokeless tobacco: Never Used  Comment: He has a 50-pack-year hx of tobacco abuse. Currently, smoking about half a pack a day.  . Alcohol Use: Yes     Comment: Previously drank heavily, and now has a drink every other day or so.    Review of Systems  Constitutional: Negative for fever and chills.  HENT: Negative for sore throat and trouble swallowing.   Eyes: Negative for visual disturbance.  Respiratory: Positive for cough and shortness of breath. Negative for chest  tightness.   Cardiovascular: Positive for leg swelling. Negative for chest pain.  Gastrointestinal: Negative for nausea, vomiting, abdominal pain, diarrhea and blood in stool.  Genitourinary: Negative for dysuria and hematuria.  Skin: Negative for rash.  Neurological: Negative for dizziness, weakness and headaches.  Psychiatric/Behavioral: Negative for confusion.      Allergies  Penicillins  Home Medications   Prior to Admission medications   Medication Sig Start Date End Date Taking? Authorizing Provider  acetaminophen (TYLENOL) 325 MG tablet Take 650 mg by mouth every 8 (eight) hours as needed for mild pain or fever (pain).    Yes Historical Provider, MD  albuterol (PROVENTIL HFA;VENTOLIN HFA) 108 (90 BASE) MCG/ACT inhaler Inhale 2 puffs into the lungs every 6 (six) hours as needed for shortness of breath.   Yes Historical Provider, MD  ammonium lactate (LAC-HYDRIN) 12 % lotion Apply 1 application topically daily.   Yes Historical Provider, MD  apixaban (ELIQUIS) 2.5 MG TABS tablet Take 2.5 mg by mouth every 12 (twelve) hours.   Yes Historical Provider, MD  atorvastatin (LIPITOR) 40 MG tablet Take 1 tablet (40 mg total) by mouth daily. Patient taking differently: Take 20 mg by mouth at bedtime.  08/01/14  Yes Sherren Mocha, MD  cilostazol (PLETAL) 50 MG tablet Take 50 mg by mouth every 12 (twelve) hours.    Yes Historical Provider, MD  dextrose (GLUTOSE) 40 % GEL Take 1 Tube by mouth daily as needed for low blood sugar.    Yes Historical Provider, MD  feeding supplement, GLUCERNA SHAKE, (GLUCERNA SHAKE) LIQD Take 237 mLs by mouth 3 (three) times daily between meals.   Yes Historical Provider, MD  finasteride (PROSCAR) 5 MG tablet Take 5 mg by mouth daily.     Yes Historical Provider, MD  furosemide (LASIX) 40 MG tablet Take 40 mg by mouth daily.   Yes Historical Provider, MD  gabapentin (NEURONTIN) 300 MG capsule Take 300 mg by mouth 2 (two) times daily.    Yes Historical Provider, MD   guaifenesin (HUMIBID E) 400 MG TABS tablet Take 400 mg by mouth 3 (three) times daily as needed. For congestion   Yes Historical Provider, MD  insulin glargine (LANTUS) 100 UNIT/ML injection Inject 40 Units into the skin daily.   Yes Historical Provider, MD  insulin regular (NOVOLIN R,HUMULIN R) 100 units/mL injection Inject 5 Units into the skin See admin instructions. Inject 5 units under the skin before breakfast and inject 5 units before evening meal   Yes Historical Provider, MD  metoprolol (LOPRESSOR) 50 MG tablet Take 50 mg by mouth 2 (two) times daily.   Yes Historical Provider, MD  Multiple Vitamins-Minerals (MULTIVITAMIN WITH MINERALS) tablet Take 1 tablet by mouth daily.   Yes Historical Provider, MD  nystatin (MYCOSTATIN) 100000 UNIT/ML suspension Take 5 mLs (500,000 Units total) by mouth 4 (four) times daily. 11/29/15  Yes Alexa Angela Burke, MD  pantoprazole (PROTONIX) 40 MG tablet Take 1 tablet (40 mg total) by mouth daily. 11/29/15  Yes Florinda Marker, MD  simethicone (MYLICON) 80 MG chewable tablet Chew 80 mg by mouth 3 (three) times daily as needed for flatulence. Take with meals as needed   Yes Historical Provider, MD  Tamsulosin HCl (FLOMAX) 0.4 MG CAPS Take 0.4 mg by mouth daily.     Yes Historical Provider, MD  vitamin B-12 (CYANOCOBALAMIN) 1000 MCG tablet Take 1,000 mcg by mouth daily.   Yes Historical Provider, MD  apixaban (ELIQUIS) 2.5 MG TABS tablet Take 1 tablet (2.5 mg total) by mouth 2 (two) times daily. Patient not taking: Reported on 05/11/2016 01/27/16   Jule Ser, DO  diclofenac sodium (VOLTAREN) 1 % GEL Apply 2 g topically 4 (four) times daily. Patient not taking: Reported on 05/11/2016 11/29/15   Alexa Angela Burke, MD   BP 109/65 mmHg  Pulse 87  Temp(Src) 97.8 F (36.6 C) (Oral)  Resp 23  Ht 6' (1.829 m)  Wt 88.905 kg  BMI 26.58 kg/m2  SpO2 93% Physical Exam  Constitutional: He appears well-developed and well-nourished. No distress.  HENT:  Head: Normocephalic  and atraumatic.  Eyes: Conjunctivae are normal.  Neck: Trachea normal. JVD present.  Cardiovascular: Normal rate and normal heart sounds.  Exam reveals no gallop and no friction rub.   No murmur heard. Pulses:      Dorsalis pedis pulses are 1+ on the right side, and 1+ on the left side.  Pulmonary/Chest: Effort normal. No respiratory distress. He has decreased breath sounds in the right middle field and the right lower field. He has no wheezes. He has no rhonchi. He has rales in the right lower field.  Abdominal: Soft. He exhibits no distension. There is no tenderness. There is no rebound and no guarding.  Musculoskeletal: Normal range of motion. He exhibits edema.  2+ pitting bilateral lower extremity edema, left > right  Neurological: He is alert. Coordination normal.  Skin: Skin is warm and dry. He is not diaphoretic.  Psychiatric: He has a normal mood and affect. His behavior is normal.  Nursing note and vitals reviewed.   ED Course  Procedures (including critical care time) Labs Review Labs Reviewed  CBC WITH DIFFERENTIAL/PLATELET - Abnormal; Notable for the following:    Hemoglobin 11.5 (*)    HCT 37.0 (*)    RDW 17.4 (*)    All other components within normal limits  BRAIN NATRIURETIC PEPTIDE - Abnormal; Notable for the following:    B Natriuretic Peptide 1283.3 (*)    All other components within normal limits  BASIC METABOLIC PANEL - Abnormal; Notable for the following:    Glucose, Bld 192 (*)    BUN 36 (*)    Creatinine, Ser 2.67 (*)    Calcium 8.8 (*)    GFR calc non Af Amer 21 (*)    GFR calc Af Amer 25 (*)    All other components within normal limits  PROTIME-INR - Abnormal; Notable for the following:    Prothrombin Time 19.1 (*)    INR 1.60 (*)    All other components within normal limits  CBG MONITORING, ED - Abnormal; Notable for the following:    Glucose-Capillary 213 (*)    All other components within normal limits  CBG MONITORING, ED - Abnormal; Notable  for the following:    Glucose-Capillary 206 (*)    All other components within normal limits  HEMOGLOBIN A1C    Imaging Review Dg Chest 2 View  07/13/2016  CLINICAL DATA:  Shortness of breath and cough. EXAM: CHEST  2 VIEW COMPARISON:  05/11/2016 FINDINGS: Chronic cardiopericardial enlargement. Small pleural effusions, fissural thickening, interstitial opacity. Patchy bilateral lung opacification, greater on the right. Stable aortic contours with atherosclerotic calcification. IMPRESSION: 1. CHF. 2. Asymmetric right-sided alveolar edema versus superimposed pneumonia. Electronically Signed   By: Monte Fantasia M.D.   On: 07/13/2016 06:18   I have personally reviewed and evaluated these images and lab results as part of my medical decision-making.   EKG Interpretation   Date/Time:  Monday July 13 2016 05:44:36 EDT Ventricular Rate:  77 PR Interval:    QRS Duration: 156 QT Interval:  455 QTC Calculation: 515 R Axis:   168 Text Interpretation:  Atrial fibrillation Right bundle branch block  Prolonged QT No significant change since last tracing Confirmed by Glynn Octave [phone]) on 07/13/2016 6:49:53 AM      MDM   Final diagnoses:  Acute on chronic systolic congestive heart failure ([place])   Patient presents with shortness of breath. Patient with neck vein distention, rales in the right base, right-sided pulmonary edema. This is likely a CHF exacerbation. Patient was here 5 days ago for hypoglycemia because he took too much insulin. At that time he was likely given fluids and this could be the etiology of CHF exacerbation. We'll consult hospice team to admit the patient for further evaluation and treatment. Patient given 40 mg IV Lasix while in the ED.  Pt will be admitted by the hospitalist team for further evaluation and treatment. Thank you for your time and care of this pt. Dr. Winfred Leeds spoke with Ms. Renard Hamper, NP who will admit the pt.   Patient case discussed and patient  seen by Dr. Winfred Leeds who agrees with the above plan.  Kalman Drape, [place] 07/13/16 [place], MD 07/13/16 1730

## 2016-07-14 DIAGNOSIS — I5033 Acute on chronic diastolic (congestive) heart failure: Secondary | ICD-10-CM | POA: Diagnosis not present

## 2016-07-14 DIAGNOSIS — I252 Old myocardial infarction: Secondary | ICD-10-CM | POA: Diagnosis not present

## 2016-07-14 DIAGNOSIS — Z794 Long term (current) use of insulin: Secondary | ICD-10-CM | POA: Diagnosis not present

## 2016-07-14 DIAGNOSIS — I2510 Atherosclerotic heart disease of native coronary artery without angina pectoris: Secondary | ICD-10-CM | POA: Diagnosis present

## 2016-07-14 DIAGNOSIS — I161 Hypertensive emergency: Secondary | ICD-10-CM | POA: Diagnosis present

## 2016-07-14 DIAGNOSIS — I5043 Acute on chronic combined systolic (congestive) and diastolic (congestive) heart failure: Secondary | ICD-10-CM | POA: Diagnosis present

## 2016-07-14 DIAGNOSIS — Z8546 Personal history of malignant neoplasm of prostate: Secondary | ICD-10-CM | POA: Diagnosis not present

## 2016-07-14 DIAGNOSIS — F319 Bipolar disorder, unspecified: Secondary | ICD-10-CM | POA: Diagnosis present

## 2016-07-14 DIAGNOSIS — I482 Chronic atrial fibrillation: Secondary | ICD-10-CM | POA: Diagnosis present

## 2016-07-14 DIAGNOSIS — Z923 Personal history of irradiation: Secondary | ICD-10-CM | POA: Diagnosis not present

## 2016-07-14 DIAGNOSIS — Z7901 Long term (current) use of anticoagulants: Secondary | ICD-10-CM | POA: Diagnosis not present

## 2016-07-14 DIAGNOSIS — E1122 Type 2 diabetes mellitus with diabetic chronic kidney disease: Secondary | ICD-10-CM | POA: Diagnosis present

## 2016-07-14 DIAGNOSIS — Z8673 Personal history of transient ischemic attack (TIA), and cerebral infarction without residual deficits: Secondary | ICD-10-CM | POA: Diagnosis not present

## 2016-07-14 DIAGNOSIS — J9600 Acute respiratory failure, unspecified whether with hypoxia or hypercapnia: Secondary | ICD-10-CM | POA: Diagnosis present

## 2016-07-14 DIAGNOSIS — E785 Hyperlipidemia, unspecified: Secondary | ICD-10-CM | POA: Diagnosis present

## 2016-07-14 DIAGNOSIS — N189 Chronic kidney disease, unspecified: Secondary | ICD-10-CM | POA: Diagnosis not present

## 2016-07-14 DIAGNOSIS — N179 Acute kidney failure, unspecified: Secondary | ICD-10-CM | POA: Diagnosis not present

## 2016-07-14 DIAGNOSIS — I130 Hypertensive heart and chronic kidney disease with heart failure and stage 1 through stage 4 chronic kidney disease, or unspecified chronic kidney disease: Secondary | ICD-10-CM | POA: Diagnosis present

## 2016-07-14 DIAGNOSIS — R0602 Shortness of breath: Secondary | ICD-10-CM | POA: Diagnosis present

## 2016-07-14 DIAGNOSIS — J9601 Acute respiratory failure with hypoxia: Secondary | ICD-10-CM | POA: Diagnosis not present

## 2016-07-14 DIAGNOSIS — N183 Chronic kidney disease, stage 3 (moderate): Secondary | ICD-10-CM | POA: Diagnosis present

## 2016-07-14 DIAGNOSIS — D649 Anemia, unspecified: Secondary | ICD-10-CM | POA: Diagnosis present

## 2016-07-14 DIAGNOSIS — K219 Gastro-esophageal reflux disease without esophagitis: Secondary | ICD-10-CM | POA: Diagnosis present

## 2016-07-14 DIAGNOSIS — F419 Anxiety disorder, unspecified: Secondary | ICD-10-CM | POA: Diagnosis present

## 2016-07-14 DIAGNOSIS — E1151 Type 2 diabetes mellitus with diabetic peripheral angiopathy without gangrene: Secondary | ICD-10-CM | POA: Diagnosis present

## 2016-07-14 DIAGNOSIS — Z79899 Other long term (current) drug therapy: Secondary | ICD-10-CM | POA: Diagnosis not present

## 2016-07-14 DIAGNOSIS — Z880 Allergy status to penicillin: Secondary | ICD-10-CM | POA: Diagnosis not present

## 2016-07-14 DIAGNOSIS — I4510 Unspecified right bundle-branch block: Secondary | ICD-10-CM | POA: Diagnosis present

## 2016-07-14 LAB — GLUCOSE, CAPILLARY
GLUCOSE-CAPILLARY: 126 mg/dL — AB (ref 65–99)
GLUCOSE-CAPILLARY: 108 mg/dL — AB (ref 65–99)
GLUCOSE-CAPILLARY: 272 mg/dL — AB (ref 65–99)

## 2016-07-14 LAB — HEMOGLOBIN A1C
Hgb A1c MFr Bld: 8.9 % — ABNORMAL HIGH (ref 4.8–5.6)
MEAN PLASMA GLUCOSE: 209 mg/dL

## 2016-07-14 LAB — BASIC METABOLIC PANEL
ANION GAP: 8 (ref 5–15)
BUN: 37 mg/dL — AB (ref 6–20)
CO2: 27 mmol/L (ref 22–32)
Calcium: 8.8 mg/dL — ABNORMAL LOW (ref 8.9–10.3)
Chloride: 102 mmol/L (ref 101–111)
Creatinine, Ser: 2.33 mg/dL — ABNORMAL HIGH (ref 0.61–1.24)
GFR, EST AFRICAN AMERICAN: 29 mL/min — AB (ref >60)
GFR, EST NON AFRICAN AMERICAN: 25 mL/min — AB (ref >60)
Glucose, Bld: 185 mg/dL — ABNORMAL HIGH (ref 65–99)
POTASSIUM: 4 mmol/L (ref 3.5–5.1)
SODIUM: 137 mmol/L (ref 135–145)

## 2016-07-14 MED ORDER — LEVOFLOXACIN IN D5W 500 MG/100ML IV SOLN
500.0000 mg | INTRAVENOUS | Status: DC
  Administered 2016-07-14 – 2016-07-16 (×2): 500 mg via INTRAVENOUS
  Filled 2016-07-14 (×3): qty 100

## 2016-07-14 NOTE — Progress Notes (Signed)
Inpatient Diabetes Program Recommendations  AACE/ADA: New Consensus Statement on Inpatient Glycemic Control (2015)  Target Ranges:  Prepandial:   less than 140 mg/dL      Peak postprandial:   less than 180 mg/dL (1-2 hours)      Critically ill patients:  140 - 180 mg/dL  Results for JAKHARI, SPACE (MRN [id number]) as of 07/14/2016 09:44  Ref. Range 07/13/2016 09:41 07/13/2016 11:40 07/13/2016 17:25 07/13/2016 23:24 07/14/2016 06:21  Glucose-Capillary Latest Ref Range: 65-99 mg/dL 206 (H) 225 (H) 226 (H) 195 (H) 126 (H)   Review of Glycemic Control  Diabetes history: DM Type 2 Outpatient Diabetes medications: Lantus 40 QHS, Regular 5 with breakfast and supper Current orders for Inpatient glycemic control: Lantus 20 units q hs + Novolog correction 0-9 units tid with meals  Inpatient Diabetes Program Recommendations:  Noted CBGs elevated post prandial. Please consider adding Novolog 2-3 units tid with meals if eats > 50%.  Thank you, Nani Gasser. Lynsey Ange, RN, MSN, CDE Inpatient Glycemic Control Team Team Pager [phone] (8am-5pm) 07/14/2016 9:43 AM

## 2016-07-14 NOTE — Care Management Obs Status (Signed)
[place] NOTIFICATION   Patient Details  Name: [name] MRN: [id number] Date of Birth: 09-29-1936   Medicare Observation Status Notification Given:  Yes    Erenest Rasher, RN 07/14/2016, 5:05 PM

## 2016-07-14 NOTE — Progress Notes (Addendum)
PROGRESS NOTE  [name] YBO:[id number] DOB: Dec 11, 1936 DOA: 07/13/2016 PCP: Beacher May, MD     Brief Narrative: 80 y.o. male with a Past Medical History of OD, HTN, CAD, MI, PVD, DM, GERD, bipolar disorder, prostate cancer, A. fib, C KD, CVA, CHF who presents with acute respiratory failure likely secondary to CHF decompensation.   Assessment & Plan: Principal Problem:   Acute respiratory failure (HCC) Active Problems:   DM (diabetes mellitus), type 2 with renal complications (HCC)   Essential hypertension   CAD- last PCI 2011   GERD   Chronic atrial fibrillation (HCC)   Acute on chronic diastolic congestive heart failure (HCC)   Anemia   Chronic anticoagulation-Eliquis   Acute on chronic kidney failure (HCC)   Acute on chronic heart failure (HCC)    Acute respiratory failure likely related to acute on chronic heart failure and possible HCAP - Increased oxygen demand on presentation. He does not wear oxygen at home.  - wean off as tolerated  Acute on chronic combined systolic and diastolic heart failure - most recent 2D echo 05/12/16 with EF 25-30% - IV Lasix - improving today  Possible HCAP - patient complains of a "cold" with "chest congestion" in the past 2 weeks, subjective fevers and a productive cough - afebrile, no WBC, but symptomatic. WIll cover empirically with Levaquin for 5 days  Acute on chronic kidney failure - Creatinine 2.67 on admission. Creatinine 5 days ago 1.61. Likely related to above, improved with diuresis, continue to monitor - Hold nephrotoxins as able  Hypertension - Home medications include beta blocker and Lasix. Blood pressure, low end of normal on admission. - Hold antihypertensive medications for now still while IV diuresing  Chronic A. fib - Rate controlled. chadvasc score 8. He was on Eliquis at one time but this was stopped due to GI bleed. Chart review indicates evaluation in May of this year with GI and cardiology with  decision to resume with close outpatient follow-up. Hemoglobin 11.5 on admission - Continue eliquis - clinically no bleeding  CAD - Denies chest pain. EKG without acute changes. Chart review indicates STEMI 2011 and May of this year. - Monitor on telemetry - Continue home meds  History gi bleed related to AVMs in setting of Eliquis.  - Hg. Stable. No s/sx bleeding. Of note Eliquis dose decreased - monitor  Diabetes - Continue Lantus at a lower dose - Sliding scale insulin  Bipolar disorder / anxiety - patient reports severe anxiety. He has an outpatient psychiatrist.    DVT prophylaxis: Eliquis Code Status: Full Family Communication: no family bedside Disposition Plan: TBD, perhaps ILF 1-2 days   Consultants:   None   Procedures:   None   Antimicrobials:  Levaquin   Subjective: - no chest pain, no abdominal pain - still dyspneic however appreciates improvement - has many questions about his bipolar meds  Objective: Filed Vitals:   07/13/16 1039 07/13/16 1053 07/13/16 1946 07/14/16 0522  BP: 131/69  158/96 126/80  Pulse: 105  91 78  Temp: 98.2 F (36.8 C)  98.1 F (36.7 C) 97.8 F (36.6 C)  TempSrc: Oral  Oral Oral  Resp: '20  21 18  '$ Height: 6' (1.829 m)     Weight:  87.771 kg (193 lb 8 oz)  87.136 kg (192 lb 1.6 oz)  SpO2: 95%  98% 98%    Intake/Output Summary (Last 24 hours) at 07/14/16 1030 Last data filed at 07/14/16 0917  Gross per 24 hour  Intake   1080 ml  Output   1625 ml  Net   -545 ml   Filed Weights   07/13/16 0845 07/13/16 1053 07/14/16 0522  Weight: 88.1 kg (194 lb 3.6 oz) 87.771 kg (193 lb 8 oz) 87.136 kg (192 lb 1.6 oz)    Examination: Constitutional: NAD Filed Vitals:   07/13/16 1039 07/13/16 1053 07/13/16 1946 07/14/16 0522  BP: 131/69  158/96 126/80  Pulse: 105  91 78  Temp: 98.2 F (36.8 C)  98.1 F (36.7 C) 97.8 F (36.6 C)  TempSrc: Oral  Oral Oral  Resp: '20  21 18  '$ Height: 6' (1.829 m)     Weight:  87.771 kg (193  lb 8 oz)  87.136 kg (192 lb 1.6 oz)  SpO2: 95%  98% 98%   Eyes: PERRL, lids and conjunctivae normal Respiratory: no wheezing, bibasilar crackles. Normal respiratory effort. No accessory muscle use.  Cardiovascular: Regular rate and rhythm, no murmurs / rubs / gallops. 2+ LE edema. 2+ pedal pulses. + JVD Abdomen: no tenderness. Bowel sounds positive.  Musculoskeletal: no clubbing / cyanosis Skin: no rashes, lesions, ulcers. No induration Neurologic: non focal  Psychiatric: Normal judgment and insight. Alert and oriented x 3. Normal mood.    Data Reviewed: I have personally reviewed following labs and imaging studies  CBC:  Recent Labs Lab 07/08/16 1412 07/09/16 0451 07/13/16 0550  WBC 4.8 5.2 5.7  NEUTROABS 2.7  --  3.5  HGB 11.4* 10.9* 11.5*  HCT 36.3* 35.5* 37.0*  MCV 84.6 84.7 84.7  PLT 235 250 350   Basic Metabolic Panel:  Recent Labs Lab 07/08/16 1412 07/09/16 0451 07/13/16 0550 07/14/16 0209  NA 139 136 137 137  K 3.5 4.0 4.1 4.0  CL 103 99* 102 102  CO2 '28 29 27 27  '$ GLUCOSE 155* 188* 192* 185*  BUN 14 15 36* 37*  CREATININE 1.61* 1.59* 2.67* 2.33*  CALCIUM 8.9 8.4* 8.8* 8.8*  MG  --  0.9*  --   --   PHOS  --  3.5  --   --    GFR: Estimated Creatinine Clearance: 29.1 mL/min (by C-G formula based on Cr of 2.33). Liver Function Tests:  Recent Labs Lab 07/09/16 0451  AST 17  ALT 9*  ALKPHOS 38  BILITOT 0.8  PROT 6.8  ALBUMIN 2.5*   Coagulation Profile:  Recent Labs Lab 07/13/16 0550  INR 1.60*   CBG:  Recent Labs Lab 07/13/16 0941 07/13/16 1140 07/13/16 1725 07/13/16 2324 07/14/16 0621  GLUCAP 206* 225* 226* 195* 126*   Urine analysis:    Component Value Date/Time   COLORURINE YELLOW 08/25/2014 [place] 08/25/2014 1602   LABSPEC 1.008 08/25/2014 1602   PHURINE 6.5 08/25/2014 1602   GLUCOSEU NEGATIVE 08/25/2014 1602   HGBUR TRACE* 08/25/2014 1602   BILIRUBINUR NEGATIVE 08/25/2014 1602   KETONESUR NEGATIVE  08/25/2014 1602   PROTEINUR NEGATIVE 08/25/2014 1602   UROBILINOGEN 1.0 08/25/2014 1602   NITRITE NEGATIVE 08/25/2014 1602   LEUKOCYTESUR NEGATIVE 08/25/2014 1602    Recent Results (from the past 240 hour(s))  MRSA PCR Screening     Status: None   Collection Time: 07/09/16  4:45 AM  Result Value Ref Range Status   MRSA by PCR NEGATIVE NEGATIVE Final    Comment:        The GeneXpert MRSA Assay (FDA approved for NASAL specimens only), is one component of a comprehensive MRSA colonization surveillance program. It is not intended to  diagnose MRSA infection nor to guide or monitor treatment for MRSA infections.     Radiology Studies: Dg Chest 2 View  07/13/2016  CLINICAL DATA:  Shortness of breath and cough. EXAM: CHEST  2 VIEW COMPARISON:  05/11/2016 FINDINGS: Chronic cardiopericardial enlargement. Small pleural effusions, fissural thickening, interstitial opacity. Patchy bilateral lung opacification, greater on the right. Stable aortic contours with atherosclerotic calcification. IMPRESSION: 1. CHF. 2. Asymmetric right-sided alveolar edema versus superimposed pneumonia. Electronically Signed   By: Monte Fantasia M.D.   On: 07/13/2016 06:18   Scheduled Meds: . apixaban  2.5 mg Oral BID  . atorvastatin  20 mg Oral QHS  . cilostazol  50 mg Oral Q12H  . feeding supplement (GLUCERNA SHAKE)  237 mL Oral TID BM  . finasteride  5 mg Oral Daily  . furosemide  40 mg Intravenous Q12H  . gabapentin  300 mg Oral BID  . guaiFENesin  1,200 mg Oral BID  . insulin aspart  0-5 Units Subcutaneous QHS  . insulin aspart  0-9 Units Subcutaneous TID WC  . insulin glargine  20 Units Subcutaneous QHS  . levofloxacin (LEVAQUIN) IV  500 mg Intravenous Q48H  . pantoprazole  40 mg Oral Daily  . sodium chloride flush  3 mL Intravenous Q12H  . tamsulosin  0.4 mg Oral Daily   Continuous Infusions:   Marzetta Board, MD, PhD [facility] Pager [phone]  If 7PM-7AM, please contact  night-coverage [email or web address] Password [email or web address] 07/14/2016, 10:30 AM

## 2016-07-14 NOTE — Care Management Note (Addendum)
Case Management Note  Patient Details  Name: [name] MRN: [id number] Date of Birth: September 26, 1936  Subjective/Objective:    PNA, CHF                Action/Plan: Discharge Planning:  NCM spoke to pt and lives at home alone. Has RW and cane at home. Has HH aide that comes 7 days per week for 2.5 hours to assist with ADL's. Aide is covered under his [employer] benefits. Pt is [facility] to get [facility] to [facility]. Pt may need oxygen at home. Will evaluate oxygen needs prior to dc. Will continue to follow for dc needs.    PCP Beacher May MD    07/15/2016 Provided pt with Eliquis 30 day free trial card. Explained to pt how to use the card to get medications and that he can use any retail pharmacy to get trial medication. States he uses the [facility] to get his medications. Message sent to attending that prior auth needed.   S/W DONTE' @ [facility] # [phone]   ELIQUIS 2.5 MG (30 )  COVER- YES  CO-PAY- 25 % OF COAST  60TAB  TIER- 3 DRUG  PRIOR APPROVAL- YES # [phone]  PHARMACY : [facility]     Expected Discharge Date:                 Expected Discharge Plan:  Home/Self Care  In-House Referral:  NA  Discharge planning Services  CM Consult  Post Acute Care Choice:  NA Choice offered to:  NA  DME Arranged:  N/A DME Agency:  NA  HH Arranged:  NA HH Agency:  NA  Status of Service:  In process, will continue to follow  If discussed at Long Length of Stay Meetings, dates discussed:    Additional Comments:  Erenest Rasher, RN 07/14/2016, 4:58 PM

## 2016-07-14 NOTE — Progress Notes (Signed)
Patient requested a psych consult due to anxiety, Dr. Cruzita Lederer notified. MD stated he will discuss plan in the morning.

## 2016-07-15 DIAGNOSIS — I5033 Acute on chronic diastolic (congestive) heart failure: Secondary | ICD-10-CM

## 2016-07-15 DIAGNOSIS — J9601 Acute respiratory failure with hypoxia: Secondary | ICD-10-CM

## 2016-07-15 LAB — GLUCOSE, CAPILLARY
GLUCOSE-CAPILLARY: 203 mg/dL — AB (ref 65–99)
GLUCOSE-CAPILLARY: 292 mg/dL — AB (ref 65–99)
GLUCOSE-CAPILLARY: 250 mg/dL — AB (ref 65–99)
Glucose-Capillary: 148 mg/dL — ABNORMAL HIGH (ref 65–99)
Glucose-Capillary: 266 mg/dL — ABNORMAL HIGH (ref 65–99)

## 2016-07-15 LAB — BASIC METABOLIC PANEL
Anion gap: 6 (ref 5–15)
BUN: 35 mg/dL — ABNORMAL HIGH (ref 6–20)
CALCIUM: 9 mg/dL (ref 8.9–10.3)
CHLORIDE: 97 mmol/L — AB (ref 101–111)
CO2: 31 mmol/L (ref 22–32)
CREATININE: 2.2 mg/dL — AB (ref 0.61–1.24)
GFR, EST AFRICAN AMERICAN: 31 mL/min — AB (ref >60)
GFR, EST NON AFRICAN AMERICAN: 27 mL/min — AB (ref >60)
Glucose, Bld: 183 mg/dL — ABNORMAL HIGH (ref 65–99)
Potassium: 3.7 mmol/L (ref 3.5–5.1)
SODIUM: 134 mmol/L — AB (ref 135–145)

## 2016-07-15 LAB — CBC
HCT: 33.7 % — ABNORMAL LOW (ref 39.0–52.0)
Hemoglobin: 10.6 g/dL — ABNORMAL LOW (ref 13.0–17.0)
MCH: 26.6 pg (ref 26.0–34.0)
MCHC: 31.5 g/dL (ref 30.0–36.0)
MCV: 84.7 fL (ref 78.0–100.0)
Platelets: 223 10*3/uL (ref 150–400)
RBC: 3.98 MIL/uL — AB (ref 4.22–5.81)
RDW: 17.1 % — ABNORMAL HIGH (ref 11.5–15.5)
WBC: 6.1 10*3/uL (ref 4.0–10.5)

## 2016-07-15 MED ORDER — INSULIN GLARGINE 100 UNIT/ML ~~LOC~~ SOLN
30.0000 [IU] | Freq: Every day | SUBCUTANEOUS | Status: DC
  Administered 2016-07-15: 30 [IU] via SUBCUTANEOUS
  Filled 2016-07-15 (×2): qty 0.3

## 2016-07-15 NOTE — Progress Notes (Signed)
PROGRESS NOTE  [name] CWC:[id number] DOB: Oct 05, 1936 DOA: 07/13/2016 PCP: Beacher May, MD   LOS: 1 day   Brief Narrative: 80 y.o. male with a Past Medical History of OD, HTN, CAD, MI, PVD, DM, GERD, bipolar disorder, prostate cancer, A. fib, C KD, CVA, CHF who presents with acute respiratory failure likely secondary to CHF decompensation.   Assessment & Plan: Principal Problem:   Acute respiratory failure (HCC) Active Problems:   DM (diabetes mellitus), type 2 with renal complications (HCC)   Essential hypertension   CAD- last PCI 2011   GERD   Chronic atrial fibrillation (HCC)   Acute on chronic diastolic congestive heart failure (HCC)   Anemia   Chronic anticoagulation-Eliquis   Acute on chronic kidney failure (HCC)   Acute on chronic heart failure (HCC)    Acute respiratory failure likely related to acute on chronic heart failure and possible HCAP - Increased oxygen demand on presentation. He does not wear oxygen at home.  - wean off as tolerated - started on IV levaqun, complete the course. Possibly change to po in am.    Acute on chronic combined systolic and diastolic heart failure - most recent 2D echo 05/12/16 with EF 25-30% - IV Lasix, diuresed about 4 liters since admission, .  - improving today  Possible HCAP  complete the course of antibiotics with levaquin.   Acute on chronic kidney failure - Creatinine 2.67 on admission. Creatinine 5 days ago 1.61. Likely related to above, improved with diuresis, continue to monitor - Hold nephrotoxins as able, improving with diuresis.   Hypertension Well controlled. Resume home meds on discharge.   Chronic A. fib - Rate controlled. chadvasc score 8. He was on Eliquis at one time but this was stopped due to GI bleed. Chart review indicates evaluation in May of this year with GI and cardiology with decision to resume with close outpatient follow-up. Hemoglobin 11.5 on admission, repeat hemoglobin at 10.6 -  Continue eliquis - clinically no bleeding  CAD - Denies chest pain. EKG without acute changes.  Resume lipitor and pletal.  History gi bleed related to AVMs in setting of Eliquis.  - Hg. Stable. No s/sx bleeding. Of note Eliquis dose decreased - monitor  Diabetes CBG (last 3)   Recent Labs  07/15/16 0604 07/15/16 1128 07/15/16 1611  GLUCAP 203* 292* 266*   INCREASE lantus to 30 units daily.  Resume SSI.   Bipolar disorder / anxiety Better controlled.  Outpatient follow up.    DVT prophylaxis: Eliquis Code Status: Full Family Communication: no family bedside Disposition Plan: pendig PT eval.   Consultants:   None   Procedures:   None   Antimicrobials:  Levaquin   Subjective: reports coughing while eating, get slp eval.  Objective: Filed Vitals:   07/14/16 2206 07/15/16 0616 07/15/16 0617 07/15/16 1237  BP: 156/77 145/88  127/79  Pulse: 111 106  90  Temp: 97.4 F (36.3 C) 98 F (36.7 C)  98.6 F (37 C)  TempSrc: Oral Oral  Oral  Resp: 17 18    Height:      Weight:  85.458 kg (188 lb 6.4 oz)    SpO2: 94% 86% 92% 90%    Intake/Output Summary (Last 24 hours) at 07/15/16 1752 Last data filed at 07/15/16 1238  Gross per 24 hour  Intake    600 ml  Output   2901 ml  Net  -2301 ml   Filed Weights   07/13/16 1053 07/14/16 0522 07/15/16  5462  Weight: 87.771 kg (193 lb 8 oz) 87.136 kg (192 lb 1.6 oz) 85.458 kg (188 lb 6.4 oz)    Examination: Constitutional: NAD Filed Vitals:   07/14/16 2206 07/15/16 0616 07/15/16 0617 07/15/16 1237  BP: 156/77 145/88  127/79  Pulse: 111 106  90  Temp: 97.4 F (36.3 C) 98 F (36.7 C)  98.6 F (37 C)  TempSrc: Oral Oral  Oral  Resp: 17 18    Height:      Weight:  85.458 kg (188 lb 6.4 oz)    SpO2: 94% 86% 92% 90%    Respiratory: no wheezing, bibasilar crackles. Normal respiratory effort. No accessory muscle use.  Cardiovascular: Regular rate and rhythm, no murmurs / rubs / gallops. 2+ LE  edema. Abdomen: no tenderness. Bowel sounds positive. Soft and non distended.  Musculoskeletal: no clubbing / cyanosis Skin: no rashes, lesions, ulcers. No induration Neurologic: non focal , alert and oriented.     Data Reviewed: I have personally reviewed following labs and imaging studies  CBC:  Recent Labs Lab 07/09/16 0451 07/13/16 0550 07/15/16 0356  WBC 5.2 5.7 6.1  NEUTROABS  --  3.5  --   HGB 10.9* 11.5* 10.6*  HCT 35.5* 37.0* 33.7*  MCV 84.7 84.7 84.7  PLT 250 241 703   Basic Metabolic Panel:  Recent Labs Lab 07/09/16 0451 07/13/16 0550 07/14/16 0209 07/15/16 0356  NA 136 137 137 134*  K 4.0 4.1 4.0 3.7  CL 99* 102 102 97*  CO2 '29 27 27 31  '$ GLUCOSE 188* 192* 185* 183*  BUN 15 36* 37* 35*  CREATININE 1.59* 2.67* 2.33* 2.20*  CALCIUM 8.4* 8.8* 8.8* 9.0  MG 0.9*  --   --   --   PHOS 3.5  --   --   --    GFR: Estimated Creatinine Clearance: 30.9 mL/min (by C-G formula based on Cr of 2.2). Liver Function Tests:  Recent Labs Lab 07/09/16 0451  AST 17  ALT 9*  ALKPHOS 38  BILITOT 0.8  PROT 6.8  ALBUMIN 2.5*   Coagulation Profile:  Recent Labs Lab 07/13/16 0550  INR 1.60*   CBG:  Recent Labs Lab 07/14/16 1719 07/14/16 2201 07/15/16 0604 07/15/16 1128 07/15/16 1611  GLUCAP 272* 148* 203* 292* 266*   Urine analysis:    Component Value Date/Time   COLORURINE YELLOW 08/25/2014 [place] 08/25/2014 1602   LABSPEC 1.008 08/25/2014 1602   PHURINE 6.5 08/25/2014 1602   GLUCOSEU NEGATIVE 08/25/2014 1602   HGBUR TRACE* 08/25/2014 1602   BILIRUBINUR NEGATIVE 08/25/2014 1602   KETONESUR NEGATIVE 08/25/2014 1602   PROTEINUR NEGATIVE 08/25/2014 1602   UROBILINOGEN 1.0 08/25/2014 1602   NITRITE NEGATIVE 08/25/2014 1602   LEUKOCYTESUR NEGATIVE 08/25/2014 1602    Recent Results (from the past 240 hour(s))  MRSA PCR Screening     Status: None   Collection Time: 07/09/16  4:45 AM  Result Value Ref Range Status   MRSA by PCR  NEGATIVE NEGATIVE Final    Comment:        The GeneXpert MRSA Assay (FDA approved for NASAL specimens only), is one component of a comprehensive MRSA colonization surveillance program. It is not intended to diagnose MRSA infection nor to guide or monitor treatment for MRSA infections.     Radiology Studies: No results found. Scheduled Meds: . apixaban  2.5 mg Oral BID  . atorvastatin  20 mg Oral QHS  . cilostazol  50 mg Oral Q12H  .  feeding supplement (GLUCERNA SHAKE)  237 mL Oral TID BM  . finasteride  5 mg Oral Daily  . furosemide  40 mg Intravenous Q12H  . gabapentin  300 mg Oral BID  . guaiFENesin  1,200 mg Oral BID  . insulin aspart  0-5 Units Subcutaneous QHS  . insulin aspart  0-9 Units Subcutaneous TID WC  . insulin glargine  20 Units Subcutaneous QHS  . levofloxacin (LEVAQUIN) IV  500 mg Intravenous Q48H  . pantoprazole  40 mg Oral Daily  . sodium chloride flush  3 mL Intravenous Q12H  . tamsulosin  0.4 mg Oral Daily   Continuous Infusions:   Hosie Poisson MD,  [facility] Pager [phone]  If 7PM-7AM, please contact night-coverage [email or web address] Password [email or web address] 07/15/2016, 5:52 PM

## 2016-07-15 NOTE — Care Management Important Message (Signed)
Important Message  Patient Details  Name: [name] MRN: [id number] Date of Birth: February 10, 1936   Medicare Important Message Given:  Yes    [name] 07/15/2016, 10:05 AM

## 2016-07-15 NOTE — Evaluation (Signed)
Clinical/Bedside Swallow Evaluation Patient Details  Name: [name] MRN: [id number] Date of Birth: May 18, 1936  Today's Date: 07/15/2016 Time: SLP Start Time (ACUTE ONLY): 62 SLP Stop Time (ACUTE ONLY): 1425 SLP Time Calculation (min) (ACUTE ONLY): 17 min  Past Medical History:  Past Medical History  Diagnosis Date  . Hyperlipidemia   . HTN (hypertension)     echo 2/11: EF 50-55%, diast dyfxn, severe LVH, inf HK, LAE  . CAD (coronary artery disease)     a.  NSTEMI treated with PCI Feb 2011 with a DES.([facility]);   b. cath 2/11: D1 stents x 2 ok, AV groove CFX occluded with dist AV CFX filled by L-L collats, RCA occluded, OM2 90-95% (treated with PCI)  . MI (myocardial infarction) ([place])   . PVD (peripheral vascular disease) ([place])   . DM (diabetes mellitus) ([place])   . GERD (gastroesophageal reflux disease)   . Bipolar disorder ([place])   . Prostate cancer [facility])     status post radiation therapy in 2003  . Pulmonary edema   . Atrial fibrillation ([place])   . Respiratory difficulty 06/24/2014    intubated   . Hypertensive emergency 06/24/2014  . CKD (chronic kidney disease), stage III   . Anemia 11/28/2015  . Shortness of breath dyspnea   . Stroke ([place]) 07/2014  . Heart failure [facility])    Past Surgical History:  Past Surgical History  Procedure Laterality Date  . Femoral bypass  2003  . Esophagogastroduodenoscopy N/A 11/29/2015    Procedure: ESOPHAGOGASTRODUODENOSCOPY (EGD);  Surgeon: Wonda Horner, MD;  Location: [facility] ENDOSCOPY;  Service: Endoscopy;  Laterality: N/A;  . Esophagogastroduodenoscopy (egd) with propofol Left 12/25/2015    Procedure: ESOPHAGOGASTRODUODENOSCOPY (EGD) WITH PROPOFOL;  Surgeon: Arta Silence, MD;  Location: [facility] ENDOSCOPY;  Service: Endoscopy;  Laterality: Left;  Freda Munro capsule study N/A 01/26/2016    Procedure: GIVENS CAPSULE STUDY;  Surgeon: Ronald Lobo, MD;  Location: [facility] ENDOSCOPY;  Service: Endoscopy;  Laterality: N/A;  . Colonoscopy N/A 01/25/2016     Procedure: COLONOSCOPY;  Surgeon: Ronald Lobo, MD;  Location: [facility] ENDOSCOPY;  Service: Endoscopy;  Laterality: N/A;  . Esophagogastroduodenoscopy N/A 05/13/2016    Procedure: ESOPHAGOGASTRODUODENOSCOPY (EGD);  Surgeon: Clarene Essex, MD;  Location: [facility] ENDOSCOPY;  Service: Endoscopy;  Laterality: N/A;  . Hot hemostasis N/A 05/13/2016    Procedure: HOT HEMOSTASIS (ARGON PLASMA COAGULATION/BICAP);  Surgeon: Clarene Essex, MD;  Location: [facility] ENDOSCOPY;  Service: Endoscopy;  Laterality: N/A;   HPI:  80 y.o. male with medical history significant for CAD, heart failure, hypertension, diabetes, A. fib, CVA, GERD,  prostate cancer, presented to the emergency department with shortness of breath. Found to have acute respiratory failure related to acute on chronic heart failure as well as acute on chronic kidney disease. CXR asymmetric right-sided alveolar edema versus superimposed pneumonia. MBS 06/2015 aspiration of thin (not certain if sensed) prevented with chin tuck.   Assessment / Plan / Recommendation Clinical Impression  Pt with immediate cough x 1 of 5 trials thin liquid (cough with cup, not straw). Pt states he coughs sometimes if he takes a big sip and swallows in piecemeal fashion. Mastication with solids functional. Pt is at risk for aspiration given mild dysphagia with stroke in 2016. SLP educated pt re: importance of swallow strategies. Decreased insight of medical needs. Recommend continue regular texture and thin liquids, small sips straw allowed, pills with thin.      Aspiration Risk  Moderate aspiration risk    Diet Recommendation Regular;Thin liquid  Liquid Administration via: Cup;Straw Medication Administration: Whole meds with liquid Supervision: Patient able to self feed;Intermittent supervision to cue for compensatory strategies Compensations: Small sips/bites;Slow rate Postural Changes: Seated upright at 90 degrees    Other  Recommendations Oral Care Recommendations: Oral care BID    Follow up Recommendations  None    Frequency and Duration min 2x/week  2 weeks       Prognosis Prognosis for Safe Diet Advancement:  (fair-good) Barriers to Reach Goals: Cognitive deficits      Swallow Study   General HPI: 80 y.o. male with medical history significant for CAD, heart failure, hypertension, diabetes, A. fib, CVA, GERD,  prostate cancer, presented to the emergency department with shortness of breath. Found to have acute respiratory failure related to acute on chronic heart failure as well as acute on chronic kidney disease. CXR asymmetric right-sided alveolar edema versus superimposed pneumonia. MBS 06/2015 aspiration of thin (not certain if sensed) prevented with chin tuck. Type of Study: Bedside Swallow Evaluation Previous Swallow Assessment:  (see HPI) Diet Prior to this Study: Regular;Thin liquids Temperature Spikes Noted: No Respiratory Status: Nasal cannula History of Recent Intubation: No Behavior/Cognition: Alert;Cooperative;Pleasant mood Oral Cavity Assessment: Within Functional Limits Oral Care Completed by SLP: No Oral Cavity - Dentition: Dentures, top;Dentures, bottom Vision: Functional for self-feeding Self-Feeding Abilities: Able to feed self Patient Positioning:  (side of bed) Baseline Vocal Quality: Normal Volitional Cough: Strong Volitional Swallow: Able to elicit    Oral/Motor/Sensory Function Overall Oral Motor/Sensory Function: Within functional limits   Ice Chips Ice chips: Not tested   Thin Liquid Thin Liquid: Impaired Presentation: Cup;Straw Pharyngeal  Phase Impairments: Throat Clearing - Immediate;Cough - Immediate    Nectar Thick Nectar Thick Liquid: Not tested   Honey Thick Honey Thick Liquid: Not tested   Puree Puree: Not tested   Solid      Solid: Within functional limits        Houston Siren 07/15/2016,3:16 PM   [name].Ed [employer] [phone]

## 2016-07-16 ENCOUNTER — Inpatient Hospital Stay (HOSPITAL_COMMUNITY): Payer: Medicare Other

## 2016-07-16 LAB — BASIC METABOLIC PANEL
ANION GAP: 7 (ref 5–15)
BUN: 38 mg/dL — AB (ref 6–20)
CHLORIDE: 95 mmol/L — AB (ref 101–111)
CO2: 29 mmol/L (ref 22–32)
Calcium: 8.4 mg/dL — ABNORMAL LOW (ref 8.9–10.3)
Creatinine, Ser: 2.11 mg/dL — ABNORMAL HIGH (ref 0.61–1.24)
GFR, EST AFRICAN AMERICAN: 33 mL/min — AB (ref >60)
GFR, EST NON AFRICAN AMERICAN: 29 mL/min — AB (ref >60)
Glucose, Bld: 306 mg/dL — ABNORMAL HIGH (ref 65–99)
POTASSIUM: 3.8 mmol/L (ref 3.5–5.1)
SODIUM: 131 mmol/L — AB (ref 135–145)

## 2016-07-16 LAB — GLUCOSE, CAPILLARY
GLUCOSE-CAPILLARY: 270 mg/dL — AB (ref 65–99)
GLUCOSE-CAPILLARY: 252 mg/dL — AB (ref 65–99)
GLUCOSE-CAPILLARY: 205 mg/dL — AB (ref 65–99)
Glucose-Capillary: 238 mg/dL — ABNORMAL HIGH (ref 65–99)

## 2016-07-16 MED ORDER — INSULIN ASPART 100 UNIT/ML ~~LOC~~ SOLN
0.0000 [IU] | Freq: Three times a day (TID) | SUBCUTANEOUS | Status: DC
  Administered 2016-07-16: 5 [IU] via SUBCUTANEOUS
  Administered 2016-07-16: 8 [IU] via SUBCUTANEOUS
  Administered 2016-07-17: 3 [IU] via SUBCUTANEOUS
  Administered 2016-07-17: 2 [IU] via SUBCUTANEOUS
  Administered 2016-07-17: 11 [IU] via SUBCUTANEOUS

## 2016-07-16 MED ORDER — LEVOFLOXACIN 500 MG PO TABS: 500.0000 mg | ORAL_TABLET | ORAL | Status: DC

## 2016-07-16 MED ORDER — INSULIN GLARGINE 100 UNIT/ML ~~LOC~~ SOLN
40.0000 [IU] | Freq: Every day | SUBCUTANEOUS | Status: DC
  Filled 2016-07-16: qty 0.4

## 2016-07-16 MED ORDER — METOPROLOL TARTRATE 50 MG PO TABS
50.0000 mg | ORAL_TABLET | Freq: Two times a day (BID) | ORAL | Status: DC
  Administered 2016-07-16 – 2016-07-17 (×3): 50 mg via ORAL
  Filled 2016-07-16 (×3): qty 1

## 2016-07-16 NOTE — Progress Notes (Signed)
0740 Notified by CMT that pt has 7 runs of VTach broke spontaneously . Pt asymptomatic

## 2016-07-16 NOTE — Progress Notes (Signed)
Inpatient Diabetes Program Recommendations  AACE/ADA: New Consensus Statement on Inpatient Glycemic Control (2015)  Target Ranges:  Prepandial:   less than 140 mg/dL      Peak postprandial:   less than 180 mg/dL (1-2 hours)      Critically ill patients:  140 - 180 mg/dL   Results for LAVONTA, TILLIS (MRN [id number]) as of 07/16/2016 09:49  Ref. Range 07/15/2016 06:04 07/15/2016 11:28 07/15/2016 16:11 07/15/2016 21:20 07/16/2016 05:47  Glucose-Capillary Latest Ref Range: 65-99 mg/dL 203 (H) 292 (H) 266 (H) 250 (H) 270 (H)   Review of Glycemic Control  Diabetes history: DM Type 2 Outpatient Diabetes medications: Lantus 40 QHS, Regular 5 with breakfast and supper Current orders for Inpatient glycemic control: Lantus 30 units q hs + Novolog correction 0-9 units tid with meals  Inpatient Diabetes Program Recommendations: Noted CBGs elevated post prandial. Please consider adding Novolog 2-3 units tid with meals if eats > 50%.  Thank you, Nani Gasser. Christmas Faraci, RN, MSN, CDE Inpatient Glycemic Control Team Team Pager [phone] (8am-5pm) 07/16/2016 9:50 AM

## 2016-07-16 NOTE — Progress Notes (Signed)
MD, Pt takes Travatan eye drops at home not on his med list, please address, thanks Arvella Nigh RN.

## 2016-07-16 NOTE — Care Management Note (Signed)
Case Management Note  Patient Details  Name: [name] MRN: [id number] Date of Birth: 01-27-1936  Subjective/Objective:   CM following for progression and d/c planning.                 Action/Plan: 07/16/2016 Met with pt re HH needs, pt has [place] aide thru his [employer] benefits that provdes 2.5 hr of care for 7 days per week. He has had HH in the past and wishes to have the same [facility], Amy however her was unable to recall the name of the agency. Pt the pt hospital record he was set up with [facility] in January. This CM contacted [facility] for services and requested that the [facility] be Amy if she is available.   Expected Discharge Date:  07/17/2016              Expected Discharge Plan:  [place]  In-House Referral:  NA  Discharge planning Services  CM Consult  Post Acute Care Choice:  Home Health Choice offered to:  Patient  DME Arranged:  N/A DME Agency:  NA  HH Arranged:  RN, PT [place] Agency:  [place]  Status of Service:  Completed, signed off  If discussed at [place] of Stay Meetings, dates discussed:    Additional Comments:  Adron Bene, RN 07/16/2016, 12:58 PM

## 2016-07-16 NOTE — Progress Notes (Signed)
Speech Language Pathology Treatment: Dysphagia  Patient Details Name: [name] MRN: [id number] DOB: 1936-02-13 Today's Date: 07/16/2016 Time: 5956-3875 SLP Time Calculation (min) (ACUTE ONLY): 19 min  Assessment / Plan / Recommendation Clinical Impression  F/u after yesterday's swallowing assessment.  Lungs clear/diminished; awaiting results of today's CXR.  Tolerated regular solids, thin liquids with one incident of coughing associated with POs after SLP left the room.  Pt states he is coughing less today.  Pt self-regulates while drinking liquids, describing chronic, mild difficulty since 8/15 CVA.  He has adequate mastication, appropriate respiratory/swallowing reciprocity.  Likely with mild ongoing dysphagia - will follow pending D/C.   HPI HPI: 80 y.o. male with medical history significant for CAD, heart failure, hypertension, diabetes, A. fib, CVA, GERD,  prostate cancer, presented to the emergency department with shortness of breath. Found to have acute respiratory failure related to acute on chronic heart failure as well as acute on chronic kidney disease. CXR asymmetric right-sided alveolar edema versus superimposed pneumonia. MBS 06/2015 aspiration of thin (not certain if sensed) prevented with chin tuck.      SLP Plan  Continue with current plan of care     Recommendations  Diet recommendations: Regular;Thin liquid Liquids provided via: Cup Medication Administration: Whole meds with liquid Supervision: Patient able to self feed Compensations: Small sips/bites;Slow rate Postural Changes and/or Swallow Maneuvers: Seated upright 90 degrees             Oral Care Recommendations: Oral care BID Follow up Recommendations: None Plan: Continue with current plan of care     GO               [name] L. Tivis [name], [place] CCC/SLP Pager [phone]  [name] [name] 07/16/2016, 2:24 PM

## 2016-07-16 NOTE — Evaluation (Signed)
Physical Therapy Evaluation Patient Details Name: [name] MRN: [id number] DOB: 13-Nov-1936 Today's Date: 07/16/2016   History of Present Illness  80 yo male with onset of acute respiratory failure in setting of cardiac history was found to have HCAP.  PMHx:  CAD, MI, PVD, bipolar, CKD, CVA, CHF, EF 25-30%, a-fib, GI bleed  Clinical Impression  Pt is demonstrating some good tolerance from O2 standpoint for managing no O2 but pulses are high, up to 118.    SATURATION QUALIFICATIONS: (This note is used to comply with regulatory documentation for home oxygen)  Patient Saturations on Room Air at Rest = 95%  Patient Saturations on Room Air while Ambulating = 97%  Patient Saturations on 2 Liters of oxygen while Ambulating = 97%  Please briefly explain why patient needs home oxygen:  Pt has elevated pulses which may impact need for O2 despite maintenance of sats with no supplemental O2.    Follow Up Recommendations Home health PT;Supervision for mobility/OOB    Equipment Recommendations  Rolling walker with 5" wheels    Recommendations for Other Services Rehab consult     Precautions / Restrictions Precautions Precautions: Fall (telemetry) Restrictions Weight Bearing Restrictions: No      Mobility  Bed Mobility Overal bed mobility: Modified Independent                Transfers Overall transfer level: Needs assistance Equipment used: Rolling walker (2 wheeled);1 person hand held assist Transfers: Sit to/from [facility] Sit to Stand: Min assist;Mod assist Stand pivot transfers: Min assist          Ambulation/Gait Ambulation/Gait assistance: Min assist;Mod assist Ambulation Distance (Feet): 150 Feet Assistive device: Rolling walker (2 wheeled);1 person hand held assist (second person for chair and due to pt being off O2 for testi) Gait Pattern/deviations: Step-through pattern;Step-to pattern;Wide base of support;Trunk flexed;Drifts  right/left Gait velocity: reduced Gait velocity interpretation: Below normal speed for age/gender General Gait Details: has tendency to drift too close to wall and objects, not controlled with turning walker   Stairs            Wheelchair Mobility    Modified Rankin (Stroke Patients Only)       Balance                                             Pertinent Vitals/Pain Pain Assessment: No/denies pain    Home Living Family/patient expects to be discharged to:: Private residence Living Arrangements: Alone ([place] community)   Type of Home: Independent living facility Home Access: Level entry     Home Layout: One level Home Equipment: Cane - single point      Prior Function Level of Independence: Independent with assistive device(s)               Hand Dominance        Extremity/Trunk Assessment   Upper Extremity Assessment: Overall WFL for tasks assessed           Lower Extremity Assessment: Generalized weakness      Cervical / Trunk Assessment: Normal  Communication   Communication: No difficulties  Cognition Arousal/Alertness: Awake/alert Behavior During Therapy: Flat affect Overall Cognitive Status: No family/caregiver present to determine baseline cognitive functioning       Memory: Decreased short-term memory              General  Comments General comments (skin integrity, edema, etc.): Pt is up to walk with PT and second person assist to bring chair during challenge with no O2.  Pt is somewhat restrictive in what he will do, not as logical such as preferring to sit EOB rather than in chair.    Exercises        Assessment/Plan    PT Assessment Patient needs continued PT services  PT Diagnosis Difficulty walking   PT Problem List Decreased strength;Decreased range of motion;Decreased activity tolerance;Decreased balance;Decreased mobility;Decreased coordination;Decreased cognition;Decreased knowledge of use of  DME;Decreased safety awareness;Decreased knowledge of precautions;Cardiopulmonary status limiting activity  PT Treatment Interventions DME instruction;Gait training;Functional mobility training;Therapeutic activities;Therapeutic exercise;Balance training;Neuromuscular re-education;Patient/family education   PT Goals (Current goals can be found in the Care Plan section) Acute Rehab PT Goals Patient Stated Goal: to not overdo PT Goal Formulation: With patient Time For Goal Achievement: 07/30/16 Potential to Achieve Goals: Good    Frequency Min 3X/week   Barriers to discharge Decreased caregiver support      Co-evaluation               End of Session Equipment Utilized During Treatment: Gait belt Activity Tolerance: Patient tolerated treatment well Patient left: in chair;with call bell/phone within reach;with chair alarm set;with nursing/sitter in room Nurse Communication: Mobility status         Time: 7341-9379 PT Time Calculation (min) (ACUTE ONLY): 31 min   Charges:   PT Evaluation $PT Eval Moderate Complexity: 1 Procedure PT Treatments $Gait Training: 8-22 mins   PT G Codes:        Ramond Dial 13-Aug-2016, 1:15 PM    Mee Hives, PT MS Acute Rehab Dept. Number: [place] and [place]

## 2016-07-16 NOTE — Progress Notes (Signed)
PROGRESS NOTE  [name] JGG:[id number] DOB: [birth year] DOA: 07/13/2016 PCP: Beacher May, MD   LOS: 2 days   Brief Narrative: 80 y.o. male with a Past Medical History of OD, HTN, CAD, MI, PVD, DM, GERD, bipolar disorder, prostate cancer, A. fib, C KD, CVA, CHF who presents with acute respiratory failure likely secondary to CHF decompensation.   Assessment & Plan: Principal Problem:   Acute respiratory failure (HCC) Active Problems:   DM (diabetes mellitus), type 2 with renal complications (HCC)   Essential hypertension   CAD- last PCI 2011   GERD   Chronic atrial fibrillation (HCC)   Acute on chronic diastolic congestive heart failure (HCC)   Anemia   Chronic anticoagulation-Eliquis   Acute on chronic kidney failure (HCC)   Acute on chronic heart failure (HCC)    Acute respiratory failure likely related to acute on chronic heart failure and possible HCAP - Increased oxygen demand on presentation. He does not wear oxygen at home.  - wean off as tolerated, off oxygen today. Ambulated without oxygen with good sats.  - started on IV levaqun, complete the course, change to PO today.    Acute on chronic combined systolic and diastolic heart failure - most recent 2D echo 05/12/16 with EF 25-30% - IV Lasix, diuresed about 5.7 liters since admission, .  - improving, would continue IV lasix for 24 hours and monitor renal parameters.   Possible HCAP  complete the course of antibiotics with levaquin.   Acute on chronic kidney failure - Creatinine 2.67 on admission. Creatinine 5 days ago 1.61. Likely related to above, improved with diuresis, continue to monitor - Hold nephrotoxins as able, improving with diuresis.  - creatinine is 2.1 today.   Hypertension Well controlled. Resume home meds on discharge.   Chronic A. fib - Rate not well  controlled. chadvasc score 8. He was on Eliquis at one time but this was stopped due to GI bleed. Chart review indicates evaluation in  May of this year with GI and cardiology with decision to resume with close outpatient follow-up. Hemoglobin 11.5 on admission, repeat hemoglobin at 10.6 - Continue eliquis - clinically no bleeding - his metoprolol was stopped on admission, it will be resume today.   CAD - Denies chest pain. EKG without acute changes.  Resume lipitor and pletal.  History gi bleed related to AVMs in setting of Eliquis.  - Hg. Stable. No s/sx bleeding. Of note Eliquis dose decreased - monitor  Diabetes CBG (last 3)   Recent Labs  07/15/16 2120 07/16/16 0547 07/16/16 1130  GLUCAP 250* 270* 252*   INCREASE lantus to 40 units daily.  Change to moderate SSI.   Bipolar disorder / anxiety Better controlled.  Outpatient follow up.   Coughing while eating.  SLP eval DONE , recommended regular diet with thin liquids .    DVT prophylaxis: Eliquis Code Status: Full Family Communication: no family bedside Disposition Plan: possible d/c in am.    Consultants:   None   Procedures:   None   Antimicrobials:  Levaquin   Subjective: Reports breathing is better. Off oxygen.   Objective: Filed Vitals:   07/15/16 1237 07/15/16 2024 07/16/16 0438 07/16/16 1203  BP: 127/79 113/55 126/61 145/79  Pulse: 90 112 106 105  Temp: 98.6 F (37 C) 99 F (37.2 C) 99.8 F (37.7 C)   TempSrc: Oral Oral Oral   Resp:  '18 18 18  '$ Height:      Weight:   85.866 kg (  189 lb 4.8 oz)   SpO2: 90% 98% 98% 90%    Intake/Output Summary (Last 24 hours) at 07/16/16 1719 Last data filed at 07/16/16 1404  Gross per 24 hour  Intake   1020 ml  Output   2826 ml  Net  -1806 ml   Filed Weights   07/14/16 0522 07/15/16 0616 07/16/16 0438  Weight: 87.136 kg (192 lb 1.6 oz) 85.458 kg (188 lb 6.4 oz) 85.866 kg (189 lb 4.8 oz)    Examination: Constitutional: NAD Filed Vitals:   07/15/16 1237 07/15/16 2024 07/16/16 0438 07/16/16 1203  BP: 127/79 113/55 126/61 145/79  Pulse: 90 112 106 105  Temp: 98.6 F (37 C)  99 F (37.2 C) 99.8 F (37.7 C)   TempSrc: Oral Oral Oral   Resp:  '18 18 18  '$ Height:      Weight:   85.866 kg (189 lb 4.8 oz)   SpO2: 90% 98% 98% 90%    Respiratory: no wheezing, basilar rales improved.  Normal respiratory effort. No accessory muscle use.  Cardiovascular: tachycardic., no murmurs / rubs / gallops. Lower extremity edema improved.  Abdomen: no tenderness. Bowel sounds positive. Soft and non distended.  Musculoskeletal: no clubbing / cyanosis Skin: no rashes, lesions, ulcers. No induration Neurologic: non focal , alert and oriented.     Data Reviewed: I have personally reviewed following labs and imaging studies  CBC:  Recent Labs Lab 07/13/16 0550 07/15/16 0356  WBC 5.7 6.1  NEUTROABS 3.5  --   HGB 11.5* 10.6*  HCT 37.0* 33.7*  MCV 84.7 84.7  PLT 241 696   Basic Metabolic Panel:  Recent Labs Lab 07/13/16 0550 07/14/16 0209 07/15/16 0356 07/16/16 0208  NA 137 137 134* 131*  K 4.1 4.0 3.7 3.8  CL 102 102 97* 95*  CO2 '27 27 31 29  '$ GLUCOSE 192* 185* 183* 306*  BUN 36* 37* 35* 38*  CREATININE 2.67* 2.33* 2.20* 2.11*  CALCIUM 8.8* 8.8* 9.0 8.4*   GFR: Estimated Creatinine Clearance: 32.2 mL/min (by C-G formula based on Cr of 2.11). Liver Function Tests: No results for input(s): AST, ALT, ALKPHOS, BILITOT, PROT, ALBUMIN in the last 168 hours. Coagulation Profile:  Recent Labs Lab 07/13/16 0550  INR 1.60*   CBG:  Recent Labs Lab 07/15/16 1128 07/15/16 1611 07/15/16 2120 07/16/16 0547 07/16/16 1130  GLUCAP 292* 266* 250* 270* 252*   Urine analysis:    Component Value Date/Time   COLORURINE YELLOW 08/25/2014 [place] 08/25/2014 1602   LABSPEC 1.008 08/25/2014 1602   PHURINE 6.5 08/25/2014 1602   GLUCOSEU NEGATIVE 08/25/2014 1602   HGBUR TRACE* 08/25/2014 1602   BILIRUBINUR NEGATIVE 08/25/2014 1602   KETONESUR NEGATIVE 08/25/2014 1602   PROTEINUR NEGATIVE 08/25/2014 1602   UROBILINOGEN 1.0 08/25/2014 1602    NITRITE NEGATIVE 08/25/2014 1602   LEUKOCYTESUR NEGATIVE 08/25/2014 1602    Recent Results (from the past 240 hour(s))  MRSA PCR Screening     Status: None   Collection Time: 07/09/16  4:45 AM  Result Value Ref Range Status   MRSA by PCR NEGATIVE NEGATIVE Final    Comment:        The GeneXpert MRSA Assay (FDA approved for NASAL specimens only), is one component of a comprehensive MRSA colonization surveillance program. It is not intended to diagnose MRSA infection nor to guide or monitor treatment for MRSA infections.     Radiology Studies: Dg Chest 2 View  07/16/2016  CLINICAL DATA:  Sob  Today,,weakness EXAM: CHEST  2 VIEW COMPARISON:  07/13/2016 FINDINGS: Apical lordotic positioning. Heart size is mildly enlarged. There is persistent opacity at the right lateral lung base. Improved aeration in the left lung. Suspect right pleural effusion. IMPRESSION: Improved aeration on the left.  Persistent opacity on the right. Electronically Signed   By: Nolon Nations M.D.   On: 07/16/2016 15:05   Scheduled Meds: . apixaban  2.5 mg Oral BID  . atorvastatin  20 mg Oral QHS  . cilostazol  50 mg Oral Q12H  . feeding supplement (GLUCERNA SHAKE)  237 mL Oral TID BM  . finasteride  5 mg Oral Daily  . furosemide  40 mg Intravenous Q12H  . gabapentin  300 mg Oral BID  . guaiFENesin  1,200 mg Oral BID  . insulin aspart  0-15 Units Subcutaneous TID WC  . insulin aspart  0-5 Units Subcutaneous QHS  . insulin glargine  40 Units Subcutaneous QHS  . [START ON 07/18/2016] levofloxacin  500 mg Oral QODAY  . metoprolol tartrate  50 mg Oral BID  . pantoprazole  40 mg Oral Daily  . sodium chloride flush  3 mL Intravenous Q12H  . tamsulosin  0.4 mg Oral Daily   Continuous Infusions:   Hosie Poisson MD,  [facility] Pager [phone]  If 7PM-7AM, please contact night-coverage [email or web address] Password [facility] 07/16/2016, 5:19 PM

## 2016-07-17 DIAGNOSIS — N189 Chronic kidney disease, unspecified: Secondary | ICD-10-CM

## 2016-07-17 DIAGNOSIS — N179 Acute kidney failure, unspecified: Secondary | ICD-10-CM

## 2016-07-17 LAB — BASIC METABOLIC PANEL
ANION GAP: 7 (ref 5–15)
BUN: 33 mg/dL — AB (ref 6–20)
CALCIUM: 9.2 mg/dL (ref 8.9–10.3)
CO2: 33 mmol/L — AB (ref 22–32)
Chloride: 96 mmol/L — ABNORMAL LOW (ref 101–111)
Creatinine, Ser: 2.09 mg/dL — ABNORMAL HIGH (ref 0.61–1.24)
GFR calc Af Amer: 33 mL/min — ABNORMAL LOW (ref >60)
GFR, EST NON AFRICAN AMERICAN: 29 mL/min — AB (ref >60)
GLUCOSE: 138 mg/dL — AB (ref 65–99)
Potassium: 3.9 mmol/L (ref 3.5–5.1)
Sodium: 136 mmol/L (ref 135–145)

## 2016-07-17 LAB — BRAIN NATRIURETIC PEPTIDE: B Natriuretic Peptide: 642.9 pg/mL — ABNORMAL HIGH (ref 0.0–100.0)

## 2016-07-17 LAB — MAGNESIUM
Magnesium: 1 mg/dL — ABNORMAL LOW (ref 1.7–2.4)
Magnesium: 1.1 mg/dL — ABNORMAL LOW (ref 1.7–2.4)
Magnesium: 2.6 mg/dL — ABNORMAL HIGH (ref 1.7–2.4)

## 2016-07-17 LAB — GLUCOSE, CAPILLARY
GLUCOSE-CAPILLARY: 316 mg/dL — AB (ref 65–99)
GLUCOSE-CAPILLARY: 157 mg/dL — AB (ref 65–99)
GLUCOSE-CAPILLARY: 133 mg/dL — AB (ref 65–99)

## 2016-07-17 MED ORDER — MAGNESIUM OXIDE 400 (241.3 MG) MG PO TABS
400.0000 mg | ORAL_TABLET | Freq: Every day | ORAL | Status: DC
  Administered 2016-07-17: 400 mg via ORAL
  Filled 2016-07-17: qty 1

## 2016-07-17 MED ORDER — FUROSEMIDE 40 MG PO TABS: 60.0000 mg | ORAL_TABLET | Freq: Every day | ORAL | Status: DC

## 2016-07-17 MED ORDER — MAGNESIUM SULFATE 4 GM/100ML IV SOLN
4.0000 g | Freq: Once | INTRAVENOUS | Status: AC
Start: 2016-07-17 — End: 2016-07-17
  Administered 2016-07-17: 4 g via INTRAVENOUS
  Filled 2016-07-17 (×2): qty 100

## 2016-07-17 MED ORDER — INSULIN GLARGINE 100 UNIT/ML ~~LOC~~ SOLN
40.0000 [IU] | Freq: Every day | SUBCUTANEOUS | Status: DC
  Administered 2016-07-17: 40 [IU] via SUBCUTANEOUS
  Filled 2016-07-17: qty 0.4

## 2016-07-17 MED ORDER — LEVOFLOXACIN 500 MG PO TABS: 500.0000 mg | ORAL_TABLET | ORAL | Status: DC

## 2016-07-17 MED ORDER — FUROSEMIDE 40 MG PO TABS: 40.0000 mg | ORAL_TABLET | Freq: Every day | ORAL | Status: DC

## 2016-07-17 NOTE — Progress Notes (Signed)
Physical Therapy Treatment Patient Details Name: [name] MRN: [id number] DOB: [birth year] Today's Date: 07/17/2016    History of Present Illness 80 yo male with onset of acute respiratory failure in setting of cardiac history was found to have HCAP.  PMHx:  CAD, MI, PVD, bipolar, CKD, CVA, CHF, EF 25-30%, a-fib, GI bleed    PT Comments    Pt progressing well with therapy.  Pt reports he has also been up with staff walking in halls.  Pt remains to require cues for safety.  Pt performing gait training and standing HEP.  O2 sats 90-93% on RA and HR 80-98 bpm during tx.    Follow Up Recommendations  Home health PT;Supervision for mobility/OOB     Equipment Recommendations  Rolling walker with 5" wheels    Recommendations for Other Services       Precautions / Restrictions Precautions Precautions: Fall Restrictions Weight Bearing Restrictions: No    Mobility  Bed Mobility Overal bed mobility: Modified Independent                Transfers Overall transfer level: Needs assistance Equipment used: Rolling walker (2 wheeled) Transfers: Sit to/from Stand Sit to Stand: Supervision Stand pivot transfers: Supervision       General transfer comment: Cues for hand placement as patient attempts to reach and pull on RW during sit to stand and stand to sit.    Ambulation/Gait Ambulation/Gait assistance: Min guard Ambulation Distance (Feet): 140 Feet Assistive device: Rolling walker (2 wheeled) Gait Pattern/deviations: Step-through pattern;Trunk flexed;Shuffle Gait velocity: reduced   General Gait Details: Pt ambulates in flip flops.  Pt required cues for forward gaze and B foot clearance.  Pt demonstrates good negotiation of obstacles in halls.     Stairs            Wheelchair Mobility    Modified Rankin (Stroke Patients Only)       Balance                                    Cognition Arousal/Alertness: Awake/alert Behavior During  Therapy: WFL for tasks assessed/performed Overall Cognitive Status: No family/caregiver present to determine baseline cognitive functioning       Memory: Decreased short-term memory              Exercises General Exercises - Lower Extremity Hip Flexion/Marching: AROM;Both;Standing;10 reps Heel Raises: AROM;Both;10 reps;Standing Mini-Sqauts: AROM;Both;10 reps;Standing    General Comments        Pertinent Vitals/Pain Pain Assessment: No/denies pain    Home Living                      Prior Function            PT Goals (current goals can now be found in the care plan section) Acute Rehab PT Goals Patient Stated Goal: to talk to the case manager about his plans (informed nursing. ) Potential to Achieve Goals: Good Progress towards PT goals: Progressing toward goals    Frequency  Min 3X/week    PT Plan Current plan remains appropriate    Co-evaluation             End of Session Equipment Utilized During Treatment: Gait belt Activity Tolerance: Patient tolerated treatment well Patient left: in chair;with call bell/phone within reach;with chair alarm set;with nursing/sitter in room     Time: 5361-4431 PT Time Calculation (min) (ACUTE  ONLY): 18 min  Charges:  $Gait Training: 8-22 mins                    G Codes:      [name] July 31, 2016, 3:47 PM  Governor Rooks, PTA pager [phone]

## 2016-07-17 NOTE — Progress Notes (Signed)
Speech Language Pathology Treatment: Dysphagia  Patient Details Name: [name] MRN: [id number] DOB: 03/26/1936 Today's Date: 07/17/2016 Time: 6659-9357 SLP Time Calculation (min) (ACUTE ONLY): 18 min  Assessment / Plan / Recommendation Clinical Impression  Pt slightly groggy today; RN reports had Ativan secondary to anxiety.  Demonstrates improved swallow to baseline level of function.  No cough with intake; mod I with basic precautions.  No further f/u needed.  Pt for D/C home this afternoon.     HPI HPI: 80 y.o. male with medical history significant for CAD, heart failure, hypertension, diabetes, A. fib, CVA, GERD,  prostate cancer, presented to the emergency department with shortness of breath. Found to have acute respiratory failure related to acute on chronic heart failure as well as acute on chronic kidney disease. CXR asymmetric right-sided alveolar edema versus superimposed pneumonia. MBS 06/2015 aspiration of thin (not certain if sensed) prevented with chin tuck.      SLP Plan  All goals met     Recommendations  Diet recommendations: Regular;Thin liquid Liquids provided via: Cup Medication Administration: Whole meds with liquid Supervision: Patient able to self feed Postural Changes and/or Swallow Maneuvers: Seated upright 90 degrees             Oral Care Recommendations: Oral care BID Follow up Recommendations: None Plan: All goals met     GO                [name] 07/17/2016, 2:04 PM

## 2016-07-17 NOTE — Progress Notes (Signed)
Pt had 5-6 runs of vtach as reported by CMT . Pt asymptomatic . Dr Karleen Hampshire made aware . With order Mg level , BNP drawn by lab . Dr Karleen Hampshire updated the pt with orders

## 2016-07-17 NOTE — Care Management Important Message (Signed)
Important Message  Patient Details  Name: [name] MRN: [id number] Date of Birth: 18-Sep-1936   Medicare Important Message Given:  Yes    Lola Czerwonka, Leroy Sea 07/17/2016, 8:21 AM

## 2016-07-20 NOTE — Discharge Summary (Addendum)
Physician Discharge Summary  [name] OBS:[id number] DOB: [birth year] DOA: 07/13/2016  PCP: Beacher May, MD  Admit date: 07/13/2016 Discharge date: 07/17/2016  Admitted From: [facility].  Disposition:  [facility].   Recommendations for Outpatient Follow-up:  1. Follow up with PCP in 1-2 weeks 2. Please obtain BMP/CBC in one week 3. Please follow up with cardiology in one week.     Discharge Condition:stable.  CODE STATUS: full code.  Diet recommendation: Heart Healthy / Carb Modified /  Brief/Interim Summary: 80 y.o. male with a Past Medical History of OD, HTN, CAD, MI, PVD, DM, GERD, bipolar disorder, prostate cancer, A. fib, C KD, CVA, CHF who presents with acute respiratory failure likely secondary to CHF decompensation.    Discharge Diagnoses:  Principal Problem:   Acute respiratory failure ([place]) Active Problems:   DM (diabetes mellitus), type 2 with renal complications (HCC)   Essential hypertension   CAD- last PCI 2011   GERD   Chronic atrial fibrillation (HCC)   Acute on chronic diastolic congestive heart failure (HCC)   Anemia   Chronic anticoagulation-Eliquis   Acute on chronic kidney failure (HCC)   Acute on chronic heart failure (HCC)  Acute respiratory failure likely related to acute on chronic heart failure and possible HCAP - Increased oxygen demand on presentation. He does not wear oxygen at home.  - wean off as tolerated, off oxygen today. Ambulated without oxygen with good sats.  - started on IV levaqun, complete the course on discharge.    Acute on chronic combined systolic and diastolic heart failure - most recent 2D echo 05/12/16 with EF 25-30% - IV Lasix, diuresed about 5.7 liters since admission, .  - improving, change lasix to 60 mg daily on discharge.  repleted magnesium.   Possible HCAP  complete the course of antibiotics with levaquin.   Acute on chronic kidney failure - Creatinine 2.67 on admission. Creatinine 5 days ago 1.61. Likely  related to above, improved with diuresis, continue to monitor - Hold nephrotoxins as able, improving with diuresis.  - creatinine is 2 on discharge. Improving.  Recommend outpatient follow up.   Hypertension Well controlled. Resume home meds on discharge.   Chronic A. fib - Rate well  controlled. chadvasc score 8. He was on Eliquis at one time but this was stopped due to GI bleed. Chart review indicates evaluation in May of this year with GI and cardiology with decision to resume with close outpatient follow-up. Hemoglobin 11.5 on admission, repeat hemoglobin at 10.6 - Continue eliquis - clinically no bleeding - his metoprolol was stopped on admission, it was resumed.   CAD - Denies chest pain. EKG without acute changes.  Resume lipitor and pletal.  History gi bleed related to AVMs in setting of Eliquis.  - Hg. Stable. No s/sx bleeding. Of note Eliquis dose decreased - monitor  Diabetes CBG (last 3)   Recent Labs (last 2 labs)    Recent Labs  07/15/16 2120 07/16/16 0547 07/16/16 1130  GLUCAP 250* 270* 252*     INCREASE lantus to 40 units daily.  Change to moderate SSI.   Bipolar disorder / anxiety Better controlled.  Outpatient follow up.   Coughing while eating.  SLP eval DONE , recommended regular diet with thin liquids .     Discharge Instructions  Discharge Instructions    (HEART FAILURE PATIENTS) Call MD:  Anytime you have any of the following symptoms: 1) 3 pound weight gain in 24 hours or 5 pounds in 1 week  2) shortness of breath, with or without a dry hacking cough 3) swelling in the hands, feet or stomach 4) if you have to sleep on extra pillows at night in order to breathe.    Complete by:  As directed   Diet - low sodium heart healthy    Complete by:  As directed   Discharge instructions    Complete by:  As directed   Please follow up with cardiology in one week and check cbc and bmp at office visit.  Please follow up with PCP in 5 days.        Medication List    STOP taking these medications   diclofenac sodium 1 % Gel Commonly known as:  VOLTAREN     TAKE these medications   acetaminophen 325 MG tablet Commonly known as:  TYLENOL Take 650 mg by mouth every 8 (eight) hours as needed for mild pain or fever (pain).   albuterol 108 (90 Base) MCG/ACT inhaler Commonly known as:  PROVENTIL HFA;VENTOLIN HFA Inhale 2 puffs into the lungs every 6 (six) hours as needed for shortness of breath.   ammonium lactate 12 % lotion Commonly known as:  LAC-HYDRIN Apply 1 application topically daily.   apixaban 2.5 MG Tabs tablet Commonly known as:  ELIQUIS Take 2.5 mg by mouth every 12 (twelve) hours. What changed:  Another medication with the same name was removed. Continue taking this medication, and follow the directions you see here.   atorvastatin 40 MG tablet Commonly known as:  LIPITOR Take 1 tablet (40 mg total) by mouth daily. What changed:  how much to take  when to take this   cilostazol 50 MG tablet Commonly known as:  PLETAL Take 50 mg by mouth every 12 (twelve) hours.   dextrose 40 % Gel Commonly known as:  GLUTOSE Take 1 Tube by mouth daily as needed for low blood sugar.   feeding supplement (GLUCERNA SHAKE) Liqd Take 237 mLs by mouth 3 (three) times daily between meals.   finasteride 5 MG tablet Commonly known as:  PROSCAR Take 5 mg by mouth daily.   furosemide 40 MG tablet Commonly known as:  LASIX Take 1.5 tablets (60 mg total) by mouth daily. What changed:  how much to take   gabapentin 300 MG capsule Commonly known as:  NEURONTIN Take 300 mg by mouth 2 (two) times daily.   guaifenesin 400 MG Tabs tablet Commonly known as:  HUMIBID E Take 400 mg by mouth 3 (three) times daily as needed. For congestion   insulin glargine 100 UNIT/ML injection Commonly known as:  LANTUS Inject 40 Units into the skin daily.   insulin regular 100 units/mL injection Commonly known as:  NOVOLIN R,HUMULIN  R Inject 5 Units into the skin See admin instructions. Inject 5 units under the skin before breakfast and inject 5 units before evening meal   levofloxacin 500 MG tablet Commonly known as:  LEVAQUIN Take 1 tablet (500 mg total) by mouth every other day.   metoprolol 50 MG tablet Commonly known as:  LOPRESSOR Take 50 mg by mouth 2 (two) times daily.   multivitamin with minerals tablet Take 1 tablet by mouth daily.   nystatin 100000 UNIT/ML suspension Commonly known as:  MYCOSTATIN Take 5 mLs (500,000 Units total) by mouth 4 (four) times daily.   pantoprazole 40 MG tablet Commonly known as:  PROTONIX Take 1 tablet (40 mg total) by mouth daily.   simethicone 80 MG chewable tablet Commonly known as:  MYLICON Chew 80 mg by mouth 3 (three) times daily as needed for flatulence. Take with meals as needed   tamsulosin 0.4 MG Caps capsule Commonly known as:  FLOMAX Take 0.4 mg by mouth daily.   vitamin B-12 1000 MCG tablet Commonly known as:  CYANOCOBALAMIN Take 1,000 mcg by mouth daily.      Follow-up Information    Beacher May, MD. Schedule an appointment as soon as possible for a visit in 1 week.   Specialty:  Internal Medicine Why:  please check cbc and bmp Contact information: [place] [address] [phone]        Sanda Klein, MD. Schedule an appointment as soon as possible for a visit in 1 week.   Specialty:  Cardiology Contact information: [address] [place] [address] [phone]          Allergies  Allergen Reactions  . Penicillins Swelling    Has patient had a PCN reaction causing immediate rash, facial/tongue/throat swelling, SOB or lightheadedness with hypotension: Yes Has patient had a PCN reaction causing severe rash involving mucus membranes or skin necrosis: No Has patient had a PCN reaction that required hospitalization: No Has patient had a PCN reaction occurring within the last 10 years: No If  all of the above answers are "NO", then may proceed with Cephalosporin use.    Consultations:  none   Procedures/Studies: Dg Chest 2 View  Result Date: 07/16/2016 CLINICAL DATA:  Sob  Today,,weakness EXAM: CHEST  2 VIEW COMPARISON:  07/13/2016 FINDINGS: Apical lordotic positioning. Heart size is mildly enlarged. There is persistent opacity at the right lateral lung base. Improved aeration in the left lung. Suspect right pleural effusion. IMPRESSION: Improved aeration on the left.  Persistent opacity on the right. Electronically Signed   By: Nolon Nations M.D.   On: 07/16/2016 15:05   Dg Chest 2 View  Result Date: 07/13/2016 CLINICAL DATA:  Shortness of breath and cough. EXAM: CHEST  2 VIEW COMPARISON:  05/11/2016 FINDINGS: Chronic cardiopericardial enlargement. Small pleural effusions, fissural thickening, interstitial opacity. Patchy bilateral lung opacification, greater on the right. Stable aortic contours with atherosclerotic calcification. IMPRESSION: 1. CHF. 2. Asymmetric right-sided alveolar edema versus superimposed pneumonia. Electronically Signed   By: Monte Fantasia M.D.   On: 07/13/2016 06:18       Subjective:  Denies any new complaints.  Discharge Exam: Vitals:   07/17/16 1100 07/17/16 1413  BP: (!) 132/52 120/61  Pulse:  66  Resp: 18 18  Temp:     Vitals:   07/16/16 1928 07/17/16 0401 07/17/16 1100 07/17/16 1413  BP: 131/68 (!) 115/50 (!) 132/52 120/61  Pulse: (!) 101 89  66  Resp: '18 18 18 18  '$ Temp: 98.8 F (37.1 C) 98.7 F (37.1 C)    TempSrc: Oral Oral    SpO2: 94% 95% 98% 99%  Weight:  85.6 kg (188 lb 11.2 oz)    Height:        General: Pt is alert, awake, not in acute distress Cardiovascular: RRR, S1/S2 +, no rubs, no gallops Respiratory: CTA bilaterally, no wheezing, no rhonchi Abdominal: Soft, NT, ND, bowel sounds + Extremities: no edema, no cyanosis    The results of significant diagnostics from this hospitalization (including imaging,  microbiology, ancillary and laboratory) are listed below for reference.     Microbiology: No results found for this or any previous visit (from the past 240 hour(s)).   Labs: BNP (last 3 results)  Recent  Labs  07/13/16 0550 07/17/16 1322  BNP 1,283.3* 208.0*   Basic Metabolic Panel:  Recent Labs Lab 07/14/16 0209 07/15/16 0356 07/16/16 0208 07/17/16 1038 07/17/16 1240 07/17/16 1322 07/17/16 1648  NA 137 134* 131* 136  --   --   --   K 4.0 3.7 3.8 3.9  --   --   --   CL 102 97* 95* 96*  --   --   --   CO2 '27 31 29 '$ 33*  --   --   --   GLUCOSE 185* 183* 306* 138*  --   --   --   BUN 37* 35* 38* 33*  --   --   --   CREATININE 2.33* 2.20* 2.11* 2.09*  --   --   --   CALCIUM 8.8* 9.0 8.4* 9.2  --   --   --   MG  --   --   --   --  1.1* 1.0* 2.6*   Liver Function Tests: No results for input(s): AST, ALT, ALKPHOS, BILITOT, PROT, ALBUMIN in the last 168 hours. No results for input(s): LIPASE, AMYLASE in the last 168 hours. No results for input(s): AMMONIA in the last 168 hours. CBC:  Recent Labs Lab 07/15/16 0356  WBC 6.1  HGB 10.6*  HCT 33.7*  MCV 84.7  PLT 223   Cardiac Enzymes: No results for input(s): CKTOTAL, CKMB, CKMBINDEX, TROPONINI in the last 168 hours. BNP: Invalid input(s): POCBNP CBG:  Recent Labs Lab 07/16/16 1737 07/16/16 2206 07/17/16 0558 07/17/16 1209 07/17/16 1705  GLUCAP 238* 205* 316* 157* 133*   D-Dimer No results for input(s): DDIMER in the last 72 hours. Hgb A1c No results for input(s): HGBA1C in the last 72 hours. Lipid Profile No results for input(s): CHOL, HDL, LDLCALC, TRIG, CHOLHDL, LDLDIRECT in the last 72 hours. Thyroid function studies No results for input(s): TSH, T4TOTAL, T3FREE, THYROIDAB in the last 72 hours.  Invalid input(s): FREET3 Anemia work up No results for input(s): VITAMINB12, FOLATE, FERRITIN, TIBC, IRON, RETICCTPCT in the last 72 hours. Urinalysis    Component Value Date/Time   COLORURINE YELLOW  08/25/2014 1602   APPEARANCEUR CLEAR 08/25/2014 1602   LABSPEC 1.008 08/25/2014 1602   PHURINE 6.5 08/25/2014 1602   GLUCOSEU NEGATIVE 08/25/2014 1602   HGBUR TRACE (A) 08/25/2014 1602   BILIRUBINUR NEGATIVE 08/25/2014 1602   KETONESUR NEGATIVE 08/25/2014 1602   PROTEINUR NEGATIVE 08/25/2014 1602   UROBILINOGEN 1.0 08/25/2014 1602   NITRITE NEGATIVE 08/25/2014 1602   LEUKOCYTESUR NEGATIVE 08/25/2014 1602   Sepsis Labs Invalid input(s): PROCALCITONIN,  WBC,  LACTICIDVEN Microbiology No results found for this or any previous visit (from the past 240 hour(s)).   Time coordinating discharge: Over 30 minutes  SIGNED:   Hosie Poisson, MD  [facility] 07/20/2016, 7:00 AM Pager   If 7PM-7AM, please contact night-coverage [email or web address] Password [email or web address]

## 2016-07-22 ENCOUNTER — Inpatient Hospital Stay (HOSPITAL_COMMUNITY)
Admission: EM | Admit: 2016-07-22 | Discharge: 2016-07-25 | DRG: 291 | Disposition: A | Discharge status: Home Health | Payer: Medicare Other | Attending: Internal Medicine | Admitting: Internal Medicine

## 2016-07-22 ENCOUNTER — Encounter (HOSPITAL_COMMUNITY): Payer: Self-pay

## 2016-07-22 ENCOUNTER — Emergency Department (HOSPITAL_COMMUNITY): Payer: Medicare Other

## 2016-07-22 DIAGNOSIS — F319 Bipolar disorder, unspecified: Secondary | ICD-10-CM | POA: Diagnosis present

## 2016-07-22 DIAGNOSIS — K219 Gastro-esophageal reflux disease without esophagitis: Secondary | ICD-10-CM | POA: Diagnosis present

## 2016-07-22 DIAGNOSIS — R7989 Other specified abnormal findings of blood chemistry: Secondary | ICD-10-CM | POA: Diagnosis present

## 2016-07-22 DIAGNOSIS — E1129 Type 2 diabetes mellitus with other diabetic kidney complication: Secondary | ICD-10-CM | POA: Diagnosis present

## 2016-07-22 DIAGNOSIS — N183 Chronic kidney disease, stage 3 unspecified: Secondary | ICD-10-CM | POA: Diagnosis present

## 2016-07-22 DIAGNOSIS — I252 Old myocardial infarction: Secondary | ICD-10-CM

## 2016-07-22 DIAGNOSIS — N179 Acute kidney failure, unspecified: Secondary | ICD-10-CM | POA: Diagnosis present

## 2016-07-22 DIAGNOSIS — I5023 Acute on chronic systolic (congestive) heart failure: Secondary | ICD-10-CM | POA: Diagnosis present

## 2016-07-22 DIAGNOSIS — E1140 Type 2 diabetes mellitus with diabetic neuropathy, unspecified: Secondary | ICD-10-CM | POA: Diagnosis present

## 2016-07-22 DIAGNOSIS — I739 Peripheral vascular disease, unspecified: Secondary | ICD-10-CM | POA: Diagnosis present

## 2016-07-22 DIAGNOSIS — I2510 Atherosclerotic heart disease of native coronary artery without angina pectoris: Secondary | ICD-10-CM

## 2016-07-22 DIAGNOSIS — J449 Chronic obstructive pulmonary disease, unspecified: Secondary | ICD-10-CM | POA: Diagnosis present

## 2016-07-22 DIAGNOSIS — I509 Heart failure, unspecified: Secondary | ICD-10-CM

## 2016-07-22 DIAGNOSIS — I130 Hypertensive heart and chronic kidney disease with heart failure and stage 1 through stage 4 chronic kidney disease, or unspecified chronic kidney disease: Principal | ICD-10-CM | POA: Diagnosis present

## 2016-07-22 DIAGNOSIS — E1169 Type 2 diabetes mellitus with other specified complication: Secondary | ICD-10-CM | POA: Diagnosis present

## 2016-07-22 DIAGNOSIS — Z880 Allergy status to penicillin: Secondary | ICD-10-CM

## 2016-07-22 DIAGNOSIS — I5033 Acute on chronic diastolic (congestive) heart failure: Secondary | ICD-10-CM | POA: Diagnosis not present

## 2016-07-22 DIAGNOSIS — E785 Hyperlipidemia, unspecified: Secondary | ICD-10-CM | POA: Diagnosis present

## 2016-07-22 DIAGNOSIS — I5043 Acute on chronic combined systolic (congestive) and diastolic (congestive) heart failure: Secondary | ICD-10-CM | POA: Diagnosis present

## 2016-07-22 DIAGNOSIS — I4510 Unspecified right bundle-branch block: Secondary | ICD-10-CM | POA: Diagnosis present

## 2016-07-22 DIAGNOSIS — Z7901 Long term (current) use of anticoagulants: Secondary | ICD-10-CM | POA: Diagnosis not present

## 2016-07-22 DIAGNOSIS — I2582 Chronic total occlusion of coronary artery: Secondary | ICD-10-CM | POA: Diagnosis present

## 2016-07-22 DIAGNOSIS — R778 Other specified abnormalities of plasma proteins: Secondary | ICD-10-CM | POA: Diagnosis present

## 2016-07-22 DIAGNOSIS — R609 Edema, unspecified: Secondary | ICD-10-CM | POA: Diagnosis present

## 2016-07-22 DIAGNOSIS — F310 Bipolar disorder, current episode hypomanic: Secondary | ICD-10-CM | POA: Diagnosis not present

## 2016-07-22 DIAGNOSIS — F3170 Bipolar disorder, currently in remission, most recent episode unspecified: Secondary | ICD-10-CM

## 2016-07-22 DIAGNOSIS — Z79899 Other long term (current) drug therapy: Secondary | ICD-10-CM

## 2016-07-22 DIAGNOSIS — I472 Ventricular tachycardia: Secondary | ICD-10-CM | POA: Diagnosis not present

## 2016-07-22 DIAGNOSIS — Z720 Tobacco use: Secondary | ICD-10-CM | POA: Diagnosis present

## 2016-07-22 DIAGNOSIS — I482 Chronic atrial fibrillation, unspecified: Secondary | ICD-10-CM | POA: Diagnosis present

## 2016-07-22 DIAGNOSIS — I2489 Other forms of acute ischemic heart disease: Secondary | ICD-10-CM | POA: Diagnosis present

## 2016-07-22 DIAGNOSIS — Z955 Presence of coronary angioplasty implant and graft: Secondary | ICD-10-CM

## 2016-07-22 DIAGNOSIS — E1122 Type 2 diabetes mellitus with diabetic chronic kidney disease: Secondary | ICD-10-CM | POA: Diagnosis present

## 2016-07-22 DIAGNOSIS — F1010 Alcohol abuse, uncomplicated: Secondary | ICD-10-CM | POA: Diagnosis present

## 2016-07-22 DIAGNOSIS — I255 Ischemic cardiomyopathy: Secondary | ICD-10-CM | POA: Diagnosis present

## 2016-07-22 DIAGNOSIS — Z8673 Personal history of transient ischemic attack (TIA), and cerebral infarction without residual deficits: Secondary | ICD-10-CM

## 2016-07-22 DIAGNOSIS — N189 Chronic kidney disease, unspecified: Secondary | ICD-10-CM

## 2016-07-22 DIAGNOSIS — F17210 Nicotine dependence, cigarettes, uncomplicated: Secondary | ICD-10-CM | POA: Diagnosis present

## 2016-07-22 DIAGNOSIS — Z794 Long term (current) use of insulin: Secondary | ICD-10-CM | POA: Diagnosis not present

## 2016-07-22 DIAGNOSIS — I248 Other forms of acute ischemic heart disease: Secondary | ICD-10-CM | POA: Diagnosis present

## 2016-07-22 DIAGNOSIS — Z9861 Coronary angioplasty status: Secondary | ICD-10-CM

## 2016-07-22 DIAGNOSIS — I10 Essential (primary) hypertension: Secondary | ICD-10-CM | POA: Diagnosis present

## 2016-07-22 DIAGNOSIS — Z923 Personal history of irradiation: Secondary | ICD-10-CM

## 2016-07-22 DIAGNOSIS — Z8546 Personal history of malignant neoplasm of prostate: Secondary | ICD-10-CM

## 2016-07-22 DIAGNOSIS — R0602 Shortness of breath: Secondary | ICD-10-CM | POA: Diagnosis present

## 2016-07-22 DIAGNOSIS — Z7902 Long term (current) use of antithrombotics/antiplatelets: Secondary | ICD-10-CM

## 2016-07-22 HISTORY — DX: Gastrointestinal hemorrhage, unspecified: K92.2

## 2016-07-22 LAB — CBC WITH DIFFERENTIAL/PLATELET
BASOS ABS: 0 10*3/uL (ref 0.0–0.1)
BASOS PCT: 1 %
EOS ABS: 0.1 10*3/uL (ref 0.0–0.7)
Eosinophils Relative: 2 %
HCT: 37.2 % — ABNORMAL LOW (ref 39.0–52.0)
HEMOGLOBIN: 11.6 g/dL — AB (ref 13.0–17.0)
Lymphocytes Relative: 18 %
Lymphs Abs: 1.1 10*3/uL (ref 0.7–4.0)
MCH: 26.4 pg (ref 26.0–34.0)
MCHC: 31.2 g/dL (ref 30.0–36.0)
MCV: 84.5 fL (ref 78.0–100.0)
MONO ABS: 0.8 10*3/uL (ref 0.1–1.0)
MONOS PCT: 13 %
NEUTROS PCT: 66 %
Neutro Abs: 4 10*3/uL (ref 1.7–7.7)
PLATELETS: 244 10*3/uL (ref 150–400)
RBC: 4.4 MIL/uL (ref 4.22–5.81)
RDW: 16.9 % — ABNORMAL HIGH (ref 11.5–15.5)
WBC: 6.1 10*3/uL (ref 4.0–10.5)

## 2016-07-22 LAB — BASIC METABOLIC PANEL
Anion gap: 9 (ref 5–15)
BUN: 63 mg/dL — ABNORMAL HIGH (ref 6–20)
CALCIUM: 9.2 mg/dL (ref 8.9–10.3)
CO2: 26 mmol/L (ref 22–32)
CREATININE: 3.29 mg/dL — AB (ref 0.61–1.24)
Chloride: 101 mmol/L (ref 101–111)
GFR, EST AFRICAN AMERICAN: 19 mL/min — AB (ref >60)
GFR, EST NON AFRICAN AMERICAN: 17 mL/min — AB (ref >60)
GLUCOSE: 214 mg/dL — AB (ref 65–99)
Potassium: 4.7 mmol/L (ref 3.5–5.1)
Sodium: 136 mmol/L (ref 135–145)

## 2016-07-22 LAB — GLUCOSE, CAPILLARY
Glucose-Capillary: 272 mg/dL — ABNORMAL HIGH (ref 65–99)
Glucose-Capillary: 351 mg/dL — ABNORMAL HIGH (ref 65–99)

## 2016-07-22 LAB — CREATININE, SERUM
CREATININE: 3.27 mg/dL — AB (ref 0.61–1.24)
GFR calc Af Amer: 19 mL/min — ABNORMAL LOW (ref >60)
GFR, EST NON AFRICAN AMERICAN: 17 mL/min — AB (ref >60)

## 2016-07-22 LAB — CBC
HCT: 35.3 % — ABNORMAL LOW (ref 39.0–52.0)
Hemoglobin: 11.1 g/dL — ABNORMAL LOW (ref 13.0–17.0)
MCH: 26.6 pg (ref 26.0–34.0)
MCHC: 31.4 g/dL (ref 30.0–36.0)
MCV: 84.7 fL (ref 78.0–100.0)
Platelets: 228 10*3/uL (ref 150–400)
RBC: 4.17 MIL/uL — ABNORMAL LOW (ref 4.22–5.81)
RDW: 16.9 % — AB (ref 11.5–15.5)
WBC: 5.4 10*3/uL (ref 4.0–10.5)

## 2016-07-22 LAB — I-STAT TROPONIN, ED: TROPONIN I, POC: 0.09 ng/mL — AB (ref 0.00–0.08)

## 2016-07-22 LAB — BRAIN NATRIURETIC PEPTIDE: B Natriuretic Peptide: 1454.7 pg/mL — ABNORMAL HIGH (ref 0.0–100.0)

## 2016-07-22 LAB — TROPONIN I: Troponin I: 0.06 ng/mL (ref <0.03)

## 2016-07-22 MED ORDER — NYSTATIN 100000 UNIT/ML MT SUSP: 5.0000 mL | Freq: Four times a day (QID) | OROMUCOSAL | Status: DC

## 2016-07-22 MED ORDER — SODIUM CHLORIDE 0.9% FLUSH
3.0000 mL | Freq: Two times a day (BID) | INTRAVENOUS | Status: DC
  Administered 2016-07-23 – 2016-07-24 (×2): 3 mL via INTRAVENOUS

## 2016-07-22 MED ORDER — HEPARIN SODIUM (PORCINE) 5000 UNIT/ML IJ SOLN: 5000.0000 [IU] | Freq: Three times a day (TID) | INTRAMUSCULAR | Status: DC

## 2016-07-22 MED ORDER — GABAPENTIN 300 MG PO CAPS
300.0000 mg | ORAL_CAPSULE | Freq: Two times a day (BID) | ORAL | Status: DC
  Administered 2016-07-22 – 2016-07-25 (×6): 300 mg via ORAL
  Filled 2016-07-22 (×6): qty 1

## 2016-07-22 MED ORDER — FUROSEMIDE 10 MG/ML IJ SOLN
60.0000 mg | Freq: Two times a day (BID) | INTRAMUSCULAR | Status: DC
  Administered 2016-07-22 – 2016-07-23 (×2): 60 mg via INTRAVENOUS
  Filled 2016-07-22 (×2): qty 6

## 2016-07-22 MED ORDER — GLUCERNA SHAKE PO LIQD
237.0000 mL | Freq: Three times a day (TID) | ORAL | Status: DC
  Administered 2016-07-23 – 2016-07-24 (×6): 237 mL via ORAL

## 2016-07-22 MED ORDER — ACETAMINOPHEN 650 MG RE SUPP: 650.0000 mg | Freq: Four times a day (QID) | RECTAL | Status: DC | PRN

## 2016-07-22 MED ORDER — ONDANSETRON HCL 4 MG/2ML IJ SOLN: 4.0000 mg | Freq: Four times a day (QID) | INTRAMUSCULAR | Status: DC | PRN

## 2016-07-22 MED ORDER — TAMSULOSIN HCL 0.4 MG PO CAPS
0.4000 mg | ORAL_CAPSULE | Freq: Every day | ORAL | Status: DC
  Administered 2016-07-23 – 2016-07-25 (×3): 0.4 mg via ORAL
  Filled 2016-07-22 (×3): qty 1

## 2016-07-22 MED ORDER — FINASTERIDE 5 MG PO TABS
5.0000 mg | ORAL_TABLET | Freq: Every day | ORAL | Status: DC
  Administered 2016-07-23 – 2016-07-25 (×3): 5 mg via ORAL
  Filled 2016-07-22 (×3): qty 1

## 2016-07-22 MED ORDER — ACETAMINOPHEN 325 MG PO TABS: 650.0000 mg | ORAL_TABLET | Freq: Four times a day (QID) | ORAL | Status: DC | PRN

## 2016-07-22 MED ORDER — ALBUTEROL SULFATE (2.5 MG/3ML) 0.083% IN NEBU
2.5000 mg | INHALATION_SOLUTION | Freq: Four times a day (QID) | RESPIRATORY_TRACT | Status: DC | PRN
  Administered 2016-07-23: 2.5 mg via RESPIRATORY_TRACT
  Filled 2016-07-22: qty 3

## 2016-07-22 MED ORDER — INSULIN GLARGINE 100 UNIT/ML ~~LOC~~ SOLN
20.0000 [IU] | Freq: Every day | SUBCUTANEOUS | Status: DC
  Administered 2016-07-22: 20 [IU] via SUBCUTANEOUS
  Filled 2016-07-22 (×2): qty 0.2

## 2016-07-22 MED ORDER — CILOSTAZOL 50 MG PO TABS
50.0000 mg | ORAL_TABLET | Freq: Two times a day (BID) | ORAL | Status: DC
  Administered 2016-07-22 – 2016-07-23 (×2): 50 mg via ORAL
  Filled 2016-07-22 (×2): qty 1

## 2016-07-22 MED ORDER — PANTOPRAZOLE SODIUM 40 MG PO TBEC
40.0000 mg | DELAYED_RELEASE_TABLET | Freq: Every day | ORAL | Status: DC
  Administered 2016-07-23 – 2016-07-25 (×3): 40 mg via ORAL
  Filled 2016-07-22 (×3): qty 1

## 2016-07-22 MED ORDER — ATORVASTATIN CALCIUM 20 MG PO TABS
20.0000 mg | ORAL_TABLET | Freq: Every day | ORAL | Status: DC
  Administered 2016-07-22 – 2016-07-24 (×3): 20 mg via ORAL
  Filled 2016-07-22 (×3): qty 1

## 2016-07-22 MED ORDER — VITAMIN B-12 1000 MCG PO TABS
1000.0000 ug | ORAL_TABLET | Freq: Every day | ORAL | Status: DC
  Administered 2016-07-23 – 2016-07-25 (×3): 1000 ug via ORAL
  Filled 2016-07-22 (×3): qty 1

## 2016-07-22 MED ORDER — INSULIN ASPART 100 UNIT/ML ~~LOC~~ SOLN
0.0000 [IU] | Freq: Every day | SUBCUTANEOUS | Status: DC
  Administered 2016-07-22: 5 [IU] via SUBCUTANEOUS

## 2016-07-22 MED ORDER — ALBUTEROL SULFATE HFA 108 (90 BASE) MCG/ACT IN AERS: 2.0000 | INHALATION_SPRAY | Freq: Four times a day (QID) | RESPIRATORY_TRACT | Status: DC | PRN

## 2016-07-22 MED ORDER — APIXABAN 2.5 MG PO TABS
2.5000 mg | ORAL_TABLET | Freq: Two times a day (BID) | ORAL | Status: DC
  Administered 2016-07-22 – 2016-07-25 (×6): 2.5 mg via ORAL
  Filled 2016-07-22 (×6): qty 1

## 2016-07-22 MED ORDER — ONDANSETRON HCL 4 MG PO TABS: 4.0000 mg | ORAL_TABLET | Freq: Four times a day (QID) | ORAL | Status: DC | PRN

## 2016-07-22 MED ORDER — SIMETHICONE 80 MG PO CHEW: 80.0000 mg | CHEWABLE_TABLET | Freq: Three times a day (TID) | ORAL | Status: DC | PRN

## 2016-07-22 MED ORDER — INSULIN ASPART 100 UNIT/ML ~~LOC~~ SOLN: 0.0000 [IU] | Freq: Three times a day (TID) | SUBCUTANEOUS | Status: DC

## 2016-07-22 MED ORDER — GUAIFENESIN 200 MG PO TABS
400.0000 mg | ORAL_TABLET | Freq: Four times a day (QID) | ORAL | Status: DC | PRN
  Filled 2016-07-22: qty 2

## 2016-07-22 MED ORDER — METOPROLOL TARTRATE 50 MG PO TABS
50.0000 mg | ORAL_TABLET | Freq: Two times a day (BID) | ORAL | Status: DC
  Administered 2016-07-22 – 2016-07-23 (×2): 50 mg via ORAL
  Filled 2016-07-22 (×2): qty 1

## 2016-07-22 NOTE — ED Triage Notes (Signed)
Pt presents with onset of shortness of breath that worsened last night.  Pt was discharged last Friday for same; reports productive cough with white phlegm.  Pt reports lasix was decreased since discharge, reports swelling to both legs.

## 2016-07-22 NOTE — ED Provider Notes (Signed)
[facility] DEPT Provider Note   CSN: [id number] Arrival date & time: 07/22/16  1311  First Provider Contact:  None       History   Chief Complaint Chief Complaint  Patient presents with  . Shortness of Breath    HPI [name] is a 80 y.o. male with history of coronary artery disease with stenting, A. fib, CHF, COPD, here for evaluation of shortness of breath or patient reports he was just discharged from the hospital last week for shortness of breath, felt like he was doing well. However, woke up this morning at 2:00 AM profoundly short of breath. He reports this lasted for "a few hours" and gradually started to resolve. He denies any associated chest pain, nausea or vomiting, diaphoresis, other numbness or weakness. He reports he felt like it 11:00 AM he was breathing much better, but was urged to come to the ED by his pharmacist and doctor's nurse. No fevers, chills, unusual leg swelling, abdominal pain, urinary or bowel symptoms.  HPI  Past Medical History:  Diagnosis Date  . Anemia 11/28/2015  . Atrial fibrillation ([place])   . Bipolar disorder ([place])   . CAD (coronary artery disease)    a.  NSTEMI treated with PCI Feb 2011 with a DES.([facility]);   b. cath 2/11: D1 stents x 2 ok, AV groove CFX occluded with dist AV CFX filled by L-L collats, RCA occluded, OM2 90-95% (treated with PCI)  . CKD (chronic kidney disease), stage III   . DM (diabetes mellitus) ([place])   . GERD (gastroesophageal reflux disease)   . Heart failure ([place])   . HTN (hypertension)    echo 2/11: EF 50-55%, diast dyfxn, severe LVH, inf HK, LAE  . Hyperlipidemia   . Hypertensive emergency 06/24/2014  . MI (myocardial infarction) ([place])   . Prostate cancer [facility])    status post radiation therapy in 2003  . Pulmonary edema   . PVD (peripheral vascular disease) ([place])   . Respiratory difficulty 06/24/2014   intubated   . Shortness of breath dyspnea   . Stroke [facility]) 07/2014    Patient Active Problem  List   Diagnosis Date Noted  . CHF exacerbation ([place]) 07/22/2016  . Acute on chronic heart failure ([place]) 07/13/2016  . Acute on chronic systolic congestive heart failure ([place])   . Alcohol abuse 07/09/2016  . Hypoglycemia 07/08/2016  . Demand ischemia ([place]) 05/11/2016  . Chest pain with high risk of acute coronary syndrome 05/11/2016  . Chronic anticoagulation-Eliquis 05/11/2016  . Elevated troponin 05/11/2016  . Acute blood loss anemia 05/11/2016  . Acute on chronic kidney failure ([place]) 05/11/2016  . Symptomatic anemia 05/11/2016  . Hypoglycemia due to insulin   . Acute respiratory distress (H[facility])   . Perineal abscess, superficial   . Melena   . GI bleed-recent SB study-AVMs   . GI bleed 12/23/2015  . Anemia 11/28/2015  . GIB (gastrointestinal bleeding) 11/28/2015  . CVA (cerebral infarction)-Augb 2015 08/25/2014  . Stroke ([place]) 08/25/2014  . Acute on chronic diastolic congestive heart failure ([place]) 07/17/2014  . CKD (chronic kidney disease), stage III 07/17/2014  . Acute respiratory failure with hypoxemia ([place]) 07/16/2014  . Tobacco abuse 06/27/2014  . Diastolic CHF, chronic ([place]) 06/27/2014  . Pulmonary edema   . Chronic atrial fibrillation ([place])   . Acute respiratory failure ([place]) 06/24/2014  . Edema 08/20/2011  . Chest pain, unspecified 04/20/2011  . DM (diabetes mellitus), type 2 with renal complications ([place]) 89/38/1017  . Hyperlipidemia associated  with type 2 diabetes mellitus ([place]) 03/10/2010  . Essential hypertension 03/10/2010  . MYOCARDIAL INFARCTION 03/10/2010  . CAD- last PCI 2011 03/10/2010  . PVD 03/10/2010  . GERD 03/10/2010    Past Surgical History:  Procedure Laterality Date  . COLONOSCOPY N/A 01/25/2016   Procedure: COLONOSCOPY;  Surgeon: Ronald Lobo, MD;  Location: [facility] ENDOSCOPY;  Service: Endoscopy;  Laterality: N/A;  . ESOPHAGOGASTRODUODENOSCOPY N/A 11/29/2015   Procedure: ESOPHAGOGASTRODUODENOSCOPY (EGD);  Surgeon: Wonda Horner, MD;  Location[facility]  ENDOSCOPY;  Service: Endoscopy;  Laterality: N/A;  . ESOPHAGOGASTRODUODENOSCOPY N/A 05/13/2016   Procedure: ESOPHAGOGASTRODUODENOSCOPY (EGD);  Surgeon: Clarene Essex, MD;  Location: [facility] ENDOSCOPY;  Service: Endoscopy;  Laterality: N/A;  . ESOPHAGOGASTRODUODENOSCOPY (EGD) WITH PROPOFOL Left 12/25/2015   Procedure: ESOPHAGOGASTRODUODENOSCOPY (EGD) WITH PROPOFOL;  Surgeon: Arta Silence, MD;  Location: [facility] ENDOSCOPY;  Service: Endoscopy;  Laterality: Left;  . FEMORAL BYPASS  2003  . GIVENS CAPSULE STUDY N/A 01/26/2016   Procedure: GIVENS CAPSULE STUDY;  Surgeon: Ronald Lobo, MD;  Location: [facility] ENDOSCOPY;  Service: Endoscopy;  Laterality: N/A;  . HOT HEMOSTASIS N/A 05/13/2016   Procedure: HOT HEMOSTASIS (ARGON PLASMA COAGULATION/BICAP);  Surgeon: Clarene Essex, MD;  Location: [facility] ENDOSCOPY;  Service: Endoscopy;  Laterality: N/A;       Home Medications    Prior to Admission medications   Medication Sig Start Date End Date Taking? Authorizing Provider  acetaminophen (TYLENOL) 325 MG tablet Take 650 mg by mouth every 8 (eight) hours as needed for mild pain or fever (pain).     Historical Provider, MD  albuterol (PROVENTIL HFA;VENTOLIN HFA) 108 (90 BASE) MCG/ACT inhaler Inhale 2 puffs into the lungs every 6 (six) hours as needed for shortness of breath.    Historical Provider, MD  ammonium lactate (LAC-HYDRIN) 12 % lotion Apply 1 application topically daily.    Historical Provider, MD  apixaban (ELIQUIS) 2.5 MG TABS tablet Take 2.5 mg by mouth every 12 (twelve) hours.    Historical Provider, MD  atorvastatin (LIPITOR) 40 MG tablet Take 1 tablet (40 mg total) by mouth daily. Patient taking differently: Take 20 mg by mouth at bedtime.  08/01/14   Sherren Mocha, MD  cilostazol (PLETAL) 50 MG tablet Take 50 mg by mouth every 12 (twelve) hours.     Historical Provider, MD  dextrose (GLUTOSE) 40 % GEL Take 1 Tube by mouth daily as needed for low blood sugar.     Historical Provider, MD  feeding supplement,  GLUCERNA SHAKE, (GLUCERNA SHAKE) LIQD Take 237 mLs by mouth 3 (three) times daily between meals.    Historical Provider, MD  finasteride (PROSCAR) 5 MG tablet Take 5 mg by mouth daily.      Historical Provider, MD  furosemide (LASIX) 40 MG tablet Take 1.5 tablets (60 mg total) by mouth daily. 07/17/16   Hosie Poisson, MD  gabapentin (NEURONTIN) 300 MG capsule Take 300 mg by mouth 2 (two) times daily.     Historical Provider, MD  guaifenesin (HUMIBID E) 400 MG TABS tablet Take 400 mg by mouth 3 (three) times daily as needed. For congestion    Historical Provider, MD  insulin glargine (LANTUS) 100 UNIT/ML injection Inject 40 Units into the skin daily.    Historical Provider, MD  insulin regular (NOVOLIN R,HUMULIN R) 100 units/mL injection Inject 5 Units into the skin See admin instructions. Inject 5 units under the skin before breakfast and inject 5 units before evening meal    Historical Provider, MD  levofloxacin (LEVAQUIN) 500 MG tablet Take  1 tablet (500 mg total) by mouth every other day. 07/18/16   Hosie Poisson, MD  metoprolol (LOPRESSOR) 50 MG tablet Take 50 mg by mouth 2 (two) times daily.    Historical Provider, MD  Multiple Vitamins-Minerals (MULTIVITAMIN WITH MINERALS) tablet Take 1 tablet by mouth daily.    Historical Provider, MD  nystatin (MYCOSTATIN) 100000 UNIT/ML suspension Take 5 mLs (500,000 Units total) by mouth 4 (four) times daily. 11/29/15   Alexa Angela Burke, MD  pantoprazole (PROTONIX) 40 MG tablet Take 1 tablet (40 mg total) by mouth daily. 11/29/15   Florinda Marker, MD  simethicone (MYLICON) 80 MG chewable tablet Chew 80 mg by mouth 3 (three) times daily as needed for flatulence. Take with meals as needed    Historical Provider, MD  Tamsulosin HCl (FLOMAX) 0.4 MG CAPS Take 0.4 mg by mouth daily.      Historical Provider, MD  vitamin B-12 (CYANOCOBALAMIN) 1000 MCG tablet Take 1,000 mcg by mouth daily.    Historical Provider, MD    Family History Family History  Problem Relation Age  of Onset  . Cancer Father   . Cancer Mother   . Cancer Sister     Social History Social History  Substance Use Topics  . Smoking status: Current Every Day Smoker    Packs/day: 0.50    Years: 50.00    Types: Cigarettes  . Smokeless tobacco: Never Used     Comment: He has a 50-pack-year hx of tobacco abuse. Currently, smoking about half a pack a day.  . Alcohol use Yes     Comment: Previously drank heavily, and now has a drink every other day or so.     Allergies   Penicillins   Review of Systems Review of Systems A 10 point review of systems was completed and was negative except for pertinent positives and negatives as mentioned in the history of present illness    Physical Exam Updated Vital Signs BP 125/82   Pulse 72   Temp 97.7 F (36.5 C) (Oral)   Resp 22   Ht 6' (1.829 m)   Wt 84.8 kg   SpO2 93%   BMI 25.36 kg/m   Physical Exam  Constitutional: He appears well-developed. No distress.  Awake, alert and nontoxic in appearance. He is somewhat somnolent.  HENT:  Head: Normocephalic and atraumatic.  Right Ear: External ear normal.  Left Ear: External ear normal.  Mouth/Throat: Oropharynx is clear and moist.  Eyes: Conjunctivae and EOM are normal. Pupils are equal, round, and reactive to light.  Neck: Normal range of motion. No JVD present.  Cardiovascular: Normal rate, regular rhythm and normal heart sounds.   Pulmonary/Chest: Effort normal. No stridor.  Diminished breath sounds in all fields  Abdominal: Soft. There is no tenderness.  Musculoskeletal: Normal range of motion. He exhibits edema.  2+ pitting edema bilateral shins. It is baseline for patient  Neurological:  Awake, alert, cooperative and aware of situation; motor strength bilaterally; sensation normal to light touch bilaterally; no facial asymmetry; tongue midline; major cranial nerves appear intact;  baseline gait without new ataxia.  Skin: No rash noted. He is not diaphoretic.  Psychiatric: He  has a normal mood and affect. His behavior is normal. Thought content normal.  Nursing note and vitals reviewed.    ED Treatments / Results  Labs (all labs ordered are listed, but only abnormal results are displayed) Labs Reviewed  BASIC METABOLIC PANEL - Abnormal; Notable for the following:  Result Value   Glucose, Bld 214 (*)    BUN 63 (*)    Creatinine, Ser 3.29 (*)    GFR calc non Af Amer 17 (*)    GFR calc Af Amer 19 (*)    All other components within normal limits  CBC WITH DIFFERENTIAL/PLATELET - Abnormal; Notable for the following:    Hemoglobin 11.6 (*)    HCT 37.2 (*)    RDW 16.9 (*)    All other components within normal limits  BRAIN NATRIURETIC PEPTIDE - Abnormal; Notable for the following:    B Natriuretic Peptide 1,454.7 (*)    All other components within normal limits  I-STAT TROPOININ, ED - Abnormal; Notable for the following:    Troponin i, poc 0.09 (*)    All other components within normal limits    EKG  EKG Interpretation  Date/Time:  Wednesday July 22 2016 13:33:57 EDT Ventricular Rate:  84 PR Interval:    QRS Duration: 155 QT Interval:  411 QTC Calculation: 486 R Axis:   -165 Text Interpretation:  Atrial fibrillation Right bundle branch block Confirmed by Wilson Singer  MD, STEPHEN ([id number]) on 07/22/2016 2:52:15 PM       Radiology Dg Chest 2 View  Result Date: 07/22/2016 CLINICAL DATA:  Shortness of breath. EXAM: CHEST  2 VIEW COMPARISON:  07/22/2016. FINDINGS: The cardio pericardial silhouette is enlarged. Interstitial markings are diffusely coarsened with chronic features. Patchy airspace opacities seen at the lung bases, right greater than left. High right paratracheal opacity may reflect ectasia of the arch vessel anatomy. IMPRESSION: Low volume film with cardiomegaly and features suggesting interstitial and basilar alveolar edema. Electronically Signed   By: Misty Stanley M.D.   On: 07/22/2016 15:07   Procedures Procedures (including critical  care time)  Medications Ordered in ED Medications - No data to display   Initial Impression / Assessment and Plan / ED Course  I have reviewed the triage vital signs and the nursing notes.  Pertinent labs & imaging results that were available during my care of the patient were reviewed by me and considered in my medical decision making (see chart for details).  Clinical Course   Patient with history of CHF, CAD, recently admitted 1 week ago for CHF exacerbation here for evaluation of shortness of breath. Symptoms have been rather sudden onset last evening and persisted for several hours. On arrival, he is hemodynamically stable and afebrile. He is somewhat somnolent during exam, his breath sounds are diminished. Screening labs are significant for an elevated troponin of 0.09, BNP is elevated at 1450. Creatinine elevated at 3.29, blood patient's baseline of 2. His chest x-ray does show signs concerning for pulmonary edema. Discussed with my attending, Dr. Wilson Singer. Patient would benefit from medical admission for further evaluation and management of his likely CHF exacerbation. Discussed with hospitalist service, Levi Aland, patient to be admitted to medical service.   Final Clinical Impressions(s) / ED Diagnoses   Final diagnoses:  Acute on chronic congestive heart failure, unspecified congestive heart failure type [facility])    New Prescriptions New Prescriptions   No medications on file     Comer Locket, PA-C 07/22/16 [place]    Virgel Manifold, MD 07/25/16 1452

## 2016-07-22 NOTE — ED Notes (Signed)
Condom catheter placed at pt's request-- pt also incontinent of stool. Encouraged pt to call for bedpan.

## 2016-07-22 NOTE — ED Notes (Signed)
report given to Janett Billow, RN on [place]

## 2016-07-22 NOTE — H&P (Signed)
History and Physical    KAIMANI CLAYSON UUV:[id number] DOB: [birth year] DOA: 07/22/2016  PCP: Beacher May, MD Patient coming from: home  Chief Complaint: sob  HPI: [name] is a very pleasant 80 y.o. male with medical history significant CAD, heart failure, hypertension, diabetes, A. fib on anticoagulation with a history GI bleed, prostate cancer, history of stroke presents to the emergency Department chief complaint sudden onset shortness of breath. Initial evaluation reveals acute on chronic heart failure as well as acute on chronic kidney disease.  Information is obtained from the patient. He was discharged from the hospital 5 days ago after a 4 day stay for acute respiratory failure related to acute on chronic heart failure in the setting of acute on chronic renal failure. Chart review indicates he diuresed over 5 L and was sent home on 60 mg of Lasix. Patient reports he was on 80 mg prior to that hospitalization. He states he was doing "quite well" until last night when he was awakened suddenly with sudden shortness of breath. He denies chest pain palpitations lower extremity edema. He does endorse worsening orthopnea. He states his morning wore on he felt like he was improving around 11 AM was encouraged to come to the emergency department. He has not home oxygen but states "I was offered it at discharge last time and turned it down as I felt better". He denies headache visual disturbances syncope or near-syncope. He denies abdominal pain nausea vomiting diarrhea constipation melena bright red blood per rectum. He denies dysuria hematuria frequency or urgency. Does endorse a chronic productive cough that he's had "for 4 months since I got a bad cold and the cough never went away".    ED Course: Emergency department he's afebrile hemodynamically stable and not hypoxic  Review of Systems: As per HPI otherwise 10 point review of systems negative.   Ambulatory Status: ambulates  with a cane with a steady gait reports no recent falls  Past Medical History:  Diagnosis Date  . Anemia 11/28/2015  . Atrial fibrillation ([place])   . Bipolar disorder ([place])   . CAD (coronary artery disease)    a.  NSTEMI treated with PCI Feb 2011 with a DES.([facility]);   b. cath 2/11: D1 stents x 2 ok, AV groove CFX occluded with dist AV CFX filled by L-L collats, RCA occluded, OM2 90-95% (treated with PCI)  . CKD (chronic kidney disease), stage III   . DM (diabetes mellitus) ([place])   . GERD (gastroesophageal reflux disease)   . Heart failure ([place])   . HTN (hypertension)    echo 2/11: EF 50-55%, diast dyfxn, severe LVH, inf HK, LAE  . Hyperlipidemia   . Hypertensive emergency 06/24/2014  . MI (myocardial infarction) ([place])   . Prostate cancer [facility])    status post radiation therapy in 2003  . Pulmonary edema   . PVD (peripheral vascular disease) ([place])   . Respiratory difficulty 06/24/2014   intubated   . Shortness of breath dyspnea   . Stroke [facility]) 07/2014    Past Surgical History:  Procedure Laterality Date  . COLONOSCOPY N/A 01/25/2016   Procedure: COLONOSCOPY;  Surgeon: Ronald Lobo, MD;  Location: [facility] ENDOSCOPY;  Service: Endoscopy;  Laterality: N/A;  . ESOPHAGOGASTRODUODENOSCOPY N/A 11/29/2015   Procedure: ESOPHAGOGASTRODUODENOSCOPY (EGD);  Surgeon: Wonda Horner, MD;  Location: [facility] ENDOSCOPY;  Service: Endoscopy;  Laterality: N/A;  . ESOPHAGOGASTRODUODENOSCOPY N/A 05/13/2016   Procedure: ESOPHAGOGASTRODUODENOSCOPY (EGD);  Surgeon: Clarene Essex, MD;  Location: [place];  Service:  Endoscopy;  Laterality: N/A;  . ESOPHAGOGASTRODUODENOSCOPY (EGD) WITH PROPOFOL Left 12/25/2015   Procedure: ESOPHAGOGASTRODUODENOSCOPY (EGD) WITH PROPOFOL;  Surgeon: Arta Silence, MD;  Location: [facility] ENDOSCOPY;  Service: Endoscopy;  Laterality: Left;  . FEMORAL BYPASS  2003  . GIVENS CAPSULE STUDY N/A 01/26/2016   Procedure: GIVENS CAPSULE STUDY;  Surgeon: Ronald Lobo, MD;  Location: [facility] ENDOSCOPY;   Service: Endoscopy;  Laterality: N/A;  . HOT HEMOSTASIS N/A 05/13/2016   Procedure: HOT HEMOSTASIS (ARGON PLASMA COAGULATION/BICAP);  Surgeon: Clarene Essex, MD;  Location: [facility] ENDOSCOPY;  Service: Endoscopy;  Laterality: N/A;    Social History   Social History  . Marital status: Divorced    Spouse name: N/A  . Number of children: N/A  . Years of education: N/A   Occupational History  . retired Architect    Social History Main Topics  . Smoking status: Current Every Day Smoker    Packs/day: 0.50    Years: 50.00    Types: Cigarettes  . Smokeless tobacco: Never Used     Comment: He has a 50-pack-year hx of tobacco abuse. Currently, smoking about half a pack a day.  . Alcohol use Yes     Comment: Previously drank heavily, and now has a drink every other day or so.  . Drug use:     Types: Marijuana, Cocaine  . Sexual activity: Not on file   Other Topics Concern  . Not on file   Social History Narrative  . No narrative on file    Allergies  Allergen Reactions  . Penicillins Swelling    Has patient had a PCN reaction causing immediate rash, facial/tongue/throat swelling, SOB or lightheadedness with hypotension: Yes Has patient had a PCN reaction causing severe rash involving mucus membranes or skin necrosis: No Has patient had a PCN reaction that required hospitalization: No Has patient had a PCN reaction occurring within the last 10 years: No If all of the above answers are "NO", then may proceed with Cephalosporin use.    Family History  Problem Relation Age of Onset  . Cancer Father   . Cancer Mother   . Cancer Sister     Prior to Admission medications   Medication Sig Start Date End Date Taking? Authorizing Provider  acetaminophen (TYLENOL) 325 MG tablet Take 650 mg by mouth every 8 (eight) hours as needed for mild pain or fever (pain).    Yes Historical Provider, MD  albuterol (PROVENTIL HFA;VENTOLIN HFA) 108 (90 BASE) MCG/ACT inhaler Inhale 2 puffs into the lungs  every 6 (six) hours as needed for shortness of breath.   Yes Historical Provider, MD  ammonium lactate (LAC-HYDRIN) 12 % lotion Apply 1 application topically daily.   Yes Historical Provider, MD  apixaban (ELIQUIS) 2.5 MG TABS tablet Take 2.5 mg by mouth every 12 (twelve) hours.   Yes Historical Provider, MD  atorvastatin (LIPITOR) 40 MG tablet Take 1 tablet (40 mg total) by mouth daily. Patient taking differently: Take 20 mg by mouth at bedtime.  08/01/14  Yes Sherren Mocha, MD  cilostazol (PLETAL) 50 MG tablet Take 50 mg by mouth every 12 (twelve) hours.    Yes Historical Provider, MD  dextrose (GLUTOSE) 40 % GEL Take 1 Tube by mouth daily as needed for low blood sugar.    Yes Historical Provider, MD  feeding supplement, GLUCERNA SHAKE, (GLUCERNA SHAKE) LIQD Take 237 mLs by mouth 3 (three) times daily between meals.   Yes Historical Provider, MD  finasteride (PROSCAR) 5 MG tablet Take  5 mg by mouth daily.     Yes Historical Provider, MD  furosemide (LASIX) 40 MG tablet Take 1.5 tablets (60 mg total) by mouth daily. 07/17/16  Yes Hosie Poisson, MD  gabapentin (NEURONTIN) 300 MG capsule Take 300 mg by mouth 2 (two) times daily.    Yes Historical Provider, MD  guaifenesin (HUMIBID E) 400 MG TABS tablet Take 400 mg by mouth 3 (three) times daily as needed. For congestion   Yes Historical Provider, MD  insulin glargine (LANTUS) 100 UNIT/ML injection Inject 40 Units into the skin daily.   Yes Historical Provider, MD  insulin regular (NOVOLIN R,HUMULIN R) 100 units/mL injection Inject 5 Units into the skin See admin instructions. Inject 5 units under the skin before breakfast and inject 5 units before evening meal   Yes Historical Provider, MD  metoprolol (LOPRESSOR) 50 MG tablet Take 50 mg by mouth 2 (two) times daily.   Yes Historical Provider, MD  Multiple Vitamins-Minerals (MULTIVITAMIN WITH MINERALS) tablet Take 1 tablet by mouth daily.   Yes Historical Provider, MD  pantoprazole (PROTONIX) 40 MG tablet  Take 1 tablet (40 mg total) by mouth daily. 11/29/15  Yes Alexa Angela Burke, MD  simethicone (MYLICON) 80 MG chewable tablet Chew 80 mg by mouth 3 (three) times daily as needed for flatulence. Take with meals as needed   Yes Historical Provider, MD  Tamsulosin HCl (FLOMAX) 0.4 MG CAPS Take 0.4 mg by mouth daily.     Yes Historical Provider, MD  vitamin B-12 (CYANOCOBALAMIN) 1000 MCG tablet Take 1,000 mcg by mouth daily.   Yes Historical Provider, MD  levofloxacin (LEVAQUIN) 500 MG tablet Take 1 tablet (500 mg total) by mouth every other day. Patient not taking: Reported on 07/22/2016 07/18/16   Hosie Poisson, MD  nystatin (MYCOSTATIN) 100000 UNIT/ML suspension Take 5 mLs (500,000 Units total) by mouth 4 (four) times daily. Patient not taking: Reported on 07/22/2016 11/29/15   Florinda Marker, MD    Physical Exam: Vitals:   07/22/16 1318  BP: 125/82  Pulse: 72  Resp: 22  Temp: 97.7 F (36.5 C)  TempSrc: Oral  SpO2: 93%  Weight: 84.8 kg (187 lb)  Height: 6' (1.829 m)     General:  Appears calm and comfortable, sitting up in bed Eyes:  PERRL, EOMI, normal lids, iris ENT:  grossly normal hearing, lips & tongue, mmm Neck:  no LAD, masses or thyromegaly Cardiovascular:  Irregularly irregular, no m/r/g. Trace LE edema.  Respiratory:  Increased work of breathing with conversation. Breath sounds are quite diminished throughout. I hear no crackles and no wheezing Abdomen:  soft, ntnd, positive bowel sounds throughout no guarding or rebounding Skin:  no rash or induration seen on limited exam Musculoskeletal:  grossly normal tone BUE/BLE, good ROM, no bony abnormality, joints without swelling/erythema Psychiatric:  grossly normal mood and affect, speech fluent and appropriate, AOx3 Neurologic:  CN 2-12 grossly intact, moves all extremities in coordinated fashion, sensation intact  Labs on Admission: I have personally reviewed following labs and imaging studies  CBC:  Recent Labs Lab 07/22/16 1340  07/22/16 1642  WBC 6.1 5.4  NEUTROABS 4.0  --   HGB 11.6* 11.1*  HCT 37.2* 35.3*  MCV 84.5 84.7  PLT 244 454   Basic Metabolic Panel:  Recent Labs Lab 07/16/16 0208 07/17/16 1038 07/17/16 1240 07/17/16 1322 07/17/16 1648 07/22/16 1340  NA 131* 136  --   --   --  136  K 3.8 3.9  --   --   --  4.7  CL 95* 96*  --   --   --  101  CO2 29 33*  --   --   --  26  GLUCOSE 306* 138*  --   --   --  214*  BUN 38* 33*  --   --   --  63*  CREATININE 2.11* 2.09*  --   --   --  3.29*  CALCIUM 8.4* 9.2  --   --   --  9.2  MG  --   --  1.1* 1.0* 2.6*  --    GFR: Estimated Creatinine Clearance: 20.6 mL/min (by C-G formula based on SCr of 3.29 mg/dL). Liver Function Tests: No results for input(s): AST, ALT, ALKPHOS, BILITOT, PROT, ALBUMIN in the last 168 hours. No results for input(s): LIPASE, AMYLASE in the last 168 hours. No results for input(s): AMMONIA in the last 168 hours. Coagulation Profile: No results for input(s): INR, PROTIME in the last 168 hours. Cardiac Enzymes: No results for input(s): CKTOTAL, CKMB, CKMBINDEX, TROPONINI in the last 168 hours. BNP (last 3 results) No results for input(s): PROBNP in the last 8760 hours. HbA1C: No results for input(s): HGBA1C in the last 72 hours. CBG:  Recent Labs Lab 07/16/16 1737 07/16/16 2206 07/17/16 0558 07/17/16 1209 07/17/16 1705  GLUCAP 238* 205* 316* 157* 133*   Lipid Profile: No results for input(s): CHOL, HDL, LDLCALC, TRIG, CHOLHDL, LDLDIRECT in the last 72 hours. Thyroid Function Tests: No results for input(s): TSH, T4TOTAL, FREET4, T3FREE, THYROIDAB in the last 72 hours. Anemia Panel: No results for input(s): VITAMINB12, FOLATE, FERRITIN, TIBC, IRON, RETICCTPCT in the last 72 hours. Urine analysis:    Component Value Date/Time   COLORURINE YELLOW 08/25/2014 1602   APPEARANCEUR CLEAR 08/25/2014 1602   LABSPEC 1.008 08/25/2014 1602   PHURINE 6.5 08/25/2014 1602   GLUCOSEU NEGATIVE 08/25/2014 1602   HGBUR  TRACE (A) 08/25/2014 1602   BILIRUBINUR NEGATIVE 08/25/2014 1602   KETONESUR NEGATIVE 08/25/2014 1602   PROTEINUR NEGATIVE 08/25/2014 1602   UROBILINOGEN 1.0 08/25/2014 1602   NITRITE NEGATIVE 08/25/2014 1602   LEUKOCYTESUR NEGATIVE 08/25/2014 1602    Creatinine Clearance: Estimated Creatinine Clearance: 20.6 mL/min (by C-G formula based on SCr of 3.29 mg/dL).  Sepsis Labs: '@LABRCNTIP'$ (procalcitonin:4,lacticidven:4) )No results found for this or any previous visit (from the past 240 hour(s)).   Radiological Exams on Admission: Dg Chest 2 View  Result Date: 07/22/2016 CLINICAL DATA:  Shortness of breath. EXAM: CHEST  2 VIEW COMPARISON:  07/22/2016. FINDINGS: The cardio pericardial silhouette is enlarged. Interstitial markings are diffusely coarsened with chronic features. Patchy airspace opacities seen at the lung bases, right greater than left. High right paratracheal opacity may reflect ectasia of the arch vessel anatomy. IMPRESSION: Low volume film with cardiomegaly and features suggesting interstitial and basilar alveolar edema. Electronically Signed   By: Misty Stanley M.D.   On: 07/22/2016 15:07   EKG: Independently reviewed A. fib with a right bundle branch block  Assessment/Plan Principal Problem:   Acute on chronic diastolic congestive heart failure (HCC) Active Problems:   DM (diabetes mellitus), type 2 with renal complications (HCC)   Hyperlipidemia associated with type 2 diabetes mellitus ([place])   Essential hypertension   CAD- last PCI 2011   GERD   Edema   Chronic atrial fibrillation (HCC)   Elevated troponin   Acute on chronic kidney failure (HCC)   CHF exacerbation ([place])   Bipolar disorder ([place])   #1. Acute on chronic diastolic heart failure.  Recently hospitalized with same. Diuresed well during the hospitalization. Went home on 60 mg of Lasix. BNP elevated, chest x-ray with low volume film with cardiomegaly and features suggesting interstitial and basilar alveolar  edema EKG without acute changes. Echo done in May of this year with an EF 30-35% Chart indicates weight on discharge 5 days ago 188 today's weight 187 pounds. Home medications include Lasix metoprolol -Admit to telemetry -IV Lasix 60 mg IV twice a day -Obtain daily weights -Monitor urine output -Oxygen supplementation as needed -Continue home beta blocker  #2. Acute on chronic kidney failure. Creatinine 3.9 on admission. Chart review indicates creatinine 2.09 on discharge 5 days ago. Likely related to Lasix in the setting of #1. -Hold nephrotoxins as able -Monitor urine output -Admit in the a.m. -If no improvement consider nephrology consult  #4. Hypertension. Controlled in the emergency department. Home medications include beta blocker and Lasix. -We'll continue home medications -Monitor  #5. Chronic A. fib. Rate controlled. [place] score 8. Patient is on now requests -Continue our close  #6. CAD/elevated troponin. Denies chest pain. I clearly related to kidney disease. EKG without acute changes. Chart review indicates STEMI 2011 and May of this year. -Cycle troponins -Monitor on telemetry -Continue home meds  #7. Diabetes. Serum glucose 214 on admission. Medications include Lantus 40 units daily -Continue Lantus at a lower dose for now as appetite unreliable -Sliding scale insulin for optimal control -Hg A1c 8.92 weeks ago  #8. Bipolar disorder. Patient reports being diagnosed with same in the past. Was on lithium but stopped that 6 months ago because "I did not see the bipolar and me". He's requesting a psych consult and some Latuda. Currently appropriate and cooperative -We'll request behavioral health consult   DVT prophylaxis: eliquis Code Status: full  Family Communication: none  Disposition Plan: home  Consults called: [place]  Admission status: inpatient   Radene Gunning MD [facility]  If 7PM-7AM, please contact night-coverage [email or web address] Password  [email or web address]  07/22/2016, 5:22 PM

## 2016-07-23 ENCOUNTER — Encounter (HOSPITAL_COMMUNITY): Payer: Self-pay | Admitting: Internal Medicine

## 2016-07-23 DIAGNOSIS — I5043 Acute on chronic combined systolic (congestive) and diastolic (congestive) heart failure: Secondary | ICD-10-CM

## 2016-07-23 DIAGNOSIS — N179 Acute kidney failure, unspecified: Secondary | ICD-10-CM

## 2016-07-23 DIAGNOSIS — N189 Chronic kidney disease, unspecified: Secondary | ICD-10-CM

## 2016-07-23 LAB — CBC
HEMATOCRIT: 32.3 % — AB (ref 39.0–52.0)
HEMOGLOBIN: 10 g/dL — AB (ref 13.0–17.0)
MCH: 25.8 pg — AB (ref 26.0–34.0)
MCHC: 31 g/dL (ref 30.0–36.0)
MCV: 83.5 fL (ref 78.0–100.0)
Platelets: 191 10*3/uL (ref 150–400)
RBC: 3.87 MIL/uL — AB (ref 4.22–5.81)
RDW: 16.8 % — ABNORMAL HIGH (ref 11.5–15.5)
WBC: 5.7 10*3/uL (ref 4.0–10.5)

## 2016-07-23 LAB — BASIC METABOLIC PANEL
ANION GAP: 6 (ref 5–15)
BUN: 66 mg/dL — ABNORMAL HIGH (ref 6–20)
CALCIUM: 8.8 mg/dL — AB (ref 8.9–10.3)
CO2: 28 mmol/L (ref 22–32)
Chloride: 99 mmol/L — ABNORMAL LOW (ref 101–111)
Creatinine, Ser: 3.09 mg/dL — ABNORMAL HIGH (ref 0.61–1.24)
GFR, EST AFRICAN AMERICAN: 21 mL/min — AB (ref >60)
GFR, EST NON AFRICAN AMERICAN: 18 mL/min — AB (ref >60)
Glucose, Bld: 293 mg/dL — ABNORMAL HIGH (ref 65–99)
POTASSIUM: 4.5 mmol/L (ref 3.5–5.1)
Sodium: 133 mmol/L — ABNORMAL LOW (ref 135–145)

## 2016-07-23 LAB — TROPONIN I: TROPONIN I: 0.06 ng/mL — AB (ref <0.03)

## 2016-07-23 LAB — GLUCOSE, CAPILLARY
GLUCOSE-CAPILLARY: 272 mg/dL — AB (ref 65–99)
GLUCOSE-CAPILLARY: 201 mg/dL — AB (ref 65–99)
GLUCOSE-CAPILLARY: 153 mg/dL — AB (ref 65–99)
Glucose-Capillary: 99 mg/dL (ref 65–99)

## 2016-07-23 MED ORDER — INSULIN ASPART 100 UNIT/ML ~~LOC~~ SOLN: 0.0000 [IU] | Freq: Every day | SUBCUTANEOUS | Status: DC

## 2016-07-23 MED ORDER — METOPROLOL SUCCINATE ER 50 MG PO TB24
50.0000 mg | ORAL_TABLET | Freq: Two times a day (BID) | ORAL | Status: DC
  Administered 2016-07-23 – 2016-07-25 (×4): 50 mg via ORAL
  Filled 2016-07-23 (×4): qty 1

## 2016-07-23 MED ORDER — INSULIN GLARGINE 100 UNIT/ML ~~LOC~~ SOLN
30.0000 [IU] | Freq: Every day | SUBCUTANEOUS | Status: DC
  Administered 2016-07-23: 30 [IU] via SUBCUTANEOUS
  Filled 2016-07-23 (×3): qty 0.3

## 2016-07-23 MED ORDER — INSULIN ASPART 100 UNIT/ML ~~LOC~~ SOLN
4.0000 [IU] | Freq: Three times a day (TID) | SUBCUTANEOUS | Status: DC
  Administered 2016-07-23 (×2): 4 [IU] via SUBCUTANEOUS

## 2016-07-23 MED ORDER — HYDRALAZINE HCL 25 MG PO TABS
12.5000 mg | ORAL_TABLET | Freq: Three times a day (TID) | ORAL | Status: DC
  Administered 2016-07-23 – 2016-07-25 (×6): 12.5 mg via ORAL
  Filled 2016-07-23 (×7): qty 1

## 2016-07-23 MED ORDER — ADULT MULTIVITAMIN W/MINERALS CH
1.0000 | ORAL_TABLET | Freq: Every day | ORAL | Status: DC
  Administered 2016-07-23 – 2016-07-25 (×3): 1 via ORAL
  Filled 2016-07-23 (×2): qty 1

## 2016-07-23 MED ORDER — LURASIDONE HCL 40 MG PO TABS: 40.0000 mg | ORAL_TABLET | Freq: Every day | ORAL | Status: DC

## 2016-07-23 MED ORDER — DIVALPROEX SODIUM 250 MG PO DR TAB
250.0000 mg | DELAYED_RELEASE_TABLET | Freq: Three times a day (TID) | ORAL | Status: DC
  Administered 2016-07-23 – 2016-07-25 (×6): 250 mg via ORAL
  Filled 2016-07-23 (×7): qty 1

## 2016-07-23 MED ORDER — INSULIN ASPART 100 UNIT/ML ~~LOC~~ SOLN
0.0000 [IU] | Freq: Three times a day (TID) | SUBCUTANEOUS | Status: DC
  Administered 2016-07-23: 5 [IU] via SUBCUTANEOUS

## 2016-07-23 MED ORDER — FUROSEMIDE 10 MG/ML IJ SOLN
80.0000 mg | Freq: Two times a day (BID) | INTRAMUSCULAR | Status: DC
  Administered 2016-07-23: 80 mg via INTRAVENOUS
  Filled 2016-07-23: qty 8

## 2016-07-23 MED ORDER — FOLIC ACID 1 MG PO TABS
1.0000 mg | ORAL_TABLET | Freq: Every day | ORAL | Status: DC
  Administered 2016-07-23 – 2016-07-25 (×3): 1 mg via ORAL
  Filled 2016-07-23 (×3): qty 1

## 2016-07-23 MED ORDER — ISOSORBIDE MONONITRATE ER 30 MG PO TB24
15.0000 mg | ORAL_TABLET | Freq: Every day | ORAL | Status: DC
  Administered 2016-07-23 – 2016-07-25 (×3): 15 mg via ORAL
  Filled 2016-07-23 (×3): qty 1

## 2016-07-23 MED ORDER — VITAMIN B-1 100 MG PO TABS
100.0000 mg | ORAL_TABLET | Freq: Every day | ORAL | Status: DC
  Administered 2016-07-23 – 2016-07-25 (×3): 100 mg via ORAL
  Filled 2016-07-23 (×3): qty 1

## 2016-07-23 MED ORDER — INSULIN ASPART 100 UNIT/ML ~~LOC~~ SOLN
0.0000 [IU] | Freq: Three times a day (TID) | SUBCUTANEOUS | Status: DC
  Administered 2016-07-23: 3 [IU] via SUBCUTANEOUS
  Administered 2016-07-23: 5 [IU] via SUBCUTANEOUS
  Administered 2016-07-24: 3 [IU] via SUBCUTANEOUS
  Administered 2016-07-24: 2 [IU] via SUBCUTANEOUS
  Administered 2016-07-24: 3 [IU] via SUBCUTANEOUS
  Administered 2016-07-25: 11 [IU] via SUBCUTANEOUS

## 2016-07-23 MED ORDER — GUAIFENESIN-DM 100-10 MG/5ML PO SYRP
5.0000 mL | ORAL_SOLUTION | ORAL | Status: DC | PRN
  Administered 2016-07-23 (×2): 5 mL via ORAL
  Filled 2016-07-23 (×2): qty 5

## 2016-07-23 MED ORDER — HYDROCODONE-ACETAMINOPHEN 5-325 MG PO TABS
1.0000 | ORAL_TABLET | Freq: Once | ORAL | Status: AC
  Administered 2016-07-23: 1 via ORAL
  Filled 2016-07-23: qty 1

## 2016-07-23 NOTE — Discharge Instructions (Addendum)
Information on my medicine - ELIQUIS (apixaban)  This medication education was reviewed with me or my healthcare representative as part of my discharge preparation.  The pharmacist that spoke with me during my hospital stay was:  Honor Loh, [facility]  Why was Eliquis prescribed for you? Eliquis was prescribed for you to reduce the risk of a blood clot forming that can cause a stroke if you have a medical condition called atrial fibrillation (a type of irregular heartbeat).  What do You need to know about Eliquis ? Take your Eliquis TWICE DAILY - one tablet in the morning and one tablet in the evening with or without food. If you have difficulty swallowing the tablet whole please discuss with your pharmacist how to take the medication safely.  Take Eliquis exactly as prescribed by your doctor and DO NOT stop taking Eliquis without talking to the doctor who prescribed the medication.  Stopping may increase your risk of developing a stroke.  Refill your prescription before you run out.  After discharge, you should have regular check-up appointments with your healthcare provider that is prescribing your Eliquis.  In the future your dose may need to be changed if your kidney function or weight changes by a significant amount or as you get older.  What do you do if you miss a dose? If you miss a dose, take it as soon as you remember on the same day and resume taking twice daily.  Do not take more than one dose of ELIQUIS at the same time to make up a missed dose.  Important Safety Information A possible side effect of Eliquis is bleeding. You should call your healthcare provider right away if you experience any of the following: ? Bleeding from an injury or your nose that does not stop. ? Unusual colored urine (red or dark brown) or unusual colored stools (red or black). ? Unusual bruising for unknown reasons. ? A serious fall or if you hit your head (even if there is no bleeding).  Some  medicines may interact with Eliquis and might increase your risk of bleeding or clotting while on Eliquis. To help avoid this, consult your healthcare provider or pharmacist prior to using any new prescription or non-prescription medications, including herbals, vitamins, non-steroidal anti-inflammatory drugs (NSAIDs) and supplements.  This website has more information on Eliquis (apixaban): http://www.eliquis.com/eliquis/home    Heart Failure Heart failure is a condition in which the heart has trouble pumping blood. This means your heart does not pump blood efficiently for your body to work well. In some cases of heart failure, fluid may back up into your lungs or you may have swelling (edema) in your lower legs. Heart failure is usually a long-term (chronic) condition. It is important for you to take good care of yourself and follow your health care provider's treatment plan. CAUSES  Some health conditions can cause heart failure. Those health conditions include:  High blood pressure (hypertension). Hypertension causes the heart muscle to work harder than normal. When pressure in the blood vessels is high, the heart needs to pump (contract) with more force in order to circulate blood throughout the body. High blood pressure eventually causes the heart to become stiff and weak.  Coronary artery disease (CAD). CAD is the buildup of cholesterol and fat (plaque) in the arteries of the heart. The blockage in the arteries deprives the heart muscle of oxygen and blood. This can cause chest pain and may lead to a heart attack. High blood pressure  can also contribute to CAD.  Heart attack (myocardial infarction). A heart attack occurs when one or more arteries in the heart become blocked. The loss of oxygen damages the muscle tissue of the heart. When this happens, part of the heart muscle dies. The injured tissue does not contract as well and weakens the heart's ability to pump blood.  Abnormal heart  valves. When the heart valves do not open and close properly, it can cause heart failure. This makes the heart muscle pump harder to keep the blood flowing.  Heart muscle disease (cardiomyopathy or myocarditis). Heart muscle disease is damage to the heart muscle from a variety of causes. These can include drug or alcohol abuse, infections, or unknown reasons. These can increase the risk of heart failure.  Lung disease. Lung disease makes the heart work harder because the lungs do not work properly. This can cause a strain on the heart, leading it to fail.  Diabetes. Diabetes increases the risk of heart failure. High blood sugar contributes to high fat (lipid) levels in the blood. Diabetes can also cause slow damage to tiny blood vessels that carry important nutrients to the heart muscle. When the heart does not get enough oxygen and food, it can cause the heart to become weak and stiff. This leads to a heart that does not contract efficiently.  Other conditions can contribute to heart failure. These include abnormal heart rhythms, thyroid problems, and low blood counts (anemia). Certain unhealthy behaviors can increase the risk of heart failure, including:  Being overweight.  Smoking or chewing tobacco.  Eating foods high in fat and cholesterol.  Abusing illicit drugs or alcohol.  Lacking physical activity. SYMPTOMS  Heart failure symptoms may vary and can be hard to detect. Symptoms may include:  Shortness of breath with activity, such as climbing stairs.  Persistent cough.  Swelling of the feet, ankles, legs, or abdomen.  Unexplained weight gain.  Difficulty breathing when lying flat (orthopnea).  Waking from sleep because of the need to sit up and get more air.  Rapid heartbeat.  Fatigue and loss of energy.  Feeling light-headed, dizzy, or close to fainting.  Loss of appetite.  Nausea.  Increased urination during the night (nocturia). DIAGNOSIS  A diagnosis of heart  failure is based on your history, symptoms, physical examination, and diagnostic tests. Diagnostic tests for heart failure may include:  Echocardiography.  Electrocardiography.  Chest X-ray.  Blood tests.  Exercise stress test.  Cardiac angiography.  Radionuclide scans. TREATMENT  Treatment is aimed at managing the symptoms of heart failure. Medicines, behavioral changes, or surgical intervention may be necessary to treat heart failure.  Medicines to help treat heart failure may include:  Angiotensin-converting enzyme (ACE) inhibitors. This type of medicine blocks the effects of a blood protein called angiotensin-converting enzyme. ACE inhibitors relax (dilate) the blood vessels and help lower blood pressure.  Angiotensin receptor blockers (ARBs). This type of medicine blocks the actions of a blood protein called angiotensin. Angiotensin receptor blockers dilate the blood vessels and help lower blood pressure.  Water pills (diuretics). Diuretics cause the kidneys to remove salt and water from the blood. The extra fluid is removed through urination. This loss of extra fluid lowers the volume of blood the heart pumps.  Beta blockers. These prevent the heart from beating too fast and improve heart muscle strength.  Digitalis. This increases the force of the heartbeat.  Healthy behavior changes include:  Obtaining and maintaining a healthy weight.  Stopping smoking or chewing  tobacco.  Eating heart-healthy foods.  Limiting or avoiding alcohol.  Stopping illicit drug use.  Physical activity as directed by your health care provider.  Surgical treatment for heart failure may include:  A procedure to open blocked arteries, repair damaged heart valves, or remove damaged heart muscle tissue.  A pacemaker to improve heart muscle function and control certain abnormal heart rhythms.  An internal cardioverter defibrillator to treat certain serious abnormal heart rhythms.  A left  ventricular assist device (LVAD) to assist the pumping ability of the heart. HOME CARE INSTRUCTIONS   Take medicines only as directed by your health care provider. Medicines are important in reducing the workload of your heart, slowing the progression of heart failure, and improving your symptoms.  Do not stop taking your medicine unless directed by your health care provider.  Do not skip any dose of medicine.  Refill your prescriptions before you run out of medicine. Your medicines are needed every day.  Engage in moderate physical activity if directed by your health care provider. Moderate physical activity can benefit some people. The elderly and people with severe heart failure should consult with a health care provider for physical activity recommendations.  Eat heart-healthy foods. Food choices should be free of trans fat and low in saturated fat, cholesterol, and salt (sodium). Healthy choices include fresh or frozen fruits and vegetables, fish, lean meats, legumes, fat-free or low-fat dairy products, and whole grain or high fiber foods. Talk to a dietitian to learn more about heart-healthy foods.  Limit sodium if directed by your health care provider. Sodium restriction may reduce symptoms of heart failure in some people. Talk to a dietitian to learn more about heart-healthy seasonings.  Use healthy cooking methods. Healthy cooking methods include roasting, grilling, broiling, baking, poaching, steaming, or stir-frying. Talk to a dietitian to learn more about healthy cooking methods.  Limit fluids if directed by your health care provider. Fluid restriction may reduce symptoms of heart failure in some people.  Weigh yourself every day. Daily weights are important in the early recognition of excess fluid. You should weigh yourself every morning after you urinate and before you eat breakfast. Wear the same amount of clothing each time you weigh yourself. Record your daily weight. Provide  your health care provider with your weight record.  Monitor and record your blood pressure if directed by your health care provider.  Check your pulse if directed by your health care provider.  Lose weight if directed by your health care provider. Weight loss may reduce symptoms of heart failure in some people.  Stop smoking or chewing tobacco. Nicotine makes your heart work harder by causing your blood vessels to constrict. Do not use nicotine gum or patches before talking to your health care provider.  Keep all follow-up visits as directed by your health care provider. This is important.  Limit alcohol intake to no more than 1 drink per day for nonpregnant women and 2 drinks per day for men. One drink equals 12 ounces of beer, 5 ounces of wine, or 1 ounces of hard liquor. Drinking more than that is harmful to your heart. Tell your health care provider if you drink alcohol several times a week. Talk with your health care provider about whether alcohol is safe for you. If your heart has already been damaged by alcohol or you have severe heart failure, drinking alcohol should be stopped completely.  Stop illicit drug use.  Stay up-to-date with immunizations. It is especially important to prevent  respiratory infections through current pneumococcal and influenza immunizations.  Manage other health conditions such as hypertension, diabetes, thyroid disease, or abnormal heart rhythms as directed by your health care provider.  Learn to manage stress.  Plan rest periods when fatigued.  Learn strategies to manage high temperatures. If the weather is extremely hot:  Avoid vigorous physical activity.  Use air conditioning or fans or seek a cooler location.  Avoid caffeine and alcohol.  Wear loose-fitting, lightweight, and light-colored clothing.  Learn strategies to manage cold temperatures. If the weather is extremely cold:  Avoid vigorous physical activity.  Layer clothes.  Wear  mittens or gloves, a hat, and a scarf when going outside.  Avoid alcohol.  Obtain ongoing education and support as needed.  Participate in or seek rehabilitation as needed to maintain or improve independence and quality of life. SEEK MEDICAL CARE IF:   You have a rapid weight gain.  You have increasing shortness of breath that is unusual for you.  You are unable to participate in your usual physical activities.  You tire easily.  You cough more than normal, especially with physical activity.  You have any or more swelling in areas such as your hands, feet, ankles, or abdomen.  You are unable to sleep because it is hard to breathe.  You feel like your heart is beating fast (palpitations).  You become dizzy or light-headed upon standing up. SEEK IMMEDIATE MEDICAL CARE IF:   You have difficulty breathing.  There is a change in mental status such as decreased alertness or difficulty with concentration.  You have a pain or discomfort in your chest.  You have an episode of fainting (syncope). MAKE SURE YOU:   Understand these instructions.  Will watch your condition.  Will get help right away if you are not doing well or get worse.   This information is not intended to replace advice given to you by your health care provider. Make sure you discuss any questions you have with your health care provider.   Document Released: 12/14/2005 Document Revised: 04/30/2015 Document Reviewed: 01/13/2013 Elsevier Interactive Patient Education [employer].

## 2016-07-23 NOTE — Progress Notes (Signed)
[facility]  Patient Status: Active (receiving services up to time of hospitalization)  [facility] is providing the following services: RN and PT  If patient discharges after hours, please call [phone].   [name] 07/23/2016, 12:36 PM

## 2016-07-23 NOTE — Progress Notes (Signed)
Inpatient Diabetes Program Recommendations  AACE/ADA: New Consensus Statement on Inpatient Glycemic Control (2015)  Target Ranges:  Prepandial:   less than 140 mg/dL      Peak postprandial:   less than 180 mg/dL (1-2 hours)      Critically ill patients:  140 - 180 mg/dL   Lab Results  Component Value Date   GLUCAP 272 (H) 07/23/2016   HGBA1C 8.9 (H) 07/09/2016    Review of Glycemic Control  Results for BUNYAN, BRIER (MRN [id number]) as of 07/23/2016 07:37  Ref. Range 07/17/2016 12:09 07/17/2016 17:05 07/22/2016 18:17 07/22/2016 21:18 07/23/2016 06:34  Glucose-Capillary Latest Ref Range: 65 - 99 mg/dL 157 (H) 133 (H) 272 (H) 351 (H) 272 (H)    Diabetes history: Type 2 Outpatient Diabetes medications: Lantus 40 units qnight, Novolin R 5 units with breakfast and supper Current orders for Inpatient glycemic control: Lantus 20 units qhs, Novolog correction 0-9 units tid, Novolog 0-5 units qhs  Inpatient Diabetes Program Recommendations:  Consider increasing Lantus to 30 units qhs and adding Novolog 3 units tid with meals- continue Novolog correction as ordered.   Gentry Fitz, RN, BA, MHA, CDE Diabetes Coordinator Inpatient Diabetes Program  [phone] (Team Pager) [phone] ([place]) 07/23/2016 7:39 AM

## 2016-07-23 NOTE — Progress Notes (Signed)
Pt's family conveying concern that the patient's frequent readmissions are atleast partially attributed to his beer drinking habits. Also, they believe his usual rush to be discharged is due to alcohol. Pt not displaying any withdrawal symptoms at this time. Will continue to monitor.

## 2016-07-23 NOTE — Care Management Note (Signed)
Case Management Note  Patient Details  Name: [name] MRN: [id number] Date of Birth: [birth year]  Subjective/Objective:    Admitted with CHF                Action/Plan: Patient lives at home, his family lives close by ( daughter/ son in [place] and grandson) that assist him as needed. He is active with [facility] as prior to admission for [facility] / PT; He also has a personal care service that helps him 2 hr 7 days a week. Patient is active with the [facility], PCP is Dr Beacher May and they are working on transferring him to the [place] [facility] in [place] for ongoing medical Care; He receives his medication through the [place] and [place]; no problem getting his medication; He has scales at home and weighs himself daily; DME - rolling walker with seat and cane. CM will continue to follow for DCP.  Expected Discharge Date:  07/24/16               Expected Discharge Plan:  [place]  In-House Referral:  NA  Discharge planning Services  CM Consult  Post Acute Care Choice:    Choice offered to:     DME Arranged:    DME Agency:     HH Arranged:  RN, Disease Management, PT [place] Agency:  [facility]  Status of Service:  In process, will continue to follow  If discussed at Long Length of Stay Meetings, dates discussed:    Additional Comments:  Royston Bake, RN 07/23/2016, 11:34 AM

## 2016-07-23 NOTE — Care Management Important Message (Signed)
Important Message  Patient Details  Name: [name] MRN: [id number] Date of Birth: Mar 18, 1936   Medicare Important Message Given:  Yes    [name] 07/23/2016, 11:45 AM

## 2016-07-23 NOTE — Progress Notes (Addendum)
Progress Note    [name]  RXV:[id number] DOB: [birth year]  DOA: 07/22/2016 PCP: Beacher May, MD    Brief Narrative:   [name] is an 80 y.o. male  with medical history significant CAD, Chronic systolic CHF (EF 50-93 percent by echo 05/12/16), hypertension, diabetes, A. fib on anticoagulation with a history GI bleed, prostate cancer, history of stroke who was admitted 07/22/16 with chief complaint of shortness of breath. Notably, the patient was recently hospitalized from 07/13/16-07/17/16 with a heart failure exacerbation and possible HCAP.  He was discharged on a course of Levaquin. He diuresed over 5 L during that admission and was sent home on 60 mg of Lasix daily (prior to his admission, he was on 80 mg of Lasix daily). He did well up until 24 hours ago, when he awoke short of breath with orthopnea.  Assessment/Plan:   Principal Problem:   Acute on chronic combined systolic (congestive) and diastolic (congestive) heart failure (HCC)/Edema Recently hospitalized with same. Diuresed well during the hospitalization. Went home on 60 mg of Lasix. BNP elevated, chest x-ray with low volume film with cardiomegaly and features suggesting interstitial and basilar alveolar edema EKG without acute changes. Echo done in May of this year with an EF 30-35% Chart indicates weight on discharge 5 days ago 188 today's weight 187 pounds. Continue IV Lasix, 60 mg twice a day. Suspect weight done on admission was incorrect as he has gained 10 pounds. Monitor renal function closely with diuresis. Have asked the heart failure team to follow.  Active Problems:   H/O Alcohol abuse Monitor for signs of withdrawal.  Start thiamine/folic acid/MVI.    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 CBGs suboptimally controlled with numbers in the mid 200s. Will change SSI coverage  to the moderate scale with 4 units of meal coverage, and increase Lantus to 30 units. Continue Neurontin for neuropathy. Hemoglobin A1c 8.9% on 07/09/16.  Needs better glycemic control. DM coordinator consulting.    Hyperlipidemia associated with type 2 diabetes mellitus (HCC) Continue Lipitor.    Essential hypertension Continue metoprolol. Blood pressure controlled.    CAD- last PCI 2011/elevated troponin/demand ischemia Continue statin, beta blocker. Troponin 0.062 with flat trend. 12-lead EKG showed atrial fibrillation with a right bundle branch block at 84 bpm. No significant change from prior.    GERD Continue Protonix.    Chronic atrial fibrillation (HCC)/ Chronic anticoagulation-Eliquis On chronic anticoagulation with Eliquis    Tobacco abuse Tobacco cessation counseling will be provided by nursing staff.    CKD (chronic kidney disease), stage III/Acute on chronic kidney failure (HCC) Baseline creatinine appears to be around 2.1. Current creatinine elevated over usual baseline values. Improving cardiac output will hopefully improve GFR.    Bipolar disorder ([place]) Not currently being treated with psychotropic medications. He had previously been treated with Lithium, which he stopped.  Requesting psychiatric consultation and treatment with Latuda.  Psych evaluation pending.    Family Communication/Anticipated D/C date and plan/Code Status   DVT prophylaxis: Eliquis ordered. Code Status: Full Code.  Family Communication: No family currently at the bedside. Disposition Plan: Likely home with [facility] in 48 hours.   Medical Consultants:    Cardiology.  Psychiatry.   Procedures:    Anti-Infectives:   Anti-infectives    None      Subjective:    [name] is  less short of breath than he was when he came in.  He reports that he awoke short of breath in the night.  Endorsed that he used to weigh himself daily, but then stopped because his weight  was stable and it wasn't explained to him why he needed to continue to do this. No chest pain.    Objective:    Vitals:   07/22/16 1715 07/22/16 1745 07/22/16 1855 07/23/16 0618  BP: 147/78 126/57 135/81 134/84  Pulse: 80 85 93 71  Resp:  19  20  Temp:   97.8 F (36.6 C) 97.3 F (36.3 C)  TempSrc:   Oral Oral  SpO2: 98% 100% 93% 94%  Weight:   89.4 kg (197 lb) 89 kg (196 lb 1.6 oz)  Height:   6' (1.829 m)     Intake/Output Summary (Last 24 hours) at 07/23/16 0957 Last data filed at 07/23/16 0625  Gross per 24 hour  Intake              600 ml  Output              825 ml  Net             -225 ml   Filed Weights   07/22/16 1318 07/22/16 1855 07/23/16 0618  Weight: 84.8 kg (187 lb) 89.4 kg (197 lb) 89 kg (196 lb 1.6 oz)    Exam: General exam: Appears calm and comfortable.  Respiratory system: Scattered bibasilar crackles. Respiratory effort mildly increased. Cardiovascular system: HSIR. No JVD,  rubs, gallops or clicks. No murmurs. Gastrointestinal system: Abdomen is nondistended, soft and nontender. No organomegaly or masses felt. Normal bowel sounds heard. Central nervous system: Alert and oriented. No focal neurological deficits. Extremities: + clubbing.  No cyanosis. 1+ edema. Skin: No rashes, lesions or ulcers. Psychiatry: Judgement and insight appear diminihsed. Mood & affect appropriate.   Data Reviewed:   I have personally reviewed following labs and imaging studies:  Labs: Basic Metabolic Panel:  Recent Labs Lab 07/17/16 1038 07/17/16 1240 07/17/16 1322 07/17/16 1648 07/22/16 1340 07/22/16 1642 07/23/16 0352  NA 136  --   --   --  136  --  133*  K 3.9  --   --   --  4.7  --  4.5  CL 96*  --   --   --  101  --  99*  CO2 33*  --   --   --  26  --  28  GLUCOSE 138*  --   --   --  214*  --  293*  BUN 33*  --   --   --  63*  --  66*  CREATININE 2.09*  --   --   --  3.29* 3.27* 3.09*  CALCIUM 9.2  --   --   --  9.2  --  8.8*  MG  --  1.1* 1.0* 2.6*  --    --   --    GFR Estimated Creatinine Clearance: 22 mL/min (by C-G formula based on SCr of 3.09 mg/dL). Liver Function Tests: No results for input(s): AST, ALT, ALKPHOS, BILITOT, PROT, ALBUMIN in the last 168 hours. No results for input(s): LIPASE, AMYLASE in the last 168 hours. No results for input(s): AMMONIA in the last 168 hours. Coagulation profile No results for input(s): INR, PROTIME in the last 168 hours.  CBC:  Recent Labs Lab 07/22/16 1340 07/22/16 1642 07/23/16 0352  WBC 6.1 5.4 5.7  NEUTROABS 4.0  --   --   HGB 11.6* 11.1* 10.0*  HCT 37.2* 35.3* 32.3*  MCV 84.5 84.7 83.5  PLT 244 228 191   Cardiac Enzymes:  Recent Labs Lab 07/22/16 1902 07/23/16 0352  TROPONINI 0.06* 0.06*   BNP (last 3 results) No results for input(s): PROBNP in the last 8760 hours. CBG:  Recent Labs Lab 07/17/16 1209 07/17/16 1705 07/22/16 1817 07/22/16 2118 07/23/16 0634  GLUCAP 157* 133* 272* 351* 272*   Microbiology No results found for this or any previous visit (from the past 240 hour(s)).  Radiology: Dg Chest 2 View  Result Date: 07/22/2016 CLINICAL DATA:  Shortness of breath. EXAM: CHEST  2 VIEW COMPARISON:  07/22/2016. FINDINGS: The cardio pericardial silhouette is enlarged. Interstitial markings are diffusely coarsened with chronic features. Patchy airspace opacities seen at the lung bases, right greater than left. High right paratracheal opacity may reflect ectasia of the arch vessel anatomy. IMPRESSION: Low volume film with cardiomegaly and features suggesting interstitial and basilar alveolar edema. Electronically Signed   By: Misty Stanley M.D.   On: 07/22/2016 15:07   Medications:   . apixaban  2.5 mg Oral Q12H  . atorvastatin  20 mg Oral QHS  . cilostazol  50 mg Oral Q12H  . feeding supplement (GLUCERNA SHAKE)  237 mL Oral TID BM  . finasteride  5 mg Oral Daily  . furosemide  60 mg Intravenous BID  . gabapentin  300 mg Oral BID  . insulin aspart  0-5 Units  Subcutaneous QHS  . insulin aspart  0-9 Units Subcutaneous TID WC  . insulin glargine  20 Units Subcutaneous QHS  . metoprolol  50 mg Oral BID  . pantoprazole  40 mg Oral Daily  . sodium chloride flush  3 mL Intravenous Q12H  . tamsulosin  0.4 mg Oral Daily  . vitamin B-12  1,000 mcg Oral Daily   Continuous Infusions:   Time spent: 35 minutes with > 50% of time discussing current diagnostic test results, clinical impression and plan of care.   LOS: 1 day   [place]  [facility] Pager [phone]. If unable to reach me by pager, please call my cell phone at [phone].  *Please refer to [email or web address], password [email or web address] to get updated schedule on who will round on this patient, as hospitalists switch teams weekly. If 7PM-7AM, please contact night-coverage at [email or web address], password [email or web address] for any overnight needs.  07/23/2016, 9:57 AM

## 2016-07-23 NOTE — Consult Note (Signed)
[facility] Face-to-Face Psychiatry Consult   Reason for Consult:  Bipolar disorder  Referring Physician:  Dr. Rueben Bash, NP Patient Identification: ANTOINE [name] MRN:  [id number] Principal Diagnosis: Acute on chronic diastolic congestive heart failure [facility]) Diagnosis:   Patient Active Problem List   Diagnosis Date Noted  . CHF exacerbation ([place]) [I50.9] 07/22/2016  . Acute on chronic combined systolic (congestive) and diastolic (congestive) heart failure ([place]) [I50.43] 07/22/2016  . Bipolar disorder ([place]) [F31.9]   . Acute on chronic heart failure ([place]) [I50.9] 07/13/2016  . Acute on chronic systolic congestive heart failure ([place]) [I50.23]   . Alcohol abuse [F10.10] 07/09/2016  . Hypoglycemia [E16.2] 07/08/2016  . Demand ischemia ([place]) [I24.8] 05/11/2016  . Chest pain with high risk of acute coronary syndrome [R07.9] 05/11/2016  . Chronic anticoagulation-Eliquis [Z79.01] 05/11/2016  . Elevated troponin [R79.89] 05/11/2016  . Acute blood loss anemia [D62] 05/11/2016  . Acute on chronic kidney failure ([place]) [N17.9, N18.9] 05/11/2016  . Symptomatic anemia [D64.9] 05/11/2016  . Hypoglycemia due to insulin [E16.0, T38.3X5A]   . Acute respiratory distress (HCC) [R06.00]   . Perineal abscess, superficial [L02.215]   . Melena [K92.1]   . GI bleed-recent SB study-AVMs [K92.2]   . GI bleed [K92.2] 12/23/2015  . Anemia [D64.9] 11/28/2015  . GIB (gastrointestinal bleeding) [K92.2] 11/28/2015  . CVA (cerebral infarction)-Augb 2015 [I63.9] 08/25/2014  . Stroke ([place]) [I63.9] 08/25/2014  . Acute on chronic diastolic congestive heart failure ([place]) [I50.33] 07/17/2014  . CKD (chronic kidney disease), stage III [N18.3] 07/17/2014  . Acute respiratory failure with hypoxemia ([place]) [J96.01] 07/16/2014  . Tobacco abuse [Z72.0] 06/27/2014  . Diastolic CHF, chronic ([place]) [I50.32] 06/27/2014  . Pulmonary edema [J81.1]   . Chronic atrial fibrillation ([place]) [I48.2]   . Acute respiratory failure ([place])  [J96.00] 06/24/2014  . Edema [R60.9] 08/20/2011  . Chest pain, unspecified [R07.9] 04/20/2011  . DM (diabetes mellitus), type 2 with renal complications ([place]) [B01.75] 03/10/2010  . Hyperlipidemia associated with type 2 diabetes mellitus ([place]) [E11.69, E78.5] 03/10/2010  . Essential hypertension [I10] 03/10/2010  . MYOCARDIAL INFARCTION [I21.3] 03/10/2010  . CAD- last PCI 2011 [I25.10, Z98.61] 03/10/2010  . PVD [I73.9] 03/10/2010  . GERD [K21.9] 03/10/2010    Total Time spent with patient: 45 minutes  Subjective:   [name] is a 80 y.o. male patient admitted with Shortness of breath.  HPI:  [name] is a  80 y.o. male with medical history significant CAD, heart failure, hypertension, diabetes, A. fib on anticoagulation with a history GI bleed, prostate cancer, history of stroke presents to the emergency Department chief complaint sudden onset shortness of breath. Initial evaluation reveals acute on chronic heart failure as well as acute on chronic kidney disease.  Patient seen, chart reviewed and case discussed with the staff RN for the face-to-face psychiatric consultation and evaluation of increased symptoms of bipolar disorder. Patient reported he has been suffering with the bipolar depression and mania over several years and has been deceased medications like lithium, Depakote and Tegretol. Patient reported his medication were somewhat helpful but over does not see much benefit. Patient also endorses drinking a lot of fluids and juice which is causing more difficulty for him with shortness of breath. Patient is known for multiple acute medical hospitalizations over the years. Patient endorses manic symptoms like racing thoughts, anger outbursts, nervousness, disturbed sleep but has fair appetite. Patient reportedly veteran and receiving psychiatric medication management from [facility] in [place]. Patient stated his daughter visited him in the  hospital and reportedly  supportive to him. Patient reportedly stopped taking his Depakote about 2 years ago when he had a stroke and could not swallow the pills. Patient is somewhat distracted and does not like to be interrupted and continued to be tangential and somewhat circumstantial thought process during this evaluation.  Medical history: Information is obtained from the patient. He was discharged from the hospital 5 days ago after a 4 day stay for acute respiratory failure related to acute on chronic heart failure in the setting of acute on chronic renal failure. Chart review indicates he diuresed over 5 L and was sent home on 60 mg of Lasix. Patient reports he was on 80 mg prior to that hospitalization. He states he was doing "quite well" until last night when he was awakened suddenly with sudden shortness of breath. He denies chest pain palpitations lower extremity edema. He does endorse worsening orthopnea. He states his morning wore on he felt like he was improving around 11 AM was encouraged to come to the emergency department. He has not home oxygen but states "I was offered it at discharge last time and turned it down as I felt better". He denies headache visual disturbances syncope or near-syncope. He denies abdominal pain nausea vomiting diarrhea constipation melena bright red blood per rectum. He denies dysuria hematuria frequency or urgency. Does endorse a chronic productive cough that he's had "for 4 months since I got a bad cold and the cough never went away".   Past Psychiatric History: Patient has been suffering with a bipolar disorder and has received multiple medication management from [place] psychiatrist at [facility].  Risk to Self: Is patient at risk for suicide?: No Risk to Others:   Prior Inpatient Therapy:   Prior Outpatient Therapy:    Past Medical History:  Past Medical History:  Diagnosis Date  . Anemia 11/28/2015  . Atrial fibrillation ([place])   . Bipolar disorder ([place])   . CAD (coronary  artery disease)    a.  NSTEMI treated with PCI Feb 2011 with a DES.([facility]);   b. cath 2/11: D1 stents x 2 ok, AV groove CFX occluded with dist AV CFX filled by L-L collats, RCA occluded, OM2 90-95% (treated with PCI)  . CKD (chronic kidney disease), stage III   . DM (diabetes mellitus) ([place])   . GERD (gastroesophageal reflux disease)   . Heart failure ([place])   . HTN (hypertension)    echo 2/11: EF 50-55%, diast dyfxn, severe LVH, inf HK, LAE  . Hyperlipidemia   . Hypertensive emergency 06/24/2014  . MI (myocardial infarction) ([place])   . Prostate cancer [facility])    status post radiation therapy in 2003  . Pulmonary edema   . PVD (peripheral vascular disease) ([place])   . Respiratory difficulty 06/24/2014   intubated   . Shortness of breath dyspnea   . Stroke [facility]) 07/2014    Past Surgical History:  Procedure Laterality Date  . COLONOSCOPY N/A 01/25/2016   Procedure: COLONOSCOPY;  Surgeon: Ronald Lobo, MD;  Location: [facility] ENDOSCOPY;  Service: Endoscopy;  Laterality: N/A;  . ESOPHAGOGASTRODUODENOSCOPY N/A 11/29/2015   Procedure: ESOPHAGOGASTRODUODENOSCOPY (EGD);  Surgeon: Wonda Horner, MD;  Location: [facility] ENDOSCOPY;  Service: Endoscopy;  Laterality: N/A;  . ESOPHAGOGASTRODUODENOSCOPY N/A 05/13/2016   Procedure: ESOPHAGOGASTRODUODENOSCOPY (EGD);  Surgeon: Clarene Essex, MD;  Location[facility] ENDOSCOPY;  Service: Endoscopy;  Laterality: N/A;  . ESOPHAGOGASTRODUODENOSCOPY (EGD) WITH PROPOFOL Left 12/25/2015   Procedure: ESOPHAGOGASTRODUODENOSCOPY (EGD) WITH PROPOFOL;  Surgeon: Arta Silence, MD;  Location: [facility] ENDOSCOPY;  Service: Endoscopy;  Laterality: Left;  . FEMORAL BYPASS  2003  . GIVENS CAPSULE STUDY N/A 01/26/2016   Procedure: GIVENS CAPSULE STUDY;  Surgeon: Ronald Lobo, MD;  Location: [facility] ENDOSCOPY;  Service: Endoscopy;  Laterality: N/A;  . HOT HEMOSTASIS N/A 05/13/2016   Procedure: HOT HEMOSTASIS (ARGON PLASMA COAGULATION/BICAP);  Surgeon: Clarene Essex, MD;  Location: [facility] ENDOSCOPY;  Service:  Endoscopy;  Laterality: N/A;   Family History:  Family History  Problem Relation Age of Onset  . Cancer Father   . Cancer Mother   . Cancer Sister    Family Psychiatric  History: Patient was unable to provide family history of psychiatric illness during this visit.  Social History:  History  Alcohol Use  . Yes    Comment: Previously drank heavily, and now has a drink every other day or so.     History  Drug Use  . Types: Marijuana, Cocaine    Social History   Social History  . Marital status: Divorced    Spouse name: N/A  . Number of children: N/A  . Years of education: N/A   Occupational History  . retired Architect    Social History Main Topics  . Smoking status: Current Every Day Smoker    Packs/day: 0.50    Years: 50.00    Types: Cigarettes  . Smokeless tobacco: Never Used     Comment: He has a 50-pack-year hx of tobacco abuse. Currently, smoking about half a pack a day.  . Alcohol use Yes     Comment: Previously drank heavily, and now has a drink every other day or so.  . Drug use:     Types: Marijuana, Cocaine  . Sexual activity: No   Other Topics Concern  . None   Social History Narrative  . None   Additional Social History:    Allergies:   Allergies  Allergen Reactions  . Penicillins Swelling    Has patient had a PCN reaction causing immediate rash, facial/tongue/throat swelling, SOB or lightheadedness with hypotension: Yes Has patient had a PCN reaction causing severe rash involving mucus membranes or skin necrosis: No Has patient had a PCN reaction that required hospitalization: No Has patient had a PCN reaction occurring within the last 10 years: No If all of the above answers are "NO", then may proceed with Cephalosporin use.    Labs:  Results for orders placed or performed during the hospital encounter of 07/22/16 (from the past 48 hour(s))  Basic metabolic panel     Status: Abnormal   Collection Time: 07/22/16  1:40 PM  Result Value  Ref Range   Sodium 136 135 - 145 mmol/L   Potassium 4.7 3.5 - 5.1 mmol/L   Chloride 101 101 - 111 mmol/L   CO2 26 22 - 32 mmol/L   Glucose, Bld 214 (H) 65 - 99 mg/dL   BUN 63 (H) 6 - 20 mg/dL   Creatinine, Ser 3.29 (H) 0.61 - 1.24 mg/dL   Calcium 9.2 8.9 - 10.3 mg/dL   GFR calc non Af Amer 17 (L) >60 mL/min   GFR calc Af Amer 19 (L) >60 mL/min    Comment: (NOTE) The eGFR has been calculated using the CKD EPI equation. This calculation has not been validated in all clinical situations. eGFR's persistently <60 mL/min signify possible Chronic Kidney Disease.    Anion gap 9 5 - 15  CBC with Differential     Status: Abnormal   Collection Time: 07/22/16  1:40 PM  Result Value Ref Range   WBC 6.1 4.0 - 10.5 K/uL   RBC 4.40 4.22 - 5.81 MIL/uL   Hemoglobin 11.6 (L) 13.0 - 17.0 g/dL   HCT 37.2 (L) 39.0 - 52.0 %   MCV 84.5 78.0 - 100.0 fL   MCH 26.4 26.0 - 34.0 pg   MCHC 31.2 30.0 - 36.0 g/dL   RDW 16.9 (H) 11.5 - 15.5 %   Platelets 244 150 - 400 K/uL   Neutrophils Relative % 66 %   Neutro Abs 4.0 1.7 - 7.7 K/uL   Lymphocytes Relative 18 %   Lymphs Abs 1.1 0.7 - 4.0 K/uL   Monocytes Relative 13 %   Monocytes Absolute 0.8 0.1 - 1.0 K/uL   Eosinophils Relative 2 %   Eosinophils Absolute 0.1 0.0 - 0.7 K/uL   Basophils Relative 1 %   Basophils Absolute 0.0 0.0 - 0.1 K/uL  Brain natriuretic peptide     Status: Abnormal   Collection Time: 07/22/16  1:40 PM  Result Value Ref Range   B Natriuretic Peptide 1,454.7 (H) 0.0 - 100.0 pg/mL  I-stat troponin, ED     Status: Abnormal   Collection Time: 07/22/16  1:51 PM  Result Value Ref Range   Troponin i, poc 0.09 (HH) 0.00 - 0.08 ng/mL   Comment NOTIFIED PHYSICIAN    Comment 3            Comment: Due to the release kinetics of cTnI, a negative result within the first hours of the onset of symptoms does not rule out myocardial infarction with certainty. If myocardial infarction is still suspected, repeat the test at appropriate  intervals.   CBC     Status: Abnormal   Collection Time: 07/22/16  4:42 PM  Result Value Ref Range   WBC 5.4 4.0 - 10.5 K/uL   RBC 4.17 (L) 4.22 - 5.81 MIL/uL   Hemoglobin 11.1 (L) 13.0 - 17.0 g/dL   HCT 35.3 (L) 39.0 - 52.0 %   MCV 84.7 78.0 - 100.0 fL   MCH 26.6 26.0 - 34.0 pg   MCHC 31.4 30.0 - 36.0 g/dL   RDW 16.9 (H) 11.5 - 15.5 %   Platelets 228 150 - 400 K/uL  Creatinine, serum     Status: Abnormal   Collection Time: 07/22/16  4:42 PM  Result Value Ref Range   Creatinine, Ser 3.27 (H) 0.61 - 1.24 mg/dL   GFR calc non Af Amer 17 (L) >60 mL/min   GFR calc Af Amer 19 (L) >60 mL/min    Comment: (NOTE) The eGFR has been calculated using the CKD EPI equation. This calculation has not been validated in all clinical situations. eGFR's persistently <60 mL/min signify possible Chronic Kidney Disease.   Glucose, capillary     Status: Abnormal   Collection Time: 07/22/16  6:17 PM  Result Value Ref Range   Glucose-Capillary 272 (H) 65 - 99 mg/dL  Troponin I     Status: Abnormal   Collection Time: 07/22/16  7:02 PM  Result Value Ref Range   Troponin I 0.06 (HH) <0.03 ng/mL    Comment: CRITICAL RESULT CALLED TO, READ BACK BY AND VERIFIED WITH: D HART,RN 2017 07/22/16 D BRADLEY   Glucose, capillary     Status: Abnormal   Collection Time: 07/22/16  9:18 PM  Result Value Ref Range   Glucose-Capillary 351 (H) 65 - 99 mg/dL   Comment 1 Notify RN    Comment 2 Document in  Chart   Basic metabolic panel     Status: Abnormal   Collection Time: 07/23/16  3:52 AM  Result Value Ref Range   Sodium 133 (L) 135 - 145 mmol/L   Potassium 4.5 3.5 - 5.1 mmol/L   Chloride 99 (L) 101 - 111 mmol/L   CO2 28 22 - 32 mmol/L   Glucose, Bld 293 (H) 65 - 99 mg/dL   BUN 66 (H) 6 - 20 mg/dL   Creatinine, Ser 3.09 (H) 0.61 - 1.24 mg/dL   Calcium 8.8 (L) 8.9 - 10.3 mg/dL   GFR calc non Af Amer 18 (L) >60 mL/min   GFR calc Af Amer 21 (L) >60 mL/min    Comment: (NOTE) The eGFR has been calculated using  the CKD EPI equation. This calculation has not been validated in all clinical situations. eGFR's persistently <60 mL/min signify possible Chronic Kidney Disease.    Anion gap 6 5 - 15  CBC     Status: Abnormal   Collection Time: 07/23/16  3:52 AM  Result Value Ref Range   WBC 5.7 4.0 - 10.5 K/uL   RBC 3.87 (L) 4.22 - 5.81 MIL/uL   Hemoglobin 10.0 (L) 13.0 - 17.0 g/dL   HCT 32.3 (L) 39.0 - 52.0 %   MCV 83.5 78.0 - 100.0 fL   MCH 25.8 (L) 26.0 - 34.0 pg   MCHC 31.0 30.0 - 36.0 g/dL   RDW 16.8 (H) 11.5 - 15.5 %   Platelets 191 150 - 400 K/uL  Troponin I     Status: Abnormal   Collection Time: 07/23/16  3:52 AM  Result Value Ref Range   Troponin I 0.06 (HH) <0.03 ng/mL    Comment: CRITICAL VALUE NOTED.  VALUE IS CONSISTENT WITH PREVIOUSLY REPORTED AND CALLED VALUE.  Glucose, capillary     Status: Abnormal   Collection Time: 07/23/16  6:34 AM  Result Value Ref Range   Glucose-Capillary 272 (H) 65 - 99 mg/dL   Comment 1 Notify RN     Current Facility-Administered Medications  Medication Dose Route Frequency Provider Last Rate Last Dose  . acetaminophen (TYLENOL) suppository 650 mg  650 mg Rectal Q6H PRN Radene Gunning, NP      . albuterol (PROVENTIL) (2.5 MG/3ML) 0.083% nebulizer solution 2.5 mg  2.5 mg Nebulization Q6H PRN Radene Gunning, NP   2.5 mg at 07/23/16 0229  . apixaban (ELIQUIS) tablet 2.5 mg  2.5 mg Oral Q12H Radene Gunning, NP   2.5 mg at 07/22/16 2155  . atorvastatin (LIPITOR) tablet 20 mg  20 mg Oral QHS Radene Gunning, NP   20 mg at 07/22/16 2156  . cilostazol (PLETAL) tablet 50 mg  50 mg Oral Q12H Radene Gunning, NP   50 mg at 07/22/16 2155  . feeding supplement (GLUCERNA SHAKE) (GLUCERNA SHAKE) liquid 237 mL  237 mL Oral TID BM Radene Gunning, NP      . finasteride (PROSCAR) tablet 5 mg  5 mg Oral Daily Lezlie Octave Black, NP      . furosemide (LASIX) injection 60 mg  60 mg Intravenous BID Radene Gunning, NP   60 mg at 07/22/16 1734  . gabapentin (NEURONTIN) capsule 300 mg   300 mg Oral BID Radene Gunning, NP   300 mg at 07/22/16 2156  . guaiFENesin-dextromethorphan (ROBITUSSIN DM) 100-10 MG/5ML syrup 5 mL  5 mL Oral Q4H PRN Gardiner Barefoot, NP   5 mL at 07/23/16 0131  .  insulin aspart (novoLOG) injection 0-5 Units  0-5 Units Subcutaneous QHS Radene Gunning, NP   5 Units at 07/22/16 2207  . insulin aspart (novoLOG) injection 0-9 Units  0-9 Units Subcutaneous TID WC Waldemar Dickens, MD   5 Units at 07/23/16 [phone]  . insulin glargine (LANTUS) injection 20 Units  20 Units Subcutaneous QHS Radene Gunning, NP   20 Units at 07/22/16 2156  . metoprolol tartrate (LOPRESSOR) tablet 50 mg  50 mg Oral BID Radene Gunning, NP   50 mg at 07/22/16 2155  . ondansetron (ZOFRAN) tablet 4 mg  4 mg Oral Q6H PRN Radene Gunning, NP       Or  . ondansetron [facility]) injection 4 mg  4 mg Intravenous Q6H PRN Radene Gunning, NP      . pantoprazole (PROTONIX) EC tablet 40 mg  40 mg Oral Daily Lezlie Octave Black, NP      . simethicone [facility]) chewable tablet 80 mg  80 mg Oral TID PRN Radene Gunning, NP      . sodium chloride flush (NS) 0.9 % injection 3 mL  3 mL Intravenous Q12H Lezlie Octave Black, NP      . tamsulosin (FLOMAX) capsule 0.4 mg  0.4 mg Oral Daily Lezlie Octave Black, NP      . vitamin B-12 (CYANOCOBALAMIN) tablet 1,000 mcg  1,000 mcg Oral Daily Radene Gunning, NP        Musculoskeletal: Strength & Muscle Tone: decreased Gait & Station: unsteady, walkng with the help of walker. Patient leans: Front  Psychiatric Specialty Exam: Physical Exam as per history and physical   ROS patient complaining about depression, irritability, mood swings, racing thoughts and sleep disturbance. Patient has a tangential and circumstantial thought process and pressured speech.  No Fever-chills, No Headache, No changes with Vision or hearing, reports vertigo No problems swallowing food or Liquids, No Chest pain, Cough or Shortness of Breath, No Abdominal pain, No Nausea or Vommitting, Bowel movements are  regular, No Blood in stool or Urine, No dysuria, No new skin rashes or bruises, No new joints pains-aches,  No new weakness, tingling, numbness in any extremity, No recent weight gain or loss, No polyuria, polydypsia or polyphagia,  A full 10 point Review of Systems was done, except as stated above, all other Review of Systems were negative.  Blood pressure 134/84, pulse 71, temperature 97.3 F (36.3 C), temperature source Oral, resp. rate 20, height 6' (1.829 m), weight 89 kg (196 lb 1.6 oz), SpO2 94 %.Body mass index is 26.6 kg/m.  General Appearance: Guarded  Eye Contact:  Good  Speech:  Pressured  Volume:  Increased  Mood:  Euphoric and Irritable  Affect:  Labile  Thought Process:  Irrelevant  Orientation:  Full (Time, Place, and Person)  Thought Content:  Rumination and Tangential  Suicidal Thoughts:  No  Homicidal Thoughts:  No  Memory:  Immediate;   Good Recent;   Fair Remote;   Fair  Judgement:  Fair  Insight:  Fair  Psychomotor Activity:  Decreased  Concentration:  Concentration: Fair and Attention Span: Fair  Recall:  [employer] of Knowledge:  Good  Language:  Good  Akathisia:  Negative  Handed:  Right  AIMS (if indicated):     Assets:  Communication Skills Desire for Improvement Financial Resources/Insurance Housing Leisure Time Resilience Social Support  ADL's:  Impaired  Cognition:  Impaired,  Mild  Sleep:        Treatment  Plan Summary:Patient has been suffering with the bipolar disorder and a history of multiple mood stabilizer treatment but currently has no major mood stabilization but continue taking gabapentin. Patient continued to have manic symptoms like racing thoughts, irritability, anger outbursts, pressured speech and decreased need for sleep and increased goal-directed activity. Patient denied active suicidal/homicidal ideation or psychotic symptoms. Patient has a multiple medical problems including congestive heart failure exacerbation and  elevated QTc levels during recent EKG.   Daily contact with patient to assess and evaluate symptoms and progress in treatment and Medication management   Bipolar disorder: We will initiate Depakote DR 250 mg every 8 hours for mood stabilization. Recommended to check valproic acid level 5 days from now for therapeutic levels. Patient may benefit from anti-psychiatric medication Latuda Heath Depakote does not control his mood swings and also will have a cardiology clearance for antipsychotic medication.  Safety concerns: Patient has no known safety concerns and denies active suicidal/homicidal ideation, intention or plans.  Appreciate psychiatric consultation and we sign off as of today Please contact [phone] or [phone] if needs further assistance   Disposition: Patient does not meet criteria for psychiatric inpatient admission. Supportive therapy provided about ongoing stressors.  Ambrose Finland, MD 07/23/2016 9:37 AM

## 2016-07-23 NOTE — Clinical Social Work Note (Signed)
CSW acknowledges consult: "Needs new pcp for hospital f/u until able to get to [place] in [place]." Will notify RNCM.  CSW signing off. Consult if any other social work needs arise.  Dayton Scrape, [place]

## 2016-07-23 NOTE — Consult Note (Signed)
Advanced Heart Failure Team Consult Note  Referring Physician: Dr Rockne Menghini  Primary Physician: None Primary Cardiologist:  Dr Burt Knack   Reason for Consultation: Acute/Chronic Systolic Heart Failure   HPI:   Mr Luckadoo is a 80 year old with a history of CAD, heart failure, hypertension, diabetes, A. fib on anticoagulation with history of GI bleed, prostate cancer, and history of stroke. Receiving majority of his medical care at the [place] in [place].    In May 2017 he was admitted with chest pain, mildly elevated troponin, and black stools. Had EGD with ablation of AVM. Anticoagulants (eliquis) stopped but later restarted.   Earlier this month he was admitted with increased dyspnea and volume overload. Treated with antibiotic course for possible HCAP. Diuresed with IV lasix then transitioned to lasix 60 mg po daily (prior to this admit he was on lasix 80 mg daily).  Discharge weight was 188 pounds.   Prior to admit he was SOB with exertion. Drinks 1/2 gallon of liquor a week. Lives alone in an [place]. Does weigh daily. He prepares his weekly pill box. Has an aide 2 1/2 hours per day. Requires assistance with transportation to medical appointments. Ambulates with a cane. Frequently frustrated with medical care and has signed AMA.   Yesterday he was admitted with increased dyspnea. A/C systolic heart failure and A/C CKD. CXR with edema noted. Diuresing with IV lasix.  Psychiatry consulted for bipolar disorder. Today he remains SOB with exertion. Denies CP.   Pertinent admission labs include: K 4.7, Creatinine 3.29, BNP 1454, Hgb 11.6, Troponin 0.09>0.06    LHC 2011-DES L circumflex,Total RCA   ECHO 04/2016 EF 25-30% RV normal  ECHO 07/2014 EF 40-45% RV mildly dilated  Review of Systems: [y] = yes, '[ ]'$  = no   General: Weight gain '[ ]'$ ; Weight loss '[ ]'$ ; Anorexia '[ ]'$ ; Fatigue [ Y]; Fever '[ ]'$ ; Chills '[ ]'$ ; Weakness '[ ]'$   Cardiac: Chest pain/pressure '[ ]'$ ; Resting SOB '[ ]'$ ; Exertional SOB [  Y]; Orthopnea '[ ]'$ ; Pedal Edema [ Y]; Palpitations '[ ]'$ ; Syncope '[ ]'$ ; Presyncope '[ ]'$ ; Paroxysmal nocturnal dyspnea'[ ]'$   Pulmonary: Cough '[ ]'$ ; Wheezing'[ ]'$ ; Hemoptysis'[ ]'$ ; Sputum '[ ]'$ ; Snoring '[ ]'$   GI: Vomiting'[ ]'$ ; Dysphagia'[ ]'$ ; Melena'[ ]'$ ; Hematochezia '[ ]'$ ; Heartburn'[ ]'$ ; Abdominal pain '[ ]'$ ; Constipation '[ ]'$ ; Diarrhea '[ ]'$ ; BRBPR '[ ]'$   GU: Hematuria'[ ]'$ ; Dysuria '[ ]'$ ; Nocturia'[ ]'$   Vascular: Pain in legs with walking '[ ]'$ ; Pain in feet with lying flat '[ ]'$ ; Non-healing sores '[ ]'$ ; Stroke '[ ]'$ ; TIA '[ ]'$ ; Slurred speech '[ ]'$ ;  Neuro: Headaches'[ ]'$ ; Vertigo'[ ]'$ ; Seizures'[ ]'$ ; Paresthesias'[ ]'$ ;Blurred vision '[ ]'$ ; Diplopia '[ ]'$ ; Vision changes '[ ]'$   Ortho/Skin: Arthritis '[ ]'$ ; Joint pain [ Y]; Muscle pain '[ ]'$ ; Joint swelling '[ ]'$ ; Back Pain '[ ]'$ ; Rash '[ ]'$   Psych: Depression'[ ]'$ ; Anxiety'[ ]'$   Heme: Bleeding problems '[ ]'$ ; Clotting disorders '[ ]'$ ; Anemia '[ ]'$   Endocrine: Diabetes [Y ]; Thyroid dysfunction'[ ]'$   Home Medications Prior to Admission medications   Medication Sig Start Date End Date Taking? Authorizing Provider  acetaminophen (TYLENOL) 325 MG tablet Take 650 mg by mouth every 8 (eight) hours as needed for mild pain or fever (pain).    Yes Historical Provider, MD  albuterol (PROVENTIL HFA;VENTOLIN HFA) 108 (90 BASE) MCG/ACT inhaler Inhale 2 puffs into the lungs every 6 (six) hours as needed for shortness of breath.   Yes Historical Provider, MD  ammonium lactate (LAC-HYDRIN) 12 % lotion Apply 1 application topically daily.   Yes Historical Provider, MD  apixaban (ELIQUIS) 2.5 MG TABS tablet Take 2.5 mg by mouth every 12 (twelve) hours.   Yes Historical Provider, MD  atorvastatin (LIPITOR) 40 MG tablet Take 1 tablet (40 mg total) by mouth daily. Patient taking differently: Take 20 mg by mouth at bedtime.  08/01/14  Yes Sherren Mocha, MD  cilostazol (PLETAL) 50 MG tablet Take 50 mg by mouth every 12 (twelve) hours.    Yes Historical Provider, MD  dextrose (GLUTOSE) 40 % GEL Take 1 Tube by mouth daily as needed for low blood  sugar.    Yes Historical Provider, MD  feeding supplement, GLUCERNA SHAKE, (GLUCERNA SHAKE) LIQD Take 237 mLs by mouth 3 (three) times daily between meals.   Yes Historical Provider, MD  finasteride (PROSCAR) 5 MG tablet Take 5 mg by mouth daily.     Yes Historical Provider, MD  furosemide (LASIX) 40 MG tablet Take 1.5 tablets (60 mg total) by mouth daily. 07/17/16  Yes Hosie Poisson, MD  gabapentin (NEURONTIN) 300 MG capsule Take 300 mg by mouth 2 (two) times daily.    Yes Historical Provider, MD  guaifenesin (HUMIBID E) 400 MG TABS tablet Take 400 mg by mouth 3 (three) times daily as needed. For congestion   Yes Historical Provider, MD  insulin glargine (LANTUS) 100 UNIT/ML injection Inject 40 Units into the skin daily.   Yes Historical Provider, MD  insulin regular (NOVOLIN R,HUMULIN R) 100 units/mL injection Inject 5 Units into the skin See admin instructions. Inject 5 units under the skin before breakfast and inject 5 units before evening meal   Yes Historical Provider, MD  metoprolol (LOPRESSOR) 50 MG tablet Take 50 mg by mouth 2 (two) times daily.   Yes Historical Provider, MD  Multiple Vitamins-Minerals (MULTIVITAMIN WITH MINERALS) tablet Take 1 tablet by mouth daily.   Yes Historical Provider, MD  pantoprazole (PROTONIX) 40 MG tablet Take 1 tablet (40 mg total) by mouth daily. 11/29/15  Yes Alexa Angela Burke, MD  simethicone (MYLICON) 80 MG chewable tablet Chew 80 mg by mouth 3 (three) times daily as needed for flatulence. Take with meals as needed   Yes Historical Provider, MD  Tamsulosin HCl (FLOMAX) 0.4 MG CAPS Take 0.4 mg by mouth daily.     Yes Historical Provider, MD  vitamin B-12 (CYANOCOBALAMIN) 1000 MCG tablet Take 1,000 mcg by mouth daily.   Yes Historical Provider, MD  levofloxacin (LEVAQUIN) 500 MG tablet Take 1 tablet (500 mg total) by mouth every other day. Patient not taking: Reported on 07/22/2016 07/18/16   Hosie Poisson, MD  nystatin (MYCOSTATIN) 100000 UNIT/ML suspension Take 5 mLs  (500,000 Units total) by mouth 4 (four) times daily. Patient not taking: Reported on 07/22/2016 11/29/15   Florinda Marker, MD    Past Medical History: Past Medical History:  Diagnosis Date  . Anemia 11/28/2015  . Atrial fibrillation ([place])   . Bipolar disorder ([place])   . CAD (coronary artery disease)    a.  NSTEMI treated with PCI Feb 2011 with a DES.([facility]);   b. cath 2/11: D1 stents x 2 ok, AV groove CFX occluded with dist AV CFX filled by L-L collats, RCA occluded, OM2 90-95% (treated with PCI)  . CKD (chronic kidney disease), stage III   . DM (diabetes mellitus) ([place])   . GERD (gastroesophageal reflux disease)   . GI bleed-recent SB study-AVMs   . Heart failure ([place])   .  HTN (hypertension)    echo 2/11: EF 50-55%, diast dyfxn, severe LVH, inf HK, LAE  . Hyperlipidemia   . Hypertensive emergency 06/24/2014  . MI (myocardial infarction) ([place])   . Prostate cancer [facility])    status post radiation therapy in 2003  . Pulmonary edema   . PVD (peripheral vascular disease) ([place])   . Respiratory difficulty 06/24/2014   intubated   . Shortness of breath dyspnea   . Stroke [facility]) 07/2014    Past Surgical History: Past Surgical History:  Procedure Laterality Date  . COLONOSCOPY N/A 01/25/2016   Procedure: COLONOSCOPY;  Surgeon: Ronald Lobo, MD;  Location: [facility] ENDOSCOPY;  Service: Endoscopy;  Laterality: N/A;  . ESOPHAGOGASTRODUODENOSCOPY N/A 11/29/2015   Procedure: ESOPHAGOGASTRODUODENOSCOPY (EGD);  Surgeon: Wonda Horner, MD;  Location: [facility] ENDOSCOPY;  Service: Endoscopy;  Laterality: N/A;  . ESOPHAGOGASTRODUODENOSCOPY N/A 05/13/2016   Procedure: ESOPHAGOGASTRODUODENOSCOPY (EGD);  Surgeon: Clarene Essex, MD;  Location: [facility] ENDOSCOPY;  Service: Endoscopy;  Laterality: N/A;  . ESOPHAGOGASTRODUODENOSCOPY (EGD) WITH PROPOFOL Left 12/25/2015   Procedure: ESOPHAGOGASTRODUODENOSCOPY (EGD) WITH PROPOFOL;  Surgeon: Arta Silence, MD;  Location[facility] ENDOSCOPY;  Service: Endoscopy;  Laterality: Left;  .  FEMORAL BYPASS  2003  . GIVENS CAPSULE STUDY N/A 01/26/2016   Procedure: GIVENS CAPSULE STUDY;  Surgeon: Ronald Lobo, MD;  Location[facility] ENDOSCOPY;  Service: Endoscopy;  Laterality: N/A;  . HOT HEMOSTASIS N/A 05/13/2016   Procedure: HOT HEMOSTASIS (ARGON PLASMA COAGULATION/BICAP);  Surgeon: Clarene Essex, MD;  Location: [facility] ENDOSCOPY;  Service: Endoscopy;  Laterality: N/A;    Family History: Family History  Problem Relation Age of Onset  . Cancer Father   . Cancer Mother   . Cancer Sister     Social History: Social History   Social History  . Marital status: Divorced    Spouse name: N/A  . Number of children: N/A  . Years of education: N/A   Occupational History  . retired Architect    Social History Main Topics  . Smoking status: Current Every Day Smoker    Packs/day: 0.50    Years: 50.00    Types: Cigarettes  . Smokeless tobacco: Never Used     Comment: He has a 50-pack-year hx of tobacco abuse. Currently, smoking about half a pack a day.  . Alcohol use Yes     Comment: Previously drank heavily, and now has a drink every other day or so.  . Drug use:     Types: Marijuana, Cocaine  . Sexual activity: No   Other Topics Concern  . None   Social History Narrative  . None    Allergies:  Allergies  Allergen Reactions  . Penicillins Swelling    Has patient had a PCN reaction causing immediate rash, facial/tongue/throat swelling, SOB or lightheadedness with hypotension: Yes Has patient had a PCN reaction causing severe rash involving mucus membranes or skin necrosis: No Has patient had a PCN reaction that required hospitalization: No Has patient had a PCN reaction occurring within the last 10 years: No If all of the above answers are "NO", then may proceed with Cephalosporin use.    Objective:    Vital Signs:   Temp:  [97.3 F (36.3 C)-97.8 F (36.6 C)] 97.3 F (36.3 C) (07/27 0618) Pulse Rate:  [68-93] 71 (07/27 0618) Resp:  [15-25] 20 (07/27 0618) BP:  (125-151)/(57-95) 134/84 (07/27 0618) SpO2:  [93 %-100 %] 94 % (07/27 0618) Weight:  [84.8 kg (187 lb)-89.4 kg (197 lb)] 89 kg (196 lb 1.6 oz) (  07/27 0618) Last BM Date: 07/22/16  Weight change: Filed Weights   07/22/16 1318 07/22/16 1855 07/23/16 0618  Weight: 84.8 kg (187 lb) 89.4 kg (197 lb) 89 kg (196 lb 1.6 oz)    Intake/Output:   Intake/Output Summary (Last 24 hours) at 07/23/16 1058 Last data filed at 07/23/16 1044  Gross per 24 hour  Intake             1080 ml  Output              825 ml  Net              255 ml     Physical Exam: General:  Well appearing. No resp difficulty. Sitting on the side of the bed.  HEENT: normal Neck: supple. JVP ~10 . Carotids 2+ bilat; no bruits. No lymphadenopathy or thryomegaly appreciated. Cor: PMI nondisplaced. Irregular rate & rhythm. No rubs, gallops or murmurs. Lungs: RLL crackles on Room Air Abdomen: soft, nontender, nondistended. No hepatosplenomegaly. No bruits or masses. Good bowel sounds. Extremities: no cyanosis, clubbing, rash, LLE 1-2+ edema Neuro: alert & orientedx3, cranial nerves grossly intact. moves all 4 extremities w/o difficulty. Affect pleasant  Telemetry: Afib   Labs: Basic Metabolic Panel:  Recent Labs Lab 07/17/16 1038 07/17/16 1240 07/17/16 1322 07/17/16 1648 07/22/16 1340 07/22/16 1642 07/23/16 0352  NA 136  --   --   --  136  --  133*  K 3.9  --   --   --  4.7  --  4.5  CL 96*  --   --   --  101  --  99*  CO2 33*  --   --   --  26  --  28  GLUCOSE 138*  --   --   --  214*  --  293*  BUN 33*  --   --   --  63*  --  66*  CREATININE 2.09*  --   --   --  3.29* 3.27* 3.09*  CALCIUM 9.2  --   --   --  9.2  --  8.8*  MG  --  1.1* 1.0* 2.6*  --   --   --     Liver Function Tests: No results for input(s): AST, ALT, ALKPHOS, BILITOT, PROT, ALBUMIN in the last 168 hours. No results for input(s): LIPASE, AMYLASE in the last 168 hours. No results for input(s): AMMONIA in the last 168  hours.  CBC:  Recent Labs Lab 07/22/16 1340 07/22/16 1642 07/23/16 0352  WBC 6.1 5.4 5.7  NEUTROABS 4.0  --   --   HGB 11.6* 11.1* 10.0*  HCT 37.2* 35.3* 32.3*  MCV 84.5 84.7 83.5  PLT 244 228 191    Cardiac Enzymes:  Recent Labs Lab 07/22/16 1902 07/23/16 0352  TROPONINI 0.06* 0.06*    BNP: BNP (last 3 results)  Recent Labs  07/13/16 0550 07/17/16 1322 07/22/16 1340  BNP 1,283.3* 642.9* 1,454.7*    ProBNP (last 3 results) No results for input(s): PROBNP in the last 8760 hours.   CBG:  Recent Labs Lab 07/17/16 1209 07/17/16 1705 07/22/16 1817 07/22/16 2118 07/23/16 0634  GLUCAP 157* 133* 272* 351* 272*    Coagulation Studies: No results for input(s): LABPROT, INR in the last 72 hours.  Other results: EKG: A fib 84 bpm   Imaging: Dg Chest 2 View  Result Date: 07/22/2016 CLINICAL DATA:  Shortness of breath. EXAM: CHEST  2 VIEW COMPARISON:  07/22/2016.  FINDINGS: The cardio pericardial silhouette is enlarged. Interstitial markings are diffusely coarsened with chronic features. Patchy airspace opacities seen at the lung bases, right greater than left. High right paratracheal opacity may reflect ectasia of the arch vessel anatomy. IMPRESSION: Low volume film with cardiomegaly and features suggesting interstitial and basilar alveolar edema. Electronically Signed   By: Misty Stanley M.D.   On: 07/22/2016 15:07     Medications:     Current Medications: . apixaban  2.5 mg Oral Q12H  . atorvastatin  20 mg Oral QHS  . cilostazol  50 mg Oral Q12H  . feeding supplement (GLUCERNA SHAKE)  237 mL Oral TID BM  . finasteride  5 mg Oral Daily  . furosemide  60 mg Intravenous BID  . gabapentin  300 mg Oral BID  . insulin aspart  0-15 Units Subcutaneous TID WC  . insulin aspart  0-5 Units Subcutaneous QHS  . insulin aspart  4 Units Subcutaneous TID WC  . insulin glargine  20 Units Subcutaneous QHS  . metoprolol  50 mg Oral BID  . pantoprazole  40 mg Oral  Daily  . sodium chloride flush  3 mL Intravenous Q12H  . tamsulosin  0.4 mg Oral Daily  . vitamin B-12  1,000 mcg Oral Daily     Infusions:      Assessment/Plan   1. A/C Systolic Heart Failure: Ischemic cardiomyopathy.  NYHA IIIb symptoms.  ECHO May 2017 EF 25-30% which is down from previous 40-45% in May 2015. Last LHC 2011 DES to circ and totally occluded RCA. Would not cath with elevated serum creatinine. Could get Myoview to rule out large area of ischemia. On admit at had CXR with edema. Diuresing with 60 mg IV lasix.  Remains volume overloaded today.   - Increase lasix to 80 mg twice a day.  - Stop lopressor and switch to Toprol XL 50 mg twice a day.  - Add 12.5 mg hydralazine tid and 15 mg imdur daily.  - No Dig, spiro, ace with CKD 2. A/C CKD: Creatinine baseline  ~2. On admit creatinine 3.27. Has been taking NSAIDs on occasion. Instructed to avoid NSAIDs. Watch closely with diuresis.  3. CAD: [place] 2011 with DES to circ and totally occluded RCA. Troponin  0.06>0.06. On eliquis, bb, and statin.  4. Chronic A fib: Rate controlled. On bb. On eliquis 2.5 mg twice a day. No evidence of bleeding. Hgb stable.  5. H/O GI bleed: Recent, remains on anticoagulation and hemoglobin stable.    6. Bipolar Disorder: Psychiatry consulted.   He will need closed follow up. Once discharged will need Paramedicine. I have referred. Also outpatient follow up in the HF clinic.   Length of Stay: 1  Amy Clegg Np-C  07/23/2016, 10:58 AM  Advanced Heart Failure Team Pager [phone] (M-F; 7a - 4p)  Please contact [facility] for night-coverage after hours (4p -7a ) and weekends on [email or web address]  Patient seen with NP, agree with the above note.  Patient was admitted with CHF symptoms, no chest pain.  EF noted to be down to 25-30%, from 40-45% in 5/15.  He is volume overloaded on exam.  Chronic afib.  - Will diurese as above and add hydralazine/nitrates, change beta blocker to Toprol XL.  - Would not  move right to cath for fall in EF given elevated creatinine to 3, Would consider Cardiolite either this admission or as outpatient to assess for large area of ischemia.  - Follow creatinine closely with diuresis, lower  today.   Loralie Champagne 07/23/2016 1:29 PM

## 2016-07-24 DIAGNOSIS — K219 Gastro-esophageal reflux disease without esophagitis: Secondary | ICD-10-CM

## 2016-07-24 DIAGNOSIS — Z9861 Coronary angioplasty status: Secondary | ICD-10-CM

## 2016-07-24 DIAGNOSIS — I482 Chronic atrial fibrillation: Secondary | ICD-10-CM

## 2016-07-24 DIAGNOSIS — N183 Chronic kidney disease, stage 3 (moderate): Secondary | ICD-10-CM

## 2016-07-24 DIAGNOSIS — F310 Bipolar disorder, current episode hypomanic: Secondary | ICD-10-CM

## 2016-07-24 DIAGNOSIS — E1169 Type 2 diabetes mellitus with other specified complication: Secondary | ICD-10-CM

## 2016-07-24 DIAGNOSIS — Z7901 Long term (current) use of anticoagulants: Secondary | ICD-10-CM

## 2016-07-24 DIAGNOSIS — I2510 Atherosclerotic heart disease of native coronary artery without angina pectoris: Secondary | ICD-10-CM

## 2016-07-24 DIAGNOSIS — E785 Hyperlipidemia, unspecified: Secondary | ICD-10-CM

## 2016-07-24 DIAGNOSIS — I509 Heart failure, unspecified: Secondary | ICD-10-CM

## 2016-07-24 DIAGNOSIS — I5023 Acute on chronic systolic (congestive) heart failure: Secondary | ICD-10-CM

## 2016-07-24 DIAGNOSIS — Z794 Long term (current) use of insulin: Secondary | ICD-10-CM

## 2016-07-24 DIAGNOSIS — Z720 Tobacco use: Secondary | ICD-10-CM

## 2016-07-24 DIAGNOSIS — I248 Other forms of acute ischemic heart disease: Secondary | ICD-10-CM

## 2016-07-24 DIAGNOSIS — E1122 Type 2 diabetes mellitus with diabetic chronic kidney disease: Secondary | ICD-10-CM

## 2016-07-24 DIAGNOSIS — I10 Essential (primary) hypertension: Secondary | ICD-10-CM

## 2016-07-24 LAB — BASIC METABOLIC PANEL
ANION GAP: 7 (ref 5–15)
BUN: 68 mg/dL — AB (ref 6–20)
CHLORIDE: 99 mmol/L — AB (ref 101–111)
CO2: 31 mmol/L (ref 22–32)
Calcium: 9.3 mg/dL (ref 8.9–10.3)
Creatinine, Ser: 2.69 mg/dL — ABNORMAL HIGH (ref 0.61–1.24)
GFR, EST AFRICAN AMERICAN: 25 mL/min — AB (ref >60)
GFR, EST NON AFRICAN AMERICAN: 21 mL/min — AB (ref >60)
Glucose, Bld: 202 mg/dL — ABNORMAL HIGH (ref 65–99)
POTASSIUM: 3.9 mmol/L (ref 3.5–5.1)
SODIUM: 137 mmol/L (ref 135–145)

## 2016-07-24 LAB — GLUCOSE, CAPILLARY
GLUCOSE-CAPILLARY: 184 mg/dL — AB (ref 65–99)
GLUCOSE-CAPILLARY: 148 mg/dL — AB (ref 65–99)
GLUCOSE-CAPILLARY: 79 mg/dL (ref 65–99)
Glucose-Capillary: 173 mg/dL — ABNORMAL HIGH (ref 65–99)

## 2016-07-24 LAB — MAGNESIUM: MAGNESIUM: 1.4 mg/dL — AB (ref 1.7–2.4)

## 2016-07-24 MED ORDER — MAGNESIUM SULFATE 50 % IJ SOLN
3.0000 g | Freq: Once | INTRAVENOUS | Status: AC
  Administered 2016-07-24: 3 g via INTRAVENOUS
  Filled 2016-07-24: qty 6

## 2016-07-24 MED ORDER — TRAMADOL HCL 50 MG PO TABS: 50.0000 mg | ORAL_TABLET | Freq: Four times a day (QID) | ORAL | Status: DC | PRN

## 2016-07-24 MED ORDER — FUROSEMIDE 40 MG PO TABS
40.0000 mg | ORAL_TABLET | Freq: Every day | ORAL | Status: DC
  Administered 2016-07-24: 40 mg via ORAL
  Filled 2016-07-24: qty 1

## 2016-07-24 MED ORDER — FUROSEMIDE 10 MG/ML IJ SOLN
80.0000 mg | Freq: Once | INTRAMUSCULAR | Status: AC
  Administered 2016-07-24: 80 mg via INTRAVENOUS
  Filled 2016-07-24: qty 8

## 2016-07-24 MED ORDER — INSULIN ASPART 100 UNIT/ML ~~LOC~~ SOLN
4.0000 [IU] | Freq: Three times a day (TID) | SUBCUTANEOUS | Status: DC
  Administered 2016-07-24 (×2): 4 [IU] via SUBCUTANEOUS

## 2016-07-24 MED ORDER — FUROSEMIDE 80 MG PO TABS
80.0000 mg | ORAL_TABLET | Freq: Every day | ORAL | Status: DC
  Administered 2016-07-25: 80 mg via ORAL
  Filled 2016-07-24: qty 1

## 2016-07-24 NOTE — Progress Notes (Signed)
Heart Failure Navigator Consult Note  Presentation: [name] is a very pleasant 80 y.o. male with medical history significant CAD, heart failure, hypertension, diabetes, A. fib on anticoagulation with a history GI bleed, prostate cancer, history of stroke presents to the emergency Department chief complaint sudden onset shortness of breath. Initial evaluation reveals acute on chronic heart failure as well as acute on chronic kidney disease   Past Medical History:  Diagnosis Date  . Anemia 11/28/2015  . Atrial fibrillation ([place])   . Bipolar disorder ([place])   . CAD (coronary artery disease)    a.  NSTEMI treated with PCI Feb 2011 with a DES.([facility]);   b. cath 2/11: D1 stents x 2 ok, AV groove CFX occluded with dist AV CFX filled by L-L collats, RCA occluded, OM2 90-95% (treated with PCI)  . CKD (chronic kidney disease), stage III   . DM (diabetes mellitus) ([place])   . GERD (gastroesophageal reflux disease)   . GI bleed-recent SB study-AVMs   . Heart failure ([place])   . HTN (hypertension)    echo 2/11: EF 50-55%, diast dyfxn, severe LVH, inf HK, LAE  . Hyperlipidemia   . Hypertensive emergency 06/24/2014  . MI (myocardial infarction) ([place])   . Prostate cancer [facility])    status post radiation therapy in 2003  . Pulmonary edema   . PVD (peripheral vascular disease) ([place])   . Respiratory difficulty 06/24/2014   intubated   . Shortness of breath dyspnea   . Stroke [facility]) 07/2014    Social History   Social History  . Marital status: Divorced    Spouse name: N/A  . Number of children: N/A  . Years of education: N/A   Occupational History  . retired Architect    Social History Main Topics  . Smoking status: Current Every Day Smoker    Packs/day: 0.50    Years: 50.00    Types: Cigarettes  . Smokeless tobacco: Never Used     Comment: He has a 50-pack-year hx of tobacco abuse. Currently, smoking about half a pack a day.  . Alcohol use Yes     Comment: Previously drank  heavily, and now has a drink every other day or so.  . Drug use:     Types: Marijuana, Cocaine  . Sexual activity: No   Other Topics Concern  . None   Social History Narrative  . None    ECHO:Study Conclusions-05/12/16  - Left ventricle: The cavity size was mildly dilated. There was   moderate concentric hypertrophy. Systolic function was severely   reduced. The estimated ejection fraction was in the range of 25%   to 30%. Diffuse hypokinesis with akinesis of the basal   inferolateral wall. The study is not technically sufficient to   allow evaluation of LV diastolic function. - Aortic valve: Transvalvular velocity was within the normal range.   There was no stenosis. There was mild regurgitation. - Mitral valve: Mobility of the anterior and posterior leaflet was   restricted. There was mild regurgitation. - Left atrium: The atrium was severely dilated. - Right ventricle: The cavity size was normal. Wall thickness was   normal. Systolic function was moderately reduced. - Right atrium: The atrium was mildly dilated. - Atrial septum: No defect or patent foramen ovale was identified   by color flow Doppler. - Tricuspid valve: There was mild regurgitation. - Pulmonary arteries: Systolic pressure was moderately increased.   PA peak pressure: 51 mm Hg (S). - Inferior vena cava:  The vessel was dilated. The respirophasic   diameter changes were blunted (< 50%), consistent with elevated   central venous pressure.  Impressions:  - Unable to view echo from 07/2014, but LVEF appears to be reduced   since that time.  Transthoracic echocardiography.  M-mode, complete 2D, spectral Doppler, and color Doppler.  Birthdate:  Patient birthdate: July 25, 1936.  Age:  Patient is 80 yr old.  Sex:  Gender: male. BMI: 25.4 kg/m^2.  Blood pressure:     120/62  Patient status: Inpatient.  Study date:  Study date: 05/12/2016. Study time: 10:54 AM.  Location:  ICU/CCU  BNP    Component Value  Date/Time   BNP 1,454.7 (H) 07/22/2016 1340    ProBNP    Component Value Date/Time   PROBNP 12,301.0 (H) 10/18/2014 1743     Education Assessment and Provision:  Detailed education and instructions provided on heart failure disease management including the following:  Signs and symptoms of Heart Failure When to call the physician Importance of daily weights Low sodium diet Fluid restriction Medication management Anticipated future follow-up appointments  Patient education given on each of the above topics.  Patient acknowledges understanding and acceptance of all instructions.   I spoke with Mr. Yu regarding his hospitalization and HF diagnosis.  He tells me that he has a scale and has not been weighing because no one had ever instructed "me to do so".  I encouraged daily weights and explained how weight increases relate to signs and symptoms of HF.  He says an aid from [employer] does his cooking.  He tells me that he was told he could have a "tablespoon of salt" each day.  I attempted to explain that we recommend no more than 2000 mg of Sodium each day and that all foods contain Sodium therefore added salt was not recommended at all.  I also encouraged fluid restriction to no more than 2 liters per day.  He seemed not lack understanding and was quite persistent that he can remain doing what he had been doing "it was working" and "no one has ever told me all this".  I explained that his heart function had worsened and it will be necessary to modify his habits at home in order to remain as healthy as he can.  He denies any issues getting or taking prescribed medications at home.  He will follow with the AHF Clinic after discharge.  Education Materials:  "Living Better With Heart Failure" Booklet, Daily Weight Tracker Tool    High Risk Criteria for Readmission and/or Poor Patient Outcomes:  (Recommend Follow-up with Advanced Heart Failure Clinic)--yes   EF <30%- yes 25-30%  2 or  more admissions in 6 months- 5/6 mo  Difficult social situation- Yes-? ETOH intake/abuse  Demonstrates medication noncompliance- ? denies    Barriers of Care:  ?ETOH , Health Literacy, Knowledge and compliance  Discharge Planning:   Plans to return to home alone.  He has an aid through [employer] that helps him with meal prep and household chores.  He was previously being seen by [facility] and will benefit from referral [facility] again for symptom recognition and compliance recognition.  I will also refer him to the [employer] program for ongoing support due to his high risk for readmission and resistance to comply with HF recommendations.

## 2016-07-24 NOTE — Progress Notes (Signed)
Pt with 29 beats of V tach. Pt asymptomatic and resting in bed. MD paged. Orders received for Mag level. Will continue to monitor.

## 2016-07-24 NOTE — Progress Notes (Signed)
PROGRESS NOTE    [name]  XFG:[id number] DOB: [birth year] DOA: 07/22/2016 PCP: Beacher May, MD   Brief Narrative:  HPI on 07/22/2016 by Ms. Dyanne Carrel, NP [name] is a very pleasant 80 y.o. male with medical history significant CAD, heart failure, hypertension, diabetes, A. fib on anticoagulation with a history GI bleed, prostate cancer, history of stroke presents to the emergency Department chief complaint sudden onset shortness of breath. Initial evaluation reveals acute on chronic heart failure as well as acute on chronic kidney disease.  Information is obtained from the patient. He was discharged from the hospital 5 days ago after a 4 day stay for acute respiratory failure related to acute on chronic heart failure in the setting of acute on chronic renal failure. Chart review indicates he diuresed over 5 L and was sent home on 60 mg of Lasix. Patient reports he was on 80 mg prior to that hospitalization. He states he was doing "quite well" until last night when he was awakened suddenly with sudden shortness of breath. He denies chest pain palpitations lower extremity edema. He does endorse worsening orthopnea. He states his morning wore on he felt like he was improving around 11 AM was encouraged to come to the emergency department. He has not home oxygen but states "I was offered it at discharge last time and turned it down as I felt better". He denies headache visual disturbances syncope or near-syncope. He denies abdominal pain nausea vomiting diarrhea constipation melena bright red blood per rectum. He denies dysuria hematuria frequency or urgency. Does endorse a chronic productive cough that he's had "for 4 months since I got a bad cold and the cough never went away".  Assessment & Plan   Acute on chronic systolic heart failure/ischemic cardiomyopathy -Echocardiogram May 2017 showed an EF of 25-30% -Chest x-ray: Interstitial and basilar alveolar edema -BNP  1457 -Cardiology consulted and appreciated -Continue IV Lasix -Monitor intake and output, daily weights -Urine output 3576cc (net -2376) over past 24 hours -Continue metoprolol, hydralazine, Imdur  Acute on chronic cane disease, stage III -On admission creatinine was 3.27 -Patient has been taking NSAIDs for occasional neck pain -Creatinine currently improving, currently 2.69 -Continue to monitor BMP  Diabetes mellitus, type II -Continue insulin sliding scale, Lantus, CBG monitoring -Hemoglobin A1c on 07/09/2016 was 8.9 -Patient will need to follow-up with his primary care physician for better glucose control  Hyperlipidemia -Continue statin  Coronary artery disease -Patient did have left heart cath in 2011 with a drug-eluting stent placed in his circumflex -Troponin has been 0.06, flat -Cardiology following -Continue Eliquis, metoprolol, statin  Essential hypertension -Continue metoprolol, IV Lasix  Chronic atrial fibrillation -CHADSVASC 8 -Continue Eliquis  Bipolar disorder -Psychiatry consulted and started patient on Depakote for mood stabilization -Patient had been previously treated with lithium which he stopped  Alcohol abuse -Patient does drink approximately 1/2 gallon of liquor per week -Placed on CIWA protocol -Currently no symptoms of withdrawal  GERD -Continue PPI  Tobacco abuse -Discussed smoking cessation  DVT Prophylaxis  Eliquis  Code Status: Full  Family Communication: None at bedside  Disposition Plan: Admitted  Consultants Cardiology Psychiatry  Procedures  None  Antibiotics   Anti-infectives    None      Subjective:   [name] seen and examined today.  Patient denies any chest pain, abdominal pain, nausea or vomiting, dizziness or headache. Denies any current leg swelling. Feels his breathing has improved.  Objective:   Vitals:   07/23/16  7989 07/23/16 1712 07/23/16 2022 07/24/16 0530  BP: 134/84 126/72 109/69  103/65  Pulse: 71  (!) 53 79  Resp: '20  18 18  '$ Temp: 97.3 F (36.3 C)  98.2 F (36.8 C) 98.1 F (36.7 C)  TempSrc: Oral  Oral Oral  SpO2: 94%  95% 97%  Weight: 89 kg (196 lb 1.6 oz)   87.8 kg (193 lb 9.6 oz)  Height:        Intake/Output Summary (Last 24 hours) at 07/24/16 1300 Last data filed at 07/24/16 1013  Gross per 24 hour  Intake              960 ml  Output             3451 ml  Net            -2491 ml   Filed Weights   07/22/16 1855 07/23/16 0618 07/24/16 0530  Weight: 89.4 kg (197 lb) 89 kg (196 lb 1.6 oz) 87.8 kg (193 lb 9.6 oz)    Exam  General: Well developed, well nourished, NAD, appears stated age  70: NCAT,  mucous membranes moist.   Neck: Supple, + JVD, no masses  Cardiovascular: S1 S2 auscultated, irregular, no murmurs  Respiratory: Clear to auscultation bilaterally with equal chest rise  Abdomen: Soft, nontender, nondistended, + bowel sounds  Extremities: warm dry without cyanosis clubbing. +1 edema LLE  Neuro: AAOx3, nonfocal  Psych: Normal affect and demeanor with intact judgement and insight  Data Reviewed: I have personally reviewed following labs and imaging studies  CBC:  Recent Labs Lab 07/22/16 1340 07/22/16 1642 07/23/16 0352  WBC 6.1 5.4 5.7  NEUTROABS 4.0  --   --   HGB 11.6* 11.1* 10.0*  HCT 37.2* 35.3* 32.3*  MCV 84.5 84.7 83.5  PLT 244 228 211   Basic Metabolic Panel:  Recent Labs Lab 07/17/16 1322 07/17/16 1648 07/22/16 1340 07/22/16 1642 07/23/16 0352 07/24/16 0455  NA  --   --  136  --  133* 137  K  --   --  4.7  --  4.5 3.9  CL  --   --  101  --  99* 99*  CO2  --   --  26  --  28 31  GLUCOSE  --   --  214*  --  293* 202*  BUN  --   --  63*  --  66* 68*  CREATININE  --   --  3.29* 3.27* 3.09* 2.69*  CALCIUM  --   --  9.2  --  8.8* 9.3  MG 1.0* 2.6*  --   --   --  1.4*   GFR: Estimated Creatinine Clearance: 25.2 mL/min (by C-G formula based on SCr of 2.69 mg/dL). Liver Function Tests: No results  for input(s): AST, ALT, ALKPHOS, BILITOT, PROT, ALBUMIN in the last 168 hours. No results for input(s): LIPASE, AMYLASE in the last 168 hours. No results for input(s): AMMONIA in the last 168 hours. Coagulation Profile: No results for input(s): INR, PROTIME in the last 168 hours. Cardiac Enzymes:  Recent Labs Lab 07/22/16 1902 07/23/16 0352  TROPONINI 0.06* 0.06*   BNP (last 3 results) No results for input(s): PROBNP in the last 8760 hours. HbA1C: No results for input(s): HGBA1C in the last 72 hours. CBG:  Recent Labs Lab 07/23/16 1137 07/23/16 1659 07/23/16 2049 07/24/16 0606 07/24/16 1150  GLUCAP 201* 153* 99 184* 173*   Lipid Profile: No results for input(s):  CHOL, HDL, LDLCALC, TRIG, CHOLHDL, LDLDIRECT in the last 72 hours. Thyroid Function Tests: No results for input(s): TSH, T4TOTAL, FREET4, T3FREE, THYROIDAB in the last 72 hours. Anemia Panel: No results for input(s): VITAMINB12, FOLATE, FERRITIN, TIBC, IRON, RETICCTPCT in the last 72 hours. Urine analysis:    Component Value Date/Time   COLORURINE YELLOW 08/25/2014 [place] 08/25/2014 1602   LABSPEC 1.008 08/25/2014 1602   PHURINE 6.5 08/25/2014 1602   GLUCOSEU NEGATIVE 08/25/2014 1602   HGBUR TRACE (A) 08/25/2014 1602   BILIRUBINUR NEGATIVE 08/25/2014 1602   KETONESUR NEGATIVE 08/25/2014 1602   PROTEINUR NEGATIVE 08/25/2014 1602   UROBILINOGEN 1.0 08/25/2014 1602   NITRITE NEGATIVE 08/25/2014 1602   LEUKOCYTESUR NEGATIVE 08/25/2014 1602   Sepsis Labs: '@LABRCNTIP'$ (procalcitonin:4,lacticidven:4)  )No results found for this or any previous visit (from the past 240 hour(s)).    Radiology Studies: Dg Chest 2 View  Result Date: 07/22/2016 CLINICAL DATA:  Shortness of breath. EXAM: CHEST  2 VIEW COMPARISON:  07/22/2016. FINDINGS: The cardio pericardial silhouette is enlarged. Interstitial markings are diffusely coarsened with chronic features. Patchy airspace opacities seen at the lung  bases, right greater than left. High right paratracheal opacity may reflect ectasia of the arch vessel anatomy. IMPRESSION: Low volume film with cardiomegaly and features suggesting interstitial and basilar alveolar edema. Electronically Signed   By: Misty Stanley M.D.   On: 07/22/2016 15:07    Scheduled Meds: . apixaban  2.5 mg Oral Q12H  . atorvastatin  20 mg Oral QHS  . divalproex  250 mg Oral Q8H  . feeding supplement (GLUCERNA SHAKE)  237 mL Oral TID BM  . finasteride  5 mg Oral Daily  . folic acid  1 mg Oral Daily  . furosemide  40 mg Oral q1800  . [START ON 07/25/2016] furosemide  80 mg Oral QAC breakfast  . gabapentin  300 mg Oral BID  . hydrALAZINE  12.5 mg Oral Q8H  . insulin aspart  0-15 Units Subcutaneous TID WC  . insulin aspart  0-5 Units Subcutaneous QHS  . insulin aspart  4 Units Subcutaneous TID WC  . insulin glargine  30 Units Subcutaneous QHS  . isosorbide mononitrate  15 mg Oral Daily  . magnesium sulfate 1 - 4 g bolus IVPB  3 g Intravenous Once  . metoprolol succinate  50 mg Oral BID  . multivitamin with minerals  1 tablet Oral Daily  . pantoprazole  40 mg Oral Daily  . sodium chloride flush  3 mL Intravenous Q12H  . tamsulosin  0.4 mg Oral Daily  . thiamine  100 mg Oral Daily  . vitamin B-12  1,000 mcg Oral Daily   Continuous Infusions:    LOS: 2 days   Time Spent in minutes   30 minutes  [name] D.O. on 07/24/2016 at 1:00 PM  Between 7am to 7pm - Pager - [phone]  After 7pm go to [email or web address] - password [email or web address]  And look for the night coverage person covering for me after hours  [facility] Office  [phone]

## 2016-07-24 NOTE — Progress Notes (Signed)
Advanced Heart Failure Rounding Note   Subjective:    Yesterday diuresed with IV lasix and hydralazine/imdur added. Brisk diuresis noted. Weight down 3 pounds.   Denies SOB. Feeling better. Wants to go home.     Objective:   Weight Range:  Vital Signs:   Temp:  [98.1 F (36.7 C)-98.2 F (36.8 C)] 98.1 F (36.7 C) (07/28 0530) Pulse Rate:  [53-79] 79 (07/28 0530) Resp:  [18] 18 (07/28 0530) BP: (103-126)/(65-72) 103/65 (07/28 0530) SpO2:  [95 %-97 %] 97 % (07/28 0530) Weight:  [87.8 kg (193 lb 9.6 oz)] 87.8 kg (193 lb 9.6 oz) (07/28 0530) Last BM Date: 07/23/16  Weight change: Filed Weights   07/22/16 1855 07/23/16 0618 07/24/16 0530  Weight: 89.4 kg (197 lb) 89 kg (196 lb 1.6 oz) 87.8 kg (193 lb 9.6 oz)    Intake/Output:   Intake/Output Summary (Last 24 hours) at 07/24/16 0916 Last data filed at 07/24/16 0913  Gross per 24 hour  Intake             1440 ml  Output             3576 ml  Net            -2136 ml     Physical Exam: General:  Well appearing. No resp difficulty. Sitting on the side of the bed.  HEENT: normal Neck: supple. JVP ~9 . Carotids 2+ bilat; no bruits. No lymphadenopathy or thryomegaly appreciated. Cor: PMI nondisplaced. irregular rate & rhythm. No rubs, gallops or murmurs. Lungs: clear Abdomen: soft, nontender, nondistended. No hepatosplenomegaly. No bruits or masses. Good bowel sounds. Extremities: no cyanosis, clubbing, rash, LLE 1+ edema Neuro: alert & orientedx3, cranial nerves grossly intact. moves all 4 extremities w/o difficulty. Affect pleasant  Telemetry: Afib 70-80s.  Labs: Basic Metabolic Panel:  Recent Labs Lab 07/17/16 1038 07/17/16 1240 07/17/16 1322 07/17/16 1648 07/22/16 1340 07/22/16 1642 07/23/16 0352 07/24/16 0455  NA 136  --   --   --  136  --  133* 137  K 3.9  --   --   --  4.7  --  4.5 3.9  CL 96*  --   --   --  101  --  99* 99*  CO2 33*  --   --   --  26  --  28 31  GLUCOSE 138*  --   --   --  214*   --  293* 202*  BUN 33*  --   --   --  63*  --  66* 68*  CREATININE 2.09*  --   --   --  3.29* 3.27* 3.09* 2.69*  CALCIUM 9.2  --   --   --  9.2  --  8.8* 9.3  MG  --  1.1* 1.0* 2.6*  --   --   --  1.4*    Liver Function Tests: No results for input(s): AST, ALT, ALKPHOS, BILITOT, PROT, ALBUMIN in the last 168 hours. No results for input(s): LIPASE, AMYLASE in the last 168 hours. No results for input(s): AMMONIA in the last 168 hours.  CBC:  Recent Labs Lab 07/22/16 1340 07/22/16 1642 07/23/16 0352  WBC 6.1 5.4 5.7  NEUTROABS 4.0  --   --   HGB 11.6* 11.1* 10.0*  HCT 37.2* 35.3* 32.3*  MCV 84.5 84.7 83.5  PLT 244 228 191    Cardiac Enzymes:  Recent Labs Lab 07/22/16 1902 07/23/16 0352  TROPONINI 0.06*  0.06*    BNP: BNP (last 3 results)  Recent Labs  07/13/16 0550 07/17/16 1322 07/22/16 1340  BNP 1,283.3* 642.9* 1,454.7*    ProBNP (last 3 results) No results for input(s): PROBNP in the last 8760 hours.    Other results:  Imaging: Dg Chest 2 View  Result Date: 07/22/2016 CLINICAL DATA:  Shortness of breath. EXAM: CHEST  2 VIEW COMPARISON:  07/22/2016. FINDINGS: The cardio pericardial silhouette is enlarged. Interstitial markings are diffusely coarsened with chronic features. Patchy airspace opacities seen at the lung bases, right greater than left. High right paratracheal opacity may reflect ectasia of the arch vessel anatomy. IMPRESSION: Low volume film with cardiomegaly and features suggesting interstitial and basilar alveolar edema. Electronically Signed   By: Misty Stanley M.D.   On: 07/22/2016 15:07     Medications:     Scheduled Medications: . apixaban  2.5 mg Oral Q12H  . atorvastatin  20 mg Oral QHS  . divalproex  250 mg Oral Q8H  . feeding supplement (GLUCERNA SHAKE)  237 mL Oral TID BM  . finasteride  5 mg Oral Daily  . folic acid  1 mg Oral Daily  . furosemide  80 mg Intravenous BID  . gabapentin  300 mg Oral BID  . hydrALAZINE  12.5 mg  Oral Q8H  . insulin aspart  0-15 Units Subcutaneous TID WC  . insulin aspart  0-5 Units Subcutaneous QHS  . insulin aspart  4 Units Subcutaneous TID WC  . insulin glargine  30 Units Subcutaneous QHS  . isosorbide mononitrate  15 mg Oral Daily  . magnesium sulfate 1 - 4 g bolus IVPB  3 g Intravenous Once  . metoprolol succinate  50 mg Oral BID  . multivitamin with minerals  1 tablet Oral Daily  . pantoprazole  40 mg Oral Daily  . sodium chloride flush  3 mL Intravenous Q12H  . tamsulosin  0.4 mg Oral Daily  . thiamine  100 mg Oral Daily  . vitamin B-12  1,000 mcg Oral Daily     Infusions:     PRN Medications:  [DISCONTINUED] acetaminophen **OR** acetaminophen, albuterol, guaiFENesin-dextromethorphan, ondansetron **OR** ondansetron (ZOFRAN) IV, simethicone   Assessment/Plan   1. A/C Systolic Heart Failure: Ischemic cardiomyopathy.  NYHA IIIb symptoms.  ECHO May 2017 EF 25-30% which is down from previous 40-45% in May 2015. Last LHC 2011 DES to circ and totally occluded RCA. Would not cath with elevated serum creatinine. Could get Myoview to rule out large area of ischemia. On admit at had CXR with edema.    Volume status improving. Give another dose of IV lasix then start po 80 mg/40 mg with first dose tonight.  - Continue Toprol XL 50 mg twice a day.  - Continue 25 mg hydralazine tid and 30 mg imdur daily.  - No Dig, spiro, ace with CKD 2. A/C CKD: Creatinine baseline  ~2. On admit creatinine 3.27. Has been taking NSAIDs on occasion. Instructed to avoid NSAIDs. Watch closely with diuresis.  Todays creatinine 2.69.  3. CAD: LHC 2011 with DES to circ and totally occluded RCA. Troponin  0.06>0.06. On eliquis, bb, and statin.  4. Chronic A fib: Rate controlled. On bb. On eliquis 2.5 mg twice a day. No evidence of bleeding. Hgb stable.  5. H/O GI bleed: Recent, remains on anticoagulation and hemoglobin stable.    6. Bipolar Disorder: Psychiatry consulted. 7. NSVT- Mag 1.4 Give 3  grams Mag . K 3.9.  8. ETOH abuse- drink 1/2  gallon of liquor a week. Counseled to stop.    He will need closed follow up. Once discharged will need Paramedicine. I have referred. Also outpatient follow up in the HF clinic.   Ambulate.    Length of Stay: 2   Amy Clegg NP_C  07/24/2016, 9:16 AM  Advanced Heart Failure Team Pager [phone] (M-F; [place])  Please contact [facility] for night-coverage after hours (4p -7a ) and weekends on [email or web address]  Patient seen with NP, agree with the above note.  He is doing well today, diuresed with weight down.  Creatinine down as well.  Suspect he needs 1 more dose diuretic then to po tonight.  Home soon, ?tomorrow.   CHF clinic followup.   Loralie Champagne 07/24/2016 9:54 AM

## 2016-07-24 NOTE — Progress Notes (Signed)
Nutrition Education Note  RD consulted for nutrition education regarding CHF.  RD has met with patient for low sodium diet education in the past. RD provided "Heart Failure Nutrition Therapy" handout from the [employer]. Pt states that he was under the impression that he could use one teaspoon of salt per day; states that he can't eat without adding some salt. He has had a poor appetite for the past few months and now only eats 2 meals daily. Reviewed patient's dietary recall. Provided examples on ways to decrease sodium intake in diet. Discouraged intake of processed foods and use of salt shaker. Encouraged fresh fruits and vegetables as well as whole grain sources of carbohydrates to maximize fiber intake. Discussed tips for flavoring food without adding salt. Pt states that he already plans to eat cereal and milk for breakfast and plans to start using frozen vegetables instead of canned. We discussed 4 major points. 1) No ham, bologna, or salami. Choose low sodium chicken or [place] instead. 2) Switch from American cheese to a lower sodium cheese such as swiss or provolone. 3) Choose snack foods, such as chips or crackers, that have less than 140 mg of sodium per serving. Less sodium is better! 4) Don't add any more than 1/4 teaspoon of salt to your food, including while cooking. 1/4 teaspoon=600 mg sodium.  RD discussed why it is important for patient to adhere to diet recommendations, and emphasized the role of fluids, foods to avoid, and importance of weighing self daily. Teach back method used. Pt very difficult to assess and required lots of repetition. RD spent > 1 hour on education with patient. Encouraged pt to share handouts with his daughter and his aide.   Expect fair compliance.  Body mass index is 26.26 kg/m. Pt meets criteria for Overweight based on current BMI. He states that his appetite has picked up recently and he has been maintaining 190 lbs.   Current  diet order is Heart Healthy/Carb Modified, patient is consuming approximately 75-100% of meals at this time. Labs and medications reviewed. No further nutrition interventions warranted at this time. RD contact information provided. If additional nutrition issues arise, please re-consult RD.   Scarlette Ar RD, LDN Inpatient Clinical Dietitian Pager: [phone] After Hours Pager: [phone]

## 2016-07-25 DIAGNOSIS — R7989 Other specified abnormal findings of blood chemistry: Secondary | ICD-10-CM

## 2016-07-25 LAB — BASIC METABOLIC PANEL
ANION GAP: 7 (ref 5–15)
BUN: 63 mg/dL — ABNORMAL HIGH (ref 6–20)
CALCIUM: 9.1 mg/dL (ref 8.9–10.3)
CHLORIDE: 95 mmol/L — AB (ref 101–111)
CO2: 34 mmol/L — AB (ref 22–32)
Creatinine, Ser: 2.62 mg/dL — ABNORMAL HIGH (ref 0.61–1.24)
GFR calc non Af Amer: 22 mL/min — ABNORMAL LOW (ref >60)
GFR, EST AFRICAN AMERICAN: 25 mL/min — AB (ref >60)
Glucose, Bld: 261 mg/dL — ABNORMAL HIGH (ref 65–99)
POTASSIUM: 4.2 mmol/L (ref 3.5–5.1)
Sodium: 136 mmol/L (ref 135–145)

## 2016-07-25 LAB — GLUCOSE, CAPILLARY
GLUCOSE-CAPILLARY: 311 mg/dL — AB (ref 65–99)
GLUCOSE-CAPILLARY: 129 mg/dL — AB (ref 65–99)
Glucose-Capillary: 117 mg/dL — ABNORMAL HIGH (ref 65–99)

## 2016-07-25 LAB — MAGNESIUM: Magnesium: 1.7 mg/dL (ref 1.7–2.4)

## 2016-07-25 MED ORDER — FUROSEMIDE 20 MG PO TABS: ORAL_TABLET | ORAL | 0 refills | Status: DC

## 2016-07-25 MED ORDER — HYDRALAZINE HCL 25 MG PO TABS: 12.5000 mg | ORAL_TABLET | Freq: Three times a day (TID) | ORAL | 0 refills | Status: DC

## 2016-07-25 MED ORDER — ISOSORBIDE MONONITRATE ER 30 MG PO TB24: 15.0000 mg | ORAL_TABLET | Freq: Every day | ORAL | 0 refills | Status: DC

## 2016-07-25 MED ORDER — THIAMINE HCL 100 MG PO TABS: 100.0000 mg | ORAL_TABLET | Freq: Every day | ORAL | 0 refills | Status: AC

## 2016-07-25 MED ORDER — MAGNESIUM SULFATE 2 GM/50ML IV SOLN
2.0000 g | Freq: Once | INTRAVENOUS | Status: AC
Start: 2016-07-25 — End: 2016-07-25
  Administered 2016-07-25: 2 g via INTRAVENOUS
  Filled 2016-07-25: qty 50

## 2016-07-25 MED ORDER — FOLIC ACID 1 MG PO TABS: 1.0000 mg | ORAL_TABLET | Freq: Every day | ORAL | 0 refills | Status: AC

## 2016-07-25 MED ORDER — DIVALPROEX SODIUM 250 MG PO DR TAB: 250.0000 mg | DELAYED_RELEASE_TABLET | Freq: Three times a day (TID) | ORAL | 0 refills | Status: DC

## 2016-07-25 NOTE — Progress Notes (Signed)
Patient ID: DENVIL CANNING, male   DOB: 1936/12/15, 80 y.o.   MRN: [id number]    Patient Name: [name] Date of Encounter: 07/25/2016     Principal Problem:   Acute on chronic combined systolic (congestive) and diastolic (congestive) heart failure (HCC) Active Problems:   DM (diabetes mellitus), type 2 with renal complications (HCC)   Hyperlipidemia associated with type 2 diabetes mellitus ([facility])   Essential hypertension   CAD- last PCI 2011   GERD   Edema   Chronic atrial fibrillation (HCC)   Tobacco abuse   CKD (chronic kidney disease), stage III   Demand ischemia (HCC)   Chronic anticoagulation-Eliquis   Elevated troponin   Acute on chronic kidney failure (HCC)   Acute on chronic congestive heart failure (HCC)   Bipolar disorder (HCC)    SUBJECTIVE  No chest pain or sob. He feels like he is back to normal.  CURRENT MEDS . apixaban  2.5 mg Oral Q12H  . atorvastatin  20 mg Oral QHS  . divalproex  250 mg Oral Q8H  . feeding supplement (GLUCERNA SHAKE)  237 mL Oral TID BM  . finasteride  5 mg Oral Daily  . folic acid  1 mg Oral Daily  . furosemide  40 mg Oral q1800  . furosemide  80 mg Oral QAC breakfast  . gabapentin  300 mg Oral BID  . hydrALAZINE  12.5 mg Oral Q8H  . insulin aspart  0-15 Units Subcutaneous TID WC  . insulin aspart  0-5 Units Subcutaneous QHS  . insulin aspart  4 Units Subcutaneous TID WC  . insulin glargine  30 Units Subcutaneous QHS  . isosorbide mononitrate  15 mg Oral Daily  . magnesium sulfate 1 - 4 g bolus IVPB  2 g Intravenous Once  . metoprolol succinate  50 mg Oral BID  . multivitamin with minerals  1 tablet Oral Daily  . pantoprazole  40 mg Oral Daily  . sodium chloride flush  3 mL Intravenous Q12H  . tamsulosin  0.4 mg Oral Daily  . thiamine  100 mg Oral Daily  . vitamin B-12  1,000 mcg Oral Daily    OBJECTIVE  Vitals:   07/24/16 0530 07/24/16 1649 07/24/16 2041 07/25/16 0509  BP: 103/65 122/63 121/76 (!) 121/53    Pulse: 79  63   Resp: '18  18 20  '$ Temp: 98.1 F (36.7 C)  98.4 F (36.9 C) 98.2 F (36.8 C)  TempSrc: Oral  Oral Oral  SpO2: 97%  95% 100%  Weight: 193 lb 9.6 oz (87.8 kg)   189 lb 6.4 oz (85.9 kg)  Height:        Intake/Output Summary (Last 24 hours) at 07/25/16 1150 Last data filed at 07/25/16 0423  Gross per 24 hour  Intake              480 ml  Output             3250 ml  Net            -2770 ml   Filed Weights   07/23/16 0618 07/24/16 0530 07/25/16 0509  Weight: 196 lb 1.6 oz (89 kg) 193 lb 9.6 oz (87.8 kg) 189 lb 6.4 oz (85.9 kg)    PHYSICAL EXAM  General: Pleasant, NAD. Neuro: Alert and oriented X 3. Moves all extremities spontaneously. Psych: Normal affect. HEENT:  Normal  Neck: Supple without bruits 7 cm JVD. Lungs:  Resp regular and unlabored, CTA. Heart: RRR  no s3, s4, or murmurs. Abdomen: Soft, non-tender, non-distended, BS + x 4.  Extremities: No clubbing, cyanosis or edema. DP/PT/Radials 2+ and equal bilaterally.  Accessory Clinical Findings  CBC  Recent Labs  07/22/16 1340 07/22/16 1642 07/23/16 0352  WBC 6.1 5.4 5.7  NEUTROABS 4.0  --   --   HGB 11.6* 11.1* 10.0*  HCT 37.2* 35.3* 32.3*  MCV 84.5 84.7 83.5  PLT 244 228 850   Basic Metabolic Panel  Recent Labs  07/24/16 0455 07/25/16 0217  NA 137 136  K 3.9 4.2  CL 99* 95*  CO2 31 34*  GLUCOSE 202* 261*  BUN 68* 63*  CREATININE 2.69* 2.62*  CALCIUM 9.3 9.1  MG 1.4* 1.7   Liver Function Tests No results for input(s): AST, ALT, ALKPHOS, BILITOT, PROT, ALBUMIN in the last 72 hours. No results for input(s): LIPASE, AMYLASE in the last 72 hours. Cardiac Enzymes  Recent Labs  07/22/16 1902 07/23/16 0352  TROPONINI 0.06* 0.06*   BNP Invalid input(s): POCBNP D-Dimer No results for input(s): DDIMER in the last 72 hours. Hemoglobin A1C No results for input(s): HGBA1C in the last 72 hours. Fasting Lipid Panel No results for input(s): CHOL, HDL, LDLCALC, TRIG, CHOLHDL, LDLDIRECT  in the last 72 hours. Thyroid Function Tests No results for input(s): TSH, T4TOTAL, T3FREE, THYROIDAB in the last 72 hours.  Invalid input(s): FREET3  TELE  nsr  Radiology/Studies  Dg Chest 2 View  Result Date: 07/22/2016 CLINICAL DATA:  Shortness of breath. EXAM: CHEST  2 VIEW COMPARISON:  07/22/2016. FINDINGS: The cardio pericardial silhouette is enlarged. Interstitial markings are diffusely coarsened with chronic features. Patchy airspace opacities seen at the lung bases, right greater than left. High right paratracheal opacity may reflect ectasia of the arch vessel anatomy. IMPRESSION: Low volume film with cardiomegaly and features suggesting interstitial and basilar alveolar edema. Electronically Signed   By: Misty Stanley M.D.   On: 07/22/2016 15:07  Dg Chest 2 View  Result Date: 07/16/2016 CLINICAL DATA:  Sob  Today,,weakness EXAM: CHEST  2 VIEW COMPARISON:  07/13/2016 FINDINGS: Apical lordotic positioning. Heart size is mildly enlarged. There is persistent opacity at the right lateral lung base. Improved aeration in the left lung. Suspect right pleural effusion. IMPRESSION: Improved aeration on the left.  Persistent opacity on the right. Electronically Signed   By: Nolon Nations M.D.   On: 07/16/2016 15:05   Dg Chest 2 View  Result Date: 07/13/2016 CLINICAL DATA:  Shortness of breath and cough. EXAM: CHEST  2 VIEW COMPARISON:  05/11/2016 FINDINGS: Chronic cardiopericardial enlargement. Small pleural effusions, fissural thickening, interstitial opacity. Patchy bilateral lung opacification, greater on the right. Stable aortic contours with atherosclerotic calcification. IMPRESSION: 1. CHF. 2. Asymmetric right-sided alveolar edema versus superimposed pneumonia. Electronically Signed   By: Monte Fantasia M.D.   On: 07/13/2016 06:18    ASSESSMENT AND PLAN  1. Acute on chronic systolic heart failure - he is euvolemic or nearly so and stable for discharge home. Followup in 2-3 weeks  with BMP. I would discharge on lasix 80/40 am/pm. 2. Chronic renal insufficiency - his creatinine is stable. He will need BMP as above.  Gregg Taylor,M.D.  07/25/2016 11:50 AM

## 2016-07-25 NOTE — Discharge Summary (Signed)
Physician Discharge Summary  [name]:[id number] DOB: 01-04-1936 DOA: 07/22/2016  PCP: Beacher May, MD  Admit date: 07/22/2016 Discharge date: 07/25/2016  Time spent: 45 minutes  Recommendations for Outpatient Follow-up:  Patient will be discharged to home.  Patient will need to follow up with primary care provider within one week of discharge. Follow up with cardiology, repeat BMP in 2-3 weeks. Patient should continue medications as prescribed.  Patient should follow a heart healthy/carb modified diet.   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 Dyanne Carrel, NP Louellen Molder Pauldingis a very pleasant 80 y.o.malewith medical history significant CAD, heart failure, hypertension, diabetes, A. fib on anticoagulation with a history GI bleed, prostate cancer, history of stroke presents to the emergency Department chief complaint sudden onset shortness of breath. Initial evaluation reveals acute on chronic heart failure as well as acute on chronic kidney disease.  Information is obtained from the patient. He was discharged from the hospital 5 days ago after a 4 day stay for acute respiratory failure related to acute on chronic heart failure in the setting of acute on chronic renal failure. Chart review indicates he diuresed over 5 L and was sent home on 60 mg of Lasix. Patient reports he was on 80 mg prior to that hospitalization. He states he was doing  "quite well" until last night when he was awakened suddenly with sudden shortness of breath. He denies chest pain palpitations lower extremity edema. He does endorse worsening orthopnea. He states his morning wore on he felt like he was improving around 11 AM was encouraged to come to the emergency department. He has not home oxygen but states "I was offered it at discharge last time and turned it down as I felt better". He denies headache visual disturbances syncope or near-syncope. He denies abdominal pain nausea vomiting diarrhea constipation melena bright red blood per rectum. He denies dysuria hematuria frequency or urgency. Does endorse a chronic productive cough that he's had "for 4 months since I got a bad cold and the cough never went away".  Hospital Course:  Acute on chronic systolic heart failure/ischemic cardiomyopathy -Echocardiogram May 2017 showed an EF of 25-30% -Chest x-ray: Interstitial and basilar alveolar edema -BNP 1457 -Cardiology consulted and appreciated -Was placed on IV Lasix -Monitor intake and output, daily weights -Urine output 3600cc (net -2880) over past 24 hours -Continue metoprolol, hydralazine, Imdur -Cardiology recommended PO lasix '80mg'$  qAM, '40mg'$  qPM. 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, stage III -On admission creatinine was 3.27 -Patient has been taking NSAIDs for occasional neck pain -Creatinine currently improving, currently 2.62 -Continue to monitor BMP  Diabetes mellitus, type II -Continue insulin sliding scale, Lantus, CBG monitoring -Hemoglobin A1c on 07/09/2016 was 8.9 -Patient will need to follow-up with his primary care physician for better glucose control  Hyperlipidemia -Continue statin  Coronary artery disease -Patient did have left heart cath in 2011 with a drug-eluting stent placed in his circumflex -Troponin has been 0.06, flat -Cardiology following -  Continue Eliquis, metoprolol, statin  Essential  hypertension -Continue metoprolol, IV Lasix  Chronic atrial fibrillation -CHADSVASC 8 -Continue Eliquis  Bipolar disorder -Psychiatry consulted and started patient on Depakote for mood stabilization -Patient had been previously treated with lithium which he stopped  Alcohol abuse -Patient does drink approximately 1/2 gallon of liquor per week -Placed on CIWA protocol -Currently no symptoms of withdrawal  GERD -Continue PPI  Tobacco abuse -Discussed smoking cessation  Consultants Cardiology Psychiatry  Procedures  None  Discharge Exam: Vitals:   07/24/16 2041 07/25/16 0509  BP: 121/76 (!) 121/53  Pulse: 63   Resp: 18 20  Temp: 98.4 F (36.9 C) 98.2 F (36.8 C)    Exam  General: Well developed, well nourished, NAD, appears stated age  HEENT: NCAT,  mucous membranes moist.   Neck: Supple, + JVD, no masses  Cardiovascular: S1 S2 auscultated, irregular, no murmurs  Respiratory: Clear to auscultation bilaterally with equal chest rise  Abdomen: Soft, nontender, nondistended, + bowel sounds  Extremities: warm dry without cyanosis clubbing, edema  Neuro: AAOx3, nonfocal  Psych: Normal affect and demeanor with intact judgement and insight  Discharge Instructions  Discharge Instructions    Discharge instructions    Complete by:  As directed   Patient will be discharged to home.  Patient will need to follow up with primary care provider within one week of discharge. Follow up with cardiology, repeat BMP in 2-3 weeks. Patient should continue medications as prescribed.  Patient should follow a heart healthy/carb modified diet.   You were cared for by a hospitalist during your hospital stay. If you have any questions about your discharge medications or the care you received while you were in the hospital after you are discharged, you can call the unit and asked to speak with the hospitalist on call if the hospitalist that took care of you is not available.  Once you are discharged, your primary care physician will handle any further medical issues. Please note that NO REFILLS for any discharge medications will be authorized once you are discharged, as it is imperative that you return to your primary care physician (or establish a relationship with a primary care physician if you do not have one) for your aftercare needs so that they can reassess your need for medications and monitor your lab values.       Medication List    STOP taking these medications   levofloxacin 500 MG tablet Commonly known as:  LEVAQUIN   vitamin B-12 1000 MCG tablet Commonly known as:  CYANOCOBALAMIN     TAKE these medications   acetaminophen 325 MG tablet Commonly known as:  TYLENOL Take 650 mg by mouth every 8 (eight) hours as needed for mild pain or fever (pain).   albuterol 108 (90 Base) MCG/ACT inhaler Commonly known as:  PROVENTIL HFA;VENTOLIN HFA Inhale 2 puffs into the lungs every 6 (six) hours as needed for shortness of breath.   ammonium lactate 12 % lotion Commonly known as:  LAC-HYDRIN Apply 1 application topically daily.   apixaban 2.5 MG Tabs tablet Commonly known as:  ELIQUIS Take 2.5 mg by mouth every 12 (twelve) hours.   atorvastatin 40 MG tablet Commonly known as:  LIPITOR Take 1 tablet (40 mg total) by mouth daily. What changed:  how much to take  when to take this   cilostazol 50 MG tablet Commonly known as:  PLETAL Take 50 mg by mouth every 12 (twelve) hours.   dextrose 40 % Gel Commonly  known as:  GLUTOSE Take 1 Tube by mouth daily as needed for low blood sugar.   divalproex 250 MG DR tablet Commonly known as:  DEPAKOTE Take 1 tablet (250 mg total) by mouth every 8 (eight) hours.   feeding supplement (GLUCERNA SHAKE) Liqd Take 237 mLs by mouth 3 (three) times daily between meals.   finasteride 5 MG tablet Commonly known as:  PROSCAR Take 5 mg by mouth daily.   folic acid 1 MG tablet Commonly known as:   FOLVITE Take 1 tablet (1 mg total) by mouth daily.   furosemide 20 MG tablet Commonly known as:  LASIX Use '80mg'$  (4 tablets) every morning and '40mg'$  (2 tablets) every evening. What changed:  medication strength  how much to take  how to take this  when to take this  additional instructions   gabapentin 300 MG capsule Commonly known as:  NEURONTIN Take 300 mg by mouth 2 (two) times daily.   guaifenesin 400 MG Tabs tablet Commonly known as:  HUMIBID E Take 400 mg by mouth 3 (three) times daily as needed. For congestion   hydrALAZINE 25 MG tablet Commonly known as:  APRESOLINE Take 0.5 tablets (12.5 mg total) by mouth every 8 (eight) hours.   insulin glargine 100 UNIT/ML injection Commonly known as:  LANTUS Inject 40 Units into the skin daily.   insulin regular 100 units/mL injection Commonly known as:  NOVOLIN R,HUMULIN R Inject 5 Units into the skin See admin instructions. Inject 5 units under the skin before breakfast and inject 5 units before evening meal   isosorbide mononitrate 30 MG 24 hr tablet Commonly known as:  IMDUR Take 0.5 tablets (15 mg total) by mouth daily.   metoprolol 50 MG tablet Commonly known as:  LOPRESSOR Take 50 mg by mouth 2 (two) times daily.   multivitamin with minerals tablet Take 1 tablet by mouth daily.   nystatin 100000 UNIT/ML suspension Commonly known as:  MYCOSTATIN Take 5 mLs (500,000 Units total) by mouth 4 (four) times daily.   pantoprazole 40 MG tablet Commonly known as:  PROTONIX Take 1 tablet (40 mg total) by mouth daily.   simethicone 80 MG chewable tablet Commonly known as:  MYLICON Chew 80 mg by mouth 3 (three) times daily as needed for flatulence. Take with meals as needed   tamsulosin 0.4 MG Caps capsule Commonly known as:  FLOMAX Take 0.4 mg by mouth daily.   thiamine 100 MG tablet Take 1 tablet (100 mg total) by mouth daily.      Allergies  Allergen Reactions  . Penicillins Swelling    Has patient had  a PCN reaction causing immediate rash, facial/tongue/throat swelling, SOB or lightheadedness with hypotension: Yes Has patient had a PCN reaction causing severe rash involving mucus membranes or skin necrosis: No Has patient had a PCN reaction that required hospitalization: No Has patient had a PCN reaction occurring within the last 10 years: No If all of the above answers are "NO", then may proceed with Cephalosporin use.   Follow-up Information    [facility] .   Why:  They will continue to do your home health care at your home Contact information: [place] [address] [phone]        [facility]. Go on 08/12/2016.   Specialty:  Cardiology Why:  Please come to the [facility] at 3:20 PM.  Bring all medications to appt .  Gate  code is Banker information: [address] [id number] [place] [address] [phone]       Beacher May, MD. Schedule an appointment as soon as possible for a visit in 1 week(s).   Specialty:  Internal Medicine Why:  Hospital follow up Contact information: [place] [address] [phone]            The results of significant diagnostics from this hospitalization (including imaging, microbiology, ancillary and laboratory) are listed below for reference.    Significant Diagnostic Studies: Dg Chest 2 View  Result Date: 07/22/2016 CLINICAL DATA:  Shortness of breath. EXAM: CHEST  2 VIEW COMPARISON:  07/22/2016. FINDINGS: The cardio pericardial silhouette is enlarged. Interstitial markings are diffusely coarsened with chronic features. Patchy airspace opacities seen at the lung bases, right greater than left. High right paratracheal opacity may reflect ectasia of the arch vessel anatomy. IMPRESSION: Low volume film with cardiomegaly and features suggesting interstitial and basilar alveolar  edema. Electronically Signed   By: Misty Stanley M.D.   On: 07/22/2016 15:07  Dg Chest 2 View  Result Date: 07/16/2016 CLINICAL DATA:  Sob  Today,,weakness EXAM: CHEST  2 VIEW COMPARISON:  07/13/2016 FINDINGS: Apical lordotic positioning. Heart size is mildly enlarged. There is persistent opacity at the right lateral lung base. Improved aeration in the left lung. Suspect right pleural effusion. IMPRESSION: Improved aeration on the left.  Persistent opacity on the right. Electronically Signed   By: Nolon Nations M.D.   On: 07/16/2016 15:05   Dg Chest 2 View  Result Date: 07/13/2016 CLINICAL DATA:  Shortness of breath and cough. EXAM: CHEST  2 VIEW COMPARISON:  05/11/2016 FINDINGS: Chronic cardiopericardial enlargement. Small pleural effusions, fissural thickening, interstitial opacity. Patchy bilateral lung opacification, greater on the right. Stable aortic contours with atherosclerotic calcification. IMPRESSION: 1. CHF. 2. Asymmetric right-sided alveolar edema versus superimposed pneumonia. Electronically Signed   By: Monte Fantasia M.D.   On: 07/13/2016 06:18    Microbiology: No results found for this or any previous visit (from the past 240 hour(s)).   Labs: Basic Metabolic Panel:  Recent Labs Lab 07/22/16 1340 07/22/16 1642 07/23/16 0352 07/24/16 0455 07/25/16 0217  NA 136  --  133* 137 136  K 4.7  --  4.5 3.9 4.2  CL 101  --  99* 99* 95*  CO2 26  --  28 31 34*  GLUCOSE 214*  --  293* 202* 261*  BUN 63*  --  66* 68* 63*  CREATININE 3.29* 3.27* 3.09* 2.69* 2.62*  CALCIUM 9.2  --  8.8* 9.3 9.1  MG  --   --   --  1.4* 1.7   Liver Function Tests: No results for input(s): AST, ALT, ALKPHOS, BILITOT, PROT, ALBUMIN in the last 168 hours. No results for input(s): LIPASE, AMYLASE in the last 168 hours. No results for input(s): AMMONIA in the last 168 hours. CBC:  Recent Labs Lab 07/22/16 1340 07/22/16 1642 07/23/16 0352  WBC 6.1 5.4 5.7  NEUTROABS 4.0  --   --   HGB 11.6*  11.1* 10.0*  HCT 37.2* 35.3* 32.3*  MCV 84.5 84.7 83.5  PLT 244 228 191   Cardiac Enzymes:  Recent Labs Lab 07/22/16 1902 07/23/16 0352  TROPONINI 0.06* 0.06*   BNP: BNP (last 3 results)  Recent Labs  07/13/16 0550 07/17/16 1322 07/22/16 1340  BNP 1,283.3* 642.9* 1,454.7*    ProBNP (last 3 results) No results for input(s): PROBNP in the last 8760  hours.  CBG:  Recent Labs Lab 07/24/16 1641 07/24/16 2210 07/25/16 0649 07/25/16 0944 07/25/16 1138  GLUCAP 148* 79 311* 129* 117*       Signed:  Cristal Ford  [facility] 07/25/2016, 12:31 PM

## 2016-07-30 ENCOUNTER — Ambulatory Visit (HOSPITAL_COMMUNITY)
Admission: EM | Admit: 2016-07-30 | Discharge: 2016-07-30 | Disposition: A | Discharge status: Home / Self Care | Payer: Medicare Other | Attending: Family Medicine | Admitting: Family Medicine

## 2016-07-30 ENCOUNTER — Encounter (HOSPITAL_COMMUNITY): Payer: Self-pay | Admitting: Emergency Medicine

## 2016-07-30 ENCOUNTER — Telehealth (HOSPITAL_COMMUNITY): Payer: Self-pay | Admitting: Emergency Medicine

## 2016-07-30 DIAGNOSIS — N390 Urinary tract infection, site not specified: Secondary | ICD-10-CM

## 2016-07-30 MED ORDER — CIPROFLOXACIN HCL 250 MG PO TABS: 250.0000 mg | ORAL_TABLET | Freq: Two times a day (BID) | ORAL | 0 refills | Status: DC

## 2016-07-30 NOTE — ED Provider Notes (Signed)
[facility]    CSN: [id number] Arrival date & time: 07/30/16  1349  First Provider Contact:  First MD Initiated Contact with Patient 07/30/16 1411    History   Chief Complaint Chief Complaint  Patient presents with  . Urinary Tract Infection    HPI [name] is a 80 y.o. male with a history of CAD s/p PCI, AFib on eliquis, CHF (EF 30-35%), COPD, and recent hospitalization for CHF, presenting for dysuria.   He reports 2 days of worsening burning with urination. Denies change in frequency, urgency, or urinary character. He has taken all medications prescribed since discharge. Had a condom cath during recent hospitalization, but no indwelling cath. History of UTI in the remote past, treated with abx. He denies fever, abd pain, malaise, fatigue, appetite change, weight gain/loss, N/V/D/C. Legs swollen, unchanged from discharge.   HPI  Past Medical History:  Diagnosis Date  . Anemia 11/28/2015  . Atrial fibrillation ([place])   . Bipolar disorder ([place])   . CAD (coronary artery disease)    a.  NSTEMI treated with PCI Feb 2011 with a DES.([facility]);   b. cath 2/11: D1 stents x 2 ok, AV groove CFX occluded with dist AV CFX filled by L-L collats, RCA occluded, OM2 90-95% (treated with PCI)  . CKD (chronic kidney disease), stage III   . DM (diabetes mellitus) ([place])   . GERD (gastroesophageal reflux disease)   . GI bleed-recent SB study-AVMs   . Heart failure ([place])   . HTN (hypertension)    echo 2/11: EF 50-55%, diast dyfxn, severe LVH, inf HK, LAE  . Hyperlipidemia   . Hypertensive emergency 06/24/2014  . MI (myocardial infarction) ([place])   . Prostate cancer [facility])    status post radiation therapy in 2003  . Pulmonary edema   . PVD (peripheral vascular disease) ([place])   . Respiratory difficulty 06/24/2014   intubated   . Shortness of breath dyspnea   . Stroke [facility]) 07/2014    Patient Active Problem List   Diagnosis Date Noted  . Acute on chronic congestive heart  failure ([place]) 07/22/2016  . Acute on chronic combined systolic (congestive) and diastolic (congestive) heart failure ([place]) 07/22/2016  . Bipolar disorder ([place])   . Alcohol abuse 07/09/2016  . Demand ischemia ([place]) 05/11/2016  . Chronic anticoagulation-Eliquis 05/11/2016  . Elevated troponin 05/11/2016  . Acute on chronic kidney failure ([place]) 05/11/2016  . Symptomatic anemia 05/11/2016  . Anemia 11/28/2015  . CVA (cerebral infarction)-Augb 2015 08/25/2014  . Stroke ([place]) 08/25/2014  . CKD (chronic kidney disease), stage III 07/17/2014  . Acute respiratory failure with hypoxemia ([place]) 07/16/2014  . Tobacco abuse 06/27/2014  . Pulmonary edema   . Chronic atrial fibrillation ([place])   . Acute respiratory failure ([place]) 06/24/2014  . Edema 08/20/2011  . DM (diabetes mellitus), type 2 with renal complications ([place]) 36/14/4315  . Hyperlipidemia associated with type 2 diabetes mellitus ([place]) 03/10/2010  . Essential hypertension 03/10/2010  . MYOCARDIAL INFARCTION 03/10/2010  . CAD- last PCI 2011 03/10/2010  . PVD 03/10/2010  . GERD 03/10/2010    Past Surgical History:  Procedure Laterality Date  . COLONOSCOPY N/A 01/25/2016   Procedure: COLONOSCOPY;  Surgeon: Ronald Lobo, MD;  Location: [facility] ENDOSCOPY;  Service: Endoscopy;  Laterality: N/A;  . ESOPHAGOGASTRODUODENOSCOPY N/A 11/29/2015   Procedure: ESOPHAGOGASTRODUODENOSCOPY (EGD);  Surgeon: Wonda Horner, MD;  Location: [facility] ENDOSCOPY;  Service: Endoscopy;  Laterality: N/A;  . ESOPHAGOGASTRODUODENOSCOPY N/A 05/13/2016   Procedure: ESOPHAGOGASTRODUODENOSCOPY (EGD);  Surgeon:  Clarene Essex, MD;  Location: [facility] ENDOSCOPY;  Service: Endoscopy;  Laterality: N/A;  . ESOPHAGOGASTRODUODENOSCOPY (EGD) WITH PROPOFOL Left 12/25/2015   Procedure: ESOPHAGOGASTRODUODENOSCOPY (EGD) WITH PROPOFOL;  Surgeon: Arta Silence, MD;  Location: [facility] ENDOSCOPY;  Service: Endoscopy;  Laterality: Left;  . FEMORAL BYPASS  2003  . GIVENS CAPSULE STUDY N/A 01/26/2016    Procedure: GIVENS CAPSULE STUDY;  Surgeon: Ronald Lobo, MD;  Location: [facility] ENDOSCOPY;  Service: Endoscopy;  Laterality: N/A;  . HOT HEMOSTASIS N/A 05/13/2016   Procedure: HOT HEMOSTASIS (ARGON PLASMA COAGULATION/BICAP);  Surgeon: Clarene Essex, MD;  Location: [facility] ENDOSCOPY;  Service: Endoscopy;  Laterality: N/A;   Home Medications    Prior to Admission medications   Medication Sig Start Date End Date Taking? Authorizing Provider  acetaminophen (TYLENOL) 325 MG tablet Take 650 mg by mouth every 8 (eight) hours as needed for mild pain or fever (pain).     Historical Provider, MD  albuterol (PROVENTIL HFA;VENTOLIN HFA) 108 (90 BASE) MCG/ACT inhaler Inhale 2 puffs into the lungs every 6 (six) hours as needed for shortness of breath.    Historical Provider, MD  ammonium lactate (LAC-HYDRIN) 12 % lotion Apply 1 application topically daily.    Historical Provider, MD  apixaban (ELIQUIS) 2.5 MG TABS tablet Take 2.5 mg by mouth every 12 (twelve) hours.    Historical Provider, MD  atorvastatin (LIPITOR) 40 MG tablet Take 1 tablet (40 mg total) by mouth daily. Patient taking differently: Take 20 mg by mouth at bedtime.  08/01/14   Sherren Mocha, MD  cilostazol (PLETAL) 50 MG tablet Take 50 mg by mouth every 12 (twelve) hours.     Historical Provider, MD  ciprofloxacin (CIPRO) 250 MG tablet Take 1 tablet (250 mg total) by mouth 2 (two) times daily. 07/30/16   Patrecia Pour, MD  dextrose (GLUTOSE) 40 % GEL Take 1 Tube by mouth daily as needed for low blood sugar.     Historical Provider, MD  divalproex (DEPAKOTE) 250 MG DR tablet Take 1 tablet (250 mg total) by mouth every 8 (eight) hours. 07/25/16   Maryann Mikhail, DO  feeding supplement, GLUCERNA SHAKE, (GLUCERNA SHAKE) LIQD Take 237 mLs by mouth 3 (three) times daily between meals.    Historical Provider, MD  finasteride (PROSCAR) 5 MG tablet Take 5 mg by mouth daily.      Historical Provider, MD  folic acid (FOLVITE) 1 MG tablet Take 1 tablet (1 mg total) by  mouth daily. 07/25/16   Maryann Mikhail, DO  furosemide (LASIX) 20 MG tablet Use '80mg'$  (4 tablets) every morning and '40mg'$  (2 tablets) every evening. 07/25/16   Maryann Mikhail, DO  gabapentin (NEURONTIN) 300 MG capsule Take 300 mg by mouth 2 (two) times daily.     Historical Provider, MD  guaifenesin (HUMIBID E) 400 MG TABS tablet Take 400 mg by mouth 3 (three) times daily as needed. For congestion    Historical Provider, MD  hydrALAZINE (APRESOLINE) 25 MG tablet Take 0.5 tablets (12.5 mg total) by mouth every 8 (eight) hours. 07/25/16   Maryann Mikhail, DO  insulin glargine (LANTUS) 100 UNIT/ML injection Inject 40 Units into the skin daily.    Historical Provider, MD  insulin regular (NOVOLIN R,HUMULIN R) 100 units/mL injection Inject 5 Units into the skin See admin instructions. Inject 5 units under the skin before breakfast and inject 5 units before evening meal    Historical Provider, MD  isosorbide mononitrate (IMDUR) 30 MG 24 hr tablet Take 0.5 tablets (15 mg total) by  mouth daily. 07/25/16   Maryann Mikhail, DO  metoprolol (LOPRESSOR) 50 MG tablet Take 50 mg by mouth 2 (two) times daily.    Historical Provider, MD  Multiple Vitamins-Minerals (MULTIVITAMIN WITH MINERALS) tablet Take 1 tablet by mouth daily.    Historical Provider, MD  nystatin (MYCOSTATIN) 100000 UNIT/ML suspension Take 5 mLs (500,000 Units total) by mouth 4 (four) times daily. Patient not taking: Reported on 07/22/2016 11/29/15   Alexa Angela Burke, MD  pantoprazole (PROTONIX) 40 MG tablet Take 1 tablet (40 mg total) by mouth daily. 11/29/15   Florinda Marker, MD  simethicone (MYLICON) 80 MG chewable tablet Chew 80 mg by mouth 3 (three) times daily as needed for flatulence. Take with meals as needed    Historical Provider, MD  Tamsulosin HCl (FLOMAX) 0.4 MG CAPS Take 0.4 mg by mouth daily.      Historical Provider, MD  thiamine 100 MG tablet Take 1 tablet (100 mg total) by mouth daily. 07/25/16   Cristal Ford, DO    Family  History Family History  Problem Relation Age of Onset  . Cancer Father   . Cancer Mother   . Cancer Sister     Social History Social History  Substance Use Topics  . Smoking status: Current Every Day Smoker    Packs/day: 0.50    Years: 50.00    Types: Cigarettes  . Smokeless tobacco: Never Used     Comment: He has a 50-pack-year hx of tobacco abuse. Currently, smoking about half a pack a day.  . Alcohol use Yes     Comment: Previously drank heavily, and now has a drink every other day or so.     Allergies   Penicillins   Review of Systems Review of Systems As above  Physical Exam Triage Vital Signs ED Triage Vitals  Enc Vitals Group     BP 07/30/16 1445 94/56     Pulse Rate 07/30/16 1445 93     Resp 07/30/16 1445 18     Temp 07/30/16 1445 98.4 F (36.9 C)     Temp Source 07/30/16 1445 Oral     SpO2 07/30/16 1445 97 %     Weight --      Height --      Head Circumference --      Peak Flow --      Pain Score 07/30/16 1426 0     Pain Loc --      Pain Edu? --      Excl. in [place]? --    No data found.  Updated Vital Signs BP 95/77 (BP Location: Left Arm)   Pulse 100   Temp 98.4 F (36.9 C) (Oral)   Resp 18   SpO2 97%   Physical Exam  Constitutional: He is oriented to person, place, and time. He appears well-developed and well-nourished. No distress.  Eyes: EOM are normal. Pupils are equal, round, and reactive to light. No scleral icterus.  Neck: Normal range of motion. Neck supple. No JVD present.  Cardiovascular: Intact distal pulses.   No murmur heard. Distant irreg irreg heart sounds without murmur or gallop. Palpable radial and DP pulses. 2+ bilateral LE edema. no JVD at this time.   Pulmonary/Chest: Effort normal. No respiratory distress.  distant breath sounds without wheezes or rales  Abdominal: Soft. Bowel sounds are normal. He exhibits no distension. There is no tenderness.  no suprapubic tenderness  Musculoskeletal: Normal range of motion. He  exhibits no edema or tenderness.  Lymphadenopathy:    He has no cervical adenopathy.  Neurological: He is alert and oriented to person, place, and time. He exhibits normal muscle tone.  Skin: Skin is warm and dry. Capillary refill takes less than 2 seconds.  Vitals reviewed.  UC Treatments / Results  Labs (all labs ordered are listed, but only abnormal results are displayed) Labs Reviewed  URINE CULTURE    EKG  EKG Interpretation None      Radiology No results found.  Procedures Procedures (including critical care time)  Medications Ordered in UC Medications - No data to display   Initial Impression / Assessment and Plan / UC Course  I have reviewed the triage vital signs and the nursing notes.  Pertinent labs & imaging results that were available during my care of the patient were reviewed by me and considered in my medical decision making (see chart for details).   Final Clinical Impressions(s) / UC Diagnoses   Final diagnoses:  UTI (lower urinary tract infection)   Evidence of large leukocytes, negative nitrites on urine dipstick. Will send to culture. PCN allergy noted, so will Rx ciprofloxacin (E. coli with 85% susceptibility vs. 80% for TMP/SMZ) at reduced dose due to renal impairment. Low BP confirmed on second check without mental status changes, with rate-controlled AFib, no fever or symptoms of systemic illness. Has a history of lower BP's in the past, so doubt sepsis. Stringent return precautions are reviewed here if any symptoms worsen.   In absence of indwelling foley, and presence LUTS, may consider renal/bladder U/S to r/o obstruction.   New Prescriptions New Prescriptions   CIPROFLOXACIN (CIPRO) 250 MG TABLET    Take 1 tablet (250 mg total) by mouth 2 (two) times daily.     Patrecia Pour, MD 07/30/16 [phone]

## 2016-07-30 NOTE — Discharge Instructions (Signed)
Take ciprofloxacin twice daily for the urinary tract infection. We will send your urine to a culture and call you with the results in the next 48 - 72 hours.   If you start to feel ill, with fever, abdominal pain, weakness, fatigue, nausea, vomiting, or otherwise feel worse, you should be evaluated by a medical professional promptly.

## 2016-07-30 NOTE — Telephone Encounter (Signed)
Called cipro script for patient.   Ciprofloxacin '250mg'$  take one tablet by mouth 2 times each day.  Quantity 14, no refills.  Written by dr Thurmond Butts grunz/klh

## 2016-07-30 NOTE — ED Notes (Signed)
Escorted to bathroom

## 2016-07-31 LAB — URINE CULTURE

## 2016-08-12 ENCOUNTER — Encounter (HOSPITAL_COMMUNITY): Payer: Self-pay

## 2016-08-12 ENCOUNTER — Ambulatory Visit (HOSPITAL_COMMUNITY)
Admit: 2016-08-12 | Discharge: 2016-08-12 | Disposition: A | Discharge status: Home / Self Care | Payer: Medicare Other | Source: Ambulatory Visit | Attending: Cardiology | Admitting: Cardiology

## 2016-08-12 VITALS — BP 126/58 | HR 82 | Wt 187.2 lb

## 2016-08-12 DIAGNOSIS — Z7901 Long term (current) use of anticoagulants: Secondary | ICD-10-CM | POA: Insufficient documentation

## 2016-08-12 DIAGNOSIS — F17210 Nicotine dependence, cigarettes, uncomplicated: Secondary | ICD-10-CM | POA: Diagnosis not present

## 2016-08-12 DIAGNOSIS — Z794 Long term (current) use of insulin: Secondary | ICD-10-CM | POA: Insufficient documentation

## 2016-08-12 DIAGNOSIS — N183 Chronic kidney disease, stage 3 unspecified: Secondary | ICD-10-CM

## 2016-08-12 DIAGNOSIS — I5022 Chronic systolic (congestive) heart failure: Secondary | ICD-10-CM | POA: Diagnosis not present

## 2016-08-12 DIAGNOSIS — I4891 Unspecified atrial fibrillation: Secondary | ICD-10-CM | POA: Diagnosis not present

## 2016-08-12 DIAGNOSIS — I482 Chronic atrial fibrillation: Secondary | ICD-10-CM | POA: Insufficient documentation

## 2016-08-12 DIAGNOSIS — Z8546 Personal history of malignant neoplasm of prostate: Secondary | ICD-10-CM | POA: Insufficient documentation

## 2016-08-12 DIAGNOSIS — I5043 Acute on chronic combined systolic (congestive) and diastolic (congestive) heart failure: Secondary | ICD-10-CM | POA: Diagnosis not present

## 2016-08-12 DIAGNOSIS — I130 Hypertensive heart and chronic kidney disease with heart failure and stage 1 through stage 4 chronic kidney disease, or unspecified chronic kidney disease: Secondary | ICD-10-CM | POA: Insufficient documentation

## 2016-08-12 DIAGNOSIS — I739 Peripheral vascular disease, unspecified: Secondary | ICD-10-CM | POA: Diagnosis not present

## 2016-08-12 DIAGNOSIS — I2510 Atherosclerotic heart disease of native coronary artery without angina pectoris: Secondary | ICD-10-CM | POA: Insufficient documentation

## 2016-08-12 DIAGNOSIS — Z8673 Personal history of transient ischemic attack (TIA), and cerebral infarction without residual deficits: Secondary | ICD-10-CM | POA: Insufficient documentation

## 2016-08-12 DIAGNOSIS — E1122 Type 2 diabetes mellitus with diabetic chronic kidney disease: Secondary | ICD-10-CM | POA: Insufficient documentation

## 2016-08-12 DIAGNOSIS — Z79899 Other long term (current) drug therapy: Secondary | ICD-10-CM | POA: Diagnosis not present

## 2016-08-12 DIAGNOSIS — F1010 Alcohol abuse, uncomplicated: Secondary | ICD-10-CM | POA: Insufficient documentation

## 2016-08-12 LAB — BASIC METABOLIC PANEL
ANION GAP: 8 (ref 5–15)
BUN: 52 mg/dL — AB (ref 6–20)
CALCIUM: 9.6 mg/dL (ref 8.9–10.3)
CO2: 32 mmol/L (ref 22–32)
CREATININE: 2.25 mg/dL — AB (ref 0.61–1.24)
Chloride: 100 mmol/L — ABNORMAL LOW (ref 101–111)
GFR calc Af Amer: 31 mL/min — ABNORMAL LOW (ref >60)
GFR, EST NON AFRICAN AMERICAN: 26 mL/min — AB (ref >60)
GLUCOSE: 151 mg/dL — AB (ref 65–99)
Potassium: 4.2 mmol/L (ref 3.5–5.1)
Sodium: 140 mmol/L (ref 135–145)

## 2016-08-12 LAB — BRAIN NATRIURETIC PEPTIDE: B Natriuretic Peptide: 547 pg/mL — ABNORMAL HIGH (ref 0.0–100.0)

## 2016-08-12 MED ORDER — ISOSORBIDE MONONITRATE ER 30 MG PO TB24: 30.0000 mg | ORAL_TABLET | Freq: Every day | ORAL | 3 refills | Status: DC

## 2016-08-12 MED ORDER — HYDRALAZINE HCL 25 MG PO TABS: 25.0000 mg | ORAL_TABLET | Freq: Three times a day (TID) | ORAL | 3 refills | Status: DC

## 2016-08-12 MED ORDER — METOPROLOL SUCCINATE ER 50 MG PO TB24: ORAL_TABLET | ORAL | 3 refills | Status: DC

## 2016-08-12 NOTE — Patient Instructions (Signed)
Stop Cilostazol  Stop Metoprolol Tartrate  Start Metoprolol Succinate 75 mg (1 & 1/2) daily  Increase Hydralazine 25 mg (1 tab) Three times a day   Increase Isosorbide (Imdur) 30 mg (1 tab) daily  Labs today  Labs in 1 week  Your physician has requested that you have a lexiscan myoview. For further information please visit [email or web address]. Please follow instruction sheet, as given.  Your physician recommends that you schedule a follow-up appointment in: 3-4 weeks

## 2016-08-13 ENCOUNTER — Telehealth (HOSPITAL_COMMUNITY): Payer: Self-pay

## 2016-08-13 NOTE — Telephone Encounter (Signed)
Amy RN with [facility] called to report patient who was seen in CHF clinic yesterday smokes, drinks, misses his lasix on average every other day, and has a salt shaker on his night stand. RN endorses she has continued to go over education and importance of diet/fluid/med compliance with daily weights in length, but patient continues to be noncompliant. Amy just wanted to [facility] CHF team, will forward to Dr. Aundra Dubin who saw patient in CHF clinic yesterday.  Renee Pain, RN

## 2016-08-13 NOTE — Progress Notes (Signed)
PCP: Dr. Koleen Nimrod Cardiology: Dr. Aundra Dubin  80 yo with history of HTN, CKD stage III, type II diabetes, prior CVA, CKD stage III, CAD, ischemic cardiomyopathy, PAD, chronic atrial fibrillation, and ETOH abuse presents for cardiology evaluation.  He had a LCx stent in 2011 and has a known occluded RCA.  He has been in chronic atrial fibrillation.  Prior history of GI bleeding with ablation of small bowel AVMs in 5/17. Most recent echo in 5/17 with EF 25-30%.  He drinks heavily and smokes about 1/2 ppd. He lives in an independent living facility.  He was admitted in 7/17 with acute on chronic systolic CHF and diuresed.    He returns for post-hospital followup.  He reports increased lower leg edema which he attributes to drinking a lot of fluid due to recent UTI.  He is not very active.  Walks with a cane or walker.  No dyspnea walking around his house.  Short of breath with steps/inclines.  No orthopnea/PND.  No chest pain.  No dizziness/syncope/palpitations.  No recent BRBPR or melena.    Labs (7/17): K 4.2, creatinine 2.62  ECG (7/17): atrial fibrillation, RBBB  PMH: 1. HTN 2. Type II diabetes 3. H/o CVA 4. H/o GI bleeding: Small bowel AVM ablation in 5/17.  5. Prostate cancer 6. Bipolar disorder 7. ETOH abuse 8. PAD 9. CAD: DES to LCx in 2011, RCA noted to be chronically totally occluded.  10. Chronic systolic CHF: Suspect ischemic cardiomyopathy but may have contribution from ETOH abuse.   - Echo (8/15): EF 40-45%, mildly dilated RV. - Echo (5/17): EF 25-30%, normal RV size and systolic function.  11. CKD: Stage III.  12. Chronic atrial fibrillation  SH: Lives alone in an independent living facility.  1/2 gallon liquor/week.  Active smoker.    Family History  Problem Relation Age of Onset  . Cancer Father   . Cancer Mother   . Cancer Sister    ROS: All systems reviewed and negative except as per HPI.   Current Outpatient Prescriptions  Medication Sig Dispense Refill  .  acetaminophen (TYLENOL) 325 MG tablet Take 650 mg by mouth every 8 (eight) hours as needed for mild pain or fever (pain).     Marland Kitchen albuterol (PROVENTIL HFA;VENTOLIN HFA) 108 (90 BASE) MCG/ACT inhaler Inhale 2 puffs into the lungs every 6 (six) hours as needed for shortness of breath.    Marland Kitchen ammonium lactate (LAC-HYDRIN) 12 % lotion Apply 1 application topically daily.    Marland Kitchen apixaban (ELIQUIS) 2.5 MG TABS tablet Take 2.5 mg by mouth every 12 (twelve) hours.    Marland Kitchen atorvastatin (LIPITOR) 40 MG tablet Take 1 tablet (40 mg total) by mouth daily. 90 tablet 3  . dextrose (GLUTOSE) 40 % GEL Take 1 Tube by mouth daily as needed for low blood sugar.     . divalproex (DEPAKOTE) 250 MG DR tablet Take 1 tablet (250 mg total) by mouth every 8 (eight) hours. 90 tablet 0  . feeding supplement, GLUCERNA SHAKE, (GLUCERNA SHAKE) LIQD Take 237 mLs by mouth 3 (three) times daily between meals.    . finasteride (PROSCAR) 5 MG tablet Take 5 mg by mouth daily.      . folic acid (FOLVITE) 1 MG tablet Take 1 tablet (1 mg total) by mouth daily. 30 tablet 0  . furosemide (LASIX) 20 MG tablet Use '80mg'$  (4 tablets) every morning and '40mg'$  (2 tablets) every evening. 180 tablet 0  . gabapentin (NEURONTIN) 300 MG capsule Take 300 mg  by mouth 2 (two) times daily.     Marland Kitchen guaifenesin (HUMIBID E) 400 MG TABS tablet Take 400 mg by mouth 3 (three) times daily as needed. For congestion    . hydrALAZINE (APRESOLINE) 25 MG tablet Take 1 tablet (25 mg total) by mouth 3 (three) times daily. 90 tablet 3  . insulin glargine (LANTUS) 100 UNIT/ML injection Inject 40 Units into the skin daily.    . insulin regular (NOVOLIN R,HUMULIN R) 100 units/mL injection Inject 5 Units into the skin See admin instructions. Inject 5 units under the skin before breakfast and inject 5 units before evening meal    . isosorbide mononitrate (IMDUR) 30 MG 24 hr tablet Take 1 tablet (30 mg total) by mouth daily. 30 tablet 3  . Multiple Vitamins-Minerals (MULTIVITAMIN WITH  MINERALS) tablet Take 1 tablet by mouth daily.    Marland Kitchen nystatin (MYCOSTATIN) 100000 UNIT/ML suspension Take 5 mLs (500,000 Units total) by mouth 4 (four) times daily. 240 mL 0  . pantoprazole (PROTONIX) 40 MG tablet Take 1 tablet (40 mg total) by mouth daily. 30 tablet 11  . simethicone (MYLICON) 80 MG chewable tablet Chew 80 mg by mouth 3 (three) times daily as needed for flatulence. Take with meals as needed    . Tamsulosin HCl (FLOMAX) 0.4 MG CAPS Take 0.4 mg by mouth daily.      Marland Kitchen thiamine 100 MG tablet Take 1 tablet (100 mg total) by mouth daily. 30 tablet 0  . metoprolol succinate (TOPROL-XL) 50 MG 24 hr tablet Take 1 & 1/2 tabs daily,Take with or immediately following a meal. 45 tablet 3   No current facility-administered medications for this encounter.    BP (!) 126/58   Pulse 82   Wt 187 lb 4 oz (84.9 kg)   SpO2 97%   BMI 25.40 kg/m  General: NAD Neck:  JVP 8-9 cm, no thyromegaly or thyroid nodule.  Lungs: Clear to auscultation bilaterally with normal respiratory effort. CV: Nondisplaced PMI.  Heart regular S1/S2, no S3/S4, no murmur.  2+ edema 1/2 to knees bilaterally.  No carotid bruit.  Normal pedal pulses.  Abdomen: Soft, nontender, no hepatosplenomegaly, no distention.  Skin: Intact without lesions or rashes.  Neurologic: Alert and oriented x 3.  Psych: Normal affect. Extremities: No clubbing or cyanosis.  HEENT: Normal.   Assessment/Plan: 1. CAD: No chest pain.  He is not on ASA given Eliquis use.  He is on atorvastatin.  Will need to check lipids at next appointment.  2. Chronic systolic CHF: EF 56-38% on 5/17 echo, which is down from 40-45% in 8/15. Suspect ischemic cardiomyopathy with possible contribution from ETOH.  He continues to drink.  On exam, he is mildly volume overloaded.  NYHA class II-III symptoms but not very active.  - Increase Lasix to 80 mg bid x 4 days, then 80 qam/60 qpm. BMET/BNP today and repeat in 1 week.   - Increase hydralazine to 25 mg tid and  Imdur to 30 daily.  - Stop metoprolol tartrate, start Toprol XL 75 mg daily.  - Hold off on ACEI/ARB/ARNI and spironolactone with elevated creatinine. - Fall in EF on most recent echo but no chest pain.  No cath planned given elevated creatinine.  I will arrange for Lexiscan Cardiolite to assess for ischemia.  Would only consider cath if there is a large area of ischemia.  - He needs to quit drinking ETOH.  3. PAD: Really does not describe claudication.  Given CHF, he should stop cilostazol.  4. CKD: Stage III.  BMET today and again in 2 weeks on higher Lasix dose.  5. ETOH abuse: Strongly encouraged to cut back/quit. 6. Smoking: Strongly encouraged to cut back/quit.  7. Atrial fibrillation: Chronic.  He is on apixaban, will continue.   Followup in 3 wks.   Loralie Champagne 08/13/2016

## 2016-08-18 ENCOUNTER — Telehealth (HOSPITAL_COMMUNITY): Payer: Self-pay | Admitting: Cardiology

## 2016-08-18 NOTE — Telephone Encounter (Signed)
[facility] [place] nurse called with concerns regarding decreased b/p after recent medication changes. (hydalazine 25 TID, toprol 75 daily, imdur 30 daily)  Patient was c/o increased weakness and dizziness b/p 96/50. [place] nurse advised to decrease imdur and hydralazine back to previous dose (imdur 1/2 tab and hydralazine 1/2 dose )patient did feel better (decreased dizziness , patient reports he no longer felt as if he could pass out) b/p 110/60   Please advise further if needed.  Amy RN [facility] aware and voiced understanding Patient scheduled for visit today, will access and get vitals, will return call if any further concerns, patient is due for bmet today as well fyi

## 2016-08-19 NOTE — Telephone Encounter (Signed)
Leave hydralazine/Imdur at lower doses for now.

## 2016-08-24 ENCOUNTER — Other Ambulatory Visit (HOSPITAL_COMMUNITY): Payer: Self-pay

## 2016-09-09 ENCOUNTER — Ambulatory Visit (HOSPITAL_COMMUNITY)
Admission: RE | Admit: 2016-09-09 | Discharge: 2016-09-09 | Disposition: A | Discharge status: Home / Self Care | Payer: Medicare Other | Source: Ambulatory Visit | Attending: Cardiology | Admitting: Cardiology

## 2016-09-09 VITALS — BP 92/56 | HR 93 | Wt 183.0 lb

## 2016-09-09 DIAGNOSIS — I2510 Atherosclerotic heart disease of native coronary artery without angina pectoris: Secondary | ICD-10-CM | POA: Diagnosis not present

## 2016-09-09 DIAGNOSIS — E1122 Type 2 diabetes mellitus with diabetic chronic kidney disease: Secondary | ICD-10-CM | POA: Insufficient documentation

## 2016-09-09 DIAGNOSIS — Z79899 Other long term (current) drug therapy: Secondary | ICD-10-CM | POA: Insufficient documentation

## 2016-09-09 DIAGNOSIS — E1151 Type 2 diabetes mellitus with diabetic peripheral angiopathy without gangrene: Secondary | ICD-10-CM | POA: Diagnosis not present

## 2016-09-09 DIAGNOSIS — I5022 Chronic systolic (congestive) heart failure: Secondary | ICD-10-CM

## 2016-09-09 DIAGNOSIS — N183 Chronic kidney disease, stage 3 unspecified: Secondary | ICD-10-CM

## 2016-09-09 DIAGNOSIS — I482 Chronic atrial fibrillation: Secondary | ICD-10-CM | POA: Insufficient documentation

## 2016-09-09 DIAGNOSIS — F1010 Alcohol abuse, uncomplicated: Secondary | ICD-10-CM | POA: Diagnosis not present

## 2016-09-09 DIAGNOSIS — I4891 Unspecified atrial fibrillation: Secondary | ICD-10-CM

## 2016-09-09 DIAGNOSIS — Z794 Long term (current) use of insulin: Secondary | ICD-10-CM | POA: Diagnosis not present

## 2016-09-09 DIAGNOSIS — Z7901 Long term (current) use of anticoagulants: Secondary | ICD-10-CM | POA: Diagnosis not present

## 2016-09-09 DIAGNOSIS — I130 Hypertensive heart and chronic kidney disease with heart failure and stage 1 through stage 4 chronic kidney disease, or unspecified chronic kidney disease: Secondary | ICD-10-CM | POA: Diagnosis not present

## 2016-09-09 DIAGNOSIS — F17200 Nicotine dependence, unspecified, uncomplicated: Secondary | ICD-10-CM | POA: Diagnosis not present

## 2016-09-09 MED ORDER — FUROSEMIDE 20 MG PO TABS: 40.0000 mg | ORAL_TABLET | Freq: Every day | ORAL | 0 refills | Status: DC

## 2016-09-09 MED ORDER — METOPROLOL SUCCINATE ER 50 MG PO TB24: 50.0000 mg | ORAL_TABLET | Freq: Every day | ORAL | 3 refills | Status: AC

## 2016-09-09 NOTE — Patient Instructions (Signed)
HOLD ALL MEDS TODAY  DECREASE Lasix to 40 mg (2 tabs) daily DECREASE Toprol to 50 mg, one tab daily  Your physician recommends that you schedule a follow-up appointment in: 2 weeks with Dr Aundra Dubin  Do the following things EVERYDAY: 1) Weigh yourself in the morning before breakfast. Write it down and keep it in a log. 2) Take your medicines as prescribed 3) Eat low salt foods-Limit salt (sodium) to 2000 mg per day.  4) Stay as active as you can everyday 5) Limit all fluids for the day to less than 2 liters

## 2016-09-09 NOTE — Progress Notes (Signed)
Paramedicine Multidisciplinary Team Update  ACETON KINNEAR is enrolled in the [employer] Paramedicine Program through [facility].  The patient presents today in association with an [facility] Appointment.  Patient living/home environment and social support-- Patient reports he is lives alone in an apartment in senior housing at [place]-- Patient reports he has a pill box and gets his own medications from the pharmacy.  Does the patient have a scale and weigh each day? Yes, "I understand the need to weigh daily now"  Does the patient follow a low salt diet?"the best that I can" Is patient compliant with medications?  Yes  Does the patient have transportation for physician appointments? Family assists with transport. CSW shared information on medicaid transportation services  Does the patient contact the [facility] Clinic appropriately with worsening symptoms or weight increases? yes  Do you have an Advanced Directive? Discussed at length with patient and daughter. Patient will explore and return to discuss further with CSW when ready.  Are there any identified obstacles / challenges for adherence to current treatment plan?  Patient reports he has Medicare and Medicaid. Patient has [facility] 7x days a week for support, personal care and light housekeeping. Patient's daughter very involved and supportive. CSW provided card for future need and will be available when patient ready to complete Advanced Directives.  Raquel Sarna, LCSW [phone]

## 2016-09-09 NOTE — Progress Notes (Signed)
Advanced Heart Failure Medication Review by a Pharmacist  Does the patient  feel that his/her medications are working for him/her?  yes  Has the patient been experiencing any side effects to the medications prescribed?  no  Does the patient measure his/her own blood pressure or blood glucose at home?  no   Does the patient have any problems obtaining medications due to transportation or finances?   no  Understanding of regimen: poor Understanding of indications: poor Potential of compliance: good Patient understands to avoid NSAIDs. Patient understands to avoid decongestants.  Issues to address at subsequent visits: None   Pharmacist comments:  [name] is a pleasant 80 yo M presenting with [name] (paramedicine) and without a medication list. He has poor understanding of his medication regimen but has had a HH nurse come out weekly to fill a pillbox for him. He gets most of his medications at the [facility] in [place]. I have asked him to bring his medication bottles with him to each of his clinic visit appointment from now on and starting next week [name] will be filling his pillbox.   Ruta Hinds. Velva Harman, PharmD, BCPS, CPP Clinical Pharmacist Pager: [phone] Phone: [phone] 09/09/2016 11:16 AM      Time with patient: 10 minutes Preparation and documentation time: 2 minutes Total time: 12 minutes

## 2016-09-09 NOTE — Progress Notes (Signed)
PCP: Dr. Koleen Nimrod Cardiology: Dr. Aundra Dubin  80 yo with history of HTN, CKD stage III, type II diabetes, prior CVA, CKD stage III, CAD, ischemic cardiomyopathy, PAD, chronic atrial fibrillation, and ETOH abuse presents for cardiology evaluation.  He had a LCx stent in 2011 and has a known occluded RCA.  He has been in chronic atrial fibrillation.  Prior history of GI bleeding with ablation of small bowel AVMs in 5/17. Most recent echo in 5/17 with EF 25-30%.  He drinks heavily and smokes about 1/2 ppd. He lives in an independent living facility.  He was admitted in 7/17 with acute on chronic systolic CHF and diuresed.    He returns for HF follow up. Last visit hydralazine and imdur added but later had to be cut back to half a dose. Overall feeling ok but complaining of mild fatigue. Denies dizziness. . Mild dyspnea with exertion. Weight at home 180 pounds. Has not had medications today. Smoking 1PPD. Drinng 4 ounces of bourbon a day. [facility] following for medications. No recent BRBPR or melena.  Lives alone. Folllowed by [employer].   Labs (7/17): K 4.2, creatinine 2.62 Labs (08/12/2016) K 4.2 Creatinine 2.25.   ECG (7/17): atrial fibrillation, RBBB  PMH: 1. HTN 2. Type II diabetes 3. H/o CVA 4. H/o GI bleeding: Small bowel AVM ablation in 5/17.  5. Prostate cancer 6. Bipolar disorder 7. ETOH abuse 8. PAD 9. CAD: DES to LCx in 2011, RCA noted to be chronically totally occluded.  10. Chronic systolic CHF: Suspect ischemic cardiomyopathy but may have contribution from ETOH abuse.   - Echo (8/15): EF 40-45%, mildly dilated RV. - Echo (5/17): EF 25-30%, normal RV size and systolic function.  11. CKD: Stage III.  12. Chronic atrial fibrillation  SH: Lives alone in an independent living facility.  1/2 gallon liquor/week.  Active smoker.    Family History  Problem Relation Age of Onset  . Cancer Father   . Cancer Mother   . Cancer Sister    ROS: All systems reviewed and negative except as  per HPI.   Current Outpatient Prescriptions  Medication Sig Dispense Refill  . acetaminophen (TYLENOL) 325 MG tablet Take 650 mg by mouth every 8 (eight) hours as needed for mild pain or fever (pain).     Marland Kitchen albuterol (PROVENTIL HFA;VENTOLIN HFA) 108 (90 BASE) MCG/ACT inhaler Inhale 2 puffs into the lungs every 6 (six) hours as needed for shortness of breath.    Marland Kitchen ammonium lactate (LAC-HYDRIN) 12 % lotion Apply 1 application topically daily.    Marland Kitchen apixaban (ELIQUIS) 2.5 MG TABS tablet Take 2.5 mg by mouth every 12 (twelve) hours.    Marland Kitchen atorvastatin (LIPITOR) 40 MG tablet Take 1 tablet (40 mg total) by mouth daily. 90 tablet 3  . dextrose (GLUTOSE) 40 % GEL Take 1 Tube by mouth daily as needed for low blood sugar.     . feeding supplement, GLUCERNA SHAKE, (GLUCERNA SHAKE) LIQD Take 237 mLs by mouth 3 (three) times daily between meals.    . finasteride (PROSCAR) 5 MG tablet Take 5 mg by mouth daily.      . folic acid (FOLVITE) 1 MG tablet Take 1 tablet (1 mg total) by mouth daily. 30 tablet 0  . furosemide (LASIX) 20 MG tablet Use '80mg'$  (4 tablets) every morning and '40mg'$  (2 tablets) every evening. 180 tablet 0  . gabapentin (NEURONTIN) 300 MG capsule Take 300 mg by mouth 2 (two) times daily.     Marland Kitchen  guaifenesin (HUMIBID E) 400 MG TABS tablet Take 400 mg by mouth 3 (three) times daily as needed. For congestion    . hydrALAZINE (APRESOLINE) 25 MG tablet Take 12.5 mg by mouth 3 (three) times daily.    . insulin glargine (LANTUS) 100 UNIT/ML injection Inject 40 Units into the skin daily.    . insulin regular (NOVOLIN R,HUMULIN R) 100 units/mL injection Inject 5 Units into the skin See admin instructions. Inject 5 units under the skin before breakfast and inject 5 units before evening meal    . isosorbide mononitrate (IMDUR) 30 MG 24 hr tablet Take 15 mg by mouth daily.    . metoprolol succinate (TOPROL-XL) 50 MG 24 hr tablet Take 1 & 1/2 tabs daily,Take with or immediately following a meal. 45 tablet 3  .  Multiple Vitamins-Minerals (MULTIVITAMIN WITH MINERALS) tablet Take 1 tablet by mouth daily.    Marland Kitchen nystatin (MYCOSTATIN) 100000 UNIT/ML suspension Take 5 mLs (500,000 Units total) by mouth 4 (four) times daily. 240 mL 0  . pantoprazole (PROTONIX) 40 MG tablet Take 1 tablet (40 mg total) by mouth daily. 30 tablet 11  . simethicone (MYLICON) 80 MG chewable tablet Chew 80 mg by mouth 3 (three) times daily as needed for flatulence. Take with meals as needed    . Tamsulosin HCl (FLOMAX) 0.4 MG CAPS Take 0.4 mg by mouth daily.      Marland Kitchen thiamine 100 MG tablet Take 1 tablet (100 mg total) by mouth daily. 30 tablet 0  . divalproex (DEPAKOTE) 250 MG DR tablet Take 1 tablet (250 mg total) by mouth every 8 (eight) hours. (Patient not taking: Reported on 09/09/2016) 90 tablet 0   No current facility-administered medications for this encounter.    BP (!) 92/56 (BP Location: Left Arm, Patient Position: Sitting, Cuff Size: Normal)   Pulse 93   Wt 183 lb (83 kg)   SpO2 93%   BMI 24.82 kg/m  General: NAD Neck:  JVP 5-6 cm, no thyromegaly or thyroid nodule.  Lungs: Clear to auscultation bilaterally with normal respiratory effort. CV: Nondisplaced PMI.  Heart regular S1/S2, no S3/S4, no murmur.  No edema.  No carotid bruit.  Normal pedal pulses.  Abdomen: Soft, nontender, no hepatosplenomegaly, no distention.  Skin: Intact without lesions or rashes.  Neurologic: Alert and oriented x 3.  Psych: Normal affect. Extremities: No clubbing or cyanosis.  HEENT: Normal.   Assessment/Plan: 1. CAD: No chest pain.  He is not on ASA given Eliquis use.  He is on atorvastatin.  Will need to check lipids at next appointment.  2. Chronic systolic CHF: EF 03-50% on 5/17 echo, which is down from 40-45% in 8/15. Suspect ischemic cardiomyopathy with possible contribution from ETOH.  He continues to drink.  On exam, he does not appear overloaded. NYHA class II-III symptoms.   -  No sure about the lasix dose at home. Continue lasix  40 mg daily. I will stop the afternoon dose.  - Continue hyralazine 12.5 mg three times a day + 15 mg imdur daily. .  -Cut back Toprol XL  50 daily.  - Hold off on ACEI/ARB/ARNI and spironolactone with elevated creatinine. -.  No cath planned given elevated creatinine.  Lexiscan Cardiolite to assess for ischemia. He would like to hold off.  Would only consider cath if there is a large area of ischemia.  - He needs to quit drinking ETOH.  3. PAD: Really does not describe claudication.  Given CHF, he should stop cilostazol.  4. CKD: Stage III.  BMET today and again in 2 weeks on higher Lasix dose.  5. ETOH abuse: Strongly encouraged to cut back/quit. 6. Smoking: Strongly encouraged to cut back/quit.  7. Atrial fibrillation: Chronic.  He is on apixaban, will continue.   Follow up in 3 wks. Continue Para-medicine.   Emmamarie Kluender NP-C  09/09/2016

## 2016-09-10 ENCOUNTER — Telehealth (HOSPITAL_COMMUNITY): Payer: Self-pay | Admitting: *Deleted

## 2016-09-10 NOTE — Telephone Encounter (Signed)
Freda Munro called and stated Dr Cristina Gong wanted pt to have a cbc, iron panel and ferritin level done, dx: D50.0.  She states when she contacted pt to sch lab appt he told her transportation was an issue but his home health nurse was coming out on Mon and he would like her to do the labs.  Freda Munro ask if we could have [facility] do labs and send the result to them at fax # [phone].  She states if they are not able to do the labs for reason we could do them at his next appt with [place].  Message sent to [facility] to do labs Mon 9/18.

## 2016-09-14 ENCOUNTER — Other Ambulatory Visit (HOSPITAL_COMMUNITY): Payer: Self-pay | Admitting: Cardiology

## 2016-09-14 ENCOUNTER — Telehealth (HOSPITAL_COMMUNITY): Payer: Self-pay

## 2016-09-14 MED ORDER — FUROSEMIDE 40 MG PO TABS: 80.0000 mg | ORAL_TABLET | Freq: Every day | ORAL | 6 refills | Status: DC

## 2016-09-14 NOTE — Telephone Encounter (Signed)
Amty Angraces with [facility] called to report patient having weight gain and only taking 40 mg once daily of torsemide now. Per [facility] RN, patient was taking '80mg'$  in am and 40 mg in pm before his visit with our CHF clinic last week. Amy Clegg NP-C who last saw patient was told by patient he was doing 40 mg BID, and after examing the patient decreased torsemide by DCing pm dose. Advised per Amy to keep patient on 80 mg once daily, draw bmet today, and have patient call us with any further needs/changes. [facility] now signing off as this is the last visit per insurance coverage.  Katie with CHF paramedicine clinic to follow patient now.  Renee Pain, RN

## 2016-09-16 ENCOUNTER — Telehealth (HOSPITAL_COMMUNITY): Payer: Self-pay | Admitting: *Deleted

## 2016-09-16 NOTE — Telephone Encounter (Signed)
Received labs drawn by [facility] 9/18 with pt's ferritin and iron panel results, faxed labs to Dr Cristina Gong who had ordered these labs.

## 2016-09-21 ENCOUNTER — Other Ambulatory Visit (HOSPITAL_COMMUNITY): Payer: Self-pay | Admitting: Cardiology

## 2016-09-24 ENCOUNTER — Ambulatory Visit: Payer: Self-pay

## 2016-09-29 ENCOUNTER — Telehealth: Payer: Self-pay | Admitting: Licensed Clinical Social Worker

## 2016-09-29 NOTE — Telephone Encounter (Signed)
CSW contacted pt about [facility] program, pt agreeable. CSW then contacted [facility] and referred pt [facility] will follow up with pt. CSW will continue to coordinate services with paramedicine program. Raquel Sarna, LCSW [phone]

## 2016-10-02 ENCOUNTER — Telehealth (HOSPITAL_COMMUNITY): Payer: Self-pay | Admitting: *Deleted

## 2016-10-02 ENCOUNTER — Encounter (HOSPITAL_COMMUNITY): Payer: Self-pay

## 2016-10-02 ENCOUNTER — Ambulatory Visit (HOSPITAL_COMMUNITY)
Admission: RE | Admit: 2016-10-02 | Discharge: 2016-10-02 | Disposition: A | Discharge status: Home / Self Care | Payer: Medicare Other | Source: Ambulatory Visit | Attending: Cardiology | Admitting: Cardiology

## 2016-10-02 ENCOUNTER — Telehealth (HOSPITAL_COMMUNITY): Payer: Self-pay | Admitting: Vascular Surgery

## 2016-10-02 VITALS — BP 152/87 | HR 92 | Ht 72.0 in | Wt 193.1 lb

## 2016-10-02 DIAGNOSIS — F17200 Nicotine dependence, unspecified, uncomplicated: Secondary | ICD-10-CM | POA: Diagnosis not present

## 2016-10-02 DIAGNOSIS — Z8673 Personal history of transient ischemic attack (TIA), and cerebral infarction without residual deficits: Secondary | ICD-10-CM | POA: Diagnosis not present

## 2016-10-02 DIAGNOSIS — I5022 Chronic systolic (congestive) heart failure: Secondary | ICD-10-CM | POA: Diagnosis not present

## 2016-10-02 DIAGNOSIS — R0600 Dyspnea, unspecified: Secondary | ICD-10-CM

## 2016-10-02 DIAGNOSIS — F1010 Alcohol abuse, uncomplicated: Secondary | ICD-10-CM | POA: Insufficient documentation

## 2016-10-02 DIAGNOSIS — I739 Peripheral vascular disease, unspecified: Secondary | ICD-10-CM | POA: Diagnosis not present

## 2016-10-02 DIAGNOSIS — I5023 Acute on chronic systolic (congestive) heart failure: Secondary | ICD-10-CM | POA: Diagnosis not present

## 2016-10-02 DIAGNOSIS — Z794 Long term (current) use of insulin: Secondary | ICD-10-CM | POA: Insufficient documentation

## 2016-10-02 DIAGNOSIS — N183 Chronic kidney disease, stage 3 unspecified: Secondary | ICD-10-CM

## 2016-10-02 DIAGNOSIS — I482 Chronic atrial fibrillation: Secondary | ICD-10-CM | POA: Diagnosis not present

## 2016-10-02 DIAGNOSIS — I2510 Atherosclerotic heart disease of native coronary artery without angina pectoris: Secondary | ICD-10-CM | POA: Insufficient documentation

## 2016-10-02 DIAGNOSIS — E1122 Type 2 diabetes mellitus with diabetic chronic kidney disease: Secondary | ICD-10-CM | POA: Insufficient documentation

## 2016-10-02 DIAGNOSIS — E1169 Type 2 diabetes mellitus with other specified complication: Secondary | ICD-10-CM | POA: Diagnosis not present

## 2016-10-02 DIAGNOSIS — Z79899 Other long term (current) drug therapy: Secondary | ICD-10-CM | POA: Insufficient documentation

## 2016-10-02 DIAGNOSIS — E785 Hyperlipidemia, unspecified: Secondary | ICD-10-CM

## 2016-10-02 DIAGNOSIS — Z7901 Long term (current) use of anticoagulants: Secondary | ICD-10-CM | POA: Insufficient documentation

## 2016-10-02 DIAGNOSIS — I130 Hypertensive heart and chronic kidney disease with heart failure and stage 1 through stage 4 chronic kidney disease, or unspecified chronic kidney disease: Secondary | ICD-10-CM | POA: Insufficient documentation

## 2016-10-02 LAB — BASIC METABOLIC PANEL
Anion gap: 9 (ref 5–15)
BUN: 26 mg/dL — ABNORMAL HIGH (ref 6–20)
CHLORIDE: 101 mmol/L (ref 101–111)
CO2: 30 mmol/L (ref 22–32)
Calcium: 9 mg/dL (ref 8.9–10.3)
Creatinine, Ser: 1.57 mg/dL — ABNORMAL HIGH (ref 0.61–1.24)
GFR, EST AFRICAN AMERICAN: 47 mL/min — AB (ref >60)
GFR, EST NON AFRICAN AMERICAN: 41 mL/min — AB (ref >60)
Glucose, Bld: 206 mg/dL — ABNORMAL HIGH (ref 65–99)
POTASSIUM: 3.3 mmol/L — AB (ref 3.5–5.1)
SODIUM: 140 mmol/L (ref 135–145)

## 2016-10-02 LAB — LIPID PANEL
Cholesterol: 111 mg/dL (ref 0–200)
HDL: 50 mg/dL (ref >40)
LDL Cholesterol: 44 mg/dL (ref 0–99)
Total CHOL/HDL Ratio: 2.2 RATIO
Triglycerides: 87 mg/dL (ref <150)
VLDL: 17 mg/dL (ref 0–40)

## 2016-10-02 LAB — BRAIN NATRIURETIC PEPTIDE: B Natriuretic Peptide: 710.7 pg/mL — ABNORMAL HIGH (ref 0.0–100.0)

## 2016-10-02 MED ORDER — POTASSIUM CHLORIDE CRYS ER 20 MEQ PO TBCR: 20.0000 meq | EXTENDED_RELEASE_TABLET | Freq: Every day | ORAL | 3 refills | Status: DC

## 2016-10-02 MED ORDER — FUROSEMIDE 40 MG PO TABS: ORAL_TABLET | ORAL | 6 refills | Status: DC

## 2016-10-02 MED ORDER — SPIRONOLACTONE 25 MG PO TABS: 12.5000 mg | ORAL_TABLET | Freq: Every day | ORAL | 3 refills | Status: DC

## 2016-10-02 NOTE — Patient Instructions (Signed)
Increase Furosemide (Lasix) to 80 mg (2 tabs) in AM and 40 mg (1 tab) in PM  Start Spironolactone 12.5 mg (1/2 tab) daily  Labs today  Labs in 10 days  Your physician has requested that you have a lexiscan myoview. For further information please visit [email or web address]. Please follow instruction sheet, as given.  Your physician recommends that you schedule a follow-up appointment in: 3-4 weeks

## 2016-10-02 NOTE — Telephone Encounter (Signed)
Sent in basket message to [employer] to call pt to schedule stress test

## 2016-10-02 NOTE — Telephone Encounter (Signed)
Notes Recorded by Harvie Junior, Yarnell on 10/02/2016 at 4:44 PM EDT Patient aware. Already on lab schedule for 10/16  ------  Notes Recorded by Larey Dresser, MD on 10/02/2016 at 4:24 PM EDT Good lipids. Increased Lasix today so add KCl 20 daily. BMET 10 days.    Ref Range & Units 11:29 68moago 214mogo   Sodium 135 - 145 mmol/L 140  140  136    Potassium 3.5 - 5.1 mmol/L 3.3   4.2  4.2    Chloride 101 - 111 mmol/L 101  100   95     CO2 22 - 32 mmol/L 30  32  34     Glucose, Bld 65 - 99 mg/dL 206   151   261     BUN 6 - 20 mg/dL 26   52   63     Creatinine, Ser 0.61 - 1.24 mg/dL 1.57   2.25   2.62     Calcium 8.9 - 10.3 mg/dL 9.0  9.6  9.1    GFR calc non Af Amer >60 mL/min 41   26   22     GFR calc Af Amer >60 mL/min 47   31CM   25CM    Comments: (NOTE)

## 2016-10-03 NOTE — Progress Notes (Signed)
PCP: Dr. Koleen Nimrod Cardiology: Dr. Aundra Dubin  80 yo with history of HTN, CKD stage III, type II diabetes, prior CVA, CKD stage III, CAD, ischemic cardiomyopathy, PAD, chronic atrial fibrillation, and ETOH abuse presents for cardiology evaluation.  He had a LCx stent in 2011 and has a known occluded RCA.  He has been in chronic atrial fibrillation.  Prior history of GI bleeding with ablation of small bowel AVMs in 5/17. Most recent echo in 5/17 with EF 25-30%.  He drinks heavily and smokes about 1/2 ppd. He lives in an independent living facility.  He was admitted in 7/17 with acute on chronic systolic CHF and diuresed.    He returns for HF follow up.  Weight is up 10 lbs.  Had been doing well but says he is short of breath today.  Dyspneic walking to the car.  No orthopnea/PND.  No chest pain.   Smoking 1PPD. Drinking 4 ounces of bourbon a day.  No recent BRBPR or melena.  Lives alone.  Hydralazine cut back due to dizziness.  Appetite is good.    Labs (7/17): K 4.2, creatinine 2.62 Labs (08/12/2016) K 4.2 Creatinine 2.25.  Labs (9/17): K 4.2, creatinine 1.45, hgb 12.2  ECG (7/17): atrial fibrillation, RBBB  PMH: 1. HTN 2. Type II diabetes 3. H/o CVA 4. H/o GI bleeding: Small bowel AVM ablation in 5/17.  5. Prostate cancer 6. Bipolar disorder 7. ETOH abuse 8. PAD 9. CAD: DES to LCx in 2011, RCA noted to be chronically totally occluded.  10. Chronic systolic CHF: Suspect ischemic cardiomyopathy but may have contribution from ETOH abuse.   - Echo (8/15): EF 40-45%, mildly dilated RV. - Echo (5/17): EF 25-30%, normal RV size and systolic function.  11. CKD: Stage III.  12. Chronic atrial fibrillation  SH: Lives alone in an independent living facility.  1/2 gallon liquor/week.  Active smoker.    Family History  Problem Relation Age of Onset  . Cancer Father   . Cancer Mother   . Cancer Sister    ROS: All systems reviewed and negative except as per HPI.   Current Outpatient Prescriptions   Medication Sig Dispense Refill  . acetaminophen (TYLENOL) 325 MG tablet Take 650 mg by mouth every 8 (eight) hours as needed for mild pain or fever (pain).     Marland Kitchen albuterol (PROVENTIL HFA;VENTOLIN HFA) 108 (90 BASE) MCG/ACT inhaler Inhale 2 puffs into the lungs every 6 (six) hours as needed for shortness of breath.    Marland Kitchen ammonium lactate (LAC-HYDRIN) 12 % lotion Apply 1 application topically daily.    Marland Kitchen apixaban (ELIQUIS) 2.5 MG TABS tablet Take 2.5 mg by mouth every 12 (twelve) hours.    Marland Kitchen atorvastatin (LIPITOR) 40 MG tablet Take 1 tablet (40 mg total) by mouth daily. 90 tablet 3  . dextrose (GLUTOSE) 40 % GEL Take 1 Tube by mouth daily as needed for low blood sugar.     . feeding supplement, GLUCERNA SHAKE, (GLUCERNA SHAKE) LIQD Take 237 mLs by mouth 3 (three) times daily between meals.    . finasteride (PROSCAR) 5 MG tablet Take 5 mg by mouth daily.      . folic acid (FOLVITE) 1 MG tablet Take 1 tablet (1 mg total) by mouth daily. 30 tablet 0  . furosemide (LASIX) 40 MG tablet Take 2 tabs in AM and 1 tab in PM 90 tablet 6  . gabapentin (NEURONTIN) 300 MG capsule Take 300 mg by mouth 2 (two) times daily.     Marland Kitchen  guaifenesin (HUMIBID E) 400 MG TABS tablet Take 400 mg by mouth 3 (three) times daily as needed. For congestion    . hydrALAZINE (APRESOLINE) 25 MG tablet Take 12.5 mg by mouth 3 (three) times daily.    . insulin glargine (LANTUS) 100 UNIT/ML injection Inject 40 Units into the skin daily.    . insulin regular (NOVOLIN R,HUMULIN R) 100 units/mL injection Inject 5 Units into the skin See admin instructions. Inject 5 units under the skin before breakfast and inject 5 units before evening meal    . isosorbide mononitrate (IMDUR) 30 MG 24 hr tablet Take 15 mg by mouth daily.    . metoprolol succinate (TOPROL-XL) 50 MG 24 hr tablet Take 1 tablet (50 mg total) by mouth daily. 30 tablet 3  . Multiple Vitamins-Minerals (MULTIVITAMIN WITH MINERALS) tablet Take 1 tablet by mouth daily.    Marland Kitchen nystatin  (MYCOSTATIN) 100000 UNIT/ML suspension Take 5 mLs (500,000 Units total) by mouth 4 (four) times daily. 240 mL 0  . pantoprazole (PROTONIX) 40 MG tablet Take 1 tablet (40 mg total) by mouth daily. 30 tablet 11  . simethicone (MYLICON) 80 MG chewable tablet Chew 80 mg by mouth 3 (three) times daily as needed for flatulence. Take with meals as needed    . Tamsulosin HCl (FLOMAX) 0.4 MG CAPS Take 0.4 mg by mouth daily.      Marland Kitchen thiamine 100 MG tablet Take 1 tablet (100 mg total) by mouth daily. 30 tablet 0  . potassium chloride SA (K-DUR,KLOR-CON) 20 MEQ tablet Take 1 tablet (20 mEq total) by mouth daily. 90 tablet 3  . spironolactone (ALDACTONE) 25 MG tablet Take 0.5 tablets (12.5 mg total) by mouth daily. 15 tablet 3   No current facility-administered medications for this encounter.    BP (!) 152/87 (BP Location: Right Arm, Patient Position: Sitting, Cuff Size: Normal)   Pulse 92   Ht 6' (1.829 m)   Wt 193 lb 1.9 oz (87.6 kg)   BMI 26.19 kg/m  General: NAD Neck:  JVP 8-9 cm with HJR, no thyromegaly or thyroid nodule.  Lungs: Clear to auscultation bilaterally with normal respiratory effort. CV: Nondisplaced PMI.  Heart irregular S1/S2, no S3/S4, no murmur.  1+ ankle edema.  No carotid bruit.  Normal pedal pulses.  Abdomen: Soft, nontender, no hepatosplenomegaly, no distention.  Skin: Intact without lesions or rashes.  Neurologic: Alert and oriented x 3.  Psych: Normal affect. Extremities: No clubbing or cyanosis.  HEENT: Normal.   Assessment/Plan: 1. CAD: No chest pain.  He is not on ASA given Eliquis use.  He is on atorvastatin.   - Check lipids today. - Given fall in EF on most recent echo, will arrange for [employer].  Would avoid cath given CKD unless there is a large area of ischemia.  2. Chronic systolic CHF: EF 63-14% on 5/17 echo, which is down from 40-45% in 8/15. Suspect ischemic cardiomyopathy with possible contribution from ETOH.  He continues to drink.  He is volume  overloaded today with 10 lb weight gain. NYHA class II-III symptoms.   - Increase Lasix to 80 qam/40 qpm.  BMET/BNP today and repeat in 10 days.  - Continue hyralazine 12.5 mg three times a day + 15 mg imdur daily. .  - Continue Toprol XL  50 daily.  - Add spironolactone 12.5 daily.  - Hold off on ACEI/ARB/ARNI for now with elevated creatinine. - He needs to quit drinking ETOH but does not seem motivated to cut  back.  3. PAD: Really does not describe claudication.  Given CHF, he is off cilostazol.  4. CKD: Stage III.  BMET today and again in 10 days on higher Lasix dose.  5. ETOH abuse: Strongly encouraged to cut back/quit. 6. Smoking: Strongly encouraged to cut back/quit.  7. Atrial fibrillation: Chronic.  He is on apixaban, will continue.   Follow up in 3-4 wks. Continue paramedicine.   Loralie Champagne   10/03/2016

## 2016-10-12 ENCOUNTER — Ambulatory Visit (HOSPITAL_COMMUNITY)
Admission: RE | Admit: 2016-10-12 | Discharge: 2016-10-12 | Disposition: A | Discharge status: Home / Self Care | Payer: Medicare Other | Source: Ambulatory Visit | Attending: Internal Medicine | Admitting: Internal Medicine

## 2016-10-12 ENCOUNTER — Other Ambulatory Visit (HOSPITAL_COMMUNITY): Payer: Self-pay | Admitting: *Deleted

## 2016-10-12 DIAGNOSIS — I5022 Chronic systolic (congestive) heart failure: Secondary | ICD-10-CM

## 2016-10-12 DIAGNOSIS — I5023 Acute on chronic systolic (congestive) heart failure: Secondary | ICD-10-CM | POA: Insufficient documentation

## 2016-10-12 LAB — BASIC METABOLIC PANEL
Anion gap: 8 (ref 5–15)
BUN: 23 mg/dL — AB (ref 6–20)
CALCIUM: 8.8 mg/dL — AB (ref 8.9–10.3)
CO2: 32 mmol/L (ref 22–32)
CREATININE: 1.98 mg/dL — AB (ref 0.61–1.24)
Chloride: 101 mmol/L (ref 101–111)
GFR calc Af Amer: 35 mL/min — ABNORMAL LOW (ref >60)
GFR, EST NON AFRICAN AMERICAN: 31 mL/min — AB (ref >60)
GLUCOSE: 115 mg/dL — AB (ref 65–99)
POTASSIUM: 3.3 mmol/L — AB (ref 3.5–5.1)
SODIUM: 141 mmol/L (ref 135–145)

## 2016-10-12 MED ORDER — POTASSIUM CHLORIDE CRYS ER 20 MEQ PO TBCR: 40.0000 meq | EXTENDED_RELEASE_TABLET | Freq: Every day | ORAL | 3 refills | Status: DC

## 2016-10-22 ENCOUNTER — Telehealth (HOSPITAL_COMMUNITY): Payer: Self-pay | Admitting: *Deleted

## 2016-10-22 ENCOUNTER — Ambulatory Visit (HOSPITAL_COMMUNITY)
Admission: RE | Admit: 2016-10-22 | Discharge: 2016-10-22 | Disposition: A | Discharge status: Home / Self Care | Payer: Medicare Other | Source: Ambulatory Visit | Attending: Internal Medicine | Admitting: Internal Medicine

## 2016-10-22 DIAGNOSIS — I5022 Chronic systolic (congestive) heart failure: Secondary | ICD-10-CM | POA: Insufficient documentation

## 2016-10-22 DIAGNOSIS — I5023 Acute on chronic systolic (congestive) heart failure: Secondary | ICD-10-CM

## 2016-10-22 LAB — BASIC METABOLIC PANEL
Anion gap: 8 (ref 5–15)
BUN: 23 mg/dL — AB (ref 6–20)
CHLORIDE: 99 mmol/L — AB (ref 101–111)
CO2: 31 mmol/L (ref 22–32)
Calcium: 9.1 mg/dL (ref 8.9–10.3)
Creatinine, Ser: 1.76 mg/dL — ABNORMAL HIGH (ref 0.61–1.24)
GFR calc Af Amer: 41 mL/min — ABNORMAL LOW (ref >60)
GFR calc non Af Amer: 35 mL/min — ABNORMAL LOW (ref >60)
GLUCOSE: 175 mg/dL — AB (ref 65–99)
POTASSIUM: 3.5 mmol/L (ref 3.5–5.1)
Sodium: 138 mmol/L (ref 135–145)

## 2016-10-22 NOTE — Telephone Encounter (Signed)
Patient given detailed instructions per Myocardial Perfusion Study Information Sheet for the test on 10/27/16. Patient notified to arrive 15 minutes early and that it is imperative to arrive on time for appointment to keep from having the test rescheduled.  If you need to cancel or reschedule your appointment, please call the office within 24 hours of your appointment. Failure to do so may result in a cancellation of your appointment, and a $50 no show fee. Patient verbalized understanding.  Kirstie Peri

## 2016-10-27 ENCOUNTER — Ambulatory Visit (HOSPITAL_COMMUNITY): Payer: Medicare Other | Attending: Cardiology

## 2016-10-27 DIAGNOSIS — I739 Peripheral vascular disease, unspecified: Secondary | ICD-10-CM | POA: Insufficient documentation

## 2016-10-27 DIAGNOSIS — R9439 Abnormal result of other cardiovascular function study: Secondary | ICD-10-CM | POA: Insufficient documentation

## 2016-10-27 DIAGNOSIS — I10 Essential (primary) hypertension: Secondary | ICD-10-CM | POA: Insufficient documentation

## 2016-10-27 DIAGNOSIS — I2510 Atherosclerotic heart disease of native coronary artery without angina pectoris: Secondary | ICD-10-CM | POA: Diagnosis not present

## 2016-10-27 DIAGNOSIS — I4891 Unspecified atrial fibrillation: Secondary | ICD-10-CM | POA: Diagnosis not present

## 2016-10-27 DIAGNOSIS — I4510 Unspecified right bundle-branch block: Secondary | ICD-10-CM | POA: Diagnosis not present

## 2016-10-27 DIAGNOSIS — E119 Type 2 diabetes mellitus without complications: Secondary | ICD-10-CM | POA: Insufficient documentation

## 2016-10-27 DIAGNOSIS — R0600 Dyspnea, unspecified: Secondary | ICD-10-CM | POA: Insufficient documentation

## 2016-10-27 DIAGNOSIS — Z8673 Personal history of transient ischemic attack (TIA), and cerebral infarction without residual deficits: Secondary | ICD-10-CM | POA: Insufficient documentation

## 2016-10-27 LAB — MYOCARDIAL PERFUSION IMAGING
CHL CUP NUCLEAR SSS: 14
LHR: 0.41
LV dias vol: 180 mL (ref 62–150)
LVSYSVOL: 146 mL
NUC STRESS TID: 1.05
Peak HR: 78 {beats}/min
Rest HR: 78 {beats}/min
SDS: 3
SRS: 11

## 2016-10-27 MED ORDER — REGADENOSON 0.4 MG/5ML IV SOLN
0.4000 mg | Freq: Once | INTRAVENOUS | Status: AC
  Administered 2016-10-27: 0.4 mg via INTRAVENOUS

## 2016-10-27 MED ORDER — TECHNETIUM TC 99M TETROFOSMIN IV KIT
30.6000 | PACK | Freq: Once | INTRAVENOUS | Status: AC | PRN
  Administered 2016-10-27: 30.6 via INTRAVENOUS
  Filled 2016-10-27: qty 31

## 2016-10-27 MED ORDER — TECHNETIUM TC 99M TETROFOSMIN IV KIT
10.9000 | PACK | Freq: Once | INTRAVENOUS | Status: AC | PRN
  Administered 2016-10-27: 10.9 via INTRAVENOUS
  Filled 2016-10-27: qty 11

## 2016-10-29 ENCOUNTER — Ambulatory Visit (HOSPITAL_COMMUNITY)
Admission: RE | Admit: 2016-10-29 | Discharge: 2016-10-29 | Disposition: A | Discharge status: Home / Self Care | Payer: Medicare Other | Source: Ambulatory Visit | Attending: Internal Medicine | Admitting: Internal Medicine

## 2016-10-29 ENCOUNTER — Encounter (HOSPITAL_COMMUNITY): Payer: Self-pay

## 2016-10-29 VITALS — BP 150/62 | HR 87 | Wt 187.0 lb

## 2016-10-29 DIAGNOSIS — F319 Bipolar disorder, unspecified: Secondary | ICD-10-CM | POA: Diagnosis not present

## 2016-10-29 DIAGNOSIS — I10 Essential (primary) hypertension: Secondary | ICD-10-CM

## 2016-10-29 DIAGNOSIS — F17210 Nicotine dependence, cigarettes, uncomplicated: Secondary | ICD-10-CM | POA: Insufficient documentation

## 2016-10-29 DIAGNOSIS — Z79899 Other long term (current) drug therapy: Secondary | ICD-10-CM | POA: Insufficient documentation

## 2016-10-29 DIAGNOSIS — I482 Chronic atrial fibrillation, unspecified: Secondary | ICD-10-CM

## 2016-10-29 DIAGNOSIS — N183 Chronic kidney disease, stage 3 unspecified: Secondary | ICD-10-CM

## 2016-10-29 DIAGNOSIS — E785 Hyperlipidemia, unspecified: Secondary | ICD-10-CM

## 2016-10-29 DIAGNOSIS — E1169 Type 2 diabetes mellitus with other specified complication: Secondary | ICD-10-CM

## 2016-10-29 DIAGNOSIS — Z7901 Long term (current) use of anticoagulants: Secondary | ICD-10-CM | POA: Diagnosis not present

## 2016-10-29 DIAGNOSIS — Z955 Presence of coronary angioplasty implant and graft: Secondary | ICD-10-CM | POA: Insufficient documentation

## 2016-10-29 DIAGNOSIS — Z8546 Personal history of malignant neoplasm of prostate: Secondary | ICD-10-CM | POA: Insufficient documentation

## 2016-10-29 DIAGNOSIS — I130 Hypertensive heart and chronic kidney disease with heart failure and stage 1 through stage 4 chronic kidney disease, or unspecified chronic kidney disease: Secondary | ICD-10-CM | POA: Diagnosis not present

## 2016-10-29 DIAGNOSIS — Z9861 Coronary angioplasty status: Secondary | ICD-10-CM

## 2016-10-29 DIAGNOSIS — F1010 Alcohol abuse, uncomplicated: Secondary | ICD-10-CM | POA: Insufficient documentation

## 2016-10-29 DIAGNOSIS — I5022 Chronic systolic (congestive) heart failure: Secondary | ICD-10-CM | POA: Diagnosis not present

## 2016-10-29 DIAGNOSIS — I2510 Atherosclerotic heart disease of native coronary artery without angina pectoris: Secondary | ICD-10-CM | POA: Insufficient documentation

## 2016-10-29 DIAGNOSIS — Z794 Long term (current) use of insulin: Secondary | ICD-10-CM | POA: Diagnosis not present

## 2016-10-29 DIAGNOSIS — Z8673 Personal history of transient ischemic attack (TIA), and cerebral infarction without residual deficits: Secondary | ICD-10-CM | POA: Diagnosis not present

## 2016-10-29 DIAGNOSIS — E1122 Type 2 diabetes mellitus with diabetic chronic kidney disease: Secondary | ICD-10-CM | POA: Insufficient documentation

## 2016-10-29 LAB — BASIC METABOLIC PANEL
Anion gap: 7 (ref 5–15)
BUN: 35 mg/dL — AB (ref 6–20)
CALCIUM: 8.9 mg/dL (ref 8.9–10.3)
CO2: 28 mmol/L (ref 22–32)
CREATININE: 2.09 mg/dL — AB (ref 0.61–1.24)
Chloride: 101 mmol/L (ref 101–111)
GFR calc Af Amer: 33 mL/min — ABNORMAL LOW (ref >60)
GFR, EST NON AFRICAN AMERICAN: 29 mL/min — AB (ref >60)
GLUCOSE: 347 mg/dL — AB (ref 65–99)
Potassium: 4.7 mmol/L (ref 3.5–5.1)
Sodium: 136 mmol/L (ref 135–145)

## 2016-10-29 LAB — BRAIN NATRIURETIC PEPTIDE: B Natriuretic Peptide: 302.5 pg/mL — ABNORMAL HIGH (ref 0.0–100.0)

## 2016-10-29 MED ORDER — SPIRONOLACTONE 25 MG PO TABS
25.0000 mg | ORAL_TABLET | Freq: Every day | ORAL | 3 refills | Status: DC
Start: 2016-10-29 — End: 2017-02-19

## 2016-10-29 NOTE — Progress Notes (Signed)
PCP: Dr. Koleen Nimrod Cardiology: Dr. Aundra Dubin  80 yo with history of HTN, CKD stage III, type II diabetes, prior CVA, CKD stage III, CAD, ischemic cardiomyopathy, PAD, chronic atrial fibrillation, and ETOH abuse presents for cardiology evaluation.  He had a LCx stent in 2011 and has a known occluded RCA.  He has been in chronic atrial fibrillation.  Prior history of GI bleeding with ablation of small bowel AVMs in 5/17. Most recent echo in 5/17 with EF 25-30%.  He drinks heavily and smokes about 1/2 ppd. He lives in an independent living facility.  He was admitted in 7/17 with acute on chronic systolic CHF and diuresed.    He returns today for regular follow up. Weight down 7 lbs from last visit. Has been feeling good, but very depressed on hearing his heart function depressed. Breathing is "good". Not SOB walking up from garage. Back bothers him as well with prolonged standing. Only occasional SOB with bathing. Continues to smoke 1 PPD. Having 2-3 bourbons a day. Denies any chest pain, including walking around to multiple offices.  No BRBPR or melena. Stool black with Iron. Appetite is strong.   Myoview 10/27/16 with EF 19% with inferolateral akinesis- infarct. Large severe defect in the basal inferior, basal inferolateral, mid inferior and mid inferolateral location consistent with previous MI.   Intermediate risk study.  Labs (7/17): K 4.2, creatinine 2.62 Labs (08/12/2016) K 4.2 Creatinine 2.25.  Labs (9/17): K 4.2, creatinine 1.45, hgb 12.2  ECG (7/17): atrial fibrillation, RBBB  PMH: 1. HTN 2. Type II diabetes 3. H/o CVA 4. H/o GI bleeding: Small bowel AVM ablation in 5/17.  5. Prostate cancer 6. Bipolar disorder 7. ETOH abuse 8. PAD 9. CAD: DES to LCx in 2011, RCA noted to be chronically totally occluded.  10. Chronic systolic CHF: Suspect ischemic cardiomyopathy but may have contribution from ETOH abuse.   - Echo (8/15): EF 40-45%, mildly dilated RV. - Echo (5/17): EF 25-30%, normal RV  size and systolic function.  11. CKD: Stage III.  12. Chronic atrial fibrillation  SH: Lives alone in an independent living facility.  1/2 gallon liquor/week.  Active smoker.    Family History  Problem Relation Age of Onset  . Cancer Father   . Cancer Mother   . Cancer Sister    ROS: All systems reviewed and negative except as per HPI.   Current Outpatient Prescriptions  Medication Sig Dispense Refill  . acetaminophen (TYLENOL) 325 MG tablet Take 650 mg by mouth every 8 (eight) hours as needed for mild pain or fever (pain).     Marland Kitchen albuterol (PROVENTIL HFA;VENTOLIN HFA) 108 (90 BASE) MCG/ACT inhaler Inhale 2 puffs into the lungs every 6 (six) hours as needed for shortness of breath.    Marland Kitchen ammonium lactate (LAC-HYDRIN) 12 % lotion Apply 1 application topically daily.    Marland Kitchen apixaban (ELIQUIS) 2.5 MG TABS tablet Take 2.5 mg by mouth every 12 (twelve) hours.    Marland Kitchen atorvastatin (LIPITOR) 40 MG tablet Take 1 tablet (40 mg total) by mouth daily. 90 tablet 3  . dextrose (GLUTOSE) 40 % GEL Take 1 Tube by mouth daily as needed for low blood sugar.     . feeding supplement, GLUCERNA SHAKE, (GLUCERNA SHAKE) LIQD Take 237 mLs by mouth 3 (three) times daily between meals.    . finasteride (PROSCAR) 5 MG tablet Take 5 mg by mouth daily.      . folic acid (FOLVITE) 1 MG tablet Take 1 tablet (1 mg  total) by mouth daily. 30 tablet 0  . furosemide (LASIX) 40 MG tablet Take 2 tabs in AM and 1 tab in PM 90 tablet 6  . gabapentin (NEURONTIN) 300 MG capsule Take 300 mg by mouth 2 (two) times daily.     Marland Kitchen guaifenesin (HUMIBID E) 400 MG TABS tablet Take 400 mg by mouth 3 (three) times daily as needed. For congestion    . hydrALAZINE (APRESOLINE) 25 MG tablet Take 12.5 mg by mouth 3 (three) times daily.    . insulin glargine (LANTUS) 100 UNIT/ML injection Inject 40 Units into the skin daily.    . insulin regular (NOVOLIN R,HUMULIN R) 100 units/mL injection Inject 5 Units into the skin See admin instructions. Inject 5  units under the skin before breakfast and inject 5 units before evening meal    . isosorbide mononitrate (IMDUR) 30 MG 24 hr tablet Take 15 mg by mouth daily.    . metoprolol succinate (TOPROL-XL) 50 MG 24 hr tablet Take 1 tablet (50 mg total) by mouth daily. 30 tablet 3  . Multiple Vitamins-Minerals (MULTIVITAMIN WITH MINERALS) tablet Take 1 tablet by mouth daily.    Marland Kitchen nystatin (MYCOSTATIN) 100000 UNIT/ML suspension Take 5 mLs (500,000 Units total) by mouth 4 (four) times daily. 240 mL 0  . pantoprazole (PROTONIX) 40 MG tablet Take 1 tablet (40 mg total) by mouth daily. 30 tablet 11  . potassium chloride SA (K-DUR,KLOR-CON) 20 MEQ tablet Take 2 tablets (40 mEq total) by mouth daily. 180 tablet 3  . simethicone (MYLICON) 80 MG chewable tablet Chew 80 mg by mouth 3 (three) times daily as needed for flatulence. Take with meals as needed    . spironolactone (ALDACTONE) 25 MG tablet Take 1 tablet (25 mg total) by mouth daily. 90 tablet 3  . Tamsulosin HCl (FLOMAX) 0.4 MG CAPS Take 0.4 mg by mouth daily.      Marland Kitchen thiamine 100 MG tablet Take 1 tablet (100 mg total) by mouth daily. 30 tablet 0   No current facility-administered medications for this encounter.    BP (!) 150/62 (BP Location: Left Arm, Patient Position: Sitting, Cuff Size: Normal)   Pulse 87   Wt 187 lb (84.8 kg)   SpO2 97%   BMI 25.36 kg/m    Wt Readings from Last 3 Encounters:  10/29/16 187 lb (84.8 kg)  10/27/16 193 lb (87.5 kg)  10/02/16 193 lb 1.9 oz (87.6 kg)     General: NAD Neck:  JVP 7-8 cm with mild HJR, No thyromegaly or nodule noted.   Lungs: Diminished throughout, no wheezes, rales, or rhonchi.  CV: Nondisplaced PMI.  Heart irregular S1/S2, no S3/S4, no murmur.  1+ ankle edema.  No carotid bruit.  Normal pedal pulses.  Abdomen: Soft, nontender, no hepatosplenomegaly, no distention.  Skin: Intact without lesions or rashes.  Neurologic: Alert and oriented x 3.  Psych: Normal affect. Extremities: No clubbing or  cyanosis.  HEENT: Normal.   Assessment/Plan: 1. CAD: No chest pain.  He is not on ASA given Eliquis use.  He is on atorvastatin.   - Lipids stable on check 10/02/16.  - Myoview 10/27/16 with EF 19% with inferolateral akinesis- infarct. Large severe defect in the basal inferior, basal inferolateral, mid inferior and mid inferolateral location consistent with previous MI.   Intermediate risk study. - Continue medical therapy. 2. Chronic systolic CHF: EF 48-54% on 5/17 echo, which is down from 40-45% in 8/15. Suspect ischemic cardiomyopathy with possible contribution from ETOH.  He  continues to drink.  He is volume overloaded today with 10 lb weight gain. NYHA class II-III symptoms.   - Continue Lasix 80 qam/40 qpm.  BMET/BNP today.  - Continue hyralazine 12.5 mg three times a day + 15 mg imdur daily. .  - Continue Toprol XL  50 daily.  - Increase spironolactone to 25 mg daily. Recheck BMET 7-10 days. - Hold off on ACEI/ARB/ARNI for now with elevated creatinine. - He needs to quit drinking, but does not want to cut back.   3. PAD:  - No real complaints of claudication.  - No cilostazol given CHF.  4. CKD: Stage III - BMET today and 10 days with increasing spiro. BMET today and again in 10 days on higher Lasix dose.  5. ETOH abuse:  - Drinks 2-3 bourbons a day, suspect this is an underestimate.  - He refuses cessation at this time.  6. Smoking: - Still smoking 1 pack per day. Encouraged to cut back. He says he will consider.  7. Atrial fibrillation: Chronic.   - Continue eliquis 2.5 mg BID.  - No overt bleeding.   Increase spiro with HTN and recent borderline hypokalemia. BMET/BNP today.  Repeat in 10 days. Follow up in 4-6 weeks. Continue paramedicine.   Will need to continue lifestyle modification conversation, but patient has very low motivation to stop smoking and drinking.  Shirley Friar, PA-C 10/29/2016

## 2016-10-29 NOTE — Patient Instructions (Signed)
INCREASE Spironolactone to 25 mg (1 whole tablet) once daily.  Routine lab work today. Will notify you of abnormal results, otherwise no news is good news!  Repeat labs in 10 days.  Follow up 4-6 weeks with Dr. Aundra Dubin.  Do the following things EVERYDAY: 1) Weigh yourself in the morning before breakfast. Write it down and keep it in a log. 2) Take your medicines as prescribed 3) Eat low salt foods-Limit salt (sodium) to 2000 mg per day.  4) Stay as active as you can everyday 5) Limit all fluids for the day to less than 2 liters

## 2016-11-02 ENCOUNTER — Telehealth (HOSPITAL_COMMUNITY): Payer: Self-pay | Admitting: Cardiology

## 2016-11-02 DIAGNOSIS — I509 Heart failure, unspecified: Secondary | ICD-10-CM

## 2016-11-02 MED ORDER — POTASSIUM CHLORIDE CRYS ER 20 MEQ PO TBCR: 20.0000 meq | EXTENDED_RELEASE_TABLET | Freq: Every day | ORAL | 3 refills | Status: DC

## 2016-11-02 NOTE — Telephone Encounter (Signed)
Patient aware and voiced understanding regarding lab result. Recheck labs 11/9 '@1030'$ 

## 2016-11-02 NOTE — Telephone Encounter (Signed)
-----   Message from Shirley Friar, PA-C sent at 11/02/2016  8:11 AM EST ----- Please address. Thank you.    Legrand Como [address]" [place], PA-C 11/02/2016 8:11 AM

## 2016-11-05 ENCOUNTER — Inpatient Hospital Stay (HOSPITAL_COMMUNITY): Admission: RE | Admit: 2016-11-05 | Payer: Self-pay | Source: Ambulatory Visit

## 2016-11-09 ENCOUNTER — Ambulatory Visit (HOSPITAL_COMMUNITY)
Admission: RE | Admit: 2016-11-09 | Discharge: 2016-11-09 | Disposition: A | Discharge status: Home / Self Care | Payer: Medicare Other | Source: Ambulatory Visit | Attending: Cardiology | Admitting: Cardiology

## 2016-11-09 ENCOUNTER — Other Ambulatory Visit (HOSPITAL_COMMUNITY): Payer: Self-pay

## 2016-11-09 DIAGNOSIS — I509 Heart failure, unspecified: Secondary | ICD-10-CM | POA: Insufficient documentation

## 2016-11-09 LAB — BASIC METABOLIC PANEL
Anion gap: 9 (ref 5–15)
BUN: 42 mg/dL — AB (ref 6–20)
CHLORIDE: 100 mmol/L — AB (ref 101–111)
CO2: 29 mmol/L (ref 22–32)
Calcium: 9.1 mg/dL (ref 8.9–10.3)
Creatinine, Ser: 2.35 mg/dL — ABNORMAL HIGH (ref 0.61–1.24)
GFR calc Af Amer: 29 mL/min — ABNORMAL LOW (ref >60)
GFR calc non Af Amer: 25 mL/min — ABNORMAL LOW (ref >60)
Glucose, Bld: 251 mg/dL — ABNORMAL HIGH (ref 65–99)
POTASSIUM: 4.7 mmol/L (ref 3.5–5.1)
SODIUM: 138 mmol/L (ref 135–145)

## 2016-11-10 ENCOUNTER — Telehealth (HOSPITAL_COMMUNITY): Payer: Self-pay | Admitting: *Deleted

## 2016-11-10 MED ORDER — FUROSEMIDE 20 MG PO TABS: ORAL_TABLET | ORAL | 3 refills | Status: DC

## 2016-11-10 NOTE — Telephone Encounter (Signed)
-----   Message from Shirley Friar, PA-C sent at 11/09/2016 11:57 AM EST ----- Creatinine trending up.  K remains upper normal.  Please have him hold Lasix tomorrow, then decrease to 20 mg BID after that with an extra 20 mg as needed for weight gain of 3 lbs overnight or 5 lbs within one week.  Can stop K supp.   Continue Arlyce Harman for now.  Needs BMET repeat 7-10 days.   Can you add appt to follow up with me in 2 weeks?  Keep appt with Dr. Aundra Dubin in 4 weeks.   Legrand Como [address] [place], PA-C 11/09/2016 11:57 AM

## 2016-11-10 NOTE — Telephone Encounter (Signed)
Notes Recorded by Harvie Junior, CMA on 11/10/2016 at 3:40 PM EST Katie aware of all medication changes and will go over with patient. ------  Notes Recorded by Harvie Junior, Enoch on 11/10/2016 at 3:39 PM EST Katie with paramedicine came by to make patient an appt. She called him and he said he did not want to see PA/NP again he only wanted to see the MD. He was told to keep his 12/8 appointment with the doctor and I made him a 7-10 lab visit per Doroteo Bradford. ------  Notes Recorded by Shirley Friar, PA-C on 11/09/2016 at 11:57 AM EST Creatinine trending up. K remains upper normal.  Please have him hold Lasix tomorrow, then decrease to 20 mg BID after that with an extra 20 mg as needed for weight gain of 3 lbs overnight or 5 lbs within one week.  Can stop K supp.  Continue Arlyce Harman for now. Needs BMET repeat 7-10 days.   Can you add appt to follow up with me in 2 weeks? Keep appt with Dr. Aundra Dubin in 4 weeks.   Beryle Beams" Joy, PA-C 11/09/2016 11:57 AM      Ref Range & Units 1d ago 12d ago 2wk ago   Sodium 135 - 145 mmol/L 138  136  138    Potassium 3.5 - 5.1 mmol/L 4.7  4.7  3.5    Chloride 101 - 111 mmol/L 100   101  99     CO2 22 - 32 mmol/L '29  28  31    '$ Glucose, Bld 65 - 99 mg/dL 251   347   175     BUN 6 - 20 mg/dL 42   35   23     Creatinine, Ser 0.61 - 1.24 mg/dL 2.35   2.09   1.76     Calcium 8.9 - 10.3 mg/dL 9.1  8.9  9.1    GFR calc non Af Amer >60 mL/min 25   29   35     GFR calc Af Amer >60 mL/min 29   33CM   41CM

## 2016-11-23 ENCOUNTER — Ambulatory Visit (HOSPITAL_COMMUNITY)
Admission: RE | Admit: 2016-11-23 | Discharge: 2016-11-23 | Disposition: A | Discharge status: Home / Self Care | Payer: Medicare Other | Source: Ambulatory Visit | Attending: Cardiology | Admitting: Cardiology

## 2016-11-23 ENCOUNTER — Other Ambulatory Visit (HOSPITAL_COMMUNITY): Payer: Self-pay | Admitting: *Deleted

## 2016-11-23 DIAGNOSIS — I5022 Chronic systolic (congestive) heart failure: Secondary | ICD-10-CM | POA: Insufficient documentation

## 2016-11-23 LAB — BASIC METABOLIC PANEL
Anion gap: 9 (ref 5–15)
BUN: 24 mg/dL — AB (ref 6–20)
CHLORIDE: 102 mmol/L (ref 101–111)
CO2: 27 mmol/L (ref 22–32)
Calcium: 9.1 mg/dL (ref 8.9–10.3)
Creatinine, Ser: 1.97 mg/dL — ABNORMAL HIGH (ref 0.61–1.24)
GFR calc Af Amer: 36 mL/min — ABNORMAL LOW (ref >60)
GFR calc non Af Amer: 31 mL/min — ABNORMAL LOW (ref >60)
GLUCOSE: 161 mg/dL — AB (ref 65–99)
POTASSIUM: 3.6 mmol/L (ref 3.5–5.1)
SODIUM: 138 mmol/L (ref 135–145)

## 2016-11-26 ENCOUNTER — Telehealth (HOSPITAL_COMMUNITY): Payer: Self-pay | Admitting: *Deleted

## 2016-11-26 NOTE — Telephone Encounter (Signed)
Last office notes faxed to [phone] as requested.

## 2016-12-04 ENCOUNTER — Ambulatory Visit (HOSPITAL_COMMUNITY)
Admission: RE | Admit: 2016-12-04 | Discharge: 2016-12-04 | Disposition: A | Discharge status: Home / Self Care | Payer: Medicare Other | Source: Ambulatory Visit | Attending: Cardiology | Admitting: Cardiology

## 2016-12-04 ENCOUNTER — Telehealth (HOSPITAL_COMMUNITY): Payer: Self-pay

## 2016-12-04 ENCOUNTER — Encounter (HOSPITAL_COMMUNITY): Payer: Self-pay

## 2016-12-04 VITALS — BP 120/58 | HR 94 | Wt 175.2 lb

## 2016-12-04 DIAGNOSIS — N183 Chronic kidney disease, stage 3 unspecified: Secondary | ICD-10-CM

## 2016-12-04 DIAGNOSIS — I482 Chronic atrial fibrillation, unspecified: Secondary | ICD-10-CM

## 2016-12-04 DIAGNOSIS — I5022 Chronic systolic (congestive) heart failure: Secondary | ICD-10-CM

## 2016-12-04 DIAGNOSIS — Z9861 Coronary angioplasty status: Secondary | ICD-10-CM

## 2016-12-04 DIAGNOSIS — I2510 Atherosclerotic heart disease of native coronary artery without angina pectoris: Secondary | ICD-10-CM | POA: Diagnosis not present

## 2016-12-04 LAB — CBC
HEMATOCRIT: 40.9 % (ref 39.0–52.0)
HEMOGLOBIN: 13.3 g/dL (ref 13.0–17.0)
MCH: 28.1 pg (ref 26.0–34.0)
MCHC: 32.5 g/dL (ref 30.0–36.0)
MCV: 86.3 fL (ref 78.0–100.0)
Platelets: 278 10*3/uL (ref 150–400)
RBC: 4.74 MIL/uL (ref 4.22–5.81)
RDW: 14.8 % (ref 11.5–15.5)
WBC: 5.6 10*3/uL (ref 4.0–10.5)

## 2016-12-04 LAB — BASIC METABOLIC PANEL
Anion gap: 8 (ref 5–15)
BUN: 29 mg/dL — AB (ref 6–20)
CHLORIDE: 101 mmol/L (ref 101–111)
CO2: 27 mmol/L (ref 22–32)
Calcium: 9.4 mg/dL (ref 8.9–10.3)
Creatinine, Ser: 2.11 mg/dL — ABNORMAL HIGH (ref 0.61–1.24)
GFR calc Af Amer: 33 mL/min — ABNORMAL LOW (ref >60)
GFR calc non Af Amer: 28 mL/min — ABNORMAL LOW (ref >60)
GLUCOSE: 163 mg/dL — AB (ref 65–99)
POTASSIUM: 4.3 mmol/L (ref 3.5–5.1)
Sodium: 136 mmol/L (ref 135–145)

## 2016-12-04 MED ORDER — APIXABAN 5 MG PO TABS: 5.0000 mg | ORAL_TABLET | Freq: Two times a day (BID) | ORAL | 3 refills | Status: AC

## 2016-12-04 MED ORDER — HYDRALAZINE HCL 25 MG PO TABS: 25.0000 mg | ORAL_TABLET | Freq: Three times a day (TID) | ORAL | 6 refills | Status: DC

## 2016-12-04 MED ORDER — ISOSORBIDE MONONITRATE ER 30 MG PO TB24: 30.0000 mg | ORAL_TABLET | Freq: Every day | ORAL | 3 refills | Status: DC

## 2016-12-04 MED ORDER — NICOTINE 10 MG IN INHA: 1.0000 | RESPIRATORY_TRACT | 0 refills | Status: DC | PRN

## 2016-12-04 MED ORDER — FUROSEMIDE 20 MG PO TABS: 20.0000 mg | ORAL_TABLET | Freq: Two times a day (BID) | ORAL | Status: DC

## 2016-12-04 NOTE — Telephone Encounter (Signed)
Volume looks ok, so would continue to take Lasix as he has been doing.

## 2016-12-04 NOTE — Patient Instructions (Signed)
Please call our office to confirm your lasix dose and to provide your [place] office fax #.  INCREASE Imdur to 30 mg (1 whole tablet) once daily.  INCREASE Hydralazine to 25 mg (1 whole tablet) once every 8 hours (three times daily).  INCREASE Eliquis to 5 mg (1 whole tablet) once daily.  Routine lab work today. Will notify you of abnormal results, otherwise no news is good news!  Follow up 6 weeks with Dr. Aundra Dubin.  Merry Christmas and Happy New Year!  Do the following things EVERYDAY: 1) Weigh yourself in the morning before breakfast. Write it down and keep it in a log. 2) Take your medicines as prescribed 3) Eat low salt foods-Limit salt (sodium) to 2000 mg per day.  4) Stay as active as you can everyday 5) Limit all fluids for the day to less than 2 liters

## 2016-12-04 NOTE — Telephone Encounter (Signed)
Patient called CHF clinic triage to clarify lasix dose. Reports bottle states Take 1 tablet twice daily, 20 mg tablets.  Patient reports taking what is said on the bottle.  However, in clinic he reported taking 40 mg in am (2 tabs) and 10 mg in pm (1/2 tab).  Also states he is using a Nic-cotrol (sp?) inhaler to quit smoking, asking if Dr. Aundra Dubin will refill this.  Will forward this info to Dr. Aundra Dubin to address and to see if any new orders/changes need to be made.  Renee Pain, RN

## 2016-12-04 NOTE — Addendum Note (Signed)
Addended by: Effie Berkshire on: 12/04/2016 02:29 PM   Modules accepted: Orders

## 2016-12-05 NOTE — Progress Notes (Signed)
PCP: Dr. Koleen Nimrod Cardiology: Dr. Aundra Dubin  80 yo with history of HTN, CKD stage III, type II diabetes, prior CVA, CKD stage III, CAD, ischemic cardiomyopathy, PAD, chronic atrial fibrillation, and ETOH abuse presents for cardiology evaluation.  He had a LCx stent in 2011 and has a known occluded RCA.  He has been in chronic atrial fibrillation.  Prior history of GI bleeding with ablation of small bowel AVMs in 5/17. Most recent echo in 5/17 with EF 25-30%.  He drinks heavily and smokes about 1/2 ppd. He lives in an independent living facility.  He was admitted in 7/17 with acute on chronic systolic CHF and diuresed.    Myoview 10/27/16 with EF 19% with large severe defect in the basal inferior, basal inferolateral, mid inferior and mid inferolateral location consistent with previous MI.    Weight is down 12 lbs since last appointment. He is still drinking fairly heavily and smoking about 1/2 ppd.  He is using a nicotine inhaler to try to cut back.  No chest pain.  He walks with a cane for balance.  No dyspnea on flat ground.  +bendopnea.  No orthopnea/PND.  He is somewhat unsure about his Lasix dose currently.    Labs (7/17): K 4.2, creatinine 2.62 Labs (08/12/2016) K 4.2, creatinine 2.25.  Labs (9/17): K 4.2, creatinine 1.45, hgb 12.2 Labs (10/17): LDL 44 Labs (11/17): K 3.6, creatinine 1.97  ECG (7/17): atrial fibrillation, RBBB  PMH: 1. HTN 2. Type II diabetes 3. H/o CVA 4. H/o GI bleeding: Small bowel AVM ablation in 5/17.  5. Prostate cancer 6. Bipolar disorder 7. ETOH abuse 8. PAD 9. CAD: DES to LCx in 2011, RCA noted to be chronically totally occluded.  - Myoview 10/27/16 with EF 19% with large severe defect in the basal inferior, basal inferolateral, mid inferior and mid inferolateral location consistent with previous MI.   10. Chronic systolic CHF: Suspect ischemic cardiomyopathy but may have contribution from ETOH abuse.   - Echo (8/15): EF 40-45%, mildly dilated RV. - Echo  (5/17): EF 25-30%, normal RV size and systolic function.  11. CKD: Stage III.  12. Chronic atrial fibrillation  SH: Lives alone in an independent living facility.  1/2 gallon liquor/week.  Active smoker.    Family History  Problem Relation Age of Onset  . Cancer Father   . Cancer Mother   . Cancer Sister    ROS: All systems reviewed and negative except as per HPI.   Current Outpatient Prescriptions  Medication Sig Dispense Refill  . acetaminophen (TYLENOL) 325 MG tablet Take 650 mg by mouth every 8 (eight) hours as needed for mild pain or fever (pain).     Marland Kitchen albuterol (PROVENTIL HFA;VENTOLIN HFA) 108 (90 BASE) MCG/ACT inhaler Inhale 2 puffs into the lungs every 6 (six) hours as needed for shortness of breath.    Marland Kitchen ammonium lactate (LAC-HYDRIN) 12 % lotion Apply 1 application topically daily.    Marland Kitchen apixaban (ELIQUIS) 5 MG TABS tablet Take 1 tablet (5 mg total) by mouth every 12 (twelve) hours. 60 tablet 3  . atorvastatin (LIPITOR) 40 MG tablet Take 1 tablet (40 mg total) by mouth daily. 90 tablet 3  . dextrose (GLUTOSE) 40 % GEL Take 1 Tube by mouth daily as needed for low blood sugar.     . feeding supplement, GLUCERNA SHAKE, (GLUCERNA SHAKE) LIQD Take 237 mLs by mouth 3 (three) times daily between meals.    . finasteride (PROSCAR) 5 MG tablet Take 5 mg  by mouth daily.      . folic acid (FOLVITE) 1 MG tablet Take 1 tablet (1 mg total) by mouth daily. 30 tablet 0  . gabapentin (NEURONTIN) 300 MG capsule Take 300 mg by mouth 2 (two) times daily.     Marland Kitchen guaifenesin (HUMIBID E) 400 MG TABS tablet Take 400 mg by mouth 3 (three) times daily as needed. For congestion    . hydrALAZINE (APRESOLINE) 25 MG tablet Take 1 tablet (25 mg total) by mouth 3 (three) times daily. 90 tablet 6  . insulin glargine (LANTUS) 100 UNIT/ML injection Inject 40 Units into the skin daily.    . insulin regular (NOVOLIN R,HUMULIN R) 100 units/mL injection Inject 5 Units into the skin See admin instructions. Inject 5  units under the skin before breakfast and inject 5 units before evening meal    . isosorbide mononitrate (IMDUR) 30 MG 24 hr tablet Take 1 tablet (30 mg total) by mouth daily. 90 tablet 3  . metoprolol succinate (TOPROL-XL) 50 MG 24 hr tablet Take 1 tablet (50 mg total) by mouth daily. 30 tablet 3  . Multiple Vitamins-Minerals (MULTIVITAMIN WITH MINERALS) tablet Take 1 tablet by mouth daily.    Marland Kitchen nystatin (MYCOSTATIN) 100000 UNIT/ML suspension Take 5 mLs (500,000 Units total) by mouth 4 (four) times daily. 240 mL 0  . pantoprazole (PROTONIX) 40 MG tablet Take 1 tablet (40 mg total) by mouth daily. 30 tablet 11  . simethicone (MYLICON) 80 MG chewable tablet Chew 80 mg by mouth 3 (three) times daily as needed for flatulence. Take with meals as needed    . spironolactone (ALDACTONE) 25 MG tablet Take 1 tablet (25 mg total) by mouth daily. 90 tablet 3  . Tamsulosin HCl (FLOMAX) 0.4 MG CAPS Take 0.4 mg by mouth daily.      Marland Kitchen thiamine 100 MG tablet Take 1 tablet (100 mg total) by mouth daily. 30 tablet 0  . furosemide (LASIX) 20 MG tablet Take 1 tablet (20 mg total) by mouth 2 (two) times daily. 30 tablet   . nicotine (NICOTROL) 10 MG inhaler Inhale 1 Cartridge (1 continuous puffing total) into the lungs as needed for smoking cessation. 42 each 0   No current facility-administered medications for this encounter.    BP (!) 120/58   Pulse 94   Wt 175 lb 4 oz (79.5 kg)   SpO2 100%   BMI 23.77 kg/m    Wt Readings from Last 3 Encounters:  12/04/16 175 lb 4 oz (79.5 kg)  10/29/16 187 lb (84.8 kg)  10/27/16 193 lb (87.5 kg)     General: NAD Neck:  JVP 7 cm with mild HJR, No thyromegaly or nodule noted.   Lungs: Slight crackles at bases bilaterally.  CV: Nondisplaced PMI.  Heart irregular S1/S2, no S3/S4, no murmur.  No edema.  No carotid bruit.  Normal pedal pulses.  Abdomen: Soft, nontender, no hepatosplenomegaly, no distention.  Skin: Intact without lesions or rashes.  Neurologic: Alert and  oriented x 3.  Psych: Normal affect. Extremities: No clubbing or cyanosis.  HEENT: Normal.   Assessment/Plan: 1. CAD: No chest pain.  Cardiolite 10/17 with infarct, no ischemia.  He is not on ASA given Eliquis use.  He is on atorvastatin.   2. Chronic systolic CHF: EF 60-10% on 5/17 echo, which is down from 40-45% in 8/15. Suspect ischemic cardiomyopathy with possible contribution from ETOH.  He continues to drink fairly heavily.  Weight is down today and he is not volume  overloaded on exam.  NYHA class II-III. - Continue Lasix at current dose.  I am unclear how much he is actually taking, he will call back about this.  BMET today.   - Increase hydralazine to 25 mg tid and Imdur to 30 mg daily.   - Continue Toprol XL  50 daily.  - Continue spironolactone 25 daily. - Hold off on ACEI/ARB/ARNI for now with elevated creatinine. - He needs to quit drinking, says he will try to cut back.   3. PAD: No claudication reported.  - No cilostazol given CHF.  4. CKD: Stage III.  BMET today.  5. ETOH abuse: We discussed cutting back.  I am concerned it has contributed to his cardiomyopathy.  6. Smoking: He is trying to cut back using nicotine inhaler.   7. Atrial fibrillation: Chronic. No recent GI bleeding.   - Eliquis is under-dosed, increase to 5 mg bid.  - CBC today.    [name],  12/05/2016

## 2016-12-15 ENCOUNTER — Telehealth (HOSPITAL_COMMUNITY): Payer: Self-pay | Admitting: *Deleted

## 2016-12-15 MED ORDER — NICOTINE 10 MG IN INHA: 1.0000 | RESPIRATORY_TRACT | 0 refills | Status: DC | PRN

## 2016-12-15 NOTE — Telephone Encounter (Signed)
Katie called yesterday afternoon at 5:52 pm and said patient has been feeling dizzy with episodes of loose stools.  She checked orthos and patient's blood pressure systolic dropped from 756 to 102.  Patient also told her that he hadn't been eating much and was drinking Glucerna shakes.  Called Mr. Wilson this morning and spoke to him regarding his symptoms.  His last loose stool was yesterday morning and he is no longer feeling dizzy.  Will tell Katie to follow up with him and let us know if he starts having anymore symptoms. Patient also requested for a prescription for Nicotrol Inhaler to be sent to his [facility] pharmacy because they never received it from his last appointment with [place].  I have sent to pharmacy as requested.

## 2017-01-18 ENCOUNTER — Encounter (HOSPITAL_COMMUNITY): Payer: Self-pay

## 2017-01-29 ENCOUNTER — Telehealth (HOSPITAL_COMMUNITY): Payer: Self-pay

## 2017-01-29 NOTE — Telephone Encounter (Signed)
[name] with paramedicine called CHF clinic and left a VM afterhours yesterday evening to report 8 lb weight gain overnight.  No SOB, cough chest pain, swelling noted, and patient reports feeling fine otherwise. Attempted to follow up with patient directly this morning, no answer, left VM to call back.  Renee Pain, RN

## 2017-01-29 NOTE — Telephone Encounter (Signed)
Patient returned call, weight 196 lbs this am. Weighed this afternoon, down to 191 lbs. Possibly mismeasurment. Patient feels good, does report difficulty passing urine recently (prostate issues?), had radiation to prostate in the past around 10 years ago. Advised to follow up with PCP who sees him periodically to check prostate, may need possible urology referral. Patient states this issue has resolved and only lasted a day.  Reminded of upcoming apt with CHF clinic on 2.23 and to call with any further issues. Aware and agreeable.  Renee Pain, RN

## 2017-02-09 ENCOUNTER — Other Ambulatory Visit (HOSPITAL_COMMUNITY): Payer: Self-pay

## 2017-02-09 NOTE — Progress Notes (Signed)
Paramedicine Encounter    Patient ID: [name], male    DOB: 11/30/1936, 81 y.o.   MRN: [id number]   Patient Care Team: Beacher May, MD as PCP - General (Internal Medicine)  Patient Active Problem List   Diagnosis Date Noted  . Chronic systolic CHF (congestive heart failure) ([place]) 10/29/2016  . Bipolar disorder ([place])   . Alcohol abuse 07/09/2016  . Demand ischemia ([place]) 05/11/2016  . Chronic anticoagulation-Eliquis 05/11/2016  . Elevated troponin 05/11/2016  . Symptomatic anemia 05/11/2016  . Anemia 11/28/2015  . CVA (cerebral infarction)-Augb 2015 08/25/2014  . Stroke ([place]) 08/25/2014  . CKD (chronic kidney disease), stage III 07/17/2014  . Tobacco abuse 06/27/2014  . Pulmonary edema   . Chronic atrial fibrillation ([place])   . Edema 08/20/2011  . DM (diabetes mellitus), type 2 with renal complications ([place]) 94/58/5929  . Hyperlipidemia associated with type 2 diabetes mellitus ([place]) 03/10/2010  . Essential hypertension 03/10/2010  . MYOCARDIAL INFARCTION 03/10/2010  . CAD- last PCI 2011 03/10/2010  . PVD 03/10/2010  . GERD 03/10/2010    Current Outpatient Prescriptions:  .  acetaminophen (TYLENOL) 325 MG tablet, Take 650 mg by mouth every 8 (eight) hours as needed for mild pain or fever (pain). , Disp: , Rfl:  .  albuterol (PROVENTIL HFA;VENTOLIN HFA) 108 (90 BASE) MCG/ACT inhaler, Inhale 2 puffs into the lungs every 6 (six) hours as needed for shortness of breath., Disp: , Rfl:  .  ammonium lactate (LAC-HYDRIN) 12 % lotion, Apply 1 application topically daily., Disp: , Rfl:  .  apixaban (ELIQUIS) 5 MG TABS tablet, Take 1 tablet (5 mg total) by mouth every 12 (twelve) hours., Disp: 60 tablet, Rfl: 3 .  atorvastatin (LIPITOR) 40 MG tablet, Take 1 tablet (40 mg total) by mouth daily., Disp: 90 tablet, Rfl: 3 .  dextrose (GLUTOSE) 40 % GEL, Take 1 Tube by mouth daily as needed for low blood sugar. , Disp: , Rfl:  .  feeding supplement, GLUCERNA SHAKE, (GLUCERNA SHAKE)  LIQD, Take 237 mLs by mouth 3 (three) times daily between meals., Disp: , Rfl:  .  ferrous sulfate 325 (65 FE) MG tablet, Take 325 mg by mouth 2 (two) times daily., Disp: , Rfl:  .  finasteride (PROSCAR) 5 MG tablet, Take 5 mg by mouth daily.  , Disp: , Rfl:  .  folic acid (FOLVITE) 1 MG tablet, Take 1 tablet (1 mg total) by mouth daily., Disp: 30 tablet, Rfl: 0 .  furosemide (LASIX) 20 MG tablet, Take 1 tablet (20 mg total) by mouth 2 (two) times daily., Disp: 30 tablet, Rfl:  .  gabapentin (NEURONTIN) 300 MG capsule, Take 300 mg by mouth 2 (two) times daily. , Disp: , Rfl:  .  hydrALAZINE (APRESOLINE) 25 MG tablet, Take 1 tablet (25 mg total) by mouth 3 (three) times daily., Disp: 90 tablet, Rfl: 6 .  insulin glargine (LANTUS) 100 UNIT/ML injection, Inject 40 Units into the skin daily., Disp: , Rfl:  .  isosorbide mononitrate (IMDUR) 30 MG 24 hr tablet, Take 1 tablet (30 mg total) by mouth daily., Disp: 90 tablet, Rfl: 3 .  metoprolol succinate (TOPROL-XL) 50 MG 24 hr tablet, Take 1 tablet (50 mg total) by mouth daily., Disp: 30 tablet, Rfl: 3 .  Multiple Vitamins-Minerals (MULTIVITAMIN WITH MINERALS) tablet, Take 1 tablet by mouth daily., Disp: , Rfl:  .  nicotine (NICOTROL) 10 MG inhaler, Inhale 1 Cartridge (1 continuous puffing total) into the lungs as needed for smoking cessation.,  Disp: 42 each, Rfl: 0 .  nystatin (MYCOSTATIN) 100000 UNIT/ML suspension, Take 5 mLs (500,000 Units total) by mouth 4 (four) times daily., Disp: 240 mL, Rfl: 0 .  pantoprazole (PROTONIX) 40 MG tablet, Take 1 tablet (40 mg total) by mouth daily., Disp: 30 tablet, Rfl: 11 .  Tamsulosin HCl (FLOMAX) 0.4 MG CAPS, Take 0.4 mg by mouth daily.  , Disp: , Rfl:  .  thiamine 100 MG tablet, Take 1 tablet (100 mg total) by mouth daily., Disp: 30 tablet, Rfl: 0 .  guaifenesin (HUMIBID E) 400 MG TABS tablet, Take 400 mg by mouth 3 (three) times daily as needed. For congestion, Disp: , Rfl:  .  insulin regular (NOVOLIN R,HUMULIN  R) 100 units/mL injection, Inject 5 Units into the skin See admin instructions. Inject 5 units under the skin before breakfast and inject 5 units before evening meal, Disp: , Rfl:  .  simethicone (MYLICON) 80 MG chewable tablet, Chew 80 mg by mouth 3 (three) times daily as needed for flatulence. Take with meals as needed, Disp: , Rfl:  .  spironolactone (ALDACTONE) 25 MG tablet, Take 1 tablet (25 mg total) by mouth daily. (Patient not taking: Reported on 02/09/2017), Disp: 90 tablet, Rfl: 3 Allergies  Allergen Reactions  . Penicillins Swelling    Has patient had a PCN reaction causing immediate rash, facial/tongue/throat swelling, SOB or lightheadedness with hypotension: Yes Has patient had a PCN reaction causing severe rash involving mucus membranes or skin necrosis: No Has patient had a PCN reaction that required hospitalization: No Has patient had a PCN reaction occurring within the last 10 years: No If all of the above answers are "NO", then may proceed with Cephalosporin use.     Social History   Social History  . Marital status: Divorced    Spouse name: N/A  . Number of children: N/A  . Years of education: N/A   Occupational History  . retired Architect    Social History Main Topics  . Smoking status: Current Every Day Smoker    Packs/day: 0.50    Years: 50.00    Types: Cigarettes  . Smokeless tobacco: Never Used     Comment: He has a 50-pack-year hx of tobacco abuse. Currently, smoking about half a pack a day.  . Alcohol use Yes     Comment: Previously drank heavily, and now has a drink every other day or so.  . Drug use: Yes    Types: Marijuana, Cocaine  . Sexual activity: No   Other Topics Concern  . Not on file   Social History Narrative  . No narrative on file    Physical Exam  Constitutional: He is oriented to person, place, and time. He appears well-developed.  Pulmonary/Chest: Breath sounds normal. He has no wheezes.  Abdominal: Soft.  Musculoskeletal:  He exhibits no edema.  Neurological: He is oriented to person, place, and time.  Skin: Skin is warm and dry.  Psychiatric: He has a normal mood and affect.        Future Appointments Date Time Provider [place]  02/19/2017 2:20 PM [facility] None   BP (!) 142/68 (Patient Position: Sitting)   Pulse 92   Resp 16   Wt 185 lb 6.4 oz (84.1 kg)   SpO2 94%   BMI 25.14 kg/m  Weight yesterday-didn't weigh  Last visit weight-187.8  CBG PTA-118  Fasting   Pt reports he is feeling good today-he was eating eggs, grits, sausage and bacon  when I arrived. He states he is only eating one meal a day usually. He states he is concerned about his weight increasing if eating 2 meals a day-advised him it would keep his CBG more leveled and he also needs to be active. His goal is to start getting up to participate in the activities and meals here at his apt and to cut back on his drinking ETOH and smoking. Also advised him again about the low sodium/salt choices.  He is only taking the lantus due to his CBGs dropping while using the novolin.   He missed 2 doses of his pm meds last week.. meds verified and pill box refilled. He states the [facility] doc stopped his spiro. Will let clinic know about that.  Still smoking and drinking ETOH.   ACTION: Home visit completed Next visit planned for 1 week.   Marylouise Stacks, EMT-Paramedic

## 2017-02-16 ENCOUNTER — Other Ambulatory Visit (HOSPITAL_COMMUNITY): Payer: Self-pay

## 2017-02-16 NOTE — Progress Notes (Signed)
Paramedicine Encounter    Patient ID: [name], male    DOB: [birth year], 81 y.o.   MRN: [id number]   Patient Care Team: Beacher May, MD as PCP - General (Internal Medicine)  Patient Active Problem List   Diagnosis Date Noted  . Chronic systolic CHF (congestive heart failure) ([place]) 10/29/2016  . Bipolar disorder ([place])   . Alcohol abuse 07/09/2016  . Demand ischemia ([place]) 05/11/2016  . Chronic anticoagulation-Eliquis 05/11/2016  . Elevated troponin 05/11/2016  . Symptomatic anemia 05/11/2016  . Anemia 11/28/2015  . CVA (cerebral infarction)-Augb 2015 08/25/2014  . Stroke ([place]) 08/25/2014  . CKD (chronic kidney disease), stage III 07/17/2014  . Tobacco abuse 06/27/2014  . Pulmonary edema   . Chronic atrial fibrillation ([place])   . Edema 08/20/2011  . DM (diabetes mellitus), type 2 with renal complications ([place]) 98/92/1194  . Hyperlipidemia associated with type 2 diabetes mellitus ([place]) 03/10/2010  . Essential hypertension 03/10/2010  . MYOCARDIAL INFARCTION 03/10/2010  . CAD- last PCI 2011 03/10/2010  . PVD 03/10/2010  . GERD 03/10/2010    Current Outpatient Prescriptions:  .  acetaminophen (TYLENOL) 325 MG tablet, Take 650 mg by mouth every 8 (eight) hours as needed for mild pain or fever (pain). , Disp: , Rfl:  .  albuterol (PROVENTIL HFA;VENTOLIN HFA) 108 (90 BASE) MCG/ACT inhaler, Inhale 2 puffs into the lungs every 6 (six) hours as needed for shortness of breath., Disp: , Rfl:  .  ammonium lactate (LAC-HYDRIN) 12 % lotion, Apply 1 application topically daily., Disp: , Rfl:  .  apixaban (ELIQUIS) 5 MG TABS tablet, Take 1 tablet (5 mg total) by mouth every 12 (twelve) hours., Disp: 60 tablet, Rfl: 3 .  atorvastatin (LIPITOR) 40 MG tablet, Take 1 tablet (40 mg total) by mouth daily., Disp: 90 tablet, Rfl: 3 .  dextrose (GLUTOSE) 40 % GEL, Take 1 Tube by mouth daily as needed for low blood sugar. , Disp: , Rfl:  .  feeding supplement, GLUCERNA SHAKE, (GLUCERNA SHAKE)  LIQD, Take 237 mLs by mouth 3 (three) times daily between meals., Disp: , Rfl:  .  ferrous sulfate 325 (65 FE) MG tablet, Take 325 mg by mouth 2 (two) times daily., Disp: , Rfl:  .  finasteride (PROSCAR) 5 MG tablet, Take 5 mg by mouth daily.  , Disp: , Rfl:  .  folic acid (FOLVITE) 1 MG tablet, Take 1 tablet (1 mg total) by mouth daily., Disp: 30 tablet, Rfl: 0 .  furosemide (LASIX) 20 MG tablet, Take 1 tablet (20 mg total) by mouth 2 (two) times daily., Disp: 30 tablet, Rfl:  .  gabapentin (NEURONTIN) 300 MG capsule, Take 300 mg by mouth 2 (two) times daily. , Disp: , Rfl:  .  hydrALAZINE (APRESOLINE) 25 MG tablet, Take 1 tablet (25 mg total) by mouth 3 (three) times daily., Disp: 90 tablet, Rfl: 6 .  insulin glargine (LANTUS) 100 UNIT/ML injection, Inject 40 Units into the skin daily., Disp: , Rfl:  .  insulin regular (NOVOLIN R,HUMULIN R) 100 units/mL injection, Inject 5 Units into the skin See admin instructions. Inject 5 units under the skin before breakfast and inject 5 units before evening meal, Disp: , Rfl:  .  isosorbide mononitrate (IMDUR) 30 MG 24 hr tablet, Take 1 tablet (30 mg total) by mouth daily., Disp: 90 tablet, Rfl: 3 .  metoprolol succinate (TOPROL-XL) 50 MG 24 hr tablet, Take 1 tablet (50 mg total) by mouth daily., Disp: 30 tablet, Rfl: 3 .  Multiple Vitamins-Minerals (MULTIVITAMIN WITH MINERALS) tablet, Take 1 tablet by mouth daily., Disp: , Rfl:  .  nicotine (NICOTROL) 10 MG inhaler, Inhale 1 Cartridge (1 continuous puffing total) into the lungs as needed for smoking cessation., Disp: 42 each, Rfl: 0 .  nystatin (MYCOSTATIN) 100000 UNIT/ML suspension, Take 5 mLs (500,000 Units total) by mouth 4 (four) times daily., Disp: 240 mL, Rfl: 0 .  pantoprazole (PROTONIX) 40 MG tablet, Take 1 tablet (40 mg total) by mouth daily., Disp: 30 tablet, Rfl: 11 .  Tamsulosin HCl (FLOMAX) 0.4 MG CAPS, Take 0.4 mg by mouth Nightly. , Disp: , Rfl:  .  thiamine 100 MG tablet, Take 1 tablet (100 mg  total) by mouth daily., Disp: 30 tablet, Rfl: 0 .  guaifenesin (HUMIBID E) 400 MG TABS tablet, Take 400 mg by mouth 3 (three) times daily as needed. For congestion, Disp: , Rfl:  .  simethicone (MYLICON) 80 MG chewable tablet, Chew 80 mg by mouth 3 (three) times daily as needed for flatulence. Take with meals as needed, Disp: , Rfl:  .  spironolactone (ALDACTONE) 25 MG tablet, Take 1 tablet (25 mg total) by mouth daily. (Patient not taking: Reported on 02/09/2017), Disp: 90 tablet, Rfl: 3 Allergies  Allergen Reactions  . Penicillins Swelling    Has patient had a PCN reaction causing immediate rash, facial/tongue/throat swelling, SOB or lightheadedness with hypotension: Yes Has patient had a PCN reaction causing severe rash involving mucus membranes or skin necrosis: No Has patient had a PCN reaction that required hospitalization: No Has patient had a PCN reaction occurring within the last 10 years: No If all of the above answers are "NO", then may proceed with Cephalosporin use.     Social History   Social History  . Marital status: Divorced    Spouse name: N/A  . Number of children: N/A  . Years of education: N/A   Occupational History  . retired Architect    Social History Main Topics  . Smoking status: Current Every Day Smoker    Packs/day: 0.50    Years: 50.00    Types: Cigarettes  . Smokeless tobacco: Never Used     Comment: He has a 50-pack-year hx of tobacco abuse. Currently, smoking about half a pack a day.  . Alcohol use Yes     Comment: Previously drank heavily, and now has a drink every other day or so.  . Drug use: Yes    Types: Marijuana, Cocaine  . Sexual activity: No   Other Topics Concern  . Not on file   Social History Narrative  . No narrative on file    Physical Exam  Constitutional: He is oriented to person, place, and time.  Pulmonary/Chest: Breath sounds normal. He has no wheezes.  Abdominal: Soft.  Musculoskeletal: Normal range of motion. He  exhibits no edema.  Neurological: He is oriented to person, place, and time.  Skin: Skin is warm and dry.  Psychiatric: He has a normal mood and affect.        SAFE - 02/09/17 1300      Situation   Admitting diagnosis chf   Heart failure history Exisiting   Comorbidities HTN;CKD/renal insufficiency;CVA;DM;Hx MI/CAD;Atrial fibillation   Readmitted within 30 days No     Assessment   Lives alone Yes   Primary support person daughter/son in law   Mode of transportation family/friends   Other services involved Other  home aide   Home equipement Cane;Scale;Walker     Weight  Weighs self daily Yes   Scale provided No  has own scales-telephonic   Records on weight chart Yes     Resources   Has "Living better w/heart failure" book Yes   Has HF Zone tool Yes   Able to identify yellow zone signs/when to call MD Yes   Records zone daily No     Medications   Uses a pill box Yes   Who stocks the pill box paramedicine   Pill box checked this visit Yes   Pill box refilled this visit Yes   Difficulty obtaining medications No   Mail order medications Yes   New medications at home today Yes   Which ones ferrous sulfate   Missed one or more doses of medications per week Yes   How many missed doses this week 2     Nutrition   Patient receives meals on wheels No   Patient follows low sodium diet No   Has foods at home that meet the current recommended diet Yes   Patient follows low sugar/card diet No   Nutritional concerns/issues y     Activity Level   ADL's/Mobility Independent   How many feet can patient ambulate >44f   Typical activity level minimum    Barriers none     Urine   Difficulty urinating No   Changes in urine None     Time spent with patient   Time spent with patient  60 Minutes        Future Appointments Date Time Provider [place] 02/19/2017 2:20 PM [facility] None   ATF pt CAO x4 with no complaints today.  Pt stated that he  is trying not to gain too much weight therefore he has slowed eating and only does one meal a day. He has taken all meds-no missed doses. meds verified and pill box refilled. He will talk with doc Friday about a good weight and plan for him.    CBG-154 BP 130/68 (BP Location: Right Arm, Patient Position: Sitting, Cuff Size: Normal)   Pulse 62   Resp 16   Wt 189 lb 9.6 oz (86 kg)   SpO2 98%   BMI 25.71 kg/m  Weight yesterday-189 Last visit weight-185    ACTION: Home visit completed Next visit planned for next week   KMarylouise Stacks EMT-Paramedic 02-16-17

## 2017-02-19 ENCOUNTER — Other Ambulatory Visit (HOSPITAL_COMMUNITY): Payer: Self-pay

## 2017-02-19 ENCOUNTER — Ambulatory Visit (HOSPITAL_COMMUNITY)
Admission: RE | Admit: 2017-02-19 | Discharge: 2017-02-19 | Disposition: A | Discharge status: Home / Self Care | Payer: Medicare Other | Source: Ambulatory Visit | Attending: Cardiology | Admitting: Cardiology

## 2017-02-19 ENCOUNTER — Encounter (HOSPITAL_COMMUNITY): Payer: Self-pay

## 2017-02-19 VITALS — BP 158/78 | HR 85 | Wt 178.1 lb

## 2017-02-19 DIAGNOSIS — I2510 Atherosclerotic heart disease of native coronary artery without angina pectoris: Secondary | ICD-10-CM | POA: Diagnosis present

## 2017-02-19 DIAGNOSIS — Z8673 Personal history of transient ischemic attack (TIA), and cerebral infarction without residual deficits: Secondary | ICD-10-CM | POA: Diagnosis not present

## 2017-02-19 DIAGNOSIS — F319 Bipolar disorder, unspecified: Secondary | ICD-10-CM | POA: Diagnosis not present

## 2017-02-19 DIAGNOSIS — Z8546 Personal history of malignant neoplasm of prostate: Secondary | ICD-10-CM | POA: Diagnosis not present

## 2017-02-19 DIAGNOSIS — F17200 Nicotine dependence, unspecified, uncomplicated: Secondary | ICD-10-CM | POA: Diagnosis not present

## 2017-02-19 DIAGNOSIS — I130 Hypertensive heart and chronic kidney disease with heart failure and stage 1 through stage 4 chronic kidney disease, or unspecified chronic kidney disease: Secondary | ICD-10-CM | POA: Insufficient documentation

## 2017-02-19 DIAGNOSIS — Z794 Long term (current) use of insulin: Secondary | ICD-10-CM | POA: Diagnosis not present

## 2017-02-19 DIAGNOSIS — Z7901 Long term (current) use of anticoagulants: Secondary | ICD-10-CM | POA: Diagnosis not present

## 2017-02-19 DIAGNOSIS — I739 Peripheral vascular disease, unspecified: Secondary | ICD-10-CM | POA: Insufficient documentation

## 2017-02-19 DIAGNOSIS — Z79899 Other long term (current) drug therapy: Secondary | ICD-10-CM | POA: Diagnosis not present

## 2017-02-19 DIAGNOSIS — I482 Chronic atrial fibrillation, unspecified: Secondary | ICD-10-CM

## 2017-02-19 DIAGNOSIS — E1122 Type 2 diabetes mellitus with diabetic chronic kidney disease: Secondary | ICD-10-CM | POA: Diagnosis not present

## 2017-02-19 DIAGNOSIS — I5022 Chronic systolic (congestive) heart failure: Secondary | ICD-10-CM | POA: Diagnosis not present

## 2017-02-19 DIAGNOSIS — N183 Chronic kidney disease, stage 3 unspecified: Secondary | ICD-10-CM

## 2017-02-19 DIAGNOSIS — Z955 Presence of coronary angioplasty implant and graft: Secondary | ICD-10-CM | POA: Diagnosis not present

## 2017-02-19 DIAGNOSIS — F1010 Alcohol abuse, uncomplicated: Secondary | ICD-10-CM | POA: Insufficient documentation

## 2017-02-19 MED ORDER — HYDRALAZINE HCL 25 MG PO TABS: 37.5000 mg | ORAL_TABLET | Freq: Three times a day (TID) | ORAL | 6 refills | Status: DC

## 2017-02-19 MED ORDER — SPIRONOLACTONE 25 MG PO TABS: 12.5000 mg | ORAL_TABLET | Freq: Every day | ORAL | 3 refills | Status: AC

## 2017-02-19 NOTE — Patient Instructions (Signed)
Decrease Spironolactone to 12.5 mg (0.5 Tablet) Once Daily  Increase Hydralazine to 37.5 mg (1.5 Tablet) Three Times Daily  Labs in 2 weeks  Follow up in 2 Months

## 2017-02-19 NOTE — Progress Notes (Signed)
Paramedicine Encounter    Patient ID: [name], male    DOB: November 18, 1936, 81 y.o.   MRN: [id number]   Patient Care Team: Beacher May, MD as PCP - General (Internal Medicine)  Patient Active Problem List   Diagnosis Date Noted  . Chronic systolic CHF (congestive heart failure) ([place]) 10/29/2016  . Bipolar disorder ([place])   . Alcohol abuse 07/09/2016  . Demand ischemia ([place]) 05/11/2016  . Chronic anticoagulation-Eliquis 05/11/2016  . Elevated troponin 05/11/2016  . Symptomatic anemia 05/11/2016  . Anemia 11/28/2015  . CVA (cerebral infarction)-Augb 2015 08/25/2014  . Stroke ([place]) 08/25/2014  . CKD (chronic kidney disease), stage III 07/17/2014  . Tobacco abuse 06/27/2014  . Pulmonary edema   . Chronic atrial fibrillation ([place])   . Edema 08/20/2011  . DM (diabetes mellitus), type 2 with renal complications ([place]) 49/67/5916  . Hyperlipidemia associated with type 2 diabetes mellitus ([place]) 03/10/2010  . Essential hypertension 03/10/2010  . MYOCARDIAL INFARCTION 03/10/2010  . CAD- last PCI 2011 03/10/2010  . PVD 03/10/2010  . GERD 03/10/2010    Current Outpatient Prescriptions:  .  acetaminophen (TYLENOL) 325 MG tablet, Take 650 mg by mouth every 8 (eight) hours as needed for mild pain or fever (pain). , Disp: , Rfl:  .  albuterol (PROVENTIL HFA;VENTOLIN HFA) 108 (90 BASE) MCG/ACT inhaler, Inhale 2 puffs into the lungs every 6 (six) hours as needed for shortness of breath., Disp: , Rfl:  .  ammonium lactate (LAC-HYDRIN) 12 % lotion, Apply 1 application topically daily., Disp: , Rfl:  .  apixaban (ELIQUIS) 5 MG TABS tablet, Take 1 tablet (5 mg total) by mouth every 12 (twelve) hours., Disp: 60 tablet, Rfl: 3 .  atorvastatin (LIPITOR) 40 MG tablet, Take 1 tablet (40 mg total) by mouth daily., Disp: 90 tablet, Rfl: 3 .  dextrose (GLUTOSE) 40 % GEL, Take 1 Tube by mouth daily as needed for low blood sugar. , Disp: , Rfl:  .  feeding supplement, GLUCERNA SHAKE, (GLUCERNA SHAKE)  LIQD, Take 237 mLs by mouth 3 (three) times daily between meals., Disp: , Rfl:  .  ferrous sulfate 325 (65 FE) MG tablet, Take 325 mg by mouth 2 (two) times daily., Disp: , Rfl:  .  finasteride (PROSCAR) 5 MG tablet, Take 5 mg by mouth daily.  , Disp: , Rfl:  .  folic acid (FOLVITE) 1 MG tablet, Take 1 tablet (1 mg total) by mouth daily., Disp: 30 tablet, Rfl: 0 .  furosemide (LASIX) 20 MG tablet, Take 1 tablet (20 mg total) by mouth 2 (two) times daily., Disp: 30 tablet, Rfl:  .  gabapentin (NEURONTIN) 300 MG capsule, Take 300 mg by mouth 2 (two) times daily. , Disp: , Rfl:  .  guaifenesin (HUMIBID E) 400 MG TABS tablet, Take 400 mg by mouth 3 (three) times daily as needed. For congestion, Disp: , Rfl:  .  hydrALAZINE (APRESOLINE) 25 MG tablet, Take 1 tablet (25 mg total) by mouth 3 (three) times daily., Disp: 90 tablet, Rfl: 6 .  insulin glargine (LANTUS) 100 UNIT/ML injection, Inject 40 Units into the skin daily., Disp: , Rfl:  .  insulin regular (NOVOLIN R,HUMULIN R) 100 units/mL injection, Inject 5 Units into the skin See admin instructions. Inject 5 units under the skin before breakfast and inject 5 units before evening meal, Disp: , Rfl:  .  isosorbide mononitrate (IMDUR) 30 MG 24 hr tablet, Take 1 tablet (30 mg total) by mouth daily., Disp: 90 tablet, Rfl: 3 .  metoprolol succinate (TOPROL-XL) 50 MG 24 hr tablet, Take 1 tablet (50 mg total) by mouth daily., Disp: 30 tablet, Rfl: 3 .  Multiple Vitamins-Minerals (MULTIVITAMIN WITH MINERALS) tablet, Take 1 tablet by mouth daily., Disp: , Rfl:  .  nicotine (NICOTROL) 10 MG inhaler, Inhale 1 Cartridge (1 continuous puffing total) into the lungs as needed for smoking cessation., Disp: 42 each, Rfl: 0 .  nystatin (MYCOSTATIN) 100000 UNIT/ML suspension, Take 5 mLs (500,000 Units total) by mouth 4 (four) times daily., Disp: 240 mL, Rfl: 0 .  pantoprazole (PROTONIX) 40 MG tablet, Take 1 tablet (40 mg total) by mouth daily., Disp: 30 tablet, Rfl: 11 .   simethicone (MYLICON) 80 MG chewable tablet, Chew 80 mg by mouth 3 (three) times daily as needed for flatulence. Take with meals as needed, Disp: , Rfl:  .  spironolactone (ALDACTONE) 25 MG tablet, Take 1 tablet (25 mg total) by mouth daily. (Patient not taking: Reported on 02/09/2017), Disp: 90 tablet, Rfl: 3 .  Tamsulosin HCl (FLOMAX) 0.4 MG CAPS, Take 0.4 mg by mouth Nightly. , Disp: , Rfl:  .  thiamine 100 MG tablet, Take 1 tablet (100 mg total) by mouth daily., Disp: 30 tablet, Rfl: 0 Allergies  Allergen Reactions  . Penicillins Swelling    Has patient had a PCN reaction causing immediate rash, facial/tongue/throat swelling, SOB or lightheadedness with hypotension: Yes Has patient had a PCN reaction causing severe rash involving mucus membranes or skin necrosis: No Has patient had a PCN reaction that required hospitalization: No Has patient had a PCN reaction occurring within the last 10 years: No If all of the above answers are "NO", then may proceed with Cephalosporin use.     Social History   Social History  . Marital status: Divorced    Spouse name: N/A  . Number of children: N/A  . Years of education: N/A   Occupational History  . retired Architect    Social History Main Topics  . Smoking status: Current Every Day Smoker    Packs/day: 0.50    Years: 50.00    Types: Cigarettes  . Smokeless tobacco: Never Used     Comment: He has a 50-pack-year hx of tobacco abuse. Currently, smoking about half a pack a day.  . Alcohol use Yes     Comment: Previously drank heavily, and now has a drink every other day or so.  . Drug use: Yes    Types: Marijuana, Cocaine  . Sexual activity: No   Other Topics Concern  . Not on file   Social History Narrative  . No narrative on file    Physical Exam  Eyes: Pupils are equal, round, and reactive to light.  Pulmonary/Chest: No respiratory distress. He has no wheezes. He has no rales.  Abdominal: He exhibits no distension. There is  no tenderness. There is no rebound and no guarding.  Musculoskeletal: He exhibits no edema.  Skin: Skin is warm and dry. He is not diaphoretic.        SAFE - 02/09/17 1300      Situation   Admitting diagnosis chf   Heart failure history Exisiting   Comorbidities HTN;CKD/renal insufficiency;CVA;DM;Hx MI/CAD;Atrial fibillation   Readmitted within 30 days No     Assessment   Lives alone Yes   Primary support person daughter/son in law   Mode of transportation family/friends   Other services involved Other  home aide   Home equipement Cane;Scale;Walker     Weight   Weighs  self daily Yes   Scale provided No  has own scales-telephonic   Records on weight chart Yes     Resources   Has "Living better w/heart failure" book Yes   Has HF Zone tool Yes   Able to identify yellow zone signs/when to call MD Yes   Records zone daily No     Medications   Uses a pill box Yes   Who stocks the pill box paramedicine   Pill box checked this visit Yes   Pill box refilled this visit Yes   Difficulty obtaining medications No   Mail order medications Yes   New medications at home today Yes   Which ones ferrous sulfate   Missed one or more doses of medications per week Yes   How many missed doses this week 2     Nutrition   Patient receives meals on wheels No   Patient follows low sodium diet No   Has foods at home that meet the current recommended diet Yes   Patient follows low sugar/card diet No   Nutritional concerns/issues y     Activity Level   ADL's/Mobility Independent   How many feet can patient ambulate >23f   Typical activity level minimum    Barriers none     Urine   Difficulty urinating No   Changes in urine None     Time spent with patient   Time spent with patient  656Minutes     clinic visit  ATF pt CAO x4 with no complaints at this time.  Pt denies sob, headache, dizziness, and chest pain. Pt stated that he has been taking his medications without difficulty.   Pt did not bring his medications to today's office visit.   Dr. MAundra Dubin Saw [place]'s [place] [facility] [place] [facility] and stated that pt can "go back on the spirolactone". Come back in two weeks for blood work. ** spirolactone 12.5 (1/2 tab) *Increased hydralazine 1.5 tablet    No future appointments.  BP (!) 156/70 (BP Location: Right Arm, Patient Position: Sitting, Cuff Size: Normal)   Pulse 85   Resp 16   Wt 187 lb 9.6 oz (85.1 kg)   BMI 25.44 kg/m      ACTION: Home visit completed

## 2017-02-21 NOTE — Progress Notes (Signed)
PCP: Dr. Koleen Nimrod Cardiology: Dr. Aundra Dubin  81 yo with history of HTN, CKD stage III, type II diabetes, prior CVA, CKD stage III, CAD, ischemic cardiomyopathy, PAD, chronic atrial fibrillation, and ETOH abuse presents for cardiology evaluation.  He had a LCx stent in 2011 and has a known occluded RCA.  He has been in chronic atrial fibrillation.  Prior history of GI bleeding with ablation of small bowel AVMs in 5/17. Most recent echo in 5/17 with EF 25-30%.  He lives in an independent living facility.  He was admitted in 7/17 with acute on chronic systolic CHF and diuresed.    Myoview 10/27/16 with EF 19% with large severe defect in the basal inferior, basal inferolateral, mid inferior and mid inferolateral location consistent with previous MI.    Weight is up 3 lbs since last appointment. He is still drinking fairly heavily and smoking about 1/2 ppd.  He is drying to cut back on drinking.  No chest pain.  He walks with a cane for balance.  No dyspnea on flat ground, able to walk around his building without problems.  No orthopnea/PND.  Paramedicine is seeing him.     Labs (7/17): K 4.2, creatinine 2.62 Labs (08/12/2016) K 4.2, creatinine 2.25.  Labs (9/17): K 4.2, creatinine 1.45, hgb 12.2 Labs (10/17): LDL 44 Labs (11/17): K 3.6, creatinine 1.97 Labs (12/17): hgb 13.3 Labs (2/18): K 3.5, creatinine 2  PMH: 1. HTN 2. Type II diabetes 3. H/o CVA 4. H/o GI bleeding: Small bowel AVM ablation in 5/17.  5. Prostate cancer 6. Bipolar disorder 7. ETOH abuse 8. PAD 9. CAD: DES to LCx in 2011, RCA noted to be chronically totally occluded.  - Myoview 10/27/16 with EF 19% with large severe defect in the basal inferior, basal inferolateral, mid inferior and mid inferolateral location consistent with previous MI.   10. Chronic systolic CHF: Suspect ischemic cardiomyopathy but may have contribution from ETOH abuse.   - Echo (8/15): EF 40-45%, mildly dilated RV. - Echo (5/17): EF 25-30%, normal RV size  and systolic function.  11. CKD: Stage III.  12. Chronic atrial fibrillation  SH: Lives alone in an independent living facility.  1/2 gallon liquor/week.  Active smoker.    Family History  Problem Relation Age of Onset  . Cancer Father   . Cancer Mother   . Cancer Sister    ROS: All systems reviewed and negative except as per HPI.   Current Outpatient Prescriptions  Medication Sig Dispense Refill  . acetaminophen (TYLENOL) 325 MG tablet Take 650 mg by mouth every 8 (eight) hours as needed for mild pain or fever (pain).     Marland Kitchen albuterol (PROVENTIL HFA;VENTOLIN HFA) 108 (90 BASE) MCG/ACT inhaler Inhale 2 puffs into the lungs every 6 (six) hours as needed for shortness of breath.    Marland Kitchen ammonium lactate (LAC-HYDRIN) 12 % lotion Apply 1 application topically daily.    Marland Kitchen apixaban (ELIQUIS) 5 MG TABS tablet Take 1 tablet (5 mg total) by mouth every 12 (twelve) hours. 60 tablet 3  . atorvastatin (LIPITOR) 40 MG tablet Take 1 tablet (40 mg total) by mouth daily. 90 tablet 3  . dextrose (GLUTOSE) 40 % GEL Take 1 Tube by mouth daily as needed for low blood sugar.     . feeding supplement, GLUCERNA SHAKE, (GLUCERNA SHAKE) LIQD Take 237 mLs by mouth 3 (three) times daily between meals.    . ferrous sulfate 325 (65 FE) MG tablet Take 325 mg by mouth 2 (  two) times daily.    . finasteride (PROSCAR) 5 MG tablet Take 5 mg by mouth daily.      . folic acid (FOLVITE) 1 MG tablet Take 1 tablet (1 mg total) by mouth daily. 30 tablet 0  . furosemide (LASIX) 20 MG tablet Take 1 tablet (20 mg total) by mouth 2 (two) times daily. 30 tablet   . gabapentin (NEURONTIN) 300 MG capsule Take 300 mg by mouth 2 (two) times daily.     Marland Kitchen guaifenesin (HUMIBID E) 400 MG TABS tablet Take 400 mg by mouth 3 (three) times daily as needed. For congestion    . hydrALAZINE (APRESOLINE) 25 MG tablet Take 1.5 tablets (37.5 mg total) by mouth 3 (three) times daily. 90 tablet 6  . insulin glargine (LANTUS) 100 UNIT/ML injection Inject  40 Units into the skin daily.    . insulin regular (NOVOLIN R,HUMULIN R) 100 units/mL injection Inject 5 Units into the skin See admin instructions. Inject 5 units under the skin before breakfast and inject 5 units before evening meal    . isosorbide mononitrate (IMDUR) 30 MG 24 hr tablet Take 1 tablet (30 mg total) by mouth daily. 90 tablet 3  . metoprolol succinate (TOPROL-XL) 50 MG 24 hr tablet Take 1 tablet (50 mg total) by mouth daily. 30 tablet 3  . Multiple Vitamins-Minerals (MULTIVITAMIN WITH MINERALS) tablet Take 1 tablet by mouth daily.    . nicotine (NICOTROL) 10 MG inhaler Inhale 1 Cartridge (1 continuous puffing total) into the lungs as needed for smoking cessation. 42 each 0  . nystatin (MYCOSTATIN) 100000 UNIT/ML suspension Take 5 mLs (500,000 Units total) by mouth 4 (four) times daily. 240 mL 0  . pantoprazole (PROTONIX) 40 MG tablet Take 1 tablet (40 mg total) by mouth daily. 30 tablet 11  . simethicone (MYLICON) 80 MG chewable tablet Chew 80 mg by mouth 3 (three) times daily as needed for flatulence. Take with meals as needed    . Tamsulosin HCl (FLOMAX) 0.4 MG CAPS Take 0.4 mg by mouth Nightly.     . thiamine 100 MG tablet Take 1 tablet (100 mg total) by mouth daily. 30 tablet 0  . spironolactone (ALDACTONE) 25 MG tablet Take 0.5 tablets (12.5 mg total) by mouth daily. 45 tablet 3   No current facility-administered medications for this encounter.    BP (!) 158/78 (BP Location: Left Arm, Patient Position: Sitting, Cuff Size: Normal)   Pulse 85   Wt 178 lb 1.9 oz (80.8 kg)   SpO2 96%   BMI 24.16 kg/m    Wt Readings from Last 3 Encounters:  02/19/17 178 lb 1.9 oz (80.8 kg)  02/19/17 187 lb 9.6 oz (85.1 kg)  02/16/17 189 lb 9.6 oz (86 kg)     General: NAD Neck:  JVP 7 cm, No thyromegaly or nodule noted.   Lungs: Slight crackles at bases bilaterally.  CV: Nondisplaced PMI.  Heart irregular S1/S2, no S3/S4, no murmur.  No edema.  No carotid bruit.  Normal pedal pulses.   Abdomen: Soft, nontender, no hepatosplenomegaly, no distention.  Skin: Intact without lesions or rashes.  Neurologic: Alert and oriented x 3.  Psych: Normal affect. Extremities: No clubbing or cyanosis.  HEENT: Normal.   Assessment/Plan: 1. CAD: No chest pain.  Cardiolite 10/17 with infarct, no ischemia.  He is not on ASA given Eliquis use.  He is on atorvastatin.   2. Chronic systolic CHF: EF 16-10% on 5/17 echo, which is down from 40-45% in 8/15.  Suspect ischemic cardiomyopathy with possible contribution from ETOH.  He continues to drink fairly heavily but is trying to cut back.  He is not volume overloaded on exam.  NYHA class II-III. - Continue Lasix 20 mg daily.    - Increase hydralazine to 37.5 mg tid and continue Imdur 30 mg daily.   - Continue Toprol XL 50 daily.  - Add back spironolactone 12.5 mg daily. Check BMET in 2 wks.  - Hold off on ACEI/ARB/ARNI for now with elevated creatinine. 3. PAD: No claudication reported.  - No cilostazol given CHF.  4. CKD: Stage III.    5. ETOH abuse: We discussed cutting back.  I am concerned it has contributed to his cardiomyopathy.  6. Smoking: He is trying to cut back using nicotine inhaler.   7. Atrial fibrillation: Chronic. No recent GI bleeding.   - Eliquis 5 mg bid.   Followup in 2 months.   Loralie Champagne,  02/21/2017

## 2017-02-23 ENCOUNTER — Other Ambulatory Visit (HOSPITAL_COMMUNITY): Payer: Self-pay

## 2017-02-23 NOTE — Progress Notes (Signed)
Paramedicine Encounter    Patient ID: [name], male    DOB: 03-18-1936, 81 y.o.   MRN: [id number]  Patient Care Team: Beacher May, MD as PCP - General (Internal Medicine)  Patient Active Problem List   Diagnosis Date Noted  . Chronic systolic CHF (congestive heart failure) ([place]) 10/29/2016  . Bipolar disorder ([place])   . Alcohol abuse 07/09/2016  . Demand ischemia ([place]) 05/11/2016  . Chronic anticoagulation-Eliquis 05/11/2016  . Elevated troponin 05/11/2016  . Symptomatic anemia 05/11/2016  . Anemia 11/28/2015  . CVA (cerebral infarction)-Augb 2015 08/25/2014  . Stroke ([place]) 08/25/2014  . CKD (chronic kidney disease), stage III 07/17/2014  . Tobacco abuse 06/27/2014  . Pulmonary edema   . Chronic atrial fibrillation ([place])   . Edema 08/20/2011  . DM (diabetes mellitus), type 2 with renal complications ([place]) 78/29/5621  . Hyperlipidemia associated with type 2 diabetes mellitus ([place]) 03/10/2010  . Essential hypertension 03/10/2010  . MYOCARDIAL INFARCTION 03/10/2010  . CAD- last PCI 2011 03/10/2010  . PVD 03/10/2010  . GERD 03/10/2010    Current Outpatient Prescriptions:  .  acetaminophen (TYLENOL) 325 MG tablet, Take 650 mg by mouth every 8 (eight) hours as needed for mild pain or fever (pain). , Disp: , Rfl:  .  albuterol (PROVENTIL HFA;VENTOLIN HFA) 108 (90 BASE) MCG/ACT inhaler, Inhale 2 puffs into the lungs every 6 (six) hours as needed for shortness of breath., Disp: , Rfl:  .  ammonium lactate (LAC-HYDRIN) 12 % lotion, Apply 1 application topically daily., Disp: , Rfl:  .  apixaban (ELIQUIS) 5 MG TABS tablet, Take 1 tablet (5 mg total) by mouth every 12 (twelve) hours., Disp: 60 tablet, Rfl: 3 .  atorvastatin (LIPITOR) 40 MG tablet, Take 1 tablet (40 mg total) by mouth daily., Disp: 90 tablet, Rfl: 3 .  dextrose (GLUTOSE) 40 % GEL, Take 1 Tube by mouth daily as needed for low blood sugar. , Disp: , Rfl:  .  feeding supplement, GLUCERNA SHAKE, (GLUCERNA SHAKE)  LIQD, Take 237 mLs by mouth 3 (three) times daily between meals., Disp: , Rfl:  .  ferrous sulfate 325 (65 FE) MG tablet, Take 325 mg by mouth 2 (two) times daily., Disp: , Rfl:  .  finasteride (PROSCAR) 5 MG tablet, Take 5 mg by mouth daily.  , Disp: , Rfl:  .  folic acid (FOLVITE) 1 MG tablet, Take 1 tablet (1 mg total) by mouth daily., Disp: 30 tablet, Rfl: 0 .  furosemide (LASIX) 20 MG tablet, Take 1 tablet (20 mg total) by mouth 2 (two) times daily., Disp: 30 tablet, Rfl:  .  gabapentin (NEURONTIN) 300 MG capsule, Take 300 mg by mouth 2 (two) times daily. , Disp: , Rfl:  .  hydrALAZINE (APRESOLINE) 25 MG tablet, Take 1.5 tablets (37.5 mg total) by mouth 3 (three) times daily., Disp: 90 tablet, Rfl: 6 .  insulin glargine (LANTUS) 100 UNIT/ML injection, Inject 40 Units into the skin daily., Disp: , Rfl:  .  isosorbide mononitrate (IMDUR) 30 MG 24 hr tablet, Take 1 tablet (30 mg total) by mouth daily., Disp: 90 tablet, Rfl: 3 .  metoprolol succinate (TOPROL-XL) 50 MG 24 hr tablet, Take 1 tablet (50 mg total) by mouth daily., Disp: 30 tablet, Rfl: 3 .  Multiple Vitamins-Minerals (MULTIVITAMIN WITH MINERALS) tablet, Take 1 tablet by mouth daily., Disp: , Rfl:  .  nicotine (NICOTROL) 10 MG inhaler, Inhale 1 Cartridge (1 continuous puffing total) into the lungs as needed for smoking cessation., Disp:  42 each, Rfl: 0 .  pantoprazole (PROTONIX) 40 MG tablet, Take 1 tablet (40 mg total) by mouth daily., Disp: 30 tablet, Rfl: 11 .  spironolactone (ALDACTONE) 25 MG tablet, Take 0.5 tablets (12.5 mg total) by mouth daily., Disp: 45 tablet, Rfl: 3 .  Tamsulosin HCl (FLOMAX) 0.4 MG CAPS, Take 0.4 mg by mouth Nightly. , Disp: , Rfl:  .  thiamine 100 MG tablet, Take 1 tablet (100 mg total) by mouth daily., Disp: 30 tablet, Rfl: 0 .  guaifenesin (HUMIBID E) 400 MG TABS tablet, Take 400 mg by mouth 3 (three) times daily as needed. For congestion, Disp: , Rfl:  .  insulin regular (NOVOLIN R,HUMULIN R) 100 units/mL  injection, Inject 5 Units into the skin See admin instructions. Inject 5 units under the skin before breakfast and inject 5 units before evening meal, Disp: , Rfl:  .  nystatin (MYCOSTATIN) 100000 UNIT/ML suspension, Take 5 mLs (500,000 Units total) by mouth 4 (four) times daily., Disp: 240 mL, Rfl: 0 .  simethicone (MYLICON) 80 MG chewable tablet, Chew 80 mg by mouth 3 (three) times daily as needed for flatulence. Take with meals as needed, Disp: , Rfl:  Allergies  Allergen Reactions  . Penicillins Swelling    Has patient had a PCN reaction causing immediate rash, facial/tongue/throat swelling, SOB or lightheadedness with hypotension: Yes Has patient had a PCN reaction causing severe rash involving mucus membranes or skin necrosis: No Has patient had a PCN reaction that required hospitalization: No Has patient had a PCN reaction occurring within the last 10 years: No If all of the above answers are "NO", then may proceed with Cephalosporin use.     Social History   Social History  . Marital status: Divorced    Spouse name: N/A  . Number of children: N/A  . Years of education: N/A   Occupational History  . retired Architect    Social History Main Topics  . Smoking status: Current Every Day Smoker    Packs/day: 0.50    Years: 50.00    Types: Cigarettes  . Smokeless tobacco: Never Used     Comment: He has a 50-pack-year hx of tobacco abuse. Currently, smoking about half a pack a day.  . Alcohol use Yes     Comment: Previously drank heavily, and now has a drink every other day or so.  . Drug use: Yes    Types: Marijuana, Cocaine  . Sexual activity: No   Other Topics Concern  . Not on file   Social History Narrative  . No narrative on file    Physical Exam  Eyes: Pupils are equal, round, and reactive to light.  Pulmonary/Chest: He has no wheezes. He has no rales.  Abdominal: Soft. He exhibits no distension. There is no tenderness. There is no rebound and no guarding.   Musculoskeletal: He exhibits no edema.  Skin: Skin is warm and dry. He is not diaphoretic.        SAFE - 02/09/17 1300      Situation   Admitting diagnosis chf   Heart failure history Exisiting   Comorbidities HTN;CKD/renal insufficiency;CVA;DM;Hx MI/CAD;Atrial fibillation   Readmitted within 30 days No     Assessment   Lives alone Yes   Primary support person daughter/son in law   Mode of transportation family/friends   Other services involved Other  home aide   Home equipement Cane;Scale;Walker     Weight   Weighs self daily Yes   Scale provided  No  has own scales-telephonic   Records on weight chart Yes     Resources   Has "Living better w/heart failure" book Yes   Has HF Zone tool Yes   Able to identify yellow zone signs/when to call MD Yes   Records zone daily No     Medications   Uses a pill box Yes   Who stocks the pill box paramedicine   Pill box checked this visit Yes   Pill box refilled this visit Yes   Difficulty obtaining medications No   Mail order medications Yes   New medications at home today Yes   Which ones ferrous sulfate   Missed one or more doses of medications per week Yes   How many missed doses this week 2     Nutrition   Patient receives meals on wheels No   Patient follows low sodium diet No   Has foods at home that meet the current recommended diet Yes   Patient follows low sugar/card diet No   Nutritional concerns/issues y     Activity Level   ADL's/Mobility Independent   How many feet can patient ambulate >101f   Typical activity level minimum    Barriers none     Urine   Difficulty urinating No   Changes in urine None     Time spent with patient   Time spent with patient  60 Minutes        Future Appointments Date Time Provider DAngus 03/05/2017 2:15 PM [facility] LAB [facility] None  04/20/2017 2:40 PM [facility] CLINIC [facility] None   ATF pt CAO x4 with no complaints.Pt has some edema in his left foot (which  he states is his normal). [facility] to stop taking the spirolactone, but pt told him that Dr. MAundra Dubinadvised him to take the spirolactone and he has blood work scheduled.  The PA advised pt that it would be ok to continue the rx, due to heart clinic re-checking lab.  Pt stated that he is trying to stop drinking alcohol and it was very hard to stop smoking and drinking at once.  Pt was encouraged to continue trying. rx bottles and pill box verified.   BP 108/60 (BP Location: Right Arm, Patient Position: Sitting, Cuff Size: Normal)   Pulse 78   Resp 14   Wt 187 lb (84.8 kg)   SpO2 96%   BMI 25.36 kg/m   cbg 107  Weight yesterday-didn't weigh yesterday Last visit weight-187    ACTION: Home visit completed

## 2017-03-02 ENCOUNTER — Other Ambulatory Visit (HOSPITAL_COMMUNITY): Payer: Self-pay

## 2017-03-02 NOTE — Progress Notes (Signed)
Paramedicine Encounter    Patient ID: YANDIEL BERGUM, male    DOB: 21-Aug-1936, 81 y.o.   MRN: [id number]   Patient Care Team: Beacher May, MD as PCP - General (Internal Medicine)  Patient Active Problem List   Diagnosis Date Noted  . Chronic systolic CHF (congestive heart failure) ([place]) 10/29/2016  . Bipolar disorder ([place])   . Alcohol abuse 07/09/2016  . Demand ischemia ([place]) 05/11/2016  . Chronic anticoagulation-Eliquis 05/11/2016  . Elevated troponin 05/11/2016  . Symptomatic anemia 05/11/2016  . Anemia 11/28/2015  . CVA (cerebral infarction)-Augb 2015 08/25/2014  . Stroke ([place]) 08/25/2014  . CKD (chronic kidney disease), stage III 07/17/2014  . Tobacco abuse 06/27/2014  . Pulmonary edema   . Chronic atrial fibrillation ([place])   . Edema 08/20/2011  . DM (diabetes mellitus), type 2 with renal complications ([place]) 09/38/1829  . Hyperlipidemia associated with type 2 diabetes mellitus ([place]) 03/10/2010  . Essential hypertension 03/10/2010  . MYOCARDIAL INFARCTION 03/10/2010  . CAD- last PCI 2011 03/10/2010  . PVD 03/10/2010  . GERD 03/10/2010    Current Outpatient Prescriptions:  .  acetaminophen (TYLENOL) 325 MG tablet, Take 650 mg by mouth every 8 (eight) hours as needed for mild pain or fever (pain). , Disp: , Rfl:  .  albuterol (PROVENTIL HFA;VENTOLIN HFA) 108 (90 BASE) MCG/ACT inhaler, Inhale 2 puffs into the lungs every 6 (six) hours as needed for shortness of breath., Disp: , Rfl:  .  ammonium lactate (LAC-HYDRIN) 12 % lotion, Apply 1 application topically daily., Disp: , Rfl:  .  apixaban (ELIQUIS) 5 MG TABS tablet, Take 1 tablet (5 mg total) by mouth every 12 (twelve) hours., Disp: 60 tablet, Rfl: 3 .  atorvastatin (LIPITOR) 40 MG tablet, Take 1 tablet (40 mg total) by mouth daily. (Patient taking differently: Take 40 mg by mouth at bedtime. ), Disp: 90 tablet, Rfl: 3 .  dextrose (GLUTOSE) 40 % GEL, Take 1 Tube by mouth daily as needed for low blood sugar. , Disp: ,  Rfl:  .  feeding supplement, GLUCERNA SHAKE, (GLUCERNA SHAKE) LIQD, Take 237 mLs by mouth 3 (three) times daily between meals., Disp: , Rfl:  .  ferrous sulfate 325 (65 FE) MG tablet, Take 325 mg by mouth 2 (two) times daily., Disp: , Rfl:  .  finasteride (PROSCAR) 5 MG tablet, Take 5 mg by mouth daily.  , Disp: , Rfl:  .  folic acid (FOLVITE) 1 MG tablet, Take 1 tablet (1 mg total) by mouth daily., Disp: 30 tablet, Rfl: 0 .  furosemide (LASIX) 20 MG tablet, Take 1 tablet (20 mg total) by mouth 2 (two) times daily., Disp: 30 tablet, Rfl:  .  gabapentin (NEURONTIN) 300 MG capsule, Take 300 mg by mouth 2 (two) times daily. , Disp: , Rfl:  .  hydrALAZINE (APRESOLINE) 25 MG tablet, Take 1.5 tablets (37.5 mg total) by mouth 3 (three) times daily., Disp: 90 tablet, Rfl: 6 .  insulin glargine (LANTUS) 100 UNIT/ML injection, Inject 40 Units into the skin daily., Disp: , Rfl:  .  insulin regular (NOVOLIN R,HUMULIN R) 100 units/mL injection, Inject 5 Units into the skin See admin instructions. Inject 5 units under the skin before breakfast and inject 5 units before evening meal, Disp: , Rfl:  .  isosorbide mononitrate (IMDUR) 30 MG 24 hr tablet, Take 1 tablet (30 mg total) by mouth daily., Disp: 90 tablet, Rfl: 3 .  metoprolol succinate (TOPROL-XL) 50 MG 24 hr tablet, Take 1 tablet (50 mg  total) by mouth daily., Disp: 30 tablet, Rfl: 3 .  Multiple Vitamins-Minerals (MULTIVITAMIN WITH MINERALS) tablet, Take 1 tablet by mouth daily., Disp: , Rfl:  .  nicotine (NICOTROL) 10 MG inhaler, Inhale 1 Cartridge (1 continuous puffing total) into the lungs as needed for smoking cessation., Disp: 42 each, Rfl: 0 .  nystatin (MYCOSTATIN) 100000 UNIT/ML suspension, Take 5 mLs (500,000 Units total) by mouth 4 (four) times daily., Disp: 240 mL, Rfl: 0 .  pantoprazole (PROTONIX) 40 MG tablet, Take 1 tablet (40 mg total) by mouth daily., Disp: 30 tablet, Rfl: 11 .  simethicone (MYLICON) 80 MG chewable tablet, Chew 80 mg by mouth  3 (three) times daily as needed for flatulence. Take with meals as needed, Disp: , Rfl:  .  spironolactone (ALDACTONE) 25 MG tablet, Take 0.5 tablets (12.5 mg total) by mouth daily., Disp: 45 tablet, Rfl: 3 .  Tamsulosin HCl (FLOMAX) 0.4 MG CAPS, Take 0.4 mg by mouth Nightly. , Disp: , Rfl:  .  thiamine 100 MG tablet, Take 1 tablet (100 mg total) by mouth daily., Disp: 30 tablet, Rfl: 0 .  guaifenesin (HUMIBID E) 400 MG TABS tablet, Take 400 mg by mouth 3 (three) times daily as needed. For congestion, Disp: , Rfl:  Allergies  Allergen Reactions  . Penicillins Swelling    Has patient had a PCN reaction causing immediate rash, facial/tongue/throat swelling, SOB or lightheadedness with hypotension: Yes Has patient had a PCN reaction causing severe rash involving mucus membranes or skin necrosis: No Has patient had a PCN reaction that required hospitalization: No Has patient had a PCN reaction occurring within the last 10 years: No If all of the above answers are "NO", then may proceed with Cephalosporin use.     Social History   Social History  . Marital status: Divorced    Spouse name: N/A  . Number of children: N/A  . Years of education: N/A   Occupational History  . retired Architect    Social History Main Topics  . Smoking status: Current Every Day Smoker    Packs/day: 0.50    Years: 50.00    Types: Cigarettes  . Smokeless tobacco: Never Used     Comment: He has a 50-pack-year hx of tobacco abuse. Currently, smoking about half a pack a day.  . Alcohol use Yes     Comment: Previously drank heavily, and now has a drink every other day or so.  . Drug use: Yes    Types: Marijuana, Cocaine  . Sexual activity: No   Other Topics Concern  . Not on file   Social History Narrative  . No narrative on file    Physical Exam  Constitutional: He is oriented to person, place, and time. He appears well-developed.  Neck: Normal range of motion.  Cardiovascular: Normal rate.    Pulmonary/Chest: Effort normal and breath sounds normal.  Abdominal: Soft.  Musculoskeletal: Normal range of motion. He exhibits no edema.  Neurological: He is oriented to person, place, and time.  Skin: Skin is warm and dry.  Psychiatric: He has a normal mood and affect.        SAFE - 02/09/17 1300      Situation   Admitting diagnosis chf   Heart failure history Exisiting   Comorbidities HTN;CKD/renal insufficiency;CVA;DM;Hx MI/CAD;Atrial fibillation   Readmitted within 30 days No     Assessment   Lives alone Yes   Primary support person daughter/son in law   Mode of transportation family/friends  Other services involved Other  home aide   Home equipement Cane;Scale;Walker     Weight   Weighs self daily Yes   Scale provided No  has own scales-telephonic   Records on weight chart Yes     Resources   Has "Living better w/heart failure" book Yes   Has HF Zone tool Yes   Able to identify yellow zone signs/when to call MD Yes   Records zone daily No     Medications   Uses a pill box Yes   Who stocks the pill box paramedicine   Pill box checked this visit Yes   Pill box refilled this visit Yes   Difficulty obtaining medications No   Mail order medications Yes   New medications at home today Yes   Which ones ferrous sulfate   Missed one or more doses of medications per week Yes   How many missed doses this week 2     Nutrition   Patient receives meals on wheels No   Patient follows low sodium diet No   Has foods at home that meet the current recommended diet Yes   Patient follows low sugar/card diet No   Nutritional concerns/issues y     Activity Level   ADL's/Mobility Independent   How many feet can patient ambulate >50f   Typical activity level minimum    Barriers none     Urine   Difficulty urinating No   Changes in urine None     Time spent with patient   Time spent with patient  60 Minutes        Future Appointments Date Time Provider  [place] 03/05/2017 2:15 PM [facility] None  04/20/2017 2:40 PM [facility] CL[facility] None   BP (!) 142/60   Pulse 74   Resp 15   Wt 184 lb 9.6 oz (83.7 kg)   SpO2 97%   BMI 25.04 kg/m  Weight yesterday-190 Last visit weight-187 CBG-PTA-50 this morning--after eating 195  Pt reports that he is feeling well, pt denies any dizziness, no sob, no h/a. Weighing daily. meds verified and pill box refilled. Pt states he wants to get active in church, he also is trying to cut back on the smoking and drinking. Still has a home aide for assistance that he likes and is working out for him.  Still only wanting to eat once daily with a couple snacks before and after.    ACTION: Home visit completed Next visit planned for 1 week   KMarylouise Stacks EMT-Paramedic 03/02/17

## 2017-03-05 ENCOUNTER — Ambulatory Visit (HOSPITAL_COMMUNITY)
Admission: RE | Admit: 2017-03-05 | Discharge: 2017-03-05 | Disposition: A | Discharge status: Home / Self Care | Payer: Medicare Other | Source: Ambulatory Visit | Attending: Cardiology | Admitting: Cardiology

## 2017-03-05 DIAGNOSIS — I482 Chronic atrial fibrillation, unspecified: Secondary | ICD-10-CM

## 2017-03-05 LAB — BASIC METABOLIC PANEL
ANION GAP: 8 (ref 5–15)
BUN: 40 mg/dL — ABNORMAL HIGH (ref 6–20)
CHLORIDE: 102 mmol/L (ref 101–111)
CO2: 28 mmol/L (ref 22–32)
Calcium: 9.2 mg/dL (ref 8.9–10.3)
Creatinine, Ser: 2.34 mg/dL — ABNORMAL HIGH (ref 0.61–1.24)
GFR calc non Af Amer: 25 mL/min — ABNORMAL LOW (ref >60)
GFR, EST AFRICAN AMERICAN: 29 mL/min — AB (ref >60)
Glucose, Bld: 164 mg/dL — ABNORMAL HIGH (ref 65–99)
Potassium: 3.9 mmol/L (ref 3.5–5.1)
Sodium: 138 mmol/L (ref 135–145)

## 2017-03-09 ENCOUNTER — Other Ambulatory Visit (HOSPITAL_COMMUNITY): Payer: Self-pay

## 2017-03-09 ENCOUNTER — Telehealth (HOSPITAL_COMMUNITY): Payer: Self-pay | Admitting: *Deleted

## 2017-03-09 DIAGNOSIS — I5022 Chronic systolic (congestive) heart failure: Secondary | ICD-10-CM

## 2017-03-09 MED ORDER — FUROSEMIDE 20 MG PO TABS: ORAL_TABLET | ORAL | 3 refills | Status: DC

## 2017-03-09 NOTE — Progress Notes (Signed)
Paramedicine Encounter    Patient ID: [name], male    DOB: [birth year], 81 y.o.   MRN: [id number]   Patient Care Team: [name] May, MD as PCP - General (Internal Medicine)  Patient Active Problem List   Diagnosis Date Noted  . Chronic systolic CHF (congestive heart failure) ([place]) 10/29/2016  . Bipolar disorder ([place])   . Alcohol abuse 07/09/2016  . Demand ischemia ([place]) 05/11/2016  . Chronic anticoagulation-Eliquis 05/11/2016  . Elevated troponin 05/11/2016  . Symptomatic anemia 05/11/2016  . Anemia 11/28/2015  . CVA (cerebral infarction)-Augb 2015 08/25/2014  . Stroke ([place]) 08/25/2014  . CKD (chronic kidney disease), stage III 07/17/2014  . Tobacco abuse 06/27/2014  . Pulmonary edema   . Chronic atrial fibrillation ([place])   . Edema 08/20/2011  . DM (diabetes mellitus), type 2 with renal complications ([place]) 19/37/9024  . Hyperlipidemia associated with type 2 diabetes mellitus ([place]) 03/10/2010  . Essential hypertension 03/10/2010  . MYOCARDIAL INFARCTION 03/10/2010  . CAD- last PCI 2011 03/10/2010  . PVD 03/10/2010  . GERD 03/10/2010    Current Outpatient Prescriptions:  .  acetaminophen (TYLENOL) 325 MG tablet, Take 650 mg by mouth every 8 (eight) hours as needed for mild pain or fever (pain). , Disp: , Rfl:  .  albuterol (PROVENTIL HFA;VENTOLIN HFA) 108 (90 BASE) MCG/ACT inhaler, Inhale 2 puffs into the lungs every 6 (six) hours as needed for shortness of breath., Disp: , Rfl:  .  ammonium lactate (LAC-HYDRIN) 12 % lotion, Apply 1 application topically daily., Disp: , Rfl:  .  apixaban (ELIQUIS) 5 MG TABS tablet, Take 1 tablet (5 mg total) by mouth every 12 (twelve) hours., Disp: 60 tablet, Rfl: 3 .  atorvastatin (LIPITOR) 40 MG tablet, Take 1 tablet (40 mg total) by mouth daily. (Patient taking differently: Take 40 mg by mouth at bedtime. ), Disp: 90 tablet, Rfl: 3 .  dextrose (GLUTOSE) 40 % GEL, Take 1 Tube by mouth daily as needed for low blood sugar. , Disp: ,  Rfl:  .  feeding supplement, GLUCERNA SHAKE, (GLUCERNA SHAKE) LIQD, Take 237 mLs by mouth 3 (three) times daily between meals., Disp: , Rfl:  .  ferrous sulfate 325 (65 FE) MG tablet, Take 325 mg by mouth 2 (two) times daily., Disp: , Rfl:  .  finasteride (PROSCAR) 5 MG tablet, Take 5 mg by mouth daily.  , Disp: , Rfl:  .  folic acid (FOLVITE) 1 MG tablet, Take 1 tablet (1 mg total) by mouth daily., Disp: 30 tablet, Rfl: 0 .  gabapentin (NEURONTIN) 300 MG capsule, Take 300 mg by mouth 2 (two) times daily. , Disp: , Rfl:  .  hydrALAZINE (APRESOLINE) 25 MG tablet, Take 1.5 tablets (37.5 mg total) by mouth 3 (three) times daily., Disp: 90 tablet, Rfl: 6 .  insulin glargine (LANTUS) 100 UNIT/ML injection, Inject 40 Units into the skin daily., Disp: , Rfl:  .  isosorbide mononitrate (IMDUR) 30 MG 24 hr tablet, Take 1 tablet (30 mg total) by mouth daily., Disp: 90 tablet, Rfl: 3 .  metoprolol succinate (TOPROL-XL) 50 MG 24 hr tablet, Take 1 tablet (50 mg total) by mouth daily., Disp: 30 tablet, Rfl: 3 .  Multiple Vitamins-Minerals (MULTIVITAMIN WITH MINERALS) tablet, Take 1 tablet by mouth daily., Disp: , Rfl:  .  nicotine (NICOTROL) 10 MG inhaler, Inhale 1 Cartridge (1 continuous puffing total) into the lungs as needed for smoking cessation., Disp: 42 each, Rfl: 0 .  nystatin (MYCOSTATIN) 100000 UNIT/ML suspension, Take  5 mLs (500,000 Units total) by mouth 4 (four) times daily., Disp: 240 mL, Rfl: 0 .  pantoprazole (PROTONIX) 40 MG tablet, Take 1 tablet (40 mg total) by mouth daily., Disp: 30 tablet, Rfl: 11 .  spironolactone (ALDACTONE) 25 MG tablet, Take 0.5 tablets (12.5 mg total) by mouth daily., Disp: 45 tablet, Rfl: 3 .  Tamsulosin HCl (FLOMAX) 0.4 MG CAPS, Take 0.4 mg by mouth Nightly. , Disp: , Rfl:  .  thiamine 100 MG tablet, Take 1 tablet (100 mg total) by mouth daily., Disp: 30 tablet, Rfl: 0 .  furosemide (LASIX) 20 MG tablet, Take 20 mg (1 Tablet) daily alternating with 20 mg (1 Tablet) Two  times daily., Disp: 45 tablet, Rfl: 3 .  guaifenesin (HUMIBID E) 400 MG TABS tablet, Take 400 mg by mouth 3 (three) times daily as needed. For congestion, Disp: , Rfl:  .  insulin regular (NOVOLIN R,HUMULIN R) 100 units/mL injection, Inject 5 Units into the skin See admin instructions. Inject 5 units under the skin before breakfast and inject 5 units before evening meal, Disp: , Rfl:  .  simethicone (MYLICON) 80 MG chewable tablet, Chew 80 mg by mouth 3 (three) times daily as needed for flatulence. Take with meals as needed, Disp: , Rfl:  Allergies  Allergen Reactions  . Penicillins Swelling    Has patient had a PCN reaction causing immediate rash, facial/tongue/throat swelling, SOB or lightheadedness with hypotension: Yes Has patient had a PCN reaction causing severe rash involving mucus membranes or skin necrosis: No Has patient had a PCN reaction that required hospitalization: No Has patient had a PCN reaction occurring within the last 10 years: No If all of the above answers are "NO", then [name] proceed with Cephalosporin use.     Social History   Social History  . Marital status: Divorced    Spouse name: N/A  . Number of children: N/A  . Years of education: N/A   Occupational History  . retired Architect    Social History Main Topics  . Smoking status: Current Every Day Smoker    Packs/day: 0.50    Years: 50.00    Types: Cigarettes  . Smokeless tobacco: Never Used     Comment: He has a 50-pack-year hx of tobacco abuse. Currently, smoking about half a pack a day.  . Alcohol use Yes     Comment: Previously drank heavily, and now has a drink every other day or so.  . Drug use: Yes    Types: Marijuana, Cocaine  . Sexual activity: No   Other Topics Concern  . Not on file   Social History Narrative  . No narrative on file    Physical Exam  Constitutional: He is oriented to person, place, and time.  Neck: Normal range of motion.  Pulmonary/Chest: Effort normal. No  respiratory distress.  Abdominal: Soft.  Musculoskeletal: Normal range of motion. He exhibits no edema.  Neurological: He is oriented to person, place, and time.  Skin: Skin is warm and dry.  Psychiatric: He has a normal mood and affect.        SAFE - 02/09/17 1300      Situation   Admitting diagnosis chf   Heart failure history Exisiting   Comorbidities HTN;CKD/renal insufficiency;CVA;DM;Hx MI/CAD;Atrial fibillation   Readmitted within 30 days No     Assessment   Lives alone Yes   Primary support person daughter/son in law   Mode of transportation family/friends   Other services involved Other  home aide   Home equipement Cane;Scale;Walker     Weight   Weighs self daily Yes   Scale provided No  has own scales-telephonic   Records on weight chart Yes     Resources   Has "Living better w/heart failure" book Yes   Has HF Zone tool Yes   Able to identify yellow zone signs/when to call MD Yes   Records zone daily No     Medications   Uses a pill box Yes   Who stocks the pill box paramedicine   Pill box checked this visit Yes   Pill box refilled this visit Yes   Difficulty obtaining medications No   Mail order medications Yes   New medications at home today Yes   Which ones ferrous sulfate   Missed one or more doses of medications per week Yes   How many missed doses this week 2     Nutrition   Patient receives meals on wheels No   Patient follows low sodium diet No   Has foods at home that meet the current recommended diet Yes   Patient follows low sugar/card diet No   Nutritional concerns/issues y     Activity Level   ADL's/Mobility Independent   How many feet can patient ambulate >43f   Typical activity level minimum    Barriers none     Urine   Difficulty urinating No   Changes in urine None     Time spent with patient   Time spent with patient  60 Minutes        Future Appointments Date Time Provider [place] 03/16/2017 2:00 PM  [facility] None  04/20/2017 2:40 PM [facility] CLINIC [facility] None   BP (!) 108/52   Pulse 72   Resp 16   Wt 192 lb (87.1 kg)   SpO2 95%   BMI 26.04 kg/m  Weight yesterday-189.2 Last visit weight-184 CBG-127  Pt reports that is not sleeping all through out the night but he does take naps during the day.  He only missed one dose of meds last week-it was the thurs pm dose.  pts weight is up 8lbs from last visit-he reports that he has increased his meals to 2x a day-he does not feel more sob than usual. No swelling noted. He states that he has been having to take 4 of '325mg'$  tylenol several days for soreness to his upper body but it didn't help so on Sunday he took advil and that alleviated the soreness.  Pt reports that he had lab work done last Friday-contacted clinic to verify the notes about his lasix dosing-susie called me back to verify his dose needs to be lasix every other day according to the note in EPIC on his labs but Susie states he [name] want him to do '20mg'$  BID however she will have to check back with dr mAundra Dubinto verify and will let me know. She called me back prior to me leaving and confirmed  '20mg'$  daily and 20 mg BID every other day. Its in BID on thurs/sat/mon afternoon dose. Tried to call in his thiamine but it was not due to be refilled until April-he got some packages in the mail today so he will check in those to see if it came and if not he will contact the pharmacy himself.  meds verified and pill box filled.   KMarylouise Stacks EMT-Paramedic  03/09/17   ACTION: Home visit completed Next visit planned for 1  week

## 2017-03-09 NOTE — Telephone Encounter (Signed)
Basic metabolic panel  Order: [id number]  Status:  Final result Visible to patient:  No (Not Released) Dx:  Chronic atrial fibrillation ([place])  Notes Recorded by Kennieth Rad, RN on 03/09/2017 at 12:48 PM EDT Katie with Paramed called while visiting patient and stated he was up 8 lbs but it was related to dietary, no SOB of swelling. Spoke with Dr. Aundra Dubin about still holding lasix for 2 days and he recommends patient to just take Lasix 20 mg BID every other day. Patient agrees to come in next week for a BMET. Appointment made, lab order placed, and medication updated. ------  Notes Recorded by Harvie Junior, [place] on 03/05/2017 at 4:05 PM EST No answer left VM for pt to call back for lab results ------  Notes Recorded by Larey Dresser, MD on 03/05/2017 at 3:33 PM EST Hold Lasix x 2 days then restart every other day.

## 2017-03-16 ENCOUNTER — Telehealth (HOSPITAL_COMMUNITY): Payer: Self-pay | Admitting: *Deleted

## 2017-03-16 ENCOUNTER — Other Ambulatory Visit (HOSPITAL_COMMUNITY): Payer: Self-pay

## 2017-03-16 ENCOUNTER — Ambulatory Visit (HOSPITAL_COMMUNITY)
Admission: RE | Admit: 2017-03-16 | Discharge: 2017-03-16 | Disposition: A | Discharge status: Home / Self Care | Payer: Medicare Other | Source: Ambulatory Visit | Attending: Cardiology | Admitting: Cardiology

## 2017-03-16 DIAGNOSIS — I5022 Chronic systolic (congestive) heart failure: Secondary | ICD-10-CM

## 2017-03-16 LAB — BASIC METABOLIC PANEL
Anion gap: 7 (ref 5–15)
BUN: 36 mg/dL — ABNORMAL HIGH (ref 6–20)
CO2: 28 mmol/L (ref 22–32)
Calcium: 9.1 mg/dL (ref 8.9–10.3)
Chloride: 104 mmol/L (ref 101–111)
Creatinine, Ser: 2.34 mg/dL — ABNORMAL HIGH (ref 0.61–1.24)
GFR, EST AFRICAN AMERICAN: 29 mL/min — AB (ref >60)
GFR, EST NON AFRICAN AMERICAN: 25 mL/min — AB (ref >60)
Glucose, Bld: 196 mg/dL — ABNORMAL HIGH (ref 65–99)
Potassium: 3.7 mmol/L (ref 3.5–5.1)
Sodium: 139 mmol/L (ref 135–145)

## 2017-03-16 NOTE — Telephone Encounter (Signed)
Increase Lasix to 40 mg bid, needs BMET on Friday, needs followup appt.

## 2017-03-16 NOTE — Telephone Encounter (Signed)
[name] with paramedicine called patients weight is up 5lbs since last paramedicine visit swelling in legs and feet vest reading was 44. No sob or chest pain.  Please advise.

## 2017-03-16 NOTE — Addendum Note (Signed)
Encounter addended by: Flossie Dibble, [place] Work on: 03/16/2017  3:30 PM<BR>    Actions taken: Sign clinical note

## 2017-03-16 NOTE — Progress Notes (Signed)
Paramedicine Encounter    Patient ID: [name], male    DOB: 1936-06-09, 81 y.o.   MRN: [id number]   Patient Care Team: Beacher May, MD as PCP - General (Internal Medicine)  Patient Active Problem List   Diagnosis Date Noted  . Chronic systolic CHF (congestive heart failure) ([place]) 10/29/2016  . Bipolar disorder ([place])   . Alcohol abuse 07/09/2016  . Demand ischemia ([place]) 05/11/2016  . Chronic anticoagulation-Eliquis 05/11/2016  . Elevated troponin 05/11/2016  . Symptomatic anemia 05/11/2016  . Anemia 11/28/2015  . CVA (cerebral infarction)-Augb 2015 08/25/2014  . Stroke ([place]) 08/25/2014  . CKD (chronic kidney disease), stage III 07/17/2014  . Tobacco abuse 06/27/2014  . Pulmonary edema   . Chronic atrial fibrillation ([place])   . Edema 08/20/2011  . DM (diabetes mellitus), type 2 with renal complications ([place]) 78/29/5621  . Hyperlipidemia associated with type 2 diabetes mellitus ([place]) 03/10/2010  . Essential hypertension 03/10/2010  . MYOCARDIAL INFARCTION 03/10/2010  . CAD- last PCI 2011 03/10/2010  . PVD 03/10/2010  . GERD 03/10/2010    Current Outpatient Prescriptions:  .  acetaminophen (TYLENOL) 325 MG tablet, Take 650 mg by mouth every 8 (eight) hours as needed for mild pain or fever (pain). , Disp: , Rfl:  .  albuterol (PROVENTIL HFA;VENTOLIN HFA) 108 (90 BASE) MCG/ACT inhaler, Inhale 2 puffs into the lungs every 6 (six) hours as needed for shortness of breath., Disp: , Rfl:  .  ammonium lactate (LAC-HYDRIN) 12 % lotion, Apply 1 application topically daily., Disp: , Rfl:  .  apixaban (ELIQUIS) 5 MG TABS tablet, Take 1 tablet (5 mg total) by mouth every 12 (twelve) hours., Disp: 60 tablet, Rfl: 3 .  atorvastatin (LIPITOR) 40 MG tablet, Take 1 tablet (40 mg total) by mouth daily. (Patient taking differently: Take 40 mg by mouth at bedtime. ), Disp: 90 tablet, Rfl: 3 .  dextrose (GLUTOSE) 40 % GEL, Take 1 Tube by mouth daily as needed for low blood sugar. , Disp: ,  Rfl:  .  feeding supplement, GLUCERNA SHAKE, (GLUCERNA SHAKE) LIQD, Take 237 mLs by mouth 3 (three) times daily between meals., Disp: , Rfl:  .  ferrous sulfate 325 (65 FE) MG tablet, Take 325 mg by mouth 2 (two) times daily., Disp: , Rfl:  .  finasteride (PROSCAR) 5 MG tablet, Take 5 mg by mouth daily.  , Disp: , Rfl:  .  folic acid (FOLVITE) 1 MG tablet, Take 1 tablet (1 mg total) by mouth daily., Disp: 30 tablet, Rfl: 0 .  furosemide (LASIX) 20 MG tablet, Take 20 mg (1 Tablet) daily alternating with 20 mg (1 Tablet) Two times daily., Disp: 45 tablet, Rfl: 3 .  gabapentin (NEURONTIN) 300 MG capsule, Take 300 mg by mouth 2 (two) times daily. , Disp: , Rfl:  .  hydrALAZINE (APRESOLINE) 25 MG tablet, Take 1.5 tablets (37.5 mg total) by mouth 3 (three) times daily., Disp: 90 tablet, Rfl: 6 .  insulin glargine (LANTUS) 100 UNIT/ML injection, Inject 40 Units into the skin daily., Disp: , Rfl:  .  isosorbide mononitrate (IMDUR) 30 MG 24 hr tablet, Take 1 tablet (30 mg total) by mouth daily., Disp: 90 tablet, Rfl: 3 .  metoprolol succinate (TOPROL-XL) 50 MG 24 hr tablet, Take 1 tablet (50 mg total) by mouth daily., Disp: 30 tablet, Rfl: 3 .  Multiple Vitamins-Minerals (MULTIVITAMIN WITH MINERALS) tablet, Take 1 tablet by mouth daily., Disp: , Rfl:  .  nicotine (NICOTROL) 10 MG inhaler, Inhale  1 Cartridge (1 continuous puffing total) into the lungs as needed for smoking cessation., Disp: 42 each, Rfl: 0 .  nystatin (MYCOSTATIN) 100000 UNIT/ML suspension, Take 5 mLs (500,000 Units total) by mouth 4 (four) times daily., Disp: 240 mL, Rfl: 0 .  pantoprazole (PROTONIX) 40 MG tablet, Take 1 tablet (40 mg total) by mouth daily., Disp: 30 tablet, Rfl: 11 .  spironolactone (ALDACTONE) 25 MG tablet, Take 0.5 tablets (12.5 mg total) by mouth daily., Disp: 45 tablet, Rfl: 3 .  Tamsulosin HCl (FLOMAX) 0.4 MG CAPS, Take 0.4 mg by mouth Nightly. , Disp: , Rfl:  .  thiamine 100 MG tablet, Take 1 tablet (100 mg total) by  mouth daily., Disp: 30 tablet, Rfl: 0 .  guaifenesin (HUMIBID E) 400 MG TABS tablet, Take 400 mg by mouth 3 (three) times daily as needed. For congestion, Disp: , Rfl:  .  insulin regular (NOVOLIN R,HUMULIN R) 100 units/mL injection, Inject 5 Units into the skin See admin instructions. Inject 5 units under the skin before breakfast and inject 5 units before evening meal, Disp: , Rfl:  .  simethicone (MYLICON) 80 MG chewable tablet, Chew 80 mg by mouth 3 (three) times daily as needed for flatulence. Take with meals as needed, Disp: , Rfl:  Allergies  Allergen Reactions  . Penicillins Swelling    Has patient had a PCN reaction causing immediate rash, facial/tongue/throat swelling, SOB or lightheadedness with hypotension: Yes Has patient had a PCN reaction causing severe rash involving mucus membranes or skin necrosis: No Has patient had a PCN reaction that required hospitalization: No Has patient had a PCN reaction occurring within the last 10 years: No If all of the above answers are "NO", then may proceed with Cephalosporin use.     Social History   Social History  . Marital status: Divorced    Spouse name: N/A  . Number of children: N/A  . Years of education: N/A   Occupational History  . retired Architect    Social History Main Topics  . Smoking status: Current Every Day Smoker    Packs/day: 0.50    Years: 50.00    Types: Cigarettes  . Smokeless tobacco: Never Used     Comment: He has a 50-pack-year hx of tobacco abuse. Currently, smoking about half a pack a day.  . Alcohol use Yes     Comment: Previously drank heavily, and now has a drink every other day or so.  . Drug use: Yes    Types: Marijuana, Cocaine  . Sexual activity: No   Other Topics Concern  . Not on file   Social History Narrative  . No narrative on file    Physical Exam  Constitutional: He is oriented to person, place, and time.  HENT:  Head: Normocephalic.  Eyes: Pupils are equal, round, and  reactive to light.  Neck: Normal range of motion.  Pulmonary/Chest: Effort normal and breath sounds normal. No respiratory distress. He has no wheezes. He has no rales.  Abdominal: Soft.  Musculoskeletal: Normal range of motion. He exhibits edema.  Swelling to both feet, more so to the left than the right  Neurological: He is oriented to person, place, and time.  Skin: Skin is warm and dry.  Psychiatric: He has a normal mood and affect.        SAFE - 02/09/17 1300      Situation   Admitting diagnosis chf   Heart failure history Exisiting   Comorbidities HTN;CKD/renal insufficiency;CVA;DM;Hx MI/CAD;Atrial  fibillation   Readmitted within 30 days No     Assessment   Lives alone Yes   Primary support person daughter/son in law   Mode of transportation family/friends   Other services involved Other  home aide   Home equipement Cane;Scale;Walker     Weight   Weighs self daily Yes   Scale provided No  has own scales-telephonic   Records on weight chart Yes     Resources   Has "Living better w/heart failure" book Yes   Has HF Zone tool Yes   Able to identify yellow zone signs/when to call MD Yes   Records zone daily No     Medications   Uses a pill box Yes   Who stocks the pill box paramedicine   Pill box checked this visit Yes   Pill box refilled this visit Yes   Difficulty obtaining medications No   Mail order medications Yes   New medications at home today Yes   Which ones ferrous sulfate   Missed one or more doses of medications per week Yes   How many missed doses this week 2     Nutrition   Patient receives meals on wheels No   Patient follows low sodium diet No   Has foods at home that meet the current recommended diet Yes   Patient follows low sugar/card diet No   Nutritional concerns/issues y     Activity Level   ADL's/Mobility Independent   How many feet can patient ambulate >3f   Typical activity level minimum    Barriers none     Urine    Difficulty urinating No   Changes in urine None     Time spent with patient   Time spent with patient  60 Minutes          ReDS Vest - 03/16/17 1500      ReDS Vest   MR  No   Estimated volume prior to reading Low   Fitting Posture Standing   Height Marker Tall   Ruler Value 11.5   [place]  ReDS Value 44      Future Appointments Date Time Provider DNiota 04/20/2017 2:40 PM [facility]IN[facility] None   BP 138/70   Pulse 66   Resp 16   Ht 6' (1.829 m)   Wt 197 lb (89.4 kg)   SpO2 98%   BMI 26.72 kg/m  Weight yesterday-192 Last visit weight-192 CBG-187  Pt just got back from clinic with labs-he states his weight is up to 197 from 192 yesterday but he weighed today when he got back and he ate breakfast this morning.  meds verified and pill box refilled. No complaints today. No sob. No h/a. No dizziness.  He does have swelling noted to his left foot more than the right. Vest reading elevated at 44%- contacted clinic and jazmin was going to contact [facility] and then let me know for further instructions.    ACTION: Home visit completed Next visit planned for next week    KMarylouise Stacks EMT-Paramedic  03/16/17

## 2017-03-16 NOTE — Progress Notes (Signed)
SW Intern met with pt in clinic. Pt indicated he was extremely disgruntled with care and treatment he had received and that he was worried about his health. Pt verbalized understanding of low sodium diet but avidly stated he would not stop his salt intake due to the fact that he "Only wanna eat food that smell good and food with no salt don't smell good". SW Intern reminded pt of the severity of his health and importance of following his diet. Pt indicated he understood why diet was important but that "no salt takes a backseat to life" indicating the fact that he had "lived 70 sum odd years with salt and it ain't kill me yet". SW Intern asked pt to explain his understanding of his illness and pt stated he "wasn't dumb just hard headed". Pt verbalized understanding of need to weigh and limit fluid intake along with need to take medications as directed. Pt appeared visibly annoyed but thanked SW Intern and indicated he would be in touch if he needed any help. SW provided supportive listening and discussed case further with paramedicine professional SW Intern will continue to be available in conjunction with paramedicine team.  [place] Intern Raquel Sarna, [place] [phone]

## 2017-03-17 ENCOUNTER — Other Ambulatory Visit (HOSPITAL_COMMUNITY): Payer: Self-pay

## 2017-03-17 MED ORDER — FUROSEMIDE 20 MG PO TABS: 40.0000 mg | ORAL_TABLET | Freq: Two times a day (BID) | ORAL | 3 refills | Status: DC

## 2017-03-17 NOTE — Progress Notes (Signed)
Paramedicine Encounter    Patient ID: [name], male    DOB: 10/25/1936, 81 y.o.   MRN: [id number]   Patient Care Team: Beacher May, MD as PCP - General (Internal Medicine)  Patient Active Problem List   Diagnosis Date Noted  . Chronic systolic CHF (congestive heart failure) ([place]) 10/29/2016  . Bipolar disorder ([place])   . Alcohol abuse 07/09/2016  . Demand ischemia ([place]) 05/11/2016  . Chronic anticoagulation-Eliquis 05/11/2016  . Elevated troponin 05/11/2016  . Symptomatic anemia 05/11/2016  . Anemia 11/28/2015  . CVA (cerebral infarction)-Augb 2015 08/25/2014  . Stroke ([place]) 08/25/2014  . CKD (chronic kidney disease), stage III 07/17/2014  . Tobacco abuse 06/27/2014  . Pulmonary edema   . Chronic atrial fibrillation ([place])   . Edema 08/20/2011  . DM (diabetes mellitus), type 2 with renal complications ([place]) 24/40/1027  . Hyperlipidemia associated with type 2 diabetes mellitus ([place]) 03/10/2010  . Essential hypertension 03/10/2010  . MYOCARDIAL INFARCTION 03/10/2010  . CAD- last PCI 2011 03/10/2010  . PVD 03/10/2010  . GERD 03/10/2010    Current Outpatient Prescriptions:  .  acetaminophen (TYLENOL) 325 MG tablet, Take 650 mg by mouth every 8 (eight) hours as needed for mild pain or fever (pain). , Disp: , Rfl:  .  albuterol (PROVENTIL HFA;VENTOLIN HFA) 108 (90 BASE) MCG/ACT inhaler, Inhale 2 puffs into the lungs every 6 (six) hours as needed for shortness of breath., Disp: , Rfl:  .  ammonium lactate (LAC-HYDRIN) 12 % lotion, Apply 1 application topically daily., Disp: , Rfl:  .  apixaban (ELIQUIS) 5 MG TABS tablet, Take 1 tablet (5 mg total) by mouth every 12 (twelve) hours., Disp: 60 tablet, Rfl: 3 .  atorvastatin (LIPITOR) 40 MG tablet, Take 1 tablet (40 mg total) by mouth daily. (Patient taking differently: Take 40 mg by mouth at bedtime. ), Disp: 90 tablet, Rfl: 3 .  dextrose (GLUTOSE) 40 % GEL, Take 1 Tube by mouth daily as needed for low blood sugar. , Disp: ,  Rfl:  .  feeding supplement, GLUCERNA SHAKE, (GLUCERNA SHAKE) LIQD, Take 237 mLs by mouth 3 (three) times daily between meals., Disp: , Rfl:  .  ferrous sulfate 325 (65 FE) MG tablet, Take 325 mg by mouth 2 (two) times daily., Disp: , Rfl:  .  finasteride (PROSCAR) 5 MG tablet, Take 5 mg by mouth daily.  , Disp: , Rfl:  .  folic acid (FOLVITE) 1 MG tablet, Take 1 tablet (1 mg total) by mouth daily., Disp: 30 tablet, Rfl: 0 .  furosemide (LASIX) 20 MG tablet, Take 2 tablets (40 mg total) by mouth 2 (two) times daily., Disp: 60 tablet, Rfl: 3 .  gabapentin (NEURONTIN) 300 MG capsule, Take 300 mg by mouth 2 (two) times daily. , Disp: , Rfl:  .  guaifenesin (HUMIBID E) 400 MG TABS tablet, Take 400 mg by mouth 3 (three) times daily as needed. For congestion, Disp: , Rfl:  .  hydrALAZINE (APRESOLINE) 25 MG tablet, Take 1.5 tablets (37.5 mg total) by mouth 3 (three) times daily., Disp: 90 tablet, Rfl: 6 .  insulin glargine (LANTUS) 100 UNIT/ML injection, Inject 40 Units into the skin daily., Disp: , Rfl:  .  insulin regular (NOVOLIN R,HUMULIN R) 100 units/mL injection, Inject 5 Units into the skin See admin instructions. Inject 5 units under the skin before breakfast and inject 5 units before evening meal, Disp: , Rfl:  .  isosorbide mononitrate (IMDUR) 30 MG 24 hr tablet, Take 1 tablet (  30 mg total) by mouth daily., Disp: 90 tablet, Rfl: 3 .  metoprolol succinate (TOPROL-XL) 50 MG 24 hr tablet, Take 1 tablet (50 mg total) by mouth daily., Disp: 30 tablet, Rfl: 3 .  Multiple Vitamins-Minerals (MULTIVITAMIN WITH MINERALS) tablet, Take 1 tablet by mouth daily., Disp: , Rfl:  .  nicotine (NICOTROL) 10 MG inhaler, Inhale 1 Cartridge (1 continuous puffing total) into the lungs as needed for smoking cessation., Disp: 42 each, Rfl: 0 .  nystatin (MYCOSTATIN) 100000 UNIT/ML suspension, Take 5 mLs (500,000 Units total) by mouth 4 (four) times daily., Disp: 240 mL, Rfl: 0 .  pantoprazole (PROTONIX) 40 MG tablet, Take  1 tablet (40 mg total) by mouth daily., Disp: 30 tablet, Rfl: 11 .  simethicone (MYLICON) 80 MG chewable tablet, Chew 80 mg by mouth 3 (three) times daily as needed for flatulence. Take with meals as needed, Disp: , Rfl:  .  spironolactone (ALDACTONE) 25 MG tablet, Take 0.5 tablets (12.5 mg total) by mouth daily., Disp: 45 tablet, Rfl: 3 .  Tamsulosin HCl (FLOMAX) 0.4 MG CAPS, Take 0.4 mg by mouth Nightly. , Disp: , Rfl:  .  thiamine 100 MG tablet, Take 1 tablet (100 mg total) by mouth daily., Disp: 30 tablet, Rfl: 0 Allergies  Allergen Reactions  . Penicillins Swelling    Has patient had a PCN reaction causing immediate rash, facial/tongue/throat swelling, SOB or lightheadedness with hypotension: Yes Has patient had a PCN reaction causing severe rash involving mucus membranes or skin necrosis: No Has patient had a PCN reaction that required hospitalization: No Has patient had a PCN reaction occurring within the last 10 years: No If all of the above answers are "NO", then may proceed with Cephalosporin use.     Social History   Social History  . Marital status: Divorced    Spouse name: N/A  . Number of children: N/A  . Years of education: N/A   Occupational History  . retired Architect    Social History Main Topics  . Smoking status: Current Every Day Smoker    Packs/day: 0.50    Years: 50.00    Types: Cigarettes  . Smokeless tobacco: Never Used     Comment: He has a 50-pack-year hx of tobacco abuse. Currently, smoking about half a pack a day.  . Alcohol use Yes     Comment: Previously drank heavily, and now has a drink every other day or so.  . Drug use: Yes    Types: Marijuana, Cocaine  . Sexual activity: No   Other Topics Concern  . Not on file   Social History Narrative  . No narrative on file    Physical Exam      SAFE - 02/09/17 1300      Situation   Admitting diagnosis chf   Heart failure history Exisiting   Comorbidities HTN;CKD/renal  insufficiency;CVA;DM;Hx MI/CAD;Atrial fibillation   Readmitted within 30 days No     Assessment   Lives alone Yes   Primary support person daughter/son in law   Mode of transportation family/friends   Other services involved Other  home aide   Home equipement Cane;Scale;Walker     Weight   Weighs self daily Yes   Scale provided No  has own scales-telephonic   Records on weight chart Yes     Resources   Has "Living better w/heart failure" book Yes   Has HF Zone tool Yes   Able to identify yellow zone signs/when to call MD Yes  Records zone daily No     Medications   Uses a pill box Yes   Who stocks the pill box paramedicine   Pill box checked this visit Yes   Pill box refilled this visit Yes   Difficulty obtaining medications No   Mail order medications Yes   New medications at home today Yes   Which ones ferrous sulfate   Missed one or more doses of medications per week Yes   How many missed doses this week 2     Nutrition   Patient receives meals on wheels No   Patient follows low sodium diet No   Has foods at home that meet the current recommended diet Yes   Patient follows low sugar/card diet No   Nutritional concerns/issues y     Activity Level   ADL's/Mobility Independent   How many feet can patient ambulate >65f   Typical activity level minimum    Barriers none     Urine   Difficulty urinating No   Changes in urine None     Time spent with patient   Time spent with patient  60 Minutes          ReDS Vest - 03/16/17 1500      ReDS Vest   MR  No   Estimated volume prior to reading Low   Fitting Posture Standing   Height Marker Tall   Ruler Value 11.5   [place] Al[place]   ReDS Value 44      Future Appointments Date Time Provider DVienna 04/20/2017 2:40 PM [facility] None   Pt had a medication change therefore, I came out to revise pt's pill box. Only urinated once today, no abnormal back pain (hx chronic back  pain) and no penial pain.  Pt denies sob, lightheadiness, headaches, and chest pain. Pt pill box revised; lasix 2 pills twice daily (2nd dose afternoon slot).  Wt 195 lb (88.5 kg)   BMI 26.45 kg/m   Weight yesterday-197 Last visit weight-197(same day)    ACTION: Home visit completed

## 2017-03-17 NOTE — Telephone Encounter (Signed)
Called Katie this morning and she is aware of medication change, she will see patient this afternoon.  She asked me for me to call him to set up lab and follow up appointment but told me to wait until later in the morning when he wakes up.  Will call back to make appointment.    Medication changed and lab order placed for now.

## 2017-03-18 NOTE — Telephone Encounter (Signed)
Pt sch for bmet 3/23 and APP clinic 4/2

## 2017-03-19 ENCOUNTER — Ambulatory Visit (HOSPITAL_COMMUNITY)
Admission: RE | Admit: 2017-03-19 | Discharge: 2017-03-19 | Disposition: A | Discharge status: Home / Self Care | Payer: Medicare Other | Source: Ambulatory Visit | Attending: Internal Medicine | Admitting: Internal Medicine

## 2017-03-19 DIAGNOSIS — I5022 Chronic systolic (congestive) heart failure: Secondary | ICD-10-CM | POA: Insufficient documentation

## 2017-03-19 LAB — BASIC METABOLIC PANEL
ANION GAP: 9 (ref 5–15)
BUN: 34 mg/dL — ABNORMAL HIGH (ref 6–20)
CHLORIDE: 104 mmol/L (ref 101–111)
CO2: 25 mmol/L (ref 22–32)
CREATININE: 1.97 mg/dL — AB (ref 0.61–1.24)
Calcium: 8.9 mg/dL (ref 8.9–10.3)
GFR calc Af Amer: 36 mL/min — ABNORMAL LOW (ref >60)
GFR, EST NON AFRICAN AMERICAN: 31 mL/min — AB (ref >60)
Glucose, Bld: 137 mg/dL — ABNORMAL HIGH (ref 65–99)
Potassium: 3.7 mmol/L (ref 3.5–5.1)
Sodium: 138 mmol/L (ref 135–145)

## 2017-03-23 ENCOUNTER — Telehealth (HOSPITAL_COMMUNITY): Payer: Self-pay | Admitting: *Deleted

## 2017-03-23 ENCOUNTER — Other Ambulatory Visit (HOSPITAL_COMMUNITY): Payer: Self-pay

## 2017-03-23 MED ORDER — FUROSEMIDE 20 MG PO TABS: 20.0000 mg | ORAL_TABLET | Freq: Two times a day (BID) | ORAL | 3 refills | Status: DC

## 2017-03-23 NOTE — Telephone Encounter (Signed)
Katie called to clarify pt's Lasix dose, he has been taking 40 mg BID since last week (3/20) per Dr Aundra Dubin and wt is down 9 lbs and she feels this dose maybe too much for pt, prior to that dose he was only on 20 mg BID QOD.  Per chart review:  on 3/9 per Dr McLean:Hold Lasix x 2 days then restart every other day.  AND PT WAS RESTARTED AT 20 MG BID QOD on 3/13    On 3/20 Katie called b/c pt had gained 5 lbs in a week, per Dr Aundra Dubin: Increase Lasix to 40 mg bid, needs BMET on Friday, needs followup appt. This was started 3/21  Feel pt's increase in Lasix dose is too much for pt, Joellen Jersey is in agreement, she will have pt decrease to 20 mg BID every day until his appt on Mon 4/2, he can have repeat labs and dose adjustment at that time, recent bmet (3/23) was stable per Dr Aundra Dubin.  Info sent to Dr Aundra Dubin for his review, med list updated

## 2017-03-23 NOTE — Progress Notes (Signed)
Paramedicine Encounter    Patient ID: [name], male    DOB: Apr 30, 1936, 81 y.o.   MRN: [id number]   Patient Care Team: Beacher May, MD as PCP - General (Internal Medicine)  Patient Active Problem List   Diagnosis Date Noted  . Chronic systolic CHF (congestive heart failure) ([place]) 10/29/2016  . Bipolar disorder ([place])   . Alcohol abuse 07/09/2016  . Demand ischemia ([place]) 05/11/2016  . Chronic anticoagulation-Eliquis 05/11/2016  . Elevated troponin 05/11/2016  . Symptomatic anemia 05/11/2016  . Anemia 11/28/2015  . CVA (cerebral infarction)-Augb 2015 08/25/2014  . Stroke ([place]) 08/25/2014  . CKD (chronic kidney disease), stage III 07/17/2014  . Tobacco abuse 06/27/2014  . Pulmonary edema   . Chronic atrial fibrillation ([place])   . Edema 08/20/2011  . DM (diabetes mellitus), type 2 with renal complications ([place]) 54/27/0623  . Hyperlipidemia associated with type 2 diabetes mellitus ([place]) 03/10/2010  . Essential hypertension 03/10/2010  . MYOCARDIAL INFARCTION 03/10/2010  . CAD- last PCI 2011 03/10/2010  . PVD 03/10/2010  . GERD 03/10/2010    Current Outpatient Prescriptions:  .  acetaminophen (TYLENOL) 325 MG tablet, Take 650 mg by mouth every 8 (eight) hours as needed for mild pain or fever (pain). , Disp: , Rfl:  .  albuterol (PROVENTIL HFA;VENTOLIN HFA) 108 (90 BASE) MCG/ACT inhaler, Inhale 2 puffs into the lungs every 6 (six) hours as needed for shortness of breath., Disp: , Rfl:  .  ammonium lactate (LAC-HYDRIN) 12 % lotion, Apply 1 application topically daily., Disp: , Rfl:  .  apixaban (ELIQUIS) 5 MG TABS tablet, Take 1 tablet (5 mg total) by mouth every 12 (twelve) hours., Disp: 60 tablet, Rfl: 3 .  atorvastatin (LIPITOR) 40 MG tablet, Take 1 tablet (40 mg total) by mouth daily. (Patient taking differently: Take 40 mg by mouth at bedtime. ), Disp: 90 tablet, Rfl: 3 .  dextrose (GLUTOSE) 40 % GEL, Take 1 Tube by mouth daily as needed for low blood sugar. , Disp: ,  Rfl:  .  feeding supplement, GLUCERNA SHAKE, (GLUCERNA SHAKE) LIQD, Take 237 mLs by mouth 3 (three) times daily between meals., Disp: , Rfl:  .  ferrous sulfate 325 (65 FE) MG tablet, Take 325 mg by mouth 2 (two) times daily., Disp: , Rfl:  .  finasteride (PROSCAR) 5 MG tablet, Take 5 mg by mouth daily.  , Disp: , Rfl:  .  folic acid (FOLVITE) 1 MG tablet, Take 1 tablet (1 mg total) by mouth daily., Disp: 30 tablet, Rfl: 0 .  gabapentin (NEURONTIN) 300 MG capsule, Take 300 mg by mouth 2 (two) times daily. , Disp: , Rfl:  .  hydrALAZINE (APRESOLINE) 25 MG tablet, Take 1.5 tablets (37.5 mg total) by mouth 3 (three) times daily., Disp: 90 tablet, Rfl: 6 .  insulin glargine (LANTUS) 100 UNIT/ML injection, Inject 40 Units into the skin daily., Disp: , Rfl:  .  isosorbide mononitrate (IMDUR) 30 MG 24 hr tablet, Take 1 tablet (30 mg total) by mouth daily., Disp: 90 tablet, Rfl: 3 .  metoprolol succinate (TOPROL-XL) 50 MG 24 hr tablet, Take 1 tablet (50 mg total) by mouth daily., Disp: 30 tablet, Rfl: 3 .  Multiple Vitamins-Minerals (MULTIVITAMIN WITH MINERALS) tablet, Take 1 tablet by mouth daily., Disp: , Rfl:  .  nicotine (NICOTROL) 10 MG inhaler, Inhale 1 Cartridge (1 continuous puffing total) into the lungs as needed for smoking cessation., Disp: 42 each, Rfl: 0 .  nystatin (MYCOSTATIN) 100000 UNIT/ML suspension, Take  5 mLs (500,000 Units total) by mouth 4 (four) times daily., Disp: 240 mL, Rfl: 0 .  pantoprazole (PROTONIX) 40 MG tablet, Take 1 tablet (40 mg total) by mouth daily., Disp: 30 tablet, Rfl: 11 .  spironolactone (ALDACTONE) 25 MG tablet, Take 0.5 tablets (12.5 mg total) by mouth daily., Disp: 45 tablet, Rfl: 3 .  Tamsulosin HCl (FLOMAX) 0.4 MG CAPS, Take 0.4 mg by mouth Nightly. , Disp: , Rfl:  .  thiamine 100 MG tablet, Take 1 tablet (100 mg total) by mouth daily., Disp: 30 tablet, Rfl: 0 .  furosemide (LASIX) 20 MG tablet, Take 2 tablets (40 mg total) by mouth 2 (two) times daily., Disp:  60 tablet, Rfl: 3 .  guaifenesin (HUMIBID E) 400 MG TABS tablet, Take 400 mg by mouth 3 (three) times daily as needed. For congestion, Disp: , Rfl:  .  insulin regular (NOVOLIN R,HUMULIN R) 100 units/mL injection, Inject 5 Units into the skin See admin instructions. Inject 5 units under the skin before breakfast and inject 5 units before evening meal, Disp: , Rfl:  .  simethicone (MYLICON) 80 MG chewable tablet, Chew 80 mg by mouth 3 (three) times daily as needed for flatulence. Take with meals as needed, Disp: , Rfl:  Allergies  Allergen Reactions  . Penicillins Swelling    Has patient had a PCN reaction causing immediate rash, facial/tongue/throat swelling, SOB or lightheadedness with hypotension: Yes Has patient had a PCN reaction causing severe rash involving mucus membranes or skin necrosis: No Has patient had a PCN reaction that required hospitalization: No Has patient had a PCN reaction occurring within the last 10 years: No If all of the above answers are "NO", then may proceed with Cephalosporin use.     Social History   Social History  . Marital status: Divorced    Spouse name: N/A  . Number of children: N/A  . Years of education: N/A   Occupational History  . retired Architect    Social History Main Topics  . Smoking status: Current Every Day Smoker    Packs/day: 0.50    Years: 50.00    Types: Cigarettes  . Smokeless tobacco: Never Used     Comment: He has a 50-pack-year hx of tobacco abuse. Currently, smoking about half a pack a day.  . Alcohol use Yes     Comment: Previously drank heavily, and now has a drink every other day or so.  . Drug use: Yes    Types: Marijuana, Cocaine  . Sexual activity: No   Other Topics Concern  . Not on file   Social History Narrative  . No narrative on file    Physical Exam  Constitutional: He is oriented to person, place, and time.  Pulmonary/Chest: Effort normal and breath sounds normal. No respiratory distress. He has no  wheezes. He has no rales.  Abdominal: Soft.  Musculoskeletal: He exhibits no edema.  Neurological: He is oriented to person, place, and time.  Skin: Skin is warm and dry.  Psychiatric: He has a normal mood and affect.        SAFE - 02/09/17 1300      Situation   Admitting diagnosis chf   Heart failure history Exisiting   Comorbidities HTN;CKD/renal insufficiency;CVA;DM;Hx MI/CAD;Atrial fibillation   Readmitted within 30 days No     Assessment   Lives alone Yes   Primary support person daughter/son in law   Mode of transportation family/friends   Other services involved Other  home  aide   Home equipement Cane;Scale;Walker     Weight   Weighs self daily Yes   Scale provided No  has own scales-telephonic   Records on weight chart Yes     Resources   Has "Living better w/heart failure" book Yes   Has HF Zone tool Yes   Able to identify yellow zone signs/when to call MD Yes   Records zone daily No     Medications   Uses a pill box Yes   Who stocks the pill box paramedicine   Pill box checked this visit Yes   Pill box refilled this visit Yes   Difficulty obtaining medications No   Mail order medications Yes   New medications at home today Yes   Which ones ferrous sulfate   Missed one or more doses of medications per week Yes   How many missed doses this week 2     Nutrition   Patient receives meals on wheels No   Patient follows low sodium diet No   Has foods at home that meet the current recommended diet Yes   Patient follows low sugar/card diet No   Nutritional concerns/issues y     Activity Level   ADL's/Mobility Independent   How many feet can patient ambulate >51f   Typical activity level minimum    Barriers none     Urine   Difficulty urinating No   Changes in urine None     Time spent with patient   Time spent with patient  60 Minutes        Future Appointments Date Time Provider [place] 03/29/2017 2:30 PM [facility] PA/NP [facility]  None  04/20/2017 2:40 PM [facility] CLINIC [facility] None   BP 130/64   Pulse 66   Resp 16   Wt 186 lb 12.8 oz (84.7 kg)   SpO2 97%   BMI 25.33 kg/m  Weight yesterday-190 Last visit weight-195 [facility] PTA-146 [facility] EMS-244  Pt reports he is feeling good, it appears that he missed last thursdays noon time dose. He denies any h/a, no dizziness, no sob. No bleeding issues that he is able to tell being on the iron. Spoke with heather to clarify his lasix dose and she advised to do lasix '20mg'$  BID and he has f/u next Monday at clinic. He is out of his thiamine. He is taking his lantus 40U during our visit as he has not taken it today yet. We walked down the hall to his mailbox to see if the thiamine has came in but it did not. He ordered it at end of last week and [place] may take 7[place]   ACTION: Home visit completed Next visit planned for next monday at clinic   KMarylouise Stacks EOlivia03/27/18

## 2017-03-29 ENCOUNTER — Other Ambulatory Visit (HOSPITAL_COMMUNITY): Payer: Self-pay

## 2017-03-29 ENCOUNTER — Encounter (HOSPITAL_COMMUNITY): Payer: Self-pay

## 2017-03-29 ENCOUNTER — Ambulatory Visit (HOSPITAL_COMMUNITY)
Admission: RE | Admit: 2017-03-29 | Discharge: 2017-03-29 | Disposition: A | Discharge status: Home / Self Care | Payer: Medicare Other | Source: Ambulatory Visit | Attending: Internal Medicine | Admitting: Internal Medicine

## 2017-03-29 VITALS — BP 108/62 | HR 75 | Wt 187.8 lb

## 2017-03-29 DIAGNOSIS — E1169 Type 2 diabetes mellitus with other specified complication: Secondary | ICD-10-CM

## 2017-03-29 DIAGNOSIS — I255 Ischemic cardiomyopathy: Secondary | ICD-10-CM | POA: Diagnosis not present

## 2017-03-29 DIAGNOSIS — I130 Hypertensive heart and chronic kidney disease with heart failure and stage 1 through stage 4 chronic kidney disease, or unspecified chronic kidney disease: Secondary | ICD-10-CM | POA: Insufficient documentation

## 2017-03-29 DIAGNOSIS — I482 Chronic atrial fibrillation, unspecified: Secondary | ICD-10-CM

## 2017-03-29 DIAGNOSIS — F17210 Nicotine dependence, cigarettes, uncomplicated: Secondary | ICD-10-CM | POA: Insufficient documentation

## 2017-03-29 DIAGNOSIS — N183 Chronic kidney disease, stage 3 unspecified: Secondary | ICD-10-CM

## 2017-03-29 DIAGNOSIS — F319 Bipolar disorder, unspecified: Secondary | ICD-10-CM | POA: Diagnosis not present

## 2017-03-29 DIAGNOSIS — I10 Essential (primary) hypertension: Secondary | ICD-10-CM | POA: Diagnosis not present

## 2017-03-29 DIAGNOSIS — F1010 Alcohol abuse, uncomplicated: Secondary | ICD-10-CM

## 2017-03-29 DIAGNOSIS — Z8546 Personal history of malignant neoplasm of prostate: Secondary | ICD-10-CM | POA: Insufficient documentation

## 2017-03-29 DIAGNOSIS — Z720 Tobacco use: Secondary | ICD-10-CM | POA: Diagnosis not present

## 2017-03-29 DIAGNOSIS — I5022 Chronic systolic (congestive) heart failure: Secondary | ICD-10-CM | POA: Diagnosis not present

## 2017-03-29 DIAGNOSIS — Z7901 Long term (current) use of anticoagulants: Secondary | ICD-10-CM | POA: Insufficient documentation

## 2017-03-29 DIAGNOSIS — Z79899 Other long term (current) drug therapy: Secondary | ICD-10-CM | POA: Diagnosis not present

## 2017-03-29 DIAGNOSIS — E1122 Type 2 diabetes mellitus with diabetic chronic kidney disease: Secondary | ICD-10-CM | POA: Insufficient documentation

## 2017-03-29 DIAGNOSIS — Z8673 Personal history of transient ischemic attack (TIA), and cerebral infarction without residual deficits: Secondary | ICD-10-CM | POA: Diagnosis not present

## 2017-03-29 DIAGNOSIS — E785 Hyperlipidemia, unspecified: Secondary | ICD-10-CM

## 2017-03-29 DIAGNOSIS — I2510 Atherosclerotic heart disease of native coronary artery without angina pectoris: Secondary | ICD-10-CM | POA: Diagnosis not present

## 2017-03-29 DIAGNOSIS — Z794 Long term (current) use of insulin: Secondary | ICD-10-CM | POA: Diagnosis not present

## 2017-03-29 LAB — BASIC METABOLIC PANEL
Anion gap: 10 (ref 5–15)
BUN: 51 mg/dL — AB (ref 6–20)
CHLORIDE: 102 mmol/L (ref 101–111)
CO2: 28 mmol/L (ref 22–32)
Calcium: 9.1 mg/dL (ref 8.9–10.3)
Creatinine, Ser: 2.62 mg/dL — ABNORMAL HIGH (ref 0.61–1.24)
GFR calc Af Amer: 25 mL/min — ABNORMAL LOW (ref >60)
GFR, EST NON AFRICAN AMERICAN: 22 mL/min — AB (ref >60)
GLUCOSE: 212 mg/dL — AB (ref 65–99)
POTASSIUM: 3.8 mmol/L (ref 3.5–5.1)
Sodium: 140 mmol/L (ref 135–145)

## 2017-03-29 LAB — CBC
HEMATOCRIT: 36.9 % — AB (ref 39.0–52.0)
Hemoglobin: 11.9 g/dL — ABNORMAL LOW (ref 13.0–17.0)
MCH: 30.1 pg (ref 26.0–34.0)
MCHC: 32.2 g/dL (ref 30.0–36.0)
MCV: 93.4 fL (ref 78.0–100.0)
Platelets: 242 10*3/uL (ref 150–400)
RBC: 3.95 MIL/uL — ABNORMAL LOW (ref 4.22–5.81)
RDW: 15.7 % — ABNORMAL HIGH (ref 11.5–15.5)
WBC: 4.7 10*3/uL (ref 4.0–10.5)

## 2017-03-29 NOTE — Patient Instructions (Signed)
Routine lab work today. Will notify you of abnormal results, otherwise no news is good news!  No changes to medication at this time.  Follow up 2-3 months with Dr. Aundra Dubin.  Do the following things EVERYDAY: 1) Weigh yourself in the morning before breakfast. Write it down and keep it in a log. 2) Take your medicines as prescribed 3) Eat low salt foods-Limit salt (sodium) to 2000 mg per day.  4) Stay as active as you can everyday 5) Limit all fluids for the day to less than 2 liters

## 2017-03-29 NOTE — Progress Notes (Signed)
PCP: Dr. 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 male of HTN, CKD stage III, DM2, Prior CVA, CAD due to ICM, PAD, chronic afib, and ETOH abuse. He had a LCx stent in 2011 and has a known occluded RCA.  He has been in chronic atrial fibrillation.  Prior history of GI bleeding with ablation of small bowel AVMs in 5/17. Most recent echo in 5/17 with EF 25-30%.  He lives in an independent living facility.  He was admitted in 7/17 with acute on chronic systolic CHF and diuresed.    Myoview 10/27/16 with EF 19% with large severe defect in the basal inferior, basal inferolateral, mid inferior and mid inferolateral location consistent with previous MI.    Pt presents today for regular follow up. Weight shows up 9 lbs since visit in February. Lasix has been adjusted multiple times over the past month. Breathing doing good currently. Lasix at 20 mg BID, had been up as high as 40 mg BID but felt that was too much.  Smoking 1/2 to 1 ppd. Drinking "1/2 gallon" of Bourbon. No CP. Eating and drinking whatever he wants.  Denies orthopnea. Paramedicine following. Using a cane for balance.   Review of systems complete and found to be negative unless listed in HPI.    Labs (7/17): K 4.2, creatinine 2.62 Labs (08/12/2016) K 4.2, creatinine 2.25.  Labs (9/17): K 4.2, creatinine 1.45, hgb 12.2 Labs (10/17): LDL 44 Labs (11/17): K 3.6, creatinine 1.97 Labs (12/17): hgb 13.3 Labs (2/18): K 3.5, creatinine 2  PMH: 1. HTN 2. Type II diabetes 3. H/o CVA 4. H/o GI bleeding: Small bowel AVM ablation in 5/17.  5. Prostate cancer 6. Bipolar disorder 7. ETOH abuse 8. PAD 9. CAD: DES to LCx in 2011, RCA noted to be chronically totally occluded.  - Myoview 10/27/16 with EF 19% with large severe defect in the basal inferior, basal inferolateral, mid inferior and mid inferolateral location consistent with previous MI.   10. Chronic systolic CHF: Suspect ischemic cardiomyopathy but may have  contribution from ETOH abuse.   - Echo (8/15): EF 40-45%, mildly dilated RV. - Echo (5/17): EF 25-30%, normal RV size and systolic function.  11. CKD: Stage III.  12. Chronic atrial fibrillation  SH: Lives alone in an independent living facility.  1/2 gallon liquor/week.  Active smoker.    Family History  Problem Relation Age of Onset  . Cancer Father   . Cancer Mother   . Cancer Sister    ROS: All systems reviewed and negative except as per HPI.   Current Outpatient Prescriptions  Medication Sig Dispense Refill  . acetaminophen (TYLENOL) 325 MG tablet Take 650 mg by mouth every 8 (eight) hours as needed for mild pain or fever (pain).     Marland Kitchen albuterol (PROVENTIL HFA;VENTOLIN HFA) 108 (90 BASE) MCG/ACT inhaler Inhale 2 puffs into the lungs every 6 (six) hours as needed for shortness of breath.    Marland Kitchen ammonium lactate (LAC-HYDRIN) 12 % lotion Apply 1 application topically daily.    Marland Kitchen apixaban (ELIQUIS) 5 MG TABS tablet Take 1 tablet (5 mg total) by mouth every 12 (twelve) hours. 60 tablet 3  . atorvastatin (LIPITOR) 40 MG tablet Take 1 tablet (40 mg total) by mouth daily. (Patient taking differently: Take 40 mg by mouth at bedtime. ) 90 tablet 3  . dextrose (GLUTOSE) 40 % GEL Take 1 Tube by mouth daily as needed for low blood sugar.     Marland Kitchen  feeding supplement, GLUCERNA SHAKE, (GLUCERNA SHAKE) LIQD Take 237 mLs by mouth 3 (three) times daily between meals.    . ferrous sulfate 325 (65 FE) MG tablet Take 325 mg by mouth 2 (two) times daily.    . finasteride (PROSCAR) 5 MG tablet Take 5 mg by mouth daily.      . folic acid (FOLVITE) 1 MG tablet Take 1 tablet (1 mg total) by mouth daily. 30 tablet 0  . furosemide (LASIX) 20 MG tablet Take 1 tablet (20 mg total) by mouth 2 (two) times daily. 60 tablet 3  . gabapentin (NEURONTIN) 300 MG capsule Take 300 mg by mouth 2 (two) times daily.     Marland Kitchen guaifenesin (HUMIBID E) 400 MG TABS tablet Take 400 mg by mouth 3 (three) times daily as needed. For  congestion    . hydrALAZINE (APRESOLINE) 25 MG tablet Take 1.5 tablets (37.5 mg total) by mouth 3 (three) times daily. 90 tablet 6  . insulin glargine (LANTUS) 100 UNIT/ML injection Inject 40 Units into the skin daily.    . insulin regular (NOVOLIN R,HUMULIN R) 100 units/mL injection Inject 5 Units into the skin See admin instructions. Inject 5 units under the skin before breakfast and inject 5 units before evening meal    . isosorbide mononitrate (IMDUR) 30 MG 24 hr tablet Take 1 tablet (30 mg total) by mouth daily. 90 tablet 3  . metoprolol succinate (TOPROL-XL) 50 MG 24 hr tablet Take 1 tablet (50 mg total) by mouth daily. 30 tablet 3  . Multiple Vitamins-Minerals (MULTIVITAMIN WITH MINERALS) tablet Take 1 tablet by mouth daily.    . nicotine (NICOTROL) 10 MG inhaler Inhale 1 Cartridge (1 continuous puffing total) into the lungs as needed for smoking cessation. 42 each 0  . nystatin (MYCOSTATIN) 100000 UNIT/ML suspension Take 5 mLs (500,000 Units total) by mouth 4 (four) times daily. 240 mL 0  . pantoprazole (PROTONIX) 40 MG tablet Take 1 tablet (40 mg total) by mouth daily. 30 tablet 11  . simethicone (MYLICON) 80 MG chewable tablet Chew 80 mg by mouth 3 (three) times daily as needed for flatulence. Take with meals as needed    . spironolactone (ALDACTONE) 25 MG tablet Take 0.5 tablets (12.5 mg total) by mouth daily. 45 tablet 3  . Tamsulosin HCl (FLOMAX) 0.4 MG CAPS Take 0.4 mg by mouth Nightly.     . thiamine 100 MG tablet Take 1 tablet (100 mg total) by mouth daily. 30 tablet 0   No current facility-administered medications for this encounter.    BP 108/62   Pulse 75   Wt 187 lb 12.8 oz (85.2 kg)   SpO2 99%   BMI 25.47 kg/m    Wt Readings from Last 3 Encounters:  03/29/17 187 lb 12.8 oz (85.2 kg)  03/23/17 186 lb 12.8 oz (84.7 kg)  03/17/17 195 lb (88.5 kg)    Physical Exam  General: Elderly. NAD. No resp difficulty.  Psych: Normal affect. HEENT: Normal, without mass or  lesion. Neck: Supple, no bruits or JVD. Carotids 2+. No lymphadenopathy/thyromegaly appreciated. Heart: PMI nondisplaced. RRR no s3, s4, or murmurs. Lungs: Resp regular and unlabored, CTA. Abdomen: Soft, non-tender, non-distended, No HSM, BS + x 4.  Extremities: No clubbing or cyanosis. DP/PT/Radials 2+ and equal bilaterally. Left ankle with 1+ edema chronically.  Neuro: Alert and oriented X 3. Moves all extremities spontaneously.   Assessment/Plan: 1. CAD:  - No chest pain. Cardiolite 10/17 with infarct, no ischemia.  - No ASA  with stable CAD and Eliquis.  - Continue statin.  2. Chronic systolic CHF: EF 83-72% on 5/17 echo, which is down from 40-45% in 8/15. Suspect ischemic cardiomyopathy with possible contribution from ETOH.   - NYHA Class II by pt report.  - Continue lasix 20 mg BID. Should take extra 20-40 mg as needed for weight gain  - Continue hydralazine 37.5 mg tid and continue Imdur 30 mg daily.   - Continue Toprol XL 50 daily.  - Continue spironolactone 12.5 mg daily.   - Hold off on ACEI/ARB/ARNI for now with elevated creatinine. 3. PAD:  - No claudication reported. Would avoid cilostazol given CHF.  4. CKD: Stage III - BMET today.  5. ETOH abuse: - Pt drinking 1/2 gallon of bourbon per week (~10 oz or 4-5 drinks per day).   - Pt not interested in cessation or cutting back. This is likely contributed to his CMP.  6. Smoking:  - Pt continues to smoke, and increasing. Now up to 1 ppd.  Not interested in cessation.  7. Atrial fibrillation:  - Chronic. No recent GI bleeding.  - Continue Eliquis 5 mg BID.    Pt has incredibly poor insight into his disease and is frustrated at the way his prognosis was presented after his hospitalization. It was discussed with pt that he could have as high as 50% mortality rate at 6  months, or less if he continued to drink, smoke, and be non-compliant with his medication.  Pt states he told his family he had less than six months to  live and is upset that this was inacurrate.   Pt overall stable. OK on current lasix. Will check labs today and continue.   Shirley Friar, PA-C  03/29/2017

## 2017-03-29 NOTE — Progress Notes (Signed)
Paramedicine Encounter   Patient ID: [name] , male,   DOB: October 27, 1936,80 y.o.,  MRN: [id number]   Met patient in clinic today with provider.  Time spent with patient 35 min  Pt still hasnt received his thiamine yet-he states he ordered it last week. He states he will call the pharmacy today. He reports that last Thursday he went out to the lunchroom and hung out with his old friends, stayed out to play cards with them and he reports he had fun and will plan on doing it again. He reports he walked all around the halls without difficulty. He did not bring his meds with them so I will have to come out tomorrow to refill his pill box.  No changes today.  Marylouise Stacks, EMT-Paramedic 03/29/2017  ACTION: Visit completed.  Will see him tomor for med rec and pill box refilled.

## 2017-03-30 ENCOUNTER — Other Ambulatory Visit (HOSPITAL_COMMUNITY): Payer: Self-pay

## 2017-03-30 NOTE — Progress Notes (Signed)
Paramedicine Encounter    Patient ID: KENNA KIRN, male    DOB: 1936-06-14, 81 y.o.   MRN: [id number]   Patient Care Team: Beacher May, MD as PCP - General (Internal Medicine)  Patient Active Problem List   Diagnosis Date Noted  . Chronic systolic CHF (congestive heart failure) ([place]) 10/29/2016  . Bipolar disorder ([place])   . Alcohol abuse 07/09/2016  . Demand ischemia ([place]) 05/11/2016  . Chronic anticoagulation-Eliquis 05/11/2016  . Elevated troponin 05/11/2016  . Symptomatic anemia 05/11/2016  . Anemia 11/28/2015  . CVA (cerebral infarction)-Augb 2015 08/25/2014  . Stroke ([place]) 08/25/2014  . CKD (chronic kidney disease), stage III 07/17/2014  . Tobacco abuse 06/27/2014  . Pulmonary edema   . Chronic atrial fibrillation ([place])   . Edema 08/20/2011  . DM (diabetes mellitus), type 2 with renal complications ([place]) 82/80/0349  . Hyperlipidemia associated with type 2 diabetes mellitus ([place]) 03/10/2010  . Essential hypertension 03/10/2010  . MYOCARDIAL INFARCTION 03/10/2010  . CAD- last PCI 2011 03/10/2010  . PVD 03/10/2010  . GERD 03/10/2010    Current Outpatient Prescriptions:  .  acetaminophen (TYLENOL) 325 MG tablet, Take 650 mg by mouth every 8 (eight) hours as needed for mild pain or fever (pain). , Disp: , Rfl:  .  albuterol (PROVENTIL HFA;VENTOLIN HFA) 108 (90 BASE) MCG/ACT inhaler, Inhale 2 puffs into the lungs every 6 (six) hours as needed for shortness of breath., Disp: , Rfl:  .  ammonium lactate (LAC-HYDRIN) 12 % lotion, Apply 1 application topically daily., Disp: , Rfl:  .  apixaban (ELIQUIS) 5 MG TABS tablet, Take 1 tablet (5 mg total) by mouth every 12 (twelve) hours., Disp: 60 tablet, Rfl: 3 .  atorvastatin (LIPITOR) 40 MG tablet, Take 1 tablet (40 mg total) by mouth daily. (Patient taking differently: Take 40 mg by mouth at bedtime. ), Disp: 90 tablet, Rfl: 3 .  dextrose (GLUTOSE) 40 % GEL, Take 1 Tube by mouth daily as needed for low blood sugar. , Disp: ,  Rfl:  .  feeding supplement, GLUCERNA SHAKE, (GLUCERNA SHAKE) LIQD, Take 237 mLs by mouth 3 (three) times daily between meals., Disp: , Rfl:  .  ferrous sulfate 325 (65 FE) MG tablet, Take 325 mg by mouth 2 (two) times daily., Disp: , Rfl:  .  finasteride (PROSCAR) 5 MG tablet, Take 5 mg by mouth daily.  , Disp: , Rfl:  .  folic acid (FOLVITE) 1 MG tablet, Take 1 tablet (1 mg total) by mouth daily., Disp: 30 tablet, Rfl: 0 .  furosemide (LASIX) 20 MG tablet, Take 1 tablet (20 mg total) by mouth 2 (two) times daily., Disp: 60 tablet, Rfl: 3 .  gabapentin (NEURONTIN) 300 MG capsule, Take 300 mg by mouth 2 (two) times daily. , Disp: , Rfl:  .  hydrALAZINE (APRESOLINE) 25 MG tablet, Take 1.5 tablets (37.5 mg total) by mouth 3 (three) times daily., Disp: 90 tablet, Rfl: 6 .  insulin glargine (LANTUS) 100 UNIT/ML injection, Inject 40 Units into the skin daily., Disp: , Rfl:  .  isosorbide mononitrate (IMDUR) 30 MG 24 hr tablet, Take 1 tablet (30 mg total) by mouth daily., Disp: 90 tablet, Rfl: 3 .  metoprolol succinate (TOPROL-XL) 50 MG 24 hr tablet, Take 1 tablet (50 mg total) by mouth daily., Disp: 30 tablet, Rfl: 3 .  Multiple Vitamins-Minerals (MULTIVITAMIN WITH MINERALS) tablet, Take 1 tablet by mouth daily., Disp: , Rfl:  .  nicotine (NICOTROL) 10 MG inhaler, Inhale 1 Cartridge (1  continuous puffing total) into the lungs as needed for smoking cessation., Disp: 42 each, Rfl: 0 .  nystatin (MYCOSTATIN) 100000 UNIT/ML suspension, Take 5 mLs (500,000 Units total) by mouth 4 (four) times daily., Disp: 240 mL, Rfl: 0 .  pantoprazole (PROTONIX) 40 MG tablet, Take 1 tablet (40 mg total) by mouth daily., Disp: 30 tablet, Rfl: 11 .  spironolactone (ALDACTONE) 25 MG tablet, Take 0.5 tablets (12.5 mg total) by mouth daily., Disp: 45 tablet, Rfl: 3 .  Tamsulosin HCl (FLOMAX) 0.4 MG CAPS, Take 0.4 mg by mouth Nightly. , Disp: , Rfl:  .  guaifenesin (HUMIBID E) 400 MG TABS tablet, Take 400 mg by mouth 3 (three)  times daily as needed. For congestion, Disp: , Rfl:  .  insulin regular (NOVOLIN R,HUMULIN R) 100 units/mL injection, Inject 5 Units into the skin See admin instructions. Inject 5 units under the skin before breakfast and inject 5 units before evening meal, Disp: , Rfl:  .  simethicone (MYLICON) 80 MG chewable tablet, Chew 80 mg by mouth 3 (three) times daily as needed for flatulence. Take with meals as needed, Disp: , Rfl:  .  thiamine 100 MG tablet, Take 1 tablet (100 mg total) by mouth daily. (Patient not taking: Reported on 03/30/2017), Disp: 30 tablet, Rfl: 0 Allergies  Allergen Reactions  . Penicillins Swelling    Has patient had a PCN reaction causing immediate rash, facial/tongue/throat swelling, SOB or lightheadedness with hypotension: Yes Has patient had a PCN reaction causing severe rash involving mucus membranes or skin necrosis: No Has patient had a PCN reaction that required hospitalization: No Has patient had a PCN reaction occurring within the last 10 years: No If all of the above answers are "NO", then may proceed with Cephalosporin use.     Social History   Social History  . Marital status: Divorced    Spouse name: N/A  . Number of children: N/A  . Years of education: N/A   Occupational History  . retired Architect    Social History Main Topics  . Smoking status: Current Every Day Smoker    Packs/day: 0.50    Years: 50.00    Types: Cigarettes  . Smokeless tobacco: Never Used     Comment: He has a 50-pack-year hx of tobacco abuse. Currently, smoking about half a pack a day.  . Alcohol use Yes     Comment: Previously drank heavily, and now has a drink every other day or so.  . Drug use: Yes    Types: Marijuana, Cocaine  . Sexual activity: No   Other Topics Concern  . Not on file   Social History Narrative  . No narrative on file    Physical Exam      SAFE - 02/09/17 1300      Situation   Admitting diagnosis chf   Heart failure history Exisiting    Comorbidities HTN;CKD/renal insufficiency;CVA;DM;Hx MI/CAD;Atrial fibillation   Readmitted within 30 days No     Assessment   Lives alone Yes   Primary support person daughter/son in law   Mode of transportation family/friends   Other services involved Other  home aide   Home equipement Cane;Scale;Walker     Weight   Weighs self daily Yes   Scale provided No  has own scales-telephonic   Records on weight chart Yes     Resources   Has "Living better w/heart failure" book Yes   Has HF Zone tool Yes   Able to identify yellow  zone signs/when to call MD Yes   Records zone daily No     Medications   Uses a pill box Yes   Who stocks the pill box paramedicine   Pill box checked this visit Yes   Pill box refilled this visit Yes   Difficulty obtaining medications No   Mail order medications Yes   New medications at home today Yes   Which ones ferrous sulfate   Missed one or more doses of medications per week Yes   How many missed doses this week 2     Nutrition   Patient receives meals on wheels No   Patient follows low sodium diet No   Has foods at home that meet the current recommended diet Yes   Patient follows low sugar/card diet No   Nutritional concerns/issues y     Activity Level   ADL's/Mobility Independent   How many feet can patient ambulate >36f   Typical activity level minimum    Barriers none     Urine   Difficulty urinating No   Changes in urine None     Time spent with patient   Time spent with patient  60 Minutes        Future Appointments Date Time Provider [place] 04/20/2017 2:40 PM [facility]  06/18/2017 2:00 PM [facility] None   BP 118/74   Pulse 66   Resp 16   Wt 189 lb 9.6 oz (86 kg)   SpO2 96%   BMI 25.71 kg/m  Weight yesterday-187 Last visit weight-187 [id number]  Came out today for med rec and to fill pill box. Met him in clinic visit yesterday. He was still upset about the conversation  of his prognosis and the confusion about what was said during the recent visits.  He missed last Thursday and fridays pm dose of meds. Still out of the thiamine. He ordered it last week. CBG as noted-pt states he just ate. He does have some swelling to his left foot, as it was the same as yesterday. He still smokes and drinks ETOH, eats high sodium foods and uses salt shaker. He wants to eat only once daily to keep his weight stable, advised him to eat a few meals a day to help keep his CBG stabalized and he needs to get active also.    ACTION: Home visit completed Next visit planned for next week   KMarylouise Stacks EMT-Paramedic 03/30/17

## 2017-04-06 ENCOUNTER — Telehealth (HOSPITAL_COMMUNITY): Payer: Self-pay | Admitting: *Deleted

## 2017-04-06 ENCOUNTER — Other Ambulatory Visit (HOSPITAL_COMMUNITY): Payer: Self-pay

## 2017-04-06 NOTE — Telephone Encounter (Signed)
Katie called to report pt has gained 3 lb overnight.  Advised per last OV pt can take extra Lasix 20 mg today and if not improved can take an extra 20 mg again tomorrow.  She will advise pt and see pt again on Friday to f/u

## 2017-04-06 NOTE — Progress Notes (Signed)
Paramedicine Encounter    Patient ID: [name], male    DOB: 1936-06-14, 81 y.o.   MRN: [id number]   Patient Care Team: Beacher May, MD as PCP - General (Internal Medicine)  Patient Active Problem List   Diagnosis Date Noted  . Chronic systolic CHF (congestive heart failure) ([place]) 10/29/2016  . Bipolar disorder ([place])   . Alcohol abuse 07/09/2016  . Demand ischemia ([place]) 05/11/2016  . Chronic anticoagulation-Eliquis 05/11/2016  . Elevated troponin 05/11/2016  . Symptomatic anemia 05/11/2016  . Anemia 11/28/2015  . CVA (cerebral infarction)-Augb 2015 08/25/2014  . Stroke ([place]) 08/25/2014  . CKD (chronic kidney disease), stage III 07/17/2014  . Tobacco abuse 06/27/2014  . Pulmonary edema   . Chronic atrial fibrillation ([place])   . Edema 08/20/2011  . DM (diabetes mellitus), type 2 with renal complications ([place]) 82/80/0349  . Hyperlipidemia associated with type 2 diabetes mellitus ([place]) 03/10/2010  . Essential hypertension 03/10/2010  . MYOCARDIAL INFARCTION 03/10/2010  . CAD- last PCI 2011 03/10/2010  . PVD 03/10/2010  . GERD 03/10/2010    Current Outpatient Prescriptions:  .  acetaminophen (TYLENOL) 325 MG tablet, Take 650 mg by mouth every 8 (eight) hours as needed for mild pain or fever (pain). , Disp: , Rfl:  .  albuterol (PROVENTIL HFA;VENTOLIN HFA) 108 (90 BASE) MCG/ACT inhaler, Inhale 2 puffs into the lungs every 6 (six) hours as needed for shortness of breath., Disp: , Rfl:  .  ammonium lactate (LAC-HYDRIN) 12 % lotion, Apply 1 application topically daily., Disp: , Rfl:  .  apixaban (ELIQUIS) 5 MG TABS tablet, Take 1 tablet (5 mg total) by mouth every 12 (twelve) hours., Disp: 60 tablet, Rfl: 3 .  atorvastatin (LIPITOR) 40 MG tablet, Take 1 tablet (40 mg total) by mouth daily. (Patient taking differently: Take 40 mg by mouth at bedtime. ), Disp: 90 tablet, Rfl: 3 .  dextrose (GLUTOSE) 40 % GEL, Take 1 Tube by mouth daily as needed for low blood sugar. , Disp: ,  Rfl:  .  feeding supplement, GLUCERNA SHAKE, (GLUCERNA SHAKE) LIQD, Take 237 mLs by mouth 3 (three) times daily between meals., Disp: , Rfl:  .  ferrous sulfate 325 (65 FE) MG tablet, Take 325 mg by mouth 2 (two) times daily., Disp: , Rfl:  .  finasteride (PROSCAR) 5 MG tablet, Take 5 mg by mouth daily.  , Disp: , Rfl:  .  folic acid (FOLVITE) 1 MG tablet, Take 1 tablet (1 mg total) by mouth daily., Disp: 30 tablet, Rfl: 0 .  furosemide (LASIX) 20 MG tablet, Take 1 tablet (20 mg total) by mouth 2 (two) times daily., Disp: 60 tablet, Rfl: 3 .  gabapentin (NEURONTIN) 300 MG capsule, Take 300 mg by mouth 2 (two) times daily. , Disp: , Rfl:  .  hydrALAZINE (APRESOLINE) 25 MG tablet, Take 1.5 tablets (37.5 mg total) by mouth 3 (three) times daily., Disp: 90 tablet, Rfl: 6 .  insulin glargine (LANTUS) 100 UNIT/ML injection, Inject 40 Units into the skin daily., Disp: , Rfl:  .  isosorbide mononitrate (IMDUR) 30 MG 24 hr tablet, Take 1 tablet (30 mg total) by mouth daily., Disp: 90 tablet, Rfl: 3 .  metoprolol succinate (TOPROL-XL) 50 MG 24 hr tablet, Take 1 tablet (50 mg total) by mouth daily., Disp: 30 tablet, Rfl: 3 .  Multiple Vitamins-Minerals (MULTIVITAMIN WITH MINERALS) tablet, Take 1 tablet by mouth daily., Disp: , Rfl:  .  nicotine (NICOTROL) 10 MG inhaler, Inhale 1 Cartridge (1  continuous puffing total) into the lungs as needed for smoking cessation., Disp: 42 each, Rfl: 0 .  nystatin (MYCOSTATIN) 100000 UNIT/ML suspension, Take 5 mLs (500,000 Units total) by mouth 4 (four) times daily., Disp: 240 mL, Rfl: 0 .  pantoprazole (PROTONIX) 40 MG tablet, Take 1 tablet (40 mg total) by mouth daily., Disp: 30 tablet, Rfl: 11 .  spironolactone (ALDACTONE) 25 MG tablet, Take 0.5 tablets (12.5 mg total) by mouth daily., Disp: 45 tablet, Rfl: 3 .  Tamsulosin HCl (FLOMAX) 0.4 MG CAPS, Take 0.4 mg by mouth Nightly. , Disp: , Rfl:  .  guaifenesin (HUMIBID E) 400 MG TABS tablet, Take 400 mg by mouth 3 (three)  times daily as needed. For congestion, Disp: , Rfl:  .  insulin regular (NOVOLIN R,HUMULIN R) 100 units/mL injection, Inject 5 Units into the skin See admin instructions. Inject 5 units under the skin before breakfast and inject 5 units before evening meal, Disp: , Rfl:  .  simethicone (MYLICON) 80 MG chewable tablet, Chew 80 mg by mouth 3 (three) times daily as needed for flatulence. Take with meals as needed, Disp: , Rfl:  .  thiamine 100 MG tablet, Take 1 tablet (100 mg total) by mouth daily. (Patient not taking: Reported on 03/30/2017), Disp: 30 tablet, Rfl: 0 Allergies  Allergen Reactions  . Penicillins Swelling    Has patient had a PCN reaction causing immediate rash, facial/tongue/throat swelling, SOB or lightheadedness with hypotension: Yes Has patient had a PCN reaction causing severe rash involving mucus membranes or skin necrosis: No Has patient had a PCN reaction that required hospitalization: No Has patient had a PCN reaction occurring within the last 10 years: No If all of the above answers are "NO", then may proceed with Cephalosporin use.     Social History   Social History  . Marital status: Divorced    Spouse name: N/A  . Number of children: N/A  . Years of education: N/A   Occupational History  . retired Architect    Social History Main Topics  . Smoking status: Current Every Day Smoker    Packs/day: 0.50    Years: 50.00    Types: Cigarettes  . Smokeless tobacco: Never Used     Comment: He has a 50-pack-year hx of tobacco abuse. Currently, smoking about half a pack a day.  . Alcohol use Yes     Comment: Previously drank heavily, and now has a drink every other day or so.  . Drug use: Yes    Types: Marijuana, Cocaine  . Sexual activity: No   Other Topics Concern  . Not on file   Social History Narrative  . No narrative on file    Physical Exam  Constitutional: He is oriented to person, place, and time.  Pulmonary/Chest: Effort normal and breath sounds  normal. No respiratory distress. He has no wheezes. He has no rales.  Abdominal: Soft.  Musculoskeletal: He exhibits edema.  Swelling to left foot    Neurological: He is oriented to person, place, and time.  Skin: Skin is warm and dry.  Psychiatric: He has a normal mood and affect.        SAFE - 02/09/17 1300      Situation   Admitting diagnosis chf   Heart failure history Exisiting   Comorbidities HTN;CKD/renal insufficiency;CVA;DM;Hx MI/CAD;Atrial fibillation   Readmitted within 30 days No     Assessment   Lives alone Yes   Primary support person daughter/son in law  Mode of transportation family/friends   Other services involved Other  home aide   Home equipement Cane;Scale;Walker     Weight   Weighs self daily Yes   Scale provided No  has own scales-telephonic   Records on weight chart Yes     Resources   Has "Living better w/heart failure" book Yes   Has HF Zone tool Yes   Able to identify yellow zone signs/when to call MD Yes   Records zone daily No     Medications   Uses a pill box Yes   Who stocks the pill box paramedicine   Pill box checked this visit Yes   Pill box refilled this visit Yes   Difficulty obtaining medications No   Mail order medications Yes   New medications at home today Yes   Which ones ferrous sulfate   Missed one or more doses of medications per week Yes   How many missed doses this week 2     Nutrition   Patient receives meals on wheels No   Patient follows low sodium diet No   Has foods at home that meet the current recommended diet Yes   Patient follows low sugar/card diet No   Nutritional concerns/issues y     Activity Level   ADL's/Mobility Independent   How many feet can patient ambulate >44f   Typical activity level minimum    Barriers none     Urine   Difficulty urinating No   Changes in urine None     Time spent with patient   Time spent with patient  60 Minutes        Future Appointments Date Time  Provider [place] 04/20/2017 2:40 [facility]  06/18/2017 2:00 PM [facility] None   BP (!) 130/50   Pulse 68   Resp 16   Wt 192 lb (87.1 kg)   SpO2 95%   BMI 26.04 kg/m  Weight yesterday-189 Last visit weight-189 [facility]-223  Pt states hes making it, his left foot is swollen that started sunday. He reports less urinating past 2 days. He is up 3lbs overnight-he  takes the lasix BID. He does have some swelling to the left foot. Pt states he doesn't feel like he is urinating as much as he usually does. Pt still out of thiamine-he has tried several times to get it refilled-tried to call again today but kept getting the automatic line-he thinks he has appointment with VCherry contacted heather about pts weight and she advised he can take an additional '20mg'$  lasix today and if not better tomor then another '20mg'$  for tomor. Pt aware and agreeable. No increased sob. No dizziness. No h/a. [facility] ferrous sulfate. Also advised him to elevate his feet. meds verified and pill box refilled.   ACTION: Home visit completed Next visit planned for next week   KMarylouise Stacks EMT-Paramedic 04/06/17

## 2017-04-12 ENCOUNTER — Other Ambulatory Visit (HOSPITAL_COMMUNITY): Payer: Self-pay
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
## 2017-04-20 ENCOUNTER — Other Ambulatory Visit (HOSPITAL_COMMUNITY): Payer: Self-pay

## 2017-04-20 ENCOUNTER — Ambulatory Visit (HOSPITAL_COMMUNITY)
Admission: RE | Admit: 2017-04-20 | Discharge: 2017-04-20 | Disposition: A | Discharge status: Home / Self Care | Payer: Medicare Other | Source: Ambulatory Visit | Attending: Cardiology | Admitting: Cardiology

## 2017-04-20 ENCOUNTER — Encounter (HOSPITAL_COMMUNITY): Payer: Self-pay

## 2017-04-20 VITALS — BP 121/67 | HR 76 | Wt 194.5 lb

## 2017-04-20 DIAGNOSIS — I739 Peripheral vascular disease, unspecified: Secondary | ICD-10-CM | POA: Diagnosis not present

## 2017-04-20 DIAGNOSIS — I482 Chronic atrial fibrillation, unspecified: Secondary | ICD-10-CM

## 2017-04-20 DIAGNOSIS — N183 Chronic kidney disease, stage 3 unspecified: Secondary | ICD-10-CM

## 2017-04-20 DIAGNOSIS — I5022 Chronic systolic (congestive) heart failure: Secondary | ICD-10-CM | POA: Insufficient documentation

## 2017-04-20 DIAGNOSIS — Z8673 Personal history of transient ischemic attack (TIA), and cerebral infarction without residual deficits: Secondary | ICD-10-CM | POA: Diagnosis not present

## 2017-04-20 DIAGNOSIS — E1122 Type 2 diabetes mellitus with diabetic chronic kidney disease: Secondary | ICD-10-CM | POA: Diagnosis not present

## 2017-04-20 DIAGNOSIS — Z9861 Coronary angioplasty status: Secondary | ICD-10-CM

## 2017-04-20 DIAGNOSIS — I130 Hypertensive heart and chronic kidney disease with heart failure and stage 1 through stage 4 chronic kidney disease, or unspecified chronic kidney disease: Secondary | ICD-10-CM | POA: Insufficient documentation

## 2017-04-20 DIAGNOSIS — Z8546 Personal history of malignant neoplasm of prostate: Secondary | ICD-10-CM | POA: Insufficient documentation

## 2017-04-20 DIAGNOSIS — Z794 Long term (current) use of insulin: Secondary | ICD-10-CM | POA: Diagnosis not present

## 2017-04-20 DIAGNOSIS — Z79899 Other long term (current) drug therapy: Secondary | ICD-10-CM | POA: Diagnosis not present

## 2017-04-20 DIAGNOSIS — F1010 Alcohol abuse, uncomplicated: Secondary | ICD-10-CM | POA: Diagnosis not present

## 2017-04-20 DIAGNOSIS — I2510 Atherosclerotic heart disease of native coronary artery without angina pectoris: Secondary | ICD-10-CM | POA: Diagnosis not present

## 2017-04-20 DIAGNOSIS — F17200 Nicotine dependence, unspecified, uncomplicated: Secondary | ICD-10-CM | POA: Diagnosis not present

## 2017-04-20 DIAGNOSIS — Z7901 Long term (current) use of anticoagulants: Secondary | ICD-10-CM | POA: Insufficient documentation

## 2017-04-20 DIAGNOSIS — Z955 Presence of coronary angioplasty implant and graft: Secondary | ICD-10-CM | POA: Diagnosis not present

## 2017-04-20 DIAGNOSIS — F319 Bipolar disorder, unspecified: Secondary | ICD-10-CM | POA: Diagnosis not present

## 2017-04-20 LAB — BASIC METABOLIC PANEL
Anion gap: 7 (ref 5–15)
BUN: 28 mg/dL — AB (ref 6–20)
CALCIUM: 9 mg/dL (ref 8.9–10.3)
CHLORIDE: 105 mmol/L (ref 101–111)
CO2: 31 mmol/L (ref 22–32)
Creatinine, Ser: 1.79 mg/dL — ABNORMAL HIGH (ref 0.61–1.24)
GFR, EST AFRICAN AMERICAN: 40 mL/min — AB (ref >60)
GFR, EST NON AFRICAN AMERICAN: 35 mL/min — AB (ref >60)
Glucose, Bld: 61 mg/dL — ABNORMAL LOW (ref 65–99)
Potassium: 3.5 mmol/L (ref 3.5–5.1)
SODIUM: 143 mmol/L (ref 135–145)

## 2017-04-20 LAB — CBC
HEMATOCRIT: 39 % (ref 39.0–52.0)
HEMOGLOBIN: 12.4 g/dL — AB (ref 13.0–17.0)
MCH: 29.8 pg (ref 26.0–34.0)
MCHC: 31.8 g/dL (ref 30.0–36.0)
MCV: 93.8 fL (ref 78.0–100.0)
Platelets: 229 10*3/uL (ref 150–400)
RBC: 4.16 MIL/uL — AB (ref 4.22–5.81)
RDW: 15.5 % (ref 11.5–15.5)
WBC: 5.1 10*3/uL (ref 4.0–10.5)

## 2017-04-20 MED ORDER — ISOSORBIDE MONONITRATE ER 30 MG PO TB24: 60.0000 mg | ORAL_TABLET | Freq: Every day | ORAL | 3 refills | Status: DC

## 2017-04-20 MED ORDER — FUROSEMIDE 20 MG PO TABS: ORAL_TABLET | ORAL | 3 refills | Status: AC

## 2017-04-20 NOTE — Patient Instructions (Signed)
Labs today (will call for abnormal results, otherwise no news is good news)  Increase Imdur to 60 mg (2 Tablets) Once Daily  Increase lasix to 40 mg (2 Tablets) in the AM, and 20 mg (1 Tablet) in the PM.    Labs in 10 days (BMET)  Follow up in 6 weeks.

## 2017-04-20 NOTE — Progress Notes (Signed)
Paramedicine Encounter   Patient ID: [name] , male,   DOB: 04/28/1936,80 y.o.,  MRN: [id number]   Met patient in clinic today with provider.  Time spent with patient 45 min  Med changes listed in provider notes. meds verified and pill box refilled. He missed one dose of last weeks meds on fri pm dose.  Weight today-192  Marylouise Stacks, EMT-Paramedic 04/20/2017   ACTION: Home visit completed Next visit planned for next week

## 2017-04-21 NOTE — Progress Notes (Signed)
PCP: Dr. 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 male of HTN, CKD stage III, DM2, Prior CVA, CAD due to ICM, PAD, chronic afib, and ETOH abuse. He had a LCx stent in 2011 and has a known occluded RCA.  He has been in chronic atrial fibrillation.  Prior history of GI bleeding with ablation of small bowel AVMs in 5/17. Most recent echo in 5/17 with EF 25-30%.  He lives in an independent living facility.  He was admitted in 7/17 with acute on chronic systolic CHF and diuresed.    Myoview 10/27/16 with EF 19% with large severe defect in the basal inferior, basal inferolateral, mid inferior and mid inferolateral location consistent with previous MI.    Pt presents today for regular follow up. He has gained about 7 lbs.  Still drinking heavily and smokes at least 1/2 ppd.  He has a quit date for the smoking set to the end of the month.  Feels like he has been stable.  Short of breath walking from house to car.  No problems walking around inside.  Uses cane for balance.  No chest pain.  No dizziness or falls.  Says he is taking all his meds, paramedicine visits him.  No BRBPR or melena.   Review of systems complete and found to be negative unless listed in HPI.    Labs (7/17): K 4.2, creatinine 2.62 Labs (08/12/2016) K 4.2, creatinine 2.25.  Labs (9/17): K 4.2, creatinine 1.45, hgb 12.2 Labs (10/17): LDL 44 Labs (11/17): K 3.6, creatinine 1.97 Labs (12/17): hgb 13.3 Labs (2/18): K 3.5, creatinine 2 Labs (4/18): K 3.8, creatinine 2.62, BUN 51, hgb 11.9  ECG (personally reviewed): atrial fibrillation, RBBB  PMH: 1. HTN 2. Type II diabetes 3. H/o CVA 4. H/o GI bleeding: Small bowel AVM ablation in 5/17.  5. Prostate cancer 6. Bipolar disorder 7. ETOH abuse 8. PAD 9. CAD: DES to LCx in 2011, RCA noted to be chronically totally occluded.  - Myoview 10/27/16 with EF 19% with large severe defect in the basal inferior, basal inferolateral, mid inferior and mid inferolateral  location consistent with previous MI.   10. Chronic systolic CHF: Suspect ischemic cardiomyopathy but may have contribution from ETOH abuse.   - Echo (8/15): EF 40-45%, mildly dilated RV. - Echo (5/17): EF 25-30%, normal RV size and systolic function.  11. CKD: Stage III.  12. Chronic atrial fibrillation  SH: Lives alone in an independent living facility.  1/2 gallon liquor/week.  Active smoker.    Family History  Problem Relation Age of Onset  . Cancer Father   . Cancer Mother   . Cancer Sister    ROS: All systems reviewed and negative except as per HPI.   Current Outpatient Prescriptions  Medication Sig Dispense Refill  . acetaminophen (TYLENOL) 325 MG tablet Take 650 mg by mouth every 8 (eight) hours as needed for mild pain or fever (pain).     Marland Kitchen albuterol (PROVENTIL HFA;VENTOLIN HFA) 108 (90 BASE) MCG/ACT inhaler Inhale 2 puffs into the lungs every 6 (six) hours as needed for shortness of breath.    Marland Kitchen ammonium lactate (LAC-HYDRIN) 12 % lotion Apply 1 application topically daily.    Marland Kitchen apixaban (ELIQUIS) 5 MG TABS tablet Take 1 tablet (5 mg total) by mouth every 12 (twelve) hours. 60 tablet 3  . atorvastatin (LIPITOR) 40 MG tablet Take 1 tablet (40 mg total) by mouth daily. 90 tablet 3  . dextrose (GLUTOSE) 40 %  GEL Take 1 Tube by mouth daily as needed for low blood sugar.     . feeding supplement, GLUCERNA SHAKE, (GLUCERNA SHAKE) LIQD Take 237 mLs by mouth 3 (three) times daily between meals.    . ferrous sulfate 325 (65 FE) MG tablet Take 325 mg by mouth 2 (two) times daily.    . finasteride (PROSCAR) 5 MG tablet Take 5 mg by mouth daily.      . folic acid (FOLVITE) 1 MG tablet Take 1 tablet (1 mg total) by mouth daily. 30 tablet 0  . furosemide (LASIX) 20 MG tablet Take 40 mg (2 Tablets) the AM and 20 mg (1 Tablet) in the PM 90 tablet 3  . gabapentin (NEURONTIN) 300 MG capsule Take 300 mg by mouth 2 (two) times daily.     Marland Kitchen guaifenesin (HUMIBID E) 400 MG TABS tablet Take 400 mg by  mouth 3 (three) times daily as needed. For congestion    . hydrALAZINE (APRESOLINE) 25 MG tablet Take 1.5 tablets (37.5 mg total) by mouth 3 (three) times daily. 90 tablet 6  . insulin glargine (LANTUS) 100 UNIT/ML injection Inject 40 Units into the skin daily.    . insulin regular (NOVOLIN R,HUMULIN R) 100 units/mL injection Inject 5 Units into the skin See admin instructions. Inject 5 units under the skin before breakfast and inject 5 units before evening meal    . isosorbide mononitrate (IMDUR) 30 MG 24 hr tablet Take 2 tablets (60 mg total) by mouth daily. 60 tablet 3  . metoprolol succinate (TOPROL-XL) 50 MG 24 hr tablet Take 1 tablet (50 mg total) by mouth daily. 30 tablet 3  . Multiple Vitamins-Minerals (MULTIVITAMIN WITH MINERALS) tablet Take 1 tablet by mouth daily.    . nicotine (NICOTROL) 10 MG inhaler Inhale 1 Cartridge (1 continuous puffing total) into the lungs as needed for smoking cessation. 42 each 0  . nystatin (MYCOSTATIN) 100000 UNIT/ML suspension Take 5 mLs (500,000 Units total) by mouth 4 (four) times daily. 240 mL 0  . pantoprazole (PROTONIX) 40 MG tablet Take 1 tablet (40 mg total) by mouth daily. 30 tablet 11  . simethicone (MYLICON) 80 MG chewable tablet Chew 80 mg by mouth 3 (three) times daily as needed for flatulence. Take with meals as needed    . spironolactone (ALDACTONE) 25 MG tablet Take 0.5 tablets (12.5 mg total) by mouth daily. 45 tablet 3  . Tamsulosin HCl (FLOMAX) 0.4 MG CAPS Take 0.4 mg by mouth Nightly.     . thiamine 100 MG tablet Take 1 tablet (100 mg total) by mouth daily. 30 tablet 0   No current facility-administered medications for this encounter.    BP 121/67   Pulse 76   Wt 194 lb 8 oz (88.2 kg)   SpO2 97%   BMI 26.38 kg/m    Wt Readings from Last 3 Encounters:  04/20/17 194 lb 8 oz (88.2 kg)  04/12/17 192 lb 6.4 oz (87.3 kg)  04/06/17 192 lb (87.1 kg)    Physical Exam  General: Elderly. NAD. No resp difficulty.  Psych: Normal  affect. HEENT: Normal, without mass or lesion. Neck: JVP 8-9 cm. Carotids 2+. No lymphadenopathy/thyromegaly appreciated. Heart: PMI nondisplaced. Irregular S2S2, no s3, s4, or murmurs. Lungs: Distant breath sounds Abdomen: Soft, non-tender, non-distended, No HSM, BS + x 4.  Extremities: No clubbing or cyanosis. DP/PT/Radials 2+ and equal bilaterally. 1+ edema 1/3 to knees bilaterally.   Neuro: Alert and oriented X 3. Moves all extremities spontaneously.  Assessment/Plan: 1. CAD:  No chest pain. Cardiolite 10/17 with infarct, no ischemia.  - No ASA with stable CAD and Eliquis.  - Continue statin.  2. Chronic systolic CHF: EF 24-19% on 5/17 echo, which is down from 40-45% in 8/15. Suspect ischemic cardiomyopathy with possible contribution from ETOH.  NYHA class II-III.  Weight is up, mild volume overload on exam.  - Increase Lasix to 40 qam, 20 qpm.  BMET today and again in 10 days.   - Continue hydralazine 37.5 mg tid and increase Imdur to 60 mg daily.   - Continue Toprol XL 50 daily.  - Continue spironolactone 12.5 mg daily.   - Hold off on ACEI/ARB/ARNI for now with elevated creatinine. 3. PAD: No claudication reported. Would avoid cilostazol given CHF.  4. CKD: Stage III.  BMET today.  5. ETOH abuse: Patient continues to drink heavily.   - Pt not interested in cessation or cutting back. This is likely contributed to his CMP.  6. Smoking: He has a quit date for the end of the month.  7. Atrial fibrillation:  Chronic. No recent GI bleeding.  - Continue Eliquis 5 mg BID.  Check CBC.   Followup 6 weeks.   Loralie Champagne, MD  04/21/2017

## 2017-04-28 ENCOUNTER — Other Ambulatory Visit (HOSPITAL_COMMUNITY): Payer: Self-pay

## 2017-04-28 ENCOUNTER — Telehealth (HOSPITAL_COMMUNITY): Payer: Self-pay | Admitting: *Deleted

## 2017-04-28 NOTE — Telephone Encounter (Signed)
Take Lasix 60 qam/40 qpm x 3 days then back to 40 qam/20 qpm.

## 2017-04-28 NOTE — Telephone Encounter (Signed)
Katie left VM on triage line earlier to report pt's wt is up 7 lbs overnight, she states last week he was 192, yesterday 194 and today he was at 201 lb.  She states he does have LE edema, only slightly worse than pt's baseline, denies sob, pt did miss 1 day of meds.  I called Katie back and spoke w/her, she states pt was stable, just increased wt, she states he has been taking correct meds including Lasix 40/20 and only missed 1 day in past week.  Will send to Dr Aundra Dubin for review and call Katie tomorrow w/recommendations.

## 2017-04-28 NOTE — Progress Notes (Signed)
Paramedicine Encounter    Patient ID: [name], male    DOB: [birth year], 81 y.o.   MRN: [id number]   Patient Care Team: Beacher May, MD as PCP - General (Internal Medicine)  Patient Active Problem List   Diagnosis Date Noted  . Chronic systolic CHF (congestive heart failure) ([place]) 10/29/2016  . Bipolar disorder ([place])   . Alcohol abuse 07/09/2016  . Demand ischemia ([place]) 05/11/2016  . Chronic anticoagulation-Eliquis 05/11/2016  . Elevated troponin 05/11/2016  . Symptomatic anemia 05/11/2016  . Anemia 11/28/2015  . CVA (cerebral infarction)-Augb 2015 08/25/2014  . Stroke ([place]) 08/25/2014  . CKD (chronic kidney disease), stage III 07/17/2014  . Tobacco abuse 06/27/2014  . Pulmonary edema   . Chronic atrial fibrillation ([place])   . Edema 08/20/2011  . DM (diabetes mellitus), type 2 with renal complications ([place]) 62/95/2841  . Hyperlipidemia associated with type 2 diabetes mellitus ([place]) 03/10/2010  . Essential hypertension 03/10/2010  . MYOCARDIAL INFARCTION 03/10/2010  . CAD- last PCI 2011 03/10/2010  . PVD 03/10/2010  . GERD 03/10/2010    Current Outpatient Prescriptions:  .  albuterol (PROVENTIL HFA;VENTOLIN HFA) 108 (90 BASE) MCG/ACT inhaler, Inhale 2 puffs into the lungs every 6 (six) hours as needed for shortness of breath., Disp: , Rfl:  .  ammonium lactate (LAC-HYDRIN) 12 % lotion, Apply 1 application topically daily., Disp: , Rfl:  .  apixaban (ELIQUIS) 5 MG TABS tablet, Take 1 tablet (5 mg total) by mouth every 12 (twelve) hours., Disp: 60 tablet, Rfl: 3 .  atorvastatin (LIPITOR) 40 MG tablet, Take 1 tablet (40 mg total) by mouth daily. (Patient taking differently: Take 40 mg by mouth at bedtime. ), Disp: 90 tablet, Rfl: 3 .  dextrose (GLUTOSE) 40 % GEL, Take 1 Tube by mouth daily as needed for low blood sugar. , Disp: , Rfl:  .  feeding supplement, GLUCERNA SHAKE, (GLUCERNA SHAKE) LIQD, Take 237 mLs by mouth 3 (three) times daily between meals., Disp: , Rfl:   .  ferrous sulfate 325 (65 FE) MG tablet, Take 325 mg by mouth 2 (two) times daily., Disp: , Rfl:  .  finasteride (PROSCAR) 5 MG tablet, Take 5 mg by mouth daily.  , Disp: , Rfl:  .  folic acid (FOLVITE) 1 MG tablet, Take 1 tablet (1 mg total) by mouth daily., Disp: 30 tablet, Rfl: 0 .  furosemide (LASIX) 20 MG tablet, Take 40 mg (2 Tablets) the AM and 20 mg (1 Tablet) in the PM, Disp: 90 tablet, Rfl: 3 .  gabapentin (NEURONTIN) 300 MG capsule, Take 300 mg by mouth 2 (two) times daily. , Disp: , Rfl:  .  hydrALAZINE (APRESOLINE) 25 MG tablet, Take 1.5 tablets (37.5 mg total) by mouth 3 (three) times daily., Disp: 90 tablet, Rfl: 6 .  insulin glargine (LANTUS) 100 UNIT/ML injection, Inject 40 Units into the skin daily., Disp: , Rfl:  .  isosorbide mononitrate (IMDUR) 30 MG 24 hr tablet, Take 2 tablets (60 mg total) by mouth daily., Disp: 60 tablet, Rfl: 3 .  metoprolol succinate (TOPROL-XL) 50 MG 24 hr tablet, Take 1 tablet (50 mg total) by mouth daily., Disp: 30 tablet, Rfl: 3 .  Multiple Vitamins-Minerals (MULTIVITAMIN WITH MINERALS) tablet, Take 1 tablet by mouth daily., Disp: , Rfl:  .  nicotine (NICOTROL) 10 MG inhaler, Inhale 1 Cartridge (1 continuous puffing total) into the lungs as needed for smoking cessation., Disp: 42 each, Rfl: 0 .  nystatin (MYCOSTATIN) 100000 UNIT/ML suspension, Take 5 mLs (  500,000 Units total) by mouth 4 (four) times daily., Disp: 240 mL, Rfl: 0 .  pantoprazole (PROTONIX) 40 MG tablet, Take 1 tablet (40 mg total) by mouth daily., Disp: 30 tablet, Rfl: 11 .  simethicone (MYLICON) 80 MG chewable tablet, Chew 80 mg by mouth 3 (three) times daily as needed for flatulence. Take with meals as needed, Disp: , Rfl:  .  spironolactone (ALDACTONE) 25 MG tablet, Take 0.5 tablets (12.5 mg total) by mouth daily., Disp: 45 tablet, Rfl: 3 .  Tamsulosin HCl (FLOMAX) 0.4 MG CAPS, Take 0.4 mg by mouth Nightly. , Disp: , Rfl:  .  thiamine 100 MG tablet, Take 1 tablet (100 mg total) by  mouth daily., Disp: 30 tablet, Rfl: 0 .  acetaminophen (TYLENOL) 325 MG tablet, Take 650 mg by mouth every 8 (eight) hours as needed for mild pain or fever (pain). , Disp: , Rfl:  .  guaifenesin (HUMIBID E) 400 MG TABS tablet, Take 400 mg by mouth 3 (three) times daily as needed. For congestion, Disp: , Rfl:  .  insulin regular (NOVOLIN R,HUMULIN R) 100 units/mL injection, Inject 5 Units into the skin See admin instructions. Inject 5 units under the skin before breakfast and inject 5 units before evening meal, Disp: , Rfl:  Allergies  Allergen Reactions  . Penicillins Swelling    Has patient had a PCN reaction causing immediate rash, facial/tongue/throat swelling, SOB or lightheadedness with hypotension: Yes Has patient had a PCN reaction causing severe rash involving mucus membranes or skin necrosis: No Has patient had a PCN reaction that required hospitalization: No Has patient had a PCN reaction occurring within the last 10 years: No If all of the above answers are "NO", then may proceed with Cephalosporin use.     Social History   Social History  . Marital status: Divorced    Spouse name: N/A  . Number of children: N/A  . Years of education: N/A   Occupational History  . retired Architect    Social History Main Topics  . Smoking status: Current Every Day Smoker    Packs/day: 0.50    Years: 50.00    Types: Cigarettes  . Smokeless tobacco: Never Used     Comment: He has a 50-pack-year hx of tobacco abuse. Currently, smoking about half a pack a day.  . Alcohol use Yes     Comment: Previously drank heavily, and now has a drink every other day or so.  . Drug use: Yes    Types: Marijuana, Cocaine  . Sexual activity: No   Other Topics Concern  . Not on file   Social History Narrative  . No narrative on file    Physical Exam      Future Appointments Date Time Provider [place]  04/30/2017 2:00 PM [facility] None  06/18/2017 2:00 PM [facility] CL[facility]  [facility] None   BP 140/60   Pulse 60   Resp 15   Wt 201 lb (91.2 kg)   SpO2 96%   BMI 27.26 kg/m  Weight yesterday-194 Last visit weight-192 CBG-123  Pt reports [facility] went [facility] [place] yesterday,  The doc there advised they have not received any pt records from [facility]. He is going up there Friday for labs and will ask them to fax it over then.  When doing his pill box today he told me he had not taken any meds this morning, so as I was laying out the dose for him today  so his pill box can be full he began to take them, and then he said he already took his morning dose of meds--which the extra ones he took was eliquis, folic acid, and iron pill-contacted erika to let her know and she advised for him to skip the evening pills that he already took and resume tomor his normal dosing and to take it easy for rest of day.  meds verified and pill box refilled.  His weight is up 7 lbs overnight. He states his urinating has decreased. He did miss a day of pills. He has no increased sob. No dizziness. Still smoking, eating poorly, he states he will not stop using his salt, he wants to enjoy his foods. He was out yesterday doing a lot of walking during his appointment at [facility] and he has increased swelling to his left foot. Which that has been going on for a few wks. Left a message on triage line about pts weight gain and inreased swelling.   ACTION: Home visit completed Next visit planned for next week   Marylouise Stacks, EMT-Paramedic 04/28/17

## 2017-04-29 ENCOUNTER — Other Ambulatory Visit (HOSPITAL_COMMUNITY): Payer: Self-pay

## 2017-04-29 NOTE — Progress Notes (Signed)
Paramedicine Encounter    Patient ID: [name], male    DOB: [birth year], 81 y.o.   MRN: [id number]   Patient Care Team: Beacher May, MD as PCP - General (Internal Medicine)  Patient Active Problem List   Diagnosis Date Noted  . Chronic systolic CHF (congestive heart failure) ([place]) 10/29/2016  . Bipolar disorder ([place])   . Alcohol abuse 07/09/2016  . Demand ischemia ([place]) 05/11/2016  . Chronic anticoagulation-Eliquis 05/11/2016  . Elevated troponin 05/11/2016  . Symptomatic anemia 05/11/2016  . Anemia 11/28/2015  . CVA (cerebral infarction)-Augb 2015 08/25/2014  . Stroke ([place]) 08/25/2014  . CKD (chronic kidney disease), stage III 07/17/2014  . Tobacco abuse 06/27/2014  . Pulmonary edema   . Chronic atrial fibrillation ([place])   . Edema 08/20/2011  . DM (diabetes mellitus), type 2 with renal complications ([place]) 35/32/9924  . Hyperlipidemia associated with type 2 diabetes mellitus ([place]) 03/10/2010  . Essential hypertension 03/10/2010  . MYOCARDIAL INFARCTION 03/10/2010  . CAD- last PCI 2011 03/10/2010  . PVD 03/10/2010  . GERD 03/10/2010    Current Outpatient Prescriptions:  .  acetaminophen (TYLENOL) 325 MG tablet, Take 650 mg by mouth every 8 (eight) hours as needed for mild pain or fever (pain). , Disp: , Rfl:  .  albuterol (PROVENTIL HFA;VENTOLIN HFA) 108 (90 BASE) MCG/ACT inhaler, Inhale 2 puffs into the lungs every 6 (six) hours as needed for shortness of breath., Disp: , Rfl:  .  ammonium lactate (LAC-HYDRIN) 12 % lotion, Apply 1 application topically daily., Disp: , Rfl:  .  apixaban (ELIQUIS) 5 MG TABS tablet, Take 1 tablet (5 mg total) by mouth every 12 (twelve) hours., Disp: 60 tablet, Rfl: 3 .  atorvastatin (LIPITOR) 40 MG tablet, Take 1 tablet (40 mg total) by mouth daily. (Patient taking differently: Take 40 mg by mouth at bedtime. ), Disp: 90 tablet, Rfl: 3 .  dextrose (GLUTOSE) 40 % GEL, Take 1 Tube by mouth daily as needed for low blood sugar. , Disp: ,  Rfl:  .  feeding supplement, GLUCERNA SHAKE, (GLUCERNA SHAKE) LIQD, Take 237 mLs by mouth 3 (three) times daily between meals., Disp: , Rfl:  .  ferrous sulfate 325 (65 FE) MG tablet, Take 325 mg by mouth 2 (two) times daily., Disp: , Rfl:  .  finasteride (PROSCAR) 5 MG tablet, Take 5 mg by mouth daily.  , Disp: , Rfl:  .  folic acid (FOLVITE) 1 MG tablet, Take 1 tablet (1 mg total) by mouth daily., Disp: 30 tablet, Rfl: 0 .  furosemide (LASIX) 20 MG tablet, Take 40 mg (2 Tablets) the AM and 20 mg (1 Tablet) in the PM, Disp: 90 tablet, Rfl: 3 .  gabapentin (NEURONTIN) 300 MG capsule, Take 300 mg by mouth 2 (two) times daily. , Disp: , Rfl:  .  guaifenesin (HUMIBID E) 400 MG TABS tablet, Take 400 mg by mouth 3 (three) times daily as needed. For congestion, Disp: , Rfl:  .  hydrALAZINE (APRESOLINE) 25 MG tablet, Take 1.5 tablets (37.5 mg total) by mouth 3 (three) times daily., Disp: 90 tablet, Rfl: 6 .  insulin glargine (LANTUS) 100 UNIT/ML injection, Inject 40 Units into the skin daily., Disp: , Rfl:  .  insulin regular (NOVOLIN R,HUMULIN R) 100 units/mL injection, Inject 5 Units into the skin See admin instructions. Inject 5 units under the skin before breakfast and inject 5 units before evening meal, Disp: , Rfl:  .  isosorbide mononitrate (IMDUR) 30 MG 24 hr tablet,  Take 2 tablets (60 mg total) by mouth daily., Disp: 60 tablet, Rfl: 3 .  metoprolol succinate (TOPROL-XL) 50 MG 24 hr tablet, Take 1 tablet (50 mg total) by mouth daily., Disp: 30 tablet, Rfl: 3 .  Multiple Vitamins-Minerals (MULTIVITAMIN WITH MINERALS) tablet, Take 1 tablet by mouth daily., Disp: , Rfl:  .  nicotine (NICOTROL) 10 MG inhaler, Inhale 1 Cartridge (1 continuous puffing total) into the lungs as needed for smoking cessation., Disp: 42 each, Rfl: 0 .  nystatin (MYCOSTATIN) 100000 UNIT/ML suspension, Take 5 mLs (500,000 Units total) by mouth 4 (four) times daily., Disp: 240 mL, Rfl: 0 .  pantoprazole (PROTONIX) 40 MG tablet,  Take 1 tablet (40 mg total) by mouth daily., Disp: 30 tablet, Rfl: 11 .  simethicone (MYLICON) 80 MG chewable tablet, Chew 80 mg by mouth 3 (three) times daily as needed for flatulence. Take with meals as needed, Disp: , Rfl:  .  spironolactone (ALDACTONE) 25 MG tablet, Take 0.5 tablets (12.5 mg total) by mouth daily., Disp: 45 tablet, Rfl: 3 .  Tamsulosin HCl (FLOMAX) 0.4 MG CAPS, Take 0.4 mg by mouth Nightly. , Disp: , Rfl:  .  thiamine 100 MG tablet, Take 1 tablet (100 mg total) by mouth daily., Disp: 30 tablet, Rfl: 0 Allergies  Allergen Reactions  . Penicillins Swelling    Has patient had a PCN reaction causing immediate rash, facial/tongue/throat swelling, SOB or lightheadedness with hypotension: Yes Has patient had a PCN reaction causing severe rash involving mucus membranes or skin necrosis: No Has patient had a PCN reaction that required hospitalization: No Has patient had a PCN reaction occurring within the last 10 years: No If all of the above answers are "NO", then may proceed with Cephalosporin use.     Social History   Social History  . Marital status: Divorced    Spouse name: N/A  . Number of children: N/A  . Years of education: N/A   Occupational History  . retired Architect    Social History Main Topics  . Smoking status: Current Every Day Smoker    Packs/day: 0.50    Years: 50.00    Types: Cigarettes  . Smokeless tobacco: Never Used     Comment: He has a 50-pack-year hx of tobacco abuse. Currently, smoking about half a pack a day.  . Alcohol use Yes     Comment: Previously drank heavily, and now has a drink every other day or so.  . Drug use: Yes    Types: Marijuana, Cocaine  . Sexual activity: No   Other Topics Concern  . Not on file   Social History Narrative  . No narrative on file    Physical Exam      Future Appointments Date Time Provider [place]  04/30/2017 2:00 PM [facility] None  06/18/2017 2:00 PM [facility] CL[facility]  [facility] None   BP (!) 110/52   Pulse 64   Resp 16   Wt 199 lb 12.8 oz (90.6 kg)   SpO2 95%   BMI 27.10 kg/m  Weight yesterday-201 Last visit weight-201  Came out today for med rec-heather from clinic called and per dr Aundra Dubin he is to increase his lasix to '60mg'$  in am and '40mg'$  in pm for 3 days, so I came out to fix his pill box.     ACTION: Home visit completed Next visit planned for next week   Marylouise Stacks, EMT-Paramedic 04/29/17

## 2017-04-29 NOTE — Telephone Encounter (Signed)
[name] is aware and will go out to pt's home today to adjust pill box accordingly.

## 2017-04-30 ENCOUNTER — Ambulatory Visit (HOSPITAL_COMMUNITY)
Admission: RE | Admit: 2017-04-30 | Discharge: 2017-04-30 | Disposition: A | Discharge status: Home / Self Care | Payer: Medicare Other | Source: Ambulatory Visit | Attending: Cardiology | Admitting: Cardiology

## 2017-04-30 DIAGNOSIS — I5022 Chronic systolic (congestive) heart failure: Secondary | ICD-10-CM

## 2017-04-30 LAB — BASIC METABOLIC PANEL
ANION GAP: 9 (ref 5–15)
BUN: 32 mg/dL — ABNORMAL HIGH (ref 6–20)
CO2: 28 mmol/L (ref 22–32)
Calcium: 9 mg/dL (ref 8.9–10.3)
Chloride: 100 mmol/L — ABNORMAL LOW (ref 101–111)
Creatinine, Ser: 1.97 mg/dL — ABNORMAL HIGH (ref 0.61–1.24)
GFR calc Af Amer: 36 mL/min — ABNORMAL LOW (ref >60)
GFR calc non Af Amer: 31 mL/min — ABNORMAL LOW (ref >60)
Glucose, Bld: 194 mg/dL — ABNORMAL HIGH (ref 65–99)
Potassium: 4 mmol/L (ref 3.5–5.1)
SODIUM: 137 mmol/L (ref 135–145)

## 2017-05-05 ENCOUNTER — Other Ambulatory Visit (HOSPITAL_COMMUNITY): Payer: Self-pay

## 2017-05-05 NOTE — Progress Notes (Signed)
Paramedicine Encounter    Patient ID: [name], male    DOB: Apr 28, 1936, 81 y.o.   MRN: [id number]   Patient Care Team: Beacher May, MD as PCP - General (Internal Medicine)  Patient Active Problem List   Diagnosis Date Noted  . Chronic systolic CHF (congestive heart failure) ([place]) 10/29/2016  . Bipolar disorder ([place])   . Alcohol abuse 07/09/2016  . Demand ischemia ([place]) 05/11/2016  . Chronic anticoagulation-Eliquis 05/11/2016  . Elevated troponin 05/11/2016  . Symptomatic anemia 05/11/2016  . Anemia 11/28/2015  . CVA (cerebral infarction)-Augb 2015 08/25/2014  . Stroke ([place]) 08/25/2014  . CKD (chronic kidney disease), stage III 07/17/2014  . Tobacco abuse 06/27/2014  . Pulmonary edema   . Chronic atrial fibrillation ([place])   . Edema 08/20/2011  . DM (diabetes mellitus), type 2 with renal complications ([place]) 40/98/1191  . Hyperlipidemia associated with type 2 diabetes mellitus ([place]) 03/10/2010  . Essential hypertension 03/10/2010  . MYOCARDIAL INFARCTION 03/10/2010  . CAD- last PCI 2011 03/10/2010  . PVD 03/10/2010  . GERD 03/10/2010    Current Outpatient Prescriptions:  .  acetaminophen (TYLENOL) 325 MG tablet, Take 650 mg by mouth every 8 (eight) hours as needed for mild pain or fever (pain). , Disp: , Rfl:  .  albuterol (PROVENTIL HFA;VENTOLIN HFA) 108 (90 BASE) MCG/ACT inhaler, Inhale 2 puffs into the lungs every 6 (six) hours as needed for shortness of breath., Disp: , Rfl:  .  ammonium lactate (LAC-HYDRIN) 12 % lotion, Apply 1 application topically daily., Disp: , Rfl:  .  apixaban (ELIQUIS) 5 MG TABS tablet, Take 1 tablet (5 mg total) by mouth every 12 (twelve) hours., Disp: 60 tablet, Rfl: 3 .  atorvastatin (LIPITOR) 40 MG tablet, Take 1 tablet (40 mg total) by mouth daily. (Patient taking differently: Take 40 mg by mouth at bedtime. ), Disp: 90 tablet, Rfl: 3 .  dextrose (GLUTOSE) 40 % GEL, Take 1 Tube by mouth daily as needed for low blood sugar. , Disp: ,  Rfl:  .  feeding supplement, GLUCERNA SHAKE, (GLUCERNA SHAKE) LIQD, Take 237 mLs by mouth 3 (three) times daily between meals., Disp: , Rfl:  .  ferrous sulfate 325 (65 FE) MG tablet, Take 325 mg by mouth 2 (two) times daily., Disp: , Rfl:  .  finasteride (PROSCAR) 5 MG tablet, Take 5 mg by mouth daily.  , Disp: , Rfl:  .  folic acid (FOLVITE) 1 MG tablet, Take 1 tablet (1 mg total) by mouth daily., Disp: 30 tablet, Rfl: 0 .  furosemide (LASIX) 20 MG tablet, Take 40 mg (2 Tablets) the AM and 20 mg (1 Tablet) in the PM, Disp: 90 tablet, Rfl: 3 .  gabapentin (NEURONTIN) 300 MG capsule, Take 300 mg by mouth 2 (two) times daily. , Disp: , Rfl:  .  hydrALAZINE (APRESOLINE) 25 MG tablet, Take 1.5 tablets (37.5 mg total) by mouth 3 (three) times daily., Disp: 90 tablet, Rfl: 6 .  insulin glargine (LANTUS) 100 UNIT/ML injection, Inject 40 Units into the skin daily., Disp: , Rfl:  .  insulin regular (NOVOLIN R,HUMULIN R) 100 units/mL injection, Inject 5 Units into the skin See admin instructions. Inject 5 units under the skin before breakfast and inject 5 units before evening meal, Disp: , Rfl:  .  isosorbide mononitrate (IMDUR) 30 MG 24 hr tablet, Take 2 tablets (60 mg total) by mouth daily., Disp: 60 tablet, Rfl: 3 .  metoprolol succinate (TOPROL-XL) 50 MG 24 hr tablet, Take 1  tablet (50 mg total) by mouth daily., Disp: 30 tablet, Rfl: 3 .  Multiple Vitamins-Minerals (MULTIVITAMIN WITH MINERALS) tablet, Take 1 tablet by mouth daily., Disp: , Rfl:  .  nicotine (NICOTROL) 10 MG inhaler, Inhale 1 Cartridge (1 continuous puffing total) into the lungs as needed for smoking cessation., Disp: 42 each, Rfl: 0 .  nystatin (MYCOSTATIN) 100000 UNIT/ML suspension, Take 5 mLs (500,000 Units total) by mouth 4 (four) times daily., Disp: 240 mL, Rfl: 0 .  pantoprazole (PROTONIX) 40 MG tablet, Take 1 tablet (40 mg total) by mouth daily., Disp: 30 tablet, Rfl: 11 .  spironolactone (ALDACTONE) 25 MG tablet, Take 0.5 tablets  (12.5 mg total) by mouth daily., Disp: 45 tablet, Rfl: 3 .  Tamsulosin HCl (FLOMAX) 0.4 MG CAPS, Take 0.4 mg by mouth Nightly. , Disp: , Rfl:  .  thiamine 100 MG tablet, Take 1 tablet (100 mg total) by mouth daily., Disp: 30 tablet, Rfl: 0 .  guaifenesin (HUMIBID E) 400 MG TABS tablet, Take 400 mg by mouth 3 (three) times daily as needed. For congestion, Disp: , Rfl:  .  simethicone (MYLICON) 80 MG chewable tablet, Chew 80 mg by mouth 3 (three) times daily as needed for flatulence. Take with meals as needed, Disp: , Rfl:  Allergies  Allergen Reactions  . Penicillins Swelling    Has patient had a PCN reaction causing immediate rash, facial/tongue/throat swelling, SOB or lightheadedness with hypotension: Yes Has patient had a PCN reaction causing severe rash involving mucus membranes or skin necrosis: No Has patient had a PCN reaction that required hospitalization: No Has patient had a PCN reaction occurring within the last 10 years: No If all of the above answers are "NO", then may proceed with Cephalosporin use.     Social History   Social History  . Marital status: Divorced    Spouse name: N/A  . Number of children: N/A  . Years of education: N/A   Occupational History  . retired Architect    Social History Main Topics  . Smoking status: Current Every Day Smoker    Packs/day: 0.50    Years: 50.00    Types: Cigarettes  . Smokeless tobacco: Never Used     Comment: He has a 50-pack-year hx of tobacco abuse. Currently, smoking about half a pack a day.  . Alcohol use Yes     Comment: Previously drank heavily, and now has a drink every other day or so.  . Drug use: Yes    Types: Marijuana, Cocaine  . Sexual activity: No   Other Topics Concern  . Not on file   Social History Narrative  . No narrative on file    Physical Exam  Constitutional: He is oriented to person, place, and time.  Pulmonary/Chest: Effort normal and breath sounds normal. No respiratory distress. He has  no wheezes. He has no rales.  Abdominal: Soft.  Musculoskeletal: He exhibits no edema.  Neurological: He is oriented to person, place, and time.  Skin: Skin is warm and dry.        Future Appointments Date Time Provider [place]  06/18/2017 2:00 PM [facility] None   BP 128/80   Pulse 68   Resp 16   Wt 193 lb 3.2 oz (87.6 kg)   SpO2 98%   BMI 26.20 kg/m  Weight yesterday-190 Last visit weight-199 CBG-144  Pt had doc appointment in [place] this morning, he reports he isnt sure how he is doing today--he states they  found some nodules behind his eyes and they stuck a needle in his eye and put some fluid back there-now the numbing is wearing off his eye is getting sore.  it appears he missed this morning dose of meds. He took the afternoon pills during our visit  meds verified and pill box refilled.  Pt still smoking and drinking ETOH. We have talked numerous times about him stopping-he keeps telling me he plans on it but something always seem to come up to where he feels like he needs to smoke as it eases his anxiety.  His weight is up 3lbs from yesterday however he weighed during our visit at 430pm vs his normal weighing in the AM plus his clothes on also, but his weight is down from last week. He forgot to weigh this morning since he had to leave early this morn for his appointment. Pt denies any sob, no dizziness, no h/a. No other complaints except for the eye irritation.   ACTION: Home visit completed Next visit planned for next week   Marylouise Stacks, EMT-Paramedic 05/05/17

## 2017-05-12 ENCOUNTER — Other Ambulatory Visit (HOSPITAL_COMMUNITY): Payer: Self-pay

## 2017-05-12 NOTE — Progress Notes (Signed)
Paramedicine Encounter    Patient ID: [name], male    DOB: Apr 28, 1936, 81 y.o.   MRN: [id number]   Patient Care Team: Beacher May, MD as PCP - General (Internal Medicine)  Patient Active Problem List   Diagnosis Date Noted  . Chronic systolic CHF (congestive heart failure) ([place]) 10/29/2016  . Bipolar disorder ([place])   . Alcohol abuse 07/09/2016  . Demand ischemia ([place]) 05/11/2016  . Chronic anticoagulation-Eliquis 05/11/2016  . Elevated troponin 05/11/2016  . Symptomatic anemia 05/11/2016  . Anemia 11/28/2015  . CVA (cerebral infarction)-Augb 2015 08/25/2014  . Stroke ([place]) 08/25/2014  . CKD (chronic kidney disease), stage III 07/17/2014  . Tobacco abuse 06/27/2014  . Pulmonary edema   . Chronic atrial fibrillation ([place])   . Edema 08/20/2011  . DM (diabetes mellitus), type 2 with renal complications ([place]) 40/98/1191  . Hyperlipidemia associated with type 2 diabetes mellitus ([place]) 03/10/2010  . Essential hypertension 03/10/2010  . MYOCARDIAL INFARCTION 03/10/2010  . CAD- last PCI 2011 03/10/2010  . PVD 03/10/2010  . GERD 03/10/2010    Current Outpatient Prescriptions:  .  acetaminophen (TYLENOL) 325 MG tablet, Take 650 mg by mouth every 8 (eight) hours as needed for mild pain or fever (pain). , Disp: , Rfl:  .  albuterol (PROVENTIL HFA;VENTOLIN HFA) 108 (90 BASE) MCG/ACT inhaler, Inhale 2 puffs into the lungs every 6 (six) hours as needed for shortness of breath., Disp: , Rfl:  .  ammonium lactate (LAC-HYDRIN) 12 % lotion, Apply 1 application topically daily., Disp: , Rfl:  .  apixaban (ELIQUIS) 5 MG TABS tablet, Take 1 tablet (5 mg total) by mouth every 12 (twelve) hours., Disp: 60 tablet, Rfl: 3 .  atorvastatin (LIPITOR) 40 MG tablet, Take 1 tablet (40 mg total) by mouth daily. (Patient taking differently: Take 40 mg by mouth at bedtime. ), Disp: 90 tablet, Rfl: 3 .  dextrose (GLUTOSE) 40 % GEL, Take 1 Tube by mouth daily as needed for low blood sugar. , Disp: ,  Rfl:  .  feeding supplement, GLUCERNA SHAKE, (GLUCERNA SHAKE) LIQD, Take 237 mLs by mouth 3 (three) times daily between meals., Disp: , Rfl:  .  ferrous sulfate 325 (65 FE) MG tablet, Take 325 mg by mouth 2 (two) times daily., Disp: , Rfl:  .  finasteride (PROSCAR) 5 MG tablet, Take 5 mg by mouth daily.  , Disp: , Rfl:  .  folic acid (FOLVITE) 1 MG tablet, Take 1 tablet (1 mg total) by mouth daily., Disp: 30 tablet, Rfl: 0 .  furosemide (LASIX) 20 MG tablet, Take 40 mg (2 Tablets) the AM and 20 mg (1 Tablet) in the PM, Disp: 90 tablet, Rfl: 3 .  gabapentin (NEURONTIN) 300 MG capsule, Take 300 mg by mouth 2 (two) times daily. , Disp: , Rfl:  .  hydrALAZINE (APRESOLINE) 25 MG tablet, Take 1.5 tablets (37.5 mg total) by mouth 3 (three) times daily., Disp: 90 tablet, Rfl: 6 .  insulin glargine (LANTUS) 100 UNIT/ML injection, Inject 40 Units into the skin daily., Disp: , Rfl:  .  insulin regular (NOVOLIN R,HUMULIN R) 100 units/mL injection, Inject 5 Units into the skin See admin instructions. Inject 5 units under the skin before breakfast and inject 5 units before evening meal, Disp: , Rfl:  .  isosorbide mononitrate (IMDUR) 30 MG 24 hr tablet, Take 2 tablets (60 mg total) by mouth daily., Disp: 60 tablet, Rfl: 3 .  metoprolol succinate (TOPROL-XL) 50 MG 24 hr tablet, Take 1  tablet (50 mg total) by mouth daily., Disp: 30 tablet, Rfl: 3 .  Multiple Vitamins-Minerals (MULTIVITAMIN WITH MINERALS) tablet, Take 1 tablet by mouth daily., Disp: , Rfl:  .  nicotine (NICOTROL) 10 MG inhaler, Inhale 1 Cartridge (1 continuous puffing total) into the lungs as needed for smoking cessation., Disp: 42 each, Rfl: 0 .  nystatin (MYCOSTATIN) 100000 UNIT/ML suspension, Take 5 mLs (500,000 Units total) by mouth 4 (four) times daily., Disp: 240 mL, Rfl: 0 .  pantoprazole (PROTONIX) 40 MG tablet, Take 1 tablet (40 mg total) by mouth daily., Disp: 30 tablet, Rfl: 11 .  spironolactone (ALDACTONE) 25 MG tablet, Take 0.5 tablets  (12.5 mg total) by mouth daily., Disp: 45 tablet, Rfl: 3 .  Tamsulosin HCl (FLOMAX) 0.4 MG CAPS, Take 0.4 mg by mouth Nightly. , Disp: , Rfl:  .  thiamine 100 MG tablet, Take 1 tablet (100 mg total) by mouth daily., Disp: 30 tablet, Rfl: 0 .  guaifenesin (HUMIBID E) 400 MG TABS tablet, Take 400 mg by mouth 3 (three) times daily as needed. For congestion, Disp: , Rfl:  .  simethicone (MYLICON) 80 MG chewable tablet, Chew 80 mg by mouth 3 (three) times daily as needed for flatulence. Take with meals as needed, Disp: , Rfl:  Allergies  Allergen Reactions  . Penicillins Swelling    Has patient had a PCN reaction causing immediate rash, facial/tongue/throat swelling, SOB or lightheadedness with hypotension: Yes Has patient had a PCN reaction causing severe rash involving mucus membranes or skin necrosis: No Has patient had a PCN reaction that required hospitalization: No Has patient had a PCN reaction occurring within the last 10 years: No If all of the above answers are "NO", then may proceed with Cephalosporin use.     Social History   Social History  . Marital status: Divorced    Spouse name: N/A  . Number of children: N/A  . Years of education: N/A   Occupational History  . retired Architect    Social History Main Topics  . Smoking status: Current Every Day Smoker    Packs/day: 0.50    Years: 50.00    Types: Cigarettes  . Smokeless tobacco: Never Used     Comment: He has a 50-pack-year hx of tobacco abuse. Currently, smoking about half a pack a day.  . Alcohol use Yes     Comment: Previously drank heavily, and now has a drink every other day or so.  . Drug use: Yes    Types: Marijuana, Cocaine  . Sexual activity: No   Other Topics Concern  . Not on file   Social History Narrative  . No narrative on file    Physical Exam      Future Appointments Date Time Provider [place]  06/18/2017 2:00 PM [facility] None   BP 114/60   Pulse 64   Resp  16   Wt 194 lb (88 kg)   SpO2 97%   BMI 26.31 kg/m  Weight yesterday-191 Last visit weight-193 CBG PTA-370 CBG EMS-228  Pt reports he is doing well. He did miss yesterday pm dose of meds. He denies any dizziness, no sob, no h/a.  He still continues to drink ETOH, smoke cigarettes, been saying he will quit soon but he has been saying that for months now. His weight is up 3lbs overnight. His CBG was elevated this morning-he report he ate, took insulin and then ate again and took in quite a bit of candy. He  also still continues to use a lot of salt on his foods and eats food high in sodium. He has 2 large bags of potato chips. He reports he has not taken his 2nd dose of lasix today yet. Spoke with Jazmin at clinic and she reports to just have him take his regular dose of meds today and report what his weight is in the morning.  He reports since he just ate lunch then he will take another dose of novolin. Advised him to check his CBG again in about 30-105mn and if dropping too low then he needs to eat something else. Called in refill for spiro at [facility].   ACTION: Home visit completed Next visit planned for next time   KMarylouise Stacks EMT-Paramedic 05/12/17

## 2017-05-19 ENCOUNTER — Other Ambulatory Visit (HOSPITAL_COMMUNITY): Payer: Self-pay

## 2017-05-19 NOTE — Progress Notes (Signed)
Paramedicine Encounter    Patient ID: [name], male    DOB: [birth year], 81 y.o.   MRN: [id number]   Patient Care Team: Beacher May, MD as PCP - General (Internal Medicine)  Patient Active Problem List   Diagnosis Date Noted  . Chronic systolic CHF (congestive heart failure) ([place]) 10/29/2016  . Bipolar disorder ([place])   . Alcohol abuse 07/09/2016  . Demand ischemia ([place]) 05/11/2016  . Chronic anticoagulation-Eliquis 05/11/2016  . Elevated troponin 05/11/2016  . Symptomatic anemia 05/11/2016  . Anemia 11/28/2015  . CVA (cerebral infarction)-Augb 2015 08/25/2014  . Stroke ([place]) 08/25/2014  . CKD (chronic kidney disease), stage III 07/17/2014  . Tobacco abuse 06/27/2014  . Pulmonary edema   . Chronic atrial fibrillation ([place])   . Edema 08/20/2011  . DM (diabetes mellitus), type 2 with renal complications ([place]) 95/08/3266  . Hyperlipidemia associated with type 2 diabetes mellitus ([place]) 03/10/2010  . Essential hypertension 03/10/2010  . MYOCARDIAL INFARCTION 03/10/2010  . CAD- last PCI 2011 03/10/2010  . PVD 03/10/2010  . GERD 03/10/2010    Current Outpatient Prescriptions:  .  acetaminophen (TYLENOL) 325 MG tablet, Take 650 mg by mouth every 8 (eight) hours as needed for mild pain or fever (pain). , Disp: , Rfl:  .  albuterol (PROVENTIL HFA;VENTOLIN HFA) 108 (90 BASE) MCG/ACT inhaler, Inhale 2 puffs into the lungs every 6 (six) hours as needed for shortness of breath., Disp: , Rfl:  .  ammonium lactate (LAC-HYDRIN) 12 % lotion, Apply 1 application topically daily., Disp: , Rfl:  .  apixaban (ELIQUIS) 5 MG TABS tablet, Take 1 tablet (5 mg total) by mouth every 12 (twelve) hours., Disp: 60 tablet, Rfl: 3 .  atorvastatin (LIPITOR) 40 MG tablet, Take 1 tablet (40 mg total) by mouth daily. (Patient taking differently: Take 40 mg by mouth at bedtime. ), Disp: 90 tablet, Rfl: 3 .  dextrose (GLUTOSE) 40 % GEL, Take 1 Tube by mouth daily as needed for low blood sugar. , Disp: ,  Rfl:  .  feeding supplement, GLUCERNA SHAKE, (GLUCERNA SHAKE) LIQD, Take 237 mLs by mouth 3 (three) times daily between meals., Disp: , Rfl:  .  ferrous sulfate 325 (65 FE) MG tablet, Take 325 mg by mouth 2 (two) times daily., Disp: , Rfl:  .  finasteride (PROSCAR) 5 MG tablet, Take 5 mg by mouth daily.  , Disp: , Rfl:  .  folic acid (FOLVITE) 1 MG tablet, Take 1 tablet (1 mg total) by mouth daily., Disp: 30 tablet, Rfl: 0 .  furosemide (LASIX) 20 MG tablet, Take 40 mg (2 Tablets) the AM and 20 mg (1 Tablet) in the PM, Disp: 90 tablet, Rfl: 3 .  gabapentin (NEURONTIN) 300 MG capsule, Take 300 mg by mouth 2 (two) times daily. , Disp: , Rfl:  .  hydrALAZINE (APRESOLINE) 25 MG tablet, Take 1.5 tablets (37.5 mg total) by mouth 3 (three) times daily., Disp: 90 tablet, Rfl: 6 .  insulin glargine (LANTUS) 100 UNIT/ML injection, Inject 40 Units into the skin daily., Disp: , Rfl:  .  insulin regular (NOVOLIN R,HUMULIN R) 100 units/mL injection, Inject 5 Units into the skin See admin instructions. Inject 5 units under the skin before breakfast and inject 5 units before evening meal, Disp: , Rfl:  .  isosorbide mononitrate (IMDUR) 30 MG 24 hr tablet, Take 2 tablets (60 mg total) by mouth daily., Disp: 60 tablet, Rfl: 3 .  metoprolol succinate (TOPROL-XL) 50 MG 24 hr tablet, Take 1  tablet (50 mg total) by mouth daily., Disp: 30 tablet, Rfl: 3 .  Multiple Vitamins-Minerals (MULTIVITAMIN WITH MINERALS) tablet, Take 1 tablet by mouth daily., Disp: , Rfl:  .  nicotine (NICOTROL) 10 MG inhaler, Inhale 1 Cartridge (1 continuous puffing total) into the lungs as needed for smoking cessation., Disp: 42 each, Rfl: 0 .  nystatin (MYCOSTATIN) 100000 UNIT/ML suspension, Take 5 mLs (500,000 Units total) by mouth 4 (four) times daily., Disp: 240 mL, Rfl: 0 .  pantoprazole (PROTONIX) 40 MG tablet, Take 1 tablet (40 mg total) by mouth daily., Disp: 30 tablet, Rfl: 11 .  spironolactone (ALDACTONE) 25 MG tablet, Take 0.5 tablets  (12.5 mg total) by mouth daily., Disp: 45 tablet, Rfl: 3 .  Tamsulosin HCl (FLOMAX) 0.4 MG CAPS, Take 0.4 mg by mouth Nightly. , Disp: , Rfl:  .  thiamine 100 MG tablet, Take 1 tablet (100 mg total) by mouth daily., Disp: 30 tablet, Rfl: 0 .  guaifenesin (HUMIBID E) 400 MG TABS tablet, Take 400 mg by mouth 3 (three) times daily as needed. For congestion, Disp: , Rfl:  .  simethicone (MYLICON) 80 MG chewable tablet, Chew 80 mg by mouth 3 (three) times daily as needed for flatulence. Take with meals as needed, Disp: , Rfl:  Allergies  Allergen Reactions  . Penicillins Swelling    Has patient had a PCN reaction causing immediate rash, facial/tongue/throat swelling, SOB or lightheadedness with hypotension: Yes Has patient had a PCN reaction causing severe rash involving mucus membranes or skin necrosis: No Has patient had a PCN reaction that required hospitalization: No Has patient had a PCN reaction occurring within the last 10 years: No If all of the above answers are "NO", then may proceed with Cephalosporin use.     Social History   Social History  . Marital status: Divorced    Spouse name: N/A  . Number of children: N/A  . Years of education: N/A   Occupational History  . retired Architect    Social History Main Topics  . Smoking status: Current Every Day Smoker    Packs/day: 0.50    Years: 50.00    Types: Cigarettes  . Smokeless tobacco: Never Used     Comment: He has a 50-pack-year hx of tobacco abuse. Currently, smoking about half a pack a day.  . Alcohol use Yes     Comment: Previously drank heavily, and now has a drink every other day or so.  . Drug use: Yes    Types: Marijuana, Cocaine  . Sexual activity: No   Other Topics Concern  . Not on file   Social History Narrative  . No narrative on file    Physical Exam      Future Appointments Date Time Provider [place]  06/18/2017 2:00 PM [facility] None   BP 118/70   Pulse 66   Resp  16   Wt 189 lb 9.6 oz (86 kg)   SpO2 92%   BMI 25.71 kg/m  Weight yesterday-188.4  Last visit weight-194 [facility]-310  -he administered himself 40U of lantus and 5U of novolin, he states he slept ate and didn't take any insulin this early morning and he recently ate.   Pt reports he is feeling ok, he feels down to due to a couple of deaths close to him and got a call last night that his aunt is critically ill and is depressed about that.  He has taken all meds this week, still smoking  and drinking ETOH,  He has spiro in for thurs and fri- it is at the pharmacy ready for pick up. Will get dee or zack to come out fri to place it in his pill box. No sob, no dizziness, no h/a.  His 02 sat was 92% on RA and he recently smoked a cigarette prior to this as well. He still eats whatever he wants-uses salt shaker often.  He was talking about his diagnosis of bipolar and how he was on depakote before and he reports last week while doing "something" he felt the lows last week. He stopped taking the depakote due to him not feeling any different being on it. He is going to make appointment with his doc again and see about being put back on it.  No swelling noted.   ACTION: Home visit completed  Marylouise Stacks, EMT-Paramedic 05/19/17

## 2017-05-21 ENCOUNTER — Other Ambulatory Visit (HOSPITAL_COMMUNITY): Payer: Self-pay

## 2017-05-21 NOTE — Progress Notes (Signed)
Paramedicine Encounter    Patient ID: [name], male    DOB: 1936-01-09, 81 y.o.   MRN: [id number]    Patient Care Team: Beacher May, MD as PCP - General (Internal Medicine)  Patient Active Problem List   Diagnosis Date Noted  . Chronic systolic CHF (congestive heart failure) ([place]) 10/29/2016  . Bipolar disorder ([place])   . Alcohol abuse 07/09/2016  . Demand ischemia ([place]) 05/11/2016  . Chronic anticoagulation-Eliquis 05/11/2016  . Elevated troponin 05/11/2016  . Symptomatic anemia 05/11/2016  . Anemia 11/28/2015  . CVA (cerebral infarction)-Augb 2015 08/25/2014  . Stroke ([place]) 08/25/2014  . CKD (chronic kidney disease), stage III 07/17/2014  . Tobacco abuse 06/27/2014  . Pulmonary edema   . Chronic atrial fibrillation ([place])   . Edema 08/20/2011  . DM (diabetes mellitus), type 2 with renal complications ([place]) 75/09/2584  . Hyperlipidemia associated with type 2 diabetes mellitus ([place]) 03/10/2010  . Essential hypertension 03/10/2010  . MYOCARDIAL INFARCTION 03/10/2010  . CAD- last PCI 2011 03/10/2010  . PVD 03/10/2010  . GERD 03/10/2010    Current Outpatient Prescriptions:  .  acetaminophen (TYLENOL) 325 MG tablet, Take 650 mg by mouth every 8 (eight) hours as needed for mild pain or fever (pain). , Disp: , Rfl:  .  albuterol (PROVENTIL HFA;VENTOLIN HFA) 108 (90 BASE) MCG/ACT inhaler, Inhale 2 puffs into the lungs every 6 (six) hours as needed for shortness of breath., Disp: , Rfl:  .  ammonium lactate (LAC-HYDRIN) 12 % lotion, Apply 1 application topically daily., Disp: , Rfl:  .  apixaban (ELIQUIS) 5 MG TABS tablet, Take 1 tablet (5 mg total) by mouth every 12 (twelve) hours., Disp: 60 tablet, Rfl: 3 .  atorvastatin (LIPITOR) 40 MG tablet, Take 1 tablet (40 mg total) by mouth daily. (Patient taking differently: Take 40 mg by mouth at bedtime. ), Disp: 90 tablet, Rfl: 3 .  dextrose (GLUTOSE) 40 % GEL, Take 1 Tube by mouth daily as needed for low blood sugar. , Disp:  , Rfl:  .  feeding supplement, GLUCERNA SHAKE, (GLUCERNA SHAKE) LIQD, Take 237 mLs by mouth 3 (three) times daily between meals., Disp: , Rfl:  .  ferrous sulfate 325 (65 FE) MG tablet, Take 325 mg by mouth 2 (two) times daily., Disp: , Rfl:  .  finasteride (PROSCAR) 5 MG tablet, Take 5 mg by mouth daily.  , Disp: , Rfl:  .  folic acid (FOLVITE) 1 MG tablet, Take 1 tablet (1 mg total) by mouth daily., Disp: 30 tablet, Rfl: 0 .  furosemide (LASIX) 20 MG tablet, Take 40 mg (2 Tablets) the AM and 20 mg (1 Tablet) in the PM, Disp: 90 tablet, Rfl: 3 .  gabapentin (NEURONTIN) 300 MG capsule, Take 300 mg by mouth 2 (two) times daily. , Disp: , Rfl:  .  guaifenesin (HUMIBID E) 400 MG TABS tablet, Take 400 mg by mouth 3 (three) times daily as needed. For congestion, Disp: , Rfl:  .  hydrALAZINE (APRESOLINE) 25 MG tablet, Take 1.5 tablets (37.5 mg total) by mouth 3 (three) times daily., Disp: 90 tablet, Rfl: 6 .  insulin glargine (LANTUS) 100 UNIT/ML injection, Inject 40 Units into the skin daily., Disp: , Rfl:  .  insulin regular (NOVOLIN R,HUMULIN R) 100 units/mL injection, Inject 5 Units into the skin See admin instructions. Inject 5 units under the skin before breakfast and inject 5 units before evening meal, Disp: , Rfl:  .  isosorbide mononitrate (IMDUR) 30 MG 24 hr  tablet, Take 2 tablets (60 mg total) by mouth daily., Disp: 60 tablet, Rfl: 3 .  metoprolol succinate (TOPROL-XL) 50 MG 24 hr tablet, Take 1 tablet (50 mg total) by mouth daily., Disp: 30 tablet, Rfl: 3 .  Multiple Vitamins-Minerals (MULTIVITAMIN WITH MINERALS) tablet, Take 1 tablet by mouth daily., Disp: , Rfl:  .  nicotine (NICOTROL) 10 MG inhaler, Inhale 1 Cartridge (1 continuous puffing total) into the lungs as needed for smoking cessation., Disp: 42 each, Rfl: 0 .  nystatin (MYCOSTATIN) 100000 UNIT/ML suspension, Take 5 mLs (500,000 Units total) by mouth 4 (four) times daily., Disp: 240 mL, Rfl: 0 .  pantoprazole (PROTONIX) 40 MG tablet,  Take 1 tablet (40 mg total) by mouth daily., Disp: 30 tablet, Rfl: 11 .  simethicone (MYLICON) 80 MG chewable tablet, Chew 80 mg by mouth 3 (three) times daily as needed for flatulence. Take with meals as needed, Disp: , Rfl:  .  spironolactone (ALDACTONE) 25 MG tablet, Take 0.5 tablets (12.5 mg total) by mouth daily., Disp: 45 tablet, Rfl: 3 .  Tamsulosin HCl (FLOMAX) 0.4 MG CAPS, Take 0.4 mg by mouth Nightly. , Disp: , Rfl:  .  thiamine 100 MG tablet, Take 1 tablet (100 mg total) by mouth daily., Disp: 30 tablet, Rfl: 0 Allergies  Allergen Reactions  . Penicillins Swelling    Has patient had a PCN reaction causing immediate rash, facial/tongue/throat swelling, SOB or lightheadedness with hypotension: Yes Has patient had a PCN reaction causing severe rash involving mucus membranes or skin necrosis: No Has patient had a PCN reaction that required hospitalization: No Has patient had a PCN reaction occurring within the last 10 years: No If all of the above answers are "NO", then may proceed with Cephalosporin use.     Social History   Social History  . Marital status: Divorced    Spouse name: N/A  . Number of children: N/A  . Years of education: N/A   Occupational History  . retired Architect    Social History Main Topics  . Smoking status: Current Every Day Smoker    Packs/day: 0.50    Years: 50.00    Types: Cigarettes  . Smokeless tobacco: Never Used     Comment: He has a 50-pack-year hx of tobacco abuse. Currently, smoking about half a pack a day.  . Alcohol use Yes     Comment: Previously drank heavily, and now has a drink every other day or so.  . Drug use: Yes    Types: Marijuana, Cocaine  . Sexual activity: No   Other Topics Concern  . Not on file   Social History Narrative  . No narrative on file    Physical Exam  Pulmonary/Chest: No respiratory distress. He has no wheezes. He has no rales.  Abdominal: He exhibits no distension.  Musculoskeletal: He exhibits  no edema.  Skin: He is not diaphoretic.        Future Appointments Date Time Provider [place]  06/18/2017 2:00 PM [facility] None    ATF pt CAO x4 with no complaints.  Today is a revisit due to pt being out of spirolactone after Friday.  His box is now complete. Pt informed me that he has been diagnosed bipolar and will see a psychiatrist on 06/03/17.  He also talked a lot about family and friends that has passed away.  He stated that he is "ok" and is currently learning more about the illness and watching for "signs". Pt's aid  is gone for the day.  Pt was smoking cigs prior to arrival and alcohol.  His vitals noted; pt denies sob and dizziness.  He had taken insulin prior to my arrival and took 5 extra units after I checked his CBG. His apartment was hot but he didn't want to turn on the [facility].  BP 110/88 (BP Location: Right Arm, Patient Position: Sitting, Cuff Size: Normal)   Pulse 85   Resp 16   Wt 188 lb (85.3 kg)   SpO2 96%   BMI 25.50 kg/m   cbg 218 Weight yesterday-188 Last visit weight-189    Javares Kaufhold, EMT Paramedic 05/21/2017    ACTION: Home visit completed

## 2017-05-25 ENCOUNTER — Encounter (HOSPITAL_COMMUNITY): Payer: Self-pay

## 2017-05-25 ENCOUNTER — Other Ambulatory Visit (HOSPITAL_COMMUNITY): Payer: Self-pay

## 2017-05-25 NOTE — Progress Notes (Signed)
Paramedicine Encounter    Patient ID: [name], male    DOB: 10/19/1936, 81 y.o.   MRN: [id number]   Patient Care Team: Beacher May, MD as PCP - General (Internal Medicine)  Patient Active Problem List   Diagnosis Date Noted  . Chronic systolic CHF (congestive heart failure) ([place]) 10/29/2016  . Bipolar disorder ([place])   . Alcohol abuse 07/09/2016  . Demand ischemia ([place]) 05/11/2016  . Chronic anticoagulation-Eliquis 05/11/2016  . Elevated troponin 05/11/2016  . Symptomatic anemia 05/11/2016  . Anemia 11/28/2015  . CVA (cerebral infarction)-Augb 2015 08/25/2014  . Stroke ([place]) 08/25/2014  . CKD (chronic kidney disease), stage III 07/17/2014  . Tobacco abuse 06/27/2014  . Pulmonary edema   . Chronic atrial fibrillation ([place])   . Edema 08/20/2011  . DM (diabetes mellitus), type 2 with renal complications ([place]) 22/01/5426  . Hyperlipidemia associated with type 2 diabetes mellitus ([place]) 03/10/2010  . Essential hypertension 03/10/2010  . MYOCARDIAL INFARCTION 03/10/2010  . CAD- last PCI 2011 03/10/2010  . PVD 03/10/2010  . GERD 03/10/2010    Current Outpatient Prescriptions:  .  acetaminophen (TYLENOL) 325 MG tablet, Take 650 mg by mouth every 8 (eight) hours as needed for mild pain or fever (pain). , Disp: , Rfl:  .  albuterol (PROVENTIL HFA;VENTOLIN HFA) 108 (90 BASE) MCG/ACT inhaler, Inhale 2 puffs into the lungs every 6 (six) hours as needed for shortness of breath., Disp: , Rfl:  .  ammonium lactate (LAC-HYDRIN) 12 % lotion, Apply 1 application topically daily., Disp: , Rfl:  .  apixaban (ELIQUIS) 5 MG TABS tablet, Take 1 tablet (5 mg total) by mouth every 12 (twelve) hours., Disp: 60 tablet, Rfl: 3 .  atorvastatin (LIPITOR) 40 MG tablet, Take 1 tablet (40 mg total) by mouth daily. (Patient taking differently: Take 40 mg by mouth at bedtime. ), Disp: 90 tablet, Rfl: 3 .  dextrose (GLUTOSE) 40 % GEL, Take 1 Tube by mouth daily as needed for low blood sugar. , Disp: ,  Rfl:  .  feeding supplement, GLUCERNA SHAKE, (GLUCERNA SHAKE) LIQD, Take 237 mLs by mouth 3 (three) times daily between meals., Disp: , Rfl:  .  ferrous sulfate 325 (65 FE) MG tablet, Take 325 mg by mouth 2 (two) times daily., Disp: , Rfl:  .  finasteride (PROSCAR) 5 MG tablet, Take 5 mg by mouth daily.  , Disp: , Rfl:  .  folic acid (FOLVITE) 1 MG tablet, Take 1 tablet (1 mg total) by mouth daily., Disp: 30 tablet, Rfl: 0 .  furosemide (LASIX) 20 MG tablet, Take 40 mg (2 Tablets) the AM and 20 mg (1 Tablet) in the PM, Disp: 90 tablet, Rfl: 3 .  gabapentin (NEURONTIN) 300 MG capsule, Take 300 mg by mouth 2 (two) times daily. , Disp: , Rfl:  .  hydrALAZINE (APRESOLINE) 25 MG tablet, Take 1.5 tablets (37.5 mg total) by mouth 3 (three) times daily., Disp: 90 tablet, Rfl: 6 .  insulin glargine (LANTUS) 100 UNIT/ML injection, Inject 40 Units into the skin daily., Disp: , Rfl:  .  insulin regular (NOVOLIN R,HUMULIN R) 100 units/mL injection, Inject 5 Units into the skin See admin instructions. Inject 5 units under the skin before breakfast and inject 5 units before evening meal, Disp: , Rfl:  .  isosorbide mononitrate (IMDUR) 30 MG 24 hr tablet, Take 2 tablets (60 mg total) by mouth daily., Disp: 60 tablet, Rfl: 3 .  metoprolol succinate (TOPROL-XL) 50 MG 24 hr tablet, Take 1  tablet (50 mg total) by mouth daily., Disp: 30 tablet, Rfl: 3 .  Multiple Vitamins-Minerals (MULTIVITAMIN WITH MINERALS) tablet, Take 1 tablet by mouth daily., Disp: , Rfl:  .  nicotine (NICOTROL) 10 MG inhaler, Inhale 1 Cartridge (1 continuous puffing total) into the lungs as needed for smoking cessation., Disp: 42 each, Rfl: 0 .  nystatin (MYCOSTATIN) 100000 UNIT/ML suspension, Take 5 mLs (500,000 Units total) by mouth 4 (four) times daily., Disp: 240 mL, Rfl: 0 .  pantoprazole (PROTONIX) 40 MG tablet, Take 1 tablet (40 mg total) by mouth daily., Disp: 30 tablet, Rfl: 11 .  spironolactone (ALDACTONE) 25 MG tablet, Take 0.5 tablets  (12.5 mg total) by mouth daily., Disp: 45 tablet, Rfl: 3 .  Tamsulosin HCl (FLOMAX) 0.4 MG CAPS, Take 0.4 mg by mouth Nightly. , Disp: , Rfl:  .  thiamine 100 MG tablet, Take 1 tablet (100 mg total) by mouth daily., Disp: 30 tablet, Rfl: 0 .  guaifenesin (HUMIBID E) 400 MG TABS tablet, Take 400 mg by mouth 3 (three) times daily as needed. For congestion, Disp: , Rfl:  .  simethicone (MYLICON) 80 MG chewable tablet, Chew 80 mg by mouth 3 (three) times daily as needed for flatulence. Take with meals as needed, Disp: , Rfl:  Allergies  Allergen Reactions  . Penicillins Swelling    Has patient had a PCN reaction causing immediate rash, facial/tongue/throat swelling, SOB or lightheadedness with hypotension: Yes Has patient had a PCN reaction causing severe rash involving mucus membranes or skin necrosis: No Has patient had a PCN reaction that required hospitalization: No Has patient had a PCN reaction occurring within the last 10 years: No If all of the above answers are "NO", then may proceed with Cephalosporin use.     Social History   Social History  . Marital status: Divorced    Spouse name: N/A  . Number of children: N/A  . Years of education: N/A   Occupational History  . retired Architect    Social History Main Topics  . Smoking status: Current Every Day Smoker    Packs/day: 0.50    Years: 50.00    Types: Cigarettes  . Smokeless tobacco: Never Used     Comment: He has a 50-pack-year hx of tobacco abuse. Currently, smoking about half a pack a day.  . Alcohol use Yes     Comment: Previously drank heavily, and now has a drink every other day or so.  . Drug use: Yes    Types: Marijuana, Cocaine  . Sexual activity: No   Other Topics Concern  . Not on file   Social History Narrative  . No narrative on file    Physical Exam      Future Appointments Date Time Provider [place]  06/18/2017 2:00 PM [facility] None   BP 118/70   Pulse 86   Resp  16   Wt 190 lb (86.2 kg)   SpO2 97%   BMI 25.77 kg/m  Weight yesterday-188.4  Last visit weight-188 [facility]-224  Pt reports he is doing ok, he has been struggling with several recent close deaths to him but states he is doing better now. Pt only missed one dose of his noon time meds. meds verified and pill box refilled.  He wants to see if they have on file to send his PCP dr borums office in [place] [facility]--the fax number is [phone]. He continues to smoke and drink etoh. He also  does not follow the low sodium diet.  He took his meds this morning, hasnt ate anything yet. He denies any sob, no dizziness.  Pt reports he woke up this morning around 3am and fixed him a crush float with chocolate ice cream but then took his insulin.   ACTION: Home visit completed Next visit planned for next week  Marylouise Stacks, EMT-Paramedic 05/25/17

## 2017-06-01 ENCOUNTER — Other Ambulatory Visit (HOSPITAL_COMMUNITY): Payer: Self-pay

## 2017-06-01 NOTE — Progress Notes (Signed)
Paramedicine Encounter    Patient ID: [name], male    DOB: [birth year], 81 y.o.   MRN: [id number]   Patient Care Team: Beacher May, MD as PCP - General (Internal Medicine)  Patient Active Problem List   Diagnosis Date Noted  . Chronic systolic CHF (congestive heart failure) ([place]) 10/29/2016  . Bipolar disorder ([place])   . Alcohol abuse 07/09/2016  . Demand ischemia ([place]) 05/11/2016  . Chronic anticoagulation-Eliquis 05/11/2016  . Elevated troponin 05/11/2016  . Symptomatic anemia 05/11/2016  . Anemia 11/28/2015  . CVA (cerebral infarction)-Augb 2015 08/25/2014  . Stroke ([place]) 08/25/2014  . CKD (chronic kidney disease), stage III 07/17/2014  . Tobacco abuse 06/27/2014  . Pulmonary edema   . Chronic atrial fibrillation ([place])   . Edema 08/20/2011  . DM (diabetes mellitus), type 2 with renal complications ([place]) 25/85/2778  . Hyperlipidemia associated with type 2 diabetes mellitus ([place]) 03/10/2010  . Essential hypertension 03/10/2010  . MYOCARDIAL INFARCTION 03/10/2010  . CAD- last PCI 2011 03/10/2010  . PVD 03/10/2010  . GERD 03/10/2010    Current Outpatient Prescriptions:  .  acetaminophen (TYLENOL) 325 MG tablet, Take 650 mg by mouth every 8 (eight) hours as needed for mild pain or fever (pain). , Disp: , Rfl:  .  albuterol (PROVENTIL HFA;VENTOLIN HFA) 108 (90 BASE) MCG/ACT inhaler, Inhale 2 puffs into the lungs every 6 (six) hours as needed for shortness of breath., Disp: , Rfl:  .  ammonium lactate (LAC-HYDRIN) 12 % lotion, Apply 1 application topically daily., Disp: , Rfl:  .  apixaban (ELIQUIS) 5 MG TABS tablet, Take 1 tablet (5 mg total) by mouth every 12 (twelve) hours., Disp: 60 tablet, Rfl: 3 .  atorvastatin (LIPITOR) 40 MG tablet, Take 1 tablet (40 mg total) by mouth daily. (Patient taking differently: Take 40 mg by mouth at bedtime. ), Disp: 90 tablet, Rfl: 3 .  dextrose (GLUTOSE) 40 % GEL, Take 1 Tube by mouth daily as needed for low blood sugar. , Disp: ,  Rfl:  .  feeding supplement, GLUCERNA SHAKE, (GLUCERNA SHAKE) LIQD, Take 237 mLs by mouth 3 (three) times daily between meals., Disp: , Rfl:  .  ferrous sulfate 325 (65 FE) MG tablet, Take 325 mg by mouth 2 (two) times daily., Disp: , Rfl:  .  finasteride (PROSCAR) 5 MG tablet, Take 5 mg by mouth daily.  , Disp: , Rfl:  .  folic acid (FOLVITE) 1 MG tablet, Take 1 tablet (1 mg total) by mouth daily., Disp: 30 tablet, Rfl: 0 .  furosemide (LASIX) 20 MG tablet, Take 40 mg (2 Tablets) the AM and 20 mg (1 Tablet) in the PM, Disp: 90 tablet, Rfl: 3 .  gabapentin (NEURONTIN) 300 MG capsule, Take 300 mg by mouth 2 (two) times daily. , Disp: , Rfl:  .  hydrALAZINE (APRESOLINE) 25 MG tablet, Take 1.5 tablets (37.5 mg total) by mouth 3 (three) times daily., Disp: 90 tablet, Rfl: 6 .  insulin glargine (LANTUS) 100 UNIT/ML injection, Inject 40 Units into the skin daily., Disp: , Rfl:  .  insulin regular (NOVOLIN R,HUMULIN R) 100 units/mL injection, Inject 5 Units into the skin See admin instructions. Inject 5 units under the skin before breakfast and inject 5 units before evening meal, Disp: , Rfl:  .  isosorbide mononitrate (IMDUR) 30 MG 24 hr tablet, Take 2 tablets (60 mg total) by mouth daily., Disp: 60 tablet, Rfl: 3 .  metoprolol succinate (TOPROL-XL) 50 MG 24 hr tablet, Take 1  tablet (50 mg total) by mouth daily., Disp: 30 tablet, Rfl: 3 .  Multiple Vitamins-Minerals (MULTIVITAMIN WITH MINERALS) tablet, Take 1 tablet by mouth daily., Disp: , Rfl:  .  nicotine (NICOTROL) 10 MG inhaler, Inhale 1 Cartridge (1 continuous puffing total) into the lungs as needed for smoking cessation., Disp: 42 each, Rfl: 0 .  nystatin (MYCOSTATIN) 100000 UNIT/ML suspension, Take 5 mLs (500,000 Units total) by mouth 4 (four) times daily., Disp: 240 mL, Rfl: 0 .  pantoprazole (PROTONIX) 40 MG tablet, Take 1 tablet (40 mg total) by mouth daily., Disp: 30 tablet, Rfl: 11 .  spironolactone (ALDACTONE) 25 MG tablet, Take 0.5 tablets  (12.5 mg total) by mouth daily., Disp: 45 tablet, Rfl: 3 .  Tamsulosin HCl (FLOMAX) 0.4 MG CAPS, Take 0.4 mg by mouth Nightly. , Disp: , Rfl:  .  thiamine 100 MG tablet, Take 1 tablet (100 mg total) by mouth daily., Disp: 30 tablet, Rfl: 0 .  guaifenesin (HUMIBID E) 400 MG TABS tablet, Take 400 mg by mouth 3 (three) times daily as needed. For congestion, Disp: , Rfl:  .  simethicone (MYLICON) 80 MG chewable tablet, Chew 80 mg by mouth 3 (three) times daily as needed for flatulence. Take with meals as needed, Disp: , Rfl:  Allergies  Allergen Reactions  . Penicillins Swelling    Has patient had a PCN reaction causing immediate rash, facial/tongue/throat swelling, SOB or lightheadedness with hypotension: Yes Has patient had a PCN reaction causing severe rash involving mucus membranes or skin necrosis: No Has patient had a PCN reaction that required hospitalization: No Has patient had a PCN reaction occurring within the last 10 years: No If all of the above answers are "NO", then may proceed with Cephalosporin use.     Social History   Social History  . Marital status: Divorced    Spouse name: N/A  . Number of children: N/A  . Years of education: N/A   Occupational History  . retired Architect    Social History Main Topics  . Smoking status: Current Every Day Smoker    Packs/day: 0.50    Years: 50.00    Types: Cigarettes  . Smokeless tobacco: Never Used     Comment: He has a 50-pack-year hx of tobacco abuse. Currently, smoking about half a pack a day.  . Alcohol use Yes     Comment: Previously drank heavily, and now has a drink every other day or so.  . Drug use: Yes    Types: Marijuana, Cocaine  . Sexual activity: No   Other Topics Concern  . Not on file   Social History Narrative  . No narrative on file    Physical Exam      Future Appointments Date Time Provider [place]  06/18/2017 2:00 PM [facility] None   BP 130/70   Pulse 78   Resp  16   Wt 188 lb 3.2 oz (85.4 kg)   SpO2 96%   BMI 25.52 kg/m  Weight yesterday-190 Last visit weight-190 CBG EMS-165  Pt hadnt long woke up when I arrived, he reports he got up real early this morning and took his am meds already--he missed the fri pm dose of meds last week. Weight maintaining between 187-190 at home. He reports only eating one meal daily-he reports his CBGs have been getting better. Advised him to watch it closely.  He is going [facility] meds/issues on Thursday. No sob. No  dizziness, no h/a. No swelling noted.   ACTION: Home visit completed  Marylouise Stacks, EMT-Paramedic 06/01/17

## 2017-06-08 ENCOUNTER — Other Ambulatory Visit (HOSPITAL_COMMUNITY): Payer: Self-pay

## 2017-06-08 NOTE — Progress Notes (Signed)
Paramedicine Encounter    Patient ID: [name], male    DOB: Apr 28, 1936, 81 y.o.   MRN: [id number]   Patient Care Team: Beacher May, MD as PCP - General (Internal Medicine)  Patient Active Problem List   Diagnosis Date Noted  . Chronic systolic CHF (congestive heart failure) ([place]) 10/29/2016  . Bipolar disorder ([place])   . Alcohol abuse 07/09/2016  . Demand ischemia ([place]) 05/11/2016  . Chronic anticoagulation-Eliquis 05/11/2016  . Elevated troponin 05/11/2016  . Symptomatic anemia 05/11/2016  . Anemia 11/28/2015  . CVA (cerebral infarction)-Augb 2015 08/25/2014  . Stroke ([place]) 08/25/2014  . CKD (chronic kidney disease), stage III 07/17/2014  . Tobacco abuse 06/27/2014  . Pulmonary edema   . Chronic atrial fibrillation ([place])   . Edema 08/20/2011  . DM (diabetes mellitus), type 2 with renal complications ([place]) 40/98/1191  . Hyperlipidemia associated with type 2 diabetes mellitus ([place]) 03/10/2010  . Essential hypertension 03/10/2010  . MYOCARDIAL INFARCTION 03/10/2010  . CAD- last PCI 2011 03/10/2010  . PVD 03/10/2010  . GERD 03/10/2010    Current Outpatient Prescriptions:  .  acetaminophen (TYLENOL) 325 MG tablet, Take 650 mg by mouth every 8 (eight) hours as needed for mild pain or fever (pain). , Disp: , Rfl:  .  albuterol (PROVENTIL HFA;VENTOLIN HFA) 108 (90 BASE) MCG/ACT inhaler, Inhale 2 puffs into the lungs every 6 (six) hours as needed for shortness of breath., Disp: , Rfl:  .  ammonium lactate (LAC-HYDRIN) 12 % lotion, Apply 1 application topically daily., Disp: , Rfl:  .  apixaban (ELIQUIS) 5 MG TABS tablet, Take 1 tablet (5 mg total) by mouth every 12 (twelve) hours., Disp: 60 tablet, Rfl: 3 .  atorvastatin (LIPITOR) 40 MG tablet, Take 1 tablet (40 mg total) by mouth daily. (Patient taking differently: Take 40 mg by mouth at bedtime. ), Disp: 90 tablet, Rfl: 3 .  dextrose (GLUTOSE) 40 % GEL, Take 1 Tube by mouth daily as needed for low blood sugar. , Disp: ,  Rfl:  .  feeding supplement, GLUCERNA SHAKE, (GLUCERNA SHAKE) LIQD, Take 237 mLs by mouth 3 (three) times daily between meals., Disp: , Rfl:  .  ferrous sulfate 325 (65 FE) MG tablet, Take 325 mg by mouth 2 (two) times daily., Disp: , Rfl:  .  finasteride (PROSCAR) 5 MG tablet, Take 5 mg by mouth daily.  , Disp: , Rfl:  .  folic acid (FOLVITE) 1 MG tablet, Take 1 tablet (1 mg total) by mouth daily., Disp: 30 tablet, Rfl: 0 .  furosemide (LASIX) 20 MG tablet, Take 40 mg (2 Tablets) the AM and 20 mg (1 Tablet) in the PM, Disp: 90 tablet, Rfl: 3 .  gabapentin (NEURONTIN) 300 MG capsule, Take 300 mg by mouth 2 (two) times daily. , Disp: , Rfl:  .  hydrALAZINE (APRESOLINE) 25 MG tablet, Take 1.5 tablets (37.5 mg total) by mouth 3 (three) times daily., Disp: 90 tablet, Rfl: 6 .  insulin glargine (LANTUS) 100 UNIT/ML injection, Inject 40 Units into the skin daily., Disp: , Rfl:  .  insulin regular (NOVOLIN R,HUMULIN R) 100 units/mL injection, Inject 5 Units into the skin See admin instructions. Inject 5 units under the skin before breakfast and inject 5 units before evening meal, Disp: , Rfl:  .  isosorbide mononitrate (IMDUR) 30 MG 24 hr tablet, Take 2 tablets (60 mg total) by mouth daily., Disp: 60 tablet, Rfl: 3 .  metoprolol succinate (TOPROL-XL) 50 MG 24 hr tablet, Take 1  tablet (50 mg total) by mouth daily., Disp: 30 tablet, Rfl: 3 .  Multiple Vitamins-Minerals (MULTIVITAMIN WITH MINERALS) tablet, Take 1 tablet by mouth daily., Disp: , Rfl:  .  nicotine (NICOTROL) 10 MG inhaler, Inhale 1 Cartridge (1 continuous puffing total) into the lungs as needed for smoking cessation., Disp: 42 each, Rfl: 0 .  nystatin (MYCOSTATIN) 100000 UNIT/ML suspension, Take 5 mLs (500,000 Units total) by mouth 4 (four) times daily., Disp: 240 mL, Rfl: 0 .  pantoprazole (PROTONIX) 40 MG tablet, Take 1 tablet (40 mg total) by mouth daily., Disp: 30 tablet, Rfl: 11 .  spironolactone (ALDACTONE) 25 MG tablet, Take 0.5 tablets  (12.5 mg total) by mouth daily., Disp: 45 tablet, Rfl: 3 .  Tamsulosin HCl (FLOMAX) 0.4 MG CAPS, Take 0.4 mg by mouth Nightly. , Disp: , Rfl:  .  thiamine 100 MG tablet, Take 1 tablet (100 mg total) by mouth daily., Disp: 30 tablet, Rfl: 0 .  guaifenesin (HUMIBID E) 400 MG TABS tablet, Take 400 mg by mouth 3 (three) times daily as needed. For congestion, Disp: , Rfl:  .  simethicone (MYLICON) 80 MG chewable tablet, Chew 80 mg by mouth 3 (three) times daily as needed for flatulence. Take with meals as needed, Disp: , Rfl:  Allergies  Allergen Reactions  . Penicillins Swelling    Has patient had a PCN reaction causing immediate rash, facial/tongue/throat swelling, SOB or lightheadedness with hypotension: Yes Has patient had a PCN reaction causing severe rash involving mucus membranes or skin necrosis: No Has patient had a PCN reaction that required hospitalization: No Has patient had a PCN reaction occurring within the last 10 years: No If all of the above answers are "NO", then may proceed with Cephalosporin use.     Social History   Social History  . Marital status: Divorced    Spouse name: N/A  . Number of children: N/A  . Years of education: N/A   Occupational History  . retired Architect    Social History Main Topics  . Smoking status: Current Every Day Smoker    Packs/day: 0.50    Years: 50.00    Types: Cigarettes  . Smokeless tobacco: Never Used     Comment: He has a 50-pack-year hx of tobacco abuse. Currently, smoking about half a pack a day.  . Alcohol use Yes     Comment: Previously drank heavily, and now has a drink every other day or so.  . Drug use: Yes    Types: Marijuana, Cocaine  . Sexual activity: No   Other Topics Concern  . Not on file   Social History Narrative  . No narrative on file    Physical Exam      Future Appointments Date Time Provider [place]  06/18/2017 2:00 PM [facility] None   BP (!) 142/72   Pulse 80    Resp 16   Wt 191 lb (86.6 kg)   SpO2 96%   BMI 25.90 kg/m  Weight yesterday-187 Last visit weight-188 [facility] EMS-129  Pt reports that he is doing very well. No sob, no dizziness, no swelling, no missed doses however sometimes he take them later in the day--he took his morn meds during our visit.  No other issues at this time.  ACTION: Home visit completed Next visit planned for next week  Marylouise Stacks, EMT-Paramedic 06/08/17

## 2017-06-15 ENCOUNTER — Other Ambulatory Visit (HOSPITAL_COMMUNITY): Payer: Self-pay

## 2017-06-15 NOTE — Progress Notes (Signed)
Paramedicine Encounter    Patient ID: [name], male    DOB: Aug 01, 1936, 81 y.o.   MRN: [id number]    Patient Care Team: Beacher May, MD as PCP - General (Internal Medicine)  Patient Active Problem List   Diagnosis Date Noted  . Chronic systolic CHF (congestive heart failure) ([place]) 10/29/2016  . Bipolar disorder ([place])   . Alcohol abuse 07/09/2016  . Demand ischemia ([place]) 05/11/2016  . Chronic anticoagulation-Eliquis 05/11/2016  . Elevated troponin 05/11/2016  . Symptomatic anemia 05/11/2016  . Anemia 11/28/2015  . CVA (cerebral infarction)-Augb 2015 08/25/2014  . Stroke ([place]) 08/25/2014  . CKD (chronic kidney disease), stage III 07/17/2014  . Tobacco abuse 06/27/2014  . Pulmonary edema   . Chronic atrial fibrillation ([place])   . Edema 08/20/2011  . DM (diabetes mellitus), type 2 with renal complications ([place]) 73/41/9379  . Hyperlipidemia associated with type 2 diabetes mellitus ([place]) 03/10/2010  . Essential hypertension 03/10/2010  . MYOCARDIAL INFARCTION 03/10/2010  . CAD- last PCI 2011 03/10/2010  . PVD 03/10/2010  . GERD 03/10/2010    Current Outpatient Prescriptions:  .  apixaban (ELIQUIS) 5 MG TABS tablet, Take 1 tablet (5 mg total) by mouth every 12 (twelve) hours., Disp: 60 tablet, Rfl: 3 .  atorvastatin (LIPITOR) 40 MG tablet, Take 1 tablet (40 mg total) by mouth daily. (Patient taking differently: Take 40 mg by mouth at bedtime. ), Disp: 90 tablet, Rfl: 3 .  ferrous sulfate 325 (65 FE) MG tablet, Take 325 mg by mouth 2 (two) times daily., Disp: , Rfl:  .  finasteride (PROSCAR) 5 MG tablet, Take 5 mg by mouth daily.  , Disp: , Rfl:  .  folic acid (FOLVITE) 1 MG tablet, Take 1 tablet (1 mg total) by mouth daily., Disp: 30 tablet, Rfl: 0 .  furosemide (LASIX) 20 MG tablet, Take 40 mg (2 Tablets) the AM and 20 mg (1 Tablet) in the PM, Disp: 90 tablet, Rfl: 3 .  gabapentin (NEURONTIN) 300 MG capsule, Take 300 mg by mouth 2 (two) times daily. , Disp: , Rfl:  .   hydrALAZINE (APRESOLINE) 25 MG tablet, Take 1.5 tablets (37.5 mg total) by mouth 3 (three) times daily., Disp: 90 tablet, Rfl: 6 .  isosorbide mononitrate (IMDUR) 30 MG 24 hr tablet, Take 2 tablets (60 mg total) by mouth daily., Disp: 60 tablet, Rfl: 3 .  metoprolol succinate (TOPROL-XL) 50 MG 24 hr tablet, Take 1 tablet (50 mg total) by mouth daily., Disp: 30 tablet, Rfl: 3 .  Multiple Vitamins-Minerals (MULTIVITAMIN WITH MINERALS) tablet, Take 1 tablet by mouth daily., Disp: , Rfl:  .  pantoprazole (PROTONIX) 40 MG tablet, Take 1 tablet (40 mg total) by mouth daily., Disp: 30 tablet, Rfl: 11 .  spironolactone (ALDACTONE) 25 MG tablet, Take 0.5 tablets (12.5 mg total) by mouth daily., Disp: 45 tablet, Rfl: 3 .  Tamsulosin HCl (FLOMAX) 0.4 MG CAPS, Take 0.4 mg by mouth Nightly. , Disp: , Rfl:  .  thiamine 100 MG tablet, Take 1 tablet (100 mg total) by mouth daily., Disp: 30 tablet, Rfl: 0 .  acetaminophen (TYLENOL) 325 MG tablet, Take 650 mg by mouth every 8 (eight) hours as needed for mild pain or fever (pain). , Disp: , Rfl:  .  albuterol (PROVENTIL HFA;VENTOLIN HFA) 108 (90 BASE) MCG/ACT inhaler, Inhale 2 puffs into the lungs every 6 (six) hours as needed for shortness of breath., Disp: , Rfl:  .  ammonium lactate (LAC-HYDRIN) 12 % lotion, Apply 1  application topically daily., Disp: , Rfl:  .  dextrose (GLUTOSE) 40 % GEL, Take 1 Tube by mouth daily as needed for low blood sugar. , Disp: , Rfl:  .  feeding supplement, GLUCERNA SHAKE, (GLUCERNA SHAKE) LIQD, Take 237 mLs by mouth 3 (three) times daily between meals., Disp: , Rfl:  .  guaifenesin (HUMIBID E) 400 MG TABS tablet, Take 400 mg by mouth 3 (three) times daily as needed. For congestion, Disp: , Rfl:  .  insulin glargine (LANTUS) 100 UNIT/ML injection, Inject 40 Units into the skin daily., Disp: , Rfl:  .  insulin regular (NOVOLIN R,HUMULIN R) 100 units/mL injection, Inject 5 Units into the skin See admin instructions. Inject 5 units under the  skin before breakfast and inject 5 units before evening meal, Disp: , Rfl:  .  nicotine (NICOTROL) 10 MG inhaler, Inhale 1 Cartridge (1 continuous puffing total) into the lungs as needed for smoking cessation., Disp: 42 each, Rfl: 0 .  nystatin (MYCOSTATIN) 100000 UNIT/ML suspension, Take 5 mLs (500,000 Units total) by mouth 4 (four) times daily., Disp: 240 mL, Rfl: 0 .  simethicone (MYLICON) 80 MG chewable tablet, Chew 80 mg by mouth 3 (three) times daily as needed for flatulence. Take with meals as needed, Disp: , Rfl:  Allergies  Allergen Reactions  . Penicillins Swelling    Has patient had a PCN reaction causing immediate rash, facial/tongue/throat swelling, SOB or lightheadedness with hypotension: Yes Has patient had a PCN reaction causing severe rash involving mucus membranes or skin necrosis: No Has patient had a PCN reaction that required hospitalization: No Has patient had a PCN reaction occurring within the last 10 years: No If all of the above answers are "NO", then may proceed with Cephalosporin use.     Social History   Social History  . Marital status: Divorced    Spouse name: N/A  . Number of children: N/A  . Years of education: N/A   Occupational History  . retired Architect    Social History Main Topics  . Smoking status: Current Every Day Smoker    Packs/day: 0.50    Years: 50.00    Types: Cigarettes  . Smokeless tobacco: Never Used     Comment: He has a 50-pack-year hx of tobacco abuse. Currently, smoking about half a pack a day.  . Alcohol use Yes     Comment: Previously drank heavily, and now has a drink every other day or so.  . Drug use: Yes    Types: Marijuana, Cocaine  . Sexual activity: No   Other Topics Concern  . Not on file   Social History Narrative  . No narrative on file    Physical Exam  Pulmonary/Chest: No respiratory distress. He has no wheezes. He has no rales.  Abdominal: He exhibits no distension. There is no tenderness. There is  no guarding.  Musculoskeletal: He exhibits no edema.  Skin: Skin is warm and dry. He is not diaphoretic.        Future Appointments Date Time Provider [place]  06/18/2017 2:00 PM [facility] None    ATF pt CAO x4 in the bathroom, just finished up from showering.  Pt's homeaide is on scene and assisting pt.  He stated that he doesn't feel great but he is very vague in his complaint.  After several mins of talking he forgot what he was complaining about earlier.  Pt admits to drinking alcohol over the weekend and today; he switched to  Rum.  He has eaten today and his appetite has gotten better per pt.  Pt denies sob, dizziness, headache and chest pain.  Pt has taken all of his medications without difficulty.  rx bottles verified and pill box refilled.  BP 112/70 (BP Location: Right Arm, Patient Position: Sitting, Cuff Size: Normal)   Pulse 81   Resp 16   Wt 189 lb 9.6 oz (86 kg)   SpO2 94%   BMI 25.71 kg/m   cbg 142 Weight yesterday-didn't weigh Last visit weight-191    Atasha Colebank, EMT Paramedic 06/15/2017    ACTION: Home visit completed

## 2017-06-18 ENCOUNTER — Encounter (HOSPITAL_COMMUNITY): Payer: Self-pay

## 2017-06-23 ENCOUNTER — Other Ambulatory Visit (HOSPITAL_COMMUNITY): Payer: Self-pay

## 2017-06-23 NOTE — Progress Notes (Signed)
Paramedicine Encounter    Patient ID: [name], male    DOB: Oct 30, 1936, 81 y.o.   MRN: [id number]    Patient Care Team: Beacher May, MD as PCP - General (Internal Medicine)  Patient Active Problem List   Diagnosis Date Noted  . Chronic systolic CHF (congestive heart failure) ([place]) 10/29/2016  . Bipolar disorder ([place])   . Alcohol abuse 07/09/2016  . Demand ischemia ([place]) 05/11/2016  . Chronic anticoagulation-Eliquis 05/11/2016  . Elevated troponin 05/11/2016  . Symptomatic anemia 05/11/2016  . Anemia 11/28/2015  . CVA (cerebral infarction)-Augb 2015 08/25/2014  . Stroke ([place]) 08/25/2014  . CKD (chronic kidney disease), stage III 07/17/2014  . Tobacco abuse 06/27/2014  . Pulmonary edema   . Chronic atrial fibrillation ([place])   . Edema 08/20/2011  . DM (diabetes mellitus), type 2 with renal complications ([place]) 62/13/0865  . Hyperlipidemia associated with type 2 diabetes mellitus ([place]) 03/10/2010  . Essential hypertension 03/10/2010  . MYOCARDIAL INFARCTION 03/10/2010  . CAD- last PCI 2011 03/10/2010  . PVD 03/10/2010  . GERD 03/10/2010    Current Outpatient Prescriptions:  .  apixaban (ELIQUIS) 5 MG TABS tablet, Take 1 tablet (5 mg total) by mouth every 12 (twelve) hours., Disp: 60 tablet, Rfl: 3 .  atorvastatin (LIPITOR) 40 MG tablet, Take 1 tablet (40 mg total) by mouth daily. (Patient taking differently: Take 40 mg by mouth at bedtime. ), Disp: 90 tablet, Rfl: 3 .  ferrous sulfate 325 (65 FE) MG tablet, Take 325 mg by mouth 2 (two) times daily., Disp: , Rfl:  .  finasteride (PROSCAR) 5 MG tablet, Take 5 mg by mouth daily.  , Disp: , Rfl:  .  folic acid (FOLVITE) 1 MG tablet, Take 1 tablet (1 mg total) by mouth daily., Disp: 30 tablet, Rfl: 0 .  furosemide (LASIX) 20 MG tablet, Take 40 mg (2 Tablets) the AM and 20 mg (1 Tablet) in the PM, Disp: 90 tablet, Rfl: 3 .  gabapentin (NEURONTIN) 300 MG capsule, Take 300 mg by mouth 2 (two) times daily. , Disp: , Rfl:  .   hydrALAZINE (APRESOLINE) 25 MG tablet, Take 1.5 tablets (37.5 mg total) by mouth 3 (three) times daily., Disp: 90 tablet, Rfl: 6 .  isosorbide mononitrate (IMDUR) 30 MG 24 hr tablet, Take 2 tablets (60 mg total) by mouth daily., Disp: 60 tablet, Rfl: 3 .  metoprolol succinate (TOPROL-XL) 50 MG 24 hr tablet, Take 1 tablet (50 mg total) by mouth daily., Disp: 30 tablet, Rfl: 3 .  Multiple Vitamins-Minerals (MULTIVITAMIN WITH MINERALS) tablet, Take 1 tablet by mouth daily., Disp: , Rfl:  .  pantoprazole (PROTONIX) 40 MG tablet, Take 1 tablet (40 mg total) by mouth daily., Disp: 30 tablet, Rfl: 11 .  spironolactone (ALDACTONE) 25 MG tablet, Take 0.5 tablets (12.5 mg total) by mouth daily., Disp: 45 tablet, Rfl: 3 .  Tamsulosin HCl (FLOMAX) 0.4 MG CAPS, Take 0.4 mg by mouth Nightly. , Disp: , Rfl:  .  thiamine 100 MG tablet, Take 1 tablet (100 mg total) by mouth daily., Disp: 30 tablet, Rfl: 0 .  acetaminophen (TYLENOL) 325 MG tablet, Take 650 mg by mouth every 8 (eight) hours as needed for mild pain or fever (pain). , Disp: , Rfl:  .  albuterol (PROVENTIL HFA;VENTOLIN HFA) 108 (90 BASE) MCG/ACT inhaler, Inhale 2 puffs into the lungs every 6 (six) hours as needed for shortness of breath., Disp: , Rfl:  .  ammonium lactate (LAC-HYDRIN) 12 % lotion, Apply 1  application topically daily., Disp: , Rfl:  .  dextrose (GLUTOSE) 40 % GEL, Take 1 Tube by mouth daily as needed for low blood sugar. , Disp: , Rfl:  .  feeding supplement, GLUCERNA SHAKE, (GLUCERNA SHAKE) LIQD, Take 237 mLs by mouth 3 (three) times daily between meals., Disp: , Rfl:  .  guaifenesin (HUMIBID E) 400 MG TABS tablet, Take 400 mg by mouth 3 (three) times daily as needed. For congestion, Disp: , Rfl:  .  insulin glargine (LANTUS) 100 UNIT/ML injection, Inject 40 Units into the skin daily., Disp: , Rfl:  .  insulin regular (NOVOLIN R,HUMULIN R) 100 units/mL injection, Inject 5 Units into the skin See admin instructions. Inject 5 units under the  skin before breakfast and inject 5 units before evening meal, Disp: , Rfl:  .  nicotine (NICOTROL) 10 MG inhaler, Inhale 1 Cartridge (1 continuous puffing total) into the lungs as needed for smoking cessation., Disp: 42 each, Rfl: 0 .  nystatin (MYCOSTATIN) 100000 UNIT/ML suspension, Take 5 mLs (500,000 Units total) by mouth 4 (four) times daily., Disp: 240 mL, Rfl: 0 .  simethicone (MYLICON) 80 MG chewable tablet, Chew 80 mg by mouth 3 (three) times daily as needed for flatulence. Take with meals as needed, Disp: , Rfl:  Allergies  Allergen Reactions  . Penicillins Swelling    Has patient had a PCN reaction causing immediate rash, facial/tongue/throat swelling, SOB or lightheadedness with hypotension: Yes Has patient had a PCN reaction causing severe rash involving mucus membranes or skin necrosis: No Has patient had a PCN reaction that required hospitalization: No Has patient had a PCN reaction occurring within the last 10 years: No If all of the above answers are "NO", then may proceed with Cephalosporin use.     Social History   Social History  . Marital status: Divorced    Spouse name: N/A  . Number of children: N/A  . Years of education: N/A   Occupational History  . retired Architect    Social History Main Topics  . Smoking status: Current Every Day Smoker    Packs/day: 0.50    Years: 50.00    Types: Cigarettes  . Smokeless tobacco: Never Used     Comment: He has a 50-pack-year hx of tobacco abuse. Currently, smoking about half a pack a day.  . Alcohol use Yes     Comment: Previously drank heavily, and now has a drink every other day or so.  . Drug use: Yes    Types: Marijuana, Cocaine  . Sexual activity: No   Other Topics Concern  . Not on file   Social History Narrative  . No narrative on file    Physical Exam  Pulmonary/Chest: No respiratory distress.  Abdominal: He exhibits no distension. There is no tenderness. There is no guarding.  Musculoskeletal: He  exhibits no edema.  Skin: Skin is warm and dry. He is not diaphoretic.        Future Appointments Date Time Provider [place]  08/23/2017 12:00 PM Larey Dresser, MD [facility] None    ATF pt CAO x4 laying on his bed very agitated. Pt stated that we had ruined his plans for his appointment with the hearing clinic at 56.  After several mins of explaining why his appointment was rescheduled and reassuring him that this visit wouldn't take long he calmed down. He stated that he wasn't doing well due to the same, he admitted to being very anxious. Pt hadn't taken his  morning meds today until I filled the pill box.  Pt didn't miss any medication doses within this past week.  He denies sob, dizziness and chest pain.  rx bottles verified and pill box refilled.    BP 136/68 (BP Location: Left Arm, Patient Position: Sitting, Cuff Size: Normal)   Pulse 76   Resp 16   Wt 188 lb (85.3 kg)   SpO2 97%   BMI 25.50 kg/m   *rx called in: Gabapentin filled completely  Weight yesterday- Last visit weight-189    Durga Saldarriaga, EMT Paramedic 06/23/2017    ACTION: Home visit completed

## 2017-06-29 ENCOUNTER — Other Ambulatory Visit (HOSPITAL_COMMUNITY): Payer: Self-pay
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
## 2017-07-06 ENCOUNTER — Other Ambulatory Visit (HOSPITAL_COMMUNITY): Payer: Self-pay

## 2017-07-06 NOTE — Progress Notes (Signed)
Paramedicine Encounter    Patient ID: [name], male    DOB: 1936-02-26, 81 y.o.   MRN: [id number]   Patient Care Team: Beacher May, MD as PCP - General (Internal Medicine)  Patient Active Problem List   Diagnosis Date Noted  . Chronic systolic CHF (congestive heart failure) ([place]) 10/29/2016  . Bipolar disorder ([place])   . Alcohol abuse 07/09/2016  . Demand ischemia ([place]) 05/11/2016  . Chronic anticoagulation-Eliquis 05/11/2016  . Elevated troponin 05/11/2016  . Symptomatic anemia 05/11/2016  . Anemia 11/28/2015  . CVA (cerebral infarction)-Augb 2015 08/25/2014  . Stroke ([place]) 08/25/2014  . CKD (chronic kidney disease), stage III 07/17/2014  . Tobacco abuse 06/27/2014  . Pulmonary edema   . Chronic atrial fibrillation ([place])   . Edema 08/20/2011  . DM (diabetes mellitus), type 2 with renal complications ([place]) 51/88/4166  . Hyperlipidemia associated with type 2 diabetes mellitus ([place]) 03/10/2010  . Essential hypertension 03/10/2010  . MYOCARDIAL INFARCTION 03/10/2010  . CAD- last PCI 2011 03/10/2010  . PVD 03/10/2010  . GERD 03/10/2010    Current Outpatient Prescriptions:  .  acetaminophen (TYLENOL) 325 MG tablet, Take 650 mg by mouth every 8 (eight) hours as needed for mild pain or fever (pain). , Disp: , Rfl:  .  albuterol (PROVENTIL HFA;VENTOLIN HFA) 108 (90 BASE) MCG/ACT inhaler, Inhale 2 puffs into the lungs every 6 (six) hours as needed for shortness of breath., Disp: , Rfl:  .  ammonium lactate (LAC-HYDRIN) 12 % lotion, Apply 1 application topically daily., Disp: , Rfl:  .  apixaban (ELIQUIS) 5 MG TABS tablet, Take 1 tablet (5 mg total) by mouth every 12 (twelve) hours., Disp: 60 tablet, Rfl: 3 .  atorvastatin (LIPITOR) 40 MG tablet, Take 1 tablet (40 mg total) by mouth daily. (Patient taking differently: Take 40 mg by mouth at bedtime. ), Disp: 90 tablet, Rfl: 3 .  dextrose (GLUTOSE) 40 % GEL, Take 1 Tube by mouth daily as needed for low blood sugar. , Disp: ,  Rfl:  .  feeding supplement, GLUCERNA SHAKE, (GLUCERNA SHAKE) LIQD, Take 237 mLs by mouth 3 (three) times daily between meals., Disp: , Rfl:  .  ferrous sulfate 325 (65 FE) MG tablet, Take 325 mg by mouth 2 (two) times daily., Disp: , Rfl:  .  finasteride (PROSCAR) 5 MG tablet, Take 5 mg by mouth daily.  , Disp: , Rfl:  .  folic acid (FOLVITE) 1 MG tablet, Take 1 tablet (1 mg total) by mouth daily., Disp: 30 tablet, Rfl: 0 .  furosemide (LASIX) 20 MG tablet, Take 40 mg (2 Tablets) the AM and 20 mg (1 Tablet) in the PM, Disp: 90 tablet, Rfl: 3 .  gabapentin (NEURONTIN) 300 MG capsule, Take 300 mg by mouth 2 (two) times daily. , Disp: , Rfl:  .  hydrALAZINE (APRESOLINE) 25 MG tablet, Take 1.5 tablets (37.5 mg total) by mouth 3 (three) times daily., Disp: 90 tablet, Rfl: 6 .  insulin glargine (LANTUS) 100 UNIT/ML injection, Inject 40 Units into the skin daily., Disp: , Rfl:  .  insulin regular (NOVOLIN R,HUMULIN R) 100 units/mL injection, Inject 5 Units into the skin See admin instructions. Inject 5 units under the skin before breakfast and inject 5 units before evening meal, Disp: , Rfl:  .  isosorbide mononitrate (IMDUR) 30 MG 24 hr tablet, Take 2 tablets (60 mg total) by mouth daily., Disp: 60 tablet, Rfl: 3 .  metoprolol succinate (TOPROL-XL) 50 MG 24 hr tablet, Take 1  tablet (50 mg total) by mouth daily., Disp: 30 tablet, Rfl: 3 .  Multiple Vitamins-Minerals (MULTIVITAMIN WITH MINERALS) tablet, Take 1 tablet by mouth daily., Disp: , Rfl:  .  nystatin (MYCOSTATIN) 100000 UNIT/ML suspension, Take 5 mLs (500,000 Units total) by mouth 4 (four) times daily., Disp: 240 mL, Rfl: 0 .  pantoprazole (PROTONIX) 40 MG tablet, Take 1 tablet (40 mg total) by mouth daily., Disp: 30 tablet, Rfl: 11 .  spironolactone (ALDACTONE) 25 MG tablet, Take 0.5 tablets (12.5 mg total) by mouth daily., Disp: 45 tablet, Rfl: 3 .  Tamsulosin HCl (FLOMAX) 0.4 MG CAPS, Take 0.4 mg by mouth Nightly. , Disp: , Rfl:  .  thiamine  100 MG tablet, Take 1 tablet (100 mg total) by mouth daily., Disp: 30 tablet, Rfl: 0 .  guaifenesin (HUMIBID E) 400 MG TABS tablet, Take 400 mg by mouth 3 (three) times daily as needed. For congestion, Disp: , Rfl:  .  nicotine (NICOTROL) 10 MG inhaler, Inhale 1 Cartridge (1 continuous puffing total) into the lungs as needed for smoking cessation. (Patient not taking: Reported on 06/29/2017), Disp: 42 each, Rfl: 0 .  simethicone (MYLICON) 80 MG chewable tablet, Chew 80 mg by mouth 3 (three) times daily as needed for flatulence. Take with meals as needed, Disp: , Rfl:  Allergies  Allergen Reactions  . Penicillins Swelling    Has patient had a PCN reaction causing immediate rash, facial/tongue/throat swelling, SOB or lightheadedness with hypotension: Yes Has patient had a PCN reaction causing severe rash involving mucus membranes or skin necrosis: No Has patient had a PCN reaction that required hospitalization: No Has patient had a PCN reaction occurring within the last 10 years: No If all of the above answers are "NO", then may proceed with Cephalosporin use.     Social History   Social History  . Marital status: Divorced    Spouse name: N/A  . Number of children: N/A  . Years of education: N/A   Occupational History  . retired Architect    Social History Main Topics  . Smoking status: Current Every Day Smoker    Packs/day: 0.50    Years: 50.00    Types: Cigarettes  . Smokeless tobacco: Never Used     Comment: He has a 50-pack-year hx of tobacco abuse. Currently, smoking about half a pack a day.  . Alcohol use Yes     Comment: Previously drank heavily, and now has a drink every other day or so.  . Drug use: Yes    Types: Marijuana, Cocaine  . Sexual activity: No   Other Topics Concern  . Not on file   Social History Narrative  . No narrative on file    Physical Exam      Future Appointments Date Time Provider [place]  08/23/2017 12:00 PM Larey Dresser,  MD [facility] None   BP (!) 110/58   Pulse 64   Resp 15   Wt 186 lb (84.4 kg)   SpO2 96%   BMI 25.23 kg/m  Weight yesterday-186  Last visit weight-190  Pt reports he has had the "runs" this morning, he did nothing but drink the glucerna yesterday and nothing else to eat and very little water the past few days so this happens when he doesn't eat other meals while drinking it. He feels weak at times, advised him to drink water and to call back later if he feels worse, and if he needs 911 to please call.  he states he will get his aide to fix him food when he arrives shortly. Pt denies any sob, he states he got dizzy if standing for too long, could not get orthostatics due to him being afraid of falling. No swelling, missed a dose of his noon time meds last week. Is going to [place].     ACTION: Home visit completed Next visit planned for next week  Marylouise Stacks, EMT-Paramedic 07/06/17

## 2017-07-13 ENCOUNTER — Other Ambulatory Visit (HOSPITAL_COMMUNITY): Payer: Self-pay

## 2017-07-13 NOTE — Progress Notes (Signed)
Paramedicine Encounter    Patient ID: [name], male    DOB: 1936/07/08, 81 y.o.   MRN: [id number]   Patient Care Team: Beacher May, MD as PCP - General (Internal Medicine)  Patient Active Problem List   Diagnosis Date Noted  . Chronic systolic CHF (congestive heart failure) ([place]) 10/29/2016  . Bipolar disorder ([place])   . Alcohol abuse 07/09/2016  . Demand ischemia ([place]) 05/11/2016  . Chronic anticoagulation-Eliquis 05/11/2016  . Elevated troponin 05/11/2016  . Symptomatic anemia 05/11/2016  . Anemia 11/28/2015  . CVA (cerebral infarction)-Augb 2015 08/25/2014  . Stroke ([place]) 08/25/2014  . CKD (chronic kidney disease), stage III 07/17/2014  . Tobacco abuse 06/27/2014  . Pulmonary edema   . Chronic atrial fibrillation ([place])   . Edema 08/20/2011  . DM (diabetes mellitus), type 2 with renal complications ([place]) 26/71/2458  . Hyperlipidemia associated with type 2 diabetes mellitus ([place]) 03/10/2010  . Essential hypertension 03/10/2010  . MYOCARDIAL INFARCTION 03/10/2010  . CAD- last PCI 2011 03/10/2010  . PVD 03/10/2010  . GERD 03/10/2010    Current Outpatient Prescriptions:  .  acetaminophen (TYLENOL) 325 MG tablet, Take 650 mg by mouth every 8 (eight) hours as needed for mild pain or fever (pain). , Disp: , Rfl:  .  albuterol (PROVENTIL HFA;VENTOLIN HFA) 108 (90 BASE) MCG/ACT inhaler, Inhale 2 puffs into the lungs every 6 (six) hours as needed for shortness of breath., Disp: , Rfl:  .  ammonium lactate (LAC-HYDRIN) 12 % lotion, Apply 1 application topically daily., Disp: , Rfl:  .  apixaban (ELIQUIS) 5 MG TABS tablet, Take 1 tablet (5 mg total) by mouth every 12 (twelve) hours., Disp: 60 tablet, Rfl: 3 .  atorvastatin (LIPITOR) 40 MG tablet, Take 1 tablet (40 mg total) by mouth daily. (Patient taking differently: Take 40 mg by mouth at bedtime. ), Disp: 90 tablet, Rfl: 3 .  dextrose (GLUTOSE) 40 % GEL, Take 1 Tube by mouth daily as needed for low blood sugar. , Disp: ,  Rfl:  .  feeding supplement, GLUCERNA SHAKE, (GLUCERNA SHAKE) LIQD, Take 237 mLs by mouth 3 (three) times daily between meals., Disp: , Rfl:  .  ferrous sulfate 325 (65 FE) MG tablet, Take 325 mg by mouth 2 (two) times daily., Disp: , Rfl:  .  finasteride (PROSCAR) 5 MG tablet, Take 5 mg by mouth daily.  , Disp: , Rfl:  .  folic acid (FOLVITE) 1 MG tablet, Take 1 tablet (1 mg total) by mouth daily., Disp: 30 tablet, Rfl: 0 .  furosemide (LASIX) 20 MG tablet, Take 40 mg (2 Tablets) the AM and 20 mg (1 Tablet) in the PM, Disp: 90 tablet, Rfl: 3 .  gabapentin (NEURONTIN) 300 MG capsule, Take 300 mg by mouth 2 (two) times daily. , Disp: , Rfl:  .  hydrALAZINE (APRESOLINE) 25 MG tablet, Take 1.5 tablets (37.5 mg total) by mouth 3 (three) times daily., Disp: 90 tablet, Rfl: 6 .  insulin glargine (LANTUS) 100 UNIT/ML injection, Inject 40 Units into the skin daily., Disp: , Rfl:  .  insulin regular (NOVOLIN R,HUMULIN R) 100 units/mL injection, Inject 5 Units into the skin See admin instructions. Inject 5 units under the skin before breakfast and inject 5 units before evening meal, Disp: , Rfl:  .  isosorbide mononitrate (IMDUR) 30 MG 24 hr tablet, Take 2 tablets (60 mg total) by mouth daily., Disp: 60 tablet, Rfl: 3 .  metoprolol succinate (TOPROL-XL) 50 MG 24 hr tablet, Take 1  tablet (50 mg total) by mouth daily., Disp: 30 tablet, Rfl: 3 .  Multiple Vitamins-Minerals (MULTIVITAMIN WITH MINERALS) tablet, Take 1 tablet by mouth daily., Disp: , Rfl:  .  nystatin (MYCOSTATIN) 100000 UNIT/ML suspension, Take 5 mLs (500,000 Units total) by mouth 4 (four) times daily., Disp: 240 mL, Rfl: 0 .  pantoprazole (PROTONIX) 40 MG tablet, Take 1 tablet (40 mg total) by mouth daily., Disp: 30 tablet, Rfl: 11 .  spironolactone (ALDACTONE) 25 MG tablet, Take 0.5 tablets (12.5 mg total) by mouth daily., Disp: 45 tablet, Rfl: 3 .  Tamsulosin HCl (FLOMAX) 0.4 MG CAPS, Take 0.4 mg by mouth Nightly. , Disp: , Rfl:  .  thiamine  100 MG tablet, Take 1 tablet (100 mg total) by mouth daily., Disp: 30 tablet, Rfl: 0 .  guaifenesin (HUMIBID E) 400 MG TABS tablet, Take 400 mg by mouth 3 (three) times daily as needed. For congestion, Disp: , Rfl:  .  nicotine (NICOTROL) 10 MG inhaler, Inhale 1 Cartridge (1 continuous puffing total) into the lungs as needed for smoking cessation. (Patient not taking: Reported on 06/29/2017), Disp: 42 each, Rfl: 0 .  simethicone (MYLICON) 80 MG chewable tablet, Chew 80 mg by mouth 3 (three) times daily as needed for flatulence. Take with meals as needed, Disp: , Rfl:  Allergies  Allergen Reactions  . Penicillins Swelling    Has patient had a PCN reaction causing immediate rash, facial/tongue/throat swelling, SOB or lightheadedness with hypotension: Yes Has patient had a PCN reaction causing severe rash involving mucus membranes or skin necrosis: No Has patient had a PCN reaction that required hospitalization: No Has patient had a PCN reaction occurring within the last 10 years: No If all of the above answers are "NO", then may proceed with Cephalosporin use.     Social History   Social History  . Marital status: Divorced    Spouse name: N/A  . Number of children: N/A  . Years of education: N/A   Occupational History  . retired Architect    Social History Main Topics  . Smoking status: Current Every Day Smoker    Packs/day: 0.50    Years: 50.00    Types: Cigarettes  . Smokeless tobacco: Never Used     Comment: He has a 50-pack-year hx of tobacco abuse. Currently, smoking about half a pack a day.  . Alcohol use Yes     Comment: Previously drank heavily, and now has a drink every other day or so.  . Drug use: Yes    Types: Marijuana, Cocaine  . Sexual activity: No   Other Topics Concern  . Not on file   Social History Narrative  . No narrative on file    Physical Exam      Future Appointments Date Time Provider [place]  08/23/2017 12:00 PM Larey Dresser,  MD [facility] None   Pulse 80   Resp 14   SpO2 55% b/p-70/systolic sitting  And b/p 62/systolic standing Weight yesterday-??? Last visit weight-186 CBG PTA-93  Pt reports he is feeling fair today--he had fallen back while sitting in chair the other day due to him sitting in it too quickly and it fell backward--he hurt his foot while trying to get up.  He went to [place] doc last week,  He missed sun noon dose, missed mon pm dose and missed one am dose-not sure what day as he had tuesdays still in here but he took it from another day-- Pt had just  finished eating 1 1/2 hotdog with beans when I arrived. Pt states he is not getting the good feeling he used to get drinking alcohol-he is going to speak to psychologist soon. Pt has not been able to weigh since he fell- He denies any sob, no swelling, no obvious injury noted to leg/foot.  No issues other than his leg pain.  B/p-sitting was 70/systolic and standing it was 62/systolic He had a dizzy spell earlier but did not pass out.  Per amy at clinic to hold the hydralazine mid dose and evening dose and to hold the noon dose of lasix for today and me come back out tomor for recheck.    ACTION: Home visit completed Next visit planned for [place], EMT-Paramedic 07/13/17

## 2017-07-14 ENCOUNTER — Other Ambulatory Visit (HOSPITAL_COMMUNITY): Payer: Self-pay

## 2017-07-14 NOTE — Progress Notes (Signed)
Came by today for b/p recheck since yesterdays visit was a low b/p and meds were held per clinic--- Pt reports he is feeling great and hasnt felt this well in a long time--no sob, no dizziness today, no issues with bleeding. Pt smokig cigarette when I arrived. Pt did not weigh again this morning-he reports he not long ago woke up and just ate and didn't want to weigh at that point-states he will weigh in the morning and call me to let me know.   B/p- 110/58 p-62 sp02-97%

## 2017-07-20 ENCOUNTER — Telehealth (HOSPITAL_COMMUNITY): Payer: Self-pay | Admitting: *Deleted

## 2017-07-20 ENCOUNTER — Other Ambulatory Visit (HOSPITAL_COMMUNITY): Payer: Self-pay

## 2017-07-20 MED ORDER — ISOSORBIDE MONONITRATE ER 30 MG PO TB24: 30.0000 mg | ORAL_TABLET | Freq: Every day | ORAL | 3 refills | Status: AC

## 2017-07-20 MED ORDER — HYDRALAZINE HCL 25 MG PO TABS: 25.0000 mg | ORAL_TABLET | Freq: Three times a day (TID) | ORAL | 6 refills | Status: AC

## 2017-07-20 NOTE — Telephone Encounter (Signed)
[name] is aware and will assit pt with med changes.

## 2017-07-20 NOTE — Telephone Encounter (Signed)
Katie with paramedicine called to report patients blood pressure today 98/74. His systolic bp dropped to 86 when he stood up.  His weight is down 4-5lbs from last week. He is c/o increased fatigue. Message routed to [place] for advice.

## 2017-07-20 NOTE — Telephone Encounter (Signed)
Decrease hydralazine to 25 mg tid and Imdur to 30 daily.  Hold Lasix for a day then resume.

## 2017-07-20 NOTE — Progress Notes (Signed)
Paramedicine Encounter    Patient ID: [name], male    DOB: 12-Mar-1936, 81 y.o.   MRN: [id number]   Patient Care Team: Beacher May, MD as PCP - General (Internal Medicine)  Patient Active Problem List   Diagnosis Date Noted  . Chronic systolic CHF (congestive heart failure) ([place]) 10/29/2016  . Bipolar disorder ([place])   . Alcohol abuse 07/09/2016  . Demand ischemia ([place]) 05/11/2016  . Chronic anticoagulation-Eliquis 05/11/2016  . Elevated troponin 05/11/2016  . Symptomatic anemia 05/11/2016  . Anemia 11/28/2015  . CVA (cerebral infarction)-Augb 2015 08/25/2014  . Stroke ([place]) 08/25/2014  . CKD (chronic kidney disease), stage III 07/17/2014  . Tobacco abuse 06/27/2014  . Pulmonary edema   . Chronic atrial fibrillation ([place])   . Edema 08/20/2011  . DM (diabetes mellitus), type 2 with renal complications ([place]) 16/60/6301  . Hyperlipidemia associated with type 2 diabetes mellitus ([place]) 03/10/2010  . Essential hypertension 03/10/2010  . MYOCARDIAL INFARCTION 03/10/2010  . CAD- last PCI 2011 03/10/2010  . PVD 03/10/2010  . GERD 03/10/2010    Current Outpatient Prescriptions:  .  acetaminophen (TYLENOL) 325 MG tablet, Take 650 mg by mouth every 8 (eight) hours as needed for mild pain or fever (pain). , Disp: , Rfl:  .  albuterol (PROVENTIL HFA;VENTOLIN HFA) 108 (90 BASE) MCG/ACT inhaler, Inhale 2 puffs into the lungs every 6 (six) hours as needed for shortness of breath., Disp: , Rfl:  .  ammonium lactate (LAC-HYDRIN) 12 % lotion, Apply 1 application topically daily., Disp: , Rfl:  .  apixaban (ELIQUIS) 5 MG TABS tablet, Take 1 tablet (5 mg total) by mouth every 12 (twelve) hours., Disp: 60 tablet, Rfl: 3 .  atorvastatin (LIPITOR) 40 MG tablet, Take 1 tablet (40 mg total) by mouth daily. (Patient taking differently: Take 40 mg by mouth at bedtime. ), Disp: 90 tablet, Rfl: 3 .  dextrose (GLUTOSE) 40 % GEL, Take 1 Tube by mouth daily as needed for low blood sugar. , Disp: ,  Rfl:  .  feeding supplement, GLUCERNA SHAKE, (GLUCERNA SHAKE) LIQD, Take 237 mLs by mouth 3 (three) times daily between meals., Disp: , Rfl:  .  ferrous sulfate 325 (65 FE) MG tablet, Take 325 mg by mouth 2 (two) times daily., Disp: , Rfl:  .  finasteride (PROSCAR) 5 MG tablet, Take 5 mg by mouth daily.  , Disp: , Rfl:  .  folic acid (FOLVITE) 1 MG tablet, Take 1 tablet (1 mg total) by mouth daily., Disp: 30 tablet, Rfl: 0 .  furosemide (LASIX) 20 MG tablet, Take 40 mg (2 Tablets) the AM and 20 mg (1 Tablet) in the PM, Disp: 90 tablet, Rfl: 3 .  gabapentin (NEURONTIN) 300 MG capsule, Take 300 mg by mouth 2 (two) times daily. , Disp: , Rfl:  .  hydrALAZINE (APRESOLINE) 25 MG tablet, Take 1.5 tablets (37.5 mg total) by mouth 3 (three) times daily., Disp: 90 tablet, Rfl: 6 .  insulin glargine (LANTUS) 100 UNIT/ML injection, Inject 40 Units into the skin daily., Disp: , Rfl:  .  insulin regular (NOVOLIN R,HUMULIN R) 100 units/mL injection, Inject 5 Units into the skin See admin instructions. Inject 5 units under the skin before breakfast and inject 5 units before evening meal, Disp: , Rfl:  .  isosorbide mononitrate (IMDUR) 30 MG 24 hr tablet, Take 2 tablets (60 mg total) by mouth daily., Disp: 60 tablet, Rfl: 3 .  metoprolol succinate (TOPROL-XL) 50 MG 24 hr tablet, Take 1  tablet (50 mg total) by mouth daily., Disp: 30 tablet, Rfl: 3 .  Multiple Vitamins-Minerals (MULTIVITAMIN WITH MINERALS) tablet, Take 1 tablet by mouth daily., Disp: , Rfl:  .  nystatin (MYCOSTATIN) 100000 UNIT/ML suspension, Take 5 mLs (500,000 Units total) by mouth 4 (four) times daily., Disp: 240 mL, Rfl: 0 .  pantoprazole (PROTONIX) 40 MG tablet, Take 1 tablet (40 mg total) by mouth daily., Disp: 30 tablet, Rfl: 11 .  spironolactone (ALDACTONE) 25 MG tablet, Take 0.5 tablets (12.5 mg total) by mouth daily., Disp: 45 tablet, Rfl: 3 .  Tamsulosin HCl (FLOMAX) 0.4 MG CAPS, Take 0.4 mg by mouth Nightly. , Disp: , Rfl:  .  thiamine  100 MG tablet, Take 1 tablet (100 mg total) by mouth daily., Disp: 30 tablet, Rfl: 0 .  guaifenesin (HUMIBID E) 400 MG TABS tablet, Take 400 mg by mouth 3 (three) times daily as needed. For congestion, Disp: , Rfl:  .  nicotine (NICOTROL) 10 MG inhaler, Inhale 1 Cartridge (1 continuous puffing total) into the lungs as needed for smoking cessation. (Patient not taking: Reported on 06/29/2017), Disp: 42 each, Rfl: 0 .  simethicone (MYLICON) 80 MG chewable tablet, Chew 80 mg by mouth 3 (three) times daily as needed for flatulence. Take with meals as needed, Disp: , Rfl:  Allergies  Allergen Reactions  . Penicillins Swelling    Has patient had a PCN reaction causing immediate rash, facial/tongue/throat swelling, SOB or lightheadedness with hypotension: Yes Has patient had a PCN reaction causing severe rash involving mucus membranes or skin necrosis: No Has patient had a PCN reaction that required hospitalization: No Has patient had a PCN reaction occurring within the last 10 years: No If all of the above answers are "NO", then may proceed with Cephalosporin use.     Social History   Social History  . Marital status: Divorced    Spouse name: N/A  . Number of children: N/A  . Years of education: N/A   Occupational History  . retired Architect    Social History Main Topics  . Smoking status: Current Every Day Smoker    Packs/day: 0.50    Years: 50.00    Types: Cigarettes  . Smokeless tobacco: Never Used     Comment: He has a 50-pack-year hx of tobacco abuse. Currently, smoking about half a pack a day.  . Alcohol use Yes     Comment: Previously drank heavily, and now has a drink every other day or so.  . Drug use: Yes    Types: Marijuana, Cocaine  . Sexual activity: No   Other Topics Concern  . Not on file   Social History Narrative  . No narrative on file    Physical Exam      Future Appointments Date Time Provider [place]  08/23/2017 12:00 PM Larey Dresser,  MD [facility] None   BP 98/74   Pulse 72   Resp 15   Wt 186 lb (84.4 kg)   SpO2 96%   BMI 25.23 kg/m  Weight yesterday-184  Last visit weight-186 CBG EMS-204 B/p- sitting 94/palp B/p-standing 86/palp  Pt reports he is doing well, got finasteride and pantoprazole called in to [place], I talked to him about being referred to the home based primary care plan program-he is interested-so I called the [facility] regarding his spiro to get it moved over and spoke to dr borums office about that and he needs a new glucometer also, so that was relayed to the  office as well. He wants a new glucometer from [facility]--  He missed one dose of his meds over the wknd. No swelling noted.  He states he gets some sob upon exertion, no dizziness, he states yesterday he was tired more than usual, and gets unsteady on his feet when standing too long.  His weight was 190 a couple wks ago and he is down about 4-5 lbs from then.  [employer] called pt while I was here and said dr borums office is going to be calling him regarding his meter.   ACTION: Home visit completed  Marylouise Stacks, EMT-Paramedic 07/20/17

## 2017-07-21 ENCOUNTER — Other Ambulatory Visit (HOSPITAL_COMMUNITY): Payer: Self-pay

## 2017-07-21 NOTE — Progress Notes (Signed)
Per clinic there were med changes from his b/p drop yesterday--hydralazine 25mg  TID and isosorbide to 30mg  daily instead of 60mg . I called him yesterday but no answer so came out this morning and made the change to his pill box.   B/p-112/60 HR-78 IO97-35 CBG-275  Marylouise Stacks, EMT-Paramedic 07/21/17

## 2017-07-23 IMAGING — CT CT HEAD W/O CM
2 series · 16 of 30 positions shown, 18 images · non-contrast
Comparison: August 25, 2014

CLINICAL DATA: Lightheaded.  Fell last week.

EXAM:
CT HEAD WITHOUT CONTRAST
TECHNIQUE: Contiguous axial images were obtained from the base of the skull
through the vertex without intravenous contrast.

[Series 201: head w/o, idose (1) · axial · non-contrast · 0.49mm/px · z∈[+141,+261]mm · 8 of 32 slices shown, 10 images]
[im 4/32  brain]
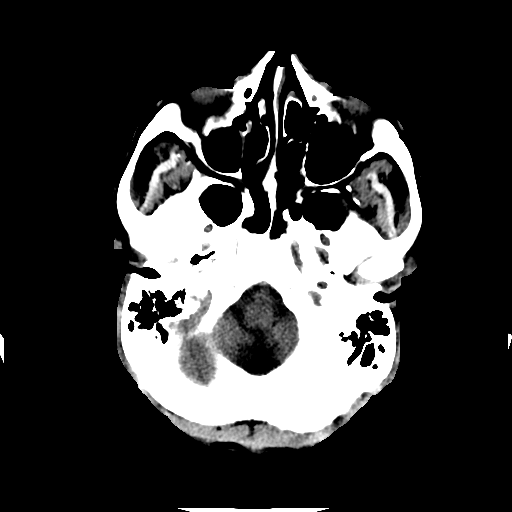
[im 4/32  bone]
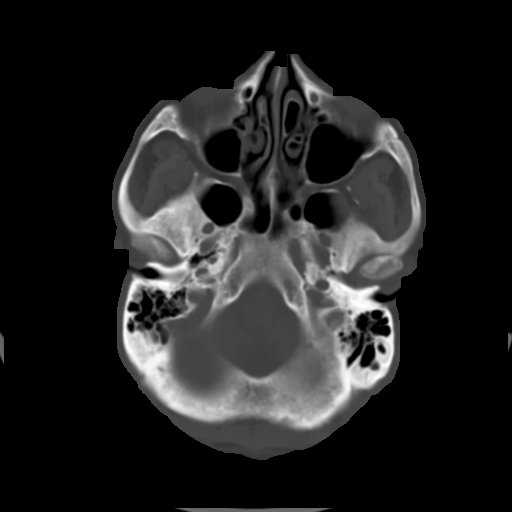
[im 7/32  brain]
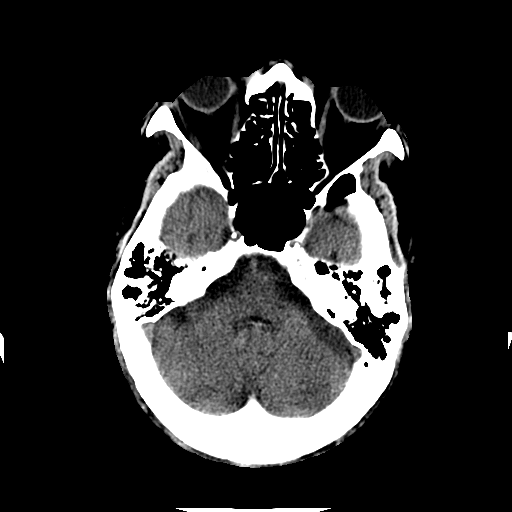
[im 11/32  brain]
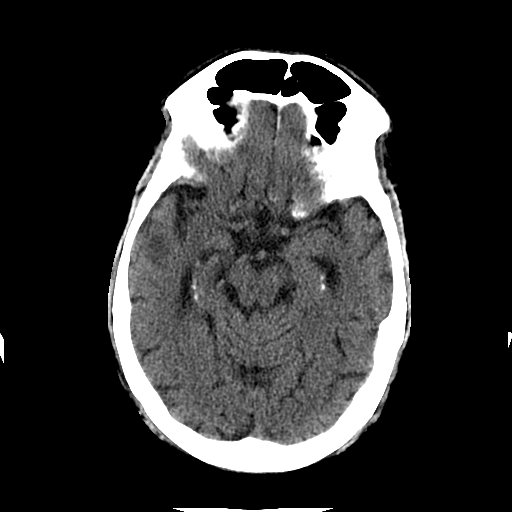
[im 14/32  brain]
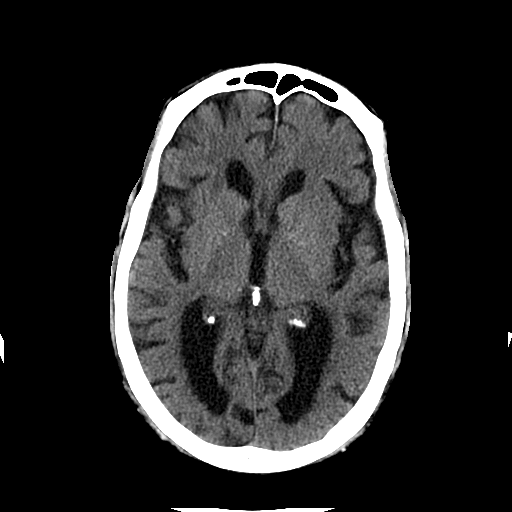
[im 18/32  brain]
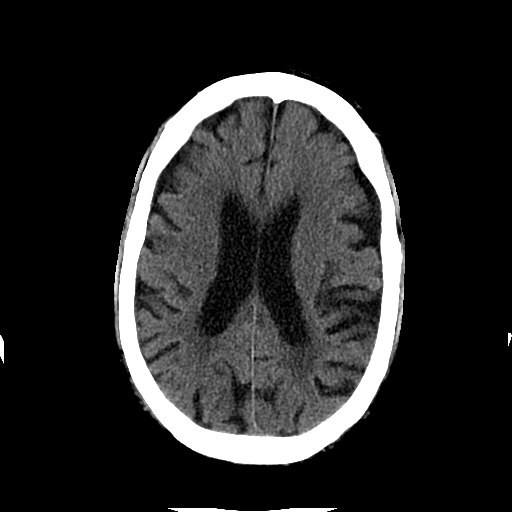
[im 18/32  bone]
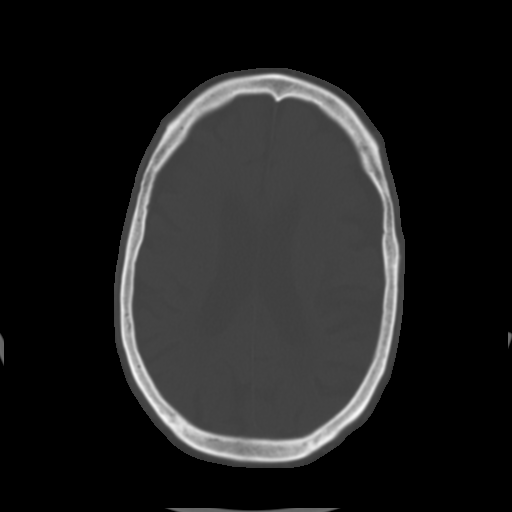
[im 21/32  brain]
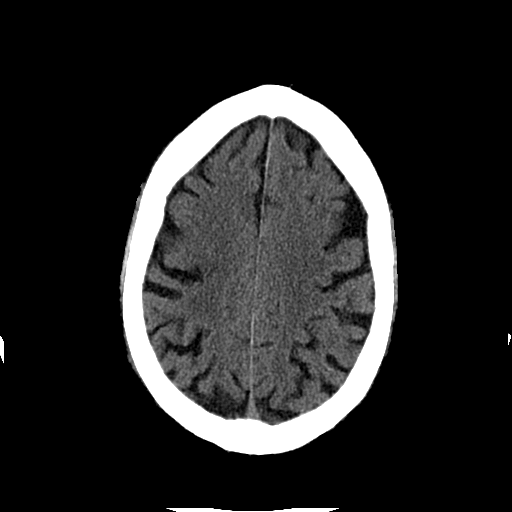
[im 25/32  brain]
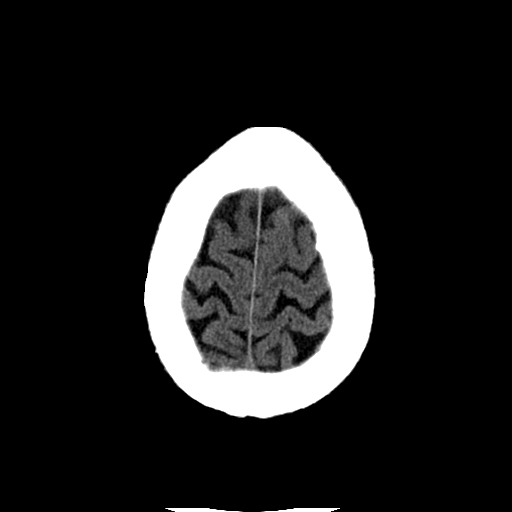
[im 28/32  brain]
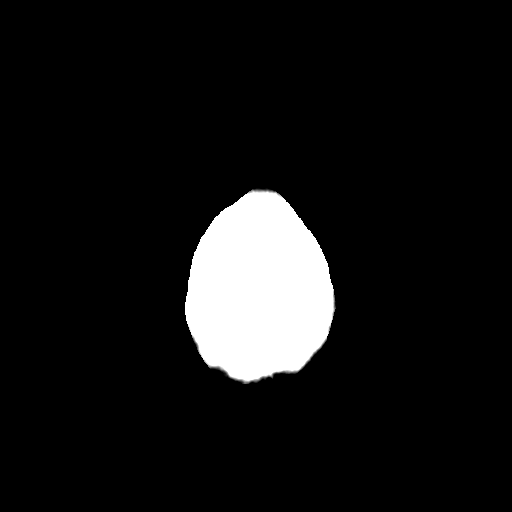

[Series 202: head w/o bone, idose (1) · axial · non-contrast · 0.49mm/px · z∈[+139,+264]mm · 8 of 64 slices shown]
[im 7/64  bone]
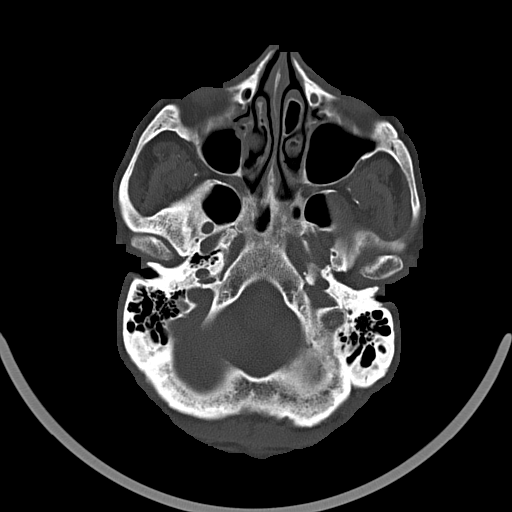
[im 14/64  bone]
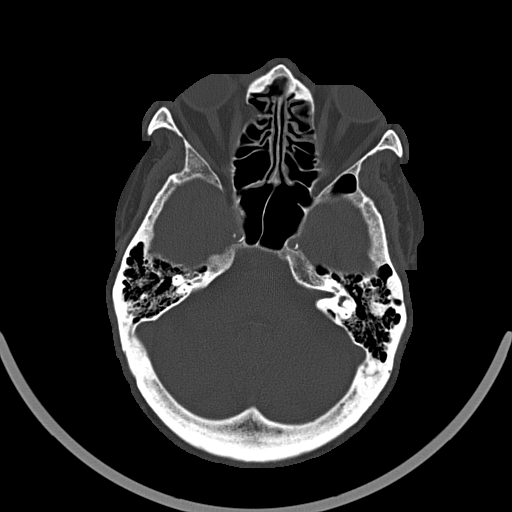
[im 20/64  bone]
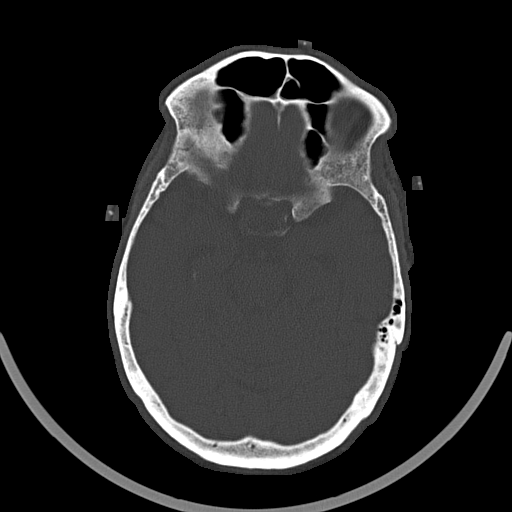
[im 27/64  bone]
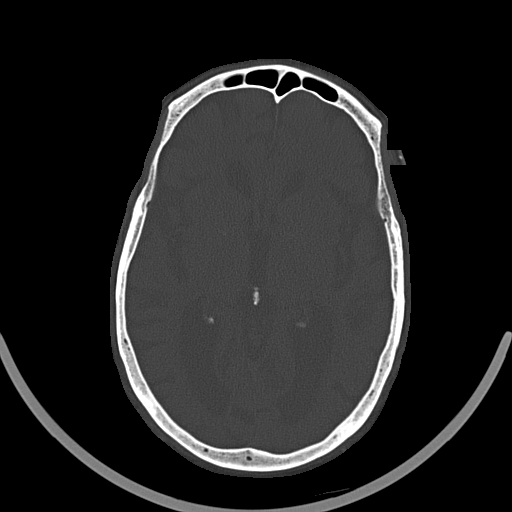
[im 37/64  bone]
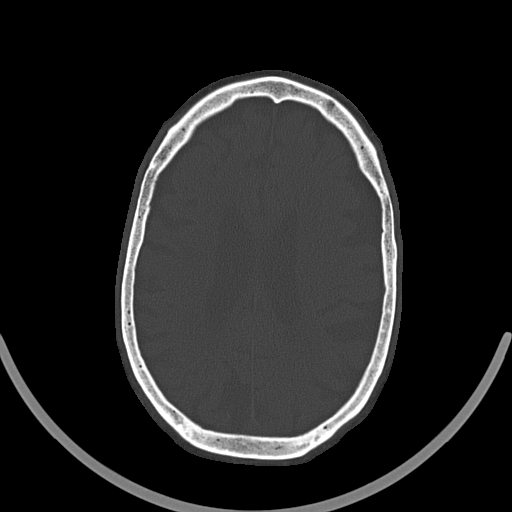
[im 44/64  bone]
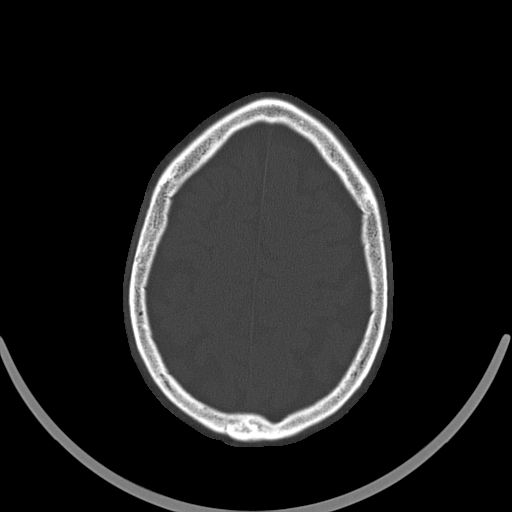
[im 50/64  bone]
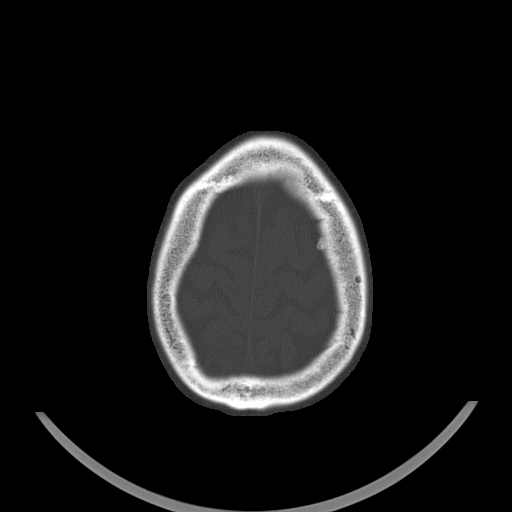
[im 57/64  bone]
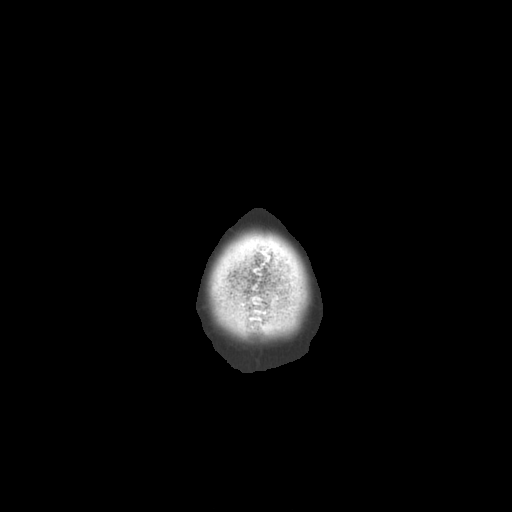

[16 of 30 positions shown; findings below may reference images not displayed]

FINDINGS: Paranasal sinuses are well aerated. A few inferior mastoid air cells
are opacified bilaterally with no overlying bony erosion or soft
tissue swelling. There is a depressed fracture of the medial right
orbit which is stable and chronic. No acute bony abnormalities are
identified. Extracranial soft tissues are within normal limits. No
subdural, epidural, or subarachnoid hemorrhage. No mass, mass
effect, or midline shift. Ventricles and sulci are prominent stable.
Scattered white matter changes are seen, particularly in the
subcortical region on the right on image 20, not appreciated on the
previous study. No acute cortical ischemia or infarct is identified.
IMPRESSION: 1. No acute cortical ischemia or infarct. No bleed. Subcortical
white matter changes on the right, such as on image 20, are age
indeterminate but more conspicuous since July 2014.

## 2017-07-27 ENCOUNTER — Other Ambulatory Visit (HOSPITAL_COMMUNITY): Payer: Self-pay

## 2017-07-27 NOTE — Progress Notes (Signed)
Paramedicine Encounter    Patient ID: [name], male    DOB: August 09, 1936, 81 y.o.   MRN: [id number]   Patient Care Team: Beacher May, MD as PCP - General (Internal Medicine)  Patient Active Problem List   Diagnosis Date Noted  . Chronic systolic CHF (congestive heart failure) ([place]) 10/29/2016  . Bipolar disorder ([place])   . Alcohol abuse 07/09/2016  . Demand ischemia ([place]) 05/11/2016  . Chronic anticoagulation-Eliquis 05/11/2016  . Elevated troponin 05/11/2016  . Symptomatic anemia 05/11/2016  . Anemia 11/28/2015  . CVA (cerebral infarction)-Augb 2015 08/25/2014  . Stroke ([place]) 08/25/2014  . CKD (chronic kidney disease), stage III 07/17/2014  . Tobacco abuse 06/27/2014  . Pulmonary edema   . Chronic atrial fibrillation ([place])   . Edema 08/20/2011  . DM (diabetes mellitus), type 2 with renal complications ([place]) 62/70/3500  . Hyperlipidemia associated with type 2 diabetes mellitus ([place]) 03/10/2010  . Essential hypertension 03/10/2010  . MYOCARDIAL INFARCTION 03/10/2010  . CAD- last PCI 2011 03/10/2010  . PVD 03/10/2010  . GERD 03/10/2010    Current Outpatient Prescriptions:  .  acetaminophen (TYLENOL) 325 MG tablet, Take 650 mg by mouth every 8 (eight) hours as needed for mild pain or fever (pain). , Disp: , Rfl:  .  albuterol (PROVENTIL HFA;VENTOLIN HFA) 108 (90 BASE) MCG/ACT inhaler, Inhale 2 puffs into the lungs every 6 (six) hours as needed for shortness of breath., Disp: , Rfl:  .  ammonium lactate (LAC-HYDRIN) 12 % lotion, Apply 1 application topically daily., Disp: , Rfl:  .  apixaban (ELIQUIS) 5 MG TABS tablet, Take 1 tablet (5 mg total) by mouth every 12 (twelve) hours., Disp: 60 tablet, Rfl: 3 .  atorvastatin (LIPITOR) 40 MG tablet, Take 1 tablet (40 mg total) by mouth daily. (Patient taking differently: Take 40 mg by mouth at bedtime. ), Disp: 90 tablet, Rfl: 3 .  dextrose (GLUTOSE) 40 % GEL, Take 1 Tube by mouth daily as needed for low blood sugar. , Disp: ,  Rfl:  .  feeding supplement, GLUCERNA SHAKE, (GLUCERNA SHAKE) LIQD, Take 237 mLs by mouth 3 (three) times daily between meals., Disp: , Rfl:  .  ferrous sulfate 325 (65 FE) MG tablet, Take 325 mg by mouth 2 (two) times daily., Disp: , Rfl:  .  finasteride (PROSCAR) 5 MG tablet, Take 5 mg by mouth daily.  , Disp: , Rfl:  .  folic acid (FOLVITE) 1 MG tablet, Take 1 tablet (1 mg total) by mouth daily., Disp: 30 tablet, Rfl: 0 .  furosemide (LASIX) 20 MG tablet, Take 40 mg (2 Tablets) the AM and 20 mg (1 Tablet) in the PM, Disp: 90 tablet, Rfl: 3 .  gabapentin (NEURONTIN) 300 MG capsule, Take 300 mg by mouth 2 (two) times daily. , Disp: , Rfl:  .  hydrALAZINE (APRESOLINE) 25 MG tablet, Take 1 tablet (25 mg total) by mouth 3 (three) times daily., Disp: 90 tablet, Rfl: 6 .  insulin glargine (LANTUS) 100 UNIT/ML injection, Inject 40 Units into the skin daily., Disp: , Rfl:  .  insulin regular (NOVOLIN R,HUMULIN R) 100 units/mL injection, Inject 5 Units into the skin See admin instructions. Inject 5 units under the skin before breakfast and inject 5 units before evening meal, Disp: , Rfl:  .  isosorbide mononitrate (IMDUR) 30 MG 24 hr tablet, Take 1 tablet (30 mg total) by mouth daily., Disp: 60 tablet, Rfl: 3 .  metoprolol succinate (TOPROL-XL) 50 MG 24 hr tablet, Take 1  tablet (50 mg total) by mouth daily., Disp: 30 tablet, Rfl: 3 .  Multiple Vitamins-Minerals (MULTIVITAMIN WITH MINERALS) tablet, Take 1 tablet by mouth daily., Disp: , Rfl:  .  nystatin (MYCOSTATIN) 100000 UNIT/ML suspension, Take 5 mLs (500,000 Units total) by mouth 4 (four) times daily., Disp: 240 mL, Rfl: 0 .  pantoprazole (PROTONIX) 40 MG tablet, Take 1 tablet (40 mg total) by mouth daily., Disp: 30 tablet, Rfl: 11 .  spironolactone (ALDACTONE) 25 MG tablet, Take 0.5 tablets (12.5 mg total) by mouth daily., Disp: 45 tablet, Rfl: 3 .  Tamsulosin HCl (FLOMAX) 0.4 MG CAPS, Take 0.4 mg by mouth Nightly. , Disp: , Rfl:  .  thiamine 100 MG  tablet, Take 1 tablet (100 mg total) by mouth daily., Disp: 30 tablet, Rfl: 0 .  guaifenesin (HUMIBID E) 400 MG TABS tablet, Take 400 mg by mouth 3 (three) times daily as needed. For congestion, Disp: , Rfl:  .  nicotine (NICOTROL) 10 MG inhaler, Inhale 1 Cartridge (1 continuous puffing total) into the lungs as needed for smoking cessation. (Patient not taking: Reported on 06/29/2017), Disp: 42 each, Rfl: 0 .  simethicone (MYLICON) 80 MG chewable tablet, Chew 80 mg by mouth 3 (three) times daily as needed for flatulence. Take with meals as needed, Disp: , Rfl:  Allergies  Allergen Reactions  . Penicillins Swelling    Has patient had a PCN reaction causing immediate rash, facial/tongue/throat swelling, SOB or lightheadedness with hypotension: Yes Has patient had a PCN reaction causing severe rash involving mucus membranes or skin necrosis: No Has patient had a PCN reaction that required hospitalization: No Has patient had a PCN reaction occurring within the last 10 years: No If all of the above answers are "NO", then may proceed with Cephalosporin use.     Social History   Social History  . Marital status: Divorced    Spouse name: N/A  . Number of children: N/A  . Years of education: N/A   Occupational History  . retired Architect    Social History Main Topics  . Smoking status: Current Every Day Smoker    Packs/day: 0.50    Years: 50.00    Types: Cigarettes  . Smokeless tobacco: Never Used     Comment: He has a 50-pack-year hx of tobacco abuse. Currently, smoking about half a pack a day.  . Alcohol use Yes     Comment: Previously drank heavily, and now has a drink every other day or so.  . Drug use: Yes    Types: Marijuana, Cocaine  . Sexual activity: No   Other Topics Concern  . Not on file   Social History Narrative  . No narrative on file    Physical Exam      Future Appointments Date Time Provider [place]  08/23/2017 12:00 PM Larey Dresser, MD  [facility] None   BP 114/60   Pulse 80   Resp 15   Wt 188 lb (85.3 kg)   BMI 25.50 kg/m  Weight yesterday-187.8  Last visit weight-186 CBG EMS-219  Pt reports he is doing well, he has a glucometer that was given to his daughter and she brought it to him.  Pt wants to go over his meds at next visit so he can become more familiar with what he is taking after things settle down here for him. He needs a ride for aug 30 @ 11 appointment at [place].   He needs some refills:   [id number] on  file  [phone] on file  [phone] on file   [phone] on file   [phone] on file   [phone] been cancelled Had to call and speak with someone in pharmacy to get the meds refilled.  [phone]  ACTION: Home visit completed  Marylouise Stacks, EMT-Paramedic 07/27/17

## 2017-08-03 ENCOUNTER — Other Ambulatory Visit (HOSPITAL_COMMUNITY): Payer: Self-pay

## 2017-08-03 NOTE — Progress Notes (Signed)
Paramedicine Encounter    Patient ID: [name], male    DOB: 1936/03/17, 81 y.o.   MRN: [id number]   Patient Care Team: Beacher May, MD as PCP - General (Internal Medicine)  Patient Active Problem List   Diagnosis Date Noted  . Chronic systolic CHF (congestive heart failure) ([place]) 10/29/2016  . Bipolar disorder ([place])   . Alcohol abuse 07/09/2016  . Demand ischemia ([place]) 05/11/2016  . Chronic anticoagulation-Eliquis 05/11/2016  . Elevated troponin 05/11/2016  . Symptomatic anemia 05/11/2016  . Anemia 11/28/2015  . CVA (cerebral infarction)-Augb 2015 08/25/2014  . Stroke ([place]) 08/25/2014  . CKD (chronic kidney disease), stage III 07/17/2014  . Tobacco abuse 06/27/2014  . Pulmonary edema   . Chronic atrial fibrillation ([place])   . Edema 08/20/2011  . DM (diabetes mellitus), type 2 with renal complications ([place]) 06/20/7627  . Hyperlipidemia associated with type 2 diabetes mellitus ([place]) 03/10/2010  . Essential hypertension 03/10/2010  . MYOCARDIAL INFARCTION 03/10/2010  . CAD- last PCI 2011 03/10/2010  . PVD 03/10/2010  . GERD 03/10/2010    Current Outpatient Prescriptions:  .  acetaminophen (TYLENOL) 325 MG tablet, Take 650 mg by mouth every 8 (eight) hours as needed for mild pain or fever (pain). , Disp: , Rfl:  .  albuterol (PROVENTIL HFA;VENTOLIN HFA) 108 (90 BASE) MCG/ACT inhaler, Inhale 2 puffs into the lungs every 6 (six) hours as needed for shortness of breath., Disp: , Rfl:  .  ammonium lactate (LAC-HYDRIN) 12 % lotion, Apply 1 application topically daily., Disp: , Rfl:  .  apixaban (ELIQUIS) 5 MG TABS tablet, Take 1 tablet (5 mg total) by mouth every 12 (twelve) hours., Disp: 60 tablet, Rfl: 3 .  atorvastatin (LIPITOR) 40 MG tablet, Take 1 tablet (40 mg total) by mouth daily. (Patient taking differently: Take 40 mg by mouth at bedtime. ), Disp: 90 tablet, Rfl: 3 .  dextrose (GLUTOSE) 40 % GEL, Take 1 Tube by mouth daily as needed for low blood sugar. , Disp: ,  Rfl:  .  feeding supplement, GLUCERNA SHAKE, (GLUCERNA SHAKE) LIQD, Take 237 mLs by mouth 3 (three) times daily between meals., Disp: , Rfl:  .  ferrous sulfate 325 (65 FE) MG tablet, Take 325 mg by mouth 2 (two) times daily., Disp: , Rfl:  .  finasteride (PROSCAR) 5 MG tablet, Take 5 mg by mouth daily.  , Disp: , Rfl:  .  folic acid (FOLVITE) 1 MG tablet, Take 1 tablet (1 mg total) by mouth daily., Disp: 30 tablet, Rfl: 0 .  furosemide (LASIX) 20 MG tablet, Take 40 mg (2 Tablets) the AM and 20 mg (1 Tablet) in the PM, Disp: 90 tablet, Rfl: 3 .  gabapentin (NEURONTIN) 300 MG capsule, Take 300 mg by mouth 2 (two) times daily. , Disp: , Rfl:  .  hydrALAZINE (APRESOLINE) 25 MG tablet, Take 1 tablet (25 mg total) by mouth 3 (three) times daily., Disp: 90 tablet, Rfl: 6 .  insulin glargine (LANTUS) 100 UNIT/ML injection, Inject 40 Units into the skin daily., Disp: , Rfl:  .  insulin regular (NOVOLIN R,HUMULIN R) 100 units/mL injection, Inject 5 Units into the skin See admin instructions. Inject 5 units under the skin before breakfast and inject 5 units before evening meal, Disp: , Rfl:  .  isosorbide mononitrate (IMDUR) 30 MG 24 hr tablet, Take 1 tablet (30 mg total) by mouth daily., Disp: 60 tablet, Rfl: 3 .  metoprolol succinate (TOPROL-XL) 50 MG 24 hr tablet, Take 1  tablet (50 mg total) by mouth daily., Disp: 30 tablet, Rfl: 3 .  Multiple Vitamins-Minerals (MULTIVITAMIN WITH MINERALS) tablet, Take 1 tablet by mouth daily., Disp: , Rfl:  .  pantoprazole (PROTONIX) 40 MG tablet, Take 1 tablet (40 mg total) by mouth daily., Disp: 30 tablet, Rfl: 11 .  simethicone (MYLICON) 80 MG chewable tablet, Chew 80 mg by mouth 3 (three) times daily as needed for flatulence. Take with meals as needed, Disp: , Rfl:  .  spironolactone (ALDACTONE) 25 MG tablet, Take 0.5 tablets (12.5 mg total) by mouth daily., Disp: 45 tablet, Rfl: 3 .  Tamsulosin HCl (FLOMAX) 0.4 MG CAPS, Take 0.4 mg by mouth Nightly. , Disp: , Rfl:  .   thiamine 100 MG tablet, Take 1 tablet (100 mg total) by mouth daily., Disp: 30 tablet, Rfl: 0 .  guaifenesin (HUMIBID E) 400 MG TABS tablet, Take 400 mg by mouth 3 (three) times daily as needed. For congestion, Disp: , Rfl:  .  nicotine (NICOTROL) 10 MG inhaler, Inhale 1 Cartridge (1 continuous puffing total) into the lungs as needed for smoking cessation. (Patient not taking: Reported on 06/29/2017), Disp: 42 each, Rfl: 0 .  nystatin (MYCOSTATIN) 100000 UNIT/ML suspension, Take 5 mLs (500,000 Units total) by mouth 4 (four) times daily. (Patient not taking: Reported on 08/03/2017), Disp: 240 mL, Rfl: 0 Allergies  Allergen Reactions  . Penicillins Swelling    Has patient had a PCN reaction causing immediate rash, facial/tongue/throat swelling, SOB or lightheadedness with hypotension: Yes Has patient had a PCN reaction causing severe rash involving mucus membranes or skin necrosis: No Has patient had a PCN reaction that required hospitalization: No Has patient had a PCN reaction occurring within the last 10 years: No If all of the above answers are "NO", then may proceed with Cephalosporin use.     Social History   Social History  . Marital status: Divorced    Spouse name: N/A  . Number of children: N/A  . Years of education: N/A   Occupational History  . retired Architect    Social History Main Topics  . Smoking status: Current Every Day Smoker    Packs/day: 0.50    Years: 50.00    Types: Cigarettes  . Smokeless tobacco: Never Used     Comment: He has a 50-pack-year hx of tobacco abuse. Currently, smoking about half a pack a day.  . Alcohol use Yes     Comment: Previously drank heavily, and now has a drink every other day or so.  . Drug use: Yes    Types: Marijuana, Cocaine  . Sexual activity: No   Other Topics Concern  . Not on file   Social History Narrative  . No narrative on file    Physical Exam  Constitutional: He is oriented to person, place, and time.   Cardiovascular: Normal rate and regular rhythm.   Pulmonary/Chest: Effort normal. No respiratory distress. He has wheezes in the right upper field.  Musculoskeletal: Normal range of motion. He exhibits no edema.  Neurological: He is alert and oriented to person, place, and time.        Future Appointments Date Time Provider [place]  08/23/2017 12:00 PM Larey Dresser, MD [facility] None   BP 130/80 (BP Location: Right Arm, Patient Position: Sitting, Cuff Size: Normal)   Pulse 72   Resp 18   Wt 186 lb 9.6 oz (84.6 kg)   SpO2 96%   BMI 25.31 kg/m  Weight yesterday- 189.0  lbs Last visit weight- 188 lbs CBG: 432 mg/dl  Mr Vigen was seen at home today and reports feeling generally well. He denied SOB, H/A, dizziness or orthopnea. He reports taking all of his medications. He was noted to have an expiratory wheeze in his RUF. When I mentioned this to him, he stated he was not SOB but felt like he had some congestion that needed to come out. I advised him to use his inhaler to help dilate his airways and hopefully clear out any congestion as well as help any bronchospasm which may be present. HI sugar was also running hit this morning despite having taken his long acting insulin at 07:00. In fact his CBG was up from the time he took the insulin this morning. He felt comfortable handling this issue on his own by administering short acting insulin in accordance with his physicians orders. Medications were verified and his pillbox was refilled.   Time with patient: 42 minutes  Jacquiline Doe, EMT 08/03/17  ACTION: Home visit completed Next visit planned for 1 week

## 2017-08-10 ENCOUNTER — Other Ambulatory Visit (HOSPITAL_COMMUNITY): Payer: Self-pay

## 2017-08-10 ENCOUNTER — Encounter (HOSPITAL_COMMUNITY): Payer: Self-pay

## 2017-08-10 NOTE — Progress Notes (Signed)
Temporary handicap placard form completed and signed by Dr. Aundra Dubin on behalf of patient for 6 month placard. Katie with paramedicine to deliver to patient.  Renee Pain, RN

## 2017-08-10 NOTE — Progress Notes (Signed)
Paramedicine Encounter    Patient ID: [name], male    DOB: [birth year], 81 y.o.   MRN: [id number]   Patient Care Team: Beacher May, MD as PCP - General (Internal Medicine)  Patient Active Problem List   Diagnosis Date Noted  . Chronic systolic CHF (congestive heart failure) ([place]) 10/29/2016  . Bipolar disorder ([place])   . Alcohol abuse 07/09/2016  . Demand ischemia ([place]) 05/11/2016  . Chronic anticoagulation-Eliquis 05/11/2016  . Elevated troponin 05/11/2016  . Symptomatic anemia 05/11/2016  . Anemia 11/28/2015  . CVA (cerebral infarction)-Augb 2015 08/25/2014  . Stroke ([place]) 08/25/2014  . CKD (chronic kidney disease), stage III 07/17/2014  . Tobacco abuse 06/27/2014  . Pulmonary edema   . Chronic atrial fibrillation ([place])   . Edema 08/20/2011  . DM (diabetes mellitus), type 2 with renal complications ([place]) 77/41/2878  . Hyperlipidemia associated with type 2 diabetes mellitus ([place]) 03/10/2010  . Essential hypertension 03/10/2010  . MYOCARDIAL INFARCTION 03/10/2010  . CAD- last PCI 2011 03/10/2010  . PVD 03/10/2010  . GERD 03/10/2010    Current Outpatient Prescriptions:  .  acetaminophen (TYLENOL) 325 MG tablet, Take 650 mg by mouth every 8 (eight) hours as needed for mild pain or fever (pain). , Disp: , Rfl:  .  albuterol (PROVENTIL HFA;VENTOLIN HFA) 108 (90 BASE) MCG/ACT inhaler, Inhale 2 puffs into the lungs every 6 (six) hours as needed for shortness of breath., Disp: , Rfl:  .  ammonium lactate (LAC-HYDRIN) 12 % lotion, Apply 1 application topically daily., Disp: , Rfl:  .  apixaban (ELIQUIS) 5 MG TABS tablet, Take 1 tablet (5 mg total) by mouth every 12 (twelve) hours., Disp: 60 tablet, Rfl: 3 .  atorvastatin (LIPITOR) 40 MG tablet, Take 1 tablet (40 mg total) by mouth daily. (Patient taking differently: Take 40 mg by mouth at bedtime. ), Disp: 90 tablet, Rfl: 3 .  dextrose (GLUTOSE) 40 % GEL, Take 1 Tube by mouth daily as needed for low blood sugar. , Disp: ,  Rfl:  .  feeding supplement, GLUCERNA SHAKE, (GLUCERNA SHAKE) LIQD, Take 237 mLs by mouth 3 (three) times daily between meals., Disp: , Rfl:  .  ferrous sulfate 325 (65 FE) MG tablet, Take 325 mg by mouth 2 (two) times daily., Disp: , Rfl:  .  finasteride (PROSCAR) 5 MG tablet, Take 5 mg by mouth daily.  , Disp: , Rfl:  .  folic acid (FOLVITE) 1 MG tablet, Take 1 tablet (1 mg total) by mouth daily., Disp: 30 tablet, Rfl: 0 .  furosemide (LASIX) 20 MG tablet, Take 40 mg (2 Tablets) the AM and 20 mg (1 Tablet) in the PM, Disp: 90 tablet, Rfl: 3 .  gabapentin (NEURONTIN) 300 MG capsule, Take 300 mg by mouth 2 (two) times daily. , Disp: , Rfl:  .  hydrALAZINE (APRESOLINE) 25 MG tablet, Take 1 tablet (25 mg total) by mouth 3 (three) times daily., Disp: 90 tablet, Rfl: 6 .  insulin glargine (LANTUS) 100 UNIT/ML injection, Inject 40 Units into the skin daily., Disp: , Rfl:  .  insulin regular (NOVOLIN R,HUMULIN R) 100 units/mL injection, Inject 5 Units into the skin See admin instructions. Inject 5 units under the skin before breakfast and inject 5 units before evening meal, Disp: , Rfl:  .  isosorbide mononitrate (IMDUR) 30 MG 24 hr tablet, Take 1 tablet (30 mg total) by mouth daily., Disp: 60 tablet, Rfl: 3 .  metoprolol succinate (TOPROL-XL) 50 MG 24 hr tablet, Take 1  tablet (50 mg total) by mouth daily., Disp: 30 tablet, Rfl: 3 .  Multiple Vitamins-Minerals (MULTIVITAMIN WITH MINERALS) tablet, Take 1 tablet by mouth daily., Disp: , Rfl:  .  pantoprazole (PROTONIX) 40 MG tablet, Take 1 tablet (40 mg total) by mouth daily., Disp: 30 tablet, Rfl: 11 .  spironolactone (ALDACTONE) 25 MG tablet, Take 0.5 tablets (12.5 mg total) by mouth daily., Disp: 45 tablet, Rfl: 3 .  Tamsulosin HCl (FLOMAX) 0.4 MG CAPS, Take 0.4 mg by mouth Nightly. , Disp: , Rfl:  .  thiamine 100 MG tablet, Take 1 tablet (100 mg total) by mouth daily., Disp: 30 tablet, Rfl: 0 .  guaifenesin (HUMIBID E) 400 MG TABS tablet, Take 400 mg by  mouth 3 (three) times daily as needed. For congestion, Disp: , Rfl:  .  nicotine (NICOTROL) 10 MG inhaler, Inhale 1 Cartridge (1 continuous puffing total) into the lungs as needed for smoking cessation. (Patient not taking: Reported on 06/29/2017), Disp: 42 each, Rfl: 0 .  nystatin (MYCOSTATIN) 100000 UNIT/ML suspension, Take 5 mLs (500,000 Units total) by mouth 4 (four) times daily. (Patient not taking: Reported on 08/03/2017), Disp: 240 mL, Rfl: 0 .  simethicone (MYLICON) 80 MG chewable tablet, Chew 80 mg by mouth 3 (three) times daily as needed for flatulence. Take with meals as needed, Disp: , Rfl:  Allergies  Allergen Reactions  . Penicillins Swelling    Has patient had a PCN reaction causing immediate rash, facial/tongue/throat swelling, SOB or lightheadedness with hypotension: Yes Has patient had a PCN reaction causing severe rash involving mucus membranes or skin necrosis: No Has patient had a PCN reaction that required hospitalization: No Has patient had a PCN reaction occurring within the last 10 years: No If all of the above answers are "NO", then may proceed with Cephalosporin use.     Social History   Social History  . Marital status: Divorced    Spouse name: N/A  . Number of children: N/A  . Years of education: N/A   Occupational History  . retired Architect    Social History Main Topics  . Smoking status: Current Every Day Smoker    Packs/day: 0.50    Years: 50.00    Types: Cigarettes  . Smokeless tobacco: Never Used     Comment: He has a 50-pack-year hx of tobacco abuse. Currently, smoking about half a pack a day.  . Alcohol use Yes     Comment: Previously drank heavily, and now has a drink every other day or so.  . Drug use: Yes    Types: Marijuana, Cocaine  . Sexual activity: No   Other Topics Concern  . Not on file   Social History Narrative  . No narrative on file    Physical Exam      Future Appointments Date Time Provider [place]   08/23/2017 12:00 PM Larey Dresser, MD [facility] None   BP (!) 130/54   Pulse 84   Resp 16   SpO2 96%  Weight yesterday-188 Last visit weight-186  When I arrived pt was in bathroom, upon walking back to bedroom he got wobbly-he felt dizzy and legs weak, upon checking v/s they were all w/in norm limits. Once he sat down that feeling subsided, he states he didn't sleep good last night and got high last night, and sometimes this occurs the next day. No sob. No h/a. Pt wants to hold off on getting up again to weigh-I advised him to call me if  its more than 3lbs from yesterday.  meds verified and pill box refilled. No swelling noted. Pt requesting handicap sticker for his transportation.     ACTION: Home visit completed Next visit planned for next week   Marylouise Stacks, EMT-Paramedic 08/10/17

## 2017-08-17 ENCOUNTER — Other Ambulatory Visit (HOSPITAL_COMMUNITY): Payer: Self-pay

## 2017-08-17 NOTE — Progress Notes (Signed)
Paramedicine Encounter    Patient ID: [name], male    DOB: 1936-07-18, 81 y.o.   MRN: [id number]   Patient Care Team: Beacher May, MD as PCP - General (Internal Medicine)  Patient Active Problem List   Diagnosis Date Noted  . Chronic systolic CHF (congestive heart failure) ([place]) 10/29/2016  . Bipolar disorder ([place])   . Alcohol abuse 07/09/2016  . Demand ischemia ([place]) 05/11/2016  . Chronic anticoagulation-Eliquis 05/11/2016  . Elevated troponin 05/11/2016  . Symptomatic anemia 05/11/2016  . Anemia 11/28/2015  . CVA (cerebral infarction)-Augb 2015 08/25/2014  . Stroke ([place]) 08/25/2014  . CKD (chronic kidney disease), stage III 07/17/2014  . Tobacco abuse 06/27/2014  . Pulmonary edema   . Chronic atrial fibrillation ([place])   . Edema 08/20/2011  . DM (diabetes mellitus), type 2 with renal complications ([place]) 46/96/2952  . Hyperlipidemia associated with type 2 diabetes mellitus ([place]) 03/10/2010  . Essential hypertension 03/10/2010  . MYOCARDIAL INFARCTION 03/10/2010  . CAD- last PCI 2011 03/10/2010  . PVD 03/10/2010  . GERD 03/10/2010    Current Outpatient Prescriptions:  .  acetaminophen (TYLENOL) 325 MG tablet, Take 650 mg by mouth every 8 (eight) hours as needed for mild pain or fever (pain). , Disp: , Rfl:  .  albuterol (PROVENTIL HFA;VENTOLIN HFA) 108 (90 BASE) MCG/ACT inhaler, Inhale 2 puffs into the lungs every 6 (six) hours as needed for shortness of breath., Disp: , Rfl:  .  ammonium lactate (LAC-HYDRIN) 12 % lotion, Apply 1 application topically daily., Disp: , Rfl:  .  apixaban (ELIQUIS) 5 MG TABS tablet, Take 1 tablet (5 mg total) by mouth every 12 (twelve) hours., Disp: 60 tablet, Rfl: 3 .  atorvastatin (LIPITOR) 40 MG tablet, Take 1 tablet (40 mg total) by mouth daily. (Patient taking differently: Take 40 mg by mouth at bedtime. ), Disp: 90 tablet, Rfl: 3 .  dextrose (GLUTOSE) 40 % GEL, Take 1 Tube by mouth daily as needed for low blood sugar. , Disp: ,  Rfl:  .  feeding supplement, GLUCERNA SHAKE, (GLUCERNA SHAKE) LIQD, Take 237 mLs by mouth 3 (three) times daily between meals., Disp: , Rfl:  .  ferrous sulfate 325 (65 FE) MG tablet, Take 325 mg by mouth 2 (two) times daily., Disp: , Rfl:  .  finasteride (PROSCAR) 5 MG tablet, Take 5 mg by mouth daily.  , Disp: , Rfl:  .  folic acid (FOLVITE) 1 MG tablet, Take 1 tablet (1 mg total) by mouth daily., Disp: 30 tablet, Rfl: 0 .  furosemide (LASIX) 20 MG tablet, Take 40 mg (2 Tablets) the AM and 20 mg (1 Tablet) in the PM, Disp: 90 tablet, Rfl: 3 .  gabapentin (NEURONTIN) 300 MG capsule, Take 300 mg by mouth 2 (two) times daily. , Disp: , Rfl:  .  hydrALAZINE (APRESOLINE) 25 MG tablet, Take 1 tablet (25 mg total) by mouth 3 (three) times daily., Disp: 90 tablet, Rfl: 6 .  insulin glargine (LANTUS) 100 UNIT/ML injection, Inject 40 Units into the skin daily., Disp: , Rfl:  .  insulin regular (NOVOLIN R,HUMULIN R) 100 units/mL injection, Inject 5 Units into the skin See admin instructions. Inject 5 units under the skin before breakfast and inject 5 units before evening meal, Disp: , Rfl:  .  isosorbide mononitrate (IMDUR) 30 MG 24 hr tablet, Take 1 tablet (30 mg total) by mouth daily., Disp: 60 tablet, Rfl: 3 .  metoprolol succinate (TOPROL-XL) 50 MG 24 hr tablet, Take 1  tablet (50 mg total) by mouth daily., Disp: 30 tablet, Rfl: 3 .  Multiple Vitamins-Minerals (MULTIVITAMIN WITH MINERALS) tablet, Take 1 tablet by mouth daily., Disp: , Rfl:  .  pantoprazole (PROTONIX) 40 MG tablet, Take 1 tablet (40 mg total) by mouth daily., Disp: 30 tablet, Rfl: 11 .  spironolactone (ALDACTONE) 25 MG tablet, Take 0.5 tablets (12.5 mg total) by mouth daily., Disp: 45 tablet, Rfl: 3 .  Tamsulosin HCl (FLOMAX) 0.4 MG CAPS, Take 0.4 mg by mouth Nightly. , Disp: , Rfl:  .  thiamine 100 MG tablet, Take 1 tablet (100 mg total) by mouth daily., Disp: 30 tablet, Rfl: 0 .  guaifenesin (HUMIBID E) 400 MG TABS tablet, Take 400 mg by  mouth 3 (three) times daily as needed. For congestion, Disp: , Rfl:  .  nicotine (NICOTROL) 10 MG inhaler, Inhale 1 Cartridge (1 continuous puffing total) into the lungs as needed for smoking cessation. (Patient not taking: Reported on 06/29/2017), Disp: 42 each, Rfl: 0 .  nystatin (MYCOSTATIN) 100000 UNIT/ML suspension, Take 5 mLs (500,000 Units total) by mouth 4 (four) times daily. (Patient not taking: Reported on 08/03/2017), Disp: 240 mL, Rfl: 0 .  simethicone (MYLICON) 80 MG chewable tablet, Chew 80 mg by mouth 3 (three) times daily as needed for flatulence. Take with meals as needed, Disp: , Rfl:  Allergies  Allergen Reactions  . Penicillins Swelling    Has patient had a PCN reaction causing immediate rash, facial/tongue/throat swelling, SOB or lightheadedness with hypotension: Yes Has patient had a PCN reaction causing severe rash involving mucus membranes or skin necrosis: No Has patient had a PCN reaction that required hospitalization: No Has patient had a PCN reaction occurring within the last 10 years: No If all of the above answers are "NO", then may proceed with Cephalosporin use.     Social History   Social History  . Marital status: Divorced    Spouse name: N/A  . Number of children: N/A  . Years of education: N/A   Occupational History  . retired Architect    Social History Main Topics  . Smoking status: Current Every Day Smoker    Packs/day: 0.50    Years: 50.00    Types: Cigarettes  . Smokeless tobacco: Never Used     Comment: He has a 50-pack-year hx of tobacco abuse. Currently, smoking about half a pack a day.  . Alcohol use Yes     Comment: Previously drank heavily, and now has a drink every other day or so.  . Drug use: Yes    Types: Marijuana, Cocaine  . Sexual activity: No   Other Topics Concern  . Not on file   Social History Narrative  . No narrative on file    Physical Exam      Future Appointments Date Time Provider [place]   08/23/2017 12:00 PM Larey Dresser, MD [facility] None   BP (!) 132/58   Pulse 62   Resp 15   Wt 185 lb (83.9 kg)   SpO2 96%   BMI 25.09 kg/m  Weight yesterday-187.2 Last visit weight-186 CBG PTA-148   Pt reports he is feeling good, he fell asleep yesterday and slept through his afternoon dose of meds, when he woke up it was 8pm so he didn't take his night time dose just his afternoon dose.  meds verified and pill box refilled. No sob, no dizziness. Lungs clear, no swelling noted.   ACTION: Home visit completed  Marylouise Stacks,  EMT-Paramedic 08/17/17

## 2017-08-23 ENCOUNTER — Other Ambulatory Visit (HOSPITAL_COMMUNITY): Payer: Self-pay

## 2017-08-23 ENCOUNTER — Ambulatory Visit (HOSPITAL_COMMUNITY)
Admission: RE | Admit: 2017-08-23 | Discharge: 2017-08-23 | Disposition: A | Discharge status: Home / Self Care | Payer: Medicare Other | Source: Ambulatory Visit | Attending: Cardiology | Admitting: Cardiology

## 2017-08-23 ENCOUNTER — Encounter (HOSPITAL_COMMUNITY): Payer: Self-pay | Admitting: Cardiology

## 2017-08-23 VITALS — BP 88/54 | HR 76 | Wt 186.2 lb

## 2017-08-23 DIAGNOSIS — I739 Peripheral vascular disease, unspecified: Secondary | ICD-10-CM | POA: Insufficient documentation

## 2017-08-23 DIAGNOSIS — E1169 Type 2 diabetes mellitus with other specified complication: Secondary | ICD-10-CM

## 2017-08-23 DIAGNOSIS — Z951 Presence of aortocoronary bypass graft: Secondary | ICD-10-CM | POA: Insufficient documentation

## 2017-08-23 DIAGNOSIS — N183 Chronic kidney disease, stage 3 unspecified: Secondary | ICD-10-CM

## 2017-08-23 DIAGNOSIS — Z8673 Personal history of transient ischemic attack (TIA), and cerebral infarction without residual deficits: Secondary | ICD-10-CM | POA: Insufficient documentation

## 2017-08-23 DIAGNOSIS — F319 Bipolar disorder, unspecified: Secondary | ICD-10-CM | POA: Diagnosis not present

## 2017-08-23 DIAGNOSIS — I482 Chronic atrial fibrillation, unspecified: Secondary | ICD-10-CM

## 2017-08-23 DIAGNOSIS — I130 Hypertensive heart and chronic kidney disease with heart failure and stage 1 through stage 4 chronic kidney disease, or unspecified chronic kidney disease: Secondary | ICD-10-CM | POA: Diagnosis not present

## 2017-08-23 DIAGNOSIS — Z8546 Personal history of malignant neoplasm of prostate: Secondary | ICD-10-CM | POA: Insufficient documentation

## 2017-08-23 DIAGNOSIS — I2510 Atherosclerotic heart disease of native coronary artery without angina pectoris: Secondary | ICD-10-CM | POA: Insufficient documentation

## 2017-08-23 DIAGNOSIS — E785 Hyperlipidemia, unspecified: Secondary | ICD-10-CM | POA: Diagnosis not present

## 2017-08-23 DIAGNOSIS — F17200 Nicotine dependence, unspecified, uncomplicated: Secondary | ICD-10-CM | POA: Diagnosis not present

## 2017-08-23 DIAGNOSIS — F1010 Alcohol abuse, uncomplicated: Secondary | ICD-10-CM | POA: Diagnosis not present

## 2017-08-23 DIAGNOSIS — I5022 Chronic systolic (congestive) heart failure: Secondary | ICD-10-CM | POA: Insufficient documentation

## 2017-08-23 DIAGNOSIS — E1122 Type 2 diabetes mellitus with diabetic chronic kidney disease: Secondary | ICD-10-CM | POA: Diagnosis not present

## 2017-08-23 DIAGNOSIS — Z794 Long term (current) use of insulin: Secondary | ICD-10-CM | POA: Diagnosis not present

## 2017-08-23 DIAGNOSIS — Z7901 Long term (current) use of anticoagulants: Secondary | ICD-10-CM | POA: Insufficient documentation

## 2017-08-23 DIAGNOSIS — Z79899 Other long term (current) drug therapy: Secondary | ICD-10-CM | POA: Insufficient documentation

## 2017-08-23 LAB — BASIC METABOLIC PANEL
Anion gap: 10 (ref 5–15)
BUN: 42 mg/dL — AB (ref 6–20)
CALCIUM: 9.2 mg/dL (ref 8.9–10.3)
CHLORIDE: 103 mmol/L (ref 101–111)
CO2: 26 mmol/L (ref 22–32)
CREATININE: 2.49 mg/dL — AB (ref 0.61–1.24)
GFR calc Af Amer: 27 mL/min — ABNORMAL LOW (ref >60)
GFR calc non Af Amer: 23 mL/min — ABNORMAL LOW (ref >60)
Glucose, Bld: 110 mg/dL — ABNORMAL HIGH (ref 65–99)
Potassium: 3.5 mmol/L (ref 3.5–5.1)
SODIUM: 139 mmol/L (ref 135–145)

## 2017-08-23 LAB — LIPID PANEL
CHOLESTEROL: 107 mg/dL (ref 0–200)
HDL: 43 mg/dL (ref >40)
LDL Cholesterol: 32 mg/dL (ref 0–99)
Total CHOL/HDL Ratio: 2.5 RATIO
Triglycerides: 160 mg/dL — ABNORMAL HIGH (ref <150)
VLDL: 32 mg/dL (ref 0–40)

## 2017-08-23 LAB — CBC
HCT: 39.5 % (ref 39.0–52.0)
Hemoglobin: 12.7 g/dL — ABNORMAL LOW (ref 13.0–17.0)
MCH: 29.4 pg (ref 26.0–34.0)
MCHC: 32.2 g/dL (ref 30.0–36.0)
MCV: 91.4 fL (ref 78.0–100.0)
Platelets: 256 10*3/uL (ref 150–400)
RBC: 4.32 MIL/uL (ref 4.22–5.81)
RDW: 16 % — AB (ref 11.5–15.5)
WBC: 5.9 10*3/uL (ref 4.0–10.5)

## 2017-08-23 NOTE — Patient Instructions (Signed)
Labs drawn today   Your physician recommends that you schedule a follow-up appointment in: 3 months

## 2017-08-23 NOTE — Progress Notes (Signed)
PCP: Dr. 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 male of HTN, CKD stage III, DM2, Prior CVA, CAD due to ICM, PAD, chronic afib, and ETOH abuse. He had a LCx stent in 2011 and has a known occluded RCA.  He has been in chronic atrial fibrillation.  Prior history of GI bleeding with ablation of small bowel AVMs in 5/17. Most recent echo in 5/17 with EF 25-30%.  He lives in an independent living facility.  He was admitted in 7/17 with acute on chronic systolic CHF and diuresed.    Myoview 10/27/16 with EF 19% with large severe defect in the basal inferior, basal inferolateral, mid inferior and mid inferolateral location consistent with previous MI.    Pt presents today for regular follow up. Weight is down about 8 lbs.  Still drinking heavily and smokes at least 1/2 ppd.  Feels like he has been stable.  No dyspnea walking in house but not very active.  Uses cane for balance.  No chest pain.  No dizziness or falls though hydralazine/Imdur have been decreased since the last appointment due to low BP.  No orthopnea/PND.  Says he is taking all his meds, paramedicine visits him.  No BRBPR or melena. No claudication. Main complaint is low back pain, this is most limiting to him.   Review of systems complete and found to be negative unless listed in HPI.    Labs (7/17): K 4.2, creatinine 2.62 Labs (08/12/2016) K 4.2, creatinine 2.25.  Labs (9/17): K 4.2, creatinine 1.45, hgb 12.2 Labs (10/17): LDL 44 Labs (11/17): K 3.6, creatinine 1.97 Labs (12/17): hgb 13.3 Labs (2/18): K 3.5, creatinine 2 Labs (4/18): K 3.8, creatinine 2.62, BUN 51, hgb 11.9 Labs (5/18): K 4, creatinine 1.97  PMH: 1. HTN 2. Type II diabetes 3. H/o CVA 4. H/o GI bleeding: Small bowel AVM ablation in 5/17.  5. Prostate cancer 6. Bipolar disorder 7. ETOH abuse 8. PAD 9. CAD: DES to LCx in 2011, RCA noted to be chronically totally occluded.  - Myoview 10/27/16 with EF 19% with large severe defect in the  basal inferior, basal inferolateral, mid inferior and mid inferolateral location consistent with previous MI.   10. Chronic systolic CHF: Suspect ischemic cardiomyopathy but may have contribution from ETOH abuse.   - Echo (8/15): EF 40-45%, mildly dilated RV. - Echo (5/17): EF 25-30%, normal RV size and systolic function.  11. CKD: Stage III.  12. Chronic atrial fibrillation  SH: Lives alone in an independent living facility.  1/2 gallon liquor/week.  Active smoker.    Family History  Problem Relation Age of Onset  . Cancer Father   . Cancer Mother   . Cancer Sister    ROS: All systems reviewed and negative except as per HPI.   Current Outpatient Prescriptions  Medication Sig Dispense Refill  . acetaminophen (TYLENOL) 325 MG tablet Take 650 mg by mouth every 8 (eight) hours as needed for mild pain or fever (pain).     Marland Kitchen albuterol (PROVENTIL HFA;VENTOLIN HFA) 108 (90 BASE) MCG/ACT inhaler Inhale 2 puffs into the lungs every 6 (six) hours as needed for shortness of breath.    Marland Kitchen ammonium lactate (LAC-HYDRIN) 12 % lotion Apply 1 application topically daily.    Marland Kitchen apixaban (ELIQUIS) 5 MG TABS tablet Take 1 tablet (5 mg total) by mouth every 12 (twelve) hours. 60 tablet 3  . atorvastatin (LIPITOR) 40 MG tablet Take 1 tablet (40 mg total) by mouth daily.  90 tablet 3  . dextrose (GLUTOSE) 40 % GEL Take 1 Tube by mouth daily as needed for low blood sugar.     . feeding supplement, GLUCERNA SHAKE, (GLUCERNA SHAKE) LIQD Take 237 mLs by mouth 3 (three) times daily between meals.    . ferrous sulfate 325 (65 FE) MG tablet Take 325 mg by mouth 2 (two) times daily.    . finasteride (PROSCAR) 5 MG tablet Take 5 mg by mouth daily.      . folic acid (FOLVITE) 1 MG tablet Take 1 tablet (1 mg total) by mouth daily. 30 tablet 0  . furosemide (LASIX) 20 MG tablet Take 40 mg (2 Tablets) the AM and 20 mg (1 Tablet) in the PM 90 tablet 3  . gabapentin (NEURONTIN) 300 MG capsule Take 300 mg by mouth 2 (two) times  daily.     Marland Kitchen guaifenesin (HUMIBID E) 400 MG TABS tablet Take 400 mg by mouth 3 (three) times daily as needed. For congestion    . hydrALAZINE (APRESOLINE) 25 MG tablet Take 1 tablet (25 mg total) by mouth 3 (three) times daily. 90 tablet 6  . insulin glargine (LANTUS) 100 UNIT/ML injection Inject 40 Units into the skin daily.    . insulin regular (NOVOLIN R,HUMULIN R) 100 units/mL injection Inject 5 Units into the skin See admin instructions. Inject 5 units under the skin before breakfast and inject 5 units before evening meal    . isosorbide mononitrate (IMDUR) 30 MG 24 hr tablet Take 1 tablet (30 mg total) by mouth daily. 60 tablet 3  . metoprolol succinate (TOPROL-XL) 50 MG 24 hr tablet Take 1 tablet (50 mg total) by mouth daily. 30 tablet 3  . Multiple Vitamins-Minerals (MULTIVITAMIN WITH MINERALS) tablet Take 1 tablet by mouth daily.    . pantoprazole (PROTONIX) 40 MG tablet Take 1 tablet (40 mg total) by mouth daily. 30 tablet 11  . simethicone (MYLICON) 80 MG chewable tablet Chew 80 mg by mouth 3 (three) times daily as needed for flatulence. Take with meals as needed    . spironolactone (ALDACTONE) 25 MG tablet Take 0.5 tablets (12.5 mg total) by mouth daily. 45 tablet 3  . Tamsulosin HCl (FLOMAX) 0.4 MG CAPS Take 0.4 mg by mouth Nightly.     . thiamine 100 MG tablet Take 1 tablet (100 mg total) by mouth daily. 30 tablet 0   No current facility-administered medications for this encounter.    BP (!) 88/54   Pulse 76   Wt 186 lb 4 oz (84.5 kg)   SpO2 97%   BMI 25.26 kg/m    Wt Readings from Last 3 Encounters:  08/23/17 186 lb 4 oz (84.5 kg)  08/17/17 185 lb (83.9 kg)  08/03/17 186 lb 9.6 oz (84.6 kg)    Physical Exam  General: NAD Neck: No JVD, no thyromegaly or thyroid nodule.  Lungs: Mildly distant breath sounds.  CV: Nondisplaced PMI.  Heart irregular S1/S2, no S3/S4, no murmur.  No peripheral edema.  No carotid bruit.  Difficult to palpate pedal pulses.  Abdomen: Soft,  nontender, no hepatosplenomegaly, no distention.  Skin: Intact without lesions or rashes.  Neurologic: Alert and oriented x 3.  Psych: Normal affect. Extremities: No clubbing or cyanosis.  HEENT: Normal.   Assessment/Plan: 1. CAD:  No chest pain. Cardiolite 10/17 with infarct, no ischemia.  - No ASA with stable CAD and Eliquis.  - Continue statin, check lipids today.  2. Chronic systolic CHF: EF 41-32% on 5/17  echo, which is down from 40-45% in 8/15. Suspect ischemic cardiomyopathy with possible contribution from ETOH.  NYHA class II-III.  Weight is down, he does not look volume overloaded on exam. BP soft, no room to titrate meds. - Continue Lasix 40 qam, 20 qpm.  BMET today.   - Continue hydralazine 25 mg tid and increase Imdur to 30 mg daily.   - Continue Toprol XL 50 daily.  - Continue spironolactone 12.5 mg daily.   - Hold off on ACEI/ARB/ARNI for now with elevated creatinine and low BP.  - I will arrange for repeat echo. 3. PAD: No claudication reported. Would avoid cilostazol given CHF. Continue statin.  4. CKD: Stage III.  BMET today.  5. ETOH abuse: Patient continues to drink heavily.   - Pt not interested in cessation or cutting back. This is likely contributed to his CMP.  6. Smoking: Still smoking but thinking about quitting.   7. Atrial fibrillation:  Chronic. No recent overt GI bleeding.  - Continue Eliquis 5 mg BID.  Check CBC.   Followup 3 months.   Loralie Champagne, MD  08/23/2017

## 2017-08-23 NOTE — Progress Notes (Signed)
Paramedicine Encounter   Patient ID: [name] , male,   DOB: [birth year],80 y.o.,  MRN: [id number]   Met patient in clinic today with provider.  Time spent with patient 45 min  Pt reports he is doing well, his b/p was on the low side today, no changes with meds today.   Will get ECHO done in a few wks and f/u in 22mhs.  He did take his meds this morn.  Weight 186 at office today. Will go back out tomor to fill pill box.   KMarylouise Stacks EMT-Paramedic 08/23/2017   ACTION: Home visit completed

## 2017-08-24 ENCOUNTER — Other Ambulatory Visit (HOSPITAL_COMMUNITY): Payer: Self-pay

## 2017-08-24 NOTE — Progress Notes (Signed)
Paramedicine Encounter    Patient ID: [name], male    DOB: 08-01-1936, 81 y.o.   MRN: [id number]   Patient Care Team: Beacher May, MD as PCP - General (Internal Medicine)  Patient Active Problem List   Diagnosis Date Noted  . Chronic systolic CHF (congestive heart failure) ([place]) 10/29/2016  . Bipolar disorder ([place])   . Alcohol abuse 07/09/2016  . Demand ischemia ([place]) 05/11/2016  . Chronic anticoagulation-Eliquis 05/11/2016  . Elevated troponin 05/11/2016  . Symptomatic anemia 05/11/2016  . Anemia 11/28/2015  . CVA (cerebral infarction)-Augb 2015 08/25/2014  . Stroke ([place]) 08/25/2014  . CKD (chronic kidney disease), stage III 07/17/2014  . Tobacco abuse 06/27/2014  . Pulmonary edema   . Chronic atrial fibrillation ([place])   . Edema 08/20/2011  . DM (diabetes mellitus), type 2 with renal complications ([place]) 60/73/7106  . Hyperlipidemia associated with type 2 diabetes mellitus ([place]) 03/10/2010  . Essential hypertension 03/10/2010  . MYOCARDIAL INFARCTION 03/10/2010  . CAD- last PCI 2011 03/10/2010  . PVD 03/10/2010  . GERD 03/10/2010    Current Outpatient Prescriptions:  .  acetaminophen (TYLENOL) 325 MG tablet, Take 650 mg by mouth every 8 (eight) hours as needed for mild pain or fever (pain). , Disp: , Rfl:  .  albuterol (PROVENTIL HFA;VENTOLIN HFA) 108 (90 BASE) MCG/ACT inhaler, Inhale 2 puffs into the lungs every 6 (six) hours as needed for shortness of breath., Disp: , Rfl:  .  ammonium lactate (LAC-HYDRIN) 12 % lotion, Apply 1 application topically daily., Disp: , Rfl:  .  apixaban (ELIQUIS) 5 MG TABS tablet, Take 1 tablet (5 mg total) by mouth every 12 (twelve) hours., Disp: 60 tablet, Rfl: 3 .  atorvastatin (LIPITOR) 40 MG tablet, Take 1 tablet (40 mg total) by mouth daily., Disp: 90 tablet, Rfl: 3 .  dextrose (GLUTOSE) 40 % GEL, Take 1 Tube by mouth daily as needed for low blood sugar. , Disp: , Rfl:  .  feeding supplement, GLUCERNA SHAKE, (GLUCERNA SHAKE)  LIQD, Take 237 mLs by mouth 3 (three) times daily between meals., Disp: , Rfl:  .  ferrous sulfate 325 (65 FE) MG tablet, Take 325 mg by mouth 2 (two) times daily., Disp: , Rfl:  .  finasteride (PROSCAR) 5 MG tablet, Take 5 mg by mouth daily.  , Disp: , Rfl:  .  folic acid (FOLVITE) 1 MG tablet, Take 1 tablet (1 mg total) by mouth daily., Disp: 30 tablet, Rfl: 0 .  furosemide (LASIX) 20 MG tablet, Take 40 mg (2 Tablets) the AM and 20 mg (1 Tablet) in the PM, Disp: 90 tablet, Rfl: 3 .  gabapentin (NEURONTIN) 300 MG capsule, Take 300 mg by mouth 2 (two) times daily. , Disp: , Rfl:  .  hydrALAZINE (APRESOLINE) 25 MG tablet, Take 1 tablet (25 mg total) by mouth 3 (three) times daily., Disp: 90 tablet, Rfl: 6 .  insulin glargine (LANTUS) 100 UNIT/ML injection, Inject 40 Units into the skin daily., Disp: , Rfl:  .  insulin regular (NOVOLIN R,HUMULIN R) 100 units/mL injection, Inject 5 Units into the skin See admin instructions. Inject 5 units under the skin before breakfast and inject 5 units before evening meal, Disp: , Rfl:  .  isosorbide mononitrate (IMDUR) 30 MG 24 hr tablet, Take 1 tablet (30 mg total) by mouth daily., Disp: 60 tablet, Rfl: 3 .  metoprolol succinate (TOPROL-XL) 50 MG 24 hr tablet, Take 1 tablet (50 mg total) by mouth daily., Disp: 30 tablet, Rfl:  3 .  Multiple Vitamins-Minerals (MULTIVITAMIN WITH MINERALS) tablet, Take 1 tablet by mouth daily., Disp: , Rfl:  .  pantoprazole (PROTONIX) 40 MG tablet, Take 1 tablet (40 mg total) by mouth daily., Disp: 30 tablet, Rfl: 11 .  spironolactone (ALDACTONE) 25 MG tablet, Take 0.5 tablets (12.5 mg total) by mouth daily., Disp: 45 tablet, Rfl: 3 .  Tamsulosin HCl (FLOMAX) 0.4 MG CAPS, Take 0.4 mg by mouth Nightly. , Disp: , Rfl:  .  thiamine 100 MG tablet, Take 1 tablet (100 mg total) by mouth daily., Disp: 30 tablet, Rfl: 0 .  guaifenesin (HUMIBID E) 400 MG TABS tablet, Take 400 mg by mouth 3 (three) times daily as needed. For congestion, Disp: ,  Rfl:  .  simethicone (MYLICON) 80 MG chewable tablet, Chew 80 mg by mouth 3 (three) times daily as needed for flatulence. Take with meals as needed, Disp: , Rfl:  Allergies  Allergen Reactions  . Penicillins Swelling    Has patient had a PCN reaction causing immediate rash, facial/tongue/throat swelling, SOB or lightheadedness with hypotension: Yes Has patient had a PCN reaction causing severe rash involving mucus membranes or skin necrosis: No Has patient had a PCN reaction that required hospitalization: No Has patient had a PCN reaction occurring within the last 10 years: No If all of the above answers are "NO", then may proceed with Cephalosporin use.     Social History   Social History  . Marital status: Divorced    Spouse name: N/A  . Number of children: N/A  . Years of education: N/A   Occupational History  . retired Architect    Social History Main Topics  . Smoking status: Current Every Day Smoker    Packs/day: 0.50    Years: 50.00    Types: Cigarettes  . Smokeless tobacco: Never Used     Comment: He has a 50-pack-year hx of tobacco abuse. Currently, smoking about half a pack a day.  . Alcohol use Yes     Comment: Previously drank heavily, and now has a drink every other day or so.  . Drug use: Yes    Types: Marijuana, Cocaine  . Sexual activity: No   Other Topics Concern  . Not on file   Social History Narrative  . No narrative on file    Physical Exam      Future Appointments Date Time Provider [place]  09/22/2017 1:00 PM [place] ECHO 1-BUZZ [facility]  11/25/2017 12:00 PM Larey Dresser, MD [facility] None   BP 128/68   Pulse 64   Resp 15   Wt 185 lb (83.9 kg)   BMI 25.09 kg/m  Weight yesterday-185 Last visit weight-185 CBG PTA-165  Pt reports that yesterday when he got home from clinic he felt weak and had to get walker to assist him getting inside, yesterday his b/p was low in clinic, but today he does not feel weak anymore, no  dizziness, no swelling, no h/a. He missed his noon dose of meds yesterday due to doc appointment.  meds verified and pill box refilled. Pt wants me to see about getting him a walker. The rolling walker he has is difficult to take around with him. Contacted the [facility] and got 2 of his refills sent in, asked about the home based program again and also about his walker and the nurse I spoke with will be taking care of that and put in those referrals to doc.     ACTION: Home  visit completed  Marylouise Stacks, EMT-Paramedic 08/24/17

## 2017-08-27 ENCOUNTER — Telehealth (HOSPITAL_COMMUNITY): Payer: Self-pay | Admitting: *Deleted

## 2017-08-27 DIAGNOSIS — I5022 Chronic systolic (congestive) heart failure: Secondary | ICD-10-CM

## 2017-08-27 NOTE — Telephone Encounter (Signed)
-----   Message from Larey Dresser, MD sent at 08/25/2017 11:05 AM EDT ----- Creatinine higher but fluctuates in this range.  Repeat in 1 week to make sure it does not go higher.

## 2017-08-31 ENCOUNTER — Other Ambulatory Visit (HOSPITAL_COMMUNITY): Payer: Self-pay

## 2017-08-31 NOTE — Progress Notes (Signed)
Paramedicine Encounter    Patient ID: [name], male    DOB: [birth year], 81 y.o.   MRN: [id number]   Patient Care Team: Beacher May, MD as PCP - General (Internal Medicine)  Patient Active Problem List   Diagnosis Date Noted  . Chronic systolic CHF (congestive heart failure) ([place]) 10/29/2016  . Bipolar disorder ([place])   . Alcohol abuse 07/09/2016  . Demand ischemia ([place]) 05/11/2016  . Chronic anticoagulation-Eliquis 05/11/2016  . Elevated troponin 05/11/2016  . Symptomatic anemia 05/11/2016  . Anemia 11/28/2015  . CVA (cerebral infarction)-Augb 2015 08/25/2014  . Stroke ([place]) 08/25/2014  . CKD (chronic kidney disease), stage III 07/17/2014  . Tobacco abuse 06/27/2014  . Pulmonary edema   . Chronic atrial fibrillation ([place])   . Edema 08/20/2011  . DM (diabetes mellitus), type 2 with renal complications ([place]) 76/19/5093  . Hyperlipidemia associated with type 2 diabetes mellitus ([place]) 03/10/2010  . Essential hypertension 03/10/2010  . MYOCARDIAL INFARCTION 03/10/2010  . CAD- last PCI 2011 03/10/2010  . PVD 03/10/2010  . GERD 03/10/2010    Current Outpatient Prescriptions:  .  acetaminophen (TYLENOL) 325 MG tablet, Take 650 mg by mouth every 8 (eight) hours as needed for mild pain or fever (pain). , Disp: , Rfl:  .  albuterol (PROVENTIL HFA;VENTOLIN HFA) 108 (90 BASE) MCG/ACT inhaler, Inhale 2 puffs into the lungs every 6 (six) hours as needed for shortness of breath., Disp: , Rfl:  .  ammonium lactate (LAC-HYDRIN) 12 % lotion, Apply 1 application topically daily., Disp: , Rfl:  .  apixaban (ELIQUIS) 5 MG TABS tablet, Take 1 tablet (5 mg total) by mouth every 12 (twelve) hours., Disp: 60 tablet, Rfl: 3 .  atorvastatin (LIPITOR) 40 MG tablet, Take 1 tablet (40 mg total) by mouth daily., Disp: 90 tablet, Rfl: 3 .  dextrose (GLUTOSE) 40 % GEL, Take 1 Tube by mouth daily as needed for low blood sugar. , Disp: , Rfl:  .  feeding supplement, GLUCERNA SHAKE, (GLUCERNA SHAKE)  LIQD, Take 237 mLs by mouth 3 (three) times daily between meals., Disp: , Rfl:  .  ferrous sulfate 325 (65 FE) MG tablet, Take 325 mg by mouth 2 (two) times daily., Disp: , Rfl:  .  finasteride (PROSCAR) 5 MG tablet, Take 5 mg by mouth daily.  , Disp: , Rfl:  .  folic acid (FOLVITE) 1 MG tablet, Take 1 tablet (1 mg total) by mouth daily., Disp: 30 tablet, Rfl: 0 .  furosemide (LASIX) 20 MG tablet, Take 40 mg (2 Tablets) the AM and 20 mg (1 Tablet) in the PM, Disp: 90 tablet, Rfl: 3 .  gabapentin (NEURONTIN) 300 MG capsule, Take 300 mg by mouth 2 (two) times daily. , Disp: , Rfl:  .  hydrALAZINE (APRESOLINE) 25 MG tablet, Take 1 tablet (25 mg total) by mouth 3 (three) times daily., Disp: 90 tablet, Rfl: 6 .  insulin glargine (LANTUS) 100 UNIT/ML injection, Inject 40 Units into the skin daily., Disp: , Rfl:  .  insulin regular (NOVOLIN R,HUMULIN R) 100 units/mL injection, Inject 5 Units into the skin See admin instructions. Inject 5 units under the skin before breakfast and inject 5 units before evening meal, Disp: , Rfl:  .  isosorbide mononitrate (IMDUR) 30 MG 24 hr tablet, Take 1 tablet (30 mg total) by mouth daily., Disp: 60 tablet, Rfl: 3 .  metoprolol succinate (TOPROL-XL) 50 MG 24 hr tablet, Take 1 tablet (50 mg total) by mouth daily., Disp: 30 tablet, Rfl:  3 .  Multiple Vitamins-Minerals (MULTIVITAMIN WITH MINERALS) tablet, Take 1 tablet by mouth daily., Disp: , Rfl:  .  pantoprazole (PROTONIX) 40 MG tablet, Take 1 tablet (40 mg total) by mouth daily., Disp: 30 tablet, Rfl: 11 .  spironolactone (ALDACTONE) 25 MG tablet, Take 0.5 tablets (12.5 mg total) by mouth daily., Disp: 45 tablet, Rfl: 3 .  Tamsulosin HCl (FLOMAX) 0.4 MG CAPS, Take 0.4 mg by mouth Nightly. , Disp: , Rfl:  .  thiamine 100 MG tablet, Take 1 tablet (100 mg total) by mouth daily., Disp: 30 tablet, Rfl: 0 .  guaifenesin (HUMIBID E) 400 MG TABS tablet, Take 400 mg by mouth 3 (three) times daily as needed. For congestion, Disp: ,  Rfl:  .  simethicone (MYLICON) 80 MG chewable tablet, Chew 80 mg by mouth 3 (three) times daily as needed for flatulence. Take with meals as needed, Disp: , Rfl:  Allergies  Allergen Reactions  . Penicillins Swelling    Has patient had a PCN reaction causing immediate rash, facial/tongue/throat swelling, SOB or lightheadedness with hypotension: Yes Has patient had a PCN reaction causing severe rash involving mucus membranes or skin necrosis: No Has patient had a PCN reaction that required hospitalization: No Has patient had a PCN reaction occurring within the last 10 years: No If all of the above answers are "NO", then may proceed with Cephalosporin use.     Social History   Social History  . Marital status: Divorced    Spouse name: N/A  . Number of children: N/A  . Years of education: N/A   Occupational History  . retired Architect    Social History Main Topics  . Smoking status: Current Every Day Smoker    Packs/day: 0.50    Years: 50.00    Types: Cigarettes  . Smokeless tobacco: Never Used     Comment: He has a 50-pack-year hx of tobacco abuse. Currently, smoking about half a pack a day.  . Alcohol use Yes     Comment: Previously drank heavily, and now has a drink every other day or so.  . Drug use: Yes    Types: Marijuana, Cocaine  . Sexual activity: No   Other Topics Concern  . Not on file   Social History Narrative  . No narrative on file    Physical Exam      Future Appointments Date Time Provider [place]  09/03/2017 12:00 PM [facility] None  09/22/2017 1:00 PM [place] ECHO 1-BUZZ [facility]  11/25/2017 12:00 PM Larey Dresser, MD [facility] None   BP 106/68   Pulse 78   Resp 15   Wt 186 lb (84.4 kg)   SpO2 97%   BMI 25.23 kg/m  Weight yesterday-??? Last visit weight-185 CBG EMS-57 CBG EMS-63 CBG EMS-84  Pt states his scales wasn't working, but I got on them and it was working fine, then he got on them and it was fine also.  That's why he didn't weigh yesterday.  Pt states his breathing is good, tired more than usual but he doesn't sleep well at night. No swelling noted. He states sometimes when he moves around a lot he does get sob-just sometimes. He missed sun pm does of his meds.  meds verified and pill box refilled.  Needs folic acid--called it in last week. Will see if it gets here by next week and if not will have to call again. His CBG as noted, so I got him some cake  for a quick fix and when his aide returned back from doing his laundry he fixed him lunch.  He denies feeling any different-he did not eat anything for breakfast-he actually got up not long before I arrived.  Vest applied and it was 40%. His diet includes high sodium foods and a lot of fluid intake daily. Ive tried discussing the limitations needed and he always replies with he wants to eat to enjoy the foods.   ACTION: Home visit completed Next visit planned for next week   Marylouise Stacks, EMT-Paramedic 08/31/17

## 2017-09-03 ENCOUNTER — Ambulatory Visit (HOSPITAL_COMMUNITY)
Admission: RE | Admit: 2017-09-03 | Discharge: 2017-09-03 | Disposition: A | Discharge status: Home / Self Care | Payer: Medicare Other | Source: Ambulatory Visit | Attending: Internal Medicine | Admitting: Internal Medicine

## 2017-09-03 DIAGNOSIS — I5022 Chronic systolic (congestive) heart failure: Secondary | ICD-10-CM | POA: Insufficient documentation

## 2017-09-03 LAB — BASIC METABOLIC PANEL
Anion gap: 8 (ref 5–15)
BUN: 37 mg/dL — AB (ref 6–20)
CHLORIDE: 103 mmol/L (ref 101–111)
CO2: 26 mmol/L (ref 22–32)
Calcium: 9 mg/dL (ref 8.9–10.3)
Creatinine, Ser: 2.15 mg/dL — ABNORMAL HIGH (ref 0.61–1.24)
GFR calc Af Amer: 32 mL/min — ABNORMAL LOW (ref >60)
GFR calc non Af Amer: 28 mL/min — ABNORMAL LOW (ref >60)
Glucose, Bld: 212 mg/dL — ABNORMAL HIGH (ref 65–99)
POTASSIUM: 4.1 mmol/L (ref 3.5–5.1)
SODIUM: 137 mmol/L (ref 135–145)

## 2017-09-07 ENCOUNTER — Other Ambulatory Visit (HOSPITAL_COMMUNITY): Payer: Self-pay

## 2017-09-07 NOTE — Progress Notes (Signed)
Paramedicine Encounter    Patient ID: [name], male    DOB: 1936/04/27, 81 y.o.   MRN: [id number]   Patient Care Team: Beacher May, MD as PCP - General (Internal Medicine)  Patient Active Problem List   Diagnosis Date Noted  . Chronic systolic CHF (congestive heart failure) ([place]) 10/29/2016  . Bipolar disorder ([place])   . Alcohol abuse 07/09/2016  . Demand ischemia ([place]) 05/11/2016  . Chronic anticoagulation-Eliquis 05/11/2016  . Elevated troponin 05/11/2016  . Symptomatic anemia 05/11/2016  . Anemia 11/28/2015  . CVA (cerebral infarction)-Augb 2015 08/25/2014  . Stroke ([place]) 08/25/2014  . CKD (chronic kidney disease), stage III 07/17/2014  . Tobacco abuse 06/27/2014  . Pulmonary edema   . Chronic atrial fibrillation ([place])   . Edema 08/20/2011  . DM (diabetes mellitus), type 2 with renal complications ([place]) 40/98/1191  . Hyperlipidemia associated with type 2 diabetes mellitus ([place]) 03/10/2010  . Essential hypertension 03/10/2010  . MYOCARDIAL INFARCTION 03/10/2010  . CAD- last PCI 2011 03/10/2010  . PVD 03/10/2010  . GERD 03/10/2010    Current Outpatient Prescriptions:  .  acetaminophen (TYLENOL) 325 MG tablet, Take 650 mg by mouth every 8 (eight) hours as needed for mild pain or fever (pain). , Disp: , Rfl:  .  ammonium lactate (LAC-HYDRIN) 12 % lotion, Apply 1 application topically daily., Disp: , Rfl:  .  apixaban (ELIQUIS) 5 MG TABS tablet, Take 1 tablet (5 mg total) by mouth every 12 (twelve) hours., Disp: 60 tablet, Rfl: 3 .  atorvastatin (LIPITOR) 40 MG tablet, Take 1 tablet (40 mg total) by mouth daily. (Patient taking differently: Take 40 mg by mouth at bedtime. ), Disp: 90 tablet, Rfl: 3 .  dextrose (GLUTOSE) 40 % GEL, Take 1 Tube by mouth daily as needed for low blood sugar. , Disp: , Rfl:  .  feeding supplement, GLUCERNA SHAKE, (GLUCERNA SHAKE) LIQD, Take 237 mLs by mouth 3 (three) times daily between meals., Disp: , Rfl:  .  ferrous sulfate 325 (65 FE)  MG tablet, Take 325 mg by mouth 2 (two) times daily., Disp: , Rfl:  .  finasteride (PROSCAR) 5 MG tablet, Take 5 mg by mouth daily.  , Disp: , Rfl:  .  folic acid (FOLVITE) 1 MG tablet, Take 1 tablet (1 mg total) by mouth daily., Disp: 30 tablet, Rfl: 0 .  furosemide (LASIX) 20 MG tablet, Take 40 mg (2 Tablets) the AM and 20 mg (1 Tablet) in the PM, Disp: 90 tablet, Rfl: 3 .  gabapentin (NEURONTIN) 300 MG capsule, Take 300 mg by mouth 2 (two) times daily. , Disp: , Rfl:  .  hydrALAZINE (APRESOLINE) 25 MG tablet, Take 1 tablet (25 mg total) by mouth 3 (three) times daily., Disp: 90 tablet, Rfl: 6 .  insulin glargine (LANTUS) 100 UNIT/ML injection, Inject 40 Units into the skin daily., Disp: , Rfl:  .  insulin regular (NOVOLIN R,HUMULIN R) 100 units/mL injection, Inject 5 Units into the skin See admin instructions. Inject 5 units under the skin before breakfast and inject 5 units before evening meal, Disp: , Rfl:  .  isosorbide mononitrate (IMDUR) 30 MG 24 hr tablet, Take 1 tablet (30 mg total) by mouth daily., Disp: 60 tablet, Rfl: 3 .  metoprolol succinate (TOPROL-XL) 50 MG 24 hr tablet, Take 1 tablet (50 mg total) by mouth daily., Disp: 30 tablet, Rfl: 3 .  Multiple Vitamins-Minerals (MULTIVITAMIN WITH MINERALS) tablet, Take 1 tablet by mouth daily., Disp: , Rfl:  .  pantoprazole (PROTONIX) 40 MG tablet, Take 1 tablet (40 mg total) by mouth daily., Disp: 30 tablet, Rfl: 11 .  spironolactone (ALDACTONE) 25 MG tablet, Take 0.5 tablets (12.5 mg total) by mouth daily., Disp: 45 tablet, Rfl: 3 .  Tamsulosin HCl (FLOMAX) 0.4 MG CAPS, Take 0.4 mg by mouth Nightly. , Disp: , Rfl:  .  thiamine 100 MG tablet, Take 1 tablet (100 mg total) by mouth daily., Disp: 30 tablet, Rfl: 0 .  albuterol (PROVENTIL HFA;VENTOLIN HFA) 108 (90 BASE) MCG/ACT inhaler, Inhale 2 puffs into the lungs every 6 (six) hours as needed for shortness of breath., Disp: , Rfl:  .  guaifenesin (HUMIBID E) 400 MG TABS tablet, Take 400 mg by  mouth 3 (three) times daily as needed. For congestion, Disp: , Rfl:  .  simethicone (MYLICON) 80 MG chewable tablet, Chew 80 mg by mouth 3 (three) times daily as needed for flatulence. Take with meals as needed, Disp: , Rfl:  Allergies  Allergen Reactions  . Penicillins Swelling    Has patient had a PCN reaction causing immediate rash, facial/tongue/throat swelling, SOB or lightheadedness with hypotension: Yes Has patient had a PCN reaction causing severe rash involving mucus membranes or skin necrosis: No Has patient had a PCN reaction that required hospitalization: No Has patient had a PCN reaction occurring within the last 10 years: No If all of the above answers are "NO", then may proceed with Cephalosporin use.     Social History   Social History  . Marital status: Divorced    Spouse name: N/A  . Number of children: N/A  . Years of education: N/A   Occupational History  . retired Architect    Social History Main Topics  . Smoking status: Current Every Day Smoker    Packs/day: 0.50    Years: 50.00    Types: Cigarettes  . Smokeless tobacco: Never Used     Comment: He has a 50-pack-year hx of tobacco abuse. Currently, smoking about half a pack a day.  . Alcohol use Yes     Comment: Previously drank heavily, and now has a drink every other day or so.  . Drug use: Yes    Types: Marijuana, Cocaine  . Sexual activity: No   Other Topics Concern  . Not on file   Social History Narrative  . No narrative on file    Physical Exam      Future Appointments Date Time Provider [place]  09/22/2017 1:00 PM [facility] ECHO 1-BUZZ [facility]  11/25/2017 12:00 PM Larey Dresser, MD [facility] None   BP (!) 154/90   Pulse 92   Resp 16   Wt 187 lb (84.8 kg)   SpO2 97%   BMI 25.36 kg/m   B/P recheck-128/80 Weight yesterday-??? Last visit weight-186 CBG PTA-197  Pt reports he is feeling not too good-mentally-he got word that a close family member was placed in a  nursing home and he was visibly upset after he got off the phone and his b/p was taken shortly after that and it was elevated.  His PCP called and left message that he would be able to get the walker but he needs to see a PT--so he isnt sure if he has to go there or if they are able to come home. I advised him to call them back and see if they can come here in the home and if not he wants me to ask the heart clinic if they can  get it done for the home PT. He states he is having trouble walking-  He denies any sob- No dizziness.   He missed sundays dose of pm meds. meds verified and pill box refilled.    ACTION: Home visit completed  Marylouise Stacks, EMT-Paramedic 09/07/17

## 2017-09-14 ENCOUNTER — Other Ambulatory Visit (HOSPITAL_COMMUNITY): Payer: Self-pay

## 2017-09-14 NOTE — Progress Notes (Signed)
Paramedicine Encounter    Patient ID: [name], male    DOB: 23-Jan-1936, 81 y.o.   MRN: [id number]   Patient Care Team: Beacher May, MD as PCP - General (Internal Medicine)  Patient Active Problem List   Diagnosis Date Noted  . Chronic systolic CHF (congestive heart failure) ([place]) 10/29/2016  . Bipolar disorder ([place])   . Alcohol abuse 07/09/2016  . Demand ischemia ([place]) 05/11/2016  . Chronic anticoagulation-Eliquis 05/11/2016  . Elevated troponin 05/11/2016  . Symptomatic anemia 05/11/2016  . Anemia 11/28/2015  . CVA (cerebral infarction)-Augb 2015 08/25/2014  . Stroke ([place]) 08/25/2014  . CKD (chronic kidney disease), stage III 07/17/2014  . Tobacco abuse 06/27/2014  . Pulmonary edema   . Chronic atrial fibrillation ([place])   . Edema 08/20/2011  . DM (diabetes mellitus), type 2 with renal complications ([place]) 23/55/7322  . Hyperlipidemia associated with type 2 diabetes mellitus ([place]) 03/10/2010  . Essential hypertension 03/10/2010  . MYOCARDIAL INFARCTION 03/10/2010  . CAD- last PCI 2011 03/10/2010  . PVD 03/10/2010  . GERD 03/10/2010    Current Outpatient Prescriptions:  .  acetaminophen (TYLENOL) 325 MG tablet, Take 650 mg by mouth every 8 (eight) hours as needed for mild pain or fever (pain). , Disp: , Rfl:  .  albuterol (PROVENTIL HFA;VENTOLIN HFA) 108 (90 BASE) MCG/ACT inhaler, Inhale 2 puffs into the lungs every 6 (six) hours as needed for shortness of breath., Disp: , Rfl:  .  ammonium lactate (LAC-HYDRIN) 12 % lotion, Apply 1 application topically daily., Disp: , Rfl:  .  apixaban (ELIQUIS) 5 MG TABS tablet, Take 1 tablet (5 mg total) by mouth every 12 (twelve) hours., Disp: 60 tablet, Rfl: 3 .  atorvastatin (LIPITOR) 40 MG tablet, Take 1 tablet (40 mg total) by mouth daily. (Patient taking differently: Take 40 mg by mouth at bedtime. ), Disp: 90 tablet, Rfl: 3 .  dextrose (GLUTOSE) 40 % GEL, Take 1 Tube by mouth daily as needed for low blood sugar. , Disp: ,  Rfl:  .  feeding supplement, GLUCERNA SHAKE, (GLUCERNA SHAKE) LIQD, Take 237 mLs by mouth 3 (three) times daily between meals., Disp: , Rfl:  .  ferrous sulfate 325 (65 FE) MG tablet, Take 325 mg by mouth 2 (two) times daily., Disp: , Rfl:  .  finasteride (PROSCAR) 5 MG tablet, Take 5 mg by mouth daily.  , Disp: , Rfl:  .  folic acid (FOLVITE) 1 MG tablet, Take 1 tablet (1 mg total) by mouth daily., Disp: 30 tablet, Rfl: 0 .  furosemide (LASIX) 20 MG tablet, Take 40 mg (2 Tablets) the AM and 20 mg (1 Tablet) in the PM, Disp: 90 tablet, Rfl: 3 .  gabapentin (NEURONTIN) 300 MG capsule, Take 300 mg by mouth 2 (two) times daily. , Disp: , Rfl:  .  guaifenesin (HUMIBID E) 400 MG TABS tablet, Take 400 mg by mouth 3 (three) times daily as needed. For congestion, Disp: , Rfl:  .  hydrALAZINE (APRESOLINE) 25 MG tablet, Take 1 tablet (25 mg total) by mouth 3 (three) times daily., Disp: 90 tablet, Rfl: 6 .  insulin glargine (LANTUS) 100 UNIT/ML injection, Inject 40 Units into the skin daily., Disp: , Rfl:  .  insulin regular (NOVOLIN R,HUMULIN R) 100 units/mL injection, Inject 5 Units into the skin See admin instructions. Inject 5 units under the skin before breakfast and inject 5 units before evening meal, Disp: , Rfl:  .  isosorbide mononitrate (IMDUR) 30 MG 24 hr tablet,  Take 1 tablet (30 mg total) by mouth daily., Disp: 60 tablet, Rfl: 3 .  metoprolol succinate (TOPROL-XL) 50 MG 24 hr tablet, Take 1 tablet (50 mg total) by mouth daily., Disp: 30 tablet, Rfl: 3 .  Multiple Vitamins-Minerals (MULTIVITAMIN WITH MINERALS) tablet, Take 1 tablet by mouth daily., Disp: , Rfl:  .  pantoprazole (PROTONIX) 40 MG tablet, Take 1 tablet (40 mg total) by mouth daily., Disp: 30 tablet, Rfl: 11 .  spironolactone (ALDACTONE) 25 MG tablet, Take 0.5 tablets (12.5 mg total) by mouth daily., Disp: 45 tablet, Rfl: 3 .  Tamsulosin HCl (FLOMAX) 0.4 MG CAPS, Take 0.4 mg by mouth Nightly. , Disp: , Rfl:  .  thiamine 100 MG tablet,  Take 1 tablet (100 mg total) by mouth daily., Disp: 30 tablet, Rfl: 0 .  simethicone (MYLICON) 80 MG chewable tablet, Chew 80 mg by mouth 3 (three) times daily as needed for flatulence. Take with meals as needed, Disp: , Rfl:  Allergies  Allergen Reactions  . Penicillins Swelling    Has patient had a PCN reaction causing immediate rash, facial/tongue/throat swelling, SOB or lightheadedness with hypotension: Yes Has patient had a PCN reaction causing severe rash involving mucus membranes or skin necrosis: No Has patient had a PCN reaction that required hospitalization: No Has patient had a PCN reaction occurring within the last 10 years: No If all of the above answers are "NO", then may proceed with Cephalosporin use.     Social History   Social History  . Marital status: Divorced    Spouse name: N/A  . Number of children: N/A  . Years of education: N/A   Occupational History  . retired Architect    Social History Main Topics  . Smoking status: Current Every Day Smoker    Packs/day: 0.50    Years: 50.00    Types: Cigarettes  . Smokeless tobacco: Never Used     Comment: He has a 50-pack-year hx of tobacco abuse. Currently, smoking about half a pack a day.  . Alcohol use Yes     Comment: Previously drank heavily, and now has a drink every other day or so.  . Drug use: Yes    Types: Marijuana, Cocaine  . Sexual activity: No   Other Topics Concern  . Not on file   Social History Narrative  . No narrative on file    Physical Exam      Future Appointments Date Time Provider [place]  09/22/2017 1:00 PM [facility] ECHO 1-BUZZ [facility]  11/25/2017 12:00 PM Larey Dresser, MD [facility] None   BP 130/64   Pulse 76   Resp 15   SpO2 96%  Weight yesterday-??? Last visit weight-187  Pt reports he hasnt been doing too well, he has had body soreness all over and was unable to get out of bed yesterday due to that. He states he has been taking aleve for several days  for that which has helped and he is aware he should avoid taking that. He denies any sob unless he exerts himself which he doesn't do. He is very sedentary. He has home aide that does everything for him. He has a rhonchus sound in his lung and has productive cough, he started taking mucinex today along with the use of his inhaler. He said he had diarrhea yesterday but today its gone. Need to get his iron filled. We ordered some meds a few wks ago however nothing in mail yet-his aide will check  mail again this week. He missed 4 doses of his noon time meds. meds verified and pill box refilled. Pt states that he is planning on quitting smoking after he smokes his 5-6 packs that he has left. He said that would last him for about 5-6 days.    ACTION: Home visit completed  Marylouise Stacks, EMT-Paramedic 09/14/17

## 2017-09-17 ENCOUNTER — Emergency Department (HOSPITAL_COMMUNITY): Payer: Medicare Other

## 2017-09-17 ENCOUNTER — Emergency Department (HOSPITAL_COMMUNITY)
Admission: EM | Admit: 2017-09-17 | Discharge: 2017-09-17 | Disposition: A | Discharge status: Home / Self Care | Payer: Medicare Other | Attending: Emergency Medicine | Admitting: Emergency Medicine

## 2017-09-17 DIAGNOSIS — E1122 Type 2 diabetes mellitus with diabetic chronic kidney disease: Secondary | ICD-10-CM | POA: Insufficient documentation

## 2017-09-17 DIAGNOSIS — Z79899 Other long term (current) drug therapy: Secondary | ICD-10-CM | POA: Diagnosis not present

## 2017-09-17 DIAGNOSIS — F17210 Nicotine dependence, cigarettes, uncomplicated: Secondary | ICD-10-CM | POA: Insufficient documentation

## 2017-09-17 DIAGNOSIS — I2510 Atherosclerotic heart disease of native coronary artery without angina pectoris: Secondary | ICD-10-CM | POA: Diagnosis not present

## 2017-09-17 DIAGNOSIS — Z8546 Personal history of malignant neoplasm of prostate: Secondary | ICD-10-CM | POA: Diagnosis not present

## 2017-09-17 DIAGNOSIS — M545 Low back pain, unspecified: Secondary | ICD-10-CM

## 2017-09-17 DIAGNOSIS — I130 Hypertensive heart and chronic kidney disease with heart failure and stage 1 through stage 4 chronic kidney disease, or unspecified chronic kidney disease: Secondary | ICD-10-CM | POA: Insufficient documentation

## 2017-09-17 DIAGNOSIS — Z794 Long term (current) use of insulin: Secondary | ICD-10-CM | POA: Insufficient documentation

## 2017-09-17 DIAGNOSIS — Z7901 Long term (current) use of anticoagulants: Secondary | ICD-10-CM | POA: Diagnosis not present

## 2017-09-17 DIAGNOSIS — I5022 Chronic systolic (congestive) heart failure: Secondary | ICD-10-CM | POA: Insufficient documentation

## 2017-09-17 DIAGNOSIS — N183 Chronic kidney disease, stage 3 (moderate): Secondary | ICD-10-CM | POA: Insufficient documentation

## 2017-09-17 LAB — URINALYSIS, ROUTINE W REFLEX MICROSCOPIC
Bilirubin Urine: NEGATIVE
Glucose, UA: NEGATIVE mg/dL
Hgb urine dipstick: NEGATIVE
KETONES UR: NEGATIVE mg/dL
LEUKOCYTES UA: NEGATIVE
NITRITE: NEGATIVE
PH: 5 (ref 5.0–8.0)
PROTEIN: NEGATIVE mg/dL
Specific Gravity, Urine: 1.011 (ref 1.005–1.030)

## 2017-09-17 LAB — CBC
HCT: 37.4 % — ABNORMAL LOW (ref 39.0–52.0)
Hemoglobin: 12.3 g/dL — ABNORMAL LOW (ref 13.0–17.0)
MCH: 29.8 pg (ref 26.0–34.0)
MCHC: 32.9 g/dL (ref 30.0–36.0)
MCV: 90.6 fL (ref 78.0–100.0)
PLATELETS: 265 10*3/uL (ref 150–400)
RBC: 4.13 MIL/uL — ABNORMAL LOW (ref 4.22–5.81)
RDW: 15.6 % — ABNORMAL HIGH (ref 11.5–15.5)
WBC: 6.9 10*3/uL (ref 4.0–10.5)

## 2017-09-17 LAB — BASIC METABOLIC PANEL
ANION GAP: 10 (ref 5–15)
BUN: 53 mg/dL — ABNORMAL HIGH (ref 6–20)
CALCIUM: 9.3 mg/dL (ref 8.9–10.3)
CO2: 27 mmol/L (ref 22–32)
Chloride: 103 mmol/L (ref 101–111)
Creatinine, Ser: 2.13 mg/dL — ABNORMAL HIGH (ref 0.61–1.24)
GFR, EST AFRICAN AMERICAN: 32 mL/min — AB (ref >60)
GFR, EST NON AFRICAN AMERICAN: 28 mL/min — AB (ref >60)
Glucose, Bld: 252 mg/dL — ABNORMAL HIGH (ref 65–99)
Potassium: 3.9 mmol/L (ref 3.5–5.1)
Sodium: 140 mmol/L (ref 135–145)

## 2017-09-17 MED ORDER — HYDROMORPHONE HCL 1 MG/ML IJ SOLN
0.5000 mg | INTRAMUSCULAR | Status: AC | PRN
  Administered 2017-09-17 (×2): 0.5 mg via INTRAVENOUS
  Filled 2017-09-17 (×2): qty 1

## 2017-09-17 MED ORDER — POLYETHYLENE GLYCOL 3350 17 GM/SCOOP PO POWD: 17.0000 g | Freq: Every day | ORAL | 0 refills | Status: AC

## 2017-09-17 MED ORDER — HYDROCODONE-ACETAMINOPHEN 5-325 MG PO TABS: 1.0000 | ORAL_TABLET | Freq: Four times a day (QID) | ORAL | 0 refills | Status: DC | PRN

## 2017-09-17 NOTE — ED Notes (Signed)
Condom Cath applied

## 2017-09-17 NOTE — ED Triage Notes (Signed)
Patient has been having severe back pain that is chronic and flares up from time to time. Patient tried OTC tylenol. No numbness or tingling. Patient lives alone with a caregiver that checks on him.

## 2017-09-17 NOTE — ED Notes (Signed)
Pt ambulated well with a walker in the hall and to the bathroom---- pt is slow on his ambulation but otherwise, his  gait and balance steady.

## 2017-09-17 NOTE — ED Provider Notes (Signed)
[facility] DEPT Provider Note   CSN: [id number] Arrival date & time: 09/17/17  1333     History   Chief Complaint No chief complaint on file.   HPI [name] is a 81 y.o. male.  HPI Patient presents to the emergency room with complaints of severe lower back pain. Patient states he started having symptoms a few days ago. It over-the-counter medications such as Tylenol without relief. The pain is severe in his lower back and increases with certain movements. He denies any numbness or tingling. He denies any weight loss but has not had much appetite recently. He denies any fevers or chills. No dysuria. Past Medical History:  Diagnosis Date  . Anemia 11/28/2015  . Atrial fibrillation ([place])   . Bipolar disorder ([place])   . CAD (coronary artery disease)    a.  NSTEMI treated with PCI Feb 2011 with a DES.([facility]);   b. cath 2/11: D1 stents x 2 ok, AV groove CFX occluded with dist AV CFX filled by L-L collats, RCA occluded, OM2 90-95% (treated with PCI)  . CKD (chronic kidney disease), stage III   . DM (diabetes mellitus) ([place])   . GERD (gastroesophageal reflux disease)   . GI bleed-recent SB study-AVMs   . Heart failure ([place])   . HTN (hypertension)    echo 2/11: EF 50-55%, diast dyfxn, severe LVH, inf HK, LAE  . Hyperlipidemia   . Hypertensive emergency 06/24/2014  . MI (myocardial infarction) ([place])   . Prostate cancer [facility])    status post radiation therapy in 2003  . Pulmonary edema   . PVD (peripheral vascular disease) ([place])   . Respiratory difficulty 06/24/2014   intubated   . Shortness of breath dyspnea   . Stroke [facility]) 07/2014    Patient Active Problem List   Diagnosis Date Noted  . Chronic systolic CHF (congestive heart failure) ([place]) 10/29/2016  . Bipolar disorder ([place])   . Alcohol abuse 07/09/2016  . Demand ischemia ([place]) 05/11/2016  . Chronic anticoagulation-Eliquis 05/11/2016  . Elevated troponin 05/11/2016  . Symptomatic anemia 05/11/2016  . Anemia  11/28/2015  . CVA (cerebral infarction)-Augb 2015 08/25/2014  . Stroke ([place]) 08/25/2014  . CKD (chronic kidney disease), stage III 07/17/2014  . Tobacco abuse 06/27/2014  . Pulmonary edema   . Chronic atrial fibrillation ([place])   . Edema 08/20/2011  . DM (diabetes mellitus), type 2 with renal complications ([place]) 78/58/8502  . Hyperlipidemia associated with type 2 diabetes mellitus ([place]) 03/10/2010  . Essential hypertension 03/10/2010  . MYOCARDIAL INFARCTION 03/10/2010  . CAD- last PCI 2011 03/10/2010  . PVD 03/10/2010  . GERD 03/10/2010    Past Surgical History:  Procedure Laterality Date  . COLONOSCOPY N/A 01/25/2016   Procedure: COLONOSCOPY;  Surgeon: Ronald Lobo, MD;  Location: [facility] ENDOSCOPY;  Service: Endoscopy;  Laterality: N/A;  . ESOPHAGOGASTRODUODENOSCOPY N/A 11/29/2015   Procedure: ESOPHAGOGASTRODUODENOSCOPY (EGD);  Surgeon: Wonda Horner, MD;  Location: [facility] ENDOSCOPY;  Service: Endoscopy;  Laterality: N/A;  . ESOPHAGOGASTRODUODENOSCOPY N/A 05/13/2016   Procedure: ESOPHAGOGASTRODUODENOSCOPY (EGD);  Surgeon: Clarene Essex, MD;  Location: [facility] ENDOSCOPY;  Service: Endoscopy;  Laterality: N/A;  . ESOPHAGOGASTRODUODENOSCOPY (EGD) WITH PROPOFOL Left 12/25/2015   Procedure: ESOPHAGOGASTRODUODENOSCOPY (EGD) WITH PROPOFOL;  Surgeon: Arta Silence, MD;  Location: [facility] ENDOSCOPY;  Service: Endoscopy;  Laterality: Left;  . FEMORAL BYPASS  2003  . GIVENS CAPSULE STUDY N/A 01/26/2016   Procedure: GIVENS CAPSULE STUDY;  Surgeon: Ronald Lobo, MD;  Location[facility] ENDOSCOPY;  Service: Endoscopy;  Laterality: N/A;  .  HOT HEMOSTASIS N/A 05/13/2016   Procedure: HOT HEMOSTASIS (ARGON PLASMA COAGULATION/BICAP);  Surgeon: Clarene Essex, MD;  Location: [facility] ENDOSCOPY;  Service: Endoscopy;  Laterality: N/A;       Home Medications    Prior to Admission medications   Medication Sig Start Date End Date Taking? Authorizing Provider  acetaminophen (TYLENOL) 325 MG tablet Take 650 mg by mouth every 8 (eight)  hours as needed for mild pain or fever (pain).     [provider]  albuterol (PROVENTIL HFA;VENTOLIN HFA) 108 (90 BASE) MCG/ACT inhaler Inhale 2 puffs into the lungs every 6 (six) hours as needed for shortness of breath.    [provider]  ammonium lactate (LAC-HYDRIN) 12 % lotion Apply 1 application topically daily.    [provider]  apixaban (ELIQUIS) 5 MG TABS tablet Take 1 tablet (5 mg total) by mouth every 12 (twelve) hours. 12/04/16   Larey Dresser, MD  atorvastatin (LIPITOR) 40 MG tablet Take 1 tablet (40 mg total) by mouth daily. Patient taking differently: Take 40 mg by mouth at bedtime.  08/01/14   Sherren Mocha, MD  dextrose (GLUTOSE) 40 % GEL Take 1 Tube by mouth daily as needed for low blood sugar.     [provider]  feeding supplement, GLUCERNA SHAKE, (GLUCERNA SHAKE) LIQD Take 237 mLs by mouth 3 (three) times daily between meals.    [provider]  ferrous sulfate 325 (65 FE) MG tablet Take 325 mg by mouth 2 (two) times daily.    [provider]  finasteride (PROSCAR) 5 MG tablet Take 5 mg by mouth daily.      [provider]  folic acid (FOLVITE) 1 MG tablet Take 1 tablet (1 mg total) by mouth daily. 07/25/16   Mikhail, Velta Addison, DO  furosemide (LASIX) 20 MG tablet Take 40 mg (2 Tablets) the AM and 20 mg (1 Tablet) in the PM 04/20/17   Larey Dresser, MD  gabapentin (NEURONTIN) 300 MG capsule Take 300 mg by mouth 2 (two) times daily.     [provider]  guaifenesin (HUMIBID E) 400 MG TABS tablet Take 400 mg by mouth 3 (three) times daily as needed. For congestion    [provider]  hydrALAZINE (APRESOLINE) 25 MG tablet Take 1 tablet (25 mg total) by mouth 3 (three) times daily. 07/20/17   Larey Dresser, MD  insulin glargine (LANTUS) 100 UNIT/ML injection Inject 40 Units into the skin daily.    [provider]  insulin regular (NOVOLIN R,HUMULIN R) 100 units/mL injection Inject 5 Units  into the skin See admin instructions. Inject 5 units under the skin before breakfast and inject 5 units before evening meal    [provider]  isosorbide mononitrate (IMDUR) 30 MG 24 hr tablet Take 1 tablet (30 mg total) by mouth daily. 07/20/17   Larey Dresser, MD  metoprolol succinate (TOPROL-XL) 50 MG 24 hr tablet Take 1 tablet (50 mg total) by mouth daily. 09/09/16   Clegg, Amy D, NP  Multiple Vitamins-Minerals (MULTIVITAMIN WITH MINERALS) tablet Take 1 tablet by mouth daily.    [provider]  pantoprazole (PROTONIX) 40 MG tablet Take 1 tablet (40 mg total) by mouth daily. 11/29/15   Burns, Arloa Koh, MD  simethicone (MYLICON) 80 MG chewable tablet Chew 80 mg by mouth 3 (three) times daily as needed for flatulence. Take with meals as needed    [provider]  spironolactone (ALDACTONE) 25 MG tablet Take 0.5 tablets (  12.5 mg total) by mouth daily. 02/19/17 02/14/18  Larey Dresser, MD  Tamsulosin HCl (FLOMAX) 0.4 MG CAPS Take 0.4 mg by mouth Nightly.     [provider]  thiamine 100 MG tablet Take 1 tablet (100 mg total) by mouth daily. 07/25/16   Cristal Ford, DO    Family History Family History  Problem Relation Age of Onset  . Cancer Father   . Cancer Mother   . Cancer Sister     Social History Social History  Substance Use Topics  . Smoking status: Current Every Day Smoker    Packs/day: 0.50    Years: 50.00    Types: Cigarettes  . Smokeless tobacco: Never Used     Comment: He has a 50-pack-year hx of tobacco abuse. Currently, smoking about half a pack a day.  . Alcohol use Yes     Comment: Previously drank heavily, and now has a drink every other day or so.     Allergies   Penicillins   Review of Systems Review of Systems  All other systems reviewed and are negative.    Physical Exam Updated Vital Signs BP 140/70 (BP Location: Right Arm)   Pulse 78   Temp 97.9 F (36.6 C) (Oral)   Resp 18   SpO2 100%   Physical Exam    Constitutional: No distress.  Elderly, frail  HENT:  Head: Normocephalic and atraumatic.  Right Ear: External ear normal.  Left Ear: External ear normal.  Eyes: Conjunctivae are normal. Right eye exhibits no discharge. Left eye exhibits no discharge. No scleral icterus.  Neck: Neck supple. No tracheal deviation present.  Cardiovascular: Normal rate, regular rhythm and intact distal pulses.   Pulmonary/Chest: Effort normal and breath sounds normal. No stridor. No respiratory distress. He has no wheezes. He has no rales.  Abdominal: Soft. Bowel sounds are normal. He exhibits no distension. There is no tenderness. There is no rebound and no guarding.  Musculoskeletal: He exhibits no edema.       Lumbar back: He exhibits tenderness and bony tenderness. He exhibits no swelling, no edema and no deformity.  Extremities are warm and well perfused, diminished DP pulses bilaterally  Neurological: He is alert. He has normal strength. No cranial nerve deficit (no facial droop, extraocular movements intact, no slurred speech) or sensory deficit. He exhibits normal muscle tone. He displays no seizure activity. Coordination normal.  Normal strength and sensation plantar flexion and dorsiflexion,  Skin: Skin is warm and dry. No rash noted.  Psychiatric: He has a normal mood and affect.  Nursing note and vitals reviewed.    ED Treatments / Results  Labs (all labs ordered are listed, but only abnormal results are displayed) Labs Reviewed  CBC - Abnormal; Notable for the following:       Result Value   RBC 4.13 (*)    Hemoglobin 12.3 (*)    HCT 37.4 (*)    RDW 15.6 (*)    All other components within normal limits  BASIC METABOLIC PANEL - Abnormal; Notable for the following:    Glucose, Bld 252 (*)    BUN 53 (*)    Creatinine, Ser 2.13 (*)    GFR calc non Af Amer 28 (*)    GFR calc Af Amer 32 (*)    All other components within normal limits  URINALYSIS, ROUTINE W REFLEX MICROSCOPIC      Radiology Dg Lumbar Spine Complete  Result Date: 09/17/2017 CLINICAL DATA:  Chronic severe back  pain. EXAM: LUMBAR SPINE - COMPLETE 4+ VIEW COMPARISON:  None. FINDINGS: There are 5 lumbar type vertebral bodies. No acute fracture or subluxation. Straightening of the normal lumbar lordosis. Alignment is normal. Vertebral body heights are preserved. Multilevel disc height loss, moderate at L3-L4 and L4-L5. Advanced lower lumbar facet arthropathy. Osteopenia. Aortoiliac atherosclerotic vascular disease. IMPRESSION: Moderate degenerative changes of the lumbar spine. No acute osseous abnormality. Electronically Signed   By: Titus Dubin M.D.   On: 09/17/2017 15:59    Procedures Procedures (including critical care time)  Medications Ordered in ED Medications  HYDROmorphone (DILAUDID) injection 0.5 mg (0.5 mg Intravenous Given 09/17/17 1710)     Initial Impression / Assessment and Plan / ED Course  I have reviewed the triage vital signs and the nursing notes.  Pertinent labs & imaging results that were available during my care of the patient were reviewed by me and considered in my medical decision making (see chart for details).  Clinical Course as of Sep 18 1747  Fri Sep 17, 2017  1747 Pt was given his pain medications.  Attempted to walk.  Still having too much pain.  Will order mri, give additional pain meds.  Dr Oleta Mouse will assume care  [JK]    Clinical Course User Index [JK] Dorie Rank, MD   Pt presents with low back pain.  Suspect DDD but concerned about his appetite issues and severity.  Will MR lumbar spine.   Final Clinical Impressions(s) / ED Diagnoses   pending   Dorie Rank, MD 09/17/17 [phone]

## 2017-09-17 NOTE — Discharge Instructions (Signed)
Your MRI shows lesions that could be concerning for prostate cancer. Please follow-up with your urologist from the [facility] regarding further work-up of this.  Please also see your PCP from the [place] regarding further work-up and management. You have areas of stenosis, disc protrusion, and nerve impingement that will require referral to spine doctor at the [facility] through your PCP.  Return for worsening symptoms, including inability to walk, leg weakness/numbness, escalating pain or any other symptoms concerning ot you.  Take stool softener with pain medications. It will make you constipated

## 2017-09-21 ENCOUNTER — Other Ambulatory Visit (HOSPITAL_COMMUNITY): Payer: Self-pay

## 2017-09-21 NOTE — Progress Notes (Signed)
Paramedicine Encounter    Patient ID: ROONEY SWAILS, male    DOB: 1936/02/15, 81 y.o.   MRN: [id number]   Patient Care Team: [facility] as PCP - General  Patient Active Problem List   Diagnosis Date Noted  . Chronic systolic CHF (congestive heart failure) ([place]) 10/29/2016  . Bipolar disorder ([place])   . Alcohol abuse 07/09/2016  . Demand ischemia ([place]) 05/11/2016  . Chronic anticoagulation-Eliquis 05/11/2016  . Elevated troponin 05/11/2016  . Symptomatic anemia 05/11/2016  . Anemia 11/28/2015  . CVA (cerebral infarction)-Augb 2015 08/25/2014  . Stroke ([place]) 08/25/2014  . CKD (chronic kidney disease), stage III 07/17/2014  . Tobacco abuse 06/27/2014  . Pulmonary edema   . Chronic atrial fibrillation ([place])   . Edema 08/20/2011  . DM (diabetes mellitus), type 2 with renal complications ([place]) 61/60/7371  . Hyperlipidemia associated with type 2 diabetes mellitus ([place]) 03/10/2010  . Essential hypertension 03/10/2010  . MYOCARDIAL INFARCTION 03/10/2010  . CAD- last PCI 2011 03/10/2010  . PVD 03/10/2010  . GERD 03/10/2010    Current Outpatient Prescriptions:  .  acetaminophen (TYLENOL) 325 MG tablet, Take 650 mg by mouth every 8 (eight) hours as needed for mild pain or fever (pain). , Disp: , Rfl:  .  albuterol (PROVENTIL HFA;VENTOLIN HFA) 108 (90 BASE) MCG/ACT inhaler, Inhale 2 puffs into the lungs every 6 (six) hours as needed for shortness of breath., Disp: , Rfl:  .  ammonium lactate (LAC-HYDRIN) 12 % lotion, Apply 1 application topically daily., Disp: , Rfl:  .  apixaban (ELIQUIS) 5 MG TABS tablet, Take 1 tablet (5 mg total) by mouth every 12 (twelve) hours., Disp: 60 tablet, Rfl: 3 .  atorvastatin (LIPITOR) 40 MG tablet, Take 1 tablet (40 mg total) by mouth daily. (Patient taking differently: Take 40 mg by mouth at bedtime. ), Disp: 90 tablet, Rfl: 3 .  dextrose (GLUTOSE) 40 % GEL, Take 1 Tube by mouth daily as needed for low blood sugar. , Disp: , Rfl:  .  feeding  supplement, GLUCERNA SHAKE, (GLUCERNA SHAKE) LIQD, Take 237 mLs by mouth 3 (three) times daily between meals., Disp: , Rfl:  .  ferrous sulfate 325 (65 FE) MG tablet, Take 325 mg by mouth 2 (two) times daily., Disp: , Rfl:  .  finasteride (PROSCAR) 5 MG tablet, Take 5 mg by mouth daily.  , Disp: , Rfl:  .  folic acid (FOLVITE) 1 MG tablet, Take 1 tablet (1 mg total) by mouth daily., Disp: 30 tablet, Rfl: 0 .  furosemide (LASIX) 20 MG tablet, Take 40 mg (2 Tablets) the AM and 20 mg (1 Tablet) in the PM, Disp: 90 tablet, Rfl: 3 .  gabapentin (NEURONTIN) 300 MG capsule, Take 300 mg by mouth 2 (two) times daily. , Disp: , Rfl:  .  guaifenesin (HUMIBID E) 400 MG TABS tablet, Take 400 mg by mouth 3 (three) times daily as needed. For congestion, Disp: , Rfl:  .  hydrALAZINE (APRESOLINE) 25 MG tablet, Take 1 tablet (25 mg total) by mouth 3 (three) times daily., Disp: 90 tablet, Rfl: 6 .  HYDROcodone-acetaminophen (NORCO/VICODIN) 5-325 MG tablet, Take 1 tablet by mouth every 6 (six) hours as needed for severe pain., Disp: 6 tablet, Rfl: 0 .  insulin glargine (LANTUS) 100 UNIT/ML injection, Inject 40 Units into the skin daily., Disp: , Rfl:  .  insulin regular (NOVOLIN R,HUMULIN R) 100 units/mL injection, Inject 5 Units into the skin See admin instructions. Inject 5 units under the  skin before breakfast and inject 5 units before evening meal, Disp: , Rfl:  .  isosorbide mononitrate (IMDUR) 30 MG 24 hr tablet, Take 1 tablet (30 mg total) by mouth daily., Disp: 60 tablet, Rfl: 3 .  metoprolol succinate (TOPROL-XL) 50 MG 24 hr tablet, Take 1 tablet (50 mg total) by mouth daily., Disp: 30 tablet, Rfl: 3 .  Multiple Vitamins-Minerals (MULTIVITAMIN WITH MINERALS) tablet, Take 1 tablet by mouth daily., Disp: , Rfl:  .  pantoprazole (PROTONIX) 40 MG tablet, Take 1 tablet (40 mg total) by mouth daily., Disp: 30 tablet, Rfl: 11 .  polyethylene glycol powder (GLYCOLAX/MIRALAX) powder, Take 17 g by mouth daily., Disp: 500  g, Rfl: 0 .  spironolactone (ALDACTONE) 25 MG tablet, Take 0.5 tablets (12.5 mg total) by mouth daily., Disp: 45 tablet, Rfl: 3 .  Tamsulosin HCl (FLOMAX) 0.4 MG CAPS, Take 0.4 mg by mouth Nightly. , Disp: , Rfl:  .  thiamine 100 MG tablet, Take 1 tablet (100 mg total) by mouth daily., Disp: 30 tablet, Rfl: 0 .  simethicone (MYLICON) 80 MG chewable tablet, Chew 80 mg by mouth 3 (three) times daily as needed for flatulence. Take with meals as needed, Disp: , Rfl:  Allergies  Allergen Reactions  . Penicillins Swelling    Has patient had a PCN reaction causing immediate rash, facial/tongue/throat swelling, SOB or lightheadedness with hypotension: Yes Has patient had a PCN reaction causing severe rash involving mucus membranes or skin necrosis: No Has patient had a PCN reaction that required hospitalization: No Has patient had a PCN reaction occurring within the last 10 years: No If all of the above answers are "NO", then may proceed with Cephalosporin use.     Social History   Social History  . Marital status: Divorced    Spouse name: N/A  . Number of children: N/A  . Years of education: N/A   Occupational History  . retired Architect    Social History Main Topics  . Smoking status: Current Every Day Smoker    Packs/day: 0.50    Years: 50.00    Types: Cigarettes  . Smokeless tobacco: Never Used     Comment: He has a 50-pack-year hx of tobacco abuse. Currently, smoking about half a pack a day.  . Alcohol use Yes     Comment: Previously drank heavily, and now has a drink every other day or so.  . Drug use: Yes    Types: Marijuana, Cocaine  . Sexual activity: No   Other Topics Concern  . Not on file   Social History Narrative  . No narrative on file    Physical Exam      Future Appointments Date Time Provider [place]  09/22/2017 1:00 PM [facility] ECHO 1-BUZZ [facility]  11/25/2017 12:00 PM Larey Dresser, MD [facility] None   BP 124/60   Pulse 84   Resp 16    SpO2 95%  Weight yesterday-??? Last visit weight-187 [facility]-350  Pt states he is not doing well, he had to go to ER a few days ago for severe lower back pain, pt states he was given pain meds which relieved his pain and he said someone told him it may be cancer and to f/u with his PCP. He has tried contacting his PCP multiple times with no success.  He has laid in bed all day, minimal moving only when he absolutely have to. No sob, no dizziness, no h/a. We finally got in touch with  PCP and they are suppose to call him, daughter or myself for the f/u appointment and we also ordered him numerous meds from [place]. The [facility] pharmacy called him and advised he had meds to pick up and when he did he picked up isosorbide and he followed the directions to take one a day however he did not know it was already in his pill box so for a couple days he took an extra dose of isosorbide--so I called [facility] and stopped the auto refill-pt thought it was meds the doctor at [facility] called in for him for his back. He has not been able to weigh due to his back pain.    ACTION: Home visit completed  Marylouise Stacks, EMT-Paramedic 09/21/17

## 2017-09-22 ENCOUNTER — Ambulatory Visit (HOSPITAL_COMMUNITY): Admission: RE | Admit: 2017-09-22 | Payer: Medicare Other | Source: Ambulatory Visit

## 2017-09-27 ENCOUNTER — Emergency Department (HOSPITAL_COMMUNITY): Payer: Medicare Other

## 2017-09-27 ENCOUNTER — Emergency Department (HOSPITAL_COMMUNITY)
Admission: EM | Admit: 2017-09-27 | Discharge: 2017-09-27 | Disposition: A | Discharge status: Home / Self Care | Payer: Medicare Other | Attending: Emergency Medicine | Admitting: Emergency Medicine

## 2017-09-27 ENCOUNTER — Encounter (HOSPITAL_COMMUNITY): Payer: Self-pay | Admitting: Emergency Medicine

## 2017-09-27 DIAGNOSIS — E785 Hyperlipidemia, unspecified: Secondary | ICD-10-CM | POA: Diagnosis not present

## 2017-09-27 DIAGNOSIS — F17210 Nicotine dependence, cigarettes, uncomplicated: Secondary | ICD-10-CM | POA: Diagnosis not present

## 2017-09-27 DIAGNOSIS — Z79899 Other long term (current) drug therapy: Secondary | ICD-10-CM | POA: Diagnosis not present

## 2017-09-27 DIAGNOSIS — Z794 Long term (current) use of insulin: Secondary | ICD-10-CM | POA: Diagnosis not present

## 2017-09-27 DIAGNOSIS — R918 Other nonspecific abnormal finding of lung field: Secondary | ICD-10-CM | POA: Diagnosis not present

## 2017-09-27 DIAGNOSIS — N183 Chronic kidney disease, stage 3 (moderate): Secondary | ICD-10-CM | POA: Insufficient documentation

## 2017-09-27 DIAGNOSIS — Z8546 Personal history of malignant neoplasm of prostate: Secondary | ICD-10-CM | POA: Insufficient documentation

## 2017-09-27 DIAGNOSIS — E1169 Type 2 diabetes mellitus with other specified complication: Secondary | ICD-10-CM | POA: Diagnosis not present

## 2017-09-27 DIAGNOSIS — I2510 Atherosclerotic heart disease of native coronary artery without angina pectoris: Secondary | ICD-10-CM | POA: Insufficient documentation

## 2017-09-27 DIAGNOSIS — M545 Low back pain, unspecified: Secondary | ICD-10-CM

## 2017-09-27 DIAGNOSIS — E1122 Type 2 diabetes mellitus with diabetic chronic kidney disease: Secondary | ICD-10-CM | POA: Diagnosis not present

## 2017-09-27 DIAGNOSIS — I5022 Chronic systolic (congestive) heart failure: Secondary | ICD-10-CM | POA: Insufficient documentation

## 2017-09-27 DIAGNOSIS — I129 Hypertensive chronic kidney disease with stage 1 through stage 4 chronic kidney disease, or unspecified chronic kidney disease: Secondary | ICD-10-CM | POA: Insufficient documentation

## 2017-09-27 DIAGNOSIS — I252 Old myocardial infarction: Secondary | ICD-10-CM | POA: Insufficient documentation

## 2017-09-27 LAB — CBC WITH DIFFERENTIAL/PLATELET
Basophils Absolute: 0.1 10*3/uL (ref 0.0–0.1)
Basophils Relative: 1 %
EOS ABS: 0.2 10*3/uL (ref 0.0–0.7)
EOS PCT: 3 %
HCT: 37.5 % — ABNORMAL LOW (ref 39.0–52.0)
HEMOGLOBIN: 12.2 g/dL — AB (ref 13.0–17.0)
LYMPHS ABS: 1.5 10*3/uL (ref 0.7–4.0)
LYMPHS PCT: 23 %
MCH: 29.7 pg (ref 26.0–34.0)
MCHC: 32.5 g/dL (ref 30.0–36.0)
MCV: 91.2 fL (ref 78.0–100.0)
MONOS PCT: 12 %
Monocytes Absolute: 0.8 10*3/uL (ref 0.1–1.0)
NEUTROS PCT: 61 %
Neutro Abs: 4 10*3/uL (ref 1.7–7.7)
Platelets: 304 10*3/uL (ref 150–400)
RBC: 4.11 MIL/uL — AB (ref 4.22–5.81)
RDW: 15.1 % (ref 11.5–15.5)
WBC: 6.6 10*3/uL (ref 4.0–10.5)

## 2017-09-27 LAB — COMPREHENSIVE METABOLIC PANEL
ALK PHOS: 47 U/L (ref 38–126)
ALT: 12 U/L — AB (ref 17–63)
AST: 29 U/L (ref 15–41)
Albumin: 2.7 g/dL — ABNORMAL LOW (ref 3.5–5.0)
Anion gap: 9 (ref 5–15)
BILIRUBIN TOTAL: 0.6 mg/dL (ref 0.3–1.2)
BUN: 47 mg/dL — AB (ref 6–20)
CALCIUM: 8.9 mg/dL (ref 8.9–10.3)
CO2: 27 mmol/L (ref 22–32)
CREATININE: 2.46 mg/dL — AB (ref 0.61–1.24)
Chloride: 102 mmol/L (ref 101–111)
GFR calc Af Amer: 27 mL/min — ABNORMAL LOW (ref >60)
GFR, EST NON AFRICAN AMERICAN: 23 mL/min — AB (ref >60)
Glucose, Bld: 123 mg/dL — ABNORMAL HIGH (ref 65–99)
Potassium: 3.7 mmol/L (ref 3.5–5.1)
Sodium: 138 mmol/L (ref 135–145)
TOTAL PROTEIN: 7.9 g/dL (ref 6.5–8.1)

## 2017-09-27 MED ORDER — SODIUM CHLORIDE 0.9 % IV BOLUS (SEPSIS)
500.0000 mL | Freq: Once | INTRAVENOUS | Status: AC
  Administered 2017-09-27: 500 mL via INTRAVENOUS

## 2017-09-27 NOTE — Discharge Instructions (Signed)
Your CT scan shows that you have an area of your lung that could be an early cancer. You  will need to have formal testing to further evaluate this with a PET scan. This can be done through your family doctor.  Your MRI did not show any acute findings that need surgery nor was there anything that could acutely be fixed.  Your doctor has prescribed you Percocet, he may take 2 tablets every 6 hours as needed for severe pain. Please follow-up at your appointments tomorrow for further evaluation  Please come back to the ER for further evaluation if you have increased pain, or weakness or fevers.  I have offered you our social worker consultation in the morning to talk about getting you placed in a rehab or nursing facility but you have declined and asked to go home instead - you may return at any time.

## 2017-09-27 NOTE — ED Provider Notes (Signed)
[facility] DEPT Provider Note   CSN: [id number] Arrival date & time: 09/27/17  1428     History   Chief Complaint Chief Complaint  Patient presents with  . Back Pain    HPI [name] is a 81 y.o. male.  HPI  The pt is a 81 y/o male with hx of Afib, Bipolar, CAD and hx of Prostate and Lung CA in the past - pt states in remission for both - he actively smokes 1 PPD and drinks 4 burbon / day.  He has had unintentional weight loss - had decreased appetite, He was seen on September 21 in the emergency department because of back pain. The MRI at that time showed that he had possible metastatic disease to his spine, there was also some spinal stenosis of the L spine. At this time the patient states he has had ongoing back pain but this is getting better with the pain medications that were given.  He has weakness and requires some assistance with walking. He reports that he wants to know what the problem is and he wants it fixed.  That being said the patient is followed up at the [facility]	[place], he saw his family doctor 5 days ago and has been scheduled for follow-up visits with 4 different specialists within the next 48 hours.  Past Medical History:  Diagnosis Date  . Anemia 11/28/2015  . Atrial fibrillation ([place])   . Bipolar disorder ([place])   . CAD (coronary artery disease)    a.  NSTEMI treated with PCI Feb 2011 with a DES.([facility]);   b. cath 2/11: D1 stents x 2 ok, AV groove CFX occluded with dist AV CFX filled by L-L collats, RCA occluded, OM2 90-95% (treated with PCI)  . CKD (chronic kidney disease), stage III ([place])   . DM (diabetes mellitus) ([place])   . GERD (gastroesophageal reflux disease)   . GI bleed-recent SB study-AVMs   . Heart failure ([place])   . HTN (hypertension)    echo 2/11: EF 50-55%, diast dyfxn, severe LVH, inf HK, LAE  . Hyperlipidemia   . Hypertensive emergency 06/24/2014  . MI (myocardial infarction) ([place])   . Prostate cancer [facility])    status post radiation therapy in 2003  . Pulmonary edema   . PVD (peripheral vascular disease) ([place])   . Respiratory difficulty 06/24/2014   intubated   . Shortness of breath dyspnea   . Stroke [facility]) 07/2014    Patient Active Problem List   Diagnosis Date Noted  . Chronic systolic CHF (congestive heart failure) ([place]) 10/29/2016  . Bipolar disorder ([place])   . Alcohol abuse 07/09/2016  . Demand ischemia ([place]) 05/11/2016  . Chronic anticoagulation-Eliquis 05/11/2016  . Elevated troponin 05/11/2016  . Symptomatic anemia 05/11/2016  . Anemia 11/28/2015  . CVA (cerebral infarction)-Augb 2015 08/25/2014  . Stroke ([place]) 08/25/2014  . CKD (chronic kidney disease), stage III ([place]) 07/17/2014  . Tobacco abuse 06/27/2014  . Pulmonary edema   . Chronic atrial fibrillation ([place])   . Edema 08/20/2011  . DM (diabetes mellitus), type 2 with renal complications ([place]) 10/93/2355  . Hyperlipidemia associated with type 2 diabetes mellitus ([place]) 03/10/2010  . Essential hypertension 03/10/2010  . MYOCARDIAL INFARCTION 03/10/2010  . CAD- last PCI 2011 03/10/2010  . PVD 03/10/2010  . GERD 03/10/2010    Past Surgical History:  Procedure Laterality Date  . COLONOSCOPY N/A 01/25/2016   Procedure: COLONOSCOPY;  Surgeon: Ronald Lobo, MD;  Location: [facility] ENDOSCOPY;  Service: Endoscopy;  Laterality: N/A;  . ESOPHAGOGASTRODUODENOSCOPY N/A 11/29/2015   Procedure: ESOPHAGOGASTRODUODENOSCOPY (EGD);  Surgeon: Wonda Horner, MD;  [facility];  Service: Endoscopy;  Laterality: N/A;  . ESOPHAGOGASTRODUODENOSCOPY N/A 05/13/2016   Procedure: ESOPHAGOGASTRODUODENOSCOPY (EGD);  Surgeon: Clarene Essex, MD;  Location: [facility] ENDOSCOPY;  Service: Endoscopy;  Laterality: N/A;  . ESOPHAGOGASTRODUODENOSCOPY (EGD) WITH PROPOFOL Left 12/25/2015   Procedure: ESOPHAGOGASTRODUODENOSCOPY (EGD) WITH PROPOFOL;  Surgeon: Arta Silence, MD;  Location: [facility] ENDOSCOPY;  Service: Endoscopy;  Laterality: Left;  . FEMORAL BYPASS  2003    . GIVENS CAPSULE STUDY N/A 01/26/2016   Procedure: GIVENS CAPSULE STUDY;  Surgeon: Ronald Lobo, MD;  Location: [facility] ENDOSCOPY;  Service: Endoscopy;  Laterality: N/A;  . HOT HEMOSTASIS N/A 05/13/2016   Procedure: HOT HEMOSTASIS (ARGON PLASMA COAGULATION/BICAP);  Surgeon: Clarene Essex, MD;  Location: [facility] ENDOSCOPY;  Service: Endoscopy;  Laterality: N/A;       Home Medications    Prior to Admission medications   Medication Sig Start Date End Date Taking? Authorizing Provider  acetaminophen (TYLENOL) 325 MG tablet Take 650 mg by mouth every 8 (eight) hours as needed for mild pain or fever (pain).     [provider]  albuterol (PROVENTIL HFA;VENTOLIN HFA) 108 (90 BASE) MCG/ACT inhaler Inhale 2 puffs into the lungs every 6 (six) hours as needed for shortness of breath.    [provider]  ammonium lactate (LAC-HYDRIN) 12 % lotion Apply 1 application topically daily.    [provider]  apixaban (ELIQUIS) 5 MG TABS tablet Take 1 tablet (5 mg total) by mouth every 12 (twelve) hours. 12/04/16   Larey Dresser, MD  atorvastatin (LIPITOR) 40 MG tablet Take 1 tablet (40 mg total) by mouth daily. Patient taking differently: Take 40 mg by mouth at bedtime.  08/01/14   Sherren Mocha, MD  dextrose (GLUTOSE) 40 % GEL Take 1 Tube by mouth daily as needed for low blood sugar.     [provider]  feeding supplement, GLUCERNA SHAKE, (GLUCERNA SHAKE) LIQD Take 237 mLs by mouth 3 (three) times daily between meals.    [provider]  ferrous sulfate 325 (65 FE) MG tablet Take 325 mg by mouth 2 (two) times daily.    [provider]  finasteride (PROSCAR) 5 MG tablet Take 5 mg by mouth daily.      [provider]  folic acid (FOLVITE) 1 MG tablet Take 1 tablet (1 mg total) by mouth daily. 07/25/16   Mikhail, Velta Addison, DO  furosemide (LASIX) 20 MG tablet Take 40 mg (2 Tablets) the AM and 20 mg (1 Tablet) in the PM 04/20/17   Larey Dresser, MD  gabapentin  (NEURONTIN) 300 MG capsule Take 300 mg by mouth 2 (two) times daily.     [provider]  guaifenesin (HUMIBID E) 400 MG TABS tablet Take 400 mg by mouth 3 (three) times daily as needed. For congestion    [provider]  hydrALAZINE (APRESOLINE) 25 MG tablet Take 1 tablet (25 mg total) by mouth 3 (three) times daily. 07/20/17   Larey Dresser, MD  HYDROcodone-acetaminophen (NORCO/VICODIN) 5-325 MG tablet Take 1 tablet by mouth every 6 (six) hours as needed for severe pain. 09/17/17   Forde Dandy, MD  insulin glargine (LANTUS) 100 UNIT/ML injection Inject 40 Units into the skin daily.    [provider]  insulin regular (NOVOLIN R,HUMULIN R) 100 units/mL injection Inject 5 Units into the skin See admin instructions. Inject 5 units under the  skin before breakfast and inject 5 units before evening meal    [provider]  isosorbide mononitrate (IMDUR) 30 MG 24 hr tablet Take 1 tablet (30 mg total) by mouth daily. 07/20/17   Larey Dresser, MD  metoprolol succinate (TOPROL-XL) 50 MG 24 hr tablet Take 1 tablet (50 mg total) by mouth daily. 09/09/16   Clegg, Amy D, NP  Multiple Vitamins-Minerals (MULTIVITAMIN WITH MINERALS) tablet Take 1 tablet by mouth daily.    [provider]  pantoprazole (PROTONIX) 40 MG tablet Take 1 tablet (40 mg total) by mouth daily. 11/29/15   Burns, Arloa Koh, MD  polyethylene glycol powder (GLYCOLAX/MIRALAX) powder Take 17 g by mouth daily. 09/17/17   Forde Dandy, MD  simethicone (MYLICON) 80 MG chewable tablet Chew 80 mg by mouth 3 (three) times daily as needed for flatulence. Take with meals as needed    [provider]  spironolactone (ALDACTONE) 25 MG tablet Take 0.5 tablets (12.5 mg total) by mouth daily. 02/19/17 02/14/18  Larey Dresser, MD  Tamsulosin HCl (FLOMAX) 0.4 MG CAPS Take 0.4 mg by mouth Nightly.     [provider]  thiamine 100 MG tablet Take 1 tablet (100 mg total) by mouth daily. 07/25/16    Cristal Ford, DO    Family History Family History  Problem Relation Age of Onset  . Cancer Father   . Cancer Mother   . Cancer Sister     Social History Social History  Substance Use Topics  . Smoking status: Current Every Day Smoker    Packs/day: 0.50    Years: 50.00    Types: Cigarettes  . Smokeless tobacco: Never Used     Comment: He has a 50-pack-year hx of tobacco abuse. Currently, smoking about half a pack a day.  . Alcohol use Yes     Comment: Previously drank heavily, and now has a drink every other day or so.     Allergies   Penicillins   Review of Systems Review of Systems  All other systems reviewed and are negative.    Physical Exam Updated Vital Signs BP (!) 182/62 (BP Location: Left Arm)   Pulse 93   Temp 98.4 F (36.9 C) (Oral)   Resp 18   Ht 6' (1.829 m)   Wt 95.3 kg (210 lb)   SpO2 96%   BMI 28.48 kg/m   Physical Exam  Constitutional: He appears well-developed and well-nourished. No distress.  HENT:  Head: Normocephalic and atraumatic.  Mouth/Throat: No oropharyngeal exudate.  Slightly dry MM, OP clear otherwise.  Eyes: Pupils are equal, round, and reactive to light. Conjunctivae and EOM are normal. Right eye exhibits no discharge. Left eye exhibits no discharge. No scleral icterus.  Neck: Normal range of motion. Neck supple. No JVD present. No thyromegaly present.  Cardiovascular: Normal rate, regular rhythm, normal heart sounds and intact distal pulses.  Exam reveals no gallop and no friction rub.   No murmur heard. Pulmonary/Chest: Effort normal and breath sounds normal. No respiratory distress. He has no wheezes. He has no rales.  Lungs clear, no rales, no inc WOB and no wheezing  Abdominal: Soft. Bowel sounds are normal. He exhibits no distension and no mass. There is no tenderness.  Musculoskeletal: Normal range of motion. He exhibits no edema, tenderness or deformity.  Low back with ttp  Lymphadenopathy:    He has no cervical  adenopathy.  Neurological: He is alert. Coordination normal.  Mentation normal Speech normal Memory normal  Follows commands Strong grips bilaterally Can SLR with minimal difficulty Sensation to legs normal  Skin: Skin is warm and dry. No rash noted. No erythema.  Psychiatric: He has a normal mood and affect. His behavior is normal.  Nursing note and vitals reviewed.    ED Treatments / Results  Labs (all labs ordered are listed, but only abnormal results are displayed) Labs Reviewed  CBC WITH DIFFERENTIAL/PLATELET - Abnormal; Notable for the following:       Result Value   RBC 4.11 (*)    Hemoglobin 12.2 (*)    HCT 37.5 (*)    All other components within normal limits  COMPREHENSIVE METABOLIC PANEL - Abnormal; Notable for the following:    Glucose, Bld 123 (*)    BUN 47 (*)    Creatinine, Ser 2.46 (*)    Albumin 2.7 (*)    ALT 12 (*)    GFR calc non Af Amer 23 (*)    GFR calc Af Amer 27 (*)    All other components within normal limits    EKG  EKG Interpretation None       Radiology Ct Abdomen Pelvis Wo Contrast  Result Date: 09/27/2017 CLINICAL DATA:  Back and abdominal pain for 1 month. Unintentional weight loss. EXAM: CT CHEST, ABDOMEN AND PELVIS WITHOUT CONTRAST TECHNIQUE: Multidetector CT imaging of the chest, abdomen and pelvis was performed following the standard protocol without IV contrast. COMPARISON:  None. FINDINGS: CT CHEST FINDINGS Cardiovascular: Mild cardiomegaly. Aortic and coronary artery atherosclerosis. Mediastinum/Lymph Nodes: No masses or pathologically enlarged lymph nodes identified on this unenhanced exam. Lungs/Pleura: Chronic interstitial lung disease is seen with subpleural honeycombing, consistent with chronic interstitial fibrosis. Asymmetric soft tissue density is seen in the medial right lung apex abutting the mediastinum which measures 4.4 x 2.5 x 2.7 cm. Differential diagnosis includes asymmetric scarring versus carcinoma. No evidence of  pleural effusion. Musculoskeletal:  No suspicious bone lesions identified. CT ABDOMEN AND PELVIS FINDINGS Hepatobiliary: No masses visualized on this unenhanced exam. Gallbladder is unremarkable. Pancreas: No mass or inflammatory changes identified on this unenhanced exam. Spleen: Within normal limits in size. Adrenals/Urinary Tract: No evidence of urolithiasis or hydronephrosis. Small fluid attenuation right renal cyst. Moderately dilated urinary bladder with a few small diverticula, likely due to chronic bladder outlet obstruction. Stomach/Bowel: No evidence of obstruction, inflammatory process, or abnormal fluid collections. Normal appendix visualized. Vascular/Lymphatic: No pathologically enlarged lymph nodes identified. No abdominal aortic aneurysm. Aortic atherosclerosis. Reproductive:  No mass or other significant abnormality identified. Other:  None. Musculoskeletal:  No suspicious bone lesions identified. IMPRESSION: Chronic pulmonary interstitial fibrosis. 4 cm asymmetric soft tissue density in medial right lung apex. Differential diagnosis includes asymmetric scarring/ fibrosis and bronchogenic carcinoma. Consider PET-CT scan for further evaluation. No evidence of neoplasm or metastatic disease within the abdomen or pelvis. Findings consistent with chronic bladder outlet obstruction. Electronically Signed   By: Earle Gell M.D.   On: 09/27/2017 20:50   Ct Chest Wo Contrast  Result Date: 09/27/2017 CLINICAL DATA:  Back and abdominal pain for 1 month. Unintentional weight loss. EXAM: CT CHEST, ABDOMEN AND PELVIS WITHOUT CONTRAST TECHNIQUE: Multidetector CT imaging of the chest, abdomen and pelvis was performed following the standard protocol without IV contrast. COMPARISON:  None. FINDINGS: CT CHEST FINDINGS Cardiovascular: Mild cardiomegaly. Aortic and coronary artery atherosclerosis. Mediastinum/Lymph Nodes: No masses or pathologically enlarged lymph nodes identified on this unenhanced exam.  Lungs/Pleura: Chronic interstitial lung disease is seen with subpleural honeycombing, consistent with chronic interstitial fibrosis.  Asymmetric soft tissue density is seen in the medial right lung apex abutting the mediastinum which measures 4.4 x 2.5 x 2.7 cm. Differential diagnosis includes asymmetric scarring versus carcinoma. No evidence of pleural effusion. Musculoskeletal:  No suspicious bone lesions identified. CT ABDOMEN AND PELVIS FINDINGS Hepatobiliary: No masses visualized on this unenhanced exam. Gallbladder is unremarkable. Pancreas: No mass or inflammatory changes identified on this unenhanced exam. Spleen: Within normal limits in size. Adrenals/Urinary Tract: No evidence of urolithiasis or hydronephrosis. Small fluid attenuation right renal cyst. Moderately dilated urinary bladder with a few small diverticula, likely due to chronic bladder outlet obstruction. Stomach/Bowel: No evidence of obstruction, inflammatory process, or abnormal fluid collections. Normal appendix visualized. Vascular/Lymphatic: No pathologically enlarged lymph nodes identified. No abdominal aortic aneurysm. Aortic atherosclerosis. Reproductive:  No mass or other significant abnormality identified. Other:  None. Musculoskeletal:  No suspicious bone lesions identified. IMPRESSION: Chronic pulmonary interstitial fibrosis. 4 cm asymmetric soft tissue density in medial right lung apex. Differential diagnosis includes asymmetric scarring/ fibrosis and bronchogenic carcinoma. Consider PET-CT scan for further evaluation. No evidence of neoplasm or metastatic disease within the abdomen or pelvis. Findings consistent with chronic bladder outlet obstruction. Electronically Signed   By: Earle Gell M.D.   On: 09/27/2017 20:50   Dg Chest Port 1 View  Result Date: 09/27/2017 CLINICAL DATA:  Weight loss.  History of lung cancer.  Back pain. EXAM: PORTABLE CHEST 1 VIEW COMPARISON:  Multiple exams, including 07/22/2016 FINDINGS: Progressive  density and convexity along the right upper mediastinal margin. While I suspect that there some underlying tortuous vasculature, and apical lordotic projection may be playing a role in exaggerating this density, I cannot exclude superimposed adjacent malignancy or pneumonia. Mild enlargement of the cardiopericardial silhouette. Coronary stent. Atherosclerotic aortic arch. Vascular calcifications in the axillary arteries. Stable faint basilar interstitial accentuation. IMPRESSION: 1. Abnormal density along the right upper mediastinal margin. Although this could be from tortuous vasculature, chest CT is recommended to exclude mass or pneumonia along the right lung apex. 2. Aortic Atherosclerosis (ICD10-I70.0). Coronary and axillary atherosclerosis. 3. Mild enlargement of the cardiopericardial silhouette. Electronically Signed   By: Van Clines M.D.   On: 09/27/2017 18:21    Procedures Procedures (including critical care time)  Medications Ordered in ED Medications  sodium chloride 0.9 % bolus 500 mL (0 mLs Intravenous Stopped 09/27/17 1900)     Initial Impression / Assessment and Plan / ED Course  I have reviewed the triage vital signs and the nursing notes.  Pertinent labs & imaging results that were available during my care of the patient were reviewed by me and considered in my medical decision making (see chart for details).     The patient's lab work has essentially at his baseline with his renal function, blood counts and electrolytes. The CT scan of his abdomen and pelvis shows that he has chronic interstitial fibrosis, he has a 4 a metered density in the medial right lung apex. There is some possibility that this could be bronchogenic carcinoma. There is no other evidence of metastatic disease in the abdomen or the pelvis. The patient does not have a bladder L Lett obstruction and states that he has been urinating in his diaper. Again the patient is able to straight leg raise, he has  sensation in his legs, he feels like his pain is limiting what he can do at home. He does have appointments tomorrow, he states that he has a family member that can help him and he has  in-home nurse aide care several hours a day. I have offered him evaluation by social work in the morning but he has refused and states that he would rather go home. Ultimately the patient wants to be admitted for a complete workup to "fix my problem" while he is at the hospital however at this time I do not have a definable etiology of his pain such that ultimately a definite antidote can be prescribed or surgery to fix the specific problems. He will need to follow-up at his appointments tomorrow. The patient expressed his understanding and agreement. He has a bottle full of Percocet 5/325 that he is taking with some relief of his pain.  Fin.al Clinical Impressions(s) / ED Diagnoses   Final diagnoses:  Acute midline low back pain without sciatica  Lung mass    New Prescriptions New Prescriptions   No medications on file     Noemi Chapel, MD 09/27/17 2112

## 2017-09-27 NOTE — ED Notes (Signed)
Signature pad froze, pt stable and wheeled out to waiting room, daughter will pick him up

## 2017-09-27 NOTE — ED Triage Notes (Signed)
Pt to ER for persistent lower back pain, states has been taking his pain medication but "it won't kick it completely." When asked regarding his follow up, patient states "I'm scared I'll hurt myself even more driving all over the place." pt in NAD at triage. VSS.

## 2017-09-28 ENCOUNTER — Other Ambulatory Visit (HOSPITAL_COMMUNITY): Payer: Self-pay

## 2017-09-28 NOTE — Progress Notes (Signed)
Paramedicine Encounter    Patient ID: [name], male    DOB: Jul 05, 1936, 81 y.o.   MRN: [id number]   Patient Care Team: [facility] - General  Patient Active Problem List   Diagnosis Date Noted  . Chronic systolic CHF (congestive heart failure) ([place]) 10/29/2016  . Bipolar disorder ([place])   . Alcohol abuse 07/09/2016  . Demand ischemia ([place]) 05/11/2016  . Chronic anticoagulation-Eliquis 05/11/2016  . Elevated troponin 05/11/2016  . Symptomatic anemia 05/11/2016  . Anemia 11/28/2015  . CVA (cerebral infarction)-Augb 2015 08/25/2014  . Stroke ([place]) 08/25/2014  . CKD (chronic kidney disease), stage III ([place]) 07/17/2014  . Tobacco abuse 06/27/2014  . Pulmonary edema   . Chronic atrial fibrillation ([place])   . Edema 08/20/2011  . DM (diabetes mellitus), type 2 with renal complications ([place]) 92/42/6834  . Hyperlipidemia associated with type 2 diabetes mellitus ([place]) 03/10/2010  . Essential hypertension 03/10/2010  . MYOCARDIAL INFARCTION 03/10/2010  . CAD- last PCI 2011 03/10/2010  . PVD 03/10/2010  . GERD 03/10/2010    Current Outpatient Prescriptions:  .  acetaminophen (TYLENOL) 325 MG tablet, Take 650 mg by mouth every 8 (eight) hours as needed for mild pain or fever (pain). , Disp: , Rfl:  .  albuterol (PROVENTIL HFA;VENTOLIN HFA) 108 (90 BASE) MCG/ACT inhaler, Inhale 2 puffs into the lungs every 6 (six) hours as needed for shortness of breath., Disp: , Rfl:  .  ammonium lactate (LAC-HYDRIN) 12 % lotion, Apply 1 application topically daily., Disp: , Rfl:  .  apixaban (ELIQUIS) 5 MG TABS tablet, Take 1 tablet (5 mg total) by mouth every 12 (twelve) hours., Disp: 60 tablet, Rfl: 3 .  atorvastatin (LIPITOR) 40 MG tablet, Take 1 tablet (40 mg total) by mouth daily. (Patient taking differently: Take 40 mg by mouth at bedtime. ), Disp: 90 tablet, Rfl: 3 .  dextrose (GLUTOSE) 40 % GEL, Take 1 Tube by mouth daily as needed for low blood sugar. , Disp: , Rfl:  .   feeding supplement, GLUCERNA SHAKE, (GLUCERNA SHAKE) LIQD, Take 237 mLs by mouth 3 (three) times daily between meals., Disp: , Rfl:  .  ferrous sulfate 325 (65 FE) MG tablet, Take 325 mg by mouth 2 (two) times daily., Disp: , Rfl:  .  finasteride (PROSCAR) 5 MG tablet, Take 5 mg by mouth daily.  , Disp: , Rfl:  .  folic acid (FOLVITE) 1 MG tablet, Take 1 tablet (1 mg total) by mouth daily., Disp: 30 tablet, Rfl: 0 .  furosemide (LASIX) 20 MG tablet, Take 40 mg (2 Tablets) the AM and 20 mg (1 Tablet) in the PM, Disp: 90 tablet, Rfl: 3 .  gabapentin (NEURONTIN) 300 MG capsule, Take 300 mg by mouth 2 (two) times daily. , Disp: , Rfl:  .  guaifenesin (HUMIBID E) 400 MG TABS tablet, Take 400 mg by mouth 3 (three) times daily as needed. For congestion, Disp: , Rfl:  .  hydrALAZINE (APRESOLINE) 25 MG tablet, Take 1 tablet (25 mg total) by mouth 3 (three) times daily., Disp: 90 tablet, Rfl: 6 .  HYDROcodone-acetaminophen (NORCO/VICODIN) 5-325 MG tablet, Take 1 tablet by mouth every 6 (six) hours as needed for severe pain., Disp: 6 tablet, Rfl: 0 .  insulin glargine (LANTUS) 100 UNIT/ML injection, Inject 40 Units into the skin daily., Disp: , Rfl:  .  insulin regular (NOVOLIN R,HUMULIN R) 100 units/mL injection, Inject 5 Units into the skin See admin instructions. Inject 5 units under  the skin before breakfast and inject 5 units before evening meal, Disp: , Rfl:  .  isosorbide mononitrate (IMDUR) 30 MG 24 hr tablet, Take 1 tablet (30 mg total) by mouth daily., Disp: 60 tablet, Rfl: 3 .  metoprolol succinate (TOPROL-XL) 50 MG 24 hr tablet, Take 1 tablet (50 mg total) by mouth daily., Disp: 30 tablet, Rfl: 3 .  Multiple Vitamins-Minerals (MULTIVITAMIN WITH MINERALS) tablet, Take 1 tablet by mouth daily., Disp: , Rfl:  .  pantoprazole (PROTONIX) 40 MG tablet, Take 1 tablet (40 mg total) by mouth daily., Disp: 30 tablet, Rfl: 11 .  polyethylene glycol powder (GLYCOLAX/MIRALAX) powder, Take 17 g by mouth daily.,  Disp: 500 g, Rfl: 0 .  simethicone (MYLICON) 80 MG chewable tablet, Chew 80 mg by mouth 3 (three) times daily as needed for flatulence. Take with meals as needed, Disp: , Rfl:  .  spironolactone (ALDACTONE) 25 MG tablet, Take 0.5 tablets (12.5 mg total) by mouth daily., Disp: 45 tablet, Rfl: 3 .  Tamsulosin HCl (FLOMAX) 0.4 MG CAPS, Take 0.4 mg by mouth Nightly. , Disp: , Rfl:  .  thiamine 100 MG tablet, Take 1 tablet (100 mg total) by mouth daily., Disp: 30 tablet, Rfl: 0 Allergies  Allergen Reactions  . Penicillins Swelling    Has patient had a PCN reaction causing immediate rash, facial/tongue/throat swelling, SOB or lightheadedness with hypotension: Yes Has patient had a PCN reaction causing severe rash involving mucus membranes or skin necrosis: No Has patient had a PCN reaction that required hospitalization: No Has patient had a PCN reaction occurring within the last 10 years: No If all of the above answers are "NO", then may proceed with Cephalosporin use.     Social History   Social History  . Marital status: Divorced    Spouse name: N/A  . Number of children: N/A  . Years of education: N/A   Occupational History  . retired Architect    Social History Main Topics  . Smoking status: Current Every Day Smoker    Packs/day: 0.50    Years: 50.00    Types: Cigarettes  . Smokeless tobacco: Never Used     Comment: He has a 50-pack-year hx of tobacco abuse. Currently, smoking about half a pack a day.  . Alcohol use Yes     Comment: Previously drank heavily, and now has a drink every other day or so.  . Drug use: Yes    Types: Marijuana, Cocaine  . Sexual activity: No   Other Topics Concern  . Not on file   Social History Narrative  . No narrative on file    Physical Exam      Future Appointments Date Time Provider [place]  10/04/2017 2:00 PM [facility] ECHO 1-BUZZ [facility]  11/25/2017 12:00 PM Larey Dresser, MD [facility] None   BP (!) 104/56   Pulse  64   Resp 16   SpO2 94%  Weight yesterday-? Last visit weight-?  Pt reports he is doing ok, pt has been at his PCP doing tests all day at the [place] to figure out more accurately what his back pain is stemming from. He does have swelling to his legs, he missed numerous doses of his pills this week, has not been weighing daily due to his back pain, he needs refills of gabapentin-we ordered them at last visit so it may be in his mail box. His b/p was on the low side he reports that today he was  given some stuff before he laid on the exam room for his bone scan and it had to urinate a lot. He is going to drink extra glass of water this evening. He has another appointment with [facility] this [facility]. He needs another handicap app form-he lost the one I brought him last time.     ACTION: Home visit completed  Marylouise Stacks, EMT-Paramedic 09/28/17

## 2017-10-04 ENCOUNTER — Ambulatory Visit (HOSPITAL_COMMUNITY)
Admission: RE | Admit: 2017-10-04 | Discharge: 2017-10-04 | Disposition: A | Discharge status: Home / Self Care | Payer: Medicare Other | Source: Ambulatory Visit | Attending: Cardiology | Admitting: Cardiology

## 2017-10-04 DIAGNOSIS — I340 Nonrheumatic mitral (valve) insufficiency: Secondary | ICD-10-CM | POA: Insufficient documentation

## 2017-10-04 DIAGNOSIS — I5022 Chronic systolic (congestive) heart failure: Secondary | ICD-10-CM | POA: Diagnosis present

## 2017-10-04 DIAGNOSIS — F17210 Nicotine dependence, cigarettes, uncomplicated: Secondary | ICD-10-CM | POA: Insufficient documentation

## 2017-10-04 NOTE — Progress Notes (Signed)
  Echocardiogram 2D Echocardiogram has been performed.  [name] 10/04/2017, 2:59 PM

## 2017-10-05 ENCOUNTER — Other Ambulatory Visit (HOSPITAL_COMMUNITY): Payer: Self-pay

## 2017-10-05 NOTE — Progress Notes (Signed)
Paramedicine Encounter    Patient ID: GERRICK RAY, male    DOB: 1936-03-21, 81 y.o.   MRN: [id number]   Patient Care Team: [facility] - General  Patient Active Problem List   Diagnosis Date Noted  . Chronic systolic CHF (congestive heart failure) ([place]) 10/29/2016  . Bipolar disorder ([place])   . Alcohol abuse 07/09/2016  . Demand ischemia ([place]) 05/11/2016  . Chronic anticoagulation-Eliquis 05/11/2016  . Elevated troponin 05/11/2016  . Symptomatic anemia 05/11/2016  . Anemia 11/28/2015  . CVA (cerebral infarction)-Augb 2015 08/25/2014  . Stroke ([place]) 08/25/2014  . CKD (chronic kidney disease), stage III ([place]) 07/17/2014  . Tobacco abuse 06/27/2014  . Pulmonary edema   . Chronic atrial fibrillation ([place])   . Edema 08/20/2011  . DM (diabetes mellitus), type 2 with renal complications ([place]) 01/05/3234  . Hyperlipidemia associated with type 2 diabetes mellitus ([place]) 03/10/2010  . Essential hypertension 03/10/2010  . MYOCARDIAL INFARCTION 03/10/2010  . CAD- last PCI 2011 03/10/2010  . PVD 03/10/2010  . GERD 03/10/2010    Current Outpatient Prescriptions:  .  acetaminophen (TYLENOL) 325 MG tablet, Take 650 mg by mouth every 8 (eight) hours as needed for mild pain or fever (pain). , Disp: , Rfl:  .  albuterol (PROVENTIL HFA;VENTOLIN HFA) 108 (90 BASE) MCG/ACT inhaler, Inhale 2 puffs into the lungs every 6 (six) hours as needed for shortness of breath., Disp: , Rfl:  .  ammonium lactate (LAC-HYDRIN) 12 % lotion, Apply 1 application topically daily., Disp: , Rfl:  .  apixaban (ELIQUIS) 5 MG TABS tablet, Take 1 tablet (5 mg total) by mouth every 12 (twelve) hours., Disp: 60 tablet, Rfl: 3 .  atorvastatin (LIPITOR) 40 MG tablet, Take 1 tablet (40 mg total) by mouth daily. (Patient taking differently: Take 40 mg by mouth at bedtime. ), Disp: 90 tablet, Rfl: 3 .  dextrose (GLUTOSE) 40 % GEL, Take 1 Tube by mouth daily as needed for low blood sugar. , Disp: , Rfl:  .   feeding supplement, GLUCERNA SHAKE, (GLUCERNA SHAKE) LIQD, Take 237 mLs by mouth 3 (three) times daily between meals., Disp: , Rfl:  .  ferrous sulfate 325 (65 FE) MG tablet, Take 325 mg by mouth 2 (two) times daily., Disp: , Rfl:  .  finasteride (PROSCAR) 5 MG tablet, Take 5 mg by mouth daily.  , Disp: , Rfl:  .  folic acid (FOLVITE) 1 MG tablet, Take 1 tablet (1 mg total) by mouth daily., Disp: 30 tablet, Rfl: 0 .  furosemide (LASIX) 20 MG tablet, Take 40 mg (2 Tablets) the AM and 20 mg (1 Tablet) in the PM, Disp: 90 tablet, Rfl: 3 .  gabapentin (NEURONTIN) 300 MG capsule, Take 300 mg by mouth 2 (two) times daily. , Disp: , Rfl:  .  hydrALAZINE (APRESOLINE) 25 MG tablet, Take 1 tablet (25 mg total) by mouth 3 (three) times daily., Disp: 90 tablet, Rfl: 6 .  HYDROcodone-acetaminophen (NORCO/VICODIN) 5-325 MG tablet, Take 1 tablet by mouth every 6 (six) hours as needed for severe pain., Disp: 6 tablet, Rfl: 0 .  insulin glargine (LANTUS) 100 UNIT/ML injection, Inject 40 Units into the skin daily., Disp: , Rfl:  .  insulin regular (NOVOLIN R,HUMULIN R) 100 units/mL injection, Inject 5 Units into the skin See admin instructions. Inject 5 units under the skin before breakfast and inject 5 units before evening meal, Disp: , Rfl:  .  isosorbide mononitrate (IMDUR) 30 MG 24 hr tablet, Take  1 tablet (30 mg total) by mouth daily., Disp: 60 tablet, Rfl: 3 .  metoprolol succinate (TOPROL-XL) 50 MG 24 hr tablet, Take 1 tablet (50 mg total) by mouth daily., Disp: 30 tablet, Rfl: 3 .  Multiple Vitamins-Minerals (MULTIVITAMIN WITH MINERALS) tablet, Take 1 tablet by mouth daily., Disp: , Rfl:  .  pantoprazole (PROTONIX) 40 MG tablet, Take 1 tablet (40 mg total) by mouth daily., Disp: 30 tablet, Rfl: 11 .  polyethylene glycol powder (GLYCOLAX/MIRALAX) powder, Take 17 g by mouth daily., Disp: 500 g, Rfl: 0 .  spironolactone (ALDACTONE) 25 MG tablet, Take 0.5 tablets (12.5 mg total) by mouth daily., Disp: 45 tablet,  Rfl: 3 .  Tamsulosin HCl (FLOMAX) 0.4 MG CAPS, Take 0.4 mg by mouth Nightly. , Disp: , Rfl:  .  thiamine 100 MG tablet, Take 1 tablet (100 mg total) by mouth daily., Disp: 30 tablet, Rfl: 0 .  guaifenesin (HUMIBID E) 400 MG TABS tablet, Take 400 mg by mouth 3 (three) times daily as needed. For congestion, Disp: , Rfl:  .  simethicone (MYLICON) 80 MG chewable tablet, Chew 80 mg by mouth 3 (three) times daily as needed for flatulence. Take with meals as needed, Disp: , Rfl:  Allergies  Allergen Reactions  . Penicillins Swelling    Has patient had a PCN reaction causing immediate rash, facial/tongue/throat swelling, SOB or lightheadedness with hypotension: Yes Has patient had a PCN reaction causing severe rash involving mucus membranes or skin necrosis: No Has patient had a PCN reaction that required hospitalization: No Has patient had a PCN reaction occurring within the last 10 years: No If all of the above answers are "NO", then may proceed with Cephalosporin use.     Social History   Social History  . Marital status: Divorced    Spouse name: N/A  . Number of children: N/A  . Years of education: N/A   Occupational History  . retired Architect    Social History Main Topics  . Smoking status: Current Every Day Smoker    Packs/day: 0.50    Years: 50.00    Types: Cigarettes  . Smokeless tobacco: Never Used     Comment: He has a 50-pack-year hx of tobacco abuse. Currently, smoking about half a pack a day.  . Alcohol use Yes     Comment: Previously drank heavily, and now has a drink every other day or so.  . Drug use: Yes    Types: Marijuana, Cocaine  . Sexual activity: No   Other Topics Concern  . Not on file   Social History Narrative  . No narrative on file    Physical Exam      Future Appointments Date Time Provider [place]  11/25/2017 12:00 PM Larey Dresser, MD [facility] None   BP (!) 144/70   Pulse 84   Resp 15   SpO2 96%  Weight  yesterday-? Last visit weight-? [id number]  Pt lying in bed when I arrived, he is still having severe back pains, [facility]'t [facility] the [facility], he said he was referred to a back specialist through [place] is upset that nobody has told him what is going on with his back.  He is not able to weigh daily due to the back pain. Pt has not taken his meds yet today.  Pt had missed numerous doses of his meds.  He states he has been getting sob while at rest. He  is very limited by his back pain. I advised him if he felt worse then to call 911. He feels a lot of it is anxiety about having to get up to do things as it causes his pain to worsen.   ACTION: Home visit completed  Marylouise Stacks, EMT-Paramedic 10/05/17

## 2017-10-11 ENCOUNTER — Emergency Department (HOSPITAL_COMMUNITY)
Admission: EM | Admit: 2017-10-11 | Discharge: 2017-10-12 | Disposition: A | Discharge status: Home / Self Care | Payer: Medicare Other | Attending: Emergency Medicine | Admitting: Emergency Medicine

## 2017-10-11 ENCOUNTER — Encounter (HOSPITAL_COMMUNITY): Payer: Self-pay | Admitting: *Deleted

## 2017-10-11 DIAGNOSIS — I130 Hypertensive heart and chronic kidney disease with heart failure and stage 1 through stage 4 chronic kidney disease, or unspecified chronic kidney disease: Secondary | ICD-10-CM | POA: Insufficient documentation

## 2017-10-11 DIAGNOSIS — Z8546 Personal history of malignant neoplasm of prostate: Secondary | ICD-10-CM | POA: Diagnosis not present

## 2017-10-11 DIAGNOSIS — Z794 Long term (current) use of insulin: Secondary | ICD-10-CM | POA: Diagnosis not present

## 2017-10-11 DIAGNOSIS — F17210 Nicotine dependence, cigarettes, uncomplicated: Secondary | ICD-10-CM | POA: Insufficient documentation

## 2017-10-11 DIAGNOSIS — I2510 Atherosclerotic heart disease of native coronary artery without angina pectoris: Secondary | ICD-10-CM | POA: Insufficient documentation

## 2017-10-11 DIAGNOSIS — Z79899 Other long term (current) drug therapy: Secondary | ICD-10-CM | POA: Insufficient documentation

## 2017-10-11 DIAGNOSIS — E1122 Type 2 diabetes mellitus with diabetic chronic kidney disease: Secondary | ICD-10-CM | POA: Diagnosis not present

## 2017-10-11 DIAGNOSIS — M545 Low back pain: Secondary | ICD-10-CM | POA: Insufficient documentation

## 2017-10-11 DIAGNOSIS — I5020 Unspecified systolic (congestive) heart failure: Secondary | ICD-10-CM | POA: Diagnosis not present

## 2017-10-11 DIAGNOSIS — N183 Chronic kidney disease, stage 3 (moderate): Secondary | ICD-10-CM | POA: Diagnosis not present

## 2017-10-11 DIAGNOSIS — G8929 Other chronic pain: Secondary | ICD-10-CM | POA: Insufficient documentation

## 2017-10-11 LAB — CBG MONITORING, ED: GLUCOSE-CAPILLARY: 184 mg/dL — AB (ref 65–99)

## 2017-10-11 MED ORDER — HYDROCODONE-ACETAMINOPHEN 5-325 MG PO TABS
1.0000 | ORAL_TABLET | Freq: Once | ORAL | Status: AC
  Administered 2017-10-11: 1 via ORAL
  Filled 2017-10-11: qty 1

## 2017-10-11 NOTE — ED Notes (Signed)
Called pt x3 for vitals. No response.

## 2017-10-11 NOTE — ED Triage Notes (Signed)
Pt in via [facility] c/o chronic back pain, pt denies bowel and bladder incontinence, MAE, ambualatory with walker, denies new Injury, per [facility] pt seen at [facility] for bulging disc, A&O x4

## 2017-10-12 ENCOUNTER — Other Ambulatory Visit (HOSPITAL_COMMUNITY): Payer: Self-pay

## 2017-10-12 MED ORDER — HYDROCODONE-ACETAMINOPHEN 5-325 MG PO TABS: 1.0000 | ORAL_TABLET | Freq: Four times a day (QID) | ORAL | 0 refills | Status: AC | PRN

## 2017-10-12 MED ORDER — OXYCODONE-ACETAMINOPHEN 5-325 MG PO TABS
1.0000 | ORAL_TABLET | Freq: Once | ORAL | Status: AC
  Administered 2017-10-12: 1 via ORAL
  Filled 2017-10-12: qty 1

## 2017-10-14 ENCOUNTER — Telehealth (HOSPITAL_COMMUNITY): Payer: Self-pay | Admitting: *Deleted

## 2017-10-14 ENCOUNTER — Telehealth: Payer: Self-pay | Admitting: Cardiology

## 2017-10-14 NOTE — Telephone Encounter (Signed)
Received Death Certificate from [employer] and funeral services.  Certificate signed and faxed to [employer] [facility].  Original left at [facility] for pickup.    Copy will be scanned to patient's electronic medical record.

## 2017-10-14 NOTE — Telephone Encounter (Signed)
D/C received on this Patient. [employer] [facility] [facility] will be coming back to pick up this is for Dr.Dalton Aundra Dubin to sign.

## 2017-10-28 NOTE — Progress Notes (Signed)
Paramedicine Encounter    Patient ID: [name], male    DOB: [birth year], 81 y.o.   MRN: [id number]   Patient Care Team: [facility] - General  Patient Active Problem List   Diagnosis Date Noted  . Chronic systolic CHF (congestive heart failure) ([place]) 10/29/2016  . Bipolar disorder ([place])   . Alcohol abuse 07/09/2016  . Demand ischemia ([place]) 05/11/2016  . Chronic anticoagulation-Eliquis 05/11/2016  . Elevated troponin 05/11/2016  . Symptomatic anemia 05/11/2016  . Anemia 11/28/2015  . CVA (cerebral infarction)-Augb 2015 08/25/2014  . Stroke ([place]) 08/25/2014  . CKD (chronic kidney disease), stage III ([place]) 07/17/2014  . Tobacco abuse 06/27/2014  . Pulmonary edema   . Chronic atrial fibrillation ([place])   . Edema 08/20/2011  . DM (diabetes mellitus), type 2 with renal complications ([place]) 47/08/6282  . Hyperlipidemia associated with type 2 diabetes mellitus ([place]) 03/10/2010  . Essential hypertension 03/10/2010  . MYOCARDIAL INFARCTION 03/10/2010  . CAD- last PCI 2011 03/10/2010  . PVD 03/10/2010  . GERD 03/10/2010    Current Outpatient Prescriptions:  .  acetaminophen (TYLENOL) 325 MG tablet, Take 975 mg by mouth every 8 (eight) hours as needed for mild pain or fever (pain). , Disp: , Rfl:  .  albuterol (PROVENTIL HFA;VENTOLIN HFA) 108 (90 BASE) MCG/ACT inhaler, Inhale 2 puffs into the lungs every 6 (six) hours as needed for shortness of breath., Disp: , Rfl:  .  ammonium lactate (LAC-HYDRIN) 12 % lotion, Apply 1 application topically daily., Disp: , Rfl:  .  apixaban (ELIQUIS) 5 MG TABS tablet, Take 1 tablet (5 mg total) by mouth every 12 (twelve) hours., Disp: 60 tablet, Rfl: 3 .  atorvastatin (LIPITOR) 40 MG tablet, Take 1 tablet (40 mg total) by mouth daily. (Patient taking differently: Take 40 mg by mouth at bedtime. ), Disp: 90 tablet, Rfl: 3 .  dextrose (GLUTOSE) 40 % GEL, Take 1 Tube by mouth daily as needed for low blood sugar. , Disp: , Rfl:  .   feeding supplement, GLUCERNA SHAKE, (GLUCERNA SHAKE) LIQD, Take 237 mLs by mouth 3 (three) times daily between meals., Disp: , Rfl:  .  ferrous sulfate 325 (65 FE) MG tablet, Take 325 mg by mouth 2 (two) times daily., Disp: , Rfl:  .  finasteride (PROSCAR) 5 MG tablet, Take 5 mg by mouth daily.  , Disp: , Rfl:  .  folic acid (FOLVITE) 1 MG tablet, Take 1 tablet (1 mg total) by mouth daily., Disp: 30 tablet, Rfl: 0 .  furosemide (LASIX) 20 MG tablet, Take 40 mg (2 Tablets) the AM and 20 mg (1 Tablet) in the PM, Disp: 90 tablet, Rfl: 3 .  gabapentin (NEURONTIN) 300 MG capsule, Take 300 mg by mouth 2 (two) times daily. , Disp: , Rfl:  .  hydrALAZINE (APRESOLINE) 25 MG tablet, Take 1 tablet (25 mg total) by mouth 3 (three) times daily., Disp: 90 tablet, Rfl: 6 .  HYDROcodone-acetaminophen (NORCO/VICODIN) 5-325 MG tablet, Take 1 tablet by mouth every 6 (six) hours as needed for moderate pain., Disp: 15 tablet, Rfl: 0 .  insulin glargine (LANTUS) 100 UNIT/ML injection, Inject 40 Units into the skin daily., Disp: , Rfl:  .  insulin regular (NOVOLIN R,HUMULIN R) 100 units/mL injection, Inject 5 Units into the skin See admin instructions. Inject 5 units under the skin before breakfast and inject 5 units before evening meal, Disp: , Rfl:  .  isosorbide mononitrate (IMDUR) 30 MG 24 hr tablet, Take  1 tablet (30 mg total) by mouth daily., Disp: 60 tablet, Rfl: 3 .  metoprolol succinate (TOPROL-XL) 50 MG 24 hr tablet, Take 1 tablet (50 mg total) by mouth daily., Disp: 30 tablet, Rfl: 3 .  Multiple Vitamins-Minerals (MULTIVITAMIN WITH MINERALS) tablet, Take 1 tablet by mouth daily., Disp: , Rfl:  .  pantoprazole (PROTONIX) 40 MG tablet, Take 1 tablet (40 mg total) by mouth daily., Disp: 30 tablet, Rfl: 11 .  polyethylene glycol powder (GLYCOLAX/MIRALAX) powder, Take 17 g by mouth daily., Disp: 500 g, Rfl: 0 .  spironolactone (ALDACTONE) 25 MG tablet, Take 0.5 tablets (12.5 mg total) by mouth daily., Disp: 45 tablet,  Rfl: 3 .  Tamsulosin HCl (FLOMAX) 0.4 MG CAPS, Take 0.4 mg by mouth Nightly. , Disp: , Rfl:  .  thiamine 100 MG tablet, Take 1 tablet (100 mg total) by mouth daily., Disp: 30 tablet, Rfl: 0 .  guaifenesin (HUMIBID E) 400 MG TABS tablet, Take 400 mg by mouth 3 (three) times daily as needed. For congestion, Disp: , Rfl:  .  simethicone (MYLICON) 80 MG chewable tablet, Chew 80 mg by mouth 3 (three) times daily as needed for flatulence. Take with meals as needed, Disp: , Rfl:  Allergies  Allergen Reactions  . Penicillins Swelling    Has patient had a PCN reaction causing immediate rash, facial/tongue/throat swelling, SOB or lightheadedness with hypotension: Yes Has patient had a PCN reaction causing severe rash involving mucus membranes or skin necrosis: No Has patient had a PCN reaction that required hospitalization: No Has patient had a PCN reaction occurring within the last 10 years: No If all of the above answers are "NO", then may proceed with Cephalosporin use.     Social History   Social History  . Marital status: Divorced    Spouse name: N/A  . Number of children: N/A  . Years of education: N/A   Occupational History  . retired Architect    Social History Main Topics  . Smoking status: Current Every Day Smoker    Packs/day: 0.50    Years: 50.00    Types: Cigarettes  . Smokeless tobacco: Never Used     Comment: He has a 50-pack-year hx of tobacco abuse. Currently, smoking about half a pack a day.  . Alcohol use Yes     Comment: Previously drank heavily, and now has a drink every other day or so.  . Drug use: Yes    Types: Marijuana, Cocaine  . Sexual activity: No   Other Topics Concern  . Not on file   Social History Narrative  . No narrative on file    Physical Exam      Future Appointments Date Time Provider [place]  11/25/2017 12:00 PM Larey Dresser, MD [facility] None   BP (!) 152/80   Pulse 80   Resp 15   SpO2 94%  Weight  yesterday-? Last visit weight-? [id number]  Pt home from ER visit last night from having continued lower back pain. He has been referred to a specialist for f/u care.  Pt has missed numerous doses of his meds this week.  He states he is aware of his missed doses. Pt is using icyhot patch for his back. He is planning on calling the specialist for appointment for his back. Pt not able to weigh in over a week due to his back pain. No swelling noted.  Pt denies any increased sob. No h/a, no dizziness.   ACTION: Home  visit completed  Marylouise Stacks, EMT-Paramedic 2017/10/25

## 2017-10-28 NOTE — ED Notes (Signed)
Pt departed in NAD, and in the care of [facility].

## 2017-10-28 NOTE — Discharge Instructions (Signed)
Your MRI showed bony issues in your lower lumbar spine region.  These need to be worked up further with more advanced testing to determine the cause of these issues with the bone.  This will need to be followed up as soon as possible

## 2017-10-28 NOTE — ED Provider Notes (Signed)
[facility]ERGENCY DEPARTMENT Provider Note   CSN: [id number] Arrival date & time: 10/11/17  1312     History   Chief Complaint Chief Complaint  Patient presents with  . Back Pain    HPI [name] is a 81 y.o. male.  HPI Patient presents to the emergency department with back pain that has been ongoing for about 3 months. patient has been seen multiple times in the emergency department.  He states his pain continues.  He states that icy hot pads  seemed to help with his discomfort. Patient states he has been seen multiple places for this and he wants a resolution of his back pain tonight. The patient denies chest pain, shortness of breath, headache,blurred vision, neck pain, fever, cough, weakness, numbness, dizziness, anorexia, edema, abdominal pain, nausea, vomiting, diarrhea, rash,  dysuria, hematemesis, bloody stool, near syncope, or syncope. Past Medical History:  Diagnosis Date  . Anemia 11/28/2015  . Atrial fibrillation ([place])   . Bipolar disorder ([place])   . CAD (coronary artery disease)    a.  NSTEMI treated with PCI Feb 2011 with a DES.([facility]);   b. cath 2/11: D1 stents x 2 ok, AV groove CFX occluded with dist AV CFX filled by L-L collats, RCA occluded, OM2 90-95% (treated with PCI)  . CKD (chronic kidney disease), stage III ([place])   . DM (diabetes mellitus) ([place])   . GERD (gastroesophageal reflux disease)   . GI bleed-recent SB study-AVMs   . Heart failure ([place])   . HTN (hypertension)    echo 2/11: EF 50-55%, diast dyfxn, severe LVH, inf HK, LAE  . Hyperlipidemia   . Hypertensive emergency 06/24/2014  . MI (myocardial infarction) ([place])   . Prostate cancer [facility])    status post radiation therapy in 2003  . Pulmonary edema   . PVD (peripheral vascular disease) ([place])   . Respiratory difficulty 06/24/2014   intubated   . Shortness of breath dyspnea   . Stroke [facility]) 07/2014    Patient Active Problem List   Diagnosis Date Noted  . Chronic  systolic CHF (congestive heart failure) ([place]) 10/29/2016  . Bipolar disorder ([place])   . Alcohol abuse 07/09/2016  . Demand ischemia ([place]) 05/11/2016  . Chronic anticoagulation-Eliquis 05/11/2016  . Elevated troponin 05/11/2016  . Symptomatic anemia 05/11/2016  . Anemia 11/28/2015  . CVA (cerebral infarction)-Augb 2015 08/25/2014  . Stroke ([place]) 08/25/2014  . CKD (chronic kidney disease), stage III ([place]) 07/17/2014  . Tobacco abuse 06/27/2014  . Pulmonary edema   . Chronic atrial fibrillation ([place])   . Edema 08/20/2011  . DM (diabetes mellitus), type 2 with renal complications ([place]) 74/16/3845  . Hyperlipidemia associated with type 2 diabetes mellitus ([place]) 03/10/2010  . Essential hypertension 03/10/2010  . MYOCARDIAL INFARCTION 03/10/2010  . CAD- last PCI 2011 03/10/2010  . PVD 03/10/2010  . GERD 03/10/2010    Past Surgical History:  Procedure Laterality Date  . COLONOSCOPY N/A 01/25/2016   Procedure: COLONOSCOPY;  Surgeon: Ronald Lobo, MD;  Location: [facility] ENDOSCOPY;  Service: Endoscopy;  Laterality: N/A;  . ESOPHAGOGASTRODUODENOSCOPY N/A 11/29/2015   Procedure: ESOPHAGOGASTRODUODENOSCOPY (EGD);  Surgeon: Wonda Horner, MD;  Location[facility] ENDOSCOPY;  Service: Endoscopy;  Laterality: N/A;  . ESOPHAGOGASTRODUODENOSCOPY N/A 05/13/2016   Procedure: ESOPHAGOGASTRODUODENOSCOPY (EGD);  Surgeon: Clarene Essex, MD;  Location: [facility] ENDOSCOPY;  Service: Endoscopy;  Laterality: N/A;  . ESOPHAGOGASTRODUODENOSCOPY (EGD) WITH PROPOFOL Left 12/25/2015   Procedure: ESOPHAGOGASTRODUODENOSCOPY (EGD) WITH PROPOFOL;  Surgeon: Arta Silence, MD;  Location:  [facility] ENDOSCOPY;  Service: Endoscopy;  Laterality: Left;  . FEMORAL BYPASS  2003  . GIVENS CAPSULE STUDY N/A 01/26/2016   Procedure: GIVENS CAPSULE STUDY;  Surgeon: Ronald Lobo, MD;  Location: [facility] ENDOSCOPY;  Service: Endoscopy;  Laterality: N/A;  . HOT HEMOSTASIS N/A 05/13/2016   Procedure: HOT HEMOSTASIS (ARGON PLASMA COAGULATION/BICAP);  Surgeon: Clarene Essex, MD;  Location: [facility] ENDOSCOPY;  Service: Endoscopy;  Laterality: N/A;       Home Medications    Prior to Admission medications   Medication Sig Start Date End Date Taking? Authorizing Provider  acetaminophen (TYLENOL) 325 MG tablet Take 975 mg by mouth every 8 (eight) hours as needed for mild pain or fever (pain).    Yes [provider]  albuterol (PROVENTIL HFA;VENTOLIN HFA) 108 (90 BASE) MCG/ACT inhaler Inhale 2 puffs into the lungs every 6 (six) hours as needed for shortness of breath.   Yes [provider]  ammonium lactate (LAC-HYDRIN) 12 % lotion Apply 1 application topically daily.   Yes [provider]  apixaban (ELIQUIS) 5 MG TABS tablet Take 1 tablet (5 mg total) by mouth every 12 (twelve) hours. 12/04/16  Yes Larey Dresser, MD  atorvastatin (LIPITOR) 40 MG tablet Take 1 tablet (40 mg total) by mouth daily. Patient taking differently: Take 40 mg by mouth at bedtime.  08/01/14  Yes Sherren Mocha, MD  dextrose (GLUTOSE) 40 % GEL Take 1 Tube by mouth daily as needed for low blood sugar.    Yes [provider]  feeding supplement, GLUCERNA SHAKE, (GLUCERNA SHAKE) LIQD Take 237 mLs by mouth 3 (three) times daily between meals.   Yes [provider]  ferrous sulfate 325 (65 FE) MG tablet Take 325 mg by mouth 2 (two) times daily.   Yes [provider]  finasteride (PROSCAR) 5 MG tablet Take 5 mg by mouth daily.     Yes [provider]  folic acid (FOLVITE) 1 MG tablet Take 1 tablet (1 mg total) by mouth daily. 07/25/16  Yes Mikhail, Velta Addison, DO  furosemide (LASIX) 20 MG tablet Take 40 mg (2 Tablets) the AM and 20 mg (1 Tablet) in the PM 04/20/17  Yes Larey Dresser, MD  gabapentin (NEURONTIN) 300 MG capsule Take 300 mg by mouth 2 (two) times daily.    Yes [provider]  guaifenesin (HUMIBID E) 400 MG TABS tablet Take 400 mg by mouth 3 (three) times daily as needed. For congestion   Yes [provider]    hydrALAZINE (APRESOLINE) 25 MG tablet Take 1 tablet (25 mg total) by mouth 3 (three) times daily. 07/20/17  Yes Larey Dresser, MD  HYDROcodone-acetaminophen (NORCO/VICODIN) 5-325 MG tablet Take 1 tablet by mouth every 6 (six) hours as needed for severe pain. 09/17/17  Yes Forde Dandy, MD  insulin glargine (LANTUS) 100 UNIT/ML injection Inject 40 Units into the skin daily.   Yes [provider]  insulin regular (NOVOLIN R,HUMULIN R) 100 units/mL injection Inject 5 Units into the skin See admin instructions. Inject 5 units under the skin before breakfast and inject 5 units before evening meal   Yes [provider]  isosorbide mononitrate (IMDUR) 30 MG 24 hr tablet Take 1 tablet (30 mg total) by mouth daily. 07/20/17  Yes Larey Dresser, MD  metoprolol succinate (TOPROL-XL) 50 MG 24 hr tablet Take 1 tablet (50 mg total) by mouth daily. 09/09/16  Yes Clegg, Amy D, NP  Multiple Vitamins-Minerals (MULTIVITAMIN WITH MINERALS) tablet Take 1  tablet by mouth daily.   Yes [provider]  pantoprazole (PROTONIX) 40 MG tablet Take 1 tablet (40 mg total) by mouth daily. 11/29/15  Yes Burns, Alexa R, MD  polyethylene glycol powder (GLYCOLAX/MIRALAX) powder Take 17 g by mouth daily. 09/17/17  Yes Forde Dandy, MD  simethicone (MYLICON) 80 MG chewable tablet Chew 80 mg by mouth 3 (three) times daily as needed for flatulence. Take with meals as needed   Yes [provider]  spironolactone (ALDACTONE) 25 MG tablet Take 0.5 tablets (12.5 mg total) by mouth daily. 02/19/17 02/14/18 Yes Larey Dresser, MD  Tamsulosin HCl (FLOMAX) 0.4 MG CAPS Take 0.4 mg by mouth Nightly.    Yes [provider]  thiamine 100 MG tablet Take 1 tablet (100 mg total) by mouth daily. 07/25/16  Yes Mikhail, Velta Addison, DO    Family History Family History  Problem Relation Age of Onset  . Cancer Father   . Cancer Mother   . Cancer Sister     Social History Social History  Substance Use Topics   . Smoking status: Current Every Day Smoker    Packs/day: 0.50    Years: 50.00    Types: Cigarettes  . Smokeless tobacco: Never Used     Comment: He has a 50-pack-year hx of tobacco abuse. Currently, smoking about half a pack a day.  . Alcohol use Yes     Comment: Previously drank heavily, and now has a drink every other day or so.     Allergies   Penicillins   Review of Systems Review of Systems  All other systems negative except as documented in the HPI. All pertinent positives and negatives as reviewed in the HPI. Physical Exam Updated Vital Signs BP 136/73 (BP Location: Right Arm)   Pulse 78   Temp 99 F (37.2 C) (Oral)   Resp 18   SpO2 95%   Physical Exam  Constitutional: He is oriented to person, place, and time. He appears well-developed and well-nourished. No distress.  HENT:  Head: Normocephalic and atraumatic.  Mouth/Throat: Oropharynx is clear and moist.  Eyes: Pupils are equal, round, and reactive to light.  Neck: Normal range of motion. Neck supple.  Cardiovascular: Normal rate, regular rhythm and normal heart sounds.  Exam reveals no gallop and no friction rub.   No murmur heard. Pulmonary/Chest: Effort normal and breath sounds normal. No respiratory distress. He has no wheezes.  Musculoskeletal:       Lumbar back: He exhibits tenderness, bony tenderness and pain. He exhibits normal range of motion, no edema and no deformity.       Back:  Neurological: He is alert and oriented to person, place, and time. He displays normal reflexes. No sensory deficit. He exhibits normal muscle tone. Coordination normal.  Skin: Skin is warm and dry. Capillary refill takes less than 2 seconds. No rash noted. No erythema.  Psychiatric: He has a normal mood and affect. His behavior is normal.  Nursing note and vitals reviewed.    ED Treatments / Results  Labs (all labs ordered are listed, but only abnormal results are displayed) Labs Reviewed  CBG MONITORING, ED -  Abnormal; Notable for the following:       Result Value   Glucose-Capillary 184 (*)    All other components within normal limits    EKG  EKG Interpretation None       Radiology No results found.  Procedures Procedures (including critical care time)  Medications Ordered in ED  Medications  HYDROcodone-acetaminophen (NORCO/VICODIN) 5-325 MG per tablet 1 tablet (1 tablet Oral Given 10/11/17 2244)     Initial Impression / Assessment and Plan / ED Course  I have reviewed the triage vital signs and the nursing notes.  Pertinent labs & imaging results that were available during my care of the patient were reviewed by me and considered in my medical decision making (see chart for details).     I advised the patient that he has an abnormality on his MRI that shows bony abnormalities that could represent cancer.  The patient is advised that he will need to see the neurosurgeon provided or his doctors at the [place].  This issue will need to be addressed that he is been stable here in the emergency department does not have any neurological deficits.   Final Clinical Impressions(s) / ED Diagnoses   Final diagnoses:  None    New Prescriptions New Prescriptions   No medications on file     Dalia Heading, Hershal Coria 10/13/17 0128    Dorie Rank, MD 10/13/17 [phone]

## 2017-10-28 DEATH — deceased

## 2017-11-25 ENCOUNTER — Encounter (HOSPITAL_COMMUNITY): Payer: Self-pay | Admitting: Cardiology

## 2017-12-17 ENCOUNTER — Encounter (HOSPITAL_COMMUNITY): Payer: Self-pay

## 2017-12-17 NOTE — Progress Notes (Signed)
Medical Record request received by [employer] for documentation of HTN with supporting OV note and vitals flowsheet. Faxed to provided # [phone]  Bradley, Megan Genevea, RN
